# Patient Record
Sex: Female | Born: 1955
Health system: Southern US, Community
[De-identification: ages and names within clinical notes are randomized; demographics above are authoritative.]

## PROBLEM LIST (undated history)

## (undated) DIAGNOSIS — Z8739 Personal history of other diseases of the musculoskeletal system and connective tissue: Secondary | ICD-10-CM

## (undated) DIAGNOSIS — I110 Hypertensive heart disease with heart failure: Secondary | ICD-10-CM

## (undated) DIAGNOSIS — J45909 Unspecified asthma, uncomplicated: Secondary | ICD-10-CM

## (undated) DIAGNOSIS — I471 Supraventricular tachycardia, unspecified: Secondary | ICD-10-CM

## (undated) DIAGNOSIS — K648 Other hemorrhoids: Secondary | ICD-10-CM

## (undated) DIAGNOSIS — G4733 Obstructive sleep apnea (adult) (pediatric): Secondary | ICD-10-CM

## (undated) DIAGNOSIS — R0600 Dyspnea, unspecified: Secondary | ICD-10-CM

## (undated) DIAGNOSIS — E119 Type 2 diabetes mellitus without complications: Secondary | ICD-10-CM

## (undated) DIAGNOSIS — M1990 Unspecified osteoarthritis, unspecified site: Secondary | ICD-10-CM

## (undated) DIAGNOSIS — I517 Cardiomegaly: Secondary | ICD-10-CM

## (undated) DIAGNOSIS — I2510 Atherosclerotic heart disease of native coronary artery without angina pectoris: Secondary | ICD-10-CM

## (undated) DIAGNOSIS — N200 Calculus of kidney: Secondary | ICD-10-CM

## (undated) DIAGNOSIS — I272 Pulmonary hypertension, unspecified: Secondary | ICD-10-CM

## (undated) DIAGNOSIS — F419 Anxiety disorder, unspecified: Secondary | ICD-10-CM

## (undated) DIAGNOSIS — G9600 Cerebrospinal fluid leak, unspecified: Secondary | ICD-10-CM

## (undated) DIAGNOSIS — I5032 Chronic diastolic (congestive) heart failure: Secondary | ICD-10-CM

## (undated) DIAGNOSIS — J449 Chronic obstructive pulmonary disease, unspecified: Secondary | ICD-10-CM

## (undated) DIAGNOSIS — E785 Hyperlipidemia, unspecified: Secondary | ICD-10-CM

## (undated) DIAGNOSIS — F40240 Claustrophobia: Secondary | ICD-10-CM

## (undated) DIAGNOSIS — I779 Disorder of arteries and arterioles, unspecified: Secondary | ICD-10-CM

## (undated) DIAGNOSIS — I739 Peripheral vascular disease, unspecified: Secondary | ICD-10-CM

## (undated) DIAGNOSIS — K5790 Diverticulosis of intestine, part unspecified, without perforation or abscess without bleeding: Secondary | ICD-10-CM

## (undated) DIAGNOSIS — Z8673 Personal history of transient ischemic attack (TIA), and cerebral infarction without residual deficits: Secondary | ICD-10-CM

## (undated) HISTORY — DX: Peripheral vascular disease, unspecified: I73.9

## (undated) HISTORY — DX: Other hemorrhoids: K64.8

## (undated) HISTORY — DX: Supraventricular tachycardia: I47.1

## (undated) HISTORY — DX: Disorder of arteries and arterioles, unspecified: I77.9

## (undated) HISTORY — DX: Hypertensive heart disease with heart failure: I11.0

## (undated) HISTORY — DX: Chronic diastolic (congestive) heart failure: I50.32

## (undated) HISTORY — DX: Supraventricular tachycardia, unspecified: I47.10

## (undated) HISTORY — DX: Pulmonary hypertension, unspecified: I27.20

## (undated) HISTORY — DX: Diverticulosis of intestine, part unspecified, without perforation or abscess without bleeding: K57.90

## (undated) HISTORY — DX: Atherosclerotic heart disease of native coronary artery without angina pectoris: I25.10

## (undated) HISTORY — DX: Cardiomegaly: I51.7

## (undated) HISTORY — DX: Cerebrospinal fluid leak, unspecified: G96.00

## (undated) HISTORY — DX: Cerebrospinal fluid leak: G96.0

## (undated) HISTORY — DX: Personal history of transient ischemic attack (TIA), and cerebral infarction without residual deficits: Z86.73

## (undated) HISTORY — PX: REFRACTIVE SURGERY: SHX103

---

## 1992-12-08 HISTORY — PX: TOTAL ABDOMINAL HYSTERECTOMY: SHX209

## 2000-12-17 ENCOUNTER — Encounter: Payer: Self-pay | Admitting: Emergency Medicine

## 2000-12-17 ENCOUNTER — Emergency Department (HOSPITAL_COMMUNITY): Admission: EM | Admit: 2000-12-17 | Discharge: 2000-12-17 | Payer: Self-pay | Admitting: Emergency Medicine

## 2002-07-20 ENCOUNTER — Encounter: Payer: Self-pay | Admitting: Emergency Medicine

## 2002-07-20 ENCOUNTER — Emergency Department (HOSPITAL_COMMUNITY): Admission: EM | Admit: 2002-07-20 | Discharge: 2002-07-20 | Payer: Self-pay | Admitting: Emergency Medicine

## 2004-03-08 DIAGNOSIS — Z8673 Personal history of transient ischemic attack (TIA), and cerebral infarction without residual deficits: Secondary | ICD-10-CM

## 2004-03-08 HISTORY — DX: Personal history of transient ischemic attack (TIA), and cerebral infarction without residual deficits: Z86.73

## 2004-04-01 ENCOUNTER — Inpatient Hospital Stay (HOSPITAL_COMMUNITY): Admission: EM | Admit: 2004-04-01 | Discharge: 2004-04-03 | Payer: Self-pay | Admitting: Neurology

## 2004-04-02 ENCOUNTER — Encounter (INDEPENDENT_AMBULATORY_CARE_PROVIDER_SITE_OTHER): Payer: Self-pay | Admitting: Cardiology

## 2004-04-22 ENCOUNTER — Encounter: Admission: RE | Admit: 2004-04-22 | Discharge: 2004-07-21 | Payer: Self-pay | Admitting: Neurology

## 2004-04-23 ENCOUNTER — Emergency Department (HOSPITAL_COMMUNITY): Admission: EM | Admit: 2004-04-23 | Discharge: 2004-04-23 | Payer: Self-pay | Admitting: Family Medicine

## 2004-07-26 ENCOUNTER — Inpatient Hospital Stay (HOSPITAL_COMMUNITY): Admission: AD | Admit: 2004-07-26 | Discharge: 2004-07-29 | Payer: Self-pay | Admitting: Family Medicine

## 2004-12-11 ENCOUNTER — Ambulatory Visit: Payer: Self-pay | Admitting: Family Medicine

## 2005-01-07 ENCOUNTER — Ambulatory Visit: Payer: Self-pay | Admitting: Family Medicine

## 2006-08-05 ENCOUNTER — Ambulatory Visit: Payer: Self-pay | Admitting: Internal Medicine

## 2006-08-07 ENCOUNTER — Ambulatory Visit: Payer: Self-pay | Admitting: Hospitalist

## 2006-08-19 ENCOUNTER — Ambulatory Visit: Payer: Self-pay | Admitting: Internal Medicine

## 2006-09-10 ENCOUNTER — Ambulatory Visit: Payer: Self-pay | Admitting: Internal Medicine

## 2008-12-18 ENCOUNTER — Inpatient Hospital Stay (HOSPITAL_COMMUNITY): Admission: EM | Admit: 2008-12-18 | Discharge: 2008-12-23 | Payer: Self-pay | Admitting: Emergency Medicine

## 2010-03-05 ENCOUNTER — Emergency Department (HOSPITAL_COMMUNITY): Admission: EM | Admit: 2010-03-05 | Discharge: 2010-03-05 | Payer: Self-pay | Admitting: Emergency Medicine

## 2010-07-04 ENCOUNTER — Inpatient Hospital Stay (HOSPITAL_COMMUNITY): Admission: EM | Admit: 2010-07-04 | Discharge: 2010-07-10 | Payer: Self-pay | Admitting: Emergency Medicine

## 2010-07-04 ENCOUNTER — Ambulatory Visit: Payer: Self-pay | Admitting: Cardiovascular Disease

## 2010-07-05 ENCOUNTER — Encounter (INDEPENDENT_AMBULATORY_CARE_PROVIDER_SITE_OTHER): Payer: Self-pay | Admitting: Family Medicine

## 2011-02-21 LAB — BASIC METABOLIC PANEL
BUN: 9 mg/dL (ref 6–23)
CO2: 36 mEq/L — ABNORMAL HIGH (ref 19–32)
Calcium: 8.7 mg/dL (ref 8.4–10.5)
Chloride: 104 mEq/L (ref 96–112)
Creatinine, Ser: 1.19 mg/dL (ref 0.4–1.2)
GFR calc Af Amer: 57 mL/min — ABNORMAL LOW (ref >60)
GFR calc non Af Amer: 47 mL/min — ABNORMAL LOW (ref >60)
Glucose, Bld: 109 mg/dL — ABNORMAL HIGH (ref 70–99)
Potassium: 3.9 mEq/L (ref 3.5–5.1)
Sodium: 146 mEq/L — ABNORMAL HIGH (ref 135–145)

## 2011-02-21 LAB — CLOSTRIDIUM DIFFICILE EIA: C difficile Toxins A+B, EIA: NEGATIVE

## 2011-02-22 LAB — GIARDIA/CRYPTOSPORIDIUM SCREEN(EIA)
Cryptosporidium Screen (EIA): NEGATIVE
Giardia Screen - EIA: NEGATIVE

## 2011-02-22 LAB — COMPREHENSIVE METABOLIC PANEL
ALT: 12 U/L (ref 0–35)
ALT: 14 U/L (ref 0–35)
ALT: 21 U/L (ref 0–35)
AST: 22 U/L (ref 0–37)
AST: 22 U/L (ref 0–37)
AST: 31 U/L (ref 0–37)
Albumin: 3 g/dL — ABNORMAL LOW (ref 3.5–5.2)
Albumin: 3.3 g/dL — ABNORMAL LOW (ref 3.5–5.2)
Albumin: 3.8 g/dL (ref 3.5–5.2)
Alkaline Phosphatase: 63 U/L (ref 39–117)
Alkaline Phosphatase: 86 U/L (ref 39–117)
Alkaline Phosphatase: 88 U/L (ref 39–117)
BUN: 13 mg/dL (ref 6–23)
BUN: 14 mg/dL (ref 6–23)
BUN: 15 mg/dL (ref 6–23)
CO2: 26 mEq/L (ref 19–32)
CO2: 26 mEq/L (ref 19–32)
CO2: 28 mEq/L (ref 19–32)
Calcium: 7.6 mg/dL — ABNORMAL LOW (ref 8.4–10.5)
Calcium: 8.3 mg/dL — ABNORMAL LOW (ref 8.4–10.5)
Calcium: 8.7 mg/dL (ref 8.4–10.5)
Chloride: 104 mEq/L (ref 96–112)
Chloride: 104 mEq/L (ref 96–112)
Chloride: 99 mEq/L (ref 96–112)
Creatinine, Ser: 1.4 mg/dL — ABNORMAL HIGH (ref 0.4–1.2)
Creatinine, Ser: 1.5 mg/dL — ABNORMAL HIGH (ref 0.4–1.2)
Creatinine, Ser: 1.7 mg/dL — ABNORMAL HIGH (ref 0.4–1.2)
GFR calc Af Amer: 38 mL/min — ABNORMAL LOW (ref >60)
GFR calc Af Amer: 44 mL/min — ABNORMAL LOW (ref >60)
GFR calc Af Amer: 48 mL/min — ABNORMAL LOW (ref >60)
GFR calc non Af Amer: 31 mL/min — ABNORMAL LOW (ref >60)
GFR calc non Af Amer: 36 mL/min — ABNORMAL LOW (ref >60)
GFR calc non Af Amer: 39 mL/min — ABNORMAL LOW (ref >60)
Glucose, Bld: 108 mg/dL — ABNORMAL HIGH (ref 70–99)
Glucose, Bld: 124 mg/dL — ABNORMAL HIGH (ref 70–99)
Glucose, Bld: 153 mg/dL — ABNORMAL HIGH (ref 70–99)
Potassium: 3.5 mEq/L (ref 3.5–5.1)
Potassium: 4.6 mEq/L (ref 3.5–5.1)
Potassium: 4.7 mEq/L (ref 3.5–5.1)
Sodium: 135 mEq/L (ref 135–145)
Sodium: 137 mEq/L (ref 135–145)
Sodium: 137 mEq/L (ref 135–145)
Total Bilirubin: 1.2 mg/dL (ref 0.3–1.2)
Total Bilirubin: 1.2 mg/dL (ref 0.3–1.2)
Total Bilirubin: 1.7 mg/dL — ABNORMAL HIGH (ref 0.3–1.2)
Total Protein: 5.6 g/dL — ABNORMAL LOW (ref 6.0–8.3)
Total Protein: 6.4 g/dL (ref 6.0–8.3)
Total Protein: 7.1 g/dL (ref 6.0–8.3)

## 2011-02-22 LAB — CBC
HCT: 40.4 % (ref 36.0–46.0)
HCT: 43.2 % (ref 36.0–46.0)
HCT: 43.3 % (ref 36.0–46.0)
HCT: 48.1 % — ABNORMAL HIGH (ref 36.0–46.0)
Hemoglobin: 14 g/dL (ref 12.0–15.0)
Hemoglobin: 14.6 g/dL (ref 12.0–15.0)
Hemoglobin: 14.9 g/dL (ref 12.0–15.0)
Hemoglobin: 16.6 g/dL — ABNORMAL HIGH (ref 12.0–15.0)
MCH: 32.3 pg (ref 26.0–34.0)
MCH: 32.3 pg (ref 26.0–34.0)
MCH: 32.4 pg (ref 26.0–34.0)
MCH: 32.6 pg (ref 26.0–34.0)
MCHC: 33.9 g/dL (ref 30.0–36.0)
MCHC: 34.4 g/dL (ref 30.0–36.0)
MCHC: 34.5 g/dL (ref 30.0–36.0)
MCHC: 34.5 g/dL (ref 30.0–36.0)
MCV: 93.5 fL (ref 78.0–100.0)
MCV: 94.1 fL (ref 78.0–100.0)
MCV: 94.4 fL (ref 78.0–100.0)
MCV: 95.7 fL (ref 78.0–100.0)
Platelets: 198 10*3/uL (ref 150–400)
Platelets: 220 10*3/uL (ref 150–400)
Platelets: 229 10*3/uL (ref 150–400)
Platelets: 241 10*3/uL (ref 150–400)
RBC: 4.28 MIL/uL (ref 3.87–5.11)
RBC: 4.51 MIL/uL (ref 3.87–5.11)
RBC: 4.6 MIL/uL (ref 3.87–5.11)
RBC: 5.14 MIL/uL — ABNORMAL HIGH (ref 3.87–5.11)
RDW: 14.8 % (ref 11.5–15.5)
RDW: 14.9 % (ref 11.5–15.5)
RDW: 15.2 % (ref 11.5–15.5)
RDW: 15.5 % (ref 11.5–15.5)
WBC: 10.7 10*3/uL — ABNORMAL HIGH (ref 4.0–10.5)
WBC: 11 10*3/uL — ABNORMAL HIGH (ref 4.0–10.5)
WBC: 11.4 10*3/uL — ABNORMAL HIGH (ref 4.0–10.5)
WBC: 15.6 10*3/uL — ABNORMAL HIGH (ref 4.0–10.5)

## 2011-02-22 LAB — URINALYSIS, ROUTINE W REFLEX MICROSCOPIC
Glucose, UA: NEGATIVE mg/dL
Glucose, UA: NEGATIVE mg/dL
Hgb urine dipstick: NEGATIVE
Hgb urine dipstick: NEGATIVE
Ketones, ur: 15 mg/dL — AB
Ketones, ur: NEGATIVE mg/dL
Nitrite: NEGATIVE
Nitrite: NEGATIVE
Protein, ur: 100 mg/dL — AB
Protein, ur: 30 mg/dL — AB
Specific Gravity, Urine: 1.028 (ref 1.005–1.030)
Specific Gravity, Urine: 1.032 — ABNORMAL HIGH (ref 1.005–1.030)
Urobilinogen, UA: 1 mg/dL (ref 0.0–1.0)
Urobilinogen, UA: 2 mg/dL — ABNORMAL HIGH (ref 0.0–1.0)
pH: 5 (ref 5.0–8.0)
pH: 5 (ref 5.0–8.0)

## 2011-02-22 LAB — BRAIN NATRIURETIC PEPTIDE: Pro B Natriuretic peptide (BNP): 134 pg/mL — ABNORMAL HIGH (ref 0.0–100.0)

## 2011-02-22 LAB — CARDIAC PANEL(CRET KIN+CKTOT+MB+TROPI)
CK, MB: 3.3 ng/mL (ref 0.3–4.0)
CK, MB: 3.5 ng/mL (ref 0.3–4.0)
CK, MB: 3.8 ng/mL (ref 0.3–4.0)
Relative Index: 2.6 — ABNORMAL HIGH (ref 0.0–2.5)
Relative Index: 2.9 — ABNORMAL HIGH (ref 0.0–2.5)
Relative Index: 3 — ABNORMAL HIGH (ref 0.0–2.5)
Total CK: 121 U/L (ref 7–177)
Total CK: 125 U/L (ref 7–177)
Total CK: 126 U/L (ref 7–177)
Troponin I: 0.09 ng/mL — ABNORMAL HIGH (ref 0.00–0.06)
Troponin I: 0.1 ng/mL — ABNORMAL HIGH (ref 0.00–0.06)
Troponin I: 0.1 ng/mL — ABNORMAL HIGH (ref 0.00–0.06)

## 2011-02-22 LAB — RAPID URINE DRUG SCREEN, HOSP PERFORMED
Amphetamines: NOT DETECTED
Barbiturates: NOT DETECTED
Benzodiazepines: NOT DETECTED
Cocaine: POSITIVE — AB
Opiates: POSITIVE — AB

## 2011-02-22 LAB — CLOSTRIDIUM DIFFICILE EIA
C difficile Toxins A+B, EIA: NEGATIVE
C difficile Toxins A+B, EIA: NEGATIVE

## 2011-02-22 LAB — LIPID PANEL
Cholesterol: 192 mg/dL (ref 0–200)
HDL: 44 mg/dL (ref >39)
LDL Cholesterol: 127 mg/dL — ABNORMAL HIGH (ref 0–99)
Total CHOL/HDL Ratio: 4.4 RATIO
Triglycerides: 104 mg/dL (ref <150)
VLDL: 21 mg/dL (ref 0–40)

## 2011-02-22 LAB — DIFFERENTIAL
Basophils Absolute: 0 10*3/uL (ref 0.0–0.1)
Basophils Relative: 0 % (ref 0–1)
Eosinophils Absolute: 0.1 10*3/uL (ref 0.0–0.7)
Eosinophils Relative: 1 % (ref 0–5)
Lymphocytes Relative: 9 % — ABNORMAL LOW (ref 12–46)
Lymphs Abs: 1.4 10*3/uL (ref 0.7–4.0)
Monocytes Absolute: 0.5 10*3/uL (ref 0.1–1.0)
Monocytes Relative: 3 % (ref 3–12)
Neutro Abs: 13.6 10*3/uL — ABNORMAL HIGH (ref 1.7–7.7)
Neutrophils Relative %: 87 % — ABNORMAL HIGH (ref 43–77)

## 2011-02-22 LAB — URINE CULTURE: Colony Count: 50000

## 2011-02-22 LAB — HEMOGLOBIN A1C
Hgb A1c MFr Bld: 6.1 % — ABNORMAL HIGH (ref <5.7)
Mean Plasma Glucose: 128 mg/dL — ABNORMAL HIGH (ref <117)

## 2011-02-22 LAB — STOOL CULTURE

## 2011-02-22 LAB — BASIC METABOLIC PANEL
BUN: 7 mg/dL (ref 6–23)
CO2: 27 mEq/L (ref 19–32)
Calcium: 8.2 mg/dL — ABNORMAL LOW (ref 8.4–10.5)
Chloride: 102 mEq/L (ref 96–112)
Creatinine, Ser: 1.19 mg/dL (ref 0.4–1.2)
GFR calc Af Amer: 57 mL/min — ABNORMAL LOW (ref >60)
GFR calc non Af Amer: 47 mL/min — ABNORMAL LOW (ref >60)
Glucose, Bld: 122 mg/dL — ABNORMAL HIGH (ref 70–99)
Potassium: 3.5 mEq/L (ref 3.5–5.1)
Sodium: 140 mEq/L (ref 135–145)

## 2011-02-22 LAB — URINE MICROSCOPIC-ADD ON

## 2011-02-22 LAB — LIPASE, BLOOD: Lipase: 30 U/L (ref 11–59)

## 2011-02-22 LAB — TSH: TSH: 7.749 u[IU]/mL — ABNORMAL HIGH (ref 0.350–4.500)

## 2011-03-24 LAB — GLUCOSE, CAPILLARY
Glucose-Capillary: 102 mg/dL — ABNORMAL HIGH (ref 70–99)
Glucose-Capillary: 104 mg/dL — ABNORMAL HIGH (ref 70–99)
Glucose-Capillary: 110 mg/dL — ABNORMAL HIGH (ref 70–99)
Glucose-Capillary: 111 mg/dL — ABNORMAL HIGH (ref 70–99)
Glucose-Capillary: 113 mg/dL — ABNORMAL HIGH (ref 70–99)
Glucose-Capillary: 117 mg/dL — ABNORMAL HIGH (ref 70–99)
Glucose-Capillary: 123 mg/dL — ABNORMAL HIGH (ref 70–99)
Glucose-Capillary: 125 mg/dL — ABNORMAL HIGH (ref 70–99)
Glucose-Capillary: 131 mg/dL — ABNORMAL HIGH (ref 70–99)
Glucose-Capillary: 133 mg/dL — ABNORMAL HIGH (ref 70–99)
Glucose-Capillary: 134 mg/dL — ABNORMAL HIGH (ref 70–99)
Glucose-Capillary: 140 mg/dL — ABNORMAL HIGH (ref 70–99)
Glucose-Capillary: 144 mg/dL — ABNORMAL HIGH (ref 70–99)
Glucose-Capillary: 153 mg/dL — ABNORMAL HIGH (ref 70–99)
Glucose-Capillary: 153 mg/dL — ABNORMAL HIGH (ref 70–99)
Glucose-Capillary: 158 mg/dL — ABNORMAL HIGH (ref 70–99)
Glucose-Capillary: 159 mg/dL — ABNORMAL HIGH (ref 70–99)
Glucose-Capillary: 159 mg/dL — ABNORMAL HIGH (ref 70–99)
Glucose-Capillary: 163 mg/dL — ABNORMAL HIGH (ref 70–99)
Glucose-Capillary: 168 mg/dL — ABNORMAL HIGH (ref 70–99)
Glucose-Capillary: 222 mg/dL — ABNORMAL HIGH (ref 70–99)
Glucose-Capillary: 80 mg/dL (ref 70–99)
Glucose-Capillary: 99 mg/dL (ref 70–99)
Glucose-Capillary: 103 mg/dL — ABNORMAL HIGH (ref 70–99)
Glucose-Capillary: 117 mg/dL — ABNORMAL HIGH (ref 70–99)
Glucose-Capillary: 146 mg/dL — ABNORMAL HIGH (ref 70–99)
Glucose-Capillary: 148 mg/dL — ABNORMAL HIGH (ref 70–99)

## 2011-03-24 LAB — URINALYSIS, ROUTINE W REFLEX MICROSCOPIC
Glucose, UA: NEGATIVE mg/dL
Hgb urine dipstick: NEGATIVE
Nitrite: NEGATIVE
Protein, ur: 30 mg/dL — AB
Specific Gravity, Urine: 1.019 (ref 1.005–1.030)
Urobilinogen, UA: 1 mg/dL (ref 0.0–1.0)
pH: 5.5 (ref 5.0–8.0)

## 2011-03-24 LAB — CBC
HCT: 42.6 % (ref 36.0–46.0)
HCT: 43.4 % (ref 36.0–46.0)
HCT: 48.3 % — ABNORMAL HIGH (ref 36.0–46.0)
HCT: 49.1 % — ABNORMAL HIGH (ref 36.0–46.0)
Hemoglobin: 14.3 g/dL (ref 12.0–15.0)
Hemoglobin: 14.5 g/dL (ref 12.0–15.0)
Hemoglobin: 16.5 g/dL — ABNORMAL HIGH (ref 12.0–15.0)
Hemoglobin: 16.8 g/dL — ABNORMAL HIGH (ref 12.0–15.0)
MCHC: 33.5 g/dL (ref 30.0–36.0)
MCHC: 33.6 g/dL (ref 30.0–36.0)
MCHC: 34.1 g/dL (ref 30.0–36.0)
MCHC: 34.1 g/dL (ref 30.0–36.0)
MCV: 94 fL (ref 78.0–100.0)
MCV: 94.3 fL (ref 78.0–100.0)
MCV: 95.3 fL (ref 78.0–100.0)
MCV: 95.5 fL (ref 78.0–100.0)
Platelets: 154 10*3/uL (ref 150–400)
Platelets: 184 10*3/uL (ref 150–400)
Platelets: 190 10*3/uL (ref 150–400)
Platelets: 212 10*3/uL (ref 150–400)
RBC: 4.51 MIL/uL (ref 3.87–5.11)
RBC: 4.55 MIL/uL (ref 3.87–5.11)
RBC: 5.14 MIL/uL — ABNORMAL HIGH (ref 3.87–5.11)
RBC: 5.14 MIL/uL — ABNORMAL HIGH (ref 3.87–5.11)
RDW: 14.6 % (ref 11.5–15.5)
RDW: 14.8 % (ref 11.5–15.5)
RDW: 14.8 % (ref 11.5–15.5)
RDW: 15.2 % (ref 11.5–15.5)
WBC: 10.2 10*3/uL (ref 4.0–10.5)
WBC: 13.1 10*3/uL — ABNORMAL HIGH (ref 4.0–10.5)
WBC: 13.7 10*3/uL — ABNORMAL HIGH (ref 4.0–10.5)
WBC: 17.8 10*3/uL — ABNORMAL HIGH (ref 4.0–10.5)

## 2011-03-24 LAB — DIFFERENTIAL
Basophils Absolute: 0 10*3/uL (ref 0.0–0.1)
Basophils Absolute: 0 10*3/uL (ref 0.0–0.1)
Basophils Relative: 0 % (ref 0–1)
Basophils Relative: 0 % (ref 0–1)
Eosinophils Absolute: 0 10*3/uL (ref 0.0–0.7)
Eosinophils Absolute: 0 10*3/uL (ref 0.0–0.7)
Eosinophils Relative: 0 % (ref 0–5)
Eosinophils Relative: 0 % (ref 0–5)
Lymphocytes Relative: 11 % — ABNORMAL LOW (ref 12–46)
Lymphocytes Relative: 7 % — ABNORMAL LOW (ref 12–46)
Lymphs Abs: 1.2 10*3/uL (ref 0.7–4.0)
Lymphs Abs: 1.5 10*3/uL (ref 0.7–4.0)
Monocytes Absolute: 0.5 10*3/uL (ref 0.1–1.0)
Monocytes Absolute: 0.7 10*3/uL (ref 0.1–1.0)
Monocytes Relative: 3 % (ref 3–12)
Monocytes Relative: 5 % (ref 3–12)
Neutro Abs: 11.4 10*3/uL — ABNORMAL HIGH (ref 1.7–7.7)
Neutro Abs: 16 10*3/uL — ABNORMAL HIGH (ref 1.7–7.7)
Neutrophils Relative %: 83 % — ABNORMAL HIGH (ref 43–77)
Neutrophils Relative %: 90 % — ABNORMAL HIGH (ref 43–77)

## 2011-03-24 LAB — COMPREHENSIVE METABOLIC PANEL
ALT: 16 U/L (ref 0–35)
ALT: 9 U/L (ref 0–35)
AST: 12 U/L (ref 0–37)
AST: 18 U/L (ref 0–37)
Albumin: 2.8 g/dL — ABNORMAL LOW (ref 3.5–5.2)
Albumin: 3.5 g/dL (ref 3.5–5.2)
Alkaline Phosphatase: 72 U/L (ref 39–117)
Alkaline Phosphatase: 82 U/L (ref 39–117)
BUN: 8 mg/dL (ref 6–23)
BUN: 9 mg/dL (ref 6–23)
CO2: 26 mEq/L (ref 19–32)
CO2: 26 mEq/L (ref 19–32)
Calcium: 7.9 mg/dL — ABNORMAL LOW (ref 8.4–10.5)
Calcium: 8.4 mg/dL (ref 8.4–10.5)
Chloride: 104 mEq/L (ref 96–112)
Chloride: 104 mEq/L (ref 96–112)
Creatinine, Ser: 1.2 mg/dL (ref 0.4–1.2)
Creatinine, Ser: 1.47 mg/dL — ABNORMAL HIGH (ref 0.4–1.2)
GFR calc Af Amer: 45 mL/min — ABNORMAL LOW (ref >60)
GFR calc Af Amer: 57 mL/min — ABNORMAL LOW (ref >60)
GFR calc non Af Amer: 37 mL/min — ABNORMAL LOW (ref >60)
GFR calc non Af Amer: 47 mL/min — ABNORMAL LOW (ref >60)
Glucose, Bld: 133 mg/dL — ABNORMAL HIGH (ref 70–99)
Glucose, Bld: 153 mg/dL — ABNORMAL HIGH (ref 70–99)
Potassium: 3.4 mEq/L — ABNORMAL LOW (ref 3.5–5.1)
Potassium: 3.9 mEq/L (ref 3.5–5.1)
Sodium: 137 mEq/L (ref 135–145)
Sodium: 138 mEq/L (ref 135–145)
Total Bilirubin: 1.3 mg/dL — ABNORMAL HIGH (ref 0.3–1.2)
Total Bilirubin: 1.5 mg/dL — ABNORMAL HIGH (ref 0.3–1.2)
Total Protein: 5.7 g/dL — ABNORMAL LOW (ref 6.0–8.3)
Total Protein: 6.4 g/dL (ref 6.0–8.3)

## 2011-03-24 LAB — BASIC METABOLIC PANEL
BUN: 12 mg/dL (ref 6–23)
BUN: 7 mg/dL (ref 6–23)
CO2: 25 mEq/L (ref 19–32)
CO2: 27 mEq/L (ref 19–32)
Calcium: 8.2 mg/dL — ABNORMAL LOW (ref 8.4–10.5)
Calcium: 8.3 mg/dL — ABNORMAL LOW (ref 8.4–10.5)
Chloride: 100 mEq/L (ref 96–112)
Chloride: 103 mEq/L (ref 96–112)
Creatinine, Ser: 1.14 mg/dL (ref 0.4–1.2)
Creatinine, Ser: 1.52 mg/dL — ABNORMAL HIGH (ref 0.4–1.2)
GFR calc Af Amer: 44 mL/min — ABNORMAL LOW (ref >60)
GFR calc Af Amer: >60 mL/min (ref >60)
GFR calc non Af Amer: 36 mL/min — ABNORMAL LOW (ref >60)
GFR calc non Af Amer: 50 mL/min — ABNORMAL LOW (ref >60)
Glucose, Bld: 106 mg/dL — ABNORMAL HIGH (ref 70–99)
Glucose, Bld: 106 mg/dL — ABNORMAL HIGH (ref 70–99)
Potassium: 3.3 mEq/L — ABNORMAL LOW (ref 3.5–5.1)
Potassium: 3.6 mEq/L (ref 3.5–5.1)
Sodium: 136 mEq/L (ref 135–145)
Sodium: 138 mEq/L (ref 135–145)

## 2011-03-24 LAB — CULTURE, BLOOD (ROUTINE X 2)
Culture: NO GROWTH
Culture: NO GROWTH

## 2011-03-24 LAB — URINE CULTURE: Colony Count: 80000

## 2011-03-24 LAB — URINE MICROSCOPIC-ADD ON

## 2011-03-24 LAB — BLOOD GAS, ARTERIAL
Acid-base deficit: 0.8 mmol/L (ref 0.0–2.0)
Bicarbonate: 24.9 mEq/L — ABNORMAL HIGH (ref 20.0–24.0)
Drawn by: 232811
O2 Content: 2 L/min
O2 Saturation: 94.9 %
Patient temperature: 98.6
TCO2: 22.2 mmol/L (ref 0–100)
pCO2 arterial: 47.6 mmHg — ABNORMAL HIGH (ref 35.0–45.0)
pH, Arterial: 7.339 — ABNORMAL LOW (ref 7.350–7.400)
pO2, Arterial: 76 mmHg — ABNORMAL LOW (ref 80.0–100.0)

## 2011-03-24 LAB — LIPASE, BLOOD: Lipase: 17 U/L (ref 11–59)

## 2011-03-24 LAB — TSH: TSH: 6.267 u[IU]/mL — ABNORMAL HIGH (ref 0.350–4.500)

## 2011-03-24 LAB — HEMOGLOBIN A1C
Hgb A1c MFr Bld: 5.8 % (ref 4.6–6.1)
Mean Plasma Glucose: 120 mg/dL

## 2011-03-24 LAB — T4, FREE: Free T4: 0.86 ng/dL — ABNORMAL LOW (ref 0.89–1.80)

## 2011-03-24 LAB — MAGNESIUM: Magnesium: 2.3 mg/dL (ref 1.5–2.5)

## 2011-04-22 NOTE — Discharge Summary (Signed)
NAME:  [name], [name]            ACCOUNT NO.:  [id number]   MEDICAL RECORD NO.:  [id number]          PATIENT TYPE:  INP   LOCATION:  1310                         FACILITY:  [facility]   PHYSICIAN:  Barbette Merino, M.D.      DATE OF BIRTH:  May 20, 1956   DATE OF ADMISSION:  12/18/2008  DATE OF DISCHARGE:  12/23/2008                               DISCHARGE SUMMARY   PRIMARY CARE PHYSICIAN:  She is undecided but she will follow up with me  in the office.   DISCHARGE DIAGNOSES:  1. Acute sigmoid diverticulitis.  2. Urinary tract infection.  3. Nephrolithiasis.  4. Transient hypertension.  5. Transient hypoxia.  6. Morbid obesity.  7. Acute renal failure secondary to dehydration.  8. Hypothyroidism.  9. Transient hypokalemia and dehydration.   DISCHARGE MEDICATIONS:  1. Synthroid 25 mcg daily.  2. Vicodin 5/500 one tablet q.6 h. p.r.n.  3. Flagyl 500 mg p.o. t.i.d. for a week.  4. Ciprofloxacin 500 mg p.o. b.i.d. for 1 week.   DISPOSITION:  The patient will follow up with me in the office.  She is  to complete 7 days of antibiotics at home.  She is also going to apply  some dietary changes including avoiding a lot of nuts and taking high  fiber diets.   PROCEDURES PERFORMED ON THIS ADMISSION:  CT abdomen and pelvis on  January 11 that showed right nephrolithiasis without hydronephrosis or  ureterectasis.  Also sigmoid diverticulitis without abscess.  The chest  x-ray on January 13 showed no acute process mainly cardiomegaly, no CHF.  Noticed minimal pleural fluid or minimal density in the right  cardiophrenic angle.   CONSULTATIONS:  None.   BRIEF HISTORY AND PHYSICAL:  Please refer to dictated history and  physical on admission by Dr. Jana Hakim.  In short, however, this  is a 55 year old morbidly obese female presented with severe lower  quadrant abdominal pain.  The patient apparently received gifts of  multiple packages of nuts, peanuts over the holidays and ate a lot  of  that.  She has been doing well prior to that event, started developing  the abdominal pain mainly in the left lower quadrant which was severe.  She came to the emergency room with some nausea and vomiting.  She has  also had some chills.  Her workup showed no fever but she did have some  mild abdominal tenderness and further evaluation showed she has also had  some elements of UTI and diverticulitis.  She was subsequently admitted  for further management.   HOSPITAL COURSE:  1. Acute sigmoid diverticulitis.  The patient was started on bowel      rest, IV Cipro and Flagyl.  She was also hydrated effectively.  She      seems to have responded to treatment over time and her diet has now      gradually advanced to regular diet.  She was currently stable and      ready for discharge.  2. UTI.  Again, her urine cultures grew E. coli about 80,000 colonies.      She has been  on ciprofloxacin which should take care of both and      this equalizes to Cipro anyway, so she will complete treatment for      her diverticulitis which will also treat the UTI.  3. Hypertension.  This patient has history of hypertension but has not      been on any medications.  Her blood pressures have remained      somewhere around 114 in the hospital.  We will follow up with her      as an outpatient and start her on antihypertensives as needed.  4. Acute renal failure.  Her BUN and creatinine were elevated with a      creatinine of 1.47 when she was admitted most likely secondary to      dehydration.  At the same time, her hemoglobin was 16.5 which has      also improved with hydration.  5. Hypothyroidism.  The patient's TSH was very much elevated on      admission at 6.26.  Further testing includes free T4 was mildly      low.  The patient was started on Synthroid on this admission and      have a repeat TSH check in about 4 to 6 weeks.  Otherwise, the      patient appears stable enough for discharge and will  proceed with      discharge accordingly.      Barbette Merino, M.D.  Electronically Signed     LG/MEDQ  D:  12/23/2008  T:  12/24/2008  Job:  [id number]

## 2011-04-22 NOTE — H&P (Signed)
NAME:  [name], [name]            ACCOUNT NO.:  [id number]   MEDICAL RECORD NO.:  [id number]          PATIENT TYPE:  INP   LOCATION:  [place]                         FACILITY:  [facility]   PHYSICIAN:  Jana Hakim, M.D. DATE OF BIRTH:  [birth year]   DATE OF ADMISSION:  12/18/2008  DATE OF DISCHARGE:                              HISTORY & PHYSICAL   PRIMARY CARE PHYSICIAN:  None.   CHIEF COMPLAINT:  Abdominal pain.   HISTORY OF PRESENT ILLNESS:  This is a 55 year old female who presents  to the Emergency Department with complaints of severe lower abdominal  pain for the past 4-5 days.  She reports having constant unremitting  pain, which has been crampy and sharp.  She rates the pain as being an  8/10 to 10/10 at the worst.  She reports having constipation for the  past few days, and reports also having fevers and chills.  She states  the pain is worse in the left lower quadrant area.  She denies having  any nausea or vomiting.   PAST MEDICAL HISTORY:  1. Hypertension.  2. The patient reports having a cerebrovascular accident 3-1/2 years      ago.   PAST SURGICAL HISTORY:  History of a partial hysterectomy.   MEDICATIONS:  At this time, none.   ALLERGIES:  No known drug allergies.   SOCIAL HISTORY:  The patient is a smoker, smokes 1/2-pack cigarettes  daily for 20+ years.  She is a nondrinker.   FAMILY HISTORY:  Noncontributory.   REVIEW OF SYSTEMS:  Pertinents are mentioned above.   PHYSICAL EXAMINATION FINDINGS:  GENERAL:  This is a 55 year old morbidly  obese female in discomfortbut no acute distress.  VITAL SIGNS:  Temperature 98.7, blood pressure 156/77, heart rate 92-  101, respirations 18, O2 saturations 93-100%.  HEENT: Normocephalic/atraumatic.  Pupils equally round, reactive to  light.  Extraocular movements are intact.  Funduscopic benign.  There is  no scleral icterus or injection.  Conjunctivae are without pallor.  Nares are patent bilaterally.  Oropharynx is  clear.  NECK:  Supple.  Full range of motion.  No thyromegaly, adenopathy or  jugular venous distention.  CARDIOVASCULAR:  Regular rate and rhythm.  No murmurs, gallops or rubs.  LUNGS: Clear to auscultation bilaterally.  ABDOMEN:  Positive bowel sounds, soft, mild diffuse tenderness.  No  hepatosplenomegaly.  No rebound or guarding.  EXTREMITIES:  Without cyanosis, clubbing, or edema.  NEUROLOGIC:  Nonfocal.   LABORATORY STUDIES:  White blood cell count 17.8, hemoglobin 16.5,  hematocrit 48.3, platelets 190, neutrophils 90%, lymphocytes 7%.  Sodium  138, potassium 3.4, chloride 104, bicarb 26, BUN 9, creatinine 1.47, and  glucose 153.  Albumin 3.5, AST 18, ALT 16, lipase 17.  Urinalysis with  trace leukocyte esterase, trace ketones.  Total protein 30.  A CT scan  of the abdomen and pelvis, the reading is still pending at this time.   ASSESSMENT:  A 55 year old female being admitted with:  1. Lower abdominal pain.  Possible diverticulitis.  2. Leukocytosis.  3. Hypertension.  4. Mild hypokalemia.   PLAN:  The patient will be admitted  and placed on IV antibiotic therapy  of metronidazole and Cipro.  Pain control therapy has been ordered.  The  patient will be placed on clear liquids and IV fluids.  Antiemetic  therapy has also been ordered if needed.  Further workup and  consultations will ensue pending results of the patient's CAT scan.  The  patient has been placed on DVT and GI prophylaxis.      Jana Hakim, M.D.  Electronically Signed     HJ/MEDQ  D:  12/19/2008  T:  12/19/2008  Job:  [id number]

## 2011-04-22 NOTE — H&P (Signed)
NAME:  [name], [name]            ACCOUNT NO.:  [id number]   MEDICAL RECORD NO.:  [id number]          PATIENT TYPE:  INP   LOCATION:  1310                         [facility]ACILITY:  [facility]   PHYSICIAN:  Jana Hakim, M.D. DATE OF BIRTH:  1956-03-27   DATE OF ADMISSION:  12/18/2008  DATE OF DISCHARGE:                              HISTORY & PHYSICAL   NO DICTATION      Jana Hakim, M.D.  Electronically Signed     HJ/MEDQ  D:  12/19/2008  T:  12/19/2008  Job:  [id number]

## 2011-04-25 NOTE — Discharge Summary (Signed)
NAME:  [name], [name]                      ACCOUNT NO.:  [id number]   MEDICAL RECORD NO.:  [id number]                   PATIENT TYPE:  INP   LOCATION:  3016                                 FACILITY:  [place]   PHYSICIAN:  Gwendolyn Grant, M.D. [facility]          DATE OF BIRTH:  1956-09-27   DATE OF ADMISSION:  07/26/2004  DATE OF DISCHARGE:  07/29/2004                                 DISCHARGE SUMMARY   DISCHARGE DIAGNOSES:  1. Uncontrolled hypertension, improved.  2. Hyperglycemia with normal A1c, positive family history of diabetes;     continue observation.  3. History of hyperlipidemia.  4. History of transient ischemic attack/cerebrovascular accident.  5. Tobacco abuse, encourage cessation.   DISCHARGE MEDICATIONS:  1. Tiazac 240 mg one p.o. daily.  2. Toprol-XL 50 mg p.o. daily.  3. Altace 10 mg p.o. b.i.d.  4. Baby aspirin 81 mg p.o. daily.  5. Lipitor 20 mg p.o. daily.  6. Lasix 20 mg p.o. daily.  7. K-Dur 20 mEq p.o. daily.   DISPOSITION:  The patient is discharged home with instructions to continue  medications as they have been titrated in the hospital, to watch her diet,  complying with a diabetic-type diet, and observation for development of true  type 2 diabetes.  She is also encouraged to discontinue tobacco abuse and  continue using nicotine patches as here in the hospital.   [place]:  With her primary care physician, Dr. Christie Nottingham, as  previously scheduled for Tuesday, July 30, 2004.   CONDITION ON DISCHARGE:  Medically stable.   HOSPITAL COURSE BY PROBLEM:  #1 - UNCONTROLLED HYPERTENSION.  The patient is  a 55 year old woman who has been noncompliant with her medications who  presented to her primary care physician's office for evaluation of her blood  pressure.  She had history of CVA in April 2005 but has been unable to take  her medications due to cost issues.  She now has insurance again and was  hospitalized for improved control as her  systolic pressure in the office was  [id number], diastolic [id number].  Her medications were titrated during this  hospitalization and at discharge are as reflected above.  Blood pressure at  the time of discharge is 143/80, pulse is 66.  Continue medications and  outpatient follow-up.   #2 - HYPERGLYCEMIA.  The patient was noted to have a glucose of 150 at the  time of admission.  A1c was checked and found to be normal at 5.6.  Positive  family history of diabetes with both sisters and her parents.  Recommended  no concentrated sweet diet as if she were diabetic but no medications or  insulin treatment necessary at this time.  Continue outpatient surveillance  for probable development of diabetes in the near future.   #3 - HISTORY OF HYPERLIPIDEMIA.  The patient was continued on her statin as  prior to admission.  Encourage compliance.  Fasting lipid profile as per  primary care physician.   #4 - TOBACCO ABUSE.  The patient was placed on nicotine patches during this  hospitalization and says she is committed to continuing the discontinuation  of this habit.                                                Gwendolyn Grant, M.D. [facility]    VL/MEDQ  D:  07/29/2004  T:  07/30/2004  Job:  [id number]
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
## 2011-04-25 NOTE — Consult Note (Signed)
[facility]  Patient:    [name], [name]                   MRN: [id number] Adm. Date:  [id number] Disc. Date: [id number] Attending:  Nat Christen CC:         Charisse Klinefelter, M.D.  Scharlene Gloss., M.D.  Nat Christen, M.D.   Consultation Report  REASON FOR CONSULTATION:  Chest discomfort.  SUBJECTIVE:  The patient is 74 and came to the emergency room this morning after she began having vertigo and nausea.  For the preceding three days, she had also been having a sensation of fullness in her chest.  This has been a constant discomfort, not very intense, but enough to let her know that it was present.  No significant shortness of breath or diaphoresis were associated with it.  She came to the emergency room after she began having vertigo because she was concerned that these symptoms could represent a possible heart attack.  In the emergency room, she was evaluated by Dr. Nat Christen, who became concerned about the appearance of her electrocardiogram and her symptoms and asked that I see her in consultation.  The patient has had no recent contact with her physician.  She has a history of hypertension but had stopped taking her medicines six months ago.  Her primary physician is Dr. Tempie Hoist.  Sublingual nitroglycerin was tried in the emergency room but has had no effect on the chest discomfort.  The chest discomfort continues at this time, despite clonidine that has also been used to help lower her blood pressure.  Dr. Lacinda Axon was concerned about her initial blood pressure, which was 210/115.  ALLERGIES:  None.  MEDICATIONS:  Was on Lotrel 5/20 mg but stopped taking this six months ago.  SIGNIFICANT MEDICAL PROBLEMS 1. Hysterectomy without oophorectomy. 2. History of hypertension x 2 years.  HABITS:  Smoke cigarettes, one to two packs per day.  Drinks alcohol socially.  FAMILY HISTORY:  Negative for coronary artery disease in her father,  mother and two older sisters.  SOCIAL HISTORY:  She is widowed; her husband died of a heart attack.  She is unemployed.  REVIEW OF SYSTEMS:  She has been having pain and swelling intermittently on the dorsum of her left foot.  The nausea and vertigo have been going on off and on for 24 to 36 hours.  She has had recent upper respiratory symptoms. She denies headache.  She denies back discomfort, stiff neck, motor or sensory symptoms.  She has not had nausea or vomiting.  She states that Tums have had a tendency to help the feeling of pressure that she has in her chest.  OBJECTIVE  VITAL SIGNS:  On exam, the initial blood pressure was 211/115; subsequent pressures have been documented to be in the 150-160/90 range.  I measured the blood pressure with a standard-size cuff and it was 180/90.  Heart rate 80.  SKIN:  The skin is clear.  No xanthelasma is noted.  HEENT:  Unremarkable.  NECK:  No JVD or carotid bruits are heard.  LUNGS:  Clear to auscultation and percussion.  CARDIAC:  Normal S1 and S2.  No clicks, rubs, murmurs, or gallops are heard.  ABDOMEN:  Soft.  The liver and spleen are not palpable.  The abdomen is obese.  EXTREMITIES:  No edema.  Pulses are 2+ and symmetric in the upper and lower extremities.  Pedal pulses are 2+ in the posterior tibials  and dorsalis pedis bilaterally.  There is no ankle edema.  NEUROLOGIC:  Unremarkable.  LABORATORY AND X-RAY FINDINGS:  The EKG reveals normal sinus rhythm with T wave changes in I, aVL, V5 and V6.  Q waves are noted in III.  Insignificant Q wave is noted in F.  Chest x-ray reveals normal-size heart, no evidence of CHF.  Potassium is 3.3.  The CPK-MB is 97/less than 0.3.  The troponin I is 0.03. The hemoglobin is 14.8.  Creatinine 1, BUN 9, sodium 140.  ASSESSMENT 1. Nausea and vomiting secondary to probable acute labyrinthitis. 2. Hypertension, poorly controlled due to medication noncompliance. 3. Chest discomfort of  uncertain etiology.  This is not clearly cardiac in    origin and sounds more dyspeptic, given her history of relief with ______    and the continuous nature of the discomfort; coronary disease, however,    should be considered and we will get the patient scheduled to have a stress    Cardiolite, once her blood pressure is under better control.  PLAN 1. She is discharged to follow up with Dr. Dellis Filbert Hooper/Dr. Joni Fears to    follow blood pressure. 2. They will also need to follow up on the patients vertigo, which is    probably due to labyrinthitis. 3. I prescribed Antivert for the labyrinthitis. 4. I restarted Lotrel 5/20 mg one per day for blood pressure. 5. We will call her tomorrow with arrangements to set up an exercise treadmill    test to rule out any evidence of myocardial ischemia, after blood pressure    is better controlled. DD:  12/17/00 TD:  12/18/00 Job: [id number]

## 2011-04-25 NOTE — H&P (Signed)
NAME:  [name], PANZARELLA                      ACCOUNT NO.:  [id number]   MEDICAL RECORD NO.:  [id number]                   PATIENT TYPE:  INP   LOCATION:  3016                                 FACILITY:  [place]   PHYSICIAN:  Dellis Filbert A. Sherren Mocha, M.D. [facility]           DATE OF BIRTH:  1956/11/08   DATE OF ADMISSION:  07/26/2004  DATE OF DISCHARGE:                                HISTORY & PHYSICAL   Ms. Sugarman is a 55 year old single female, gravida 0, para 0, who comes in  today as a new patient on the [employer] Savings initiate program for  evaluation.   PAST MEDICAL HISTORY:  She says she was hospitalized in April 2005 for CVA.  She still has some residual symptoms from that.  She says she has difficulty  writing with her right hand and, also, some difficulty with that right foot,  she says it still has a little foot drop.  Other than that, she feels well.  She is also status post hysterectomy in 1994, ovaries were left intact, no  cancer.  Outpatient surgeries none.  Illnesses none.   ALLERGIES:  None.   HABITS:  She smokes about a pack of cigarettes per day.  She only drinks an  occasional drink of alcohol.   CURRENT MEDICATIONS:  HCTZ 25 q.a.m., Lipitor 10 q.h.s., Lasix 10 daily, ASA  81 mg daily, and she is noncompliant with her medications.   REVIEW OF SYMPTOMS:  Negative except for above.   SOCIAL HISTORY:  She is single, lives here in [place], she is a Museum/gallery conservator, she has never been married, no kids.   FAMILY HISTORY:  See dictated note from April 2005.   PHYSICAL EXAMINATION:  VITAL SIGNS:  Weight 249 pounds, height 5 feet 5 inches, blood pressure  240/140.  She was given 0.1 Clonidine and 40 Lasix.  She was observed over a  two hour prior of time and blood pressure checked every 15 minutes.  Blood  pressure did not come down significantly, therefore, the patient was  admitted to the  hospital for further treatment.  Pulse 80 and regular.  Temperature 98.  GENERAL:   Well developed, well nourished white female in no acute distress.  HEENT:  Negative except for poor dentition.  NECK:  Supple, thyroid not enlarged, no carotid bruits.  CHEST:  Clear to auscultation.  CARDIAC:  Exam negative.  BREASTS:  Negative.  ABDOMEN:  Negative except that it is rather large.  EXTREMITIES:  2-3+ edema, normal pulses.   IMPRESSION:  Malignant hypertension.   PLAN:  1. Will admit, place at complete bedrest, start her back on her ACE     inhibitor and Lasix.  She will be monitored carefully.  IV Labetalol will     be started p.r.n.  If her blood pressure comes down, this probably will     not be necessary.  Once her blood pressure is stable, she will be  discharged.  I will need to see her the day after discharge in the     office.  We need to follow her very carefully as an outpatient to be sure     this does not happen again.  2. Status post cerebrovascular accident April 2005 from uncontrolled     hypertension.                                                Jeffrey A. Sherren Mocha, M.D. [facility]    JAT/MEDQ  D:  07/26/2004  T:  07/26/2004  Job:  [id number]

## 2011-04-25 NOTE — H&P (Signed)
NAME:  [name], [name]                      ACCOUNT NO.:  [id number]   MEDICAL RECORD NO.:  [id number]                   PATIENT TYPE:  EMS   LOCATION:  ED                                   FACILITY:  [facility]   PHYSICIAN:  Jill Alexanders, M.D.               DATE OF BIRTH:  [birth year]   DATE OF ADMISSION:  04/01/2004  DATE OF DISCHARGE:                                HISTORY & PHYSICAL   HISTORY OF PRESENT ILLNESS:  The patient is a 55 year old right-handed white  female born 1956-02-11, with a history of obesity, unrecognized  hypertension who comes to [facility] initially for  evaluation of right hemisensory deficit.  The patient began noting onset of  the deficit four days prior to this evaluation noting that numbness on the  right face.  This is associated with a headache and over the next 24 hours,  the patient noted generalization of the right-sided sensory changes to  include the right face, arm, and leg unassociated with visual field changed  or swallowing problems, but the patient did note some garbled speech.  The  patient was dropping things out of her right hand and had some nausea and  vomiting.  The patient reports daily episodes of chest pain dating back two  to three weeks.  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  The patient comes to  the emergency room for further evaluation.  The patient again denies any  double vision or loss of vision.  The patient denies any previous history of  similar events.  CT scan of the brain done through the emergency room shows  an old or subacute left thalamic lacunar infarct.  No other abnormalities  are seen.   PAST MEDICAL HISTORY:  1. History of new onset right hemisensory deficit consistent with     thalamocapsular infarct on the left.  2. Obesity.  3.  Hypertension that has been untreated.  4. Endometriosis.  5. Status post hysterectomy.   MEDICATIONS:  None.   ALLERGIES:  No known drug allergies.   SOCIAL HISTORY:  She smokes 3/4 pack of cigarettes a day. She drinks alcohol  on occasion.  Denies the use of illicit drugs.  This patient lives in the  [place] area, is unmarried, and has no children. 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   FAMILY HISTORY:  Mother is alive with a history of breast cancer.  Father  died with lung cancer.  The patient's two sisters, one with diabetes and one  with borderline diabetes.   REVIEW OF SYSTEMS:  Notable for no fevers or chills.  The patient denies  neck stiffness or neck pain. Does note some slight shortness of breath.  Does note chest  pain as above and has had some occasional nausea and  vomiting.  Does note some stress  incontinence of the bladder.  Denies  blackouts or seizures.  The patient denies any severe dizziness.   PHYSICAL EXAMINATION:  VITAL SIGNS:  Blood pressure 206/97, heart rate 75,  respiratory rate 20, temperature is afebrile.  GENERAL:  This patient is a markedly obese white female who is alert and  cooperative at the time of examination.  HEENT:  Atraumatic.  Eyes; pupils equal, round, and reactive to light.  Discs are flat bilaterally.  NECK:  Supple with no carotid bruits noted.  LUNGS:  Clear.  HEART:  Regular rate and rhythm with no obvious murmurs or rubs noted.  EXTREMITIES:  No significant edema, possibly trace at the ankles.  ABDOMEN:  Obese, positive bowel sounds with no organomegaly or tenderness  noted.  NEUROLOGY:  Cranial nerves as above. Facial symmetry is present. The patient  notes good symmetry to pinprick sensation bilaterally.  Has good strength of  facial muscles, muscle of head turning and shoulder shrug bilaterally.  The  patient is somewhat garbled dysarthric speech, no aphasia is seen.  Extraocular movements are full.  Visual fields  are full.  Motor testing  reveals good strength to direct testing in all four extremities.  The  patient, however, does have a right upper extremity drift.  The patient was  not ambulated.  The patient has fair finger-to-nose-to-finger and toe-to-  finger on the left side, has some ataxia with that on the right.  Deep  tendon reflexes were present, but symmetric.  Toes were neutral bilaterally.  Pinprick, soft touch, and vibratory sensation on the arms and legs were  normal.   LABORATORY DATA:  White count 9.2, hemoglobin 15.8, hematocrit 46.7, MCV  92.1, platelets 293, INR 0.8.  Sodium 137, potassium 3.7, chloride 103, CO2  27, glucose 103, BUN 8, creatinine 0.9, calcium 9.1, total protein 6.8,  albumin 3.9, AST of 18, ALT 15, alkaline phosphatase 76, total bilirubin  0.6, CK 129, MB fraction 4.4.  Troponin I of 0.03.  Urinalysis reveals  specific gravity of 1.022, pH 6.0, nitrite negative.  The patient has had an  EKG that shows normal sinus rhythm, ST and T was abnormalities, consider  lateral ischemia with prolonged QT interval, heart rate of 77.   Chest x-ray has been done with results pending.   CT of the head is as above.   IMPRESSION:  1. Right hemisensory deficit, likely thalamocapsular infarct.  2. Untreated hypertension.  3. Obesity.  4. Tobacco abuse.   This patient does not have a primary physician.  The patient comes in with  significant blood pressure problems and likely has small vessel ischemic  event.  CT scan of the head does show what appears to be a chronic or  possibly subacute left thalamic lacunar infarction.  The patient will be  brought in for further evaluation at this point.   PLAN:  1. Admission to [facility].  2. MRI of the brain.  3. MRI angiogram.  4. Two-dimensional echocardiogram.  5. Aspirin therapy.  6. Physical and occupational therapy. 7. The patient is quite claustrophobic and will give Ativan for the MRI     scan.   Follow  the patient's course while inhouse. The patient may need to be set up  with a primary care physician.  Will initiate hydrochlorothiazide, low dose  Altace for blood pressure now.  Jill Alexanders, M.D.    CKW/MEDQ  D:  04/01/2004  T:  04/01/2004  Job:  [id number]

## 2011-06-20 ENCOUNTER — Ambulatory Visit (INDEPENDENT_AMBULATORY_CARE_PROVIDER_SITE_OTHER): Payer: Self-pay | Admitting: Family Medicine

## 2011-06-20 ENCOUNTER — Encounter: Payer: Self-pay | Admitting: Family Medicine

## 2011-06-20 DIAGNOSIS — I10 Essential (primary) hypertension: Secondary | ICD-10-CM

## 2011-06-20 DIAGNOSIS — E669 Obesity, unspecified: Secondary | ICD-10-CM

## 2011-06-20 DIAGNOSIS — R0602 Shortness of breath: Secondary | ICD-10-CM | POA: Insufficient documentation

## 2011-06-20 LAB — COMPREHENSIVE METABOLIC PANEL
ALT: 16 U/L (ref 0–35)
AST: 22 U/L (ref 0–37)
Albumin: 4.2 g/dL (ref 3.5–5.2)
Alkaline Phosphatase: 83 U/L (ref 39–117)
BUN: 16 mg/dL (ref 6–23)
CO2: 27 mEq/L (ref 19–32)
Calcium: 9.6 mg/dL (ref 8.4–10.5)
Chloride: 100 mEq/L (ref 96–112)
Creat: 1.24 mg/dL — ABNORMAL HIGH (ref 0.50–1.10)
Glucose, Bld: 122 mg/dL — ABNORMAL HIGH (ref 70–99)
Potassium: 4.8 mEq/L (ref 3.5–5.3)
Sodium: 140 mEq/L (ref 135–145)
Total Bilirubin: 0.5 mg/dL (ref 0.3–1.2)
Total Protein: 7.2 g/dL (ref 6.0–8.3)

## 2011-06-20 LAB — CBC
HCT: 50.3 % — ABNORMAL HIGH (ref 36.0–46.0)
Hemoglobin: 16.2 g/dL — ABNORMAL HIGH (ref 12.0–15.0)
MCH: 30.9 pg (ref 26.0–34.0)
MCHC: 32.2 g/dL (ref 30.0–36.0)
MCV: 96 fL (ref 78.0–100.0)
Platelets: 249 10*3/uL (ref 150–400)
RBC: 5.24 MIL/uL — ABNORMAL HIGH (ref 3.87–5.11)
RDW: 15 % (ref 11.5–15.5)
WBC: 10.1 10*3/uL (ref 4.0–10.5)

## 2011-06-20 MED ORDER — HYDROCHLOROTHIAZIDE 25 MG PO TABS: 25.0000 mg | ORAL_TABLET | Freq: Every day | ORAL | Status: DC

## 2011-06-20 NOTE — Patient Instructions (Signed)
It was nice to meet you today.  I think it is really important that we get your blood pressure under better control.  I am going to send over a prescription for medication to help control your blood pressure called Hydrochlorothiazide.  It is a fluid pill to help with your blood pressure.  We may need to add a second medication depending on the response you have to the first one.  I would like to see you back in 1-2 weeks to see how you are doing with the new medication.  I would also like for you to take a baby aspirin 81mg  daily.  I will let you know the results of your labs when they return.  Remember if you develop chest pain or pressure, increased shortness of breath or weakness please go to the ED.

## 2011-06-21 LAB — BRAIN NATRIURETIC PEPTIDE: Brain Natriuretic Peptide: 147.6 pg/mL — ABNORMAL HIGH (ref 0.0–100.0)

## 2011-06-21 LAB — LDL CHOLESTEROL, DIRECT: Direct LDL: 203 mg/dL — ABNORMAL HIGH

## 2011-06-22 ENCOUNTER — Encounter: Payer: Self-pay | Admitting: Family Medicine

## 2011-06-22 NOTE — Assessment & Plan Note (Addendum)
BP elevated at todays visit, hx of uncontrolled HTN and CVA in the past.  Will get her back on medication to help lower her blood pressure.  Will start with HCTZ and consider addition of second agent if blood pressure remains uncontrolled.  Will get labs to look for signs of end organ damage.  Will have her return in 1-2 weeks to recheck blood pressure and adjust medications as needed.  Given red flags on when to go to ED.  Plans to meet with Clarice Pole to get enrolled with project access.

## 2011-06-22 NOTE — Assessment & Plan Note (Signed)
History of long uncontrolled HTN, shortness of breath at times and LE swelling concerning for possible CHF.  Will get BNP to see if she is in CHF or at least to establish a baseline for her.  If abnormal will consider ECHO to evaluate heart function.

## 2011-06-22 NOTE — Assessment & Plan Note (Signed)
Given history of CVA and elevated blood pressure.  Concern for other metabolic abnormalities related to obesity.  Will get D-LDL to look for elevated cholesterol.

## 2011-06-22 NOTE — Progress Notes (Signed)
  Subjective:    Patient ID: [name], female    DOB: 1956-08-26, 55 y.o.   MRN: [id number]  HPI Patient is here as a new patient to our practice.  Here to be followed for her HTN.  Has not been on blood pressure medicine x5 years 2/2 to being unable to afford medication.  She does have a history of CVA 2/2 to her uncontrolled HTN.  At time of her CVA she was told to take daily ASA but she has not been doing this.  Her last BP medicine she was on was Atenolol.  Finally decided to come in since she was starting to have headaches.   She denies any weakness but does get occasional tingling into her L hand.  She did have one episode of sharp chest pain that lasted a few minutes last week and she still feels sore in her ribs.  Pain was not associated with activity or made better with rest.  Pain did not radiate.  She has also had increased shortness of breath, she does have O2 at home, unclear why.  Last week she had it at 1.5 L/min because it was so hot and humid.  She has had some swelling in her legs as well but no increase recently.  She is a smoker but states she has cut back from 2 ppd to 4 cigs per day.    Review of Systems She does endorse urinary frequency She denies nausea, vomiting, weakness, dysuria, polydipsia, abdominal pain, fever/chills    Objective:   Physical Exam  Constitutional: She is oriented to person, place, and time.       Obese, NAD   HENT:  Head: Normocephalic and atraumatic.  Eyes: Pupils are equal, round, and reactive to light.       Unable to visualize fundi  Neck: Normal range of motion. Neck supple. No thyromegaly present.  Cardiovascular: Normal rate, regular rhythm and normal heart sounds.   Pulmonary/Chest: Effort normal and breath sounds normal. No respiratory distress.  Abdominal: Soft. Bowel sounds are normal.  Musculoskeletal: She exhibits edema.       1+   Neurological: She is alert and oriented to person, place, and time.  Skin: Skin is warm  and dry.          Assessment & Plan:

## 2011-06-25 ENCOUNTER — Telehealth: Payer: Self-pay | Admitting: Family Medicine

## 2011-06-26 ENCOUNTER — Telehealth: Payer: Self-pay | Admitting: Family Medicine

## 2011-06-26 MED ORDER — SIMVASTATIN 40 MG PO TABS: 40.0000 mg | ORAL_TABLET | Freq: Every evening | ORAL | Status: DC

## 2011-06-26 NOTE — Telephone Encounter (Signed)
Returning MD's call.

## 2011-06-26 NOTE — Telephone Encounter (Signed)
Called patient to let her know the results of her labs.  Explained to her that her cholesterol is quite high and that I would be starting her on a medication to help control this.  Will rx simvastatin for now, recheck lipids in 3 months.

## 2011-07-01 ENCOUNTER — Encounter: Payer: Self-pay | Admitting: Family Medicine

## 2011-07-01 ENCOUNTER — Ambulatory Visit (INDEPENDENT_AMBULATORY_CARE_PROVIDER_SITE_OTHER): Payer: Self-pay | Admitting: Family Medicine

## 2011-07-01 VITALS — BP 180/91 | HR 101 | Temp 98.1°F | Ht 63.5 in | Wt 269.6 lb

## 2011-07-01 DIAGNOSIS — I10 Essential (primary) hypertension: Secondary | ICD-10-CM

## 2011-07-01 DIAGNOSIS — E785 Hyperlipidemia, unspecified: Secondary | ICD-10-CM

## 2011-07-01 DIAGNOSIS — R200 Anesthesia of skin: Secondary | ICD-10-CM

## 2011-07-01 DIAGNOSIS — R209 Unspecified disturbances of skin sensation: Secondary | ICD-10-CM

## 2011-07-01 DIAGNOSIS — E669 Obesity, unspecified: Secondary | ICD-10-CM

## 2011-07-01 DIAGNOSIS — R0602 Shortness of breath: Secondary | ICD-10-CM

## 2011-07-01 LAB — TSH: TSH: 6.613 u[IU]/mL — ABNORMAL HIGH (ref 0.350–4.500)

## 2011-07-01 MED ORDER — LISINOPRIL 10 MG PO TABS: 10.0000 mg | ORAL_TABLET | Freq: Every day | ORAL | Status: DC

## 2011-07-01 MED ORDER — PRAVASTATIN SODIUM 40 MG PO TABS: 40.0000 mg | ORAL_TABLET | Freq: Every evening | ORAL | Status: DC

## 2011-07-01 NOTE — Patient Instructions (Signed)
I would like to see you back in two weeks to recheck your kidney function and to see how your blood pressure is doing.  I will call to let you know the results of your thyroid tests when they return.  If you have weakness on one side or symptoms increase or stay, it may be a good idea to go to the ED to get evaluated.   Please call with any questions.

## 2011-07-02 DIAGNOSIS — R202 Paresthesia of skin: Secondary | ICD-10-CM | POA: Insufficient documentation

## 2011-07-02 DIAGNOSIS — R200 Anesthesia of skin: Secondary | ICD-10-CM | POA: Insufficient documentation

## 2011-07-02 DIAGNOSIS — E782 Mixed hyperlipidemia: Secondary | ICD-10-CM | POA: Insufficient documentation

## 2011-07-02 DIAGNOSIS — E785 Hyperlipidemia, unspecified: Secondary | ICD-10-CM | POA: Insufficient documentation

## 2011-07-02 NOTE — Assessment & Plan Note (Addendum)
Numbness and tingling likely related to neuropathy or carpal tunnel.  Do not think this is a stroke given bilateral distribution and intermittent nature of symptoms.  Neuro exam completely normal today.  Given puffiness will get TSH to see if this may contributing to puffiness.

## 2011-07-02 NOTE — Assessment & Plan Note (Signed)
Likely with underlying sleep apnea.  Still smoking despite shortness of breath at times. Will try to get referral for sleep study once she has orange card.

## 2011-07-02 NOTE — Assessment & Plan Note (Signed)
BP still elevated, red flags reviewed.  Will add on lisinopril for additional control.  RTC in two weeks for recheck

## 2011-07-02 NOTE — Assessment & Plan Note (Signed)
WIll change Rx to pravastatin, recheck lipids in 3 mos

## 2011-07-02 NOTE — Progress Notes (Signed)
  Subjective:    Patient ID: [name], female    DOB: 1956/07/06, 55 y.o.   MRN: [id number]  HPI 1. Neuro:  Patient comes in today stating "I feel like I might be having another stroke"  Reports that she feels like she has had intermittent slurred speech and drooling over the past couple of weeks.  Also having numbness and weakness in hands and arms bilaterally.  Also feels unstable on her feet some days.  Feels like she has to squeeze her hands to get feeling back in them.  Patient states she currently has slurred speech and numbness/tingling in hands 2.HTN:  Has started on HCTZ, tolerating well. BP still quite elevated today. Understands will likely need additional medication.  Feels like she is holding onto fluid.  She is still smoking, understands she needs to quit, currently cut down to 4 cigs per day.   Does get SOB at times, has home O2 at home that she is supposed to use at night, but sometimes using during the day when it is hot.  3.HLD: Cholesterol elevated, simvastatin prescribed initially but unable to afford.     Review of Systems Does have epigastric pain, but denies chest pain or pressure, palpitations, headache, nausea, vomiting, diarrhea, constipation, blood or dark stools.    Objective:   Physical Exam  Constitutional: She is oriented to person, place, and time.       Morbidly obese, nad  Cardiovascular: Normal rate, regular rhythm and normal heart sounds.   Pulmonary/Chest: Effort normal and breath sounds normal.  Abdominal: Soft. Bowel sounds are normal. There is no tenderness.  Musculoskeletal:       No pitting edema but increased puffiness in upper extremities and face.  Neurological: She is alert and oriented to person, place, and time. She has normal strength. She is not disoriented. No cranial nerve deficit or sensory deficit. She displays a negative Romberg sign. Gait normal.       All neuro testing without focal deficits.  Cranial nerves tested and intact.   Sensory tested and intact.  Strength 5/5 throughout. No dysarthria or slurred speech, although patient reports she thinks her speech is slurred.          Assessment & Plan:

## 2011-07-18 ENCOUNTER — Ambulatory Visit: Payer: Self-pay | Admitting: Family Medicine

## 2011-08-07 ENCOUNTER — Other Ambulatory Visit: Payer: Self-pay

## 2011-08-07 ENCOUNTER — Ambulatory Visit (INDEPENDENT_AMBULATORY_CARE_PROVIDER_SITE_OTHER): Payer: Self-pay | Admitting: Family Medicine

## 2011-08-07 ENCOUNTER — Ambulatory Visit (HOSPITAL_COMMUNITY)
Admission: AD | Admit: 2011-08-07 | Discharge: 2011-08-07 | Disposition: A | Discharge status: Home / Self Care | Payer: Self-pay | Source: Ambulatory Visit | Attending: Family Medicine | Admitting: Family Medicine

## 2011-08-07 ENCOUNTER — Inpatient Hospital Stay (HOSPITAL_COMMUNITY): Payer: Self-pay

## 2011-08-07 ENCOUNTER — Inpatient Hospital Stay (HOSPITAL_COMMUNITY)
Admission: AD | Admit: 2011-08-07 | Discharge: 2011-08-10 | DRG: 313 | Disposition: A | Discharge status: Home / Self Care | Payer: Self-pay | Source: Ambulatory Visit | Attending: Family Medicine | Admitting: Family Medicine

## 2011-08-07 ENCOUNTER — Encounter: Payer: Self-pay | Admitting: Family Medicine

## 2011-08-07 DIAGNOSIS — E119 Type 2 diabetes mellitus without complications: Secondary | ICD-10-CM | POA: Diagnosis present

## 2011-08-07 DIAGNOSIS — K5730 Diverticulosis of large intestine without perforation or abscess without bleeding: Secondary | ICD-10-CM | POA: Diagnosis present

## 2011-08-07 DIAGNOSIS — R079 Chest pain, unspecified: Secondary | ICD-10-CM

## 2011-08-07 DIAGNOSIS — E785 Hyperlipidemia, unspecified: Secondary | ICD-10-CM

## 2011-08-07 DIAGNOSIS — Z8673 Personal history of transient ischemic attack (TIA), and cerebral infarction without residual deficits: Secondary | ICD-10-CM

## 2011-08-07 DIAGNOSIS — F411 Generalized anxiety disorder: Secondary | ICD-10-CM

## 2011-08-07 DIAGNOSIS — I10 Essential (primary) hypertension: Secondary | ICD-10-CM

## 2011-08-07 DIAGNOSIS — I5030 Unspecified diastolic (congestive) heart failure: Secondary | ICD-10-CM | POA: Diagnosis present

## 2011-08-07 DIAGNOSIS — Z7982 Long term (current) use of aspirin: Secondary | ICD-10-CM

## 2011-08-07 DIAGNOSIS — R0602 Shortness of breath: Secondary | ICD-10-CM | POA: Diagnosis present

## 2011-08-07 DIAGNOSIS — F17200 Nicotine dependence, unspecified, uncomplicated: Secondary | ICD-10-CM | POA: Diagnosis present

## 2011-08-07 LAB — COMPREHENSIVE METABOLIC PANEL
ALT: 28 U/L (ref 0–35)
AST: 32 U/L (ref 0–37)
Albumin: 4 g/dL (ref 3.5–5.2)
Alkaline Phosphatase: 90 U/L (ref 39–117)
BUN: 18 mg/dL (ref 6–23)
CO2: 31 mEq/L (ref 19–32)
Calcium: 9.4 mg/dL (ref 8.4–10.5)
Chloride: 100 mEq/L (ref 96–112)
Creatinine, Ser: 1.25 mg/dL — ABNORMAL HIGH (ref 0.50–1.10)
GFR calc Af Amer: 54 mL/min — ABNORMAL LOW (ref >60)
GFR calc non Af Amer: 45 mL/min — ABNORMAL LOW (ref >60)
Glucose, Bld: 133 mg/dL — ABNORMAL HIGH (ref 70–99)
Potassium: 4.3 mEq/L (ref 3.5–5.1)
Sodium: 140 mEq/L (ref 135–145)
Total Bilirubin: 0.4 mg/dL (ref 0.3–1.2)
Total Protein: 7.3 g/dL (ref 6.0–8.3)

## 2011-08-07 LAB — CARDIAC PANEL(CRET KIN+CKTOT+MB+TROPI)
CK, MB: 3.5 ng/mL (ref 0.3–4.0)
Relative Index: 2.5 (ref 0.0–2.5)
Total CK: 142 U/L (ref 7–177)
Troponin I: <0.3 ng/mL (ref <0.30)

## 2011-08-07 LAB — CBC
HCT: 45.9 % (ref 36.0–46.0)
Hemoglobin: 15.4 g/dL — ABNORMAL HIGH (ref 12.0–15.0)
MCH: 31.8 pg (ref 26.0–34.0)
MCHC: 33.6 g/dL (ref 30.0–36.0)
MCV: 94.6 fL (ref 78.0–100.0)
Platelets: 208 10*3/uL (ref 150–400)
RBC: 4.85 MIL/uL (ref 3.87–5.11)
RDW: 14.4 % (ref 11.5–15.5)
WBC: 11 10*3/uL — ABNORMAL HIGH (ref 4.0–10.5)

## 2011-08-07 LAB — PRO B NATRIURETIC PEPTIDE: Pro B Natriuretic peptide (BNP): 1028 pg/mL — ABNORMAL HIGH (ref 0–125)

## 2011-08-07 MED ORDER — NITROGLYCERIN 0.3 MG SL SUBL: 0.4000 mg | SUBLINGUAL_TABLET | Freq: Once | SUBLINGUAL | Status: DC

## 2011-08-07 MED ORDER — ASPIRIN 325 MG PO TABS: 325.0000 mg | ORAL_TABLET | Freq: Once | ORAL | Status: DC

## 2011-08-07 MED ORDER — NITROGLYCERIN 0.4 MG SL SUBL: 0.4000 mg | SUBLINGUAL_TABLET | Freq: Once | SUBLINGUAL | Status: DC

## 2011-08-07 NOTE — Progress Notes (Signed)
[facility] Admission History and Physical  Patient name: [name] Medical record number: [id number] Date of birth: [birth year] Age: 55 y.o. Gender: female  Primary Care Provider: Mabeline Varas, DO  Chief Complaint: Chest Pain History of Present Illness: [name] is a 55 y.o. year old female with history of CVA, HTN and hyperlipidemia who presented to clinic today for follow up of blood pressure.  Upon arrival to clinic patients blood pressure was found to be 200/100 and she was endorsing chest pain.  Patient states chest pain has been somewhat constant for the past week and points to an area under her left ribs and sternum as the source of her pain.  Chest pain has worsened today and she describes the pain as feeling "like someone is sitting on my chest."  She denies radiation of the pain but she has been feeling nauseous this morning.  She does have increased shortness of breath recently and has trouble doing activities she could normally do such as helping her mother mow the grass, because she gets chest pain and shortness of breath with this.  She has had increased swelling all over for the past few days,  more frequent headaches, and cough.  She denies abdominal pain, vomiting, diarrhea, fever, chills, dizziness, palpitation, weakness.   At clinic received full dose ASA and nitroglycerin.  EKG obtained which showed NSR with no ST-T wave changes.   Patient Active Problem List  Diagnoses  . Hypertension  . Obesity  . Shortness of breath  . HLD (hyperlipidemia)  . Numbness and tingling in hands   Past Medical History: Past Medical History  Diagnosis Date  . CVA (cerebral infarction) 03/29/2004  . HTN (hypertension)     Past Surgical History: Past Surgical History  Procedure Date  . Total abdominal hysterectomy 1994    Social History: History   Social History  . Marital Status: Single    Spouse Name: N/A    Number of Children:  N/A  . Years of Education: N/A   Social History Main Topics  . Smoking status: Current Everyday Smoker -- 0.3 packs/day for 30 years  . Smokeless tobacco: Not on file  . Alcohol Use: Yes     Occasional beer  . Drug Use: No  . Sexually Active: Not on file   Other Topics Concern  . Not on file   Social History Narrative   Currently unemployed.  Really wants to find a job, but has been difficult for her.  No children, not married    Family History: Family History  Problem Relation Age of Onset  . Colon cancer Sister 42  . Breast cancer Mother 52  . Diabetes Sister 55  . Diabetes Sister 5  . Lung cancer Father     Allergies: Allergies  Allergen Reactions  . Vicodin (Hydrocodone-Acetaminophen) Nausea And Vomiting    Current Outpatient Prescriptions  Medication Sig Dispense Refill  . aspirin (BAYER ASPIRIN) 325 MG tablet Take 1 tablet (325 mg total) by mouth once.  100 tablet  3  . hydrochlorothiazide 25 MG tablet Take 1 tablet (25 mg total) by mouth daily.  90 tablet  6  . lisinopril (PRINIVIL,ZESTRIL) 10 MG tablet Take 1 tablet (10 mg total) by mouth daily.  30 tablet  3  . pravastatin (PRAVACHOL) 40 MG tablet Take 1 tablet (40 mg total) by mouth every evening.  30 tablet  11   Current Facility-Administered Medications  Medication Dose Route Frequency Provider Last Rate  Last Dose  . nitroGLYCERIN (NITROSTAT) SL tablet 0.4 mg  0.4 mg Sublingual Once Luetta Nutting      . DISCONTD: nitroGLYCERIN (NITROSTAT) SL tablet 0.3 mg  0.3 mg Sublingual Once Marion: Per HPI  Physical Exam: BP 200/100  Pulse 78  Temp(Src) 98 F (36.7 C) (Oral)  Ht 5' 3.5" (1.613 m)  Wt 272 lb (123.378 kg)  BMI 47.43 kg/m2            General: alert, cooperative, no distress and morbidly obese HEENT: PERRLA, extra ocular movement intact, sclera clear, anicteric and neck supple with midline trachea Heart: S1, S2 normal, no murmur, rub or gallop, regular rate and  rhythm Lungs: clear to auscultation, no wheezes or rales and unlabored breathing Abdomen: abdomen is soft without significant tenderness, masses, organomegaly or guarding Extremities: extremities normal, atraumatic, no cyanosis or edema Skin:no rashes Neurology: normal without focal findings, mental status, speech normal, alert and oriented x3, sensation grossly normal and gait and station normal Musculoskeletal:  Tenderness over anterior left ribs to palpation, no click or crepitus.  Labs and Imaging: EKG:  NSR, no ST-T wave changes.   Assessment and Plan: [name] is a 55 y.o. year old female with uncontrolled HTN, HLD, and hx of CVA who presents with chest pain 1. Chest Pain:  Patient with symptoms that sound like angina given her increased dyspnea and chest pain with activity.  Patient is high risk given her history of smoking, uncontrolled hypertension and hyperlipidemia.  Will admit to telemetry floor for observation and cycle cardiac enzymes. Differential includes rib fracture, costochondritis, GERD and pneumonia.  Rib fracture unlikely given lack of trauma and pneumonia unlikely given no fever but will get chest X-ray for further evaluation.  Will risk stratify with complete fasting lipid panel and HgbA1C.  Had TSH recently that was mildly elevated at 6.613.  Recheck EKG in the morning.  Morphine and nitroglycerin for pain control.  Continue ASA.   2.   HTN:  BP very elevated today in office.  Supposed to be on HCTZ and Lisinopril, however never picked up lisinopril after last appointment.  Will place her on these with hydralazine PRN for systolic greater than 99991111. 3. HLD:  Currently on pravastatin.  Last D-LDL was 203.  Will get complete fasting lipid panel today.  Would likely benefit from a higher potency statin but has been unable to afford.  She has recently gotten [employer] and would likely be eligible for MAP program.  4. Tobacco abuse:  Will get smoking cessation consult  while hospitalized.  5. FEN/GI:  SLIV, Low sodium heart healthy 6. Prophylaxis: Heparin 5000 units SQ TID 7. Disposition: Pending further work up

## 2011-08-08 DIAGNOSIS — I517 Cardiomegaly: Secondary | ICD-10-CM

## 2011-08-08 DIAGNOSIS — R079 Chest pain, unspecified: Secondary | ICD-10-CM

## 2011-08-08 LAB — HEMOGLOBIN A1C
Hgb A1c MFr Bld: 7 % — ABNORMAL HIGH (ref <5.7)
Mean Plasma Glucose: 154 mg/dL — ABNORMAL HIGH (ref <117)

## 2011-08-08 LAB — CBC
HCT: 45.8 % (ref 36.0–46.0)
Hemoglobin: 15 g/dL (ref 12.0–15.0)
MCH: 31.1 pg (ref 26.0–34.0)
MCHC: 32.8 g/dL (ref 30.0–36.0)
MCV: 95 fL (ref 78.0–100.0)
Platelets: 201 10*3/uL (ref 150–400)
RBC: 4.82 MIL/uL (ref 3.87–5.11)
RDW: 14.6 % (ref 11.5–15.5)
WBC: 9.3 10*3/uL (ref 4.0–10.5)

## 2011-08-08 LAB — COMPREHENSIVE METABOLIC PANEL
ALT: 22 U/L (ref 0–35)
AST: 25 U/L (ref 0–37)
Albumin: 3.7 g/dL (ref 3.5–5.2)
Alkaline Phosphatase: 89 U/L (ref 39–117)
BUN: 20 mg/dL (ref 6–23)
CO2: 30 mEq/L (ref 19–32)
Calcium: 8.9 mg/dL (ref 8.4–10.5)
Chloride: 99 mEq/L (ref 96–112)
Creatinine, Ser: 1.27 mg/dL — ABNORMAL HIGH (ref 0.50–1.10)
GFR calc Af Amer: 53 mL/min — ABNORMAL LOW (ref >60)
GFR calc non Af Amer: 44 mL/min — ABNORMAL LOW (ref >60)
Glucose, Bld: 175 mg/dL — ABNORMAL HIGH (ref 70–99)
Potassium: 3.8 mEq/L (ref 3.5–5.1)
Sodium: 139 mEq/L (ref 135–145)
Total Bilirubin: 0.4 mg/dL (ref 0.3–1.2)
Total Protein: 6.9 g/dL (ref 6.0–8.3)

## 2011-08-08 LAB — LIPID PANEL
Cholesterol: 256 mg/dL — ABNORMAL HIGH (ref 0–200)
HDL: 34 mg/dL — ABNORMAL LOW (ref >39)
LDL Cholesterol: 152 mg/dL — ABNORMAL HIGH (ref 0–99)
Total CHOL/HDL Ratio: 7.5 RATIO
Triglycerides: 350 mg/dL — ABNORMAL HIGH (ref <150)
VLDL: 70 mg/dL — ABNORMAL HIGH (ref 0–40)

## 2011-08-08 LAB — CARDIAC PANEL(CRET KIN+CKTOT+MB+TROPI)
CK, MB: 3.4 ng/mL (ref 0.3–4.0)
CK, MB: 3.9 ng/mL (ref 0.3–4.0)
Relative Index: 2.4 (ref 0.0–2.5)
Relative Index: 3 — ABNORMAL HIGH (ref 0.0–2.5)
Total CK: 140 U/L (ref 7–177)
Total CK: 132 U/L (ref 7–177)
Troponin I: <0.3 ng/mL (ref <0.30)
Troponin I: <0.3 ng/mL (ref <0.30)

## 2011-08-08 NOTE — Progress Notes (Signed)
  Subjective:    Patient ID: [name], female    DOB: 08-18-1956, 55 y.o.   MRN: [id number]  HPI    Review of Systems     Objective:   Physical Exam  Chest: Patient very tender over left chest wall over distal anterior axillary line.      Assessment & Plan:   I saw patient with Dr Zigmund Daniel.  I agree with his findings and plans as documented in his note of admission. Elonzo Sopp D

## 2011-08-09 LAB — BASIC METABOLIC PANEL
BUN: 24 mg/dL — ABNORMAL HIGH (ref 6–23)
CO2: 33 mEq/L — ABNORMAL HIGH (ref 19–32)
Calcium: 9.1 mg/dL (ref 8.4–10.5)
Chloride: 98 mEq/L (ref 96–112)
Creatinine, Ser: 1.3 mg/dL — ABNORMAL HIGH (ref 0.50–1.10)
GFR calc Af Amer: 52 mL/min — ABNORMAL LOW (ref >60)
GFR calc non Af Amer: 43 mL/min — ABNORMAL LOW (ref >60)
Glucose, Bld: 162 mg/dL — ABNORMAL HIGH (ref 70–99)
Potassium: 4.2 mEq/L (ref 3.5–5.1)
Sodium: 140 mEq/L (ref 135–145)

## 2011-08-09 LAB — HEMOGLOBIN A1C
Hgb A1c MFr Bld: 6.9 % — ABNORMAL HIGH (ref <5.7)
Mean Plasma Glucose: 151 mg/dL — ABNORMAL HIGH (ref <117)

## 2011-08-09 LAB — GLUCOSE, CAPILLARY: Glucose-Capillary: 236 mg/dL — ABNORMAL HIGH (ref 70–99)

## 2011-08-09 LAB — PRO B NATRIURETIC PEPTIDE: Pro B Natriuretic peptide (BNP): 437.8 pg/mL — ABNORMAL HIGH (ref 0–125)

## 2011-08-09 LAB — D-DIMER, QUANTITATIVE (NOT AT ARMC): D-Dimer, Quant: 0.52 ug/mL-FEU — ABNORMAL HIGH (ref 0.00–0.48)

## 2011-08-10 ENCOUNTER — Inpatient Hospital Stay (HOSPITAL_COMMUNITY): Payer: Self-pay

## 2011-08-10 LAB — GLUCOSE, CAPILLARY
Glucose-Capillary: 196 mg/dL — ABNORMAL HIGH (ref 70–99)
Glucose-Capillary: 178 mg/dL — ABNORMAL HIGH (ref 70–99)

## 2011-08-10 MED ORDER — XENON XE 133 GAS
10.0000 | GAS_FOR_INHALATION | Freq: Once | RESPIRATORY_TRACT | Status: AC | PRN
  Administered 2011-08-10: 10 via RESPIRATORY_TRACT

## 2011-08-10 MED ORDER — TECHNETIUM TO 99M ALBUMIN AGGREGATED
6.0000 | Freq: Once | INTRAVENOUS | Status: AC | PRN
  Administered 2011-08-10: 6 via INTRAVENOUS

## 2011-08-18 NOTE — Consult Note (Signed)
NAME:  [name], [name]            ACCOUNT NO.:  [id number]  MEDICAL RECORD NO.:  [id number]  LOCATION:                                 FACILITY:  PHYSICIAN:  Fay Records, MD, [facility]     DATE OF BIRTH:  DATE OF CONSULTATION:  08/08/2011 DATE OF DISCHARGE:                                CONSULTATION   IDENTIFICATION:  The patient is a 55 year old who we were asked to see regarding chest pain.  HISTORY OF PRESENT ILLNESS:  The patient has no known history of coronary artery disease.  She is followed by Dr. Zigmund Daniel in clinic. She was seen yesterday for blood pressure evaluation. Blood pressure was noted to be elevated 200/100.  She also gave a history of chest pain.  On talking to the patient, her symptoms of heaviness began about a month ago, again a heavy pressure sensation.  It would come and go about 3-4 times per day, occurred with and without activity associated with shortness of breath, with activities of pressure would get worse.  Over the past week, her symptoms have been constant.  No relief, not pleuritic.  Now, she says the pressure is about a 5/10.  Notes some PND, increased edema for the past several days.  ALLERGIES:  VICODIN leading to nausea and vomiting.  MEDICATIONS AT HOME: 1. Hydrochlorothiazide 25 mg daily. 2. Aspirin 81 mg daily. 3. Lisinopril 10 daily. 4. Pravachol 40 daily.  Here in the hospital: 5. Heparin subcu 5000 units q. 8 hours. 6. Aspirin 325. 7. Zocor 20. 8. Lisinopril 20. 9. Hydrochlorothiazide 25. 10.Hydralazine p.r.n. IV.  PAST MEDICAL HISTORY: 1. Hypertension times years. 2. Hypothyroidism. 3. History of a CVA in 2005. 4. History of question positive troponin. 5. History of cocaine use remote. 6. History of renal failure. 7. History of diverticulitis. 8. History of noncompliance. 9. History of dyslipidemia.  LDL was noted to be 203 at one point. 10.The patient had been prescribed home O2.  No records for this.  PAST SURGICAL  HISTORY:  TAH.  SOCIAL HISTORY:  The patient is unemployed.  She is down to a third of pack per day of smoking from 2 packs per day.  Drinks rarely.  Remote cocaine 2007-2008.  FAMILY HISTORY:  Mother had breast cancer.  Father had lung cancer.  One sister with colon cancer, 2 sisters with diabetes.  REVIEW OF SYSTEMS:  No fevers, chills.  Positive increased headache, shortness of breath, dyspnea on exertion, PND and edema as noted.  GU: Negative.  GI:  Negative.  ENDOCRINE:  As noted above.  Otherwise all systems reviewed.  PHYSICAL EXAMINATION:  GENERAL:  The patient is an obese 55 year old in no acute distress, she has chest pressure of 5/10. VITAL SIGNS:  Blood pressure 159-217 over 80-110, pulse is 68 and regular, temperature is 97, respiratory rate is 20.  Telemetry sinus rhythm. HEENT:  Normocephalic, atraumatic.  EOMI.  PERRL. NECK:  JVP is normal.  No bruits.  No thyromegaly. LUNGS:  Clear to auscultation.  No rales or wheezes. CARDIAC:  Regular rate and rhythm.  S1-S2.  No S3, S4 or murmurs. ABDOMEN:  Distended, mild diffuse tenderness.  No hepatomegaly.  No palpable  masses. EXTREMITIES:  Trace edema bilaterally, 2+ pulses. MUSCULOSKELETAL:  Moving all extremities.  No deformity. NEURO:  Alert and oriented x3.  Cranial nerves II-XII grossly intact.  IMAGING:  Chest x-ray shows cardiomegaly with pulmonary venous hypertension, left rib films without acute fracture.  Twelve-lead EKG shows sinus rhythm 67 beats per minute, T-wave inversion V5, V6, I and L.  LABORATORY DATA:  Significant for hemoglobin of 15, WBC 9.3, platelets of 201,000, BUN and creatinine of 20 and 1.3, potassium of 3.8.  BNP of 1028.  CK-MB negative x3.  Troponin less than 0.3 x3.  Total cholesterol of 256, LDL of 152, HDL 34, triglycerides of 350.  IMPRESSION:  The patient is a 55 year old who was admitted with chest pain and hypertension.  Chest pain is really a pressure and constant for 2 weeks  began about a month ago.  Activity does make it worse plus associated with shortness of breath.  On exam, blood pressure is increased.  She is an obese woman.  EKG shows T-wave inversion laterally.  Labs significant for negative cardiac markers x3.  BNP is mildly increased.  Chest x-ray venous congestion.  Overall, I am not convinced that the chest pressure she is experiencing is due to ischemia from major blood vessel disease, question secondary to increased filling pressures.  RECOMMENDATIONS: 1. Recommend echo to evaluate LV systolic and diastolic function as     well as estimated PA pressures.  I would     diurese with Lasix x1.  Follow output. 2. Hypertension will need to be treated more aggressively. 3. Dyslipidemia.  Agree with statin.     Fay Records, MD, [facility]     PVR/MEDQ  D:  08/08/2011  T:  08/08/2011  Job:  [id number]  Electronically Signed by Dorris Carnes MD [facility] on 08/18/2011 06:09:43 AM

## 2011-08-21 ENCOUNTER — Telehealth: Payer: Self-pay | Admitting: *Deleted

## 2011-08-21 ENCOUNTER — Telehealth (HOSPITAL_COMMUNITY): Payer: Self-pay

## 2011-08-21 NOTE — Telephone Encounter (Signed)
Irven Baltimore, RN at [facility]. Re: pos scan for PE, it was performed on 08-09-11. Nurse reports, that she read in d/c summary, that Dr.Matthews would re-admit pt to hospital, if scan is positive. Paged Dr.Matthews to address. Will call nurse back. [name], [name]

## 2011-08-21 NOTE — Telephone Encounter (Signed)
V/Q scan with only intermediate probability.   At time of discharge she lacked symptoms of PE and given low specificity of V/Q scan,  I do not think she needs to be re-admitted and treated for PE at this time.  She was given red flags to look for at home that would suggest a PE.  She has follow up with me on 9/21.

## 2011-08-21 NOTE — Telephone Encounter (Signed)
Spoke with Mauricia Area at [facility] regarding a VQ scan that was performed  08/09/11 on [facility]. In the discharge summary the scan report wasn't completed at time of discharge with a comment that if the scan was positive the patient would be re-admitted by Dr. Luetta Nutting. Since, the patient lacked symptoms for PE and low specificity of VQ scan at discharge, Dr. Zigmund Daniel doesn't think the patient needs to be readmitted at this time and it is okay for the patient to have the [place] on 08/25/11 per Dr.Cody Matthews/ per [name]. Thang Flett,RN.

## 2011-08-21 NOTE — Telephone Encounter (Signed)
Per Dr.Matthews: pt can keep appt for nuclear stress test on Monday. Called Patsy and informed. [name], [name]

## 2011-08-21 NOTE — Telephone Encounter (Signed)
Called P[facility] with info.

## 2011-08-25 ENCOUNTER — Ambulatory Visit (HOSPITAL_COMMUNITY): Payer: Self-pay | Attending: Family Medicine | Admitting: Radiology

## 2011-08-25 DIAGNOSIS — R0602 Shortness of breath: Secondary | ICD-10-CM

## 2011-08-25 DIAGNOSIS — R0609 Other forms of dyspnea: Secondary | ICD-10-CM

## 2011-08-25 DIAGNOSIS — E119 Type 2 diabetes mellitus without complications: Secondary | ICD-10-CM

## 2011-08-25 DIAGNOSIS — R0989 Other specified symptoms and signs involving the circulatory and respiratory systems: Secondary | ICD-10-CM

## 2011-08-25 DIAGNOSIS — R079 Chest pain, unspecified: Secondary | ICD-10-CM | POA: Insufficient documentation

## 2011-08-25 HISTORY — PX: NM MYOVIEW LTD: HXRAD82

## 2011-08-25 MED ORDER — TECHNETIUM TC 99M TETROFOSMIN IV KIT
33.0000 | PACK | Freq: Once | INTRAVENOUS | Status: AC | PRN
  Administered 2011-08-25: 33 via INTRAVENOUS

## 2011-08-25 NOTE — Progress Notes (Signed)
[address] [place] [address] [phone]  Cardiology Nuclear Med Study  [name] is a 55 y.o. female [id number] 04/08/1956   Nuclear Med Background Indication for Stress Test:  Evaluation for Ischemia and 08/10/11 [facility] with CP and negative enzymes History:  No previous documented CAD and 08/08/11 Echo:EF=50-55%, moderate LVH, history of CHF Cardiac Risk Factors:'05 CVA, Hypertension, Lipids, New Dx. NIDDM and Smoker  Symptoms:  Chest Pain/Pressure with and without exercise  (last episode of chest discomfort is at present/"chronic CP"), Dizziness, DOE, Fatigue with Exertion, Light-Headedness, Nausea, Near Syncope, Rapid HR and Vomiting   Nuclear Pre-Procedure Caffeine/Decaff Intake:  None NPO After: 9:00pm   Lungs:  Clear IV 0.9% NS with Angio Cath:  22g  IV Site: R Wrist  IV Started by:  Irven Baltimore, RN  Chest Size (in):  44 Cup Size: B  Height: 5\' 3"  (1.6 m)  Weight:  273 lb (123.832 kg)  BMI:  Body mass index is 48.36 kg/(m^2). Tech Comments:IV 22G (R) hand by Hermine Messick, RN on 08/25/11.  We attempted Ms. Proffit's stress test on the treadmill today and she was unable to keep up, she got very sob, and could only make it 1:36. She had [employer] this am so she couldn't be switched to [employer]. She is coming in tomorrow on 08/26/11 for a Lexiscan stress myoview.    Nuclear Med Study 1 or 2 day study: 2 day  Stress Test Type:  Carlton Adam  Reading MD: Mertie Moores, MD  Order Authorizing Provider:  Dorris Carnes, MD  Resting Radionuclide: Technetium 8m Tetrofosmin  Resting Radionuclide Dose: 33 mCi   Stress Radionuclide:  Technetium 56m Tetrofosmin  Stress Radionuclide Dose: 33 mCi           Stress Protocol Rest HR: 75 Stress HR: 84  Rest BP: 155/83 Stress BP: 203/88  Exercise Time (min): n/a METS: n/a   Predicted Max HR: 166 bpm % Max HR: 50.6 bpm Rate Pressure Product: 17052   Dose of Adenosine  (mg):  n/a Dose of Lexiscan: 0.4 mg  Dose of Atropine (mg): n/a Dose of Dobutamine: n/a mcg/kg/min (at max HR)  Stress Test Technologist: Irven Baltimore, RN  Nuclear Technologist:  Charlton Amor, CNMT     Rest Procedure:  Myocardial perfusion imaging was performed at rest 45 minutes following the intravenous administration of Technetium 57m Tetrofosmin.  Rest ECG: Nonspecific T-wave changes.  Stress Procedure:  The patient received IV Lexiscan 0.4 mg over 15-seconds.  Technetium 42m Tetrofosmin injected at 30-seconds.  There were no significant changes with Lexiscan.  Quantitative spect images were obtained after a 45 minute delay.  Stress ECG: No significant change from baseline ECG  QPS Raw Data Images:  Normal; no motion artifact; normal heart/lung ratio. Stress Images:  Normal homogeneous uptake in all areas of the myocardium. Rest Images:  Normal homogeneous uptake in all areas of the myocardium. Subtraction (SDS):  Normal Transient Ischemic Dilatation (Normal <1.22):  1.06 Lung/Heart Ratio (Normal <0.45):  .30  Quantitative Gated Spect Images QGS EDV:  157 ml QGS ESV:  87 ml QGS cine images:  The LV function is mildly depressed QGS EF: 44%  Impression Exercise Capacity:  Lexiscan with no exercise. BP Response:  Hypertensive blood pressure response. Clinical Symptoms:  No chest pain. ECG Impression:  No significant ST segment change suggestive of ischemia. Comparison with Prior Nuclear Study: No previous nuclear study performed  Overall Impression:  Low risk  stress nuclear study.  There is no evidence of ischemia.  His LV function is mildly depressed.    Thayer Headings, Brooke Bonito., MD, [facility]

## 2011-08-26 ENCOUNTER — Other Ambulatory Visit (HOSPITAL_COMMUNITY): Payer: Self-pay | Admitting: Radiology

## 2011-08-26 MED ORDER — REGADENOSON 0.4 MG/5ML IV SOLN
0.4000 mg | Freq: Once | INTRAVENOUS | Status: AC
  Administered 2011-08-26: 0.4 mg via INTRAVENOUS

## 2011-08-26 MED ORDER — TECHNETIUM TC 99M TETROFOSMIN IV KIT
33.0000 | PACK | Freq: Once | INTRAVENOUS | Status: AC | PRN
  Administered 2011-08-26: 33 via INTRAVENOUS

## 2011-08-29 ENCOUNTER — Encounter: Payer: Self-pay | Admitting: Family Medicine

## 2011-08-29 ENCOUNTER — Ambulatory Visit (INDEPENDENT_AMBULATORY_CARE_PROVIDER_SITE_OTHER): Payer: Self-pay | Admitting: Family Medicine

## 2011-08-29 DIAGNOSIS — E119 Type 2 diabetes mellitus without complications: Secondary | ICD-10-CM

## 2011-08-29 DIAGNOSIS — R079 Chest pain, unspecified: Secondary | ICD-10-CM

## 2011-08-29 DIAGNOSIS — I10 Essential (primary) hypertension: Secondary | ICD-10-CM

## 2011-08-29 MED ORDER — METFORMIN HCL 1000 MG PO TABS: 1000.0000 mg | ORAL_TABLET | Freq: Two times a day (BID) | ORAL | Status: DC

## 2011-08-29 MED ORDER — LISINOPRIL 20 MG PO TABS: 40.0000 mg | ORAL_TABLET | Freq: Every day | ORAL | Status: DC

## 2011-08-29 MED ORDER — ACCU-CHEK MULTICLIX LANCETS MISC: Status: DC

## 2011-08-29 NOTE — Patient Instructions (Signed)
It was good seeing you today.  I am going to increase your medication for your blood pressure (lisinopril).  I would also like for you to take the Hydrochlorothiazide everyday.  Good luck with the job search and I would like for you to come back in 1 week to recheck your blood pressure and recheck your kidney function with the increase in medication.

## 2011-08-31 ENCOUNTER — Encounter: Payer: Self-pay | Admitting: Family Medicine

## 2011-08-31 DIAGNOSIS — I2510 Atherosclerotic heart disease of native coronary artery without angina pectoris: Secondary | ICD-10-CM | POA: Insufficient documentation

## 2011-08-31 DIAGNOSIS — Z794 Long term (current) use of insulin: Secondary | ICD-10-CM | POA: Insufficient documentation

## 2011-08-31 DIAGNOSIS — E1142 Type 2 diabetes mellitus with diabetic polyneuropathy: Secondary | ICD-10-CM | POA: Insufficient documentation

## 2011-08-31 HISTORY — DX: Atherosclerotic heart disease of native coronary artery without angina pectoris: I25.10

## 2011-08-31 NOTE — Assessment & Plan Note (Addendum)
Tolerating metformin, will increase to 1000mg  BID.  Counseled on dietary and exercise changes.  Recheck A1C in three months.

## 2011-08-31 NOTE — Progress Notes (Signed)
  Subjective:    Patient ID: [name], female    DOB: 1956-08-07, 55 y.o.   MRN: [id number]  HPI Here for HFU 1.Chest pain:  CP still present, but improved.  Worse with coughing, tender if she pushes along ribs.  Denies shortness of breath, pressure on her chest as she had before.  Was supposed to have ETT but she was unable to reach her target heart rate 2/2 to fatigue.  She did have a Lexiscan on 9/17 which was "Low risk stress nuclear study. There is no evidence of ischemia. Her LV function is mildly depressed at 44%." Also had a V/Q scan while in the hospital that showed intermediate probability of PE.  2.HTN:  Blood pressure has improved from when she was initially seen and when she was in the hospital.  She is taking lisinopril 20mg  but she is only taking HCTZ as needed when she feels like she has a lot of fluid.  Denies headache, weakness. 3.Diabetes:  Taking Metformin, tolerating well.  Checking sugars on her sisters meter.  Getting numbers in the 100's.      Review of Systems     Objective:   Physical Exam  Constitutional: She is oriented to person, place, and time.       Morbidly obese, NAD   HENT:  Head: Normocephalic and atraumatic.  Cardiovascular: Normal rate, regular rhythm and normal heart sounds.   Pulmonary/Chest: Effort normal and breath sounds normal. No respiratory distress. She has no wheezes. She has no rales. She exhibits tenderness.       Tenderness along lower ribs on the left side  Musculoskeletal: She exhibits edema.       1+  Neurological: She is alert and oriented to person, place, and time.          Assessment & Plan:

## 2011-08-31 NOTE — Assessment & Plan Note (Signed)
BP elevated but not at goal.  I will increase her Lisinopril to 40mg .  Also instructed to take her HCTZ daily.  She will f/u in one week to recheck her BP and check her creatinine with increase in her  lisinopril

## 2011-08-31 NOTE — Assessment & Plan Note (Signed)
Still present but improving.  I suspect this is likely musculoskeletal in nature.  She did have a VQ scan with intermediate probability of PE, however I do not think her symptoms fit the picture of a PE.  Her myoview showed low risk of ischemia.  Given red flags on when to return or go to ED

## 2011-09-02 NOTE — Discharge Summary (Signed)
[name], [name]            ACCOUNT NO.:  [id number]  MEDICAL RECORD NO.:  [id number]  LOCATION:                                 FACILITY:  PHYSICIAN:  Talbert Cage, M.D.    DATE OF BIRTH:  DATE OF ADMISSION:  08/07/2011 DATE OF DISCHARGE:  08/10/2011                              DISCHARGE SUMMARY   PRIMARY CARE PROVIDER:  Dr. Luetta Nutting, [place] .  DISCHARGE DIAGNOSES: 1. Atypical chest pain, consistent with costochondritis plus or minus     anxiety.  Acute coronary syndrome ruled out. 2. Newly diagnosed type 2 diabetes.  DISCHARGE MEDICATIONS:  New medications: 1. Ibuprofen 800 mg p.o. t.i.d. p.r.n. chest pain. 2. Lisinopril 20 mg p.o. daily. 3. Metformin 500 mg p.o. b.i.d. with meals. 4. Percocet 5/325 p.o. q.4 h. p.r.n. chest pain. 5. Simvastatin 20 mg p.o. daily. 6. Spironolactone 12.5 mg p.o. daily.  Home medications: 1. Aspirin 81. 2. Hydrochlorothiazide 25 mg p.o. q.a.m.  CONSULTS:  Cardiology, Dr. Glori Bickers.  PROCEDURES: 1. A 2-D echo showed an EF of 50-55% with grade I diastolic     dysfunction. 2. V/Q scan pending. 3. Chest x-ray showed cardiomegaly with pulmonary venous hypertension.  BRIEF HOSPITAL COURSE:  This is a 55 year old female with a history of type 2 diabetes, hyperlipidemia, history of CVA in 2005, hypertension, and tobacco abuse, who presented with chest pain and admitted for ACS rule out. 1. Atypical chest pain.  ACS was ruled out with unchanged serial EKGs     x2 and negative serial cardiac enzymes.  The patient was given     nitroglycerin for the chest pain.  The chest pain seemed to be     consistent with costochondritis with a possible anxiety component.     The chest pain was left-sided, nonexertional and reproducible.     Cardiology was consulted early during the patient's admission and     they were concerned about a possible pulmonary embolus.  D-dimer     was slightly elevated at  0.52 and a V/Q scan was ordered and     performed.  Since the patient's chest pain had improved     significantly and the patient was not having any respiratory     symptoms, the pretest probability for pulmonary embolus was low and     since the V/Q scan would not be read for the next couple of days     due to the spring holiday weekend, the patient was sent home with a     V/Q scan readings still pending.  If the V/Q scan did show high     probability of a pulmonary embolus, the primary team discussed with     the patient that we would contact her by phone and bring her into     the hospital. 2. Newly diagnosed type 2 diabetes.  The patient's hemoglobin A1c was     7.0 on admission.  The patient was started on metformin. 3. Grade 1 diastolic heart failure and hypertension.  The patient was     started on lisinopril, HCTZ, and spironolactone during this     hospitalization.  The patient's blood pressures  were in the     140s/60s at the time of discharge.  DISCHARGE INSTRUCTIONS:  The patient was advised to increase activity slowly and to implement a low-sodium heart-healthy diet.  The patient was asked to call her PCP or return to the ED if she experienced persistent chest pain with shortness of breath, dizziness, sweating, or excessive weakness.  FOLLOWUP APPOINTMENTS:  The patient was asked to make a follow-up appointment with her PCP, Dr. Luetta Nutting, in 1-2 weeks and with [facility] in 1-2 weeks.  [facility] will call the patient and schedule an outpatient follow-up and stress test.  DISCHARGE CONDITION:  The patient was discharged home in stable medical condition.  ADDENDUM:  V/Q scan showed a perfusion defect involving the mid left lung without corresponding ventilation defect.  I suspect technique related/artifactual.  Intermediate probability for pulmonary embolism. Due to the low specificity of V/Q scans, with only an intermediate probability for  pulmonary embolism, and the absence of clinical signs and symptoms of a pulmonary embolus, we will discharge the patient home and give her red flags for signs of DVT and PE.    ______________________________ Karen Kays, MD   ______________________________ Talbert Cage, M.D.    AO/MEDQ  D:  08/10/2011  T:  08/11/2011  Job:  [id number]  cc:   Luetta Nutting, MD [facility]  Electronically Signed by Karen Kays MD on 08/21/2011 11:09:36 AM Electronically Signed by Talbert Cage M.D. on 09/02/2011 11:09:24 AM

## 2011-09-09 ENCOUNTER — Encounter: Payer: Self-pay | Admitting: Family Medicine

## 2011-09-09 ENCOUNTER — Ambulatory Visit (INDEPENDENT_AMBULATORY_CARE_PROVIDER_SITE_OTHER): Payer: Self-pay | Admitting: Family Medicine

## 2011-09-09 VITALS — BP 150/90 | HR 85 | Temp 98.2°F | Wt 273.0 lb

## 2011-09-09 DIAGNOSIS — I10 Essential (primary) hypertension: Secondary | ICD-10-CM

## 2011-09-09 DIAGNOSIS — E119 Type 2 diabetes mellitus without complications: Secondary | ICD-10-CM

## 2011-09-09 MED ORDER — AMLODIPINE BESYLATE 5 MG PO TABS: 5.0000 mg | ORAL_TABLET | Freq: Every day | ORAL | Status: DC

## 2011-09-09 NOTE — Assessment & Plan Note (Signed)
Initial assessment with automatic blood pressure cuff showed poor control of her blood pressure however recheck manually showed better control. She is still not optimally controlled given her other comorbidities. Her goal is 130/80 or less. I will add amlodipine and have her return in 2 weeks for recheck of blood pressure. I will recheck a basic metabolic panel today given her increase in lisinopril recently.

## 2011-09-09 NOTE — Progress Notes (Signed)
  Subjective:    Patient ID: [name], female    DOB: 08/18/1956, 55 y.o.   MRN: [id number]  HPI 1. Hypertension: Patient is here to followup on her hypertension. Recently increased her lisinopril from 20 mg to 40 mg. She has been checking blood pressure at the drug store and getting numbers in the [id number] systolic. She still has some mild chest pain that is similar to the chest pain she has had over the past month but this is improving. She denies headache, nausea, weakness, vomiting, palpitations, chest pressure.   2.  type 2 diabetes mellitus: Recently diagnosed with type 2 diabetes mellitus the hospitalization. At time of hospitalization she was started on metformin. Increase to 1000 mg twice a day at last appointment. She has been unable to tolerate this as it is causing nausea. She did decrease this back to 500 mg twice a day. She denies diarrhea with this. Review of Systems     Objective:   Physical Exam  generally: Morbidly obese but in no acute distress Cardiovascular: RRR, no murmur, no rub, no gallop. She still exhibits tenderness along the lower ribs on the left side. I rechecked the blood pressure and it was 150/90 manually. Pulmonary: Clear to auscultation bilaterally no wheezes or increased work of breathing. Extremities: 1+ edema bilaterally no calf tenderness.       Assessment & Plan:

## 2011-09-09 NOTE — Assessment & Plan Note (Signed)
Unable to tolerate increasing metformin to 1000 mg. I will have her continue the 500 mg by mouth twice a day for now. We'll check an A1c in 2 months to see how well controlled she is. May need to add a sulfonylurea at that time if she is not well controlled.

## 2011-09-09 NOTE — Patient Instructions (Signed)
It was good seeing you today.  Your blood pressure continues to look better, but we aren't quite there yet.  I have sent over a prescription for a new medication called amlodipine to your pharmacy.  Please take this once per day.  I would like to see you back in two weeks to recheck your blood pressure.

## 2011-09-10 LAB — BASIC METABOLIC PANEL
BUN: 22 mg/dL (ref 6–23)
CO2: 28 mEq/L (ref 19–32)
Calcium: 9.4 mg/dL (ref 8.4–10.5)
Chloride: 101 mEq/L (ref 96–112)
Creat: 1.33 mg/dL — ABNORMAL HIGH (ref 0.50–1.10)
Glucose, Bld: 153 mg/dL — ABNORMAL HIGH (ref 70–99)
Potassium: 4.7 mEq/L (ref 3.5–5.3)
Sodium: 141 mEq/L (ref 135–145)

## 2011-09-23 ENCOUNTER — Encounter: Payer: Self-pay | Admitting: Family Medicine

## 2011-09-23 ENCOUNTER — Ambulatory Visit (INDEPENDENT_AMBULATORY_CARE_PROVIDER_SITE_OTHER): Payer: Self-pay | Admitting: Family Medicine

## 2011-09-23 DIAGNOSIS — R51 Headache: Secondary | ICD-10-CM

## 2011-09-23 DIAGNOSIS — R519 Headache, unspecified: Secondary | ICD-10-CM | POA: Insufficient documentation

## 2011-09-23 DIAGNOSIS — I10 Essential (primary) hypertension: Secondary | ICD-10-CM

## 2011-09-23 MED ORDER — AMLODIPINE BESYLATE 10 MG PO TABS: 10.0000 mg | ORAL_TABLET | Freq: Every day | ORAL | Status: DC

## 2011-09-23 NOTE — Assessment & Plan Note (Signed)
Blood pressure continues to remain elevated despite 3 medications. Will increase her amlodipine to 10 mg. Blood pressure rechecked with larger thigh cuff to to body habitus and was 140/88, however  I am not sure that this is entirely accurate. I will see her back in 2 weeks to recheck her blood pressure with the increase in amlodipine.

## 2011-09-23 NOTE — Assessment & Plan Note (Signed)
Seems to be tension related or possibly due to her uncontrolled hypertension. No neurological deficits or red flags at this time. Does not seem migrainous in nature. She may continue Tylenol for the pain. Reviewed red flags to look for home for when to call us back or go to the emergency room. Advised she avoid ibuprofen given that she is on ACE inhibitor and already has mild renal insufficiency.

## 2011-09-23 NOTE — Progress Notes (Signed)
  Subjective:    Patient ID: [name], female    DOB: [birth year], 55 y.o.   MRN: [id number]  HPI 1. Hypertension: Patient is here for followup of hypertension. Current therapy includes lisinopril 40 mg, hydrochlorothiazide 25 mg, and amlodipine 5 mg. She says she has been compliant with all medications. Hasn't been checking blood pressure at home recently. She has been under increased stress due to her sister being sick recently. She does report a headache today. She denies chest pain, shortness of breath, palpitations, nausea, weakness. 2. Headache: Reports having headache for the past 3 days. Headache feels like it is all over her and feels like a helmet. Denies nausea or sensitivity to light with this. As above has been under increased amount of stress. She has been sleeping okay. She is currently taking Tylenol for her headache which seemed to help some. She denies any areas of we'll weakness, numbness, blurred vision.   Review of Systems     Objective:   Physical Exam Generally: Morbidly obese, no acute distress  HEENT: PERRL, EOMI. Oropharynx is clear and moist. CV: Heart is regular rate and rhythm, no murmurs, no rubs, no gallops. Pulmonary: Lungs are clear to auscultation bilaterally Neuro: Cranial nerves II through XII intact. Strength 5/5, sensation intact throughout.       Assessment & Plan:

## 2011-09-23 NOTE — Patient Instructions (Signed)
It was good seeing you today.  Please increase your amlodipine to 10mg  per day.  You can take two of the 5mg  every day until you run out but I will go ahead and send over a prescription for the new 10mg  tablets to your pharmacy.  I would like to see you back in two weeks to recheck your blood pressure.  For your headache you can take tylenol.  I would use caution with ibuprofen being on lisinopril to not damage your kidneys.

## 2011-09-25 ENCOUNTER — Telehealth: Payer: Self-pay | Admitting: Family Medicine

## 2011-09-25 NOTE — Telephone Encounter (Signed)
Patient went to pharmacy with rx given and was told it was 60.00.  Cannot afford this.  Need provider to call her back to let her know if there would be something else to give her.  Patient saw an episode on Dr. Irena Cords discussing something that she likened to what she's been experiencing with her chest.  She looked it up and had 4 of the symptoms. Feels concerned about it.

## 2011-09-25 NOTE — Telephone Encounter (Signed)
Pt states the amlodipine is too expensive. Cannot afford. Is there something cheaper? O/W she said she will continue the HCTZ and lisinopril until she sees you at the end of the month.

## 2011-09-28 NOTE — Telephone Encounter (Signed)
I would really like for her to be on this medication to get her BP under better control.  It looks like [facility] has amlodipine for $9.99 for 30 day supply, $15.99 for 90 day supply.  I can send in to [facility] if she would like for me to to do that.   Unsure what she is referring to that she saw on Dr. Irena Cords.  She can make appt. To discuss more details if she is concerned

## 2011-09-29 ENCOUNTER — Telehealth: Payer: Self-pay | Admitting: *Deleted

## 2011-09-29 DIAGNOSIS — I10 Essential (primary) hypertension: Secondary | ICD-10-CM

## 2011-09-29 MED ORDER — AMLODIPINE BESYLATE 10 MG PO TABS
10.0000 mg | ORAL_TABLET | Freq: Every day | ORAL | Status: DC
Start: 2011-09-29 — End: 2012-02-18

## 2011-09-29 NOTE — Telephone Encounter (Signed)
Called pt. Pt wants amlodipine send to [facility] [address]. Done. [name], [name]

## 2011-10-08 ENCOUNTER — Encounter: Payer: Self-pay | Admitting: Family Medicine

## 2011-10-08 ENCOUNTER — Ambulatory Visit (INDEPENDENT_AMBULATORY_CARE_PROVIDER_SITE_OTHER): Payer: Self-pay | Admitting: Family Medicine

## 2011-10-08 DIAGNOSIS — J3489 Other specified disorders of nose and nasal sinuses: Secondary | ICD-10-CM

## 2011-10-08 DIAGNOSIS — M79604 Pain in right leg: Secondary | ICD-10-CM

## 2011-10-08 DIAGNOSIS — M545 Low back pain, unspecified: Secondary | ICD-10-CM

## 2011-10-08 DIAGNOSIS — M79605 Pain in left leg: Secondary | ICD-10-CM

## 2011-10-08 DIAGNOSIS — I10 Essential (primary) hypertension: Secondary | ICD-10-CM

## 2011-10-08 MED ORDER — GABAPENTIN 300 MG PO CAPS: 300.0000 mg | ORAL_CAPSULE | Freq: Every day | ORAL | Status: DC

## 2011-10-08 NOTE — Patient Instructions (Signed)
It was good seeing you again.  I would like to see you back in one month, once you talk with debra hill and get enrolled in the medication assistance program.  Try using afrin for your nose, I would not use more than 2-3 x per week.  If this does not improve we can investigate further. Have a great day and call with any questions.

## 2011-10-10 ENCOUNTER — Other Ambulatory Visit: Payer: Self-pay | Admitting: Family Medicine

## 2011-10-10 DIAGNOSIS — Z1231 Encounter for screening mammogram for malignant neoplasm of breast: Secondary | ICD-10-CM

## 2011-10-19 DIAGNOSIS — M545 Low back pain: Secondary | ICD-10-CM | POA: Insufficient documentation

## 2011-10-19 DIAGNOSIS — M79605 Pain in left leg: Secondary | ICD-10-CM | POA: Insufficient documentation

## 2011-10-19 DIAGNOSIS — G9601 Cranial cerebrospinal fluid leak, spontaneous: Secondary | ICD-10-CM | POA: Insufficient documentation

## 2011-10-19 NOTE — Assessment & Plan Note (Addendum)
Likely vasomotor rhinitis, no other signs of allergies or infectious cause. It is possible that this could be at CSF leak, although I feel this is less likely as symptoms wax and wane.  If symptoms do not improve or become more persistent, will consider sending discharge off to be examined to see if this is CSF.  Will have her try afrin PRN for now, warned of afrin overuse.

## 2011-10-19 NOTE — Progress Notes (Signed)
  Subjective:    Patient ID: [name], female    DOB: [birth year], 55 y.o.   MRN: [id number]  HPI 1. HTN:  BP still elevated, states she gets better numbers at home.  She has been having some increased lower back pain recently and thinks her pain is contributing to her blood pressure being high.  She has gotten the norvasc filled and continues to take her lisinopril and hctz.  She does get frequent headaches but denies chest pain, palpitations, nausea, weakness.  2. Low back pain:  Has had increased LBP over the past couple of weeks.  Pain comes and goes and is worse with walking or standing still for long periods of time. It is relieved by sitting or lying down.  Sometimes pain does radiated down the back of the leg and feels like "electric shock".  She does not have any weakness or numbness in the legs and no change in bowel or bladder.   3. Nasal drainage:  Has had clear nasal drainage off and on for the past few weeks.  Copious at times.  Saw on Dr. Irena Cords that this could be a sign of spinal fluid leaking, since she also is having headaches.  Nasal drainage and headaches typically do not usually occur at the same time.  Has not noticed any exacerbating factors that increase nasal drainage.  Denies any itchy eyes or scratchy throat.  No history of allergies. States fluid tastes salty.    Review of Systems     Objective:   Physical Exam  Constitutional:       Obese, NAD  HENT:  Head: Normocephalic and atraumatic.  Nose: Mucosal edema present. No rhinorrhea. Right sinus exhibits no maxillary sinus tenderness and no frontal sinus tenderness. Left sinus exhibits no maxillary sinus tenderness and no frontal sinus tenderness.  Mouth/Throat: Oropharynx is clear and moist.  Cardiovascular: Normal rate, regular rhythm and normal heart sounds.  Exam reveals no gallop and no friction rub.   No murmur heard. Pulmonary/Chest: Effort normal and breath sounds normal.  Musculoskeletal:       Trace  edema SLR limited by mild pain and decreased flexibility, no radicular signs.   Unable to tolerate FABER 2/2 to body habitus DTR 2+ bilaterally Sensation intact Mild spasm on R around L4  Skin: Skin is warm and dry.          Assessment & Plan:

## 2011-10-19 NOTE — Assessment & Plan Note (Signed)
BP still not well controlled, although pain may be making worse.  I will try to get her pain under better control and see her back in 2-4 weeks to see if this has improved.  If not will consider increasing ACE-I or addition of a fourth agent.

## 2011-10-23 ENCOUNTER — Ambulatory Visit: Payer: Self-pay | Admitting: Family Medicine

## 2011-10-24 ENCOUNTER — Ambulatory Visit: Payer: Self-pay | Admitting: Family Medicine

## 2011-11-06 ENCOUNTER — Ambulatory Visit (HOSPITAL_COMMUNITY)
Admission: RE | Admit: 2011-11-06 | Discharge: 2011-11-06 | Disposition: A | Discharge status: Home / Self Care | Payer: Self-pay | Source: Ambulatory Visit | Attending: Family Medicine | Admitting: Family Medicine

## 2011-11-06 DIAGNOSIS — Z1231 Encounter for screening mammogram for malignant neoplasm of breast: Secondary | ICD-10-CM | POA: Insufficient documentation

## 2011-11-07 ENCOUNTER — Ambulatory Visit (HOSPITAL_COMMUNITY)
Admission: RE | Admit: 2011-11-07 | Discharge: 2011-11-07 | Disposition: A | Discharge status: Home / Self Care | Payer: Self-pay | Source: Ambulatory Visit | Attending: Family Medicine | Admitting: Family Medicine

## 2011-11-07 ENCOUNTER — Ambulatory Visit (INDEPENDENT_AMBULATORY_CARE_PROVIDER_SITE_OTHER): Payer: Self-pay | Admitting: Family Medicine

## 2011-11-07 ENCOUNTER — Encounter: Payer: Self-pay | Admitting: Family Medicine

## 2011-11-07 VITALS — BP 164/81 | HR 94 | Ht 63.0 in | Wt 266.7 lb

## 2011-11-07 DIAGNOSIS — J3489 Other specified disorders of nose and nasal sinuses: Secondary | ICD-10-CM

## 2011-11-07 DIAGNOSIS — R059 Cough, unspecified: Secondary | ICD-10-CM

## 2011-11-07 DIAGNOSIS — E785 Hyperlipidemia, unspecified: Secondary | ICD-10-CM

## 2011-11-07 DIAGNOSIS — I10 Essential (primary) hypertension: Secondary | ICD-10-CM

## 2011-11-07 DIAGNOSIS — F17200 Nicotine dependence, unspecified, uncomplicated: Secondary | ICD-10-CM | POA: Insufficient documentation

## 2011-11-07 DIAGNOSIS — E119 Type 2 diabetes mellitus without complications: Secondary | ICD-10-CM

## 2011-11-07 DIAGNOSIS — R05 Cough: Secondary | ICD-10-CM | POA: Insufficient documentation

## 2011-11-07 DIAGNOSIS — R0602 Shortness of breath: Secondary | ICD-10-CM | POA: Insufficient documentation

## 2011-11-07 LAB — POCT GLYCOSYLATED HEMOGLOBIN (HGB A1C): Hemoglobin A1C: 8.4

## 2011-11-07 MED ORDER — METFORMIN HCL 1000 MG PO TABS: 1000.0000 mg | ORAL_TABLET | Freq: Two times a day (BID) | ORAL | Status: DC

## 2011-11-07 MED ORDER — PRAVASTATIN SODIUM 40 MG PO TABS: 40.0000 mg | ORAL_TABLET | Freq: Every evening | ORAL | Status: DC

## 2011-11-07 MED ORDER — LOSARTAN POTASSIUM 25 MG PO TABS: 25.0000 mg | ORAL_TABLET | Freq: Every day | ORAL | Status: DC

## 2011-11-07 MED ORDER — HYDROCHLOROTHIAZIDE 25 MG PO TABS: 25.0000 mg | ORAL_TABLET | Freq: Every day | ORAL | Status: DC

## 2011-11-07 MED ORDER — GABAPENTIN 300 MG PO CAPS: 300.0000 mg | ORAL_CAPSULE | Freq: Every day | ORAL | Status: DC

## 2011-11-07 MED ORDER — GLIPIZIDE 5 MG PO TABS: 5.0000 mg | ORAL_TABLET | Freq: Two times a day (BID) | ORAL | Status: DC

## 2011-11-07 NOTE — Patient Instructions (Signed)
It was good seeing you today. I would like to switch one of your lisinopril to losartan because lisinopril may be contributing to your cough, please see me in two weeks to see how your blood pressure is doing on the new medication I will let you know the results of your labs when i get them.  Please start taking the glipizide for your diabetes

## 2011-11-08 LAB — BRAIN NATRIURETIC PEPTIDE: Brain Natriuretic Peptide: 43.7 pg/mL (ref 0.0–100.0)

## 2011-11-09 NOTE — Assessment & Plan Note (Signed)
BP elevated but better than previous, also off lisinopril currently.  Will add back on ARB since having cough, possibly from ACE-I.  Return in two weeks to see if improving.

## 2011-11-09 NOTE — Assessment & Plan Note (Addendum)
Control is worse than previous, will add glipizide.  She will try to get strips and check more often.

## 2011-11-09 NOTE — Progress Notes (Signed)
  Subjective:    Patient ID: [name], female    DOB: [birth year], 55 y.o.   MRN: [id number]  HPI 1. HTN:  BP remains elevated.  Checking at drug store occasionally, numbers vary.  She has recently discontinued her lisinopril because she had been feeling dizzy and thinks that this has been contributing to this.  Denies chest pain,  2. Diabetes:  Still taking metformin.  Has not been checking sugars lately because she had ran out of strips.  Prior to running out of strips she was reports getting numbers in the 160-180 range fasting.  She is tolerating metformin well.   3. Rhinorrhea:  States she continues to have rhinorrhea on a daily basis.  Reports fluid is copious amounts of clear fluid.  Fluid "pours" out of her nose when she bends over.  She has also had headaches associated with this and tingling in her fingertips.  The fluid is has a salty taste and leaves a metallic taste in her mouth when it starts coming from her nose.  She denies and fever, chills, weakness, neck stiffness.   4. Cough:  Has had cough over the past couple of months.  Cough is dry, hacky cough that is worse at night.   Feels like cough is worse when she lays flat .  Denies shortness of breath, wheezing.  Has gotten some better since discontinuation of lisinopril   Review of Systems     Objective:   Physical Exam  Constitutional:       Obese female, nad  HENT:  Head: Normocephalic and atraumatic.  Mouth/Throat: Oropharynx is clear and moist.       Mucosal edema normal.  Upon leaning over she has large amount of clear, thin fluid that drains from the L nostril.  No blood.  Neck: Full passive range of motion without pain.  Cardiovascular: Normal rate, regular rhythm and S1 normal.   Pulmonary/Chest: Effort normal and breath sounds normal.  Musculoskeletal:       1+ edema in LE          Assessment & Plan:

## 2011-11-09 NOTE — Assessment & Plan Note (Addendum)
I think cough is likely related to ACE-I, however she does have cardiomegaly on previous CXR and hx of elevated BNP.  Will recheck BNP and cxr today to be sure no CHF component to cough.  Will change lisinopril to losartan, start at 25mg  and titrate up

## 2011-11-09 NOTE — Assessment & Plan Note (Signed)
Symptoms persistent, I am becoming more concerned that this may be a spontaneous CSF leak, given other symptoms that accompany including headaches, tingling and metallic taste in mouth.  Fluid collected in sterile container to be sent for Beta-2 Transferrin assay to determine if this is CSF.  Will likely need CT head if positive.

## 2011-11-10 ENCOUNTER — Telehealth: Payer: Self-pay | Admitting: Family Medicine

## 2011-11-10 ENCOUNTER — Other Ambulatory Visit: Payer: Self-pay | Admitting: Family Medicine

## 2011-11-10 DIAGNOSIS — J3489 Other specified disorders of nose and nasal sinuses: Secondary | ICD-10-CM

## 2011-11-10 LAB — BETA 2 TRANSFERRIN: Beta-2 Transferrin, Fluid: DETECTED — AB

## 2011-11-10 NOTE — Telephone Encounter (Signed)
Called to give patient results of recent testing.  Left message with sister to have her call me back.  Sister states she is in [place] for the next two days.  No cell phone to reach her at.  Told her to have her call asap once she gets back.

## 2011-11-10 NOTE — Telephone Encounter (Signed)
Pt returned call and would like for Dr Zigmund Daniel to call her at [phone]

## 2011-11-10 NOTE — Telephone Encounter (Signed)
Called [name] at number given and gave her results of recent tests.  1.For cough, BNP and CXR were normal.  Likely postnasal drip causing this, possibly from CSF drainage.  Will send in rx for tesalon perles to see if this helps.  2.  Mammo results show possible R mass.  No previous results to compare to.  [facility]ast center will contact her, will likely need [place].    3.  Rhinorrhea:  Beta-2 transferrin positive for CSF.  Will send for CT.  Patient will not be back from [facility] until Sunday.  Explained to her that she is at higher risk for infection from this and given red flags for her and family members to watch out for.  Explained that urgent that we get CT done and get this repaired.

## 2011-11-10 NOTE — Progress Notes (Signed)
Received results of Beta-2 Transferrin which was positive. Spontaneous CSF leak likely.    Will order CT to evaluate for area of CSF leak.

## 2011-11-12 ENCOUNTER — Other Ambulatory Visit: Payer: Self-pay | Admitting: Family Medicine

## 2011-11-12 DIAGNOSIS — R928 Other abnormal and inconclusive findings on diagnostic imaging of breast: Secondary | ICD-10-CM

## 2011-11-12 NOTE — Telephone Encounter (Signed)
Please have her hold metformin 24 hours before procedure and 48 hours after procedure.

## 2011-11-12 NOTE — Telephone Encounter (Signed)
Called patient and read her message from Dr Zigmund Daniel.[name], [name]

## 2011-11-12 NOTE — Progress Notes (Signed)
Addended by: Riley Churches E on: 11/12/2011 12:28 PM   Modules accepted: Orders

## 2011-11-12 NOTE — Telephone Encounter (Signed)
Patient came back to town early because of what Dr Zigmund Daniel had told her.  Wants to know when she is to have CT done for the spinal fluid that had been leaking from her nose. [facility] Pls advise

## 2011-11-12 NOTE — Telephone Encounter (Signed)
Advised pt CT sched for 12/6 at 11:00 at [facility]. Pt to arrive at 10:00 for BMET as pt is diabetic. Pt advised she needs to be NPO after midnight.

## 2011-11-13 ENCOUNTER — Ambulatory Visit (HOSPITAL_COMMUNITY)
Admission: RE | Admit: 2011-11-13 | Discharge: 2011-11-13 | Disposition: A | Discharge status: Home / Self Care | Payer: No Typology Code available for payment source | Source: Ambulatory Visit | Attending: Family Medicine | Admitting: Family Medicine

## 2011-11-13 DIAGNOSIS — J3489 Other specified disorders of nose and nasal sinuses: Secondary | ICD-10-CM

## 2011-11-14 ENCOUNTER — Other Ambulatory Visit: Payer: Self-pay | Admitting: Family Medicine

## 2011-11-14 ENCOUNTER — Telehealth: Payer: Self-pay | Admitting: *Deleted

## 2011-11-14 DIAGNOSIS — J3489 Other specified disorders of nose and nasal sinuses: Secondary | ICD-10-CM

## 2011-11-14 DIAGNOSIS — R51 Headache: Secondary | ICD-10-CM

## 2011-11-14 NOTE — Telephone Encounter (Signed)
Left message with mom for patient to return call today before 5 pm. She has appointment for CT at [facility] Monday morning at 7:45 am. She also has an appointment with [facility] Tuesday at [place], [name]

## 2011-11-14 NOTE — Progress Notes (Signed)
Spoke with Dr. Erik Obey at [facility].  Recommended getting CT of head and sinuses to evaluate for bony defect and area of cranial leak.  Will also need to get neuro involved as one of the most common causes is pseudotumor.  Will send referral for urgent ENT and Neuro referral.

## 2011-11-14 NOTE — Telephone Encounter (Signed)
Patient returned call and was given message.[place], [name]

## 2011-11-17 ENCOUNTER — Ambulatory Visit (HOSPITAL_COMMUNITY)
Admission: RE | Admit: 2011-11-17 | Discharge: 2011-11-17 | Disposition: A | Discharge status: Home / Self Care | Payer: Self-pay | Source: Ambulatory Visit | Attending: Family Medicine | Admitting: Family Medicine

## 2011-11-17 DIAGNOSIS — J322 Chronic ethmoidal sinusitis: Secondary | ICD-10-CM | POA: Insufficient documentation

## 2011-11-17 DIAGNOSIS — R51 Headache: Secondary | ICD-10-CM | POA: Insufficient documentation

## 2011-11-17 DIAGNOSIS — J3489 Other specified disorders of nose and nasal sinuses: Secondary | ICD-10-CM | POA: Insufficient documentation

## 2011-11-17 DIAGNOSIS — J323 Chronic sphenoidal sinusitis: Secondary | ICD-10-CM | POA: Insufficient documentation

## 2011-11-18 ENCOUNTER — Other Ambulatory Visit: Payer: Self-pay | Admitting: Family Medicine

## 2011-11-18 ENCOUNTER — Telehealth: Payer: Self-pay | Admitting: *Deleted

## 2011-11-18 DIAGNOSIS — R51 Headache: Secondary | ICD-10-CM

## 2011-11-18 NOTE — Telephone Encounter (Signed)
Left message with mom for patient to return call.[name], [name]

## 2011-11-18 NOTE — Telephone Encounter (Signed)
Received call from [employer], [place].  Received call from Herbie Baltimore, [place] last week requesting referral for CSF leaking from nose.  No appt available until December 10, 2011.  Spoke with Herbie Baltimore & patient will be seeing ENT today and is being referred to [facility] to be evaluated.  Will not need referral to [facility] at this time.  Nolene Ebbs, RN

## 2011-11-18 NOTE — Telephone Encounter (Signed)
Received call from [facility], Tonja was there for her appointment and was notified over the phone about her appointment for opthalmology tomorrow. She was told the visit will cost $250. She does not have to pay the whole amount up front but will have to pay something in order to be seen.[place], [name]

## 2011-11-20 ENCOUNTER — Ambulatory Visit (INDEPENDENT_AMBULATORY_CARE_PROVIDER_SITE_OTHER): Payer: Self-pay | Admitting: Family Medicine

## 2011-11-20 ENCOUNTER — Encounter: Payer: Self-pay | Admitting: Family Medicine

## 2011-11-20 VITALS — BP 170/89 | HR 101 | Temp 98.0°F | Ht 63.0 in | Wt 266.0 lb

## 2011-11-20 DIAGNOSIS — R51 Headache: Secondary | ICD-10-CM

## 2011-11-20 DIAGNOSIS — G9601 Cranial cerebrospinal fluid leak, spontaneous: Secondary | ICD-10-CM

## 2011-11-20 MED ORDER — TRAMADOL HCL 50 MG PO TABS: 50.0000 mg | ORAL_TABLET | Freq: Four times a day (QID) | ORAL | Status: DC | PRN

## 2011-11-25 ENCOUNTER — Ambulatory Visit
Admission: RE | Admit: 2011-11-25 | Discharge: 2011-11-25 | Disposition: A | Discharge status: Home / Self Care | Payer: Self-pay | Source: Ambulatory Visit | Attending: Family Medicine | Admitting: Family Medicine

## 2011-11-25 ENCOUNTER — Telehealth: Payer: Self-pay | Admitting: *Deleted

## 2011-11-25 ENCOUNTER — Other Ambulatory Visit (HOSPITAL_COMMUNITY): Payer: Self-pay | Admitting: Otolaryngology

## 2011-11-25 DIAGNOSIS — R928 Other abnormal and inconclusive findings on diagnostic imaging of breast: Secondary | ICD-10-CM

## 2011-11-25 DIAGNOSIS — G9601 Cranial cerebrospinal fluid leak, spontaneous: Secondary | ICD-10-CM

## 2011-11-25 NOTE — Telephone Encounter (Signed)
Left message with mom about [name]'s appointment with the neurosurgeon.Busick, [name]

## 2011-11-26 ENCOUNTER — Telehealth: Payer: Self-pay | Admitting: Family Medicine

## 2011-11-26 NOTE — Telephone Encounter (Signed)
Called and informed patient of appointment with [facility] 11/27/11 at 11:30.[place], [name]

## 2011-11-26 NOTE — Telephone Encounter (Signed)
Her mother didn't write down when appt for neuro is and she is asking for Herbie Baltimore to call her back

## 2011-11-27 ENCOUNTER — Telehealth: Payer: Self-pay | Admitting: Family Medicine

## 2011-11-27 NOTE — Telephone Encounter (Signed)
Pt has MRI tomorrow and she is claustophobic - she wants something to put her to sleep or have an open MRI

## 2011-11-27 NOTE — Telephone Encounter (Signed)
Called in 1 mg Ativan to [facility]. Informed patient of Rx sent and to take 30 minutes prior to MRI.Candy Leverett, [name]

## 2011-11-28 ENCOUNTER — Ambulatory Visit (HOSPITAL_COMMUNITY)
Admission: RE | Admit: 2011-11-28 | Discharge: 2011-11-28 | Disposition: A | Discharge status: Home / Self Care | Payer: Self-pay | Source: Ambulatory Visit | Attending: Otolaryngology | Admitting: Otolaryngology

## 2011-11-28 ENCOUNTER — Other Ambulatory Visit (HOSPITAL_COMMUNITY): Payer: Self-pay | Admitting: Otolaryngology

## 2011-11-28 DIAGNOSIS — F40298 Other specified phobia: Secondary | ICD-10-CM | POA: Insufficient documentation

## 2011-11-28 DIAGNOSIS — G9601 Cranial cerebrospinal fluid leak, spontaneous: Secondary | ICD-10-CM | POA: Insufficient documentation

## 2011-11-28 DIAGNOSIS — Z538 Procedure and treatment not carried out for other reasons: Secondary | ICD-10-CM | POA: Insufficient documentation

## 2011-11-28 LAB — CREATININE, SERUM
Creatinine, Ser: 1.33 mg/dL — ABNORMAL HIGH (ref 0.50–1.10)
GFR calc Af Amer: 51 mL/min — ABNORMAL LOW (ref >90)
GFR calc non Af Amer: 44 mL/min — ABNORMAL LOW (ref >90)

## 2011-12-02 NOTE — Assessment & Plan Note (Signed)
Followed by ENT who recommends neurosurgery be consulted as they typically work together on these cases.  Will place referral to try to find neurosurgeson.  No red flags today, given red flags to look for at home.  WIll f/u once seen by neurosurgery and ENT again.

## 2011-12-02 NOTE — Progress Notes (Signed)
  Subjective:    Patient ID: [name], female    DOB: Oct 13, 1956, 55 y.o.   MRN: [id number]  HPI 1. F/u CSF leak:  Here to follow up on CSF leak since visit with ENT.  Told by ENT that it appears she has a small bony deformity where CSF is coming through.   Recommended MRI for further evaluation. Still with headaches and persistent rhinorrhea. Also seen by ophthamology for dilated eye exam to eval to papilledema as pseudotumor is major cause of spontaneous csf leak.  No papilledema seen but she states there are some blood vessels damaged and some blood "behind the eye".  She is supposed to followed up for this in 6 months.      Review of Systems     Objective:   Physical Exam  Constitutional: She is oriented to person, place, and time.       Obese, nad   HENT:  Head: Normocephalic and atraumatic.  Mouth/Throat: Oropharynx is clear and moist.  Eyes: Pupils are equal, round, and reactive to light.  Cardiovascular: Normal rate, regular rhythm and normal heart sounds.   Musculoskeletal:       1+ LE edema   Neurological: She is alert and oriented to person, place, and time.          Assessment & Plan:

## 2011-12-22 ENCOUNTER — Other Ambulatory Visit: Payer: Self-pay | Admitting: Otolaryngology

## 2011-12-22 DIAGNOSIS — G9601 Cranial cerebrospinal fluid leak, spontaneous: Secondary | ICD-10-CM

## 2011-12-29 ENCOUNTER — Ambulatory Visit
Admission: RE | Admit: 2011-12-29 | Discharge: 2011-12-29 | Disposition: A | Discharge status: Home / Self Care | Payer: PRIVATE HEALTH INSURANCE | Source: Ambulatory Visit | Attending: Otolaryngology | Admitting: Otolaryngology

## 2011-12-29 DIAGNOSIS — G9601 Cranial cerebrospinal fluid leak, spontaneous: Secondary | ICD-10-CM

## 2011-12-29 MED ORDER — GADOBENATE DIMEGLUMINE 529 MG/ML IV SOLN
10.0000 mL | Freq: Once | INTRAVENOUS | Status: AC | PRN
  Administered 2011-12-29: 10 mL via INTRAVENOUS

## 2012-01-01 ENCOUNTER — Telehealth: Payer: Self-pay | Admitting: Family Medicine

## 2012-01-01 NOTE — Telephone Encounter (Signed)
Called pt. Advised that Dr.Matthews will look over the report and then call her. Pt agreed.  Fwd. To Dr.Matthews .[place]

## 2012-01-01 NOTE — Telephone Encounter (Signed)
Patient is calling for the results of her MRI

## 2012-01-02 NOTE — Telephone Encounter (Signed)
Discussed results of MRI with patient based on MRI read.  Will need to f/u with ENT to discuss how this will best be treated.   Overall doing well.

## 2012-01-21 ENCOUNTER — Encounter (HOSPITAL_COMMUNITY): Payer: Self-pay | Admitting: Pharmacy Technician

## 2012-01-26 ENCOUNTER — Other Ambulatory Visit (HOSPITAL_COMMUNITY): Payer: Self-pay | Admitting: Otolaryngology

## 2012-01-27 ENCOUNTER — Encounter (HOSPITAL_COMMUNITY)
Admission: RE | Admit: 2012-01-27 | Discharge: 2012-01-27 | Disposition: A | Discharge status: Home / Self Care | Payer: Self-pay | Source: Ambulatory Visit | Attending: Otolaryngology | Admitting: Otolaryngology

## 2012-01-27 ENCOUNTER — Inpatient Hospital Stay (HOSPITAL_COMMUNITY)
Admission: EM | Admit: 2012-01-27 | Discharge: 2012-02-06 | DRG: 981 | Disposition: A | Discharge status: Home / Self Care | Payer: Self-pay | Source: Ambulatory Visit | Attending: Family Medicine | Admitting: Family Medicine

## 2012-01-27 ENCOUNTER — Telehealth: Payer: Self-pay | Admitting: Family Medicine

## 2012-01-27 ENCOUNTER — Encounter (HOSPITAL_COMMUNITY): Payer: Self-pay

## 2012-01-27 DIAGNOSIS — Z9119 Patient's noncompliance with other medical treatment and regimen: Secondary | ICD-10-CM

## 2012-01-27 DIAGNOSIS — Z794 Long term (current) use of insulin: Secondary | ICD-10-CM | POA: Diagnosis present

## 2012-01-27 DIAGNOSIS — E785 Hyperlipidemia, unspecified: Secondary | ICD-10-CM

## 2012-01-27 DIAGNOSIS — E1142 Type 2 diabetes mellitus with diabetic polyneuropathy: Secondary | ICD-10-CM | POA: Diagnosis present

## 2012-01-27 DIAGNOSIS — N179 Acute kidney failure, unspecified: Secondary | ICD-10-CM | POA: Diagnosis present

## 2012-01-27 DIAGNOSIS — Z8673 Personal history of transient ischemic attack (TIA), and cerebral infarction without residual deficits: Secondary | ICD-10-CM

## 2012-01-27 DIAGNOSIS — G9601 Cranial cerebrospinal fluid leak, spontaneous: Secondary | ICD-10-CM

## 2012-01-27 DIAGNOSIS — R739 Hyperglycemia, unspecified: Secondary | ICD-10-CM

## 2012-01-27 DIAGNOSIS — N183 Chronic kidney disease, stage 3 unspecified: Secondary | ICD-10-CM | POA: Diagnosis present

## 2012-01-27 DIAGNOSIS — I10 Essential (primary) hypertension: Secondary | ICD-10-CM

## 2012-01-27 DIAGNOSIS — Q019 Encephalocele, unspecified: Secondary | ICD-10-CM

## 2012-01-27 DIAGNOSIS — E118 Type 2 diabetes mellitus with unspecified complications: Secondary | ICD-10-CM

## 2012-01-27 DIAGNOSIS — E871 Hypo-osmolality and hyponatremia: Secondary | ICD-10-CM | POA: Diagnosis present

## 2012-01-27 DIAGNOSIS — M109 Gout, unspecified: Secondary | ICD-10-CM | POA: Diagnosis not present

## 2012-01-27 DIAGNOSIS — J9819 Other pulmonary collapse: Secondary | ICD-10-CM | POA: Diagnosis not present

## 2012-01-27 DIAGNOSIS — J342 Deviated nasal septum: Secondary | ICD-10-CM | POA: Diagnosis present

## 2012-01-27 DIAGNOSIS — J3489 Other specified disorders of nose and nasal sinuses: Secondary | ICD-10-CM

## 2012-01-27 DIAGNOSIS — R197 Diarrhea, unspecified: Secondary | ICD-10-CM | POA: Diagnosis not present

## 2012-01-27 DIAGNOSIS — Z7982 Long term (current) use of aspirin: Secondary | ICD-10-CM

## 2012-01-27 DIAGNOSIS — J9620 Acute and chronic respiratory failure, unspecified whether with hypoxia or hypercapnia: Secondary | ICD-10-CM | POA: Diagnosis not present

## 2012-01-27 DIAGNOSIS — E1100 Type 2 diabetes mellitus with hyperosmolarity without nonketotic hyperglycemic-hyperosmolar coma (NKHHC): Principal | ICD-10-CM | POA: Diagnosis present

## 2012-01-27 DIAGNOSIS — M19049 Primary osteoarthritis, unspecified hand: Secondary | ICD-10-CM | POA: Diagnosis present

## 2012-01-27 DIAGNOSIS — E860 Dehydration: Secondary | ICD-10-CM

## 2012-01-27 DIAGNOSIS — E662 Morbid (severe) obesity with alveolar hypoventilation: Secondary | ICD-10-CM | POA: Diagnosis present

## 2012-01-27 DIAGNOSIS — G4733 Obstructive sleep apnea (adult) (pediatric): Secondary | ICD-10-CM | POA: Diagnosis present

## 2012-01-27 DIAGNOSIS — N289 Disorder of kidney and ureter, unspecified: Secondary | ICD-10-CM

## 2012-01-27 DIAGNOSIS — I129 Hypertensive chronic kidney disease with stage 1 through stage 4 chronic kidney disease, or unspecified chronic kidney disease: Secondary | ICD-10-CM | POA: Diagnosis present

## 2012-01-27 DIAGNOSIS — Z79899 Other long term (current) drug therapy: Secondary | ICD-10-CM

## 2012-01-27 DIAGNOSIS — G960 Cerebrospinal fluid leak: Secondary | ICD-10-CM

## 2012-01-27 DIAGNOSIS — Z91199 Patient's noncompliance with other medical treatment and regimen due to unspecified reason: Secondary | ICD-10-CM

## 2012-01-27 DIAGNOSIS — F17200 Nicotine dependence, unspecified, uncomplicated: Secondary | ICD-10-CM | POA: Diagnosis present

## 2012-01-27 HISTORY — DX: Personal history of other diseases of the musculoskeletal system and connective tissue: Z87.39

## 2012-01-27 HISTORY — DX: Hyperlipidemia, unspecified: E78.5

## 2012-01-27 HISTORY — DX: Anxiety disorder, unspecified: F41.9

## 2012-01-27 HISTORY — DX: Unspecified osteoarthritis, unspecified site: M19.90

## 2012-01-27 LAB — URINALYSIS, ROUTINE W REFLEX MICROSCOPIC
Bilirubin Urine: NEGATIVE
Glucose, UA: >1000 mg/dL — AB
Ketones, ur: NEGATIVE mg/dL
Leukocytes, UA: NEGATIVE
Nitrite: NEGATIVE
Protein, ur: NEGATIVE mg/dL
Specific Gravity, Urine: 1.025 (ref 1.005–1.030)
Urobilinogen, UA: 0.2 mg/dL (ref 0.0–1.0)
pH: 5.5 (ref 5.0–8.0)

## 2012-01-27 LAB — DIFFERENTIAL
Basophils Absolute: 0 10*3/uL (ref 0.0–0.1)
Basophils Relative: 0 % (ref 0–1)
Eosinophils Absolute: 0.3 10*3/uL (ref 0.0–0.7)
Eosinophils Relative: 3 % (ref 0–5)
Lymphocytes Relative: 18 % (ref 12–46)
Lymphs Abs: 1.9 10*3/uL (ref 0.7–4.0)
Monocytes Absolute: 0.5 10*3/uL (ref 0.1–1.0)
Monocytes Relative: 5 % (ref 3–12)
Neutro Abs: 7.8 10*3/uL — ABNORMAL HIGH (ref 1.7–7.7)
Neutrophils Relative %: 75 % (ref 43–77)

## 2012-01-27 LAB — BASIC METABOLIC PANEL
BUN: 28 mg/dL — ABNORMAL HIGH (ref 6–23)
BUN: 31 mg/dL — ABNORMAL HIGH (ref 6–23)
CO2: 28 mEq/L (ref 19–32)
CO2: 28 mEq/L (ref 19–32)
Calcium: 9.6 mg/dL (ref 8.4–10.5)
Calcium: 9.4 mg/dL (ref 8.4–10.5)
Chloride: 87 mEq/L — ABNORMAL LOW (ref 96–112)
Chloride: 85 mEq/L — ABNORMAL LOW (ref 96–112)
Creatinine, Ser: 2.07 mg/dL — ABNORMAL HIGH (ref 0.50–1.10)
Creatinine, Ser: 2.22 mg/dL — ABNORMAL HIGH (ref 0.50–1.10)
GFR calc Af Amer: 30 mL/min — ABNORMAL LOW (ref >90)
GFR calc Af Amer: 28 mL/min — ABNORMAL LOW (ref >90)
GFR calc non Af Amer: 26 mL/min — ABNORMAL LOW (ref >90)
GFR calc non Af Amer: 24 mL/min — ABNORMAL LOW (ref >90)
Glucose, Bld: 384 mg/dL — ABNORMAL HIGH (ref 70–99)
Glucose, Bld: 495 mg/dL — ABNORMAL HIGH (ref 70–99)
Potassium: 3.8 mEq/L (ref 3.5–5.1)
Potassium: 4.2 mEq/L (ref 3.5–5.1)
Sodium: 130 mEq/L — ABNORMAL LOW (ref 135–145)
Sodium: 128 mEq/L — ABNORMAL LOW (ref 135–145)

## 2012-01-27 LAB — CBC
HCT: 47.6 % — ABNORMAL HIGH (ref 36.0–46.0)
HCT: 44.2 % (ref 36.0–46.0)
Hemoglobin: 15.4 g/dL — ABNORMAL HIGH (ref 12.0–15.0)
Hemoglobin: 15 g/dL (ref 12.0–15.0)
MCH: 29.3 pg (ref 26.0–34.0)
MCH: 31.2 pg (ref 26.0–34.0)
MCHC: 32.4 g/dL (ref 30.0–36.0)
MCHC: 33.9 g/dL (ref 30.0–36.0)
MCV: 90.7 fL (ref 78.0–100.0)
MCV: 91.9 fL (ref 78.0–100.0)
Platelets: 226 10*3/uL (ref 150–400)
Platelets: 186 10*3/uL (ref 150–400)
RBC: 5.25 MIL/uL — ABNORMAL HIGH (ref 3.87–5.11)
RBC: 4.81 MIL/uL (ref 3.87–5.11)
RDW: 14.1 % (ref 11.5–15.5)
RDW: 14.3 % (ref 11.5–15.5)
WBC: 12.4 10*3/uL — ABNORMAL HIGH (ref 4.0–10.5)
WBC: 10.5 10*3/uL (ref 4.0–10.5)

## 2012-01-27 LAB — URINE MICROSCOPIC-ADD ON

## 2012-01-27 LAB — GLUCOSE, CAPILLARY
Glucose-Capillary: 569 mg/dL (ref 70–99)
Glucose-Capillary: 456 mg/dL — ABNORMAL HIGH (ref 70–99)
Glucose-Capillary: 462 mg/dL — ABNORMAL HIGH (ref 70–99)
Glucose-Capillary: 318 mg/dL — ABNORMAL HIGH (ref 70–99)

## 2012-01-27 MED ORDER — ACETAMINOPHEN 325 MG PO TABS
650.0000 mg | ORAL_TABLET | Freq: Four times a day (QID) | ORAL | Status: DC | PRN
  Administered 2012-02-05: 650 mg via ORAL
  Filled 2012-01-27: qty 2

## 2012-01-27 MED ORDER — ACETAMINOPHEN 650 MG RE SUPP: 650.0000 mg | Freq: Four times a day (QID) | RECTAL | Status: DC | PRN

## 2012-01-27 MED ORDER — METFORMIN HCL 500 MG PO TABS: 1000.0000 mg | ORAL_TABLET | Freq: Once | ORAL | Status: DC

## 2012-01-27 MED ORDER — MORPHINE SULFATE 4 MG/ML IJ SOLN
4.0000 mg | Freq: Once | INTRAMUSCULAR | Status: AC
  Administered 2012-01-27: 4 mg via INTRAVENOUS
  Filled 2012-01-27: qty 1

## 2012-01-27 MED ORDER — HEPARIN SODIUM (PORCINE) 5000 UNIT/ML IJ SOLN
5000.0000 [IU] | Freq: Three times a day (TID) | INTRAMUSCULAR | Status: DC
  Administered 2012-01-28: 5000 [IU] via SUBCUTANEOUS
  Filled 2012-01-27 (×5): qty 1

## 2012-01-27 MED ORDER — DEXTROSE-NACL 5-0.45 % IV SOLN
INTRAVENOUS | Status: DC
  Administered 2012-01-28: 02:00:00 via INTRAVENOUS

## 2012-01-27 MED ORDER — SODIUM CHLORIDE 0.9 % IV BOLUS (SEPSIS)
1000.0000 mL | Freq: Once | INTRAVENOUS | Status: AC
  Administered 2012-01-27: 1000 mL via INTRAVENOUS

## 2012-01-27 MED ORDER — DEXTROSE-NACL 5-0.45 % IV SOLN: INTRAVENOUS | Status: DC

## 2012-01-27 MED ORDER — LOSARTAN POTASSIUM 25 MG PO TABS: 25.0000 mg | ORAL_TABLET | Freq: Every day | ORAL | Status: DC

## 2012-01-27 MED ORDER — SIMVASTATIN 20 MG PO TABS
20.0000 mg | ORAL_TABLET | Freq: Every day | ORAL | Status: DC
  Administered 2012-01-28: 20 mg via ORAL
  Filled 2012-01-27 (×2): qty 1

## 2012-01-27 MED ORDER — SODIUM CHLORIDE 0.9 % IV SOLN
INTRAVENOUS | Status: DC
  Administered 2012-01-27: 21:00:00 via INTRAVENOUS

## 2012-01-27 MED ORDER — TRAMADOL HCL 50 MG PO TABS
50.0000 mg | ORAL_TABLET | Freq: Four times a day (QID) | ORAL | Status: DC | PRN
  Filled 2012-01-27: qty 2

## 2012-01-27 MED ORDER — INSULIN REGULAR BOLUS VIA INFUSION
0.0000 [IU] | Freq: Three times a day (TID) | INTRAVENOUS | Status: DC
  Administered 2012-01-28: 3.9 [IU] via INTRAVENOUS
  Filled 2012-01-27: qty 10

## 2012-01-27 MED ORDER — SODIUM CHLORIDE 0.9 % IV SOLN
INTRAVENOUS | Status: DC
  Administered 2012-01-28 – 2012-02-04 (×9): via INTRAVENOUS

## 2012-01-27 MED ORDER — SODIUM CHLORIDE 0.9 % IV SOLN
INTRAVENOUS | Status: DC
  Administered 2012-01-27: 4 [IU]/h via INTRAVENOUS
  Filled 2012-01-27: qty 1

## 2012-01-27 MED ORDER — HYDROCHLOROTHIAZIDE 25 MG PO TABS
25.0000 mg | ORAL_TABLET | Freq: Every day | ORAL | Status: DC
  Administered 2012-01-28: 25 mg via ORAL
  Filled 2012-01-27 (×2): qty 1

## 2012-01-27 MED ORDER — AMLODIPINE BESYLATE 10 MG PO TABS
10.0000 mg | ORAL_TABLET | Freq: Every day | ORAL | Status: DC
  Administered 2012-01-28 – 2012-02-06 (×9): 10 mg via ORAL
  Filled 2012-01-27 (×11): qty 1

## 2012-01-27 MED ORDER — DEXTROSE 50 % IV SOLN: 25.0000 mL | INTRAVENOUS | Status: DC | PRN

## 2012-01-27 MED ORDER — SODIUM CHLORIDE 0.9 % IV SOLN: INTRAVENOUS | Status: DC

## 2012-01-27 MED ORDER — ONDANSETRON HCL 4 MG PO TABS
4.0000 mg | ORAL_TABLET | Freq: Four times a day (QID) | ORAL | Status: DC | PRN
  Administered 2012-02-02 – 2012-02-05 (×3): 4 mg via ORAL
  Filled 2012-01-27 (×3): qty 1

## 2012-01-27 MED ORDER — ONDANSETRON HCL 4 MG/2ML IJ SOLN
4.0000 mg | Freq: Once | INTRAMUSCULAR | Status: DC
  Filled 2012-01-27: qty 2

## 2012-01-27 MED ORDER — SODIUM CHLORIDE 0.9 % IV SOLN
Freq: Once | INTRAVENOUS | Status: AC
  Administered 2012-01-27: 19:00:00 via INTRAVENOUS

## 2012-01-27 MED ORDER — ONDANSETRON HCL 4 MG/2ML IJ SOLN
4.0000 mg | Freq: Once | INTRAMUSCULAR | Status: AC
  Administered 2012-01-27: 4 mg via INTRAVENOUS

## 2012-01-27 MED ORDER — MORPHINE SULFATE 4 MG/ML IJ SOLN: 4.0000 mg | Freq: Once | INTRAMUSCULAR | Status: DC

## 2012-01-27 MED ORDER — ONDANSETRON HCL 4 MG/2ML IJ SOLN
4.0000 mg | Freq: Four times a day (QID) | INTRAMUSCULAR | Status: DC | PRN
  Administered 2012-01-28 – 2012-02-05 (×13): 4 mg via INTRAVENOUS
  Filled 2012-01-27 (×13): qty 2

## 2012-01-27 MED ORDER — SODIUM CHLORIDE 0.9 % IV SOLN
INTRAVENOUS | Status: DC
  Administered 2012-01-28: 6.6 [IU]/h via INTRAVENOUS
  Filled 2012-01-27: qty 1

## 2012-01-27 NOTE — Consult Note (Addendum)
Anesthesia:  Patient is a 56 year old female posted for repair of CSF leak/sphenoidectomy on 01/29/12.  Her diagnosis is listed as chronic sphenoid sinusitis. Patient reports that she is also suppose to have a lumbar drain placed by Dr. Consuello Masse , but I do not see any orders from his office yet.  (Her PAT RN was asked to follow-up with this.)  I do not currently have any of her ENT Dr. Theressa Millard notes.  Her history includes DM2 currently non-compliant and uncontrolled, obesity, CVA, HLD, gout, HTN, SOB, arthritis, anxiety, and smoking.  She goes to the [facility] and last saw Dr. Luetta Nutting.    Today she admitted to the PAT nurse that she stopped her DM meds (Glucotrol and Metformin).  Yesterday she felt terrible and checked her glucose and it was over 600.  Her sister treated her with her insulin and by 0100 her CBG was 178.  She did not eat anything today for fear of increasing her glucose further.  Labs today showed a fasting glucose of 384 and an elevated Cr of 2.07 (up from 1.33 in December).  EKG from 08/07/11 shows NSR.  She was evaluated by Dr. Harrington Challenger in August of 2012 for chest pain.  ACS was ruled out.  CXR on 11/07/11 showed stable CM, no active disease.  Stress test from 08/27/11 showed: Low risk stress nuclear study. There is no evidence of ischemia. His LV function is mildly depressed. EF 44%.  Echo from 08/08/11 (under Notes tab) showed:  Left ventricle: The cavity size was normal. Wall thickness was increased in a pattern of moderate LVH. Systolic function was normal. The estimated ejection fraction was in the range of 50% to 55%. Findings c/w grade 1 diastolic dysfunction, LA mildly dilated.  I spoke with Anesthesiologist Dr. Al Corpus, Dr. Simeon Craft, and Dr. Luetta Nutting today.  Dr. Simeon Craft feels the procedure should be done sooner than later; however, Anesthesia feels the patient's DM should be better controlled.  If procedure needs to be done, then Anesthesia is recommending admission  pre-operatively for uncontrolled DM and further evaluation of her newly elevated Cr.  Dr. Zigmund Daniel recommended sending [name] to the ED for evaluation.  If admission is desired then ED to contact the [place].  If not admitted, she is to follow-up with PCP tomorrow.   I'll follow-up tomorrow.      Addendum: 01/28/12 1100  [name] was admitted to [address] yesterday for significant hyperglycemia and acute on chronic kidney failure.  I reviewed this morning's labs.  Her glucose has been between 135-187 and her Cr is now 1.8.  [name] at Dr. Theressa Millard office and [name] at Dr. Mora Appl office were updated. I was told that Dr. Consuello Masse is wanting an Anesthesiologist to place the lumbar drain preoperatively tomorrow, but that he would otherwise assist with management.  He is going to speak with Dr. Simeon Craft about this later today.  I notified several of our Anesthesiologists, and one will be evaluating her later today.

## 2012-01-27 NOTE — Progress Notes (Signed)
Pt doesn't have a cardiologist;maintained by medical MD dr.matthews for htn and hyperlipidemia  Stress test in epic from 08/27/11 Echo in epic from 07/05/10 Never had a heart cath

## 2012-01-27 NOTE — Progress Notes (Signed)
Chart sent to [facility] for review d/t issue with noncompliance with diabetes meds and what sugar was last night;Per Dr.Crews orders labs stat,have pt stay here until results come back

## 2012-01-27 NOTE — Progress Notes (Addendum)
Pt states that she hasn't been taking her diabetes meds in the past couple of months,states that she has been drinking/eating regular foods,regular juice and soft drinks.Pt states she stays thirsty all the time;Checked sugar last night and it was over 600 and sister gave her her insulin Humulin N 50units and 50units of the Humalog and rechecked sugar 60mins later it was still over 600,about an hour later sugar was 469.States after the insulin whole body was cramping;states she thought she was ok not taking meds but now realizes its best to take meds

## 2012-01-27 NOTE — Progress Notes (Signed)
Called and left a message with Janett Billow at [place] office that we needed orders for his part in placing the drain;this was verified with Dr.Gore's nurse that he would be helping with this part of the surgery

## 2012-01-27 NOTE — Telephone Encounter (Signed)
Received call from [facility] in anesthesia.  States that [name] came in for pre-op anesthesia evaluation and told them that she had stopped taking her diabetes medications ~1 month ago because she felt like she did not need them anymore.  Began feeling bad a couple of days ago and checked glucose and it was >600.  She then began taking her sisters insulin(not prescribed insulin herself) and sugars came down 400's.  At pre-op eval sugars were in the mid 300's and cr up to 2.0 from baseline of 1.3.  Advised to send to ER for evaluation to see if they can get glucose down.  Talked to Dr. Francesco Sor with FM inpatient team to give her a heads up incase they decide she needs admission

## 2012-01-27 NOTE — ED Notes (Signed)
Pt. Was upstairs to do her pre-op and was sent to [place] due to her CBG over 500 , pt. Has a hx of diabetes.   Pt. Is having surgery Thursday for spinal fluid draining from her nares

## 2012-01-27 NOTE — ED Notes (Addendum)
Received from waiting room.pt here for elevated bs,over 600 yesterday and elevated today. Pt states frontal headache.pt states she is having surg on Thursday for a spinal fluid leak. Pt rates pain 8/10

## 2012-01-27 NOTE — ED Notes (Signed)
Patient placed on supplemental oxygen.  Saturations fell below 90.

## 2012-01-27 NOTE — ED Notes (Signed)
[id number] Ready

## 2012-01-27 NOTE — ED Notes (Signed)
Patient unable to void at this time.  States she will in thirty minutes.Marland Kitchen

## 2012-01-27 NOTE — Pre-Procedure Instructions (Signed)
[place]  01/27/2012   Your procedure is scheduled on:  Thurs, Feb 21 @ 1215 PM  Report to [place] at 1015 AM.  Call this number if you have problems the morning of surgery: [phone]   Remember:   Do not eat food:After Midnight.  May have clear liquids: up to 4 Hours before arrival.(until 6:15 am)  Clear liquids include soda, tea, black coffee, apple or grape juice, broth.  Take these medicines the morning of surgery with A SIP OF WATER: Amlodipine and Tramadol   Do not wear jewelry, make-up or nail polish.  Do not wear lotions, powders, or perfumes. You may wear deodorant.  Do not shave 48 hours prior to surgery.  Do not bring valuables to the hospital.  Contacts, dentures or bridgework may not be worn into surgery.  Leave suitcase in the car. After surgery it may be brought to your room.  For patients admitted to the hospital, checkout time is 11:00 AM the day of discharge.   Patients discharged the day of surgery will not be allowed to drive home.  Name and phone number of your driver:   Special Instructions: CHG Shower Use Special Wash: 1/2 bottle night before surgery and 1/2 bottle morning of surgery.   Please read over the following fact sheets that you were given: Pain Booklet, Coughing and Deep Breathing, MRSA Information and Surgical Site Infection Prevention

## 2012-01-27 NOTE — ED Notes (Signed)
CBG IS 569 mg/dl

## 2012-01-27 NOTE — H&P (Signed)
Chief Complaint: Hyperglycemia PCP: Zigmund Daniel  HPI: [name] is an 56 y.o. Female with history of Type 2 DM, HTN, HLD and CSF leakage who presents from anesthesia pre-operative evaluation with hyperglycemia. Patient stopped taking meds metformin and amaryl two months ago. Also her glucometer messed up and stopped checking CBGs. Did not tell her MD that she stopped meds but did cite some mild GI discomfort with metformin. Also has been drinking lots of "fruity drinks" and sodas lately due to excess thirst and polyuria. Her sister advised patient she may have hyperglycemia and gave her 50 units of Humalin N and 50 u of humalog yesterday. She also restarted her amaryl and metformin yesterday. In ED was dosed with 2 liters NS and CBGs remained elevated between 350-455. Asked to admit for tighter glucose control pre-operatively.   Notably, pt has a chronic HA and recent evaluation showed CSF leakage, planned for surgical correction on Thursday 2/21.   ROS:  No fevers, chills, SOB, rash, dysuria, edema, emesis, diarrhea, pain. Endorses polyuria, polydipsia, nausea, abdominal pain, some blurry vision. HA relieved by morphine.   Past Medical History  Diagnosis Date  . CVA (cerebral infarction) 03/29/2004  . HTN (hypertension)     takes Amlodipine daily  . Hyperlipidemia     takes Pravastatin daily  . Shortness of breath     with exertion  . Headache     related to CSF leakage  . Dizziness     related to CSF leakage  . Stroke 03/2004  . Arthritis     knuckles  . Hx of gout   . Diarrhea   . Urinary frequency   . Urinary urgency   . Diabetes mellitus   . Anxiety     doesn't take any meds for this    Past Surgical History  Procedure Date  . Nm myoview ltd 08/25/2011    Low risk study, no evidence of ischemia, EF 44%  . Total abdominal hysterectomy 1994    Family History  Problem Relation Age of Onset  . Colon cancer Sister 6  . Breast cancer Mother 66  . Diabetes Sister  31  . Diabetes Sister 85  . Lung cancer Father   . Anesthesia problems Neg Hx   . Hypotension Neg Hx   . Malignant hyperthermia Neg Hx   . Pseudochol deficiency Neg Hx    Social History:  reports that she has been smoking.  She does not have any smokeless tobacco history on file. She reports that she drinks alcohol. She reports that she does not use illicit drugs. Smokes 5 ciggarettes daily.   Allergies:  Allergies  Allergen Reactions  . Vicodin (Hydrocodone-Acetaminophen) Nausea And Vomiting    Medication Dose Route Frequency  . 0.9 %  sodium chloride infusion   Intravenous Continuous  . 0.9 %  sodium chloride infusion   Intravenous Once  . 0.9 %  sodium chloride infusion   Intravenous Continuous  . dextrose 5 %-0.45 % sodium chloride infusion   Intravenous Continuous  . dextrose 50 % solution 25 mL  25 mL Intravenous PRN  . insulin regular (NOVOLIN R,HUMULIN R) 1 Units/mL in sodium chloride 0.9 % 100 mL infusion   Intravenous Continuous  . morphine 4 MG/ML injection 4 mg  4 mg Intravenous Once  . nitroGLYCERIN (NITROSTAT) SL tablet 0.4 mg  0.4 mg Sublingual Once  . ondansetron (ZOFRAN) injection 4 mg  4 mg Intravenous Once  . sodium chloride 0.9 % bolus 1,000 mL  1,000 mL Intravenous Once  . DISCONTD: metFORMIN (GLUCOPHAGE) tablet 1,000 mg  1,000 mg Oral Once  . DISCONTD: morphine 4 MG/ML injection 4 mg  4 mg Intravenous Once  . DISCONTD: ondansetron (ZOFRAN) injection 4 mg  4 mg Intravenous Once   Medications Prior to Admission  Medication Sig Dispense Refill  . amLODipine (NORVASC) 10 MG tablet Take 1 tablet (10 mg total) by mouth daily.  90 tablet  0  . aspirin 325 MG tablet Take 325 mg by mouth daily.      Marland Kitchen glipiZIDE (GLUCOTROL) 5 MG tablet Take 1 tablet (5 mg total) by mouth 2 (two) times daily before a meal.  60 tablet  3  . hydrochlorothiazide (HYDRODIURIL) 25 MG tablet Take 1 tablet (25 mg total) by mouth daily.  90 tablet  6  . losartan (COZAAR) 25 MG tablet Take 1  tablet (25 mg total) by mouth daily.  30 tablet  3  . metFORMIN (GLUCOPHAGE) 1000 MG tablet Take 1 tablet (1,000 mg total) by mouth 2 (two) times daily with a meal.  30 tablet  3  . pravastatin (PRAVACHOL) 40 MG tablet Take 1 tablet (40 mg total) by mouth every evening.  30 tablet  6  . traMADol (ULTRAM) 50 MG tablet Take 50-100 mg by mouth every 6 (six) hours as needed. For pain.   Maximum dose= 8 tablets per day      . Lancets (ACCU-CHEK MULTICLIX) lancets Use to check blood sugar every morning  100 each  3    Results for orders placed during the hospital encounter of 01/27/12 (from the past 48 hour(s))  GLUCOSE, CAPILLARY     Status: Abnormal   Collection Time   01/27/12  4:33 PM      Component Value Range Comment   Glucose-Capillary 569 (*) 70 - 99 (mg/dL)    Comment 1 Notify RN     BASIC METABOLIC PANEL     Status: Abnormal   Collection Time   01/27/12  4:58 PM      Component Value Range Comment   Sodium 128 (*) 135 - 145 (mEq/L)    Potassium 4.2  3.5 - 5.1 (mEq/L)    Chloride 85 (*) 96 - 112 (mEq/L)    CO2 28  19 - 32 (mEq/L)    Glucose, Bld 495 (*) 70 - 99 (mg/dL)    BUN 31 (*) 6 - 23 (mg/dL)    Creatinine, Ser 2.22 (*) 0.50 - 1.10 (mg/dL)    Calcium 9.4  8.4 - 10.5 (mg/dL)    GFR calc non Af Amer 24 (*) >90 (mL/min)    GFR calc Af Amer 28 (*) >90 (mL/min)   CBC     Status: Normal   Collection Time   01/27/12  6:01 PM      Component Value Range Comment   WBC 10.5  4.0 - 10.5 (K/uL)    RBC 4.81  3.87 - 5.11 (MIL/uL)    Hemoglobin 15.0  12.0 - 15.0 (g/dL)    HCT 44.2  36.0 - 46.0 (%)    MCV 91.9  78.0 - 100.0 (fL)    MCH 31.2  26.0 - 34.0 (pg)    MCHC 33.9  30.0 - 36.0 (g/dL)    RDW 14.3  11.5 - 15.5 (%)    Platelets 186  150 - 400 (K/uL)   DIFFERENTIAL     Status: Abnormal   Collection Time   01/27/12  6:01 PM  Component Value Range Comment   Neutrophils Relative 75  43 - 77 (%)    Neutro Abs 7.8 (*) 1.7 - 7.7 (K/uL)    Lymphocytes Relative 18  12 - 46 (%)     Lymphs Abs 1.9  0.7 - 4.0 (K/uL)    Monocytes Relative 5  3 - 12 (%)    Monocytes Absolute 0.5  0.1 - 1.0 (K/uL)    Eosinophils Relative 3  0 - 5 (%)    Eosinophils Absolute 0.3  0.0 - 0.7 (K/uL)    Basophils Relative 0  0 - 1 (%)    Basophils Absolute 0.0  0.0 - 0.1 (K/uL)   URINALYSIS, ROUTINE W REFLEX MICROSCOPIC     Status: Abnormal   Collection Time   01/27/12  6:26 PM      Component Value Range Comment   Color, Urine YELLOW  YELLOW     APPearance CLEAR  CLEAR     Specific Gravity, Urine 1.025  1.005 - 1.030     pH 5.5  5.0 - 8.0     Glucose, UA >1000 (*) NEGATIVE (mg/dL)    Hgb urine dipstick TRACE (*) NEGATIVE     Bilirubin Urine NEGATIVE  NEGATIVE     Ketones, ur NEGATIVE  NEGATIVE (mg/dL)    Protein, ur NEGATIVE  NEGATIVE (mg/dL)    Urobilinogen, UA 0.2  0.0 - 1.0 (mg/dL)    Nitrite NEGATIVE  NEGATIVE     Leukocytes, UA NEGATIVE  NEGATIVE    URINE MICROSCOPIC-ADD ON     Status: Abnormal   Collection Time   01/27/12  6:26 PM      Component Value Range Comment   Squamous Epithelial / LPF MANY (*) RARE     WBC, UA 0-2  <3 (WBC/hpf)    Bacteria, UA MANY (*) RARE     Urine-Other RARE YEAST     GLUCOSE, CAPILLARY     Status: Abnormal   Collection Time   01/27/12  6:35 PM      Component Value Range Comment   Glucose-Capillary 456 (*) 70 - 99 (mg/dL)      ROS  Blood pressure 153/81, pulse 81, temperature 98.1 F (36.7 C), temperature source Oral, resp. rate 22, height 5\' 3"  (1.6 m), weight 249 lb (112.946 kg), SpO2 96.00%. Physical Exam  Constitutional: She is oriented to person, place, and time. No distress.  HENT:  Mouth/Throat: Oropharynx is clear and moist.  Eyes: Conjunctivae and EOM are normal. Pupils are equal, round, and reactive to light.  Neck: Neck supple. No JVD present.  Cardiovascular: Normal rate, regular rhythm, normal heart sounds and intact distal pulses.  Exam reveals no gallop and no friction rub.   No murmur heard. Respiratory: Effort normal and  breath sounds normal. No respiratory distress. She has no wheezes. She has no rales. She exhibits no tenderness.  GI: Soft. Bowel sounds are normal. There is no tenderness. There is no rebound and no guarding.  Musculoskeletal: Normal range of motion. She exhibits no edema.       Normal pedal pulses.  Lymphadenopathy:    She has no cervical adenopathy.  Neurological: She is alert and oriented to person, place, and time. She has normal reflexes.       No neurological focalization. No active (CHF) rhinorrhea.   Skin: Skin is warm and dry. No rash noted. No erythema.     Assessment/Plan  Ms. Coote is a 56 y/o F with hyperglycemia and acute renal failure  admitted for control prior surgery. (Neuro -ENT for CSF leakage through the nose)  1: DM T2: Previously borderline controlled with A1c 8.4 on Metformin and Amaryl but stopped treatment 2 months ago. Blood Glucose elevated to 600. Likely with some hyperosmotic state but no acidosis or ketones currently. Will admit to regular floor on glucomander and f/u for baseline needs to hopefully transition to bolus insulin in long term. Stop metformin with elevated cr. F/u BMET to assess K this pm. Will continue IV hydration.  2. Acute on chronic kidney failure:  Creatinine baseline on 1.33. Today 2.22, likely due to osmotic diuresis and dehydration. Will f/u cr in am. Hold metformin and ARB.  3. Hyponatremia. Mild when corrected for hyperglycemia. Likely secondary to dehydration. Continue NS IV hydration and f/u BMET this pm.   4. Hypertension: BP elevated mildly.  -home HCTZ and norvasc. Hold ARB for now.   5. Hyperlipidemia - on Pravastatin. Will continue.   6. CSF leakage through nose: Hopefully can proceed with surgical correction pending glucose control. Will hold ASA as instructed for pre-operative preparation.  7. Dispo. Full code. Heparin SQ.   [place] PGY-2 01/27/2012, 8:22 PM

## 2012-01-27 NOTE — ED Provider Notes (Addendum)
History     CSN: [id number]  Arrival date & time 01/27/12  1539   First MD Initiated Contact with Patient 01/27/12 1640      Chief Complaint  Patient presents with  . Hyperglycemia    (Consider location/radiation/quality/duration/timing/severity/associated sxs/prior treatment) HPI [name] is a 56 y.o. female presents with c/o hyperglycemia leading to desire to be assessed in the ED. The sx(s) have been present for unknown period of time. Additional concerns are polyuria. Causative factors are stopping metformin. Palliative factors are nothing has been tried. The distress associated is mild. The disorder has been present for unknown time.      Past Medical History  Diagnosis Date  . CVA (cerebral infarction) 03/29/2004  . HTN (hypertension)     takes Amlodipine daily  . Hyperlipidemia     takes Pravastatin daily  . Shortness of breath     with exertion  . Headache     related to CSF leakage  . Dizziness     related to CSF leakage  . Stroke 03/2004  . Arthritis     knuckles  . Hx of gout   . Diarrhea   . Urinary frequency   . Urinary urgency   . Diabetes mellitus   . Anxiety     doesn't take any meds for this    Past Surgical History  Procedure Date  . Nm myoview ltd 08/25/2011    Low risk study, no evidence of ischemia, EF 44%  . Total abdominal hysterectomy 1994    Family History  Problem Relation Age of Onset  . Colon cancer Sister 70  . Breast cancer Mother 43  . Diabetes Sister 22  . Diabetes Sister 49  . Lung cancer Father   . Anesthesia problems Neg Hx   . Hypotension Neg Hx   . Malignant hyperthermia Neg Hx   . Pseudochol deficiency Neg Hx     History  Substance Use Topics  . Smoking status: Current Everyday Smoker -- 0.5 packs/day for 35 years  . Smokeless tobacco: Not on file  . Alcohol Use: Yes     Occasional beer    OB History    Grav Para Term Preterm Abortions TAB SAB Ect Mult Living                  Review of  Systems  All other systems reviewed and are negative.    Allergies  Vicodin  Home Medications   No current outpatient prescriptions on file.  BP 132/64  Pulse 75  Temp(Src) 98 F (36.7 C) (Oral)  Resp 20  Ht 5\' 3"  (1.6 m)  Wt 249 lb (112.946 kg)  BMI 44.11 kg/m2  SpO2 98%  Physical Exam  Nursing note and vitals reviewed. Constitutional: She is oriented to person, place, and time. She appears well-developed and well-nourished.  HENT:  Head: Normocephalic and atraumatic.       No apparent nasal d/c.  Eyes: Conjunctivae and EOM are normal. Pupils are equal, round, and reactive to light.  Neck: Normal range of motion and phonation normal. Neck supple.  Cardiovascular: Normal rate, regular rhythm and intact distal pulses.   Pulmonary/Chest: Effort normal and breath sounds normal. She exhibits no tenderness.  Abdominal: Soft. She exhibits no distension. There is no tenderness. There is no guarding.  Musculoskeletal: Normal range of motion.  Neurological: She is alert and oriented to person, place, and time. She has normal strength. She exhibits normal muscle tone.  Skin: Skin is  warm and dry.  Psychiatric: She has a normal mood and affect. Her behavior is normal. Judgment and thought content normal.    ED Course  Procedures (including critical care time) As the current treatment, IV, saline 2 L bolus with maintenance drip. Following. IV insulin per glucose stabilizer. Is ordered and given. Her CBGs improved. Patient remained nontoxic.    Labs Reviewed  GLUCOSE, CAPILLARY - Abnormal; Notable for the following:    Glucose-Capillary 569 (*)    All other components within normal limits  BASIC METABOLIC PANEL - Abnormal; Notable for the following:    Sodium 128 (*)    Chloride 85 (*)    Glucose, Bld 495 (*)    BUN 31 (*)    Creatinine, Ser 2.22 (*)    GFR calc non Af Amer 24 (*)    GFR calc Af Amer 28 (*)    All other components within normal limits  URINALYSIS, ROUTINE  W REFLEX MICROSCOPIC - Abnormal; Notable for the following:    Glucose, UA >1000 (*)    Hgb urine dipstick TRACE (*)    All other components within normal limits  DIFFERENTIAL - Abnormal; Notable for the following:    Neutro Abs 7.8 (*)    All other components within normal limits  GLUCOSE, CAPILLARY - Abnormal; Notable for the following:    Glucose-Capillary 456 (*)    All other components within normal limits  URINE MICROSCOPIC-ADD ON - Abnormal; Notable for the following:    Squamous Epithelial / LPF MANY (*)    Bacteria, UA MANY (*)    All other components within normal limits  GLUCOSE, CAPILLARY - Abnormal; Notable for the following:    Glucose-Capillary 462 (*)    All other components within normal limits  BASIC METABOLIC PANEL - Abnormal; Notable for the following:    Sodium 133 (*)    Chloride 92 (*)    Glucose, Bld 320 (*)    BUN 28 (*)    Creatinine, Ser 2.04 (*)    GFR calc non Af Amer 26 (*)    GFR calc Af Amer 31 (*)    All other components within normal limits  GLUCOSE, CAPILLARY - Abnormal; Notable for the following:    Glucose-Capillary 318 (*)    All other components within normal limits  BASIC METABOLIC PANEL - Abnormal; Notable for the following:    Glucose, Bld 157 (*)    BUN 24 (*)    Creatinine, Ser 1.80 (*)    GFR calc non Af Amer 31 (*)    GFR calc Af Amer 36 (*)    All other components within normal limits  GLUCOSE, CAPILLARY - Abnormal; Notable for the following:    Glucose-Capillary 281 (*)    All other components within normal limits  GLUCOSE, CAPILLARY - Abnormal; Notable for the following:    Glucose-Capillary 250 (*)    All other components within normal limits  GLUCOSE, CAPILLARY - Abnormal; Notable for the following:    Glucose-Capillary 193 (*)    All other components within normal limits  GLUCOSE, CAPILLARY - Abnormal; Notable for the following:    Glucose-Capillary 168 (*)    All other components within normal limits  GLUCOSE,  CAPILLARY - Abnormal; Notable for the following:    Glucose-Capillary 177 (*)    All other components within normal limits  GLUCOSE, CAPILLARY - Abnormal; Notable for the following:    Glucose-Capillary 135 (*)    All other components within normal limits  GLUCOSE, CAPILLARY - Abnormal; Notable for the following:    Glucose-Capillary 154 (*)    All other components within normal limits  GLUCOSE, CAPILLARY - Abnormal; Notable for the following:    Glucose-Capillary 187 (*)    All other components within normal limits  GLUCOSE, CAPILLARY - Abnormal; Notable for the following:    Glucose-Capillary 343 (*)    All other components within normal limits  GLUCOSE, CAPILLARY - Abnormal; Notable for the following:    Glucose-Capillary 226 (*)    All other components within normal limits  GLUCOSE, CAPILLARY - Abnormal; Notable for the following:    Glucose-Capillary 243 (*)    All other components within normal limits  CBC  MAGNESIUM  PHOSPHORUS  CBC  CBC  DIFFERENTIAL  URINE CULTURE  SURGICAL PCR SCREEN   No results found.   1. Hyperglycemia   2. Renal insufficiency   3. Dehydration   4. Hypertension   5. Nasal discharge   6. Hyperlipidemia    CRITICAL CARE Performed by: Richarda Blade   Total critical care time: 35  Critical care time was exclusive of separately billable procedures and treating other patients.  Critical care was necessary to treat or prevent imminent or life-threatening deterioration.  Critical care was time spent personally by me on the following activities: development of treatment plan with patient and/or surrogate as well as nursing, discussions with consultants, evaluation of patient's response to treatment, examination of patient, obtaining history from patient or surrogate, ordering and performing treatments and interventions, ordering and review of laboratory studies, ordering and review of radiographic studies, pulse oximetry and re-evaluation of  patient's condition.  MDM  Hyperglycemia due to medication noncompliance. Patient is not ketotic. She's been treated with IV fluids and IV insulin emergency department. She will need to be admitted for monitoring and being restarted on her oral hypoglycemic agents. The patient has upcoming cranial surgery to fix a spinal fluid leak. There is no sign of infection today. Possible UTI. Culture has been ordered.        Richarda Blade, MD 01/28/12 [place], MD 02/12/12 [phone]

## 2012-01-28 ENCOUNTER — Encounter (HOSPITAL_COMMUNITY): Payer: Self-pay | Admitting: *Deleted

## 2012-01-28 LAB — GLUCOSE, CAPILLARY
Glucose-Capillary: 250 mg/dL — ABNORMAL HIGH (ref 70–99)
Glucose-Capillary: 281 mg/dL — ABNORMAL HIGH (ref 70–99)
Glucose-Capillary: 193 mg/dL — ABNORMAL HIGH (ref 70–99)
Glucose-Capillary: 168 mg/dL — ABNORMAL HIGH (ref 70–99)
Glucose-Capillary: 135 mg/dL — ABNORMAL HIGH (ref 70–99)
Glucose-Capillary: 177 mg/dL — ABNORMAL HIGH (ref 70–99)
Glucose-Capillary: 154 mg/dL — ABNORMAL HIGH (ref 70–99)
Glucose-Capillary: 187 mg/dL — ABNORMAL HIGH (ref 70–99)
Glucose-Capillary: 343 mg/dL — ABNORMAL HIGH (ref 70–99)
Glucose-Capillary: 226 mg/dL — ABNORMAL HIGH (ref 70–99)
Glucose-Capillary: 243 mg/dL — ABNORMAL HIGH (ref 70–99)
Glucose-Capillary: 284 mg/dL — ABNORMAL HIGH (ref 70–99)
Glucose-Capillary: 316 mg/dL — ABNORMAL HIGH (ref 70–99)

## 2012-01-28 LAB — BASIC METABOLIC PANEL
BUN: 28 mg/dL — ABNORMAL HIGH (ref 6–23)
BUN: 24 mg/dL — ABNORMAL HIGH (ref 6–23)
CO2: 31 mEq/L (ref 19–32)
CO2: 29 mEq/L (ref 19–32)
Calcium: 8.5 mg/dL (ref 8.4–10.5)
Calcium: 8.6 mg/dL (ref 8.4–10.5)
Chloride: 92 mEq/L — ABNORMAL LOW (ref 96–112)
Chloride: 98 mEq/L (ref 96–112)
Creatinine, Ser: 2.04 mg/dL — ABNORMAL HIGH (ref 0.50–1.10)
Creatinine, Ser: 1.8 mg/dL — ABNORMAL HIGH (ref 0.50–1.10)
GFR calc Af Amer: 31 mL/min — ABNORMAL LOW (ref >90)
GFR calc Af Amer: 36 mL/min — ABNORMAL LOW (ref >90)
GFR calc non Af Amer: 26 mL/min — ABNORMAL LOW (ref >90)
GFR calc non Af Amer: 31 mL/min — ABNORMAL LOW (ref >90)
Glucose, Bld: 320 mg/dL — ABNORMAL HIGH (ref 70–99)
Glucose, Bld: 157 mg/dL — ABNORMAL HIGH (ref 70–99)
Potassium: 3.8 mEq/L (ref 3.5–5.1)
Potassium: 3.6 mEq/L (ref 3.5–5.1)
Sodium: 133 mEq/L — ABNORMAL LOW (ref 135–145)
Sodium: 139 mEq/L (ref 135–145)

## 2012-01-28 LAB — MAGNESIUM: Magnesium: 2 mg/dL (ref 1.5–2.5)

## 2012-01-28 LAB — CBC
HCT: 44.3 % (ref 36.0–46.0)
Hemoglobin: 14.7 g/dL (ref 12.0–15.0)
MCH: 30.7 pg (ref 26.0–34.0)
MCHC: 33.2 g/dL (ref 30.0–36.0)
MCV: 92.5 fL (ref 78.0–100.0)
Platelets: 179 10*3/uL (ref 150–400)
RBC: 4.79 MIL/uL (ref 3.87–5.11)
RDW: 14.2 % (ref 11.5–15.5)
WBC: 8.3 10*3/uL (ref 4.0–10.5)

## 2012-01-28 LAB — PHOSPHORUS: Phosphorus: 3.8 mg/dL (ref 2.3–4.6)

## 2012-01-28 LAB — SURGICAL PCR SCREEN
MRSA, PCR: NEGATIVE
Staphylococcus aureus: NEGATIVE

## 2012-01-28 MED ORDER — INSULIN PEN STARTER KIT
1.0000 | Freq: Once | Status: AC
  Administered 2012-01-28: 1
  Filled 2012-01-28: qty 1

## 2012-01-28 MED ORDER — INSULIN ASPART 100 UNIT/ML ~~LOC~~ SOLN
0.0000 [IU] | Freq: Three times a day (TID) | SUBCUTANEOUS | Status: DC
  Administered 2012-01-28: 11 [IU] via SUBCUTANEOUS
  Administered 2012-01-28 – 2012-01-29 (×2): 7 [IU] via SUBCUTANEOUS
  Administered 2012-01-29: 15 [IU] via SUBCUTANEOUS
  Administered 2012-01-30 (×3): 7 [IU] via SUBCUTANEOUS
  Administered 2012-01-31 (×2): 4 [IU] via SUBCUTANEOUS
  Administered 2012-01-31: 7 [IU] via SUBCUTANEOUS
  Administered 2012-02-01: 3 [IU] via SUBCUTANEOUS
  Administered 2012-02-01 (×2): 4 [IU] via SUBCUTANEOUS
  Administered 2012-02-02: 3 [IU] via SUBCUTANEOUS
  Administered 2012-02-02 (×2): 4 [IU] via SUBCUTANEOUS
  Administered 2012-02-03: 3 [IU] via SUBCUTANEOUS
  Administered 2012-02-03: 11 [IU] via SUBCUTANEOUS
  Administered 2012-02-03: 6 [IU] via SUBCUTANEOUS
  Administered 2012-02-04: 4 [IU] via SUBCUTANEOUS
  Administered 2012-02-04: 3 [IU] via SUBCUTANEOUS
  Administered 2012-02-05 (×2): 4 [IU] via SUBCUTANEOUS
  Administered 2012-02-05: 3 [IU] via SUBCUTANEOUS
  Administered 2012-02-06: 4 [IU] via SUBCUTANEOUS
  Administered 2012-02-06: 3 [IU] via SUBCUTANEOUS
  Filled 2012-01-28 (×2): qty 3

## 2012-01-28 MED ORDER — INSULIN GLARGINE 100 UNIT/ML ~~LOC~~ SOLN
30.0000 [IU] | Freq: Every day | SUBCUTANEOUS | Status: DC
  Administered 2012-01-28 – 2012-01-29 (×2): 30 [IU] via SUBCUTANEOUS
  Filled 2012-01-28 (×2): qty 3

## 2012-01-28 MED ORDER — INSULIN ASPART 100 UNIT/ML ~~LOC~~ SOLN
0.0000 [IU] | Freq: Every day | SUBCUTANEOUS | Status: DC
  Administered 2012-01-28: 4 [IU] via SUBCUTANEOUS
  Administered 2012-01-29 – 2012-01-30 (×2): 2 [IU] via SUBCUTANEOUS

## 2012-01-28 MED ORDER — CEFAZOLIN SODIUM 1-5 GM-% IV SOLN
1.0000 g | Freq: Once | INTRAVENOUS | Status: AC
  Administered 2012-01-29 (×2): 1 g via INTRAVENOUS
  Filled 2012-01-28 (×2): qty 50

## 2012-01-28 MED ORDER — INSULIN ASPART 100 UNIT/ML ~~LOC~~ SOLN
6.0000 [IU] | Freq: Three times a day (TID) | SUBCUTANEOUS | Status: DC
  Administered 2012-01-28 – 2012-02-04 (×7): 6 [IU] via SUBCUTANEOUS

## 2012-01-28 NOTE — Progress Notes (Signed)
Subjective: Doing well and only complains of some mild nausea, but no vomiting. Says her headache is resolved. Also felt that she had a fast heart rate or palpitations on admission and is now normal. Aware of plan for surgery for her CSF leak tomorrow.  Objective: Vital signs in last 24 hours: Temp:  [97.6 F (36.4 C)-98.2 F (36.8 C)] 98 F (36.7 C) (02/20 1000) Pulse Rate:  [67-96] 75  (02/20 1000) Resp:  [16-24] 20  (02/20 1000) BP: (124-163)/(61-93) 132/64 mmHg (02/20 1000) SpO2:  [90 %-98 %] 98 % (02/20 1000) Weight:  [112.946 kg (249 lb)] 112.946 kg (249 lb) (02/19 1638) Weight change:  Last BM Date: 01/26/12  Intake/Output from previous day:   Intake/Output this shift: Total I/O In: 1131.2 [P.O.:480; I.V.:651.2] Out: -   Physical Exam: General: Seated comfortably in bed. No distress. Obese. HEENT: Atraumatic. Normocephalic. EOMI. No rhinorrhea.  CV: RRR. No murmurs, rubs or gallops.  Respiratory: Normal WOB. CTAB.  GI: Soft. NT. ND. No masses. No rebound or guarding. BS + Extremities: No cyanosis, clubbing or edema.  Skin: Warm. No rash. Neuro: AOx3. CNII-XII grossly intact.  Lab Results:  Basename 01/28/12 0620 01/27/12 1801  WBC 8.3 10.5  HGB 14.7 15.0  HCT 44.3 44.2  PLT 179 186   BMET  Basename 01/28/12 0620 01/27/12 2304  NA 139 133*  K 3.6 3.8  CL 98 92*  CO2 29 31  GLUCOSE 157* 320*  BUN 24* 28*  CREATININE 1.80* 2.04*  CALCIUM 8.6 8.5    Studies/Results: No results found.  Medications: I have reviewed the patient's current medications.  Assessment/Plan: 56 yo F with hyperosmolar hyperglycemic state and acute renal failure admitted for control prior to planned surgery on 2/21 by ENT for CSF rhinorrhea.   1: DM T2: Previously borderline controlled with A1c 8.4 on Metformin and Amaryl but stopped treatment 2 months ago. - CBG 600 on admission. Placed on glucomander and hydrated. Glucose now normalized.  - Stopped metformin with elevated  Cr. - Placed on Lantus 30 daily and SSI.  2. Acute on chronic kidney failure: Creatinine baseline of 1.33. 2.22 on admission, likely due to osmotic diuresis and dehydration. - Hydrated and Cr now 1.80. - Will recheck BMP tomorrow and continue to hydrate and encourage PO fluids  3. Hyponatremia. Mild when corrected for hyperglycemia. Likely secondary to dehydration. - Normalized after hydration  4. Hypertension: BP elevated mildly.  - Home HCTZ and norvasc. Hold ARB for now.   5. Hyperlipidemia  - On Pravastatin. Will continue.   6. CSF Rhinorrhea - Plan for surgery tomorrow at noon - Will hold ASA as instructed for pre-operative preparation.   6. FEN/GI: Diabetic Diet. NPO after midnight for surgery. 7. PPx: Heparin 8. Dispo. Full code. Heparin SQ.      LOS: 1 day   Jonetta Osgood, [facility] Teaching Service 01/28/2012, 1:49 PM  Lyndee Hensen 2:32 PM 01/28/2012  Patient seen and examined independently. Above note reviewed and edited as needed. Below is my independent PE, please refer to the edited A/P above.  PE: Filed Vitals:   01/27/12 2045 01/27/12 2100 01/28/12 0420 01/28/12 1000  BP:  157/68 139/61 132/64  Pulse: 71 67 70 75  Temp:   97.6 F (36.4 C) 98 F (36.7 C)  TempSrc:   Oral Oral  Resp:   18 20  Height:      Weight:      SpO2: 94% 95% 91% 98%  General: Seated comfortably in  bed. No distress. Obese. HEENT: Atraumatic. Normocephalic. EOMI. No rhinorrhea.  CV: RRR. No murmurs, rubs or gallops.  Respiratory: Normal WOB. CTAB.  GI: Soft. NT. ND. No masses. No rebound or guarding. BS + Extremities: No cyanosis, clubbing or edema.  Skin: Warm. No rash. Neuro: AOx3. CNII-XII grossly intact.  Lyndee Hensen MD

## 2012-01-28 NOTE — H&P (Signed)
01/29/2012  [name]  PREOPERATIVE HISTORY AND PHYSICAL  CHIEF COMPLAINT: left Sphenoid Sternberg's Canal Encephalocele with CSF leak  HISTORY: This is a 56 year old who presents with  left Sphenoid Sternberg's Canal Encephalocele with CSF leak.  She was admitted after her preoperative visit Tuesday when it was noted that after stopping her oral hypoglycemic medications for her diabetes that her blood sugars and creatinine were significantly elevated. The family medicine team have treated her an significantly improved her blood sugars and creatinine. She is scheduled for  for endoscopic endonasal transsphenoid transpterygoid repair of the left Sphenoid/Sternberg's canal encephalocele with post-op lumbar drainage..  Dr. Simeon Craft, Alroy Dust has discussed the risks, benefits, and alternatives of this procedure, particularly recurrence of the CSF leak, bleeding, infection, meningitis, orbital or skull base injury, pneumocephalus. The patient understands the risks and would like to proceed with the procedure. The chances of success of the procedure are 50-75% and the patient understands this. I personally performed an examination of the patient within 24 hours of the procedure.  PAST MEDICAL HISTORY: Past Medical History  Diagnosis Date  . CVA (cerebral infarction) 03/29/2004  . HTN (hypertension)     takes Amlodipine daily  . Hyperlipidemia     takes Pravastatin daily  . Shortness of breath     with exertion  . Headache     related to CSF leakage  . Dizziness     related to CSF leakage  . Stroke 03/2004  . Arthritis     knuckles  . Hx of gout   . Diarrhea   . Urinary frequency   . Urinary urgency   . Diabetes mellitus   . Anxiety     doesn't take any meds for this    PAST SURGICAL HISTORY: Past Surgical History  Procedure Date  . Nm myoview ltd 08/25/2011    Low risk study, no evidence of ischemia, EF 44%  . Total abdominal hysterectomy 1994    MEDICATIONS:  Scheduled Meds:    . sodium chloride   Intravenous Once  . amLODipine  10 mg Oral Daily  . Flexpen Starter Kit  1 kit Other Once  . hydrochlorothiazide  25 mg Oral Daily  . insulin aspart  0-20 Units Subcutaneous TID WC  . insulin aspart  0-5 Units Subcutaneous QHS  . insulin aspart  6 Units Subcutaneous TID WC  . insulin glargine  30 Units Subcutaneous QHS  .  morphine injection  4 mg Intravenous Once  . ondansetron (ZOFRAN) IV  4 mg Intravenous Once  . simvastatin  20 mg Oral q1800  . sodium chloride  1,000 mL Intravenous Once  . DISCONTD: heparin  5,000 Units Subcutaneous Q8H  . DISCONTD: insulin regular  0-10 Units Intravenous TID WC  . DISCONTD: losartan  25 mg Oral Daily  . DISCONTD: metFORMIN  1,000 mg Oral Once  . DISCONTD:  morphine injection  4 mg Intravenous Once  . DISCONTD: ondansetron (ZOFRAN) IV  4 mg Intravenous Once   Continuous Infusions:   . sodium chloride Stopped (01/28/12 0209)  . DISCONTD: sodium chloride    . DISCONTD: sodium chloride 150 mL/hr at 01/27/12 2047  . DISCONTD: dextrose 5 % and 0.45% NaCl    . DISCONTD: dextrose 5 % and 0.45% NaCl 75 mL/hr at 01/28/12 0209  . DISCONTD: insulin (NOVOLIN-R) infusion 4 Units/hr (01/27/12 2046)  . DISCONTD: insulin (NOVOLIN-R) infusion 3.8 Units/hr (01/28/12 [id number])   PRN Meds:.acetaminophen, acetaminophen, dextrose, ondansetron (ZOFRAN) IV, ondansetron, traMADol, DISCONTD: dextrose  ALLERGIES: Allergies  Allergen Reactions  . Vicodin (Hydrocodone-Acetaminophen) Nausea And Vomiting    SOCIAL HISTORY: History   Social History  . Marital Status: Single    Spouse Name: N/A    Number of Children: N/A  . Years of Education: N/A   Occupational History  . Not on file.   Social History Main Topics  . Smoking status: Current Everyday Smoker -- 0.5 packs/day for 35 years  . Smokeless tobacco: Not on file  . Alcohol Use: Yes     Occasional beer  . Drug Use: No  . Sexually Active: No   Other Topics Concern  . Not on file    Social History Narrative   Currently unemployed.  Really wants to find a job, but has been difficult for her.  No children, not married    FAMILY HISTORY: Family History  Problem Relation Age of Onset  . Colon cancer Sister 66  . Breast cancer Mother 4  . Diabetes Sister 24  . Diabetes Sister 15  . Lung cancer Father   . Anesthesia problems Neg Hx   . Hypotension Neg Hx   . Malignant hyperthermia Neg Hx   . Pseudochol deficiency Neg Hx     REVIEW OF SYSTEMS: left CSF rhinorrhea, otherwise negative x 12 systems except per HPI.   PHYSICAL EXAM:  GENERAL:  Awake, alert VITAL SIGNS:   Filed Vitals:   01/28/12 1000  BP: 132/64  Pulse: 75  Temp: 98 F (36.7 C)  Resp: 20   SKIN:  Warm, dry HEENT:  Intermittent left CSF rhinorrhea, otherwise atraumatic, normocephalic NECK:  supple LYMPH:  No LAD LUNGS:  clear CARDIOVASCULAR:  RRR ABDOMEN:  soft MUSCULOSKELETAL: normal strength PSYCH:  Awake, alert NEUROLOGIC:  CN 2-12 intact and symmetric  DIAGNOSTIC STUDIES:CT and MRI reviewed, this shows a left Sternberg's canal/lateral sphenoid encephalocele with CSF density on T2 inferior to the bony dehiscence lateral to V2 or the CT scan.  ASSESSMENT AND PLAN: Plan to proceed with endoscopic repair left lateral sphenoid/Sternberg's canal CSF leak/encephalocele. Patient understands the risks, benefits, and alternatives. NPO after midnight for OR tomorrow. 01/28/12 3:08 PM [name]

## 2012-01-28 NOTE — Progress Notes (Signed)
Pt given Diabetic starter kit and given several diabetes hand outs and educated on diabetes and insulin management. Pt expressed understanding and said that she would read the information.

## 2012-01-28 NOTE — Progress Notes (Signed)
Pt transferred from the ED around 2200 on 01/27/12, admitted to [id number]. Pt comes from home with family. She is alert and oriented. Ambulatory with ad lib in room, steady gait. No skin breakdown noted. Pt currently on Glucose Stabilizer, educated on how the program works and monitoring input. Oriented to room, instructed to call for assistance, call bell in reach. Resting comfortably at this time, will continue to monitor.  [name]

## 2012-01-28 NOTE — H&P (Signed)
Seen and examined.  Chart reviewed.  Discussed with Dr. Verlee Rossetti.  Agree with her documentation and management.  Briefly, 56 yo female admitted with significant hyperglycemia.  Known Type 2 DM and admits to non compliance.  Also complicating the issue is that she is scheduled for repair of CSF leak tomorrow.  Issues: 1. BS coming under control nicely.  Switch to lantus and SSI to cover throughout perioperative period.  I would not restart orals. 2.  I do not see a BMP this morning.  Had some acute renal failure on top of CKD stage 2 or 3.  Also, need to follow K+ closely in that she has several risk factors for hypokalemia. 3.  With her nausea, I would start empiric PPI and continue through perioperative period.   4. Notify surgeon and anesthesia of her admission to coordinate.  She should be fine for her surgery tomorrow. 5.  We can advance her diet but will need to be made NPO tonight.

## 2012-01-28 NOTE — Progress Notes (Addendum)
Patient admitted with hyperglycemia.  Scheduled for surgery tomorrow.  Transitioned off IV insulin drip this morning.  To start Lantus 30 units QHS tonight.  Also currently on Novolog Resistant SSI and 6 units meal coverage.  Per H&P, pt has not been taking her home oral diabetes medications for the past 2 months.  Pt stated she thought she didn't need any at this point.  Last A1C was 8.4% (11/07/11).  Discussed patient's A1C results from November with her. Reminded pt that her goal A1C (per ADA standards) is 7% or less.  Patient told me the MD said she may go home on insulin.  Explained how long-acting insulin works and also explained how pt's current home oral diabetes medications work.  Pt told me she would be ok taking insulin at home and would prefer an insulin pen.  Have ordered a Flexpen starter kit.  RNs to instruct pt on insulin administration.  Pt told me she does not have a CBG meter.  Advised pt to check to see if she can get a CBG meter [facility].  If not, advised pt to purchase a CBG meter OTC at [facility].  Meter is $16 and 50 count strips is $9.  Encouraged pt to check her CBGs at least bid at home and to record.  Reminded pt that her primary MD needs CBG data to make decisions about her DM care.  Pt stated she agreed.  Will follow and assist as needed. Wyn Quaker RN, MSN, CDE Diabetes Coordinator [facility]patient Diabetes [facility] [phone]

## 2012-01-29 ENCOUNTER — Inpatient Hospital Stay (HOSPITAL_COMMUNITY): Payer: Self-pay

## 2012-01-29 ENCOUNTER — Encounter (HOSPITAL_COMMUNITY): Payer: Self-pay | Admitting: Anesthesiology

## 2012-01-29 ENCOUNTER — Inpatient Hospital Stay (HOSPITAL_COMMUNITY): Payer: Self-pay | Admitting: Vascular Surgery

## 2012-01-29 ENCOUNTER — Encounter (HOSPITAL_COMMUNITY)
Admission: EM | Disposition: A | Discharge status: Home / Self Care | Payer: Self-pay | Source: Ambulatory Visit | Attending: Family Medicine

## 2012-01-29 ENCOUNTER — Encounter (HOSPITAL_COMMUNITY): Payer: Self-pay | Admitting: Vascular Surgery

## 2012-01-29 ENCOUNTER — Ambulatory Visit (HOSPITAL_COMMUNITY): Admission: RE | Admit: 2012-01-29 | Payer: Self-pay | Source: Ambulatory Visit | Admitting: Otolaryngology

## 2012-01-29 DIAGNOSIS — G4730 Sleep apnea, unspecified: Secondary | ICD-10-CM

## 2012-01-29 DIAGNOSIS — G4710 Hypersomnia, unspecified: Secondary | ICD-10-CM

## 2012-01-29 DIAGNOSIS — J9600 Acute respiratory failure, unspecified whether with hypoxia or hypercapnia: Secondary | ICD-10-CM

## 2012-01-29 HISTORY — PX: SPHENOIDECTOMY: SHX2421

## 2012-01-29 HISTORY — PX: NASAL SINUS SURGERY: SHX719

## 2012-01-29 LAB — URINE CULTURE
Colony Count: 80000
Culture  Setup Time: 201302191936

## 2012-01-29 LAB — GLUCOSE, CAPILLARY
Glucose-Capillary: 332 mg/dL — ABNORMAL HIGH (ref 70–99)
Glucose-Capillary: 243 mg/dL — ABNORMAL HIGH (ref 70–99)
Glucose-Capillary: 242 mg/dL — ABNORMAL HIGH (ref 70–99)

## 2012-01-29 LAB — POCT I-STAT GLUCOSE
Glucose, Bld: 212 mg/dL — ABNORMAL HIGH (ref 70–99)
Operator id: 177011

## 2012-01-29 SURGERY — SPHENOIDECTOMY
Anesthesia: General | Site: Nose | Wound class: Clean Contaminated

## 2012-01-29 MED ORDER — HEPARIN SODIUM (PORCINE) 5000 UNIT/ML IJ SOLN
5000.0000 [IU] | Freq: Three times a day (TID) | INTRAMUSCULAR | Status: DC
  Administered 2012-01-29 – 2012-02-06 (×24): 5000 [IU] via SUBCUTANEOUS
  Filled 2012-01-29 (×26): qty 1

## 2012-01-29 MED ORDER — OXYMETAZOLINE HCL 0.05 % NA SOLN
NASAL | Status: DC | PRN
  Administered 2012-01-29: 2 via NASAL

## 2012-01-29 MED ORDER — ONDANSETRON HCL 4 MG/2ML IJ SOLN
INTRAMUSCULAR | Status: DC | PRN
  Administered 2012-01-29: 4 mg via INTRAVENOUS

## 2012-01-29 MED ORDER — CEFAZOLIN SODIUM 1-5 GM-% IV SOLN
INTRAVENOUS | Status: AC
  Filled 2012-01-29: qty 50

## 2012-01-29 MED ORDER — HYDRALAZINE HCL 20 MG/ML IJ SOLN
10.0000 mg | INTRAMUSCULAR | Status: DC | PRN
  Administered 2012-01-29: 10 mg via INTRAVENOUS
  Filled 2012-01-29: qty 0.5

## 2012-01-29 MED ORDER — FENTANYL CITRATE 0.05 MG/ML IJ SOLN
25.0000 ug | INTRAMUSCULAR | Status: DC | PRN
  Administered 2012-01-29 – 2012-01-30 (×2): 25 ug via INTRAVENOUS
  Administered 2012-01-31 (×2): 50 ug via INTRAVENOUS
  Administered 2012-01-31: 25 ug via INTRAVENOUS
  Administered 2012-01-31: 50 ug via INTRAVENOUS
  Filled 2012-01-29 (×6): qty 2

## 2012-01-29 MED ORDER — VECURONIUM BROMIDE 10 MG IV SOLR
INTRAVENOUS | Status: DC | PRN
  Administered 2012-01-29: 4 mg via INTRAVENOUS
  Administered 2012-01-29: 3 mg via INTRAVENOUS

## 2012-01-29 MED ORDER — LACTATED RINGERS IV SOLN
INTRAVENOUS | Status: DC | PRN
  Administered 2012-01-29: 12:00:00 via INTRAVENOUS

## 2012-01-29 MED ORDER — GLYCOPYRROLATE 0.2 MG/ML IJ SOLN
INTRAMUSCULAR | Status: DC | PRN
  Administered 2012-01-29: .6 mg via INTRAVENOUS

## 2012-01-29 MED ORDER — SODIUM CHLORIDE 0.9 % IR SOLN
Status: DC | PRN
  Administered 2012-01-29: 2000 mL

## 2012-01-29 MED ORDER — PHENYLEPHRINE HCL 10 MG/ML IJ SOLN
10.0000 mg | INTRAMUSCULAR | Status: DC | PRN
  Administered 2012-01-29: 15 ug/min via INTRAVENOUS

## 2012-01-29 MED ORDER — PROPOFOL 10 MG/ML IV EMUL
INTRAVENOUS | Status: DC | PRN
  Administered 2012-01-29: 10 mg via INTRAVENOUS
  Administered 2012-01-29: 200 mg via INTRAVENOUS
  Administered 2012-01-29: 20 mg via INTRAVENOUS

## 2012-01-29 MED ORDER — MIDAZOLAM HCL 5 MG/5ML IJ SOLN
INTRAMUSCULAR | Status: DC | PRN
  Administered 2012-01-29 (×2): 2 mg via INTRAVENOUS

## 2012-01-29 MED ORDER — NEOSTIGMINE METHYLSULFATE 1 MG/ML IJ SOLN
INTRAMUSCULAR | Status: DC | PRN
  Administered 2012-01-29: 4 mg via INTRAVENOUS

## 2012-01-29 MED ORDER — SODIUM CHLORIDE 0.9 % IR SOLN
Status: DC | PRN
  Administered 2012-01-29: 1000 mL

## 2012-01-29 MED ORDER — DEXTROSE 5 % IV SOLN
2.0000 g | INTRAVENOUS | Status: DC
  Administered 2012-01-29 – 2012-02-05 (×8): 2 g via INTRAVENOUS
  Filled 2012-01-29 (×9): qty 2

## 2012-01-29 MED ORDER — MUPIROCIN 2 % EX OINT
TOPICAL_OINTMENT | CUTANEOUS | Status: AC
  Filled 2012-01-29: qty 22

## 2012-01-29 MED ORDER — SUFENTANIL CITRATE 50 MCG/ML IV SOLN
INTRAVENOUS | Status: DC | PRN
  Administered 2012-01-29 (×2): 10 ug via INTRAVENOUS
  Administered 2012-01-29 (×2): 40 ug via INTRAVENOUS

## 2012-01-29 MED ORDER — FLUORESCEIN-BENOXINATE 0.25-0.4 % OP SOLN
OPHTHALMIC | Status: DC | PRN
  Administered 2012-01-29: 1 [drp]

## 2012-01-29 MED ORDER — MUPIROCIN CALCIUM 2 % NA OINT
TOPICAL_OINTMENT | NASAL | Status: DC | PRN
  Administered 2012-01-29: 1 via NASAL

## 2012-01-29 MED ORDER — MORPHINE SULFATE 2 MG/ML IJ SOLN
1.0000 mg | INTRAMUSCULAR | Status: DC | PRN
  Administered 2012-01-29 – 2012-02-03 (×8): 1 mg via INTRAVENOUS
  Filled 2012-01-29 (×8): qty 1

## 2012-01-29 MED ORDER — OXYCODONE-ACETAMINOPHEN 5-325 MG PO TABS
1.0000 | ORAL_TABLET | ORAL | Status: DC | PRN
  Administered 2012-01-29 – 2012-01-31 (×9): 2 via ORAL
  Administered 2012-02-03: 1 via ORAL
  Administered 2012-02-04 – 2012-02-05 (×3): 2 via ORAL
  Filled 2012-01-29 (×11): qty 2
  Filled 2012-01-29: qty 1

## 2012-01-29 MED ORDER — ROCURONIUM BROMIDE 100 MG/10ML IV SOLN
INTRAVENOUS | Status: DC | PRN
  Administered 2012-01-29: 50 mg via INTRAVENOUS

## 2012-01-29 MED ORDER — HYDRALAZINE HCL 20 MG/ML IJ SOLN
INTRAMUSCULAR | Status: AC
  Administered 2012-01-29: 10 mg via INTRAVENOUS
  Filled 2012-01-29: qty 1

## 2012-01-29 MED ORDER — FLUORESCEIN-BENOXINATE 0.25-0.4 % OP SOLN
1.0000 [drp] | OPHTHALMIC | Status: AC
  Filled 2012-01-29: qty 5

## 2012-01-29 MED ORDER — FENTANYL CITRATE 0.05 MG/ML IJ SOLN
INTRAMUSCULAR | Status: DC | PRN
  Administered 2012-01-29: 50 ug via INTRAVENOUS

## 2012-01-29 SURGICAL SUPPLY — 72 items
ALLODERM TISSUE 4X16CM THICK (Tissue) IMPLANT
ATTRACTOMAT 16X20 MAGNETIC DRP (DRAPES) ×5 IMPLANT
BLADE RAD40 ROTATE 4M 4 5PK (BLADE) IMPLANT
BLADE RAD60 ROTATE M4 4 5PK (BLADE) IMPLANT
BLADE TRICUT ROTATE M4 4 5PK (BLADE) ×5 IMPLANT
BUR DIAMOND CURV 15X5 15D (BURR) ×5 IMPLANT
CANISTER SUCTION 2500CC (MISCELLANEOUS) ×10 IMPLANT
CLOTH BEACON ORANGE TIMEOUT ST (SAFETY) ×5 IMPLANT
COAGULATOR SUCT SWTCH 10FR 6 (ELECTROSURGICAL) ×5 IMPLANT
CONT SPEC 4OZ CLIKSEAL STRL BL (MISCELLANEOUS) IMPLANT
CORDS BIPOLAR (ELECTRODE) ×5 IMPLANT
CRADLE DONUT ADULT HEAD (MISCELLANEOUS) IMPLANT
DECANTER SPIKE VIAL GLASS SM (MISCELLANEOUS) ×5 IMPLANT
DRAIN BAG CSF ACCUDRAIN (MISCELLANEOUS) ×5 IMPLANT
DRAPE PROXIMA HALF (DRAPES) ×5 IMPLANT
DRESSING NASAL POPE 10X1.5X2.5 (GAUZE/BANDAGES/DRESSINGS) ×4 IMPLANT
DRESSING TELFA 8X3 (GAUZE/BANDAGES/DRESSINGS) ×5 IMPLANT
DRSG NASAL POPE 10X1.5X2.5 (GAUZE/BANDAGES/DRESSINGS) ×5
DRSG NASOPORE 8CM (GAUZE/BANDAGES/DRESSINGS) ×5 IMPLANT
DRSG OPSITE 6X11 MED (GAUZE/BANDAGES/DRESSINGS) ×10 IMPLANT
DURASEAL SPINE SEALANT 3ML (MISCELLANEOUS) ×5 IMPLANT
ELECT COATED BLADE 2.86 ST (ELECTRODE) ×5 IMPLANT
ELECT REM PT RETURN 9FT ADLT (ELECTROSURGICAL) ×5
ELECTRODE REM PT RTRN 9FT ADLT (ELECTROSURGICAL) ×4 IMPLANT
EXTERNAL CSF DRAINAGE KIT ×5 IMPLANT
FILTER ARTHROSCOPY CONVERTOR (FILTER) IMPLANT
FLOSEAL 10ML (HEMOSTASIS) IMPLANT
GAUZE SPONGE 4X4 16PLY XRAY LF (GAUZE/BANDAGES/DRESSINGS) ×5 IMPLANT
GLOVE ECLIPSE 6.5 STRL STRAW (GLOVE) ×15 IMPLANT
GLOVE ECLIPSE 7.5 STRL STRAW (GLOVE) ×15 IMPLANT
GLOVE SURG SS PI 7.5 STRL IVOR (GLOVE) ×5 IMPLANT
GOWN STRL NON-REIN LRG LVL3 (GOWN DISPOSABLE) ×20 IMPLANT
HEMOSTAT SURGICEL .5X2 ABSORB (HEMOSTASIS) ×5 IMPLANT
HEMOSTAT SURGICEL 2X14 (HEMOSTASIS) ×5 IMPLANT
KIT BASIN OR (CUSTOM PROCEDURE TRAY) ×5 IMPLANT
KIT ROOM TURNOVER OR (KITS) ×5 IMPLANT
MARKER SPHERE PSV REFLC NDI (MISCELLANEOUS) ×20 IMPLANT
NEEDLE 27GAX1X1/2 (NEEDLE) IMPLANT
NEEDLE SPNL 25GX3.5 QUINCKE BL (NEEDLE) IMPLANT
NS IRRIG 1000ML POUR BTL (IV SOLUTION) ×5 IMPLANT
PAD ARMBOARD 7.5X6 YLW CONV (MISCELLANEOUS) ×10 IMPLANT
PATTIES SURGICAL .5 X3 (DISPOSABLE) ×5 IMPLANT
PENCIL BUTTON HOLSTER BLD 10FT (ELECTRODE) ×5 IMPLANT
PENCIL FOOT CONTROL (ELECTRODE) IMPLANT
SHEATH ENDOSCRUB 0 DEG (SHEATH) ×5 IMPLANT
SHEATH ENDOSCRUB 30 DEG (SHEATH) IMPLANT
SHEATH ENDOSCRUB 45 DEG (SHEATH) ×5 IMPLANT
SOLUTION ANTI FOG 6CC (MISCELLANEOUS) ×5 IMPLANT
SPECIMEN JAR SMALL (MISCELLANEOUS) IMPLANT
STAPLER VISISTAT 35W (STAPLE) ×5 IMPLANT
STRIP CLOSURE SKIN 1/2X4 (GAUZE/BANDAGES/DRESSINGS) IMPLANT
SUT CHROMIC 4 0 G 3 (SUTURE) IMPLANT
SUT ETHILON 3 0 PS 1 (SUTURE) ×5 IMPLANT
SUT ETHILON 5 0 P 3 18 (SUTURE)
SUT NYLON ETHILON 5-0 P-3 1X18 (SUTURE) IMPLANT
SUT PLAIN 4 0 ~~LOC~~ 1 (SUTURE) IMPLANT
SUT SILK 2 0 REEL (SUTURE) IMPLANT
SUT SILK 2 0 SH (SUTURE) ×5 IMPLANT
SUT SILK 3 0 REEL (SUTURE) IMPLANT
SUT SILK 4 0 REEL (SUTURE) IMPLANT
SUT VIC AB 3-0 SH 27 (SUTURE)
SUT VIC AB 3-0 SH 27X BRD (SUTURE) IMPLANT
SWAB COLLECTION DEVICE MRSA (MISCELLANEOUS) IMPLANT
SYR 50ML SLIP (SYRINGE) IMPLANT
SYRINGE 10CC LL (SYRINGE) IMPLANT
TOWEL OR 17X24 6PK STRL BLUE (TOWEL DISPOSABLE) ×15 IMPLANT
TOWEL OR 17X26 10 PK STRL BLUE (TOWEL DISPOSABLE) ×5 IMPLANT
TRAY ENT MC OR (CUSTOM PROCEDURE TRAY) ×5 IMPLANT
TRAY FOLEY CATH 14FR (SET/KITS/TRAYS/PACK) ×5 IMPLANT
TUBE CONNECTING 12X1/4 (SUCTIONS) ×5 IMPLANT
WATER STERILE IRR 1000ML POUR (IV SOLUTION) ×5 IMPLANT
WIPE INSTRUMENT VISIWIPE 73X73 (MISCELLANEOUS) ×10 IMPLANT

## 2012-01-29 NOTE — Anesthesia Preprocedure Evaluation (Addendum)
Anesthesia Evaluation  Patient identified by MRN, date of birth, ID band Patient awake    Reviewed: Allergy & Precautions, H&P , NPO status , Patient's Chart, lab work & pertinent test results  Airway Mallampati: III  Neck ROM: Full  Mouth opening: Limited Mouth Opening  Dental  (+) Teeth Intact and Dental Advisory Given   Pulmonary  clear to auscultation        Cardiovascular hypertension, Regular Normal    Neuro/Psych    GI/Hepatic   Endo/Other  Diabetes mellitus-, Poorly Controlled, Type 2, Oral Hypoglycemic AgentsMorbid obesity  Renal/GU      Musculoskeletal   Abdominal   Peds  Hematology   Anesthesia Other Findings   Reproductive/Obstetrics                          Anesthesia Physical Anesthesia Plan  ASA: III  Anesthesia Plan: General   Post-op Pain Management:    Induction: Intravenous  Airway Management Planned: Oral ETT, Video Laryngoscope Planned and Awake Intubation Planned  Additional Equipment: Arterial line  Intra-op Plan:   Post-operative Plan: Extubation in OR  Informed Consent:   Dental advisory given  Plan Discussed with: CRNA, Anesthesiologist and Surgeon  Anesthesia Plan Comments:        Anesthesia Quick Evaluation

## 2012-01-29 NOTE — Preoperative (Signed)
Beta Blockers   Reason not to administer Beta Blockers:Not Applicable 

## 2012-01-29 NOTE — Consult Note (Signed)
Name: [name] MRN: [id number] DOB: [birth year]    LOS: 2  PCCM ADMISSION NOTE  History of Present Illness: 56 yo female with hx HTN, CVA, uncontrolled DM and chronic headache found to have L sphenoid Sternberg's canal encephalocele with CSF leak.  Admitted 2/19 prior to elective endoscopic endonasal transsphenoid transpterygoid repair of the left Sphenoid/Sternberg's canal encephalocele with post-op lumbar drainage.  Post operatively pt remained lethargic and plans were to leave pt intubated and PCCM consulted for vent management.  However on transfer to ICU she awoke and was extubated.  PCCM will follow in ICU.    Lines / Drains: Lumbar drain 2/21>>> R IJ CVL 2/21>>>  Cultures: none  Antibiotics: Ancef 2/21 x 1 (periop)  Tests / Events: 2/21>>> OR for repair CSF leak, sphenoidectomy  Past Medical History  Diagnosis Date  . CVA (cerebral infarction) 03/29/2004  . HTN (hypertension)     takes Amlodipine daily  . Hyperlipidemia     takes Pravastatin daily  . Shortness of breath     with exertion  . Headache     related to CSF leakage  . Dizziness     related to CSF leakage  . Stroke 03/2004  . Arthritis     knuckles  . Hx of gout   . Diarrhea   . Urinary frequency   . Urinary urgency   . Diabetes mellitus   . Anxiety     doesn't take any meds for this   Past Surgical History  Procedure Date  . Nm myoview ltd 08/25/2011    Low risk study, no evidence of ischemia, EF 44%  . Total abdominal hysterectomy 1994   Prior to Admission medications   Medication Sig Start Date End Date Taking? Authorizing Provider  amLODipine (NORVASC) 10 MG tablet Take 1 tablet (10 mg total) by mouth daily. 09/29/11 09/28/12 Yes Luetta Nutting, DO  aspirin 325 MG tablet Take 325 mg by mouth daily. 08/07/11 08/06/12 Yes Luetta Nutting, DO  glipiZIDE (GLUCOTROL) 5 MG tablet Take 1 tablet (5 mg total) by mouth 2 (two) times daily before a meal. 11/07/11 11/06/12 Yes Luetta Nutting, DO    hydrochlorothiazide (HYDRODIURIL) 25 MG tablet Take 1 tablet (25 mg total) by mouth daily. 11/07/11 11/06/12 Yes Luetta Nutting, DO  losartan (COZAAR) 25 MG tablet Take 1 tablet (25 mg total) by mouth daily. 11/07/11 11/06/12 Yes Luetta Nutting, DO  metFORMIN (GLUCOPHAGE) 1000 MG tablet Take 1 tablet (1,000 mg total) by mouth 2 (two) times daily with a meal. 11/07/11 11/06/12 Yes Luetta Nutting, DO  pravastatin (PRAVACHOL) 40 MG tablet Take 1 tablet (40 mg total) by mouth every evening. 11/07/11 11/06/12 Yes Luetta Nutting, DO  traMADol (ULTRAM) 50 MG tablet Take 50-100 mg by mouth every 6 (six) hours as needed. For pain.   Maximum dose= 8 tablets per day 11/20/11 11/19/12 Yes Luetta Nutting, DO  Lancets (ACCU-CHEK MULTICLIX) lancets Use to check blood sugar every morning 08/29/11   Luetta Nutting, DO   Allergies Allergies  Allergen Reactions  . Vicodin (Hydrocodone-Acetaminophen) Nausea And Vomiting    Family History Family History  Problem Relation Age of Onset  . Colon cancer Sister 14  . Breast cancer Mother 26  . Diabetes Sister 12  . Diabetes Sister 42  . Lung cancer Father   . Anesthesia problems Neg Hx   . Hypotension Neg Hx   . Malignant hyperthermia Neg Hx   . Pseudochol deficiency Neg Hx     Social History  reports  that she has been smoking.  She does not have any smokeless tobacco history on file. She reports that she drinks alcohol. She reports that she does not use illicit drugs.  Review Of Systems  - unable, pt groggy, confused post anesthesia  Vital Signs: Temp:  [97.6 F (36.4 C)-98.6 F (37 C)] 97.6 F (36.4 C) (02/21 1000) Pulse Rate:  [74-90] 90  (02/21 1000) Resp:  [18-20] 20  (02/21 1000) BP: (132-159)/(63-73) 138/73 mmHg (02/21 1000) SpO2:  [95 %-100 %] 96 % (02/21 1000) I/O last 3 completed shifts: In: 1611.2 [P.O.:960; I.V.:651.2] Out: -   Physical Examination:  General: obese female, NAD Neuro: groggy, confused at times, follows commands  HEENT:  mm dry, L nare bloody with packing CV: s1s2 rrr, distant PULM: resps even non labored on simple mask, diminished bases, few scattered ronchi GI: obese, soft, +bs Extremities:  Warm and dry, scant BLE edema     Labs and Imaging:   CBC    Component Value Date/Time   WBC 8.3 01/28/2012 0620   RBC 4.79 01/28/2012 0620   HGB 14.7 01/28/2012 0620   HCT 44.3 01/28/2012 0620   PLT 179 01/28/2012 0620   MCV 92.5 01/28/2012 0620   MCH 30.7 01/28/2012 0620   MCHC 33.2 01/28/2012 0620   RDW 14.2 01/28/2012 0620   LYMPHSABS 1.9 01/27/2012 1801   MONOABS 0.5 01/27/2012 1801   EOSABS 0.3 01/27/2012 1801   BASOSABS 0.0 01/27/2012 1801    BMET    Component Value Date/Time   NA 139 01/28/2012 0620   K 3.6 01/28/2012 0620   CL 98 01/28/2012 0620   CO2 29 01/28/2012 0620   GLUCOSE 157* 01/28/2012 0620   BUN 24* 01/28/2012 0620   CREATININE 1.80* 01/28/2012 0620   CREATININE 1.33* 09/09/2011 1507   CALCIUM 8.6 01/28/2012 0620   GFRNONAA 31* 01/28/2012 0620   GFRAA 36* 01/28/2012 0620    No results found for this basename: INR in the last 168 hours  ABG    Component Value Date/Time   PHART 7.339* 12/20/2008 0130   PCO2ART 47.6* 12/20/2008 0130   PO2ART 76.0* 12/20/2008 0130   HCO3 24.9* 12/20/2008 0130   TCO2 22.2 12/20/2008 0130   ACIDBASEDEF 0.8 12/20/2008 0130   O2SAT 94.9 12/20/2008 0130    No results found.    Assessment and Plan:  1. Acute resp failure - post op sphenoidectomy, repair CSF leak.  Pt initially was to remain intubated post op, but is now extubated.  Unclear whether she self extubated or if this was intentional. She transferred to 3100 without being recovered in PACU and without transfer orders. The reason for this is also unclear. We are consulted regarding post-op resp failure. She is obese and is at risk for OSA/OHS, hopefully not to be exacerbated by her surgical procedure and nasal obstruction. Now in ICU on simple mask, resp status appears stable.  PLAN -  Closely monitor resp  status given extensive surgery, obesity O2 CXR now and in am  Pulmonary hygiene  2. HTN -  cont previous anti-htn Add PRN hydralazine Decrease Maint IV fluids  3. S/p L sphenoidectomy PLAN -  Pain control  Per ENT NPO x meds for now until ok with ENT  4. DM - uncontrolled at baseline. Stopped taking her DM meds several months prior to admit.  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  PLAN -  Cont lantus, SSI   5. Acute on chronic renal failure - likely r/t dehydration pta.  PLAN -  Gentle fluids F/u chem Hold hctz  WHITEHEART,KATHRYN, NP 01/29/2012  4:54 PM Pager: [phone]  *Care during the described time interval was provided by me and/or other providers on the critical care team. I have reviewed this patient's available data, including medical history, events of note, physical examination and test results as part of my evaluation. Baltazar Apo, MD, PhD 01/29/2012, 5:43 PM [facility] [phone] or if no answer [phone]

## 2012-01-29 NOTE — Anesthesia Postprocedure Evaluation (Signed)
  Anesthesia Post-op Note  Patient: [name]  Procedure(s) Performed: Procedure(s) (LRB): SPHENOIDECTOMY (Left) ENDOSCOPIC SINUS SURGERY WITH STEALTH (N/A) PLACEMENT OF LUMBAR DRAIN (N/A)  Patient Location: Neuro ICU Anesthesia Type: General  Level of Consciousness: awake, alert  and oriented  Airway and Oxygen Therapy: Patient Spontanous Breathing and Patient connected to nasal cannula oxygen  Post-op Pain: mild  Post-op Assessment: Post-op Vital signs reviewed, Patient's Cardiovascular Status Stable, Respiratory Function Stable, Patent Airway, No signs of Nausea or vomiting and Pain level controlled  Post-op Vital Signs: Reviewed and stable  Complications: No apparent anesthesia complications

## 2012-01-29 NOTE — Progress Notes (Signed)
Family Medicine Teaching Service Attending Note  I interviewed and examined patient [name] and reviewed their tests and x-rays.  I discussed with Dr. Francesco Sor and reviewed their note for today.  I agree with their assessment and plan.     Additionally  Much improved blood sugar and creatinine  Stable for surgery

## 2012-01-29 NOTE — Op Note (Signed)
01/27/2012 - 01/29/2012  3:24 PM  PATIENT:  [name]  56 y.o. female  PRE-OPERATIVE DIAGNOSIS:  left sphenoid Sternberg's canal encephalocele with cebrospinal spinal fluid leak  POST-OPERATIVE DIAGNOSIS:  left sphenoid Sternberg's canal encephalocele with cebrospinal spinal fluid leak  PROCEDURE:  Procedure(s): SPHENOIDECTOMY NASAL RECONSTRUCTION WITH SEPTAL REPAIR ENDOSCOPIC SINUS SURGERY WITH STEALTH,  PLACEMENT OF LUMBAR DRAIN  SURGEON:  Surgeon(s): Ruby Cola, MD Otilio Connors, MD Otilio Connors, MD    ANESTHESIA:   general  EBL:  Total I/O In: 2500 [I.V.:2500] Out: 225 [Urine:75; Blood:150]  BLOOD ADMINISTERED:none  DRAINS: lumbar drain   SPECIMEN:  No Specimen  DICTATION: Patient with spontaneous CSF fistula found to have a defect in the medial temporal bone in the foramen ovale with a small encephalocele and appears CSF leaking into the sphenoid sinus from that and with Dr. Simeon Craft endoscopic  repair of this and insertion lumbar drain.  Patient on the operative general anesthesia induced patient was then placed in the lateral position right side been up and patient prepped and draped in a sterile fashion. A lumbar drain was then placed. Spondylitic placed in the spine with the thecal sac with good CSF flow in the lumbar drain tubing was then placed to through the needle needle was then removed with good flow from the catheter the catheter within the fracture with staples and OpSite and connected to a drainage bag and still had good drainage from the system.  Patient prepped draped sterile fashion and Dr. Simeon Craft will dictated under separate dictation the approach into the skin at sinus to the defect. When serendipitously so the encephalocele and we bipolared the encephalocele is slowing up at [facility] and did hemostasis at. The floor seemed November and only a small amount of CSF coming up from the area looked pretty good. Skull defect was very small and we did not  feel as possible to place a piece of artificial dural graft inside of the skull is a the area was then packed mucosal graft which will be dictated by Dr. Simeon Craft separate dictation.    Marland Kitchen    PLAN OF CARE: Admit to inpatient   PATIENT DISPOSITION:  PACU - hemodynamically stable.

## 2012-01-29 NOTE — Op Note (Signed)
4:39 PM 01/29/12 Surgeon(s): Thomes Lolling MD PhD and Otilio Connors MD  Procedures Performed: [phone] image guidance 31255-LT left total ethmoidectomy [id number] left maxillary antrostomy with tissue removal 31288-LT left sphenoidotomy with tissue removal 20926-tissue graft, other: left middle turbinate mucosal graft to left lateral sphenoid defect 31291-62-sinus endoscopy, surgical with endoscopic transsphenoid repair of left lateral sphenoid/  Operative Findings: a left lateral sphenoid/Sternberg's canal encephalocele resected/bipolared and repaired with a left middle turbinate mucosal graft, bilateral posterior septal branches of the sphenopalatine artery preserved (bilateral pedicles for nasoseptal flaps preserved), no skull base/vascular/orbital injury, severe right septal deviation/septal spur.  Specimens: left sinus contents not sent  PREOPERATIVE DIAGNOSIS:left lateral sphenoid/Sternberg's canal encephalocele with CSF leak  POSTOPERATIVE DIAGNOSIS:left lateral sphenoid/Sternberg's canal encephalocele with CSF leak   ANESTHESIA: General endotracheal.  ESTIMATED BLOOD LOSS: Approximately 100 mL.  HISTORY OF PRESENT ILLNESS: The patient is a  56yo caucasian female with a history of left CSF rhinorrhea. She was referred to Dr. Erik Obey who ordered a CT maxillofacial that demonstrated a left lateral opacified sphenoid with associated bony skull base defect. Image guidance protocol CT and MRI confirmed this. She was referred to Dr. Luiz Ochoa and Dr. Simeon Craft for endoscopic endonasal repair.  PROCEDURE: The patient was brought to the operating room and placed in the supine position. After adequate endotracheal anesthesia was obtained, the skin was draped in sterile fashion. Dr. Luiz Ochoa placed a lumbar drain which is dictated in his operative note.  Brief nasal endoscopy on the right demonstrated a severe right nasal septal deviation with septal spur causing severe nasal obstruction.    Attention then was directed toward the left sinuses. First the left middle turbinate was resected using the straight thru-cut forceps. The left middle turbinate mucosa was removed with the Cottle elevator and Adson, the mucosal side was marked with a sterile marker and the middle turbinate mucosal graft was set aside in sterile saline to be used later. The left uncinate process was identified using the 0 degree endoscope and the left uncinate process was  removed systematically superiorly to inferiorly with back-biting forceps. Next, the left maxillary sinus was identified and the medial wall of the left maxillary sinus was widely opened using the microdebrider.The natural ostium was widely opened using the backbiter.  The anterior and posterior ethmoid air cells were entered after identifying the left ethmoid bulla using the image guidance suction and dissected up to the skull base and out to the lamina papyrecea using the 55 degree curette and the 47mm Kerrison punch. All of the ethmoid cells were meticulously dissected out using the Kerrison and debrider. The skull base and lamina papyracea were preserved throughout.   The left sphenoid natural ostium was then identified using the image guidance suction and the left sphenoid was widely opened using the 98mm Kerrison all the way to the skull base and lamina papyracea. The inferior portion of the superior turbinate was carefully removed to improve exposure. The bipolar was used to carefully cauterize the middle turbinate and lateral nasal branches of the left sphenopalatine artery, but the Cottle elevator was carefully used to reflect the main SPA and the posterior septal branches of the SPA inferiorly in a Rescue Flap fashion to preserve the left nasoseptal flap pedicle. The anterior sphenoid floor was resected using the down Kerrison and straight thru-cut and the lateral sphenoid mucosa and bone anterior to the left lateral sphenoid recess were carefully  resected using the Kerrison. The pterygopalatine fossa contents were preserved.  The left lateral  sphenoid encephalocele was now visible in 360 degrees. The mucosa was carefully stripped from the left sphenoid using the straight Blakesley. The opticocarotid recess, carotid artery, optic nerve and chiasm, and sella were all left undisturbed but the mucosa covering them was removed to leave bare bone. At this point using a 2-surgeon/4-handed endoscopic technique Dr. Luiz Ochoa and Dr. Simeon Craft resected the left sphenoid encephalocele by meticulously bipolaring the encephalocele at its base until it was cauterized flush with the left lateral sphenoid wall. Application of dilute Fluorescein demonstrated sessile CSF at the encephalocele site on the dura but no active extravasation of CSF. At this point the left middle turbinate mucosal graft was then carefully placed over the skull base defect with >100% defect coverage, with the mucosal side facing out and the periosteal side facing the dura. The repair was then bolstered with surgicel, Duraseal, Nasopore, and then a 4cm left fingercot that was secured to the patient's left cheek.   The patient's stomach and nasopharynx were suctioned out using a flexible OGtube. The patient was awakened from anesthesia and extubated without difficulty. The patient tolerated the procedure well and was taken to the neurosurgical ICU in stable condition.  Dr. Ruby Cola was present for the entire procedure, Dr. Hazle Coca was present for the encephalocele resection/repair and lumbar drain placement.  01/29/12 4:39 PM Ruby Cola

## 2012-01-29 NOTE — Progress Notes (Signed)
Subjective: Says she has had a little CSF rhinorrhea this morning. Little anxious about surgery. Otherwise no complaints. No pain, nausea or vomiting.   Objective: Vital signs in last 24 hours: Temp:  [98 F (36.7 C)-98.6 F (37 C)] 98 F (36.7 C) (02/21 [id number]) Pulse Rate:  [74-78] 78  (02/21 0613) Resp:  [18-20] 18  (02/21 0613) BP: (132-159)/(63-72) 136/63 mmHg (02/21 0613) SpO2:  [95 %-100 %] 95 % (02/21 [id number]) Weight change:  Last BM Date: 01/26/12  Intake/Output from previous day: 02/20 0701 - 02/21 0700 In: 1611.2 [P.O.:960; I.V.:651.2] Out: -  Intake/Output this shift:   Physical Exam Gen: Seated comfortably. No apparent distress. Pleasant. Obese HEENT: Atraumatic. Normocephalic. EOMI. No rhinorrhea.  Pulm: Normal WOB. CTAB. CV: RRR. No murmurs, rubs or gallops. Abd: Obese. Soft. NT. ND. No masses. BS+.  Ext: Warm, well perfused. No rashes. Neuro: AOx3. No focal deficits.  Lab Results:  Basename 01/28/12 0620 01/27/12 1801  WBC 8.3 10.5  HGB 14.7 15.0  HCT 44.3 44.2  PLT 179 186   BMET  Basename 01/28/12 0620 01/27/12 2304  NA 139 133*  K 3.6 3.8  CL 98 92*  CO2 29 31  GLUCOSE 157* 320*  BUN 24* 28*  CREATININE 1.80* 2.04*  CALCIUM 8.6 8.5    Studies/Results: No results found.  Medications: I have reviewed the patient's current medications.  Assessment/Plan: 56 yo F with hyperosmolar hyperglycemic state and acute renal failure admitted for control prior to planned surgery on 2/21 by ENT for CSF rhinorrhea.   1: DM T2: Previously borderline controlled with A1c 8.4 on Metformin and Amaryl but stopped treatment 2 months ago. - CBG 600 on admission. Placed on glucomander and hydrated. Glucose normalized. - Stopped metformin with elevated Cr.  - Placed on Lantus 30 daily and SSI. Glucose trending up over last 24 hours. Will titrate up insulin regimen post surgery as needed. Will also plan on patient going home with an insulin regimen.  2. Acute on  chronic kidney failure: Creatinine baseline of 1.33. 2.22 on admission, likely due to osmotic diuresis and dehydration.  - Hydrated and Cr decreased to 1.80 within 24 hours. - Will recheck BMP and continue to hydrate and encourage PO fluids   3. Hyponatremia. Mild when corrected for hyperglycemia. Likely secondary to dehydration.  - Normalized after hydration   4. Hypertension: BP elevated mildly on admission. Now normotensive. - Home HCTZ and norvasc. Hold ARB for now.   5. Hyperlipidemia  - On Pravastatin. Will continue.   6. CSF Rhinorrhea  - Patient examined by Dr. Simeon Craft. Will have surgery today at noon. Will follow surgery recommendations. - Will hold ASA as instructed for pre-operative preparation.  - Will give Ancef prior to surgery  7. FEN/GI: NPO for surgery.  8. PPx: Heparin post surgery 9. Dispo. Full code.    LOS: 2 days   Jonetta Osgood, [facility] Teaching Service 01/29/2012, 10:01 AM   PGY-2 ADDENDUM:  I have seen and examined patient and discussed case with Zachery Dakins.  Patient has no complaints today, she wants to eat but understands that she cannot eat prior to surgery.  No acute events overnight.  Physical Exam: Gen: in no distress, sitting in bed, surrounded by family HEENT: Atraumatic. Normocephalic.  No CSF rhinorrhea.  Pulm: Normal WOB. CTAB. CV: RRR. No murmurs, rubs or gallops. Abd: Obese. Soft. NT. ND. No masses. BS+.  MSK: no C/C/E  Assessment and Plan as above. - ENT surgery for CSF  leak scheduled for today.  NPO since MN. - Follow up after surgery and recommendations for starting diet - Will need better control of sugars and insulin regimen  - Disposition: pending post-op clinical improvement and hyperglycemic control  DE LA CRUZ,Aletha Allebach

## 2012-01-29 NOTE — Progress Notes (Signed)
Family Medicine Teaching Service Attending Note  I discussed patient Schepers  with Dr. Vallarie Mare and reviewed their note for today.  I agree with their assessment and plan.

## 2012-01-29 NOTE — Transfer of Care (Signed)
Immediate Anesthesia Transfer of Care Note  Patient: [name]  Procedure(s) Performed: Procedure(s) (LRB): SPHENOIDECTOMY (Left) ENDOSCOPIC SINUS SURGERY WITH STEALTH (N/A) PLACEMENT OF LUMBAR DRAIN (N/A)  Patient Location: PACU and ICU  Anesthesia Type: General  Level of Consciousness: awake, alert  and oriented  Airway & Oxygen Therapy: Patient Spontanous Breathing and Patient connected to face mask oxygen  Post-op Assessment: Report given to PACU RN  Post vital signs: Reviewed and stable  Complications: No apparent anesthesia complications

## 2012-01-30 ENCOUNTER — Inpatient Hospital Stay (HOSPITAL_COMMUNITY): Payer: Self-pay

## 2012-01-30 LAB — BASIC METABOLIC PANEL
BUN: 13 mg/dL (ref 6–23)
CO2: 31 mEq/L (ref 19–32)
Calcium: 8 mg/dL — ABNORMAL LOW (ref 8.4–10.5)
Chloride: 97 mEq/L (ref 96–112)
Creatinine, Ser: 1.39 mg/dL — ABNORMAL HIGH (ref 0.50–1.10)
GFR calc Af Amer: 49 mL/min — ABNORMAL LOW (ref >90)
GFR calc non Af Amer: 42 mL/min — ABNORMAL LOW (ref >90)
Glucose, Bld: 245 mg/dL — ABNORMAL HIGH (ref 70–99)
Potassium: 3.5 mEq/L (ref 3.5–5.1)
Sodium: 136 mEq/L (ref 135–145)

## 2012-01-30 LAB — PRO B NATRIURETIC PEPTIDE: Pro B Natriuretic peptide (BNP): 775.8 pg/mL — ABNORMAL HIGH (ref 0–125)

## 2012-01-30 LAB — CBC
HCT: 40.1 % (ref 36.0–46.0)
Hemoglobin: 12.8 g/dL (ref 12.0–15.0)
MCH: 30.4 pg (ref 26.0–34.0)
MCHC: 31.9 g/dL (ref 30.0–36.0)
MCV: 95.2 fL (ref 78.0–100.0)
Platelets: 163 10*3/uL (ref 150–400)
RBC: 4.21 MIL/uL (ref 3.87–5.11)
RDW: 14.8 % (ref 11.5–15.5)
WBC: 11.7 10*3/uL — ABNORMAL HIGH (ref 4.0–10.5)

## 2012-01-30 LAB — GLUCOSE, CAPILLARY
Glucose-Capillary: 242 mg/dL — ABNORMAL HIGH (ref 70–99)
Glucose-Capillary: 248 mg/dL — ABNORMAL HIGH (ref 70–99)
Glucose-Capillary: 244 mg/dL — ABNORMAL HIGH (ref 70–99)

## 2012-01-30 MED ORDER — INSULIN GLARGINE 100 UNIT/ML ~~LOC~~ SOLN
35.0000 [IU] | Freq: Every day | SUBCUTANEOUS | Status: DC
  Administered 2012-01-30: 35 [IU] via SUBCUTANEOUS

## 2012-01-30 MED ORDER — POTASSIUM CHLORIDE 20 MEQ PO PACK: 40.0000 meq | PACK | Freq: Once | ORAL | Status: DC

## 2012-01-30 MED ORDER — MUPIROCIN 2 % EX OINT
1.0000 "application " | TOPICAL_OINTMENT | Freq: Three times a day (TID) | CUTANEOUS | Status: DC
  Administered 2012-01-30 – 2012-02-06 (×22): 1 via NASAL
  Filled 2012-01-30 (×2): qty 22

## 2012-01-30 MED ORDER — POTASSIUM CHLORIDE CRYS ER 20 MEQ PO TBCR
40.0000 meq | EXTENDED_RELEASE_TABLET | Freq: Once | ORAL | Status: AC
  Administered 2012-01-30: 40 meq via ORAL
  Filled 2012-01-30: qty 2

## 2012-01-30 NOTE — Plan of Care (Signed)
Problem: Consults Goal: Diagnosis-Diabetes Mellitus Outcome: Completed/Met Date Met:  01/30/12 Hyperglycemia

## 2012-01-30 NOTE — Progress Notes (Signed)
Subjective: Patient reports no c/o , no nasal drainage  Objective: Vital signs in last 24 hours: Temp:  [97.5 F (36.4 C)-98.2 F (36.8 C)] 97.5 F (36.4 C) (02/22 0700) Pulse Rate:  [66-90] 70  (02/22 0800) Resp:  [11-20] 15  (02/22 0800) BP: (105-159)/(35-105) 125/53 mmHg (02/22 0800) SpO2:  [91 %-98 %] 92 % (02/22 0800) Arterial Line BP: (91-188)/(50-81) 91/76 mmHg (02/22 0700)  Intake/Output from previous day: 02/21 0701 - 02/22 0700 In: 4070 [P.O.:420; I.V.:3650] Out: 1461 [Urine:975; Drains:336; Blood:150] Intake/Output this shift: Total I/O In: 110 [P.O.:60; I.V.:50] Out: 9 [Drains:9]  Lumbar drain working neuro - A/A/Ox3 , EOMI, moves all well,   Lab Results:  Basename 01/30/12 0530 01/28/12 0620  WBC 11.7* 8.3  HGB 12.8 14.7  HCT 40.1 44.3  PLT 163 179   BMET  Basename 01/30/12 0530 01/29/12 1350 01/28/12 0620  NA 136 -- 139  K 3.5 -- 3.6  CL 97 -- 98  CO2 31 -- 29  GLUCOSE 245* 212* --  BUN 13 -- 24*  CREATININE 1.39* -- 1.80*  CALCIUM 8.0* -- 8.6    Studies/Results: Dg Chest Port 1 View  01/30/2012  *RADIOLOGY REPORT*  Clinical Data: Respiratory failure  PORTABLE CHEST - 1 VIEW  Comparison: 01/29/2012  Findings: Right internal jugular central line is unchanged with its tip at the SVC/RA junction.  Venous hypertension and mild interstitial edema persist.  I think there is more volume loss in both lower lobes compared to yesterday.  No discernible effusion.  IMPRESSION: Worsening volume loss in the lower lobes.  Continued suspicion of fluid overload/mild edema  Original Report Authenticated By: Jules Schick, M.D.   Dg Chest Port 1 View  01/29/2012  *RADIOLOGY REPORT*  Clinical Data: Respiratory failure.  PORTABLE CHEST - 1 VIEW  Comparison: 11/07/2011  Findings: There is cardiomegaly.  Vascular congestion and mild interstitial prominence.  Question early interstitial edema.  No effusions or acute bony abnormality.  Right central line tip is at the  cavoatrial junction.  No pneumothorax.  IMPRESSION: Cardiomegaly, vascular congestion.  Question mild interstitial edema.  Original Report Authenticated By: Raelyn Number, M.D.    Assessment/Plan: PT doing well - cont. Lumbar drain 5-7 days -   10-15 cc/hour  LOS: 3 days     Kit Brubacher R, MD 01/30/2012, 8:48 AM

## 2012-01-30 NOTE — Progress Notes (Signed)
Name: LEEOLA BURACKER MRN: [id number] DOB: Oct 24, 1956    LOS: 3  PCCM PROGRESS NOTE  History of Present Illness:  56 yo female with hx HTN, CVA, uncontrolled DM and chronic headache found to have L sphenoid Sternberg's canal encephalocele with CSF leak. Admitted 2/19 prior to elective endoscopic endonasal transsphenoid transpterygoid repair of the left Sphenoid/Sternberg's canal encephalocele with post-op lumbar drainage. Post operatively pt remained lethargic and plans were to leave pt intubated and PCCM consulted for vent management. However on transfer to ICU she awoke and was extubated. PCCM will follow in ICU  Lines / Drains: Lumbar drain 2/21>>>  R IJ CVL 2/21>>>  Cultures: None  Antibiotics: 2/21 Ancef>>>2/21 2/21 Ceftriaxone>>>  Tests / Events: 2/21>>> OR for repair CSF leak, sphenoidectomy 2/22- no resp distress   Vital Signs: Temp:  [97.5 F (36.4 C)-98.2 F (36.8 C)] 97.5 F (36.4 C) (02/22 0700) Pulse Rate:  [66-89] 78  (02/22 1000) Resp:  [11-18] 16  (02/22 1000) BP: (105-159)/(35-123) 156/123 mmHg (02/22 1000) SpO2:  [91 %-98 %] 94 % (02/22 1000) Arterial Line BP: (91-188)/(50-81) 91/76 mmHg (02/22 0700) I/O last 3 completed shifts: In: 4070 [P.O.:420; I.V.:3650] Out: 1461 [Urine:975; Drains:336; Blood:150]  Physical Examination: General:  No distress Neuro:  Awake, alert, nonfocal HEENT: obese Neck:  obese Cardiovascular: s1 s2 RRR distant   Lungs:  CTA, reduced Abdomen: soft, BS wnl no r, obese  Ventilator settings:  None  Labs and Imaging:  Basic Metabolic Panel:  Basename 01/30/12 0530 01/29/12 1350 01/28/12 0620 01/27/12 2304  NA 136 -- 139 --  K 3.5 -- 3.6 --  CL 97 -- 98 --  CO2 31 -- 29 --  GLUCOSE 245* 212* -- --  BUN 13 -- 24* --  CREATININE 1.39* -- 1.80* --  CALCIUM 8.0* -- 8.6 --  MG -- -- -- 2.0  PHOS -- -- -- 3.8   CBC:  Basename 01/30/12 0530 01/28/12 0620 01/27/12 1801  WBC 11.7* 8.3 --  NEUTROABS -- -- 7.8*  HGB  12.8 14.7 --  HCT 40.1 44.3 --  MCV 95.2 92.5 --  PLT 163 179 --  CBG:  Basename 01/30/12 0754 01/29/12 2202 01/29/12 1737 01/29/12 0751 01/28/12 2115 01/28/12 1636  GLUCAP 242* 242* 243* 332* 316* 284*   Urine Drug Screen: Drugs of Abuse     Component Value Date/Time   LABOPIA POSITIVE* 07/05/2010 0214   COCAINSCRNUR POSITIVE* 07/05/2010 0214   LABBENZ NONE DETECTED 07/05/2010 0214   AMPHETMU NONE DETECTED 07/05/2010 0214   THCU RESULTS UNAVAILABLE DUE TO INTERFERING SUBSTANCE RECOMMEND RECOLLECT* 07/05/2010 0214   LABBARB  Value: NONE DETECTED        DRUG SCREEN FOR MEDICAL PURPOSES ONLY.  IF CONFIRMATION IS NEEDED FOR ANY PURPOSE, NOTIFY LAB WITHIN 5 DAYS.        LOWEST DETECTABLE LIMITS FOR URINE DRUG SCREEN Drug Class       Cutoff (ng/mL) Amphetamine      1000 Barbiturate      200 Benzodiazepine   [id number] Tricyclics       [id number] Opiates          300 Cocaine          300 THC              50 07/05/2010 0214    Urinalysis:  Basename 01/27/12 1826  COLORURINE YELLOW  LABSPEC 1.025  PHURINE 5.5  GLUCOSEU >1000*  HGBUR TRACE*  [place] NEGATIVE  UROBILINOGEN  0.2  NITRITE NEGATIVE  LEUKOCYTESUR NEGATIVE   Assessment and Plan:  1. Acute resp failure - post op sphenoidectomy, repair CSF leak. Pt initially was to remain intubated post op, but is now extubated. Unclear whether she self extubated or if this was intentional. She transferred to 3100 without being recovered in PACU and without transfer orders. The reason for this is also unclear. We are consulted regarding post-op resp failure. She is obese and is at risk for OSA/OHS, hopefully not to be exacerbated by her surgical procedure and nasal obstruction.   PLAN -  Remains on [place] O2 With nasal packing even, no desaturations No role CPAP nocturnal at this stage pcxr reviewed, improved int changes, edema? Obesity affect  2. HTN -  cont previous anti-htn  PRN hydralazine , not needed kvo  consider , but output reported low just now, assess flush, may need UA, urine na althuogh crt improving At risk DI, SIADH  3. S/p L sphenoidectomy  PLAN -  Pain control  Per ENT  Diet started  4. DM - uncontrolled at baseline. Stopped taking her DM meds several months prior to admit. 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  PLAN -  Cont lantus, SSI  May need lantus increase  5. Acute on chronic renal failure, low output? PLAN -  Chem in am  Consider flush foley May need to re establish volume status  6. Substances abuse   Best practices / Disposition: -->ICU status under PCCM -->full code -->Heparin for DVT Px -->diet: carb modified -->family updated at bedside  Will sign off, call if needed in future NO CPAP or nocturnal support needed at this stage [place] O2   Lavon Paganini. Titus Mould, MD, [place] Pgr: [facility]

## 2012-01-30 NOTE — Progress Notes (Signed)
Subjective: Says surgery went well yesterday and doing good today. Complains of slight shortness of breath. No headache or pain otherwise and comfortable with plan for drain, nasal packing and antibiotics.   Objective: Vital signs in last 24 hours: Temp:  [97.4 F (36.3 C)-98.2 F (36.8 C)] 97.4 F (36.3 C) (02/22 1200) Pulse Rate:  [66-89] 67  (02/22 1200) Resp:  [11-18] 13  (02/22 1200) BP: (105-159)/(35-123) 118/52 mmHg (02/22 1200) SpO2:  [91 %-98 %] 94 % (02/22 1200) Arterial Line BP: (91-188)/(50-81) 91/76 mmHg (02/22 0700) Weight change:  Last BM Date: 01/26/12  Intake/Output from previous day: 02/21 0701 - 02/22 0700 In: 4070 [P.O.:420; I.V.:3650] Out: 1461 [Urine:975; Drains:336; Blood:150] Intake/Output this shift: Total I/O In: 582 [P.O.:300; I.V.:250; Other:30; IV Piggyback:2] Out: 207 [Urine:150; Drains:57]  Physical Exam  Gen: Laying comfortably propped up on pillows. No apparent distress. Pleasant. Obese  HEENT: Atraumatic. Normocephalic. EOMI. No rhinorrhea.  Pulm: Normal WOB. CTAB. Slightly decreased breath sound bases. No crackles CV: RRR. No murmurs, rubs or gallops.  Abd: Obese. Soft. NT. ND. No masses. BS+.  Ext: Warm, well perfused. No rashes. No edema. Neuro: AOx3. No focal deficits.  Lab Results:  Basename 01/30/12 0530 01/28/12 0620  WBC 11.7* 8.3  HGB 12.8 14.7  HCT 40.1 44.3  PLT 163 179   BMET  Basename 01/30/12 0530 01/29/12 1350 01/28/12 0620  NA 136 -- 139  K 3.5 -- 3.6  CL 97 -- 98  CO2 31 -- 29  GLUCOSE 245* 212* --  BUN 13 -- 24*  CREATININE 1.39* -- 1.80*  CALCIUM 8.0* -- 8.6    Studies/Results: Dg Chest Port 1 View  01/30/2012  *RADIOLOGY REPORT*  Clinical Data: Respiratory failure  PORTABLE CHEST - 1 VIEW  Comparison: 01/29/2012  Findings: Right internal jugular central line is unchanged with its tip at the SVC/RA junction.  Venous hypertension and mild interstitial edema persist.  I think there is more volume loss in both  lower lobes compared to yesterday.  No discernible effusion.  IMPRESSION: Worsening volume loss in the lower lobes.  Continued suspicion of fluid overload/mild edema  Original Report Authenticated By: Jules Schick, M.D.   Dg Chest Port 1 View  01/29/2012  *RADIOLOGY REPORT*  Clinical Data: Respiratory failure.  PORTABLE CHEST - 1 VIEW  Comparison: 11/07/2011  Findings: There is cardiomegaly.  Vascular congestion and mild interstitial prominence.  Question early interstitial edema.  No effusions or acute bony abnormality.  Right central line tip is at the cavoatrial junction.  No pneumothorax.  IMPRESSION: Cardiomegaly, vascular congestion.  Question mild interstitial edema.  Original Report Authenticated By: Raelyn Number, M.D.    Medications: I have reviewed the patient's current medications.  Assessment/Plan: 56 yo F with hyperosmolar hyperglycemic state and acute renal failure admitted for control prior to planned surgery on 2/21 by ENT for CSF rhinorrhea. Now POD #1 s/p surgery .  1: DM T2: Previously borderline controlled with A1c 8.4 on Metformin and Amaryl but stopped treatment 2 months ago. - CBG 600 on admission. Placed on glucomander and hydrated. Glucose normalized.  - Stopped metformin with elevated Cr.  - Placed on Lantus 30 daily and SSI. Glucoses over 200. Will titrate up insulin regimen for tighter control. Will also plan on patient going home with an insulin regimen.   2. Acute on chronic kidney failure: Creatinine baseline of 1.33. 2.22 on admission, likely due to osmotic diuresis and dehydration.  - Hydrated and Cr decreased to 1.80 within  24 hours. Now 1.39 almost baseline. Will stop IV fluids.  3. Pulmonary Edema: - Now with some pulmonary edema on CXR. No crackles or LE edema. Stop IV fluids and check CXR tomorrow. - Obese, with reported history of shortness of breadth, sleeps with 2 pillows, wakes up gasping for air. Will check BNP.  4. Hyponatremia. On admission now  resolved. - Normalized after hydration   5. Hypertension: BP elevated mildly on admission. Now normotensive.  - Home HCTZ and norvasc. Hold ARB for now.   6. Hyperlipidemia  - On Pravastatin. Will continue.   7. CSF Rhinorrhea  - Had surgery yesterday. Will follow surgery recommendations. Plan for lumbar drain at 10 ml/hr for 5 days and nasal packing for 2 weeks. On Rocephin while inpatient than oral antibiotics until nasal packing removed. - No rhinorrhea, headache or deficits post surgery. - Pain control  8. FEN/GI: Advance diet as tolerated. Carb modified. 9. PPx: Heparin. 10. Dispo. Full code.   LOS: 3 days   Jonetta Osgood, [facility] Teaching Service 01/30/2012, 1:21 PM  2:06 PM  01/30/2012  Lyndee Hensen MD  Patient seen and examined at the same time as [place]. I have reviewed and edited the above note as needed.  I agree with the above findings, refer to the above note for A/P.  My independent PE is below: PE:  Filed Vitals:   01/30/12 1100 01/30/12 1200 01/30/12 1300 01/30/12 1400  BP: 133/60 118/52 113/55 137/54  Pulse: 69 67 74 74  Temp:  97.4 F (36.3 C)    TempSrc:  Oral    Resp: 15 13 16 13   Height:      Weight:      SpO2: 91% 94% 90% 93%    Gen: Laying comfortably propped up on pillows. No apparent distress. Pleasant. Obese  HEENT: Atraumatic. Normocephalic. EOMI. No rhinorrhea.  Pulm: Normal WOB. CTAB. Slightly decreased breath sound bases. No crackles CV: RRR. No murmurs, rubs or gallops.  Abd: Obese. Soft. NT. ND. No masses. BS+.  Ext: Warm, well perfused. No rashes. No edema. Neuro: AOx3. No focal deficits.  Lyndee Hensen MD

## 2012-01-30 NOTE — Progress Notes (Signed)
01-30-12 UR completed.  Jezebelle Ledwell RN BSN 

## 2012-01-30 NOTE — Progress Notes (Signed)
Subjective: Did well overnight, POD#1 from  Endoscopic transsphenoid repair of left lateral Sternberg's canal encephalocele. No clear rhinorrhea, resting comfortably. Lumbar drain at 40mL/hr. Awake, alert communicative.  Objective: Vital signs in last 24 hours: Temp:  [97.6 F (36.4 C)-98.2 F (36.8 C)] 98.1 F (36.7 C) (02/22 0400) Pulse Rate:  [66-90] 71  (02/22 0600) Resp:  [11-20] 13  (02/22 0600) BP: (105-159)/(35-105) 115/53 mmHg (02/22 0600) SpO2:  [91 %-98 %] 96 % (02/22 0600) Arterial Line BP: (113-188)/(50-81) 117/70 mmHg (02/22 0600)  Awake, alert, voice somewhat raspy but strong with no stridor or stertor. Left Merocele nasal pack in place, no CSF rhinorrhea. No epistaxis. Moves all four extremities, EOMI, PERRLA, vision intact OU.  @LABLAST2 (wbc:2,hgb:2,hct:2,plt:2)  Basename 01/30/12 0530 01/29/12 1350 01/28/12 0620  NA 136 -- 139  K 3.5 -- 3.6  CL 97 -- 98  CO2 31 -- 29  GLUCOSE 245* 212* --  BUN 13 -- 24*  CREATININE 1.39* -- 1.80*  CALCIUM 8.0* -- 8.6    Medications:  Scheduled Meds:   . amLODipine  10 mg Oral Daily  .  ceFAZolin (ANCEF) IV  1 g Intravenous Once  . cefTRIAXone (ROCEPHIN)  IV  2 g Intravenous Q24H  . fluorescein-benoxinate  1 drop Both Eyes To OR  . heparin subcutaneous  5,000 Units Subcutaneous Q8H  . insulin aspart  0-20 Units Subcutaneous TID WC  . insulin aspart  0-5 Units Subcutaneous QHS  . insulin aspart  6 Units Subcutaneous TID WC  . insulin glargine  30 Units Subcutaneous QHS  . mupirocin ointment  1 application Nasal TID  . mupirocin ointment   Topical To OR  . potassium chloride  40 mEq Oral Once  . DISCONTD: hydrochlorothiazide  25 mg Oral Daily  . DISCONTD: potassium chloride  40 mEq Oral Once  . DISCONTD: simvastatin  20 mg Oral q1800   Continuous Infusions:   . sodium chloride 50 mL/hr at 01/30/12 0600   PRN Meds:.acetaminophen, acetaminophen, dextrose, fentaNYL, hydrALAZINE, morphine injection, ondansetron (ZOFRAN)  IV, ondansetron, oxyCODONE-acetaminophen, traMADol, DISCONTD: fluorescein-benoxinate, DISCONTD: mupirocin nasal ointment, DISCONTD: oxymetazoline, DISCONTD: sodium chloride irrigation, DISCONTD: sodium chloride irrigation  Assessment/Plan: POD#1 for transsphenoid, transnasal endoscopic repair of left Sternberg's canal encephalocele. Doing well, advance diet as tolerated. Will replace potassium. Lumbar drain at 32mL/hr for 5 days. Nasal packing in place for 2 weeks. Rocephin while inpatient, abx while nasal packing in place.   LOS: 3 days   Ruby Cola 01/30/2012, 6:49 AM

## 2012-01-30 NOTE — Progress Notes (Signed)
Family Medicine Teaching Service Attending Note  I interviewed and examined patient [name] and reviewed their tests and x-rays.  I discussed with Dr. Vallarie Mare and reviewed their note for today.  I agree with their assessment and plan.     Additionally  Follow fluid status may need diuretics Optimize blood sugar coverage to hopefully lower risk of infections

## 2012-01-30 NOTE — Progress Notes (Signed)
[place] Progress Note Patient Name: [name] DOB: 1956-01-17 MRN: [id number]  Date of Service  01/30/2012   HPI/Events of Note   Pt with desat to 70's.  Appeared to have OSA.  Woke up by nurse, sats improved.  [place] O2 at 4L  eICU Interventions  ENT to dictate when it is safe from post-op standpoint to start empiric CPAP.   Consider calling PCCM for recommendations on CPAP while hospitalized.    Intervention Category Minor Interventions: Other:  [name] 01/30/2012, 11:38 PM

## 2012-01-31 LAB — GLUCOSE, CAPILLARY
Glucose-Capillary: 249 mg/dL — ABNORMAL HIGH (ref 70–99)
Glucose-Capillary: 222 mg/dL — ABNORMAL HIGH (ref 70–99)
Glucose-Capillary: 177 mg/dL — ABNORMAL HIGH (ref 70–99)
Glucose-Capillary: 157 mg/dL — ABNORMAL HIGH (ref 70–99)
Glucose-Capillary: 173 mg/dL — ABNORMAL HIGH (ref 70–99)

## 2012-01-31 MED ORDER — INSULIN GLARGINE 100 UNIT/ML ~~LOC~~ SOLN
45.0000 [IU] | Freq: Every day | SUBCUTANEOUS | Status: DC
  Administered 2012-01-31 – 2012-02-02 (×3): 45 [IU] via SUBCUTANEOUS

## 2012-01-31 MED ORDER — HYDROCHLOROTHIAZIDE 25 MG PO TABS
25.0000 mg | ORAL_TABLET | Freq: Every day | ORAL | Status: DC
  Administered 2012-01-31 – 2012-02-04 (×5): 25 mg via ORAL
  Filled 2012-01-31 (×5): qty 1

## 2012-01-31 MED ORDER — LOSARTAN POTASSIUM 25 MG PO TABS
25.0000 mg | ORAL_TABLET | Freq: Every day | ORAL | Status: DC
  Administered 2012-01-31 – 2012-02-01 (×2): 25 mg via ORAL
  Filled 2012-01-31 (×3): qty 1

## 2012-01-31 NOTE — Progress Notes (Signed)
   ENT Progress Note: POD #2 s/p Procedure(s): SPHENOIDECTOMY ENDOSCOPIC SINUS SURGERY WITH STEALTH PLACEMENT OF LUMBAR DRAIN   Subjective: C/O headache and fatigue  Objective: Vital signs in last 24 hours: Temp:  [97.3 F (36.3 C)-98.9 F (37.2 C)] 98.9 F (37.2 C) (02/23 0400) Pulse Rate:  [67-101] 85  (02/23 1000) Resp:  [12-22] 15  (02/23 1000) BP: (99-163)/(50-85) 133/61 mmHg (02/23 1000) SpO2:  [81 %-94 %] 92 % (02/23 1000) FiO2 (%):  [40 %-50 %] 50 % (02/23 1000) Weight change:  Last BM Date: 01/26/12  Intake/Output from previous day: 02/22 0701 - 02/23 0700 In: [place] [P.O.:540; I.V.:1150; IV Piggyback:54] Out: [id number] [Urine:1320; Drains:420] Intake/Output this shift: Total I/O In: 100 [I.V.:100] Out: 192 [Urine:150; Drains:42]  Labs:  Basename 01/30/12 0530  WBC 11.7*  HGB 12.8  HCT 40.1  PLT 163    Basename 01/30/12 0530 01/29/12 1350  NA 136 --  K 3.5 --  CL 97 --  CO2 31 --  GLUCOSE 245* 212*  BUN 13 --  CALCIUM 8.0* --    Studies/Results: Dg Chest Port 1 View  01/30/2012  *RADIOLOGY REPORT*  Clinical Data: Respiratory failure  PORTABLE CHEST - 1 VIEW  Comparison: 01/29/2012  Findings: Right internal jugular central line is unchanged with its tip at the SVC/RA junction.  Venous hypertension and mild interstitial edema persist.  I think there is more volume loss in both lower lobes compared to yesterday.  No discernible effusion.  IMPRESSION: Worsening volume loss in the lower lobes.  Continued suspicion of fluid overload/mild edema  Original Report Authenticated By: Jules Schick, M.D.   Dg Chest Port 1 View  01/29/2012  *RADIOLOGY REPORT*  Clinical Data: Respiratory failure.  PORTABLE CHEST - 1 VIEW  Comparison: 11/07/2011  Findings: There is cardiomegaly.  Vascular congestion and mild interstitial prominence.  Question early interstitial edema.  No effusions or acute bony abnormality.  Right central line tip is at the cavoatrial junction.  No  pneumothorax.  IMPRESSION: Cardiomegaly, vascular congestion.  Question mild interstitial edema.  Original Report Authenticated By: Raelyn Number, M.D.     PHYSICAL EXAM: Packing inplace, no bleeding or leakage   Assessment/Plan: Pt stable LP functioning -  Drain per NS Cont current care    [name] 01/31/2012, 10:18 AM

## 2012-01-31 NOTE — Progress Notes (Signed)
Family Medicine Teaching Service Attending Note  I discussed patient [name]  with Dr. Vallarie Mare and reviewed their note for today.  I agree with their assessment and plan.     If her respiratory status (pulse ox) does not improve off IVF then would check chest xray and give diuretics if shows vascular congestion.  Would recommend Incentive Spirometry if ok with ENT

## 2012-01-31 NOTE — Progress Notes (Signed)
Patient ID: [name], female   DOB: Jan 23, 1956, 56 y.o.   MRN: [id number] Subjective: Patient reports nausea and mild headache, but feels headache may be better.  Objective: Vital signs in last 24 hours: Temp:  [97.3 F (36.3 C)-98.9 F (37.2 C)] 98.9 F (37.2 C) (02/23 0400) Pulse Rate:  [67-101] 90  (02/23 0700) Resp:  [12-22] 17  (02/23 0600) BP: (99-163)/(51-123) 150/71 mmHg (02/23 0700) SpO2:  [81 %-94 %] 93 % (02/23 0700)  Intake/Output from previous day: 02/22 0701 - 02/23 0700 In: [place] [P.O.:540; I.V.:1150; IV Piggyback:54] Out: [id number] [Urine:1320; Drains:420] Intake/Output this shift: Total I/O In: 50 [I.V.:50] Out: 13 [Drains:13]  Neurologic: Grossly normal  Lab Results: Lab Results  Component Value Date   WBC 11.7* 01/30/2012   HGB 12.8 01/30/2012   HCT 40.1 01/30/2012   MCV 95.2 01/30/2012   PLT 163 01/30/2012   No results found for this basename: INR, PROTIME   BMET Lab Results  Component Value Date   NA 136 01/30/2012   K 3.5 01/30/2012   CL 97 01/30/2012   CO2 31 01/30/2012   GLUCOSE 245* 01/30/2012   BUN 13 01/30/2012   CREATININE 1.39* 01/30/2012   CALCIUM 8.0* 01/30/2012    Studies/Results: Dg Chest Port 1 View  01/30/2012  *RADIOLOGY REPORT*  Clinical Data: Respiratory failure  PORTABLE CHEST - 1 VIEW  Comparison: 01/29/2012  Findings: Right internal jugular central line is unchanged with its tip at the SVC/RA junction.  Venous hypertension and mild interstitial edema persist.  I think there is more volume loss in both lower lobes compared to yesterday.  No discernible effusion.  IMPRESSION: Worsening volume loss in the lower lobes.  Continued suspicion of fluid overload/mild edema  Original Report Authenticated By: Jules Schick, M.D.   Dg Chest Port 1 View  01/29/2012  *RADIOLOGY REPORT*  Clinical Data: Respiratory failure.  PORTABLE CHEST - 1 VIEW  Comparison: 11/07/2011  Findings: There is cardiomegaly.  Vascular congestion and mild interstitial  prominence.  Question early interstitial edema.  No effusions or acute bony abnormality.  Right central line tip is at the cavoatrial junction.  No pneumothorax.  IMPRESSION: Cardiomegaly, vascular congestion.  Question mild interstitial edema.  Original Report Authenticated By: Raelyn Number, M.D.    Assessment/Plan: Continue lumbar drain to drain 10-15 cc an hour per Dr. Renold Genta plan.   LOS: 4 days    Jolonda Gomm S 01/31/2012, 8:59 AM

## 2012-01-31 NOTE — Progress Notes (Signed)
Subjective: O/N had desat to 70% (likely sleep apnea from obesity) CCM managed o/n. No other issues. Patient has no complaints this morning.   Objective: Vital signs in last 24 hours: Temp:  [97.3 F (36.3 C)-98.9 F (37.2 C)] 98.9 F (37.2 C) (02/23 0400) Pulse Rate:  [67-101] 90  (02/23 0700) Resp:  [12-22] 17  (02/23 0600) BP: (99-163)/(51-123) 150/71 mmHg (02/23 0700) SpO2:  [81 %-94 %] 93 % (02/23 0700) Weight change:  Last BM Date: 01/26/12  Intake/Output from previous day: 02/22 0701 - 02/23 0700 In: 1774 [P.O.:540; I.V.:1150; IV Piggyback:54] Out: 1728 [Urine:1320; Drains:408] Intake/Output this shift:    Physical Exam  Gen: Laying comfortably propped up on pillows. No apparent distress. Pleasant. Obese  HEENT: Atraumatic. Normocephalic. EOMI. No rhinorrhea.  Pulm: Normal WOB. CTAB. Slightly decreased breath sound bases. No crackles CV: RRR. No murmurs, rubs or gallops.  Abd: Obese. Soft. NT. ND. No masses. BS+.  Ext: Warm, well perfused. No rashes. No edema. Neuro: AOx3. No focal deficits. Back: lumbar drain in place. Lab Results:  Basename 01/30/12 0530  WBC 11.7*  HGB 12.8  HCT 40.1  PLT 163   BMET  Basename 01/30/12 0530 01/29/12 1350  NA 136 --  K 3.5 --  CL 97 --  CO2 31 --  GLUCOSE 245* 212*  BUN 13 --  CREATININE 1.39* --  CALCIUM 8.0* --    Studies/Results: Dg Chest Port 1 View  01/30/2012  *RADIOLOGY REPORT*  Clinical Data: Respiratory failure  PORTABLE CHEST - 1 VIEW  Comparison: 01/29/2012  Findings: Right internal jugular central line is unchanged with its tip at the SVC/RA junction.  Venous hypertension and mild interstitial edema persist.  I think there is more volume loss in both lower lobes compared to yesterday.  No discernible effusion.  IMPRESSION: Worsening volume loss in the lower lobes.  Continued suspicion of fluid overload/mild edema  Original Report Authenticated By: Jules Schick, M.D.   Dg Chest Port 1 View  01/29/2012   *RADIOLOGY REPORT*  Clinical Data: Respiratory failure.  PORTABLE CHEST - 1 VIEW  Comparison: 11/07/2011  Findings: There is cardiomegaly.  Vascular congestion and mild interstitial prominence.  Question early interstitial edema.  No effusions or acute bony abnormality.  Right central line tip is at the cavoatrial junction.  No pneumothorax.  IMPRESSION: Cardiomegaly, vascular congestion.  Question mild interstitial edema.  Original Report Authenticated By: Raelyn Number, M.D.    Medications: I have reviewed the patient's current medications.  Assessment/Plan: 56 yo F with hyperosmolar hyperglycemic state and acute renal failure admitted for control prior to planned surgery on 2/21 by ENT for CSF rhinorrhea. Now POD #2 s/p surgery .  1: DM T2: Previously borderline controlled with A1c 8.4 on Metformin and Amaryl but stopped treatment 2 months ago. - CBG 600 on admission. Placed on glucomander and hydrated. Glucose normalized.  - Stopped metformin with elevated Cr.  - Placed on Lantus 30 daily and SSI. Glucoses over 200. Will titrate up insulin regimen for tighter control. Will also plan on patient going home with an insulin regimen.  - will increase Lantus to 45 today from 30 CBG (last 3)   Basename 01/30/12 2150 01/30/12 1727 01/30/12 0754  GLUCAP 244* 248* 242*    2. Acute on chronic kidney failure: Creatinine baseline of 1.33. 2.22 on admission, likely due to osmotic diuresis and dehydration.  - Hydrated and Cr decreased to 1.80 within 24 hours. Yesterday 1.39 almost baseline. Will stop IV fluids.  3. Pulmonary Edema: - Now with some pulmonary edema on CXR. No crackles or LE edema. Stopped IV fluids. - Obese, with reported history of shortness of breadth, sleeps with 2 pillows, wakes up gasping for air.  BNP    Component Value Date/Time   PROBNP 775.8* 01/30/2012 1230   4. Hyponatremia. On admission now resolved. - Normalized after hydration   5. Hypertension: BP elevated mildly on  admission.  - Home HCTZ and norvasc. Will restart ARB   6. Hyperlipidemia  - On Pravastatin. Will continue.   7. CSF Rhinorrhea  - Will follow surgery recommendations. Plan for lumbar drain at 10 ml/hr for 5 days and nasal packing for 2 weeks. On Rocephin while inpatient than oral antibiotics until nasal packing removed. - No rhinorrhea, headache or deficits post surgery. - Pain control  8. FEN/GI: Advance diet as tolerated. Carb modified. 9. PPx: Heparin. 10. Dispo.  Per surgery Full code.   LOS: 4 days   Lashanda Storlie MD 7:32 AM 55/23/2013

## 2012-02-01 ENCOUNTER — Inpatient Hospital Stay (HOSPITAL_COMMUNITY): Payer: Self-pay

## 2012-02-01 LAB — BASIC METABOLIC PANEL
BUN: 8 mg/dL (ref 6–23)
CO2: 34 mEq/L — ABNORMAL HIGH (ref 19–32)
Calcium: 8.5 mg/dL (ref 8.4–10.5)
Chloride: 100 mEq/L (ref 96–112)
Creatinine, Ser: 1.01 mg/dL (ref 0.50–1.10)
GFR calc Af Amer: 72 mL/min — ABNORMAL LOW (ref >90)
GFR calc non Af Amer: 62 mL/min — ABNORMAL LOW (ref >90)
Glucose, Bld: 173 mg/dL — ABNORMAL HIGH (ref 70–99)
Potassium: 3.8 mEq/L (ref 3.5–5.1)
Sodium: 141 mEq/L (ref 135–145)

## 2012-02-01 LAB — GLUCOSE, CAPILLARY
Glucose-Capillary: 161 mg/dL — ABNORMAL HIGH (ref 70–99)
Glucose-Capillary: 137 mg/dL — ABNORMAL HIGH (ref 70–99)
Glucose-Capillary: 166 mg/dL — ABNORMAL HIGH (ref 70–99)
Glucose-Capillary: 160 mg/dL — ABNORMAL HIGH (ref 70–99)

## 2012-02-01 MED ORDER — FUROSEMIDE 10 MG/ML IJ SOLN
40.0000 mg | Freq: Every day | INTRAMUSCULAR | Status: DC
  Administered 2012-02-01 – 2012-02-02 (×2): 40 mg via INTRAVENOUS
  Filled 2012-02-01 (×3): qty 4

## 2012-02-01 NOTE — Progress Notes (Signed)
Family Medicine Teaching Service Attending Note  I discussed patient [place]  with Dr. Mingo Amber and reviewed their note for today.  I agree with their assessment and plan.     Would start on diuretics and follow urine output, creatitnine and chest xray

## 2012-02-01 NOTE — Progress Notes (Signed)
Subjective: O/N had desats to 88% this AM.  Sleeping when I saw her.  Venti mask on, but not correct positioning.  Complains of continued nausea.    Objective: Vital signs in last 24 hours: Temp:  [97.5 F (36.4 C)-98.7 F (37.1 C)] 97.8 F (36.6 C) (02/24 0800) Pulse Rate:  [67-84] 80  (02/24 1000) Resp:  [13-19] 17  (02/24 1000) BP: (111-164)/(50-84) 158/84 mmHg (02/24 1000) SpO2:  [87 %-96 %] 92 % (02/24 1000) FiO2 (%):  [35 %-50 %] 35 % (02/24 1000) Weight change:  Last BM Date: 01/26/12  Intake/Output from previous day: 02/23 0701 - 02/24 0700 In: 1259 [P.O.:30; I.V.:1175; IV Piggyback:54] Out: 2039 [Urine:1725; Emesis/NG output:10; Drains:304] Intake/Output this shift: Total I/O In: 150 [I.V.:150] Out: 143 [Urine:90; Drains:53]  Physical Exam  Gen: Sleepy but arousable.   HEENT: Atraumatic. Normocephalic. EOMI. No rhinorrhea.  Pulm: Normal WOB. CTAB. Slightly decreased breath sound bases.  Minimal crackles CV: RRR. No murmurs, rubs or gallops.  Abd: Obese. Soft. NT. ND. No masses. BS+.  Ext: Warm, well perfused. No rashes. No edema. Neuro: AOx3. No focal deficits. Back: lumbar drain in place.  Lab Results:  Basename 01/30/12 0530  WBC 11.7*  HGB 12.8  HCT 40.1  PLT 163   BMET  Basename 02/01/12 0600 01/30/12 0530  NA 141 136  K 3.8 3.5  CL 100 97  CO2 34* 31  GLUCOSE 173* 245*  BUN 8 13  CREATININE 1.01 1.39*  CALCIUM 8.5 8.0*    Studies/Results: No results found.  Medications: I have reviewed the patient's current medications.  Assessment/Plan: 56 yo F with hyperosmolar hyperglycemic state and acute renal failure admitted for control prior to planned surgery on 2/21 by ENT for CSF rhinorrhea. Now POD #2 s/p surgery .  1: DM T2: Previously borderline controlled with A1c 8.4 on Metformin and Amaryl but stopped treatment 2 months ago. - CBG 600 on admission. Placed on glucomander and hydrated. Glucose normalized.  - Stopped metformin with  elevated Cr.  - Placed on Lantus 45 daily and SSI. Glucoses over 200. Will titrate up insulin regimen for tighter control. Will also plan on patient going home with an insulin regimen.  CBGs somewhat better on increased insulin dose.   CBG (last 3)   Basename 02/01/12 0742 01/31/12 2220 01/31/12 1731  GLUCAP 161* 173* 157*    2. Acute on chronic kidney failure: Creatinine baseline of 1.33. 2.22 on admission, likely due to osmotic diuresis and dehydration.  - Hydrated and Cr decreased to 1.80 within 24 hours. Yesterday 1.39 almost baseline. Will stop IV fluids. - At 1.01 today.  Lasix for edema, see below.   3. Pulmonary Edema: - Now with some pulmonary edema on CXR. Still receiving 50 cc IV daily - Obese, with reported history of shortness of breadth, sleeps with 2 pillows, wakes up gasping for air.  BNP    Component Value Date/Time   PROBNP 775.8* 01/30/2012 1230  40 mg IV lasix daily.  Repeat portable CXR in AM tomorrow.  May need to uptitrate Lasix, favor decreasing IV fluids if we can control nausea and she can restart eating.  5.  Nausea:  Persists.  Zofran scheduled Q6.  May improve also with decreased pain medication.  Continue to encourage PO intake.    5. Hyponatremia. On admission now resolved. - Normalized after hydration   6. Hypertension: BP elevated mildly on admission.  - Home HCTZ and norvasc. Will restart ARB   7. Hyperlipidemia  -  On Pravastatin. Will continue.   8. CSF Rhinorrhea  - Will follow surgery recommendations. Plan for lumbar drain at 10 ml/hr for 5 days and nasal packing for 2 weeks. On Rocephin while inpatient than oral antibiotics until nasal packing removed. - No rhinorrhea, headache or deficits post surgery. - Pain control  8. FEN/GI: Advance diet as tolerated. Carb modified. 9. PPx: Heparin. 10. Dispo.  Per surgery Full code.   LOS: 5 days   Kiyan Burmester,JEFF MD 11:00 AM 02/01/2012

## 2012-02-01 NOTE — Progress Notes (Signed)
   ENT Progress Note: POD #3 s/p Procedure(s): SPHENOIDECTOMY ENDOSCOPIC SINUS SURGERY WITH STEALTH PLACEMENT OF LUMBAR DRAIN   Subjective: C/O nausea, tol limited po  Objective: Vital signs in last 24 hours: Temp:  [97.5 F (36.4 C)-98.7 F (37.1 C)] 98.7 F (37.1 C) (02/24 0400) Pulse Rate:  [67-89] 78  (02/24 0800) Resp:  [13-19] 15  (02/24 0800) BP: (111-164)/(50-80) 156/78 mmHg (02/24 0800) SpO2:  [87 %-96 %] 90 % (02/24 0800) FiO2 (%):  [35 %-50 %] 35 % (02/24 0800) Weight change:  Last BM Date: 01/26/12  Intake/Output from previous day: 02/23 0701 - 02/24 0700 In: 1259 [P.O.:30; I.V.:1175; IV Piggyback:54] Out: 2039 [Urine:1725; Emesis/NG output:10; Drains:304] Intake/Output this shift: Total I/O In: 50 [I.V.:50] Out: 20 [Drains:20]  Labs:  [facility] 01/30/12 0530  WBC 11.7*  HGB 12.8  HCT 40.1  PLT 163    Basename 02/01/12 0600 01/30/12 0530  NA 141 136  K 3.8 3.5  CL 100 97  CO2 34* 31  GLUCOSE 173* 245*  BUN 8 13  CALCIUM 8.5 8.0*    Studies/Results: No results found.   PHYSICAL EXAM: A & O  Packing inplace, no sig d/c   Assessment/Plan: Cont. current care Decrease pain meds as tol, cont zofran   [name] 02/01/2012, 8:42 AM

## 2012-02-01 NOTE — Progress Notes (Signed)
Patient ID: LESLY BIRES, female   DOB: 1956/07/04, 56 y.o.   MRN: [id number] Subjective: Patient reports she is doing better. Less headache, no nausea.  Objective: Vital signs in last 24 hours: Temp:  [97.5 F (36.4 C)-98.7 F (37.1 C)] 98.7 F (37.1 C) (02/24 0400) Pulse Rate:  [67-90] 77  (02/24 0500) Resp:  [13-19] 13  (02/24 0500) BP: (111-154)/(50-80) 154/80 mmHg (02/24 0500) SpO2:  [87 %-96 %] 95 % (02/24 0500) FiO2 (%):  [40 %-50 %] 50 % (02/23 1000)  Intake/Output from previous day: 02/23 0701 - 02/24 0700 In: 646.5 [P.O.:30; I.V.:562.5; IV Piggyback:54] Out: 1421 [Urine:1150; Emesis/NG output:10; Drains:261] Intake/Output this shift: Total I/O In: -  Out: 344 [Urine:250; Drains:94]  Neurologic: Grossly normal  Lab Results: Lab Results  Component Value Date   WBC 11.7* 01/30/2012   HGB 12.8 01/30/2012   HCT 40.1 01/30/2012   MCV 95.2 01/30/2012   PLT 163 01/30/2012   No results found for this basename: INR, PROTIME   BMET Lab Results  Component Value Date   NA 136 01/30/2012   K 3.5 01/30/2012   CL 97 01/30/2012   CO2 31 01/30/2012   GLUCOSE 245* 01/30/2012   BUN 13 01/30/2012   CREATININE 1.39* 01/30/2012   CALCIUM 8.0* 01/30/2012    Studies/Results: Dg Chest Port 1 View  01/30/2012  *RADIOLOGY REPORT*  Clinical Data: Respiratory failure  PORTABLE CHEST - 1 VIEW  Comparison: 01/29/2012  Findings: Right internal jugular central line is unchanged with its tip at the SVC/RA junction.  Venous hypertension and mild interstitial edema persist.  I think there is more volume loss in both lower lobes compared to yesterday.  No discernible effusion.  IMPRESSION: Worsening volume loss in the lower lobes.  Continued suspicion of fluid overload/mild edema  Original Report Authenticated By: Jules Schick, M.D.    Assessment/Plan: doing better today. Lumbar drain draining about 11 cc per hour on average.   LOS: 5 days    Therese Rocco S 02/01/2012, 6:24 AM

## 2012-02-02 LAB — GLUCOSE, CAPILLARY
Glucose-Capillary: 121 mg/dL — ABNORMAL HIGH (ref 70–99)
Glucose-Capillary: 162 mg/dL — ABNORMAL HIGH (ref 70–99)
Glucose-Capillary: 187 mg/dL — ABNORMAL HIGH (ref 70–99)
Glucose-Capillary: 188 mg/dL — ABNORMAL HIGH (ref 70–99)

## 2012-02-02 MED ORDER — DOCUSATE SODIUM 100 MG PO CAPS
100.0000 mg | ORAL_CAPSULE | Freq: Two times a day (BID) | ORAL | Status: DC | PRN
  Filled 2012-02-02: qty 1

## 2012-02-02 NOTE — Progress Notes (Signed)
PGY-1 Daily Progress Note Family Medicine Teaching Service D. Piloto Philippa Sicks, MD Service Pager: [phone]  Patient name: [name]  Medical record [id number] Date of birth:14-Dec-1955 Age: 56 y.o. Gender: female  LOS: 6 days   Subjective: Feeling better still nauseated. Headache controlled. Last BM last Wednesday.  Objective:  Vitals: Temp:  98.6 F  (02/25 0700) Pulse Rate: 64  (02/25 0800) Resp:  14  (02/25 0800) BP: (107-158)/(44-84) 121/56 mmHg (02/25 0800) SpO2: 93 % (02/25 0800) FiO2 (%): 35 % (02/24 1000) Weight:  257 lb 4.4 oz (02/25 0400)   Intake/Output Summary (Last 24 hours) at 02/02/12 0848 Last data filed at 02/02/12 0800  Gross per 24 hour  Intake   1326 ml  Output   1675 ml  Net   -349 ml   Physical Exam: Gen:  NAD HEENT: Moist mucous membranes CV: Regular rate and rhythm, no murmurs rubs or gallops. PULM: Clear to auscultation bilaterally. Presence of rales bibasal. ABD: Soft, non tender, normal bowel sounds EXT: No edema Neuro: Alert and oriented x3. No focalization.  Spinal Drainage clear.  Labs and imaging:  CBC  Lab 01/30/12 0530 01/28/12 0620  WBC 11.7* 8.3  HGB 12.8 14.7  HCT 40.1 44.3  PLT 163 179   BMET  Lab 02/01/12 0600 01/30/12 0530 01/28/12 0620  NA 141 136 139  K 3.8 3.5 3.6  CL 100 97 98  CO2 34* 31 29  BUN 8 13 24*  CREATININE 1.01 1.39* 1.80*  LABGLOM -- -- --  GLUCOSE 173* -- --  CALCIUM 8.5 8.0* 8.6    Dg Chest Port 1 View  02/01/2012   IMPRESSION: Stable cardiomegaly without pulmonary edema.  Improved aeration in the lung bases and in the left upper lobe, with mild atelectasis persisting.  No new abnormalities.  Original Report Authenticated By: Deniece Portela, M.D.   Medications: Medication Dose Route Frequency  . 0.9 %  sodium chloride infusion   Intravenous Continuous  . acetaminophen (TYLENOL) tablet 650 mg  650 mg Oral Q6H PRN    . acetaminophen (TYLENOL) suppository 650 mg  650 mg Rectal Q6H  PRN  . amLODipine (NORVASC) tablet 10 mg  10 mg Oral Daily  . cefTRIAXone (ROCEPHIN) 2 g in dextrose 5 % 50 mL IVPB  2 g Intravenous Q24H  . dextrose 50 % solution 25 mL  25 mL Intravenous PRN  . fentaNYL (SUBLIMAZE) injection 25-50 mcg  25-50 mcg Intravenous Q2H PRN  . furosemide (LASIX) injection 40 mg  40 mg Intravenous Daily  . heparin injection 5,000 Units  5,000 Units Subcutaneous Q8H  . hydrALAZINE (APRESOLINE) injection 10 mg  10 mg Intravenous Q4H PRN  . hydrochlorothiazide (HYDRODIURIL) tablet 25 mg  25 mg Oral Daily  . insulin aspart (novoLOG) injection 0-20 Units  0-20 Units Subcutaneous TID WC  . insulin aspart (novoLOG) injection 0-5 Units  0-5 Units Subcutaneous QHS  . insulin aspart (novoLOG) injection 6 Units  6 Units Subcutaneous TID WC  . insulin glargine (LANTUS) injection 45 Units  45 Units Subcutaneous QHS  . losartan (COZAAR) tablet 25 mg  25 mg Oral Daily  . morphine 2 MG/ML injection 1 mg  1 mg Intravenous Q4H PRN  . mupirocin ointment (BACTROBAN) 2 % 1 application  1 application Nasal TID  . ondansetron (ZOFRAN) tablet 4 mg  4 mg Oral Q6H PRN    . ondansetron (ZOFRAN) injection 4 mg  4 mg Intravenous Q6H PRN  . oxyCODONE-acetaminophen (PERCOCET)  5-325 MG per tablet 1-2 tablet  1-2 tablet Oral Q4H PRN  . traMADol (ULTRAM) tablet 50-100 mg  50-100 mg Oral Q6H PRN   Assessment and Plan: 56 yo F with hyperosmolar hyperglycemic state and acute renal failure admitted for control prior to planned surgery on 2/21 by ENT for CSF rhinorrhea. Now POD #3 s/p surgery .   1: DM T2: Previously borderline controlled with A1c 8.4 on Metformin and Amaryl but stopped treatment 2 months ago. - CBG 600 on admission. Placed on glucomander and hydrated. Glucose normalized.  - Placed on Lantus 45 daily and SSI 11 units in last 24 h. Glucoses mid to low 100's . Continue same regimen. Pt will probably go home on insuline.  CBG (last 3)   Basename 02/02/12 0746 02/01/12 2214 02/01/12  1646  GLUCAP 121* 160* 166*   2. Acute on chronic kidney failure: Creatinine baseline of 1.33. 2.22 on admission, likely due to osmotic diuresis and dehydration. Hydrated and Cr decreased to 1.80 within 24 hours. Yesterday 1.01.  -Continue to monitor.   3. Pulmonary Edema: BNP 775.8 . Last ECHO EF 55-60% in July/2011. CXR yesterday with cardiomegaly but no Pulmonary Edema, with mild atelectasias. Normal WOB, some rales present we think die to atelectasias. - stop furosemide. - incentive spirometry  - will consider ECHO.  5. Nausea: Persists. Zofran scheduled Q6.   6. Hypertension: BP restarted yesterday home losartan. BP low, will discontinue losartan. - Continue on HCTZ and amlodipine. Will monitor closely.  7. Hyperlipidemia  - On Pravastatin. Will continue.   8. CSF Rhinorrhea  POD#4 from left endoscopic repair of sphenoid encephalocele/CSF leak. If doing well can likely clamp then D/C lumbar drain POD#5. Will leave nasal packing in place x 2 weeks post-op to allow graft to heal. Rocephin IV while inpatient.  8. FEN/GI: Advance diet as tolerated. Carb modified.  9. PPx: Heparin.  10. Dispo. Per surgery  D. Piloto Philippa Sicks, MD PGY1, [facility] Medicine Teaching Service Pager [phone] 02/02/2012

## 2012-02-02 NOTE — Progress Notes (Addendum)
Subjective: POD#4 from endoscopic repair of left sphenoid/Sterberg's canal encephalocele. Creatinine and blood glucose levels improving. Still desat's periodically when sleeping into the 80's. Resting comfortably this am.  Objective: Vital signs in last 24 hours: Temp:  [97.4 F (36.3 C)-98.6 F (37 C)] 97.4 F (36.3 C) (02/25 0400) Pulse Rate:  [63-80] 63  (02/25 0600) Resp:  [14-19] 14  (02/25 0600) BP: (107-164)/(44-84) 113/51 mmHg (02/25 0600) SpO2:  [90 %-97 %] 94 % (02/25 0600) FiO2 (%):  [35 %] 35 % (02/24 1000) Weight:  [116.7 kg (257 lb 4.4 oz)] 116.7 kg (257 lb 4.4 oz) (02/25 0400)  PE: sleeping, desat's briefly into 80's on nasal cannula while snoring. Left nasal packing in place, no CSF rhinorrhea, no epistaxis.  @LABLAST2 (wbc:2,hgb:2,hct:2,plt:2)  Basename 02/01/12 0600  NA 141  K 3.8  CL 100  CO2 34*  GLUCOSE 173*  BUN 8  CREATININE 1.01  CALCIUM 8.5    Medications:  Scheduled Meds:   . amLODipine  10 mg Oral Daily  . cefTRIAXone (ROCEPHIN)  IV  2 g Intravenous Q24H  . furosemide  40 mg Intravenous Daily  . heparin subcutaneous  5,000 Units Subcutaneous Q8H  . hydrochlorothiazide  25 mg Oral Daily  . insulin aspart  0-20 Units Subcutaneous TID WC  . insulin aspart  0-5 Units Subcutaneous QHS  . insulin aspart  6 Units Subcutaneous TID WC  . insulin glargine  45 Units Subcutaneous QHS  . losartan  25 mg Oral Daily  . mupirocin ointment  1 application Nasal TID   Continuous Infusions:   . sodium chloride 50 mL/hr at 02/02/12 0600   PRN Meds:.acetaminophen, acetaminophen, dextrose, fentaNYL, hydrALAZINE, morphine injection, ondansetron (ZOFRAN) IV, ondansetron, oxyCODONE-acetaminophen, traMADol  Assessment/Plan: POD#4 from left endoscopic repair of sphenoid encephalocele/CSF leak. Stable, creatinine and blood glucose significantly improved, appreciate Family Medicine help with comorbidities. If doing well can likely clamp then D/C lumbar drain POD#5.  Will leave nasal packing in place x 2 weeks post-op to allow graft to heal. Continue Heparin [place] for DVT prophylaxis. Rocephin IV while inpatient.   LOS: 6 days   Ruby Cola 02/02/2012, 6:29 AM  Addendum: given desats into 70's-80's and inability to use CPAP until nasal packing removed could consider monitoring as inpatient until nasal packing removed POD#14 and able to restart CPAP at home. Would also allow tighter glucose control while sphenoid graft is healing.  Spoke with patient, she was not using CPAP for her OSA pre-op.

## 2012-02-02 NOTE — Progress Notes (Signed)
I have seen and examined this patient. I have discussed with Dr Thomes Dinning.  I agree with their findings and plans as documented in their progress note for today.  Acute Issues  1. POD#3 left ethmoidectomy, maxillary antrostomy, sphenoidectomy, tissue (mucosa) graft to left lateral sphenoid defect.-- Stable per surgery 2. Pulmonary Edema.  Preserved Ejection Fraction 07/11 Echocardiogram-- Improved. 3. Obstructive Sleep Apnea- Patient briefly desat to high 88% (< 10 sec) on [place] oxygen with snoring and gasp in sleep per my bedside observation.  Patient wears CPAP at home. Pt unable to wear CPAP currently secondary to nasal packing.    Will see if titration of supplemental oxygen via [place] can mitigate transient obstructive apnea events with sleep. Currently at 5 L/min.

## 2012-02-02 NOTE — Progress Notes (Signed)
Doing well. No nasal drainage, min HA  No Nausea /vomiting   Temp:  [97.4 F (36.3 C)-98.6 F (37 C)] 97.7 F (36.5 C) (02/25 0700) Pulse Rate:  [63-79] 69  (02/25 1000) Resp:  [13-19] 13  (02/25 1000) BP: (107-148)/(44-79) 133/58 mmHg (02/25 1000) SpO2:  [93 %-97 %] 97 % (02/25 1000) Weight:  [116.7 kg (257 lb 4.4 oz)] 116.7 kg (257 lb 4.4 oz) (02/25 0400) Lumbar drain 313 cc last 24   Neuro - intact  Plan: CPM

## 2012-02-03 LAB — GLUCOSE, CAPILLARY
Glucose-Capillary: 150 mg/dL — ABNORMAL HIGH (ref 70–99)
Glucose-Capillary: 261 mg/dL — ABNORMAL HIGH (ref 70–99)
Glucose-Capillary: 166 mg/dL — ABNORMAL HIGH (ref 70–99)
Glucose-Capillary: 153 mg/dL — ABNORMAL HIGH (ref 70–99)

## 2012-02-03 MED ORDER — INSULIN GLARGINE 100 UNIT/ML ~~LOC~~ SOLN
35.0000 [IU] | Freq: Every day | SUBCUTANEOUS | Status: DC
  Administered 2012-02-03 – 2012-02-05 (×3): 35 [IU] via SUBCUTANEOUS
  Filled 2012-02-03: qty 3

## 2012-02-03 MED ORDER — COLCHICINE 0.6 MG PO TABS
0.6000 mg | ORAL_TABLET | Freq: Once | ORAL | Status: AC
  Administered 2012-02-03: 0.6 mg via ORAL
  Filled 2012-02-03 (×2): qty 1

## 2012-02-03 MED ORDER — COLCHICINE 0.6 MG PO TABS
1.2000 mg | ORAL_TABLET | Freq: Once | ORAL | Status: AC
  Administered 2012-02-03: 1.2 mg via ORAL
  Filled 2012-02-03 (×2): qty 2

## 2012-02-03 MED ORDER — METFORMIN HCL 500 MG PO TABS
500.0000 mg | ORAL_TABLET | Freq: Two times a day (BID) | ORAL | Status: DC
  Administered 2012-02-03 – 2012-02-04 (×3): 500 mg via ORAL
  Filled 2012-02-03 (×6): qty 1

## 2012-02-03 NOTE — Progress Notes (Signed)
Doing well.  No nasal drainage  Temp:  [97.4 F (36.3 C)-98.8 F (37.1 C)] 97.5 F (36.4 C) (02/26 0800) Pulse Rate:  [61-78] 69  (02/26 1000) Resp:  [13-18] 16  (02/26 1000) BP: (99-158)/(43-85) 123/71 mmHg (02/26 1000) SpO2:  [92 %-96 %] 94 % (02/26 1000) Weight:  [116.4 kg (256 lb 9.9 oz)] 116.4 kg (256 lb 9.9 oz) (02/26 0400) Neuro stable  Lumbar drain removed Plan: Ok to transfer to floor - keep HOB 20-30 degrees for 8 hours - then up ad lib

## 2012-02-03 NOTE — Progress Notes (Signed)
PGY-1 Daily Progress Note Family Medicine Teaching Service D. Piloto Philippa Sicks, MD Service Pager: [phone]  Patient name: [name]  Medical record [id number] Date of birth:Dec 05, 1956 Age: 56 y.o. Gender: female  LOS: 7 days   Subjective:  Feeling better. No difficulty breathing. Objective:  Vitals: Temp:  [97.4 F (36.3 C)-98.8 F (37.1 C)] 97.5 F (36.4 C) (02/26 0800) Pulse Rate:  [61-78] 69  (02/26 1000) Resp:  [13-18] 16  (02/26 1000) BP: (99-158)/(43-85) 123/71 mmHg (02/26 1000) SpO2:  [92 %-96 %] 94 % (02/26 1000) Weight:  [256 lb 9.9 oz (116.4 kg)] 256 lb 9.9 oz (116.4 kg) (02/26 0400)  Intake/Output Summary (Last 24 hours) at 02/03/12 1043 Last data filed at 02/03/12 1000  Gross per 24 hour  Intake   1250 ml  Output   1968 ml  Net   -718 ml   Physical Exam: Gen: NAD  HEENT: Moist mucous membranes  CV: Regular rate and rhythm, no murmurs rubs or gallops.  PULM: Clear to auscultation bilaterally. Presence of minimal rales bibasal. Improved from yesterday.  ABD: Soft, non tender, normal bowel sounds  EXT: No edema  Neuro: Alert and oriented x3. No focalization. Spinal Drainage removed.  Labs and imaging:   CBG (last 3)   Basename 02/03/12 0747 02/02/12 2157 02/02/12 1650  GLUCAP 150* 188* 187*   Medications: Medication Dose Route Frequency  . 0.9 %  sodium chloride infusion   Intravenous Continuous  . acetaminophen (TYLENOL) tablet 650 mg  650 mg Oral Q6H PRN    . acetaminophen (TYLENOL) suppository 650 mg  650 mg Rectal Q6H PRN  . amLODipine (NORVASC) tablet 10 mg  10 mg Oral Daily  . cefTRIAXone (ROCEPHIN) 2 g in dextrose 5 % 50 mL IVPB  2 g Intravenous Q24H  . dextrose 50 % solution 25 mL  25 mL Intravenous PRN  . docusate sodium (COLACE) capsule 100 mg  100 mg Oral BID PRN  . fentaNYL (SUBLIMAZE) injection 25-50 mcg  25-50 mcg Intravenous Q2H PRN  . heparin injection 5,000 Units  5,000 Units Subcutaneous Q8H  . hydrALAZINE (APRESOLINE)  injection 10 mg  10 mg Intravenous Q4H PRN  . hydrochlorothiazide (HYDRODIURIL) tablet 25 mg  25 mg Oral Daily  . insulin aspart (novoLOG) injection 0-20 Units  0-20 Units Subcutaneous TID WC  . insulin aspart (novoLOG) injection 0-5 Units  0-5 Units Subcutaneous QHS  . insulin aspart (novoLOG) injection 6 Units  6 Units Subcutaneous TID WC  . insulin glargine (LANTUS) injection 45 Units  45 Units Subcutaneous QHS  . morphine 2 MG/ML injection 1 mg  1 mg Intravenous Q4H PRN  . mupirocin ointment (BACTROBAN) 2 % 1 application  1 application Nasal TID  . ondansetron (ZOFRAN) tablet 4 mg  4 mg Oral Q6H PRN    . ondansetron (ZOFRAN) injection 4 mg  4 mg Intravenous Q6H PRN  . oxyCODONE-acetaminophen (PERCOCET) 5-325 MG per tablet 1-2 tablet  1-2 tablet Oral Q4H PRN  . traMADol (ULTRAM) tablet 50-100 mg  50-100 mg Oral Q6H PRN   Assessment and Plan: 56 yo F with hyperosmolar hyperglycemic state and acute renal failure admitted for control prior to planned surgery on 2/21 by ENT for CSF rhinorrhea.  POD #4 s/p surgery .  -Pt had Spinal Lumbar Drainage had been removed and is ready to be transfer to regular floor. Will keep bed at 20-30 degree angle for 8 h and then up ad lib. At that point she will benefit  of PT/OT consult. -Pt does not have record of Pneumovax in our office. As recommended for CSF leak she should get  Pneumococcal vaccine  PCV 13 before discharge follow up by PSV-23 8 weeks later. -Will continue nasal packing for 2 weeks.   DM T2: Previously borderline controlled with A1c 8.4 on Metformin and Amaryl but stopped treatment 2 months ago. - CBG 600 on admission. Placed on glucomander and hydrated. Glucoses mid to high 100's . - Today will restart metformin and  Lantus was decreased to 35 daily. Continue SSI - Pt will probably go home on insuline. And we will have diabetes education nurse consulted.   Acute on chronic kidney failure: Creatinine baseline of 1.33. 2.22 on admission,  likely due to osmotic diuresis and dehydration. Creatinine wnl -Continue to monitor.   Pulmonary Edema (?): BNP 775.8 . Last ECHO EF 55-60% in July/2011. CXR yesterday with cardiomegaly but no Pulmonary Edema, with mild atelectasias. Normal WOB, some rales present we think due to atelectasias.  Stoped furosemide yesterday and pt with no worsening symptoms. Physical exam improved from yesterday  - Continue  incentive spirometry   Nausea: Had improved still on . Zofran scheduled Q6.   Hypertension: .  - Continue on HCTZ and amlodipine. Will monitor closely in BP continue to increase above 140/90 will consider adding home losartan.  Hyperlipidemia  - Continue on Pravastatin.   Sleep apnea: Can't use cpap due to surgery. May benefit of continue O2 (6 L actual requirement ) - Posible discharge home on O2 therapy.  FEN/GI: Advance diet as tolerated. Carb modified.  PPx: Heparin.  Dispo: pending PT/OT recommendations   D. Pil[place] Philippa Sicks, MD PGY1, [facility] Medicine Teaching Service Pager [phone] 02/03/2012

## 2012-02-03 NOTE — Progress Notes (Signed)
I have seen and examined this patient. I have discussed with Dr Thomes Dinning.  I agree with their findings and plans as documented in their progress note for today.  Acute Issues  1. Acute left forefoot pain - Onset yesterday, painful to light touch. - Hx of "gout" in foot after eating shrimp - PE: increased warmth dorsum left foot. Tender to palpation of medial forefoot, Very tender to palpation of 1st MTP. No edema. No erythema. - Working diagnosis: Acute Gouty arthritis - Plan: Colchicine 1.2 mg by mouth then 0.6 mg one hour later (ordered).  Assess for significant improvement in pain and tenderness tomorrow to this diagnostic therapeutic maneuver.  Check uric acid level (ordered).

## 2012-02-03 NOTE — Progress Notes (Signed)
Subjective: POD#5 from left endoscopic repair of sphenoid CSF leak/encephalocele. Doing well. Per Rn report complained of some gouty foot pain, otherwise doing well.  Objective: Vital signs in last 24 hours: Temp:  [97.4 F (36.3 C)-98.8 F (37.1 C)] 97.4 F (36.3 C) (02/26 0400) Pulse Rate:  [61-78] 68  (02/26 0600) Resp:  [13-18] 16  (02/26 0600) BP: (99-158)/(43-84) 121/58 mmHg (02/26 0600) SpO2:  [92 %-97 %] 92 % (02/26 0600) Weight:  [116.4 kg (256 lb 9.9 oz)] 116.4 kg (256 lb 9.9 oz) (02/26 0400)  Sleeping, left nasal packing in place, no epistaxis  @LABLAST2 (wbc:2,hgb:2,hct:2,plt:2)  Basename 02/01/12 0600  NA 141  K 3.8  CL 100  CO2 34*  GLUCOSE 173*  BUN 8  CREATININE 1.01  CALCIUM 8.5    Medications:  Scheduled Meds:   . amLODipine  10 mg Oral Daily  . cefTRIAXone (ROCEPHIN)  IV  2 g Intravenous Q24H  . heparin subcutaneous  5,000 Units Subcutaneous Q8H  . hydrochlorothiazide  25 mg Oral Daily  . insulin aspart  0-20 Units Subcutaneous TID WC  . insulin aspart  0-5 Units Subcutaneous QHS  . insulin aspart  6 Units Subcutaneous TID WC  . insulin glargine  45 Units Subcutaneous QHS  . mupirocin ointment  1 application Nasal TID  . DISCONTD: furosemide  40 mg Intravenous Daily  . DISCONTD: losartan  25 mg Oral Daily   Continuous Infusions:   . sodium chloride 50 mL/hr at 02/03/12 0600   PRN Meds:.acetaminophen, acetaminophen, dextrose, docusate sodium, fentaNYL, hydrALAZINE, morphine injection, ondansetron (ZOFRAN) IV, ondansetron, oxyCODONE-acetaminophen, traMADol  Assessment/Plan: POD#5 from endoscopic repair of left sphenoid CSF leak/encephalocele. Could clamp lumbar drain and D/C from ENT perspective. Patient is not on home CPAP so if stable from diabetes standpoint okay to D/C home after lumbar drain D/C'd and once has plan for home management of diabetes. Will remove nasal packing 2 weeks post-op.   LOS: 7 days   Ruby Cola 02/03/2012, 6:25  AM

## 2012-02-04 LAB — GLUCOSE, CAPILLARY
Glucose-Capillary: 140 mg/dL — ABNORMAL HIGH (ref 70–99)
Glucose-Capillary: 165 mg/dL — ABNORMAL HIGH (ref 70–99)
Glucose-Capillary: 103 mg/dL — ABNORMAL HIGH (ref 70–99)
Glucose-Capillary: 192 mg/dL — ABNORMAL HIGH (ref 70–99)

## 2012-02-04 LAB — URIC ACID: Uric Acid, Serum: 9.1 mg/dL — ABNORMAL HIGH (ref 2.4–7.0)

## 2012-02-04 MED ORDER — PNEUMOCOCCAL 13-VAL CONJ VACC IM SUSP
0.5000 mL | INTRAMUSCULAR | Status: AC
  Filled 2012-02-04 (×2): qty 0.5

## 2012-02-04 MED ORDER — COLCHICINE 0.6 MG PO TABS
0.6000 mg | ORAL_TABLET | Freq: Two times a day (BID) | ORAL | Status: DC
  Administered 2012-02-04 – 2012-02-05 (×4): 0.6 mg via ORAL
  Filled 2012-02-04 (×5): qty 1

## 2012-02-04 MED ORDER — MENINGOCOCCAL A C Y&W-135 CONJ IM INJ
0.5000 mL | INJECTION | Freq: Once | INTRAMUSCULAR | Status: AC
  Administered 2012-02-04: 0.5 mL via INTRAMUSCULAR
  Filled 2012-02-04 (×2): qty 0.5

## 2012-02-04 MED ORDER — LOSARTAN POTASSIUM 25 MG PO TABS
25.0000 mg | ORAL_TABLET | Freq: Every day | ORAL | Status: DC
  Administered 2012-02-04 – 2012-02-06 (×3): 25 mg via ORAL
  Filled 2012-02-04 (×4): qty 1

## 2012-02-04 NOTE — Progress Notes (Signed)
Subjective: Patient reports no problems, not oob much yet  Objective: Vital signs in last 24 hours: Temp:  [97.1 F (36.2 C)-98.2 F (36.8 C)] 97.8 F (36.6 C) (02/27 0800) Pulse Rate:  [64-77] 64  (02/27 0800) Resp:  [13-20] 14  (02/27 0800) BP: (99-148)/(44-71) 130/68 mmHg (02/27 0800) SpO2:  [91 %-98 %] 92 % (02/27 0800)  Intake/Output from previous day: 02/26 0701 - 02/27 0700 In: 400 [I.V.:400] Out: 6 [Urine:675; Drains:35; Stool:2] Intake/Output this shift:    no nasal drainage, no drainage from lumbar drain site  Lab Results: No results found for this basename: WBC:2,HGB:2,HCT:2,PLT:2 in the last 72 hours BMET No results found for this basename: NA:2,K:2,CL:2,CO2:2,GLUCOSE:2,BUN:2,CREATININE:2,CALCIUM:2 in the last 72 hours  Studies/Results: No results found.  Assessment/Plan: Increase activity - d/c staples in back in ? couple  days (?before d/c)  LOS: 8 days     Ilay Capshaw R, MD 02/04/2012, 9:04 AM

## 2012-02-04 NOTE — Evaluation (Signed)
Occupational Therapy Evaluation Patient Details Name: [name] MRN: [id number] DOB: Dec 03, 1956 Today's Date: 02/04/2012  Problem List:  Patient Active Problem List  Diagnoses  . Hypertension  . Obesity  . HLD (hyperlipidemia)  . T2DM (type 2 diabetes mellitus)  . Headache  . CSF leak from nose  . Low back pain radiating to both legs  . Cough    Past Medical History:  Past Medical History  Diagnosis Date  . CVA (cerebral infarction) 03/29/2004  . HTN (hypertension)     takes Amlodipine daily  . Hyperlipidemia     takes Pravastatin daily  . Shortness of breath     with exertion  . Headache     related to CSF leakage  . Dizziness     related to CSF leakage  . Stroke 03/2004  . Arthritis     knuckles  . Hx of gout   . Diarrhea   . Urinary frequency   . Urinary urgency   . Diabetes mellitus   . Anxiety     doesn't take any meds for this   Past Surgical History:  Past Surgical History  Procedure Date  . Nm myoview ltd 08/25/2011    Low risk study, no evidence of ischemia, EF 44%  . Total abdominal hysterectomy 1994    OT Assessment/Plan/Recommendation OT Assessment Clinical Impression Statement: Pt s/p ENT/Neurosurgery surgery to sinus areas for CSF leak who has deficits in areas of pain in feet (questionable gout), decreased activity tolerance, and pain affecting I with all adls and mobility at this time. Pt would benefit from cont OT to increase I with basic adls so put can return home with her sister and mother. OT Recommendation/Assessment: Patient will need skilled OT in the acute care venue OT Problem List: Decreased strength;Decreased activity tolerance;Pain Problem List Comments: Most of pain is in feet from questionable gout. Barriers to Discharge: Decreased caregiver support Barriers to Discharge Comments: Mother and sister are at home but cannot physically assist pt after d/c OT Therapy Diagnosis : Generalized weakness;Acute pain OT Plan OT  Frequency: Min 2X/week OT Treatment/Interventions: Self-care/ADL training;DME and/or AE instruction;Therapeutic activities OT Recommendation Follow Up Recommendations: Home health OT;Skilled nursing facility;Other (comment);Supervision/Assistance - 24 hour (If d/c in next 24 hours, will need SNF.  Otherwise HH.) Equipment Recommended: None recommended by OT Individuals Consulted Consulted and Agree with Results and Recommendations: Patient OT Goals Acute Rehab OT Goals OT Goal Formulation: With patient Time For Goal Achievement: 2 weeks ADL Goals Pt Will Perform Grooming: with modified independence;Standing at sink ADL Goal: Grooming - Progress: Goal set today Pt Will Perform Lower Body Bathing: with set-up;Sit to stand from chair;Standing at sink;Sitting at sink ADL Goal: Lower Body Bathing - Progress: Goal set today Pt Will Perform Lower Body Dressing: with modified independence;Sit to stand from bed;Other (comment) (gathering own clothes.) ADL Goal: Lower Body Dressing - Progress: Goal set today Pt Will Perform Tub/Shower Transfer: Tub transfer;with supervision;Shower seat without back;Grab bars ADL Goal: Clinical cytogeneticist - Progress: Goal set today Additional ADL Goal #1: Pt will complete all aspects of toileting on comfort height commode with mod I. ADL Goal: Additional Goal #1 - Progress: Goal set today  OT Evaluation Precautions/Restrictions  Precautions Required Braces or Orthoses: No Restrictions Weight Bearing Restrictions: No Prior Functioning Home Living Lives With: Family Receives Help From: Family Type of Home: House Home Layout: One level Home Access: Stairs to enter Entrance Stairs-Rails: Left Entrance Stairs-Number of Steps: 2 Bathroom Shower/Tub: Tub/shower unit;Curtain Bathroom  Toilet: Handicapped height Bathroom Accessibility: Yes How Accessible: Accessible via walker Home Adaptive Equipment: Bedside commode/3-in-1;Grab bars in shower;Shower chair with  back;Walker - rolling Additional Comments: Pt sister has had knee replacement, therefore has lots of equipment. Prior Function Level of Independence: Independent with basic ADLs;Independent with homemaking with ambulation;Independent with gait;Independent with transfers Able to Take Stairs?: Yes Driving: Yes ADL ADL Eating/Feeding: Simulated;Set up Where Assessed - Eating/Feeding: Edge of bed Grooming: Simulated;Set up Where Assessed - Grooming: Standing at sink Upper Body Bathing: Simulated;Set up Where Assessed - Upper Body Bathing: Sitting, chair Lower Body Bathing: Simulated;Minimal assistance Lower Body Bathing Details (indicate cue type and reason): min assist reaching feet  Where Assessed - Lower Body Bathing: Sit to stand from chair Upper Body Dressing: Simulated;Set up Where Assessed - Upper Body Dressing: Sitting, chair Lower Body Dressing: Simulated;Minimal assistance Lower Body Dressing Details (indicate cue type and reason): assist socks and shoes. Where Assessed - Lower Body Dressing: Sit to stand from chair Toilet Transfer: Performed;Minimal assistance Toilet Transfer Details (indicate cue type and reason): min assist to initially stand.  On second and third trials she needed no physical assist. Toilet Transfer Method: Stand pivot Toilet Transfer Equipment: Bedside commode Toileting - Clothing Manipulation: Simulated;Minimal assistance Toileting - Clothing Manipulation Details (indicate cue type and reason): min assist to let go of walker to pull pants up. Where Assessed - Toileting Clothing Manipulation: Standing Toileting - Hygiene: Simulated;Minimal assistance Where Assessed - Toileting Hygiene: Sit on 3-in-1 or toilet Tub/Shower Transfer: Not assessed Tub/Shower Transfer Method: Not assessed Equipment Used: Rolling walker Ambulation Related to ADLs: Pt took a few steps to and from chair.   ADL Comments: Pt overall doing well with adls.  Most limited by pain in  feet. Vision/Perception  Vision - History Baseline Vision: No visual deficits Patient Visual Report: No change from baseline Vision - Assessment Eye Alignment: Within Functional Limits Vision Assessment: Vision not tested Cognition Cognition Arousal/Alertness: Awake/alert Overall Cognitive Status: Appears within functional limits for tasks assessed Orientation Level: Oriented X4 Sensation/Coordination Sensation Light Touch: Appears Intact Extremity Assessment RUE Assessment RUE Assessment: Within Functional Limits LUE Assessment LUE Assessment: Within Functional Limits Mobility  Bed Mobility Bed Mobility: Yes Rolling Left: 5: Supervision;With rail Supine to Sit: 4: Min assist;With rails;HOB elevated (Comment degrees) (HOb at 30 degrees) Supine to Sit Details (indicate cue type and reason): min guard just to get started. Sitting - Scoot to Edge of Bed: 5: Supervision Transfers Transfers: Yes Sit to Stand: 5: Supervision;From bed (first attempt was CG assist, then S for remainder.) Stand to Sit: 5: Supervision;To chair/3-in-1;With armrests;Without upper extremity assist Stand to Sit Details: Pt cannot tolerate standing long secondary to pain in feet. Exercises   End of Session OT - End of Session Equipment Utilized During Treatment: Gait belt Activity Tolerance: Patient limited by pain Patient left: in bed;with call bell in reach;with family/visitor present (nurse in room.) Nurse Communication: Mobility status for transfers General Behavior During Session: [facility] for tasks performed Cognition: [facility] for tasks performed   Glenford Peers [id number] 02/04/2012, 10:11 AM

## 2012-02-04 NOTE — Evaluation (Signed)
Physical Therapy Evaluation Patient Details Name: [name] MRN: [id number] DOB: 14-May-1956 Today's Date: 02/04/2012  Problem List:  Patient Active Problem List  Diagnoses  . Hypertension  . Obesity  . HLD (hyperlipidemia)  . T2DM (type 2 diabetes mellitus)  . Headache  . CSF leak from nose  . Low back pain radiating to both legs  . Cough    Past Medical History:  Past Medical History  Diagnosis Date  . CVA (cerebral infarction) 03/29/2004  . HTN (hypertension)     takes Amlodipine daily  . Hyperlipidemia     takes Pravastatin daily  . Shortness of breath     with exertion  . Headache     related to CSF leakage  . Dizziness     related to CSF leakage  . Stroke 03/2004  . Arthritis     knuckles  . Hx of gout   . Diarrhea   . Urinary frequency   . Urinary urgency   . Diabetes mellitus   . Anxiety     doesn't take any meds for this   Past Surgical History:  Past Surgical History  Procedure Date  . Nm myoview ltd 08/25/2011    Low risk study, no evidence of ischemia, EF 44%  . Total abdominal hysterectomy 1994    PT Assessment/Plan/Recommendation PT Assessment Clinical Impression Statement: Pt is 56 yo female s/p nasal surgery for CSF leak who now presents with gouty arthtitis in bilateral feet which is limiting her mobility more than the surgery.  If her foot pain becomes controlled, she should be able to d/c home with no f/u PT needs.  However, if she is discharged from hospital and her gout is still at its current level, she will need ST-SNF as she will not be able to get around her home and care for herself independently.  Though she lives with her mother and sister, she was helping them out, they will not be able to physically assist her.  PT will plan to follow acutely. PT Recommendation/Assessment: Patient will need skilled PT in the acute care venue PT Problem List: Decreased activity tolerance;Decreased mobility;Pain Barriers to Discharge: Decreased  caregiver support PT Therapy Diagnosis : Difficulty walking;Acute pain PT Plan PT Frequency: Min 3X/week PT Treatment/Interventions: DME instruction;Gait training;Stair training;Functional mobility training;Therapeutic activities;Therapeutic exercise;Patient/family education PT Recommendation Follow Up Recommendations: No PT follow up;Skilled nursing facility;Other (comment) (depending on progression/ mgmt of gout) Equipment Recommended: None recommended by PT PT Goals  Acute Rehab PT Goals PT Goal Formulation: With patient Time For Goal Achievement: 2 weeks Pt will go Supine/Side to Sit: with modified independence PT Goal: Supine/Side to Sit - Progress: Goal set today Pt will go Sit to Supine/Side: with modified independence PT Goal: Sit to Supine/Side - Progress: Goal set today Pt will go Sit to Stand: with modified independence PT Goal: Sit to Stand - Progress: Goal set today Pt will go Stand to Sit: with modified independence PT Goal: Stand to Sit - Progress: Goal set today Pt will Ambulate: with modified independence;with least restrictive assistive device;16 - 50 feet PT Goal: Ambulate - Progress: Goal set today Pt will Go Up / Down Stairs: 1-2 stairs;with rail(s);with supervision PT Goal: Up/Down Stairs - Progress: Goal set today  PT Evaluation Precautions/Restrictions  Precautions Required Braces or Orthoses: No Restrictions Weight Bearing Restrictions: No Prior Functioning  Home Living Lives With: Family (mother and sister) [place] Help From: Family Type of Home: House Home Layout: One level Home Access: Stairs to  enter Entrance Stairs-Rails: Left Entrance Stairs-Number of Steps: 2 Bathroom Shower/Tub: Tub/shower unit;Curtain Bathroom Toilet: Handicapped height Bathroom Accessibility: Yes How Accessible: Accessible via walker Home Adaptive Equipment: Bedside commode/3-in-1;Grab bars in shower;Shower chair with back;Walker - rolling Additional Comments: Pt's  sister has had knee replacement and is unable to assist her.  Mother also has health issues.  Pt had been independent up to this point and had been helping them out. Prior Function Level of Independence: Independent with basic ADLs;Independent with homemaking with ambulation;Independent with gait;Independent with transfers Able to Take Stairs?: Yes Driving: Yes Vocation: Unemployed Cognition Cognition Arousal/Alertness: Awake/alert Overall Cognitive Status: Appears within functional limits for tasks assessed Orientation Level: Oriented X4 Cognition - Other Comments: pt tearful when talking about physical status at this point and going home Sensation/Coordination Sensation Light Touch: Appears Intact Stereognosis: Not tested Hot/Cold: Not tested Proprioception: Appears Intact Coordination Gross Motor Movements are Fluid and Coordinated: Yes Fine Motor Movements are Fluid and Coordinated: Yes Extremity Assessment RUE Assessment RUE Assessment: Within Functional Limits LUE Assessment LUE Assessment: Within Functional Limits RLE Assessment RLE Assessment: Within Functional Limits (except ankle NT due to pain) LLE Assessment LLE Assessment: Within Functional Limits (except ankle NT due to pain) Mobility (including Balance) Bed Mobility Bed Mobility: Yes Rolling Left: 5: Supervision;With rail Supine to Sit: With rails;5: Supervision Supine to Sit Details (indicate cue type and reason): it took pt 2 tries to get weight shifted fully to left hip to maintain sitting Sitting - Scoot to Edge of Bed: 5: Supervision Transfers Transfers: Yes Sit to Stand: 5: Supervision;From bed Sit to Stand Details (indicate cue type and reason): vc's for hand placement, pt apprehensive due to foot pain Stand to Sit: 5: Supervision;To chair/3-in-1;With armrests;Without upper extremity assist Stand to Sit Details: controlled sit Stand Pivot Transfers: 4: Min assist Stand Pivot Transfer Details (indicate  cue type and reason): from bed to chair and chair to bed with RW and min A only for safety/ steadiness Ambulation/Gait Ambulation/Gait: No (due to foot pain) Stairs: No Wheelchair Mobility Wheelchair Mobility: No  Posture/Postural Control Posture/Postural Control: No significant limitations Balance Balance Assessed: Yes Dynamic Standing Balance Dynamic Standing - Balance Support: Bilateral upper extremity supported Dynamic Standing - Level of Assistance: 4: Min assist   End of Session PT - End of Session Equipment Utilized During Treatment: Gait belt Activity Tolerance: Patient limited by pain Patient left: in bed;with call bell in reach (pt could not tolerate staying up in chair) Nurse Communication: Mobility status for transfers General Behavior During Session: [facility] for tasks performed Cognition: [facility] for tasks performed  [place], [place] 02/04/2012, 12:33 PM

## 2012-02-04 NOTE — Progress Notes (Signed)
Subjective: POD#6 from endoscopic repair of left sphenoid encephalocele. Doing well. Lumbar drain removed yesterday, transferred to floor. Stable sat's on humidified nasal cannula.  Objective: Vital signs in last 24 hours: Temp:  [97.1 F (36.2 C)-98.2 F (36.8 C)] 97.1 F (36.2 C) (02/27 0525) Pulse Rate:  [65-77] 65  (02/27 0525) Resp:  [13-20] 20  (02/27 0525) BP: (99-148)/(44-71) 128/70 mmHg (02/27 0525) SpO2:  [91 %-98 %] 98 % (02/27 0525)  Oral cavity clear, right septal deviation, left nasal packing in place with steristrip, CN 2-12 intact and symmetric, normal voice  @LABLAST2 (wbc:2,hgb:2,hct:2,plt:2) No results found for this basename: NA:2,K:2,CL:2,CO2:2,GLUCOSE:2,BUN:2,CREATININE:2,CALCIUM:2 in the last 72 hours  Medications: Current facility-administered medications:0.9 %  sodium chloride infusion, , Intravenous, Continuous, Darlina Sicilian, NP, Last Rate: 50 mL/hr at 02/03/12 1637;  acetaminophen (TYLENOL) suppository 650 mg, 650 mg, Rectal, Q6H PRN, Luis Abed, MD;  acetaminophen (TYLENOL) tablet 650 mg, 650 mg, Oral, Q6H PRN, Luis Abed, MD;  amLODipine (NORVASC) tablet 10 mg, 10 mg, Oral, Daily, Luis Abed, MD, 10 mg at 02/03/12 [id number] cefTRIAXone (ROCEPHIN) 2 g in dextrose 5 % 50 mL IVPB, 2 g, Intravenous, Q24H, Ruby Cola, MD, 2 g at 02/03/12 1918;  colchicine tablet 0.6 mg, 0.6 mg, Oral, Once, Blane Ohara McDiarmid, MD, 0.6 mg at 02/03/12 1833;  colchicine tablet 1.2 mg, 1.2 mg, Oral, Once, Todd D McDiarmid, MD, 1.2 mg at 02/03/12 1723;  dextrose 50 % solution 25 mL, 25 mL, Intravenous, PRN, Luis Abed, MD docusate sodium (COLACE) capsule 100 mg, 100 mg, Oral, BID PRN, Ruby Cola, MD;  fentaNYL (SUBLIMAZE) injection 25-50 mcg, 25-50 mcg, Intravenous, Q2H PRN, Darlina Sicilian, NP, 50 mcg at 01/31/12 2035;  heparin injection 5,000 Units, 5,000 Units, Subcutaneous, Q8H, Ruby Cola, MD, 5,000 Units at 02/04/12 [phone];  hydrALAZINE (APRESOLINE) injection 10 mg, 10 mg,  Intravenous, Q4H PRN, Darlina Sicilian, NP, 10 mg at 01/29/12 1729 hydrochlorothiazide (HYDRODIURIL) tablet 25 mg, 25 mg, Oral, Daily, Lyndee Hensen, MD, 25 mg at 02/03/12 0929;  insulin aspart (novoLOG) injection 0-20 Units, 0-20 Units, Subcutaneous, TID WC, Lyndee Hensen, MD, 6 Units at 02/03/12 1722;  insulin aspart (novoLOG) injection 0-5 Units, 0-5 Units, Subcutaneous, QHS, Lyndee Hensen, MD, 2 Units at 01/30/12 2217 insulin aspart (novoLOG) injection 6 Units, 6 Units, Subcutaneous, TID WC, Lyndee Hensen, MD, 6 Units at 02/03/12 1722;  insulin glargine (LANTUS) injection 35 Units, 35 Units, Subcutaneous, QHS, Linna Darner, MD, 35 Units at 02/03/12 2209;  metFORMIN (GLUCOPHAGE) tablet 500 mg, 500 mg, Oral, BID WC, Linna Darner, MD, 500 mg at 02/03/12 1723;  morphine 2 MG/ML injection 1 mg, 1 mg, Intravenous, Q4H PRN, Ruby Cola, MD, 1 mg at 02/03/12 1736 mupirocin ointment (BACTROBAN) 2 % 1 application, 1 application, Nasal, TID, Ruby Cola, MD, 1 application at [phone] [id number];  ondansetron (ZOFRAN) injection 4 mg, 4 mg, Intravenous, Q6H PRN, Luis Abed, MD, 4 mg at 02/03/12 1317;  ondansetron (ZOFRAN) tablet 4 mg, 4 mg, Oral, Q6H PRN, Luis Abed, MD, 4 mg at 02/02/12 1732 oxyCODONE-acetaminophen (PERCOCET) 5-325 MG per tablet 1-2 tablet, 1-2 tablet, Oral, Q4H PRN, Ruby Cola, MD, 1 tablet at 02/03/12 0946;  traMADol (ULTRAM) tablet 50-100 mg, 50-100 mg, Oral, Q6H PRN, Luis Abed, MD;  DISCONTD: insulin glargine (LANTUS) injection 45 Units, 45 Units, Subcutaneous, QHS, Lyndee Hensen, MD, 45 Units at 02/02/12 2214  Assessment/Plan: POD#6 from endoscopic repair of left sphenoid encephalocele. Doing well. Will have PT/OT consult, continue stool softeners, will remove nasal packing POD#14, continue Rocephin IV while inpatient.  LOS: 8 days   Ruby Cola 02/04/2012, 7:21 AM

## 2012-02-04 NOTE — Progress Notes (Signed)
Continuity Note: [name] today, states surgery went well.  Agree with excellent care the FMTS team is providing.  Very much appreciate help from ENT and Neurosurgery with [name] throughout this entire course.  Had ? Gout flare yesterday, colchicine started, since improved.  Talked with her about stopping her diabetes medications, she states "I know that was a mistake and won't do that again".  Agree that insulin regimen would likely be more beneficial in controlling her blood glucose outpatient.  Still has foley in, will discuss with inpatient team about removing, she is able to ambulate to bedside toilet. Still with O2 requirement, plan to go home with O2.  Angel Nutting, DO

## 2012-02-04 NOTE — Progress Notes (Signed)
Patient ID: ZSOFIA TKACHENKO, female   DOB: 21-Jul-1956, 56 y.o.   MRN: [id number] PGY-1 Daily Progress Note Family Medicine Teaching Service D. [place] Philippa Sicks, MD Service Pager: [phone]  Patient name: [name]  Medical record [id number] Date of birth:02/16/1956 Age: 56 y.o. Gender: female  LOS: 8 days   Subjective:  Feeling better. Pain in her foot mildly improvement. She was able to walk with assistance. Was been assisted with comodin for BM. Objective:  Vitals: Temp:  [97.1 F (36.2 C)-98.2 F (36.8 C)] 97.8 F (36.6 C) (02/27 0800) Pulse Rate:  [64-77] 74  (02/27 0951) Resp:  [13-20] 14  (02/27 0800) BP: (99-148)/(44-70) 130/68 mmHg (02/27 0944) SpO2:  [89 %-98 %] 89 % (02/27 0951)  Intake/Output Summary (Last 24 hours) at 02/04/12 1026 Last data filed at 02/04/12 0900  Gross per 24 hour  Intake    490 ml  Output    477 ml  Net     13 ml   Physical Exam: Gen: NAD  HEENT: Moist mucous membranes  CV: Regular rate and rhythm, no murmurs rubs or gallops.  PULM: Clear to auscultation bilaterally. Presence of minimal rales bibasal. Improved from yesterday.  ABD: Soft, non tender, normal bowel sounds  EXT: No edema , erythema but pain to palpation on left metatarsal joints Neuro: Alert and oriented x3. No focalization. Spinal Drainage removed.  Labs and imaging:   CBG (last 3)   Basename 02/04/12 0630 02/03/12 2150 02/03/12 1658  GLUCAP 140* 153* 166*   Medications: Scheduled Meds:   . amLODipine  10 mg Oral Daily  . cefTRIAXone (ROCEPHIN)  IV  2 g Intravenous Q24H  . colchicine  0.6 mg Oral Once  . colchicine  0.6 mg Oral BID  . colchicine  1.2 mg Oral Once  . heparin subcutaneous  5,000 Units Subcutaneous Q8H  . insulin aspart  0-20 Units Subcutaneous TID WC  . insulin aspart  0-5 Units Subcutaneous QHS  . insulin aspart  6 Units Subcutaneous TID WC  . insulin glargine  35 Units Subcutaneous QHS  . losartan  25 mg Oral Daily  . metFORMIN  500  mg Oral BID WC  . mupirocin ointment  1 application Nasal TID  . DISCONTD: hydrochlorothiazide  25 mg Oral Daily   Continuous Infusions:   . sodium chloride 50 mL/hr at 02/04/12 1309   PRN Meds:.acetaminophen, acetaminophen, dextrose, docusate sodium, fentaNYL, hydrALAZINE, morphine injection, ondansetron (ZOFRAN) IV, ondansetron, oxyCODONE-acetaminophen, traMADol   Assessment and Plan: 56 yo F with hyperosmolar hyperglycemic state and acute renal failure admitted for control prior to planned surgery on 2/21 by ENT for CSF rhinorrhea.  POD #5 s/p surgery .  -Pt on regular flor  - PT/OT recommended Home health if pt is not discharge in the next 24h which is not happening due to poor ambulation s/p gout flare. - Pt does not have record of Pneumovax in our office. As recommended for CSF leak she should get  Pneumococcal vaccine  PCV 13 before discharge follow up by PSV-23 8 weeks later. -Will continue nasal packing for 2 weeks. -continue rocephin while impatient per ENT recommendation.   DM T2: Previously borderline controlled with A1c 8.4 on Metformin and Amaryl but stopped treatment 2 months ago. - CBG 600 on admission. Placed on glucomander and hydrated. Glucoses mid to high 100's . - Today will restart metformin and  Lantus increased today  to 45 units daily  due to CBG's and needed to cover  her with 20 units of Novolog yesterday.. Continue SSI - Pt will probably go home on insuline. And we will have diabetes education nurse consulted.   Acute on chronic kidney failure: Creatinine baseline of 1.33. 2.22 on admission, likely due to osmotic diuresis and dehydration. Creatinine wnl -Continue to monitor.   Pulmonary Edema (?): BNP 775.8 . Last ECHO EF 55-60% in July/2011. CXR yesterday with cardiomegaly but no Pulmonary Edema, with mild atelectasias. Normal WOB, some rales present we think due to atelectasias.  Stoped furosemide yesterday and pt with no worsening symptoms. Physical exam  improved from yesterday  - Continue  incentive spirometry   Nausea: Had improved still on . Zofran PRN  Hypertension: .  - Change from HCTZ to Losartan  and amlodipine. Discontinue the HCTZ due to gout flare.  Hyperlipidemia  - Continue on Pravastatin.   Sleep apnea: Can't use cpap due to surgery. May benefit of continue O2 (6 L actual requirement ) - Posible discharge home on O2 therapy.  Gout flare: Hx of gout in the past. Now pain on left metatarsal joint area. No edema, erythema but very painful to palpation.  Yesterday colchicine 1 time dose improved some of her symptoms. -continue Colchicine now 0.6mg  BID  FEN/GI: Advance diet as tolerated. Carb modified.  PPx: Heparin.  Dispo: pending    D. [place] Philippa Sicks, MD PGY1, Family Medicine Teaching Service Pager [phone] 02/04/2012

## 2012-02-05 ENCOUNTER — Encounter (HOSPITAL_COMMUNITY): Payer: Self-pay | Admitting: Otolaryngology

## 2012-02-05 LAB — GLUCOSE, CAPILLARY
Glucose-Capillary: 163 mg/dL — ABNORMAL HIGH (ref 70–99)
Glucose-Capillary: 131 mg/dL — ABNORMAL HIGH (ref 70–99)
Glucose-Capillary: 170 mg/dL — ABNORMAL HIGH (ref 70–99)

## 2012-02-05 LAB — CLOSTRIDIUM DIFFICILE BY PCR: Toxigenic C. Difficile by PCR: NEGATIVE

## 2012-02-05 MED ORDER — METOCLOPRAMIDE HCL 5 MG PO TABS
5.0000 mg | ORAL_TABLET | Freq: Three times a day (TID) | ORAL | Status: DC
  Administered 2012-02-05 – 2012-02-06 (×3): 5 mg via ORAL
  Filled 2012-02-05 (×8): qty 1

## 2012-02-05 NOTE — Progress Notes (Signed)
I discussed with Dr Thomes Dinning.  I agree with their plans documented in their  Progress Note for today.

## 2012-02-05 NOTE — Progress Notes (Signed)
Subjective: POD#7 from endoscopic repair of left sphenoid encephalocele/CSF leak. Doing well, foot pain is improved but still having some issues with mobility. No CSF rhinorrhea  Objective: Vital signs in last 24 hours: Temp:  [97.4 F (36.3 C)-97.7 F (36.5 C)] 97.4 F (36.3 C) (02/28 0508) Pulse Rate:  [64-74] 69  (02/28 0508) Resp:  [18-21] 18  (02/28 0508) BP: (119-152)/(50-72) 152/72 mmHg (02/28 0508) SpO2:  [89 %-97 %] 95 % (02/28 0508)  PE: awake, alert, strong voice, EOMI, PERRLA, visual fields intact and symmetric OU, left nasal packing in place, reinforced with tape/steristrips, no clear rhinorrhea or epistaxis.  @LABLAST2 (wbc:2,hgb:2,hct:2,plt:2) No results found for this basename: NA:2,K:2,CL:2,CO2:2,GLUCOSE:2,BUN:2,CREATININE:2,CALCIUM:2 in the last 72 hours  Medications: Current facility-administered medications:acetaminophen (TYLENOL) suppository 650 mg, 650 mg, Rectal, Q6H PRN, Luis Abed, MD;  acetaminophen (TYLENOL) tablet 650 mg, 650 mg, Oral, Q6H PRN, Luis Abed, MD;  amLODipine (NORVASC) tablet 10 mg, 10 mg, Oral, Daily, Luis Abed, MD, 10 mg at 02/04/12 0944;  cefTRIAXone (ROCEPHIN) 2 g in dextrose 5 % 50 mL IVPB, 2 g, Intravenous, Q24H, Ruby Cola, MD, 2 g at 02/04/12 1846 colchicine tablet 0.6 mg, 0.6 mg, Oral, BID, Linna Darner, MD, 0.6 mg at 02/04/12 2357;  docusate sodium (COLACE) capsule 100 mg, 100 mg, Oral, BID PRN, Ruby Cola, MD;  heparin injection 5,000 Units, 5,000 Units, Subcutaneous, Q8H, Ruby Cola, MD, 5,000 Units at 02/05/12 [id number];  hydrALAZINE (APRESOLINE) injection 10 mg, 10 mg, Intravenous, Q4H PRN, Darlina Sicilian, NP, 10 mg at 01/29/12 1729 insulin aspart (novoLOG) injection 0-20 Units, 0-20 Units, Subcutaneous, TID WC, Lyndee Hensen, MD, 4 Units at 02/05/12 0800;  insulin aspart (novoLOG) injection 0-5 Units, 0-5 Units, Subcutaneous, QHS, Lyndee Hensen, MD, 2 Units at 01/30/12 2217;  insulin aspart (novoLOG) injection 6 Units, 6  Units, Subcutaneous, TID WC, Lyndee Hensen, MD, 6 Units at 02/04/12 1249 insulin glargine (LANTUS) injection 35 Units, 35 Units, Subcutaneous, QHS, Linna Darner, MD, 35 Units at 02/04/12 2357;  losartan (COZAAR) tablet 25 mg, 25 mg, Oral, Daily, Linna Darner, MD, 25 mg at 02/04/12 1843;  meningococcal polysaccharide (MENACTRA) injection 0.5 mL, 0.5 mL, Intramuscular, Once, Ruby Cola, MD, 0.5 mL at 02/04/12 1840;  metFORMIN (GLUCOPHAGE) tablet 500 mg, 500 mg, Oral, BID WC, Linna Darner, MD, 500 mg at 02/04/12 1840 mupirocin ointment (BACTROBAN) 2 % 1 application, 1 application, Nasal, TID, Ruby Cola, MD, 1 application at [id number];  ondansetron (ZOFRAN) injection 4 mg, 4 mg, Intravenous, Q6H PRN, Luis Abed, MD, 4 mg at 02/03/12 1317;  ondansetron (ZOFRAN) tablet 4 mg, 4 mg, Oral, Q6H PRN, Luis Abed, MD, 4 mg at 02/05/12 0425 pneumococcal 13-valent conjugate vaccine (PREVNAR 13) injection 0.5 mL, 0.5 mL, Intramuscular, Tomorrow-1000, Ruby Cola, MD;  traMADol Veatrice Bourbon) tablet 50-100 mg, 50-100 mg, Oral, Q6H PRN, Luis Abed, MD;  DISCONTD: 0.9 %  sodium chloride infusion, , Intravenous, Continuous, Darlina Sicilian, NP, Last Rate: 50 mL/hr at 02/04/12 1309;  DISCONTD: dextrose 50 % solution 25 mL, 25 mL, Intravenous, PRN, Luis Abed, MD DISCONTD: fentaNYL (SUBLIMAZE) injection 25-50 mcg, 25-50 mcg, Intravenous, Q2H PRN, Darlina Sicilian, NP, 50 mcg at 01/31/12 2035;  DISCONTD: hydrochlorothiazide (HYDRODIURIL) tablet 25 mg, 25 mg, Oral, Daily, Lyndee Hensen, MD, 25 mg at 02/04/12 0946;  DISCONTD: morphine 2 MG/ML injection 1 mg, 1 mg, Intravenous, Q4H PRN, Ruby Cola, MD, 1 mg at 02/03/12 1736 DISCONTD: oxyCODONE-acetaminophen (PERCOCET) 5-325 MG per tablet 1-2 tablet, 1-2 tablet, Oral, Q4H PRN, Ruby Cola, MD, 2 tablet at 02/05/12 0425  Assessment/Plan: Doing  well POD#7 from endoscopic repair of left sphenoid encephalocele/CSF leak. dispo per OT/PT/family medicine team  depending on foot pain/mobility issues and home diabetes and oxygen needs. Appreciate Family Medicine help with her co morbidities. Will remove nasal packing POD#14, has Rx in chart for Doxycycline for strep/staph prophylaxis while packing in place and pain and antinausea medications.   LOS: 9 days   Ruby Cola 02/05/2012, 8:53 AM

## 2012-02-05 NOTE — Progress Notes (Signed)
I discussed with Dr Marily Memos.  I agree with their plans documented in their  Note for today.

## 2012-02-05 NOTE — Progress Notes (Signed)
[facility] Progress Note  Patient name: [name] Medical record number: [id number] Date of birth: March 06, 1956 Age: 56 y.o. Gender: female    LOS: 9 days   Primary Care Provider: MATTHEWS,CODY, DO, DO  Overnight Events:  NAEO. Continues to have nausea and emesis w/ meals despite Zofran. Continues to be in pain though diminished from the day prior. Now complaining of non-bloody diarrhea.     Objective: Vital signs in last 24 hours: Temp:  [97.4 F (36.3 C)-97.7 F (36.5 C)] 97.4 F (36.3 C) (02/28 0508) Pulse Rate:  [64-74] 69  (02/28 0508) Resp:  [18-21] 18  (02/28 0508) BP: (119-152)/(50-72) 152/72 mmHg (02/28 0508) SpO2:  [89 %-97 %] 95 % (02/28 0508)  Wt Readings from Last 3 Encounters:  02/03/12 256 lb 9.9 oz (116.4 kg)  02/03/12 256 lb 9.9 oz (116.4 kg)  01/27/12 249 lb 14.4 oz (113.354 kg)     Current Facility-Administered Medications  Medication Dose Route Frequency Provider Last Rate Last Dose  . acetaminophen (TYLENOL) tablet 650 mg  650 mg Oral Q6H PRN Luis Abed, MD       Or  . acetaminophen (TYLENOL) suppository 650 mg  650 mg Rectal Q6H PRN Luis Abed, MD      . amLODipine (NORVASC) tablet 10 mg  10 mg Oral Daily Luis Abed, MD   10 mg at 02/04/12 0944  . cefTRIAXone (ROCEPHIN) 2 g in dextrose 5 % 50 mL IVPB  2 g Intravenous Q24H Ruby Cola, MD   2 g at 02/04/12 1846  . colchicine tablet 0.6 mg  0.6 mg Oral BID Linna Darner, MD   0.6 mg at 02/04/12 2357  . docusate sodium (COLACE) capsule 100 mg  100 mg Oral BID PRN Ruby Cola, MD      . heparin injection 5,000 Units  5,000 Units Subcutaneous Q8H Ruby Cola, MD   5,000 Units at 02/05/12 [phone]  . hydrALAZINE (APRESOLINE) injection 10 mg  10 mg Intravenous Q4H PRN Darlina Sicilian, NP   10 mg at 01/29/12 1729  . insulin aspart (novoLOG) injection 0-20 Units  0-20 Units Subcutaneous TID WC Lyndee Hensen, MD   4 Units at 02/05/12 0800  . insulin aspart (novoLOG)  injection 0-5 Units  0-5 Units Subcutaneous QHS Lyndee Hensen, MD   2 Units at 01/30/12 2217  . insulin aspart (novoLOG) injection 6 Units  6 Units Subcutaneous TID WC Lyndee Hensen, MD   6 Units at 02/04/12 1249  . insulin glargine (LANTUS) injection 35 Units  35 Units Subcutaneous QHS Linna Darner, MD   35 Units at 02/04/12 2357  . losartan (COZAAR) tablet 25 mg  25 mg Oral Daily Linna Darner, MD   25 mg at 02/04/12 1843  . meningococcal polysaccharide (MENACTRA) injection 0.5 mL  0.5 mL Intramuscular Once Ruby Cola, MD   0.5 mL at 02/04/12 1840  . metFORMIN (GLUCOPHAGE) tablet 500 mg  500 mg Oral BID WC Linna Darner, MD   500 mg at 02/04/12 1840  . mupirocin ointment (BACTROBAN) 2 % 1 application  1 application Nasal TID Ruby Cola, MD   1 application at [id number]  . ondansetron (ZOFRAN) tablet 4 mg  4 mg Oral Q6H PRN Luis Abed, MD   4 mg at 02/05/12 0425   Or  . ondansetron [facility]) injection 4 mg  4 mg Intravenous Q6H PRN Luis Abed, MD   4 mg at 02/03/12 1317  . pneumococcal 13-valent conjugate vaccine (PREVNAR 13) injection 0.5  mL  0.5 mL Intramuscular Tomorrow-1000 Ruby Cola, MD      . traMADol Veatrice Bourbon) tablet 50-100 mg  50-100 mg Oral Q6H PRN Luis Abed, MD      . DISCONTD: 0.9 %  sodium chloride infusion   Intravenous Continuous Darlina Sicilian, NP 50 mL/hr at 02/04/12 1309    . DISCONTD: dextrose 50 % solution 25 mL  25 mL Intravenous PRN Luis Abed, MD      . DISCONTD: fentaNYL (SUBLIMAZE) injection 25-50 mcg  25-50 mcg Intravenous Q2H PRN Darlina Sicilian, NP   50 mcg at 01/31/12 2035  . DISCONTD: hydrochlorothiazide (HYDRODIURIL) tablet 25 mg  25 mg Oral Daily Lyndee Hensen, MD   25 mg at 02/04/12 0946  . DISCONTD: morphine 2 MG/ML injection 1 mg  1 mg Intravenous Q4H PRN Ruby Cola, MD   1 mg at 02/03/12 1736  . DISCONTD: oxyCODONE-acetaminophen (PERCOCET) 5-325 MG per tablet 1-2 tablet  1-2 tablet Oral Q4H PRN Ruby Cola, MD   2 tablet at 02/05/12  0425     PE: Gen: NAD, Obese  HEENT: Surgical packing string coming present CV:RRR [id number]  Labs/Studies:  CBG (last 3)   Basename 02/05/12 1105 02/05/12 0638 02/04/12 2120  GLUCAP 131* 163* 192*       Assessment/Plan:  56 yo F with resolved hyperosmolar hyperglycemic state and acute renal failure admitted for control prior to planned surgery on 2/21 by ENT for CSF rhinorrhea.   POD #7 s/p surgery .  ENT following. PT/OT following.  - Pt does not have record of Pneumovax in our office. As recommended for CSF leak she should get Pneumococcal vaccine PCV 13 before discharge follow up by PSV-23 8 weeks later.  -Will continue nasal packing for until 14 days postop -continue rocephin while impatient per ENT recommendation.   DM T2: Previously borderline controlled with A1c 8.4 on Metformin and Amaryl but stopped treatment 2 months ago. - CBG 600 on admission. Placed on glucomander and hydrated. BG well controlled since admission - Continue SSI  - Hold Metformin due to GI upset - Pt will probably go home on insulin.  - Continue DM education   Acute on chronic kidney failure: Creatinine baseline of 1.33. 2.22 on admission, likely due to osmotic diuresis and dehydration. Creatinine wnl  -Continue to monitor.   Pulmonary Edema (?): BNP 775.8 . Last ECHO EF 55-60% in July/2011. CXR on 2/26 with cardiomegaly but no Pulmonary Edema, with mild atelectasias. Normal WOB, some rales present we think due to atelectasias.  Stoped furosemide 2/26 and pt with no worsening symptoms. Res. Exam unremarkable as above - Continue incentive spirometry   Nausea: Had worsening over past 24hrs. N/V w/ every meal. Zofran not helping. Diabetic gastroporesis vs metformin intolerance, vs colchicine induced.  - Reglan TID  Hypertension: Fairly well controlled. HCTZ discontinued due to gout.  - continue Losartan and amlodipine  Hyperlipidemia  - Continue on Pravastatin.   Sleep apnea: Can't use cpap  due to surgery. May benefit of continue O2 (6 L actual requirement )  - Posible discharge home on O2 therapy.   Gout flare: Hx of gout in the past. Pain much improved in left metatarsal joint area. On colchicine  -continue Colchicine now 0.6mg  BID   FEN/GI: Nausea and emesis w/ meals. On zofran, Diarrhea since starting ABX, C.Diff vs ABX induced.  - nausea as above - Imodium, probiotics if C.Diff PCR negative - Continue Carb modified.   PPx: Heparin.   Dispo: pending  Signed: Linna Darner, MD Family Medicine Resident PGY-1 [phone] 02/05/2012 9:24 AM

## 2012-02-06 LAB — GLUCOSE, CAPILLARY
Glucose-Capillary: 162 mg/dL — ABNORMAL HIGH (ref 70–99)
Glucose-Capillary: 136 mg/dL — ABNORMAL HIGH (ref 70–99)
Glucose-Capillary: 190 mg/dL — ABNORMAL HIGH (ref 70–99)
Glucose-Capillary: 160 mg/dL — ABNORMAL HIGH (ref 70–99)

## 2012-02-06 MED ORDER — BACID PO TABS
2.0000 | ORAL_TABLET | Freq: Three times a day (TID) | ORAL | Status: DC
  Filled 2012-02-06 (×4): qty 2

## 2012-02-06 MED ORDER — LOPERAMIDE HCL 2 MG PO CAPS: 2.0000 mg | ORAL_CAPSULE | Freq: Three times a day (TID) | ORAL | Status: AC | PRN

## 2012-02-06 MED ORDER — DOXYCYCLINE HYCLATE 100 MG PO TABS: 100.0000 mg | ORAL_TABLET | Freq: Two times a day (BID) | ORAL | Status: DC

## 2012-02-06 MED ORDER — LOPERAMIDE HCL 2 MG PO CAPS
2.0000 mg | ORAL_CAPSULE | Freq: Three times a day (TID) | ORAL | Status: DC | PRN
  Administered 2012-02-06: 2 mg via ORAL
  Filled 2012-02-06: qty 1

## 2012-02-06 MED ORDER — AMLODIPINE BESYLATE 10 MG PO TABS: 10.0000 mg | ORAL_TABLET | Freq: Every day | ORAL | Status: DC

## 2012-02-06 MED ORDER — INSULIN GLARGINE 100 UNIT/ML ~~LOC~~ SOLN: 45.0000 [IU] | Freq: Every day | SUBCUTANEOUS | Status: DC

## 2012-02-06 MED ORDER — TRAMADOL HCL 50 MG PO TABS: 50.0000 mg | ORAL_TABLET | Freq: Four times a day (QID) | ORAL | Status: DC | PRN

## 2012-02-06 MED ORDER — LOPERAMIDE HCL 2 MG PO CAPS: 4.0000 mg | ORAL_CAPSULE | Freq: Once | ORAL | Status: DC

## 2012-02-06 MED ORDER — METOCLOPRAMIDE HCL 5 MG PO TABS: 5.0000 mg | ORAL_TABLET | Freq: Three times a day (TID) | ORAL | Status: DC

## 2012-02-06 MED ORDER — INSULIN PEN STARTER KIT
1.0000 | Freq: Once | Status: DC
  Filled 2012-02-06: qty 1

## 2012-02-06 MED ORDER — BACID PO TABS: 2.0000 | ORAL_TABLET | Freq: Three times a day (TID) | ORAL | Status: DC

## 2012-02-06 MED ORDER — ONDANSETRON HCL 4 MG PO TABS: 4.0000 mg | ORAL_TABLET | Freq: Three times a day (TID) | ORAL | Status: AC | PRN

## 2012-02-06 NOTE — Progress Notes (Signed)
Physical Therapy Treatment Patient Details Name: [name] MRN: [id number] DOB: 05-15-1956 Today's Date: 02/06/2012  PT Assessment/Plan  PT - Assessment/Plan Comments on Treatment Session: Pt agreeable to ambulation but requested not to go long due to having taking medicine for her bowels. Pt ambulated well and should be safe to DC home with supercvision PT Frequency: Min 3X/week Follow Up Recommendations: No PT follow up Equipment Recommended: None recommended by PT PT Goals  Acute Rehab PT Goals PT Goal: Supine/Side to Sit - Progress: Met PT Goal: Sit to Stand - Progress: Met PT Goal: Stand to Sit - Progress: Met PT Goal: Ambulate - Progress: Progressing toward goal  PT Treatment Precautions/Restrictions  Precautions Required Braces or Orthoses: No Restrictions Weight Bearing Restrictions: No Mobility (including Balance) Bed Mobility Rolling Left: 6: Modified independent (Device/Increase time) Sitting - Scoot to Edge of Bed: 6: Modified independent (Device/Increase time) Transfers Sit to Stand: 6: Modified independent (Device/Increase time) Stand to Sit: 6: Modified independent (Device/Increase time) Ambulation/Gait Ambulation/Gait: Yes Ambulation/Gait Assistance: 5: Supervision Ambulation/Gait Assistance Details (indicate cue type and reason): Cues for safety with RW Ambulation Distance (Feet): 55 Feet Assistive device: Rolling walker Gait Pattern: Trunk flexed;Step-through pattern;Decreased stride length    Exercise    End of Session PT - End of Session Equipment Utilized During Treatment: Gait belt Activity Tolerance: Patient tolerated treatment well Patient left: in chair;with call bell in reach (pt on toilet per request to sit for a while) General Behavior During Session: [facility] for tasks performed Cognition: [facility] for tasks performed  Artice Holohan, Tonia Brooms 02/06/2012, 11:30 AM 02/06/2012 Jacqualyn Posey PTA [phone] pager [phone]  office

## 2012-02-06 NOTE — Progress Notes (Signed)
CARE MANAGEMENT NOTE 02/06/2012    Action/Plan:   Discharge planning    DC Planning Services  CM consult      PAC Choice  DURABLE MEDICAL EQUIPMENT   Choice offered to / List presented to:     DME arranged  OXYGEN        HH arranged  NA      [place] agency  NA   Status of service:  In process, will continue to follow Comments:  02/06/12 1200 Angel Miller, RN BSN Case Manager Spoke with patient regarding ability to purchase medications. States she will be getting her orange card in the mail soon. Will be able to get them at the health dept. on Monday. Oxygen ordered for home- 4Liters via [place] continuous per MD. Will follow.

## 2012-02-06 NOTE — Discharge Summary (Signed)
Physician Discharge Summary  Patient ID: [name] [id number] 11-Jan-1956 56 y.o.  Admit date: 01/27/2012 Discharge date: 02/09/2012  PCP: MATTHEWS,CODY, DO, DO   Discharge Diagnosis: 1. CSF Leak s/p Sphenoidectomy endoscopic sinus surgery with stealth placement of lumbar drain.  2. DMT2 3. HTN 4. Sleep Apnea 5. Obesity 6. Acute on chronic  renal failure 7. Hyperlipidemia 8. Gout flare   Discharge Medications  [name], [name]  Home Medication Instructions [id number]   Printed on:02/09/12 0158  Medication Information                    Lancets (ACCU-CHEK MULTICLIX) lancets Use to check blood sugar every morning           amLODipine (NORVASC) 10 MG tablet Take 1 tablet (10 mg total) by mouth daily.           losartan (COZAAR) 25 MG tablet Take 1 tablet (25 mg total) by mouth daily.           pravastatin (PRAVACHOL) 40 MG tablet Take 1 tablet (40 mg total) by mouth every evening.           aspirin 325 MG tablet Take 325 mg by mouth daily.           traMADol (ULTRAM) 50 MG tablet Take 50-100 mg by mouth every 6 (six) hours as needed. For pain.   Maximum dose= 8 tablets per day           amLODipine (NORVASC) 10 MG tablet Take 1 tablet (10 mg total) by mouth daily.           insulin glargine (LANTUS) 100 UNIT/ML injection Inject 45 Units into the skin at bedtime.           lactobacillus acidophilus (BACID) TABS Take 2 tablets by mouth 3 (three) times daily.           loperamide (IMODIUM) 2 MG capsule Take 1 capsule (2 mg total) by mouth 3 (three) times daily as needed for diarrhea or loose stools.           metoCLOPramide (REGLAN) 5 MG tablet Take 1 tablet (5 mg total) by mouth 3 (three) times daily before meals.           ondansetron (ZOFRAN) 4 MG tablet Take 1 tablet (4 mg total) by mouth every 8 (eight) hours as needed for nausea.              Consults: Neurosurgery, ENT   Labs: CBC No results found for this basename:  WBC:3,HGB:3,HCT:3,PLT:3 in the last 168 hours BMET No results found for this basename: NA:3,K:3,CL:3,CO2:3,BUN:3,CREATININE:3,CALCIUM:3,LABALBU:3,PROT:3,BILITOT:3,ALKPHOS:3,ALT:3,AST:3,GLUCOSE:3 in the last 168 hours Results for orders placed during the hospital encounter of 01/27/12 (from the past 72 hour(s))  GLUCOSE, CAPILLARY     Status: Abnormal   Collection Time   02/06/12  6:05 AM      Component Value Range Comment   Glucose-Capillary 136 (*) 70 - 99 (mg/dL)    Comment 1 Documented in Chart      Comment 2 Notify RN     GLUCOSE, CAPILLARY     Status: Abnormal   Collection Time   02/06/12 11:38 AM      Component Value Range Comment   Glucose-Capillary 190 (*) 70 - 99 (mg/dL)   GLUCOSE, CAPILLARY     Status: Abnormal   Collection Time   02/06/12  5:04 PM      Component Value Range Comment   Glucose-Capillary 160 (*) 70 -  99 (mg/dL)    Comment 1 Documented in Chart      Comment 2 Notify RN       Procedures/Imaging:  Dg Chest Port 1 View  02/01/2012  *RADIOLOGY REPORT*  Clinical Data: Crackles within the lungs on physical examination.  PORTABLE CHEST - 1 VIEW 02/01/2012 1145 hours:  Comparison: Portable chest x-ray 01/30/2012, 01/29/2012, and two- view chest x-ray 11/07/2011.  Findings: Cardiac silhouette enlarged but stable.  Mild pulmonary venous hypertension without overt edema.  Improved aeration in the lung bases since the examination 2 days ago, with mild bibasilar atelectasis persisting.  Improved aeration in the left upper lobe, with mild atelectasis persisting.  No new pulmonary parenchymal abnormalities.  Right jugular central venous catheter tip remains in the SVC.  IMPRESSION: Stable cardiomegaly without pulmonary edema.  Improved aeration in the lung bases and in the left upper lobe, with mild atelectasis persisting.  No new abnormalities.  Original Report Authenticated By: Deniece Portela, M.D.   Dg Chest Port 1 View  01/30/2012  *RADIOLOGY REPORT*  Clinical Data:  Respiratory failure  PORTABLE CHEST - 1 VIEW  Comparison: 01/29/2012  Findings: Right internal jugular central line is unchanged with its tip at the SVC/RA junction.  Venous hypertension and mild interstitial edema persist.  I think there is more volume loss in both lower lobes compared to yesterday.  No discernible effusion.  IMPRESSION: Worsening volume loss in the lower lobes.  Continued suspicion of fluid overload/mild edema  Original Report Authenticated By: Jules Schick, M.D.   Dg Chest Port 1 View  01/29/2012  *RADIOLOGY REPORT*  Clinical Data: Respiratory failure.  PORTABLE CHEST - 1 VIEW  Comparison: 11/07/2011  Findings: There is cardiomegaly.  Vascular congestion and mild interstitial prominence.  Question early interstitial edema.  No effusions or acute bony abnormality.  Right central line tip is at the cavoatrial junction.  No pneumothorax.  IMPRESSION: Cardiomegaly, vascular congestion.  Question mild interstitial edema.  Original Report Authenticated By: Raelyn Number, M.D.     [place] Course: 56 yo F with resolved hyperosmolar hyperglycemic state and acute renal failure admitted for control prior to planned surgery on 2/21 by ENT for CSF rhinorrhea.  1. POD: Pt had surgery with no complications and was discharged at 7 day POD  -Will continue nasal packing for until 14 days postop.  - on Doxycycline at discharge per ENT recommendations.  2. DM T2: Previously borderline controlled with A1c 8.4 on Metformin and Amaryl but stopped treatment 2 months ago. - CBG 600 on admission. Placed on glucomander and hydrated. BG well controlled since admission. Was reinitiated on metformin and pt developed diarrhea and Gi discomfort we don't think Metformin as culprit but could add to her Gi symptoms. Pt sent home on Insuline Lantus 45 units daily and f/u as outpatient. She received DM education while in the hospital. 3. Acute on chronic kidney failure:  Creatinine baseline of 1.33. 2.22 on  admission, likely due to osmotic diuresis and dehydration. Creatinine at discharge was wnl  4. Pulmonary Edema (?): BNP 775.8 . Last ECHO EF 55-60% in July/2011. CXR on 2/26 with cardiomegaly but no Pulmonary Edema, with mild atelectasias. Normal WOB, some rales present we think due to atelectasias.  Stoped furosemide 2/26 and pt with no worsening symptoms. Res. Exam unremarkable. If necessary please consider ECHO. 5. Nausea and diarrhea Had worsening over past 24hrs prior to discharge  Zofran was not helping. Diabetic gastroporesis vs metformin intolerance, vs colchicine induced. C  diff negative. Aggravating agents were stopped and pt started on Reglan TID.  6. Hypertension: Fairly well controlled. HCTZ discontinued due to gout.  Continued on  Losartan and Amlodipine  7. Hyperlipidemia: on Pravastatin.  8. Sleep apnea: Can't use cpap due to surgery. May benefit of continue O2 (6 L actual requirement ) discharged home on Home oxygen. 9. Gout flare: Hx of gout in the past. Pain much improved in left metatarsal joint area. Colchicine was discontinued . On tramadol PRN   Patient condition at time of discharge/disposition:  Patient is discharge home on stable medical condition.   Follow up issues 1.  Pneumococcal vaccine PCV 13 follow up by PSV-23 8 weeks later.   Discharge follow up:  Discharge Orders    Future Appointments: Provider: Department: Dept Phone: Center:   02/18/2012 3:15 PM Luetta Nutting, DO [facility] Resident [phone] [facility]    ENT: Dr Simeon Craft in seven days Neurosurgery: Dr Luiz Ochoa  D. [place] Philippa Sicks, MD  [facility] Pontes [facility] 02/09/2012

## 2012-02-06 NOTE — Progress Notes (Signed)
PGY-1 Daily Progress Note Family Medicine Teaching Service D. [place] Philippa Sicks, MD Service Pager: [phone]  Patient name: [name]  Medical record [id number] Date of birth:03-30-1956 Age: 56 y.o. Gender: female  LOS: 10 days   Subjective: Still with diarrhea an mild nausea. Abdominal pain relieved. Foot pain improved.  Objective: Vitals: Temp:  [97.8 F (36.6 C)-98 F (36.7 C)] 97.8 F (36.6 C) (03/01 0522) Pulse Rate:  [64-71] 71  (03/01 0522) Resp:  [18-20] 18  (03/01 0522) BP: (136-165)/(59-89) 143/89 mmHg (03/01 0522) SpO2:  [86 %-96 %] 96 % (03/01 0522)  Intake/Output Summary (Last 24 hours) at 02/06/12 0848 Last data filed at 02/06/12 0846  Gross per 24 hour  Intake   1072 ml  Output    400 ml  Net    672 ml   Physical Exam: Gen: NAD  HEENT: Moist mucous membranes. String from surgical packing present. CV: Regular rate and rhythm, no murmurs rubs or gallops.  PULM: Clear to auscultation bilaterally. Presence of minimal rales bibasal. Improved from yesterday.  ABD: Soft, non tender, normal bowel sounds  BACK: staples removed. EXT: No edema , erythema but pain to palpation on left metatarsal joints  Neuro: Alert and oriented x3. No focalization. Spinal Drainage removed.  Labs and imaging:   CBG (last 3)   Basename 02/06/12 0605 02/05/12 2052 02/05/12 1613  GLUCAP 136* 162* 170*   Results for orders placed during the hospital encounter of 01/27/12 (from the past 24 hour(s))  CLOSTRIDIUM DIFFICILE BY PCR     Status: Normal   Collection Time   02/05/12  1:22 PM      Component Value Range   C difficile by pcr NEGATIVE  NEGATIVE    No results found. Medications: Scheduled Meds:   . amLODipine  10 mg Oral Daily  . cefTRIAXone (ROCEPHIN)  IV  2 g Intravenous Q24H  . heparin subcutaneous  5,000 Units Subcutaneous Q8H  . insulin aspart  0-20 Units Subcutaneous TID WC  . insulin aspart  0-5 Units Subcutaneous QHS  . insulin aspart  6 Units  Subcutaneous TID WC  . insulin glargine  35 Units Subcutaneous QHS  . losartan  25 mg Oral Daily  . metoCLOPramide  5 mg Oral TID AC  . mupirocin ointment  1 application Nasal TID  . pneumococcal 13-valent conjugate vaccine  0.5 mL Intramuscular Tomorrow-1000   Continuous Infusions:  PRN Meds:.acetaminophen, acetaminophen, docusate sodium, hydrALAZINE, traMADol, DISCONTD: ondansetron (ZOFRAN) IV, DISCONTD: ondansetron  Assessment and Plan: 56 yo F with resolved hyperosmolar hyperglycemic state and acute renal failure admitted for control prior to planned surgery on 2/21 by ENT for CSF rhinorrhea.   1. POD #7 s/p surgery . ENT following. PT/OT following.  - Pt does not have record of Pneumovax in our office. As recommended for CSF leak she should get Pneumococcal vaccine PCV 13 before discharge follow up by PSV-23 8 weeks later.  -Will continue nasal packing for until 14 days postop.  -Continue rocephin while impatient change to doxycycline at discharge per ENT recommendations.   2. DM T2: Previously borderline controlled with A1c 8.4 on Metformin and Amaryl but stopped treatment 2 months ago. - CBG 600 on admission. Placed on glucomander and hydrated. BG well controlled since admission  - Continue Insulin and SSI  - Hold Metformin due to GI upset  - Pt will probably go home on insulin.  - Continue DM education   3. Acute on chronic kidney failure: Creatinine baseline  of 1.33. 2.22 on admission, likely due to osmotic diuresis and dehydration. Creatinine wnl  -Continue to monitor.   4. Pulmonary Edema (?): BNP 775.8 . Last ECHO EF 55-60% in July/2011. CXR on 2/26 with cardiomegaly but no Pulmonary Edema, with mild atelectasias. Normal WOB, some rales present we think due to atelectasias.  Stoped furosemide 2/26 and pt with no worsening symptoms. Res. Exam unremarkable as above  - Continue incentive spirometry   5. Nausea and diarrhea Had worsening over past 24hrs. N/V w/ every meal.  Zofran not helping. Diabetic gastroporesis vs metformin intolerance, vs colchicine induced. C diff negative - Discontinued possible aggravating agents (metformin and colchicine) - Reglan TID  - Probiotics and imodium.  6. Hypertension: Fairly well controlled. HCTZ discontinued due to gout.  - Continue Losartan and Amlodipine   7. Hyperlipidemia  - Continue on Pravastatin.   8. Sleep apnea: Can't use cpap due to surgery. May benefit of continue O2 (6 L actual requirement ) Case already dicussed with case management. Will f/u. - Posible discharge home on O2 therapy.   9. Gout flare: Hx of gout in the past. Pain much improved in left metatarsal joint area.  - Colchicine discontinued . On tramadol PRN  FEN/GI: CHO modified diet PPx: Heparin.  Dispo: Possible going home today with HH. Pending also Diabetes education and O2 for home arranged.  D. [place] Philippa Sicks, MD PGY1, [facility] Medicine Teaching Service Pager [phone] 02/06/2012   \

## 2012-02-06 NOTE — Progress Notes (Signed)
Doing well.  No HA, No nasal drainage  Temp:  [97.8 F (36.6 C)-98 F (36.7 C)] 97.8 F (36.6 C) (03/01 0522) Pulse Rate:  [64-71] 71  (03/01 0522) Resp:  [18-20] 18  (03/01 0522) BP: (136-165)/(59-89) 143/89 mmHg (03/01 0522) SpO2:  [86 %-96 %] 96 % (03/01 0522) Neuro intact, no nasal drainage  Plan: Neurosurgically pt doing well - f/u 3-4 week with me - call if we can be of further assistance

## 2012-02-06 NOTE — Progress Notes (Signed)
I discussed with Dr Thomes Dinning.  I agree with their plans documented in their note for today.  Patient already has documented SaO2 desaturation less than 88% at rest that improves to greater than 90% with oxygen supplementation. She has Chronic Respiratory Failure secondary to her Obesity Hypoventilation Syndrome and Obstructive Sleep Apnea.  She will need home Oxygen supplementation at 2 left/min via [place] for > 16 hours a day for next 1-2 months.

## 2012-02-06 NOTE — Progress Notes (Signed)
Inpatient Diabetes Program Recommendations  AACE/ADA: New Consensus Statement on Inpatient Glycemic Control (2009)  Target Ranges:  Prepandial:   less than 140 mg/dL      Peak postprandial:   less than 180 mg/dL (1-2 hours)      Critically ill patients:  140 - 180 mg/dL    Received consult for "discharge home on insulin(first time user)".  Diabetes Coordinator ordered Insulin Starter-kit 01-28-12 and asked RN's to begin insulin administration education at that time.  Another kit has been ordered today bc the patient's family apparently took the other one home. Diabetes Coordinator spoke with Matt Holmes, and she will make sure patient is instructed on insulin pen administration today.  RN was instructed to let the patient use the needles in the starter-kit since they are not safety needles and it is what the patient will use at home.  RN also instructed NOT to touch the non-safety needles but to have the patient apply, remove and discard the needle.    Thank you  Raoul Pitch [facility] Inpatient Diabetes Coordinator [phone]

## 2012-02-06 NOTE — Progress Notes (Signed)
Subjective: POD#8 from endoscopic repair of left sphenoid CSF leak/encephalocele. Doing well. Is still having some foot pain and loose stools (had 4 loose bowel movements yesterday). Her C. Diff PCR was negative.  Objective: Vital signs in last 24 hours: Temp:  [97.8 F (36.6 C)-98 F (36.7 C)] 97.8 F (36.6 C) (03/01 0522) Pulse Rate:  [64-71] 71  (03/01 0522) Resp:  [18-20] 18  (03/01 0522) BP: (136-165)/(59-89) 143/89 mmHg (03/01 0522) SpO2:  [86 %-96 %] 96 % (03/01 0522)  Left nasal packing in place, no clear rhinorrhea or epistaxis, strong voice  @LABLAST2 (wbc:2,hgb:2,hct:2,plt:2) No results found for this basename: NA:2,K:2,CL:2,CO2:2,GLUCOSE:2,BUN:2,CREATININE:2,CALCIUM:2 in the last 72 hours  Medications: Current facility-administered medications:acetaminophen (TYLENOL) suppository 650 mg, 650 mg, Rectal, Q6H PRN, Luis Abed, MD;  acetaminophen (TYLENOL) tablet 650 mg, 650 mg, Oral, Q6H PRN, Luis Abed, MD, 650 mg at 02/05/12 2054;  amLODipine (NORVASC) tablet 10 mg, 10 mg, Oral, Daily, Luis Abed, MD, 10 mg at 02/05/12 1125;  cefTRIAXone (ROCEPHIN) 2 g in dextrose 5 % 50 mL IVPB, 2 g, Intravenous, Q24H, Ruby Cola, MD, 2 g at 02/05/12 1823 docusate sodium (COLACE) capsule 100 mg, 100 mg, Oral, BID PRN, Ruby Cola, MD;  heparin injection 5,000 Units, 5,000 Units, Subcutaneous, Q8H, Ruby Cola, MD, 5,000 Units at 02/06/12 0522;  hydrALAZINE (APRESOLINE) injection 10 mg, 10 mg, Intravenous, Q4H PRN, Darlina Sicilian, NP, 10 mg at 01/29/12 1729;  insulin aspart (novoLOG) injection 0-20 Units, 0-20 Units, Subcutaneous, TID WC, Lyndee Hensen, MD, 3 Units at 02/06/12 0819 insulin aspart (novoLOG) injection 0-5 Units, 0-5 Units, Subcutaneous, QHS, Lyndee Hensen, MD, 2 Units at 01/30/12 2217;  insulin aspart (novoLOG) injection 6 Units, 6 Units, Subcutaneous, TID WC, Lyndee Hensen, MD, 6 Units at 02/04/12 1249;  insulin glargine (LANTUS) injection 35 Units, 35 Units,  Subcutaneous, QHS, Linna Darner, MD, 35 Units at 02/05/12 2110 lactobacillus acidophilus (BACID) tablet 2 tablet, 2 tablet, Oral, TID, Ruby Cola, MD;  loperamide (IMODIUM) capsule 2 mg, 2 mg, Oral, TID PRN, Ruby Cola, MD;  losartan (COZAAR) tablet 25 mg, 25 mg, Oral, Daily, Linna Darner, MD, 25 mg at 02/05/12 1125;  metoCLOPramide (REGLAN) tablet 5 mg, 5 mg, Oral, TID AC, Linna Darner, MD, 5 mg at 02/06/12 0818 mupirocin ointment (BACTROBAN) 2 % 1 application, 1 application, Nasal, TID, Ruby Cola, MD, 1 application at [id number];  pneumococcal 13-valent conjugate vaccine (PREVNAR 13) injection 0.5 mL, 0.5 mL, Intramuscular, Tomorrow-1000, Ruby Cola, MD;  traMADol Veatrice Bourbon) tablet 50-100 mg, 50-100 mg, Oral, Q6H PRN, Luis Abed, MD;  DISCONTD: colchicine tablet 0.6 mg, 0.6 mg, Oral, BID, Linna Darner, MD, 0.6 mg at 02/05/12 1125 DISCONTD: metFORMIN (GLUCOPHAGE) tablet 500 mg, 500 mg, Oral, BID WC, Linna Darner, MD, 500 mg at 02/04/12 1840;  DISCONTD: ondansetron [facility]) injection 4 mg, 4 mg, Intravenous, Q6H PRN, Luis Abed, MD, 4 mg at 02/05/12 1129;  DISCONTD: ondansetron (ZOFRAN) tablet 4 mg, 4 mg, Oral, Q6H PRN, Luis Abed, MD, 4 mg at 02/05/12 0425  Assessment/Plan: POD#8 from left endoscopic repair of sphenoid CSF leak/encephalocele. Ordered loperamide PRN and lactobacillus for diarrhea since C. Diff negative. Continue rocephin while packing in place. Will remove nasal packing POD#14. Encourage ambulation.   LOS: 10 days   Ruby Cola 02/06/2012, 8:51 AM

## 2012-02-06 NOTE — Progress Notes (Signed)
Progress note--FMTS Patient documented to have an O2 saturation of 87% 02/28 on room air. Her saturation went to 94% on nasal cannula. We would recommend that the patient be on 2L [place] at home, especially due to her history of obstructive sleep apnea/obesity hypoventilation with inability to tolerate BiPAP.    Samuel Germany. Verdie Drown, MD PGY-2 [phone]

## 2012-02-06 NOTE — Discharge Instructions (Signed)
-  Please take medications as prescribed. -Doxycycline is a new antibiotic you should take until you see Dr Simeon Craft (ENT) in a week. Please make sure you schedule an appointment with him since he has to remove the nasal packing on that visit. -Your next appointment with Dr. Rodena Piety is on March 13 at 3:00 pm. If you have problems with your blood sugars or other concerns call the [facility].

## 2012-02-09 ENCOUNTER — Telehealth: Payer: Self-pay | Admitting: Family Medicine

## 2012-02-09 NOTE — Discharge Summary (Signed)
I discussed with Dr Thomes Dinning.  I agree with their plans documented in their  Discharge Note.

## 2012-02-09 NOTE — Telephone Encounter (Signed)
Spoke with patient and advised we can give her 2 samples of the Lantus. [employer] lot # [id number] exp date 05/2014. She plans to go to health dept tomorrow to try and sign up with them so she can get insulin from them.

## 2012-02-09 NOTE — Telephone Encounter (Signed)
Pt is out of her insulin and cannot afford anything at this time.  She has called health dept and they do not have any samples.  She is asking if we have some. Her last dose was last night.

## 2012-02-18 ENCOUNTER — Ambulatory Visit: Payer: Self-pay

## 2012-02-18 ENCOUNTER — Ambulatory Visit (INDEPENDENT_AMBULATORY_CARE_PROVIDER_SITE_OTHER): Payer: Self-pay | Admitting: Family Medicine

## 2012-02-18 DIAGNOSIS — H538 Other visual disturbances: Secondary | ICD-10-CM

## 2012-02-18 DIAGNOSIS — R51 Headache: Secondary | ICD-10-CM

## 2012-02-18 DIAGNOSIS — R319 Hematuria, unspecified: Secondary | ICD-10-CM

## 2012-02-18 DIAGNOSIS — N201 Calculus of ureter: Secondary | ICD-10-CM | POA: Insufficient documentation

## 2012-02-18 DIAGNOSIS — I10 Essential (primary) hypertension: Secondary | ICD-10-CM

## 2012-02-18 DIAGNOSIS — E119 Type 2 diabetes mellitus without complications: Secondary | ICD-10-CM

## 2012-02-18 DIAGNOSIS — G9601 Cranial cerebrospinal fluid leak, spontaneous: Secondary | ICD-10-CM

## 2012-02-18 LAB — POCT URINALYSIS DIPSTICK
Bilirubin, UA: NEGATIVE
Glucose, UA: NEGATIVE
Ketones, UA: NEGATIVE
Leukocytes, UA: NEGATIVE
Nitrite, UA: NEGATIVE
Protein, UA: 30
Spec Grav, UA: >=1.03
Urobilinogen, UA: 0.2
pH, UA: 5.5

## 2012-02-18 LAB — BASIC METABOLIC PANEL
BUN: 18 mg/dL (ref 6–23)
CO2: 25 mEq/L (ref 19–32)
Calcium: 8.5 mg/dL (ref 8.4–10.5)
Chloride: 102 mEq/L (ref 96–112)
Creat: 1.31 mg/dL — ABNORMAL HIGH (ref 0.50–1.10)
Glucose, Bld: 224 mg/dL — ABNORMAL HIGH (ref 70–99)
Potassium: 3.9 mEq/L (ref 3.5–5.3)
Sodium: 141 mEq/L (ref 135–145)

## 2012-02-18 LAB — POCT UA - MICROSCOPIC ONLY: RBC, urine, microscopic: >20

## 2012-02-18 LAB — POCT GLYCOSYLATED HEMOGLOBIN (HGB A1C): Hemoglobin A1C: 10.3

## 2012-02-18 LAB — GLUCOSE, CAPILLARY: Glucose-Capillary: 230 mg/dL — ABNORMAL HIGH (ref 70–99)

## 2012-02-18 LAB — GLUCOSE, POCT (MANUAL RESULT ENTRY): POC Glucose: 230

## 2012-02-18 MED ORDER — AMLODIPINE BESYLATE 10 MG PO TABS: 10.0000 mg | ORAL_TABLET | Freq: Every day | ORAL | Status: DC

## 2012-02-18 MED ORDER — INSULIN GLARGINE 100 UNIT/ML ~~LOC~~ SOLN: 45.0000 [IU] | Freq: Every day | SUBCUTANEOUS | Status: DC

## 2012-02-18 MED ORDER — ATORVASTATIN CALCIUM 10 MG PO TABS: 10.0000 mg | ORAL_TABLET | Freq: Every day | ORAL | Status: DC

## 2012-02-18 NOTE — Patient Instructions (Signed)
Thank you for coming in today, it was good to see you  I will let you know whether it is safe for you to restart your losartan again once I get the results back from your labs.   We will try to get you an appointment with Dr. Baird Cancer sooner

## 2012-02-19 ENCOUNTER — Telehealth: Payer: Self-pay | Admitting: *Deleted

## 2012-02-19 NOTE — Telephone Encounter (Signed)
Called patient's home number and spoke with patient's mom. I was given a cell phone number to reach her at which does not have voice mail. If she returns call she has an appointment for CT at [facility] on Monday 02/23/12 at 8:45. She will need to pick up the contrast over the weekend from 1st floor radiology or arrive 2 hours early on Monday.[name], [name]

## 2012-02-19 NOTE — Telephone Encounter (Signed)
Message given to patient.

## 2012-02-20 LAB — URINE CULTURE: Colony Count: 40000

## 2012-02-23 ENCOUNTER — Ambulatory Visit (HOSPITAL_COMMUNITY)
Admission: RE | Admit: 2012-02-23 | Discharge: 2012-02-23 | Disposition: A | Discharge status: Home / Self Care | Payer: Self-pay | Source: Ambulatory Visit | Attending: Family Medicine | Admitting: Family Medicine

## 2012-02-23 DIAGNOSIS — R109 Unspecified abdominal pain: Secondary | ICD-10-CM | POA: Insufficient documentation

## 2012-02-23 DIAGNOSIS — R319 Hematuria, unspecified: Secondary | ICD-10-CM | POA: Insufficient documentation

## 2012-02-23 DIAGNOSIS — N134 Hydroureter: Secondary | ICD-10-CM | POA: Insufficient documentation

## 2012-02-23 DIAGNOSIS — R160 Hepatomegaly, not elsewhere classified: Secondary | ICD-10-CM | POA: Insufficient documentation

## 2012-02-23 DIAGNOSIS — N201 Calculus of ureter: Secondary | ICD-10-CM | POA: Insufficient documentation

## 2012-02-23 DIAGNOSIS — K7689 Other specified diseases of liver: Secondary | ICD-10-CM | POA: Insufficient documentation

## 2012-02-23 DIAGNOSIS — K5730 Diverticulosis of large intestine without perforation or abscess without bleeding: Secondary | ICD-10-CM | POA: Insufficient documentation

## 2012-02-24 ENCOUNTER — Other Ambulatory Visit (HOSPITAL_COMMUNITY): Payer: Self-pay | Admitting: Otolaryngology

## 2012-02-25 ENCOUNTER — Encounter (HOSPITAL_COMMUNITY): Payer: Self-pay | Admitting: Pharmacy Technician

## 2012-02-25 ENCOUNTER — Telehealth: Payer: Self-pay | Admitting: Family Medicine

## 2012-02-25 NOTE — Telephone Encounter (Signed)
Message copied by Luetta Nutting on Wed Feb 25, 2012 11:12 AM ------      Message from: Dorcas Mcmurray L      Created: Mon Feb 23, 2012 12:26 PM                   ----- Message -----         From: Rad Results In Interface         Sent: 02/23/2012  10:18 AM           To: Dickie La, MD

## 2012-02-25 NOTE — Telephone Encounter (Signed)
Called patient to give her results of CT.  Multiple stones noted on CT.  ?oxalate vs uric acid stones.  States pain is better.  Told her to pick up urine strainer from our office and to bring in any stones that she may collect so that we can send to pathology.  If Uric acid stones may need to be on chronic medication to control uric acid.

## 2012-02-27 ENCOUNTER — Telehealth: Payer: Self-pay | Admitting: *Deleted

## 2012-02-27 NOTE — Telephone Encounter (Signed)
Called pt. She has a strainer from her aunt and has been using it. Advised to bring in stone, in case she passes one. Pt verbalized understanding. [name], [name]

## 2012-03-01 ENCOUNTER — Encounter (HOSPITAL_COMMUNITY): Payer: Self-pay

## 2012-03-01 ENCOUNTER — Encounter (HOSPITAL_COMMUNITY)
Admission: RE | Admit: 2012-03-01 | Discharge: 2012-03-01 | Disposition: A | Discharge status: Home / Self Care | Payer: Self-pay | Source: Ambulatory Visit | Attending: Otolaryngology | Admitting: Otolaryngology

## 2012-03-01 HISTORY — DX: Calculus of kidney: N20.0

## 2012-03-01 LAB — BASIC METABOLIC PANEL
BUN: 14 mg/dL (ref 6–23)
CO2: 25 mEq/L (ref 19–32)
Calcium: 8.7 mg/dL (ref 8.4–10.5)
Chloride: 98 mEq/L (ref 96–112)
Creatinine, Ser: 1.02 mg/dL (ref 0.50–1.10)
GFR calc Af Amer: 71 mL/min — ABNORMAL LOW (ref >90)
GFR calc non Af Amer: 61 mL/min — ABNORMAL LOW (ref >90)
Glucose, Bld: 256 mg/dL — ABNORMAL HIGH (ref 70–99)
Potassium: 4 mEq/L (ref 3.5–5.1)
Sodium: 137 mEq/L (ref 135–145)

## 2012-03-01 LAB — CBC
HCT: 43.3 % (ref 36.0–46.0)
Hemoglobin: 14.5 g/dL (ref 12.0–15.0)
MCH: 30.6 pg (ref 26.0–34.0)
MCHC: 33.5 g/dL (ref 30.0–36.0)
MCV: 91.4 fL (ref 78.0–100.0)
Platelets: 208 10*3/uL (ref 150–400)
RBC: 4.74 MIL/uL (ref 3.87–5.11)
RDW: 14.1 % (ref 11.5–15.5)
WBC: 10.5 10*3/uL (ref 4.0–10.5)

## 2012-03-01 LAB — SURGICAL PCR SCREEN
MRSA, PCR: NEGATIVE
Staphylococcus aureus: NEGATIVE

## 2012-03-01 NOTE — Pre-Procedure Instructions (Signed)
[place]  03/01/2012   Your procedure is scheduled on:  Wednesday, March 27  Report to [place] at 11:30 AM.  Call this number if you have problems the morning of surgery: [phone]   Remember:   Do not eat food:After Midnight.  May have clear liquids: up to 4 Hours before arrival.  Clear liquids include soda, tea, black coffee, apple or grape juice, broth.  Take these medicines the morning of surgery with A SIP OF WATER: Amlodipine, Tramadol if needed   Do not wear jewelry, make-up or nail polish.  Do not wear lotions, powders, or perfumes. You may wear deodorant.  Do not shave 48 hours prior to surgery.  Do not bring valuables to the hospital.  Contacts, dentures or bridgework may not be worn into surgery.  Leave suitcase in the car. After surgery it may be brought to your room.  For patients admitted to the hospital, checkout time is 11:00 AM the day of discharge.   Patients discharged the day of surgery will not be allowed to drive home.  Name and phone number of your driver:   Special Instructions: CHG Shower Use Special Wash: 1/2 bottle night before surgery and 1/2 bottle morning of surgery.   Please read over the following fact sheets that you were given: Pain Booklet, Coughing and Deep Breathing and Surgical Site Infection Prevention

## 2012-03-02 DIAGNOSIS — H538 Other visual disturbances: Secondary | ICD-10-CM | POA: Insufficient documentation

## 2012-03-02 NOTE — Assessment & Plan Note (Addendum)
?   Localized swelling From recent surgery. No signs of infection or neurological deficit.  Can try acetaminophen for pain control, if worsening advvised to return or go to ED.

## 2012-03-02 NOTE — Progress Notes (Signed)
  Subjective:    Patient ID: [name], female    DOB: 10/08/1956, 56 y.o.   MRN: [id number]  HPI Here for hospital f/u since repair of CSF leak,  Since discharge has had  1. Intermittent headache and blurred vision:  Has been experiencing intermittent headaches and blurred vision since discharge from hospital.  Vision seem cloudy at times, no total loss of vision.  Has f/u with Dr. Simeon Craft (ENT) and told everything looks good so far.  Denies any nausea, weakness, or dizziness with this.  Vision better with wearing her glasses, but she states she did not need these that often prior to surgery.  Has upcoming appointment with Dr. Baird Cancer in opthalmology.    2. Hematuria:  Has noticed blood in her urine for the past few days.  Denies dysuria but she has been having some flank and lower abdominal pain.  Diagnosed with possible gout flare in hospital.  No fevers, chills.    3. DM:  Was on oral hypoglycemics prior to hospitalization however she was not taking these.  Admitted with hyperglycemia due to not taking medications.  Changed to lantus in hospital with better control of blood glucose.  She is using her insulin but she is not checking her blood glucose because she says she can not afford strips.  As above is having blurred vision, denies increased thirst or urinary frequency.  Is also planning to go to diabetes education classes.  4. ARF:  Had initial ARF when first admitted to hospital.  Thought to be 2/2 to dehydration.  Ace-I and metformin held in hospital and improved with fluids.  Making good urine, but with blood now.     Review of Systems     Objective:   Physical Exam  Constitutional: She is oriented to person, place, and time.       Obese female, nad  HENT:  Mouth/Throat: Oropharynx is clear and moist.  Neck: Normal range of motion. Neck supple.  Cardiovascular: Normal rate, regular rhythm and normal heart sounds.   Pulmonary/Chest: Effort normal and breath sounds normal.    Abdominal: Bowel sounds are normal. There is no tenderness. There is no rebound and no CVA tenderness.  Neurological: She is alert and oriented to person, place, and time. No cranial nerve deficit. Coordination normal.          Assessment & Plan:

## 2012-03-02 NOTE — Assessment & Plan Note (Signed)
Surgical repair on 2/19.  Appears to successful so far.

## 2012-03-02 NOTE — Assessment & Plan Note (Addendum)
Not at goal.  Using Lantus but not checing CBG, I am hesistant to change current dosage given that she is not checking sugars.  Has qualified for MAP program, she says she will be able to afford strips now.  She is also planning on going to DM education classes.

## 2012-03-02 NOTE — Assessment & Plan Note (Signed)
Concerning for stone given flank pain.  UA with blood and uric acid crystals.  Had questionable gout flare in hospital, now concern for uric acid stone.  Will sent for CT abd/pelvis for further evaluation.

## 2012-03-02 NOTE — Assessment & Plan Note (Signed)
Neuro exam normal.  CBG elevated but I don't think it is elevated enough to be causing these  Unsure of etiology but I do not believe this is anything emergent, as her vision improves with corrective lenses and she has no neurological deficits.  Could be retinopathy of diabetes.  To see Dr. Baird Cancer in ophthamology soon.

## 2012-03-03 ENCOUNTER — Encounter (HOSPITAL_COMMUNITY): Payer: Self-pay | Admitting: Anesthesiology

## 2012-03-03 ENCOUNTER — Encounter (HOSPITAL_COMMUNITY): Payer: Self-pay | Admitting: *Deleted

## 2012-03-03 ENCOUNTER — Ambulatory Visit (HOSPITAL_COMMUNITY)
Admission: RE | Admit: 2012-03-03 | Discharge: 2012-03-03 | Disposition: A | Discharge status: Home / Self Care | Payer: Self-pay | Source: Ambulatory Visit | Attending: Otolaryngology | Admitting: Otolaryngology

## 2012-03-03 ENCOUNTER — Encounter (HOSPITAL_COMMUNITY)
Admission: RE | Disposition: A | Discharge status: Home / Self Care | Payer: Self-pay | Source: Ambulatory Visit | Attending: Otolaryngology

## 2012-03-03 ENCOUNTER — Ambulatory Visit (HOSPITAL_COMMUNITY): Payer: Self-pay | Admitting: Anesthesiology

## 2012-03-03 DIAGNOSIS — E119 Type 2 diabetes mellitus without complications: Secondary | ICD-10-CM | POA: Insufficient documentation

## 2012-03-03 DIAGNOSIS — J3489 Other specified disorders of nose and nasal sinuses: Secondary | ICD-10-CM | POA: Insufficient documentation

## 2012-03-03 DIAGNOSIS — I10 Essential (primary) hypertension: Secondary | ICD-10-CM | POA: Insufficient documentation

## 2012-03-03 DIAGNOSIS — F17200 Nicotine dependence, unspecified, uncomplicated: Secondary | ICD-10-CM | POA: Insufficient documentation

## 2012-03-03 DIAGNOSIS — Z01812 Encounter for preprocedural laboratory examination: Secondary | ICD-10-CM | POA: Insufficient documentation

## 2012-03-03 DIAGNOSIS — Z8673 Personal history of transient ischemic attack (TIA), and cerebral infarction without residual deficits: Secondary | ICD-10-CM | POA: Insufficient documentation

## 2012-03-03 DIAGNOSIS — G9601 Cranial cerebrospinal fluid leak, spontaneous: Secondary | ICD-10-CM | POA: Insufficient documentation

## 2012-03-03 DIAGNOSIS — J342 Deviated nasal septum: Secondary | ICD-10-CM | POA: Insufficient documentation

## 2012-03-03 DIAGNOSIS — E785 Hyperlipidemia, unspecified: Secondary | ICD-10-CM | POA: Insufficient documentation

## 2012-03-03 LAB — GLUCOSE, CAPILLARY
Glucose-Capillary: 189 mg/dL — ABNORMAL HIGH (ref 70–99)
Glucose-Capillary: 150 mg/dL — ABNORMAL HIGH (ref 70–99)

## 2012-03-03 SURGERY — CONTROL OF EPISTAXIS, ENDOSCOPIC
Anesthesia: General | Site: Nose | Laterality: Left | Wound class: Clean Contaminated

## 2012-03-03 MED ORDER — MIDAZOLAM HCL 5 MG/5ML IJ SOLN
INTRAMUSCULAR | Status: DC | PRN
  Administered 2012-03-03: 2 mg via INTRAVENOUS

## 2012-03-03 MED ORDER — FENTANYL CITRATE 0.05 MG/ML IJ SOLN
50.0000 ug | INTRAMUSCULAR | Status: DC | PRN
  Administered 2012-03-03 (×2): 50 ug via INTRAVENOUS

## 2012-03-03 MED ORDER — FLUORESCEIN SODIUM 1 MG OP STRP
1.0000 | ORAL_STRIP | Freq: Once | OPHTHALMIC | Status: DC
  Filled 2012-03-03: qty 1

## 2012-03-03 MED ORDER — FENTANYL CITRATE 0.05 MG/ML IJ SOLN
INTRAMUSCULAR | Status: AC
  Filled 2012-03-03: qty 2

## 2012-03-03 MED ORDER — SUCCINYLCHOLINE CHLORIDE 20 MG/ML IJ SOLN
INTRAMUSCULAR | Status: DC | PRN
  Administered 2012-03-03: 120 mg via INTRAVENOUS

## 2012-03-03 MED ORDER — ONDANSETRON HCL 4 MG/2ML IJ SOLN
INTRAMUSCULAR | Status: DC | PRN
  Administered 2012-03-03: 4 mg via INTRAVENOUS

## 2012-03-03 MED ORDER — HYDROMORPHONE HCL PF 1 MG/ML IJ SOLN
INTRAMUSCULAR | Status: AC
Start: 2012-03-03 — End: 2012-03-03
  Administered 2012-03-03: 0.25 mg via INTRAVENOUS
  Filled 2012-03-03: qty 1

## 2012-03-03 MED ORDER — OXYMETAZOLINE HCL 0.05 % NA SOLN
NASAL | Status: DC | PRN
  Administered 2012-03-03: 1 via NASAL

## 2012-03-03 MED ORDER — MIDAZOLAM HCL 2 MG/2ML IJ SOLN
1.0000 mg | INTRAMUSCULAR | Status: DC | PRN
  Administered 2012-03-03 (×2): 1 mg via INTRAVENOUS

## 2012-03-03 MED ORDER — LIDOCAINE HCL (CARDIAC) 20 MG/ML IV SOLN
INTRAVENOUS | Status: DC | PRN
  Administered 2012-03-03: 80 mg via INTRAVENOUS

## 2012-03-03 MED ORDER — FLUORESCEIN-BENOXINATE 0.25-0.4 % OP SOLN
1.0000 [drp] | Freq: Once | OPHTHALMIC | Status: DC
  Filled 2012-03-03: qty 5

## 2012-03-03 MED ORDER — ONDANSETRON HCL 4 MG/2ML IJ SOLN: 4.0000 mg | Freq: Four times a day (QID) | INTRAMUSCULAR | Status: DC | PRN

## 2012-03-03 MED ORDER — LACTATED RINGERS IV SOLN
INTRAVENOUS | Status: DC
  Administered 2012-03-03: 13:00:00 via INTRAVENOUS

## 2012-03-03 MED ORDER — FLUORESCEIN SODIUM 1 MG OP STRP
ORAL_STRIP | OPHTHALMIC | Status: DC | PRN
  Administered 2012-03-03 (×2): 1

## 2012-03-03 MED ORDER — CEFAZOLIN SODIUM-DEXTROSE 2-3 GM-% IV SOLR
INTRAVENOUS | Status: AC
  Administered 2012-03-03: 2 g via INTRAVENOUS
  Filled 2012-03-03: qty 50

## 2012-03-03 MED ORDER — FENTANYL CITRATE 0.05 MG/ML IJ SOLN
INTRAMUSCULAR | Status: DC | PRN
  Administered 2012-03-03: 150 ug via INTRAVENOUS

## 2012-03-03 MED ORDER — HYDROMORPHONE HCL PF 1 MG/ML IJ SOLN
0.2500 mg | INTRAMUSCULAR | Status: DC | PRN
  Administered 2012-03-03: 0.25 mg via INTRAVENOUS
  Administered 2012-03-03 (×2): 0.5 mg via INTRAVENOUS

## 2012-03-03 MED ORDER — PROPOFOL 10 MG/ML IV BOLUS
INTRAVENOUS | Status: DC | PRN
  Administered 2012-03-03: 200 mg via INTRAVENOUS

## 2012-03-03 MED ORDER — ALBUTEROL SULFATE HFA 108 (90 BASE) MCG/ACT IN AERS
INHALATION_SPRAY | RESPIRATORY_TRACT | Status: DC | PRN
  Administered 2012-03-03 (×2): 2 via RESPIRATORY_TRACT

## 2012-03-03 MED ORDER — CEFAZOLIN SODIUM-DEXTROSE 2-3 GM-% IV SOLR
2.0000 g | Freq: Once | INTRAVENOUS | Status: DC
  Filled 2012-03-03: qty 50

## 2012-03-03 MED ORDER — MIDAZOLAM HCL 2 MG/2ML IJ SOLN
INTRAMUSCULAR | Status: AC
  Filled 2012-03-03: qty 2

## 2012-03-03 MED ORDER — 0.9 % SODIUM CHLORIDE (POUR BTL) OPTIME
TOPICAL | Status: DC | PRN
  Administered 2012-03-03: 1000 mL

## 2012-03-03 MED ORDER — DEXAMETHASONE SODIUM PHOSPHATE 4 MG/ML IJ SOLN
INTRAMUSCULAR | Status: DC | PRN
  Administered 2012-03-03: 4 mg via INTRAVENOUS

## 2012-03-03 SURGICAL SUPPLY — 44 items
ATTRACTOMAT 16X20 MAGNETIC DRP (DRAPES) IMPLANT
BLADE RAD40 ROTATE 4M 4 5PK (BLADE) IMPLANT
BLADE RAD60 ROTATE M4 4 5PK (BLADE) IMPLANT
BLADE TRICUT ROTATE M4 4 5PK (BLADE) IMPLANT
CANISTER SUCTION 2500CC (MISCELLANEOUS) ×2 IMPLANT
CLOTH BEACON ORANGE TIMEOUT ST (SAFETY) ×2 IMPLANT
COAGULATOR SUCT SWTCH 10FR 6 (ELECTROSURGICAL) IMPLANT
CORDS BIPOLAR (ELECTRODE) IMPLANT
CRADLE DONUT ADULT HEAD (MISCELLANEOUS) IMPLANT
DECANTER SPIKE VIAL GLASS SM (MISCELLANEOUS) IMPLANT
DRESSING NASAL POPE 10X1.5X2.5 (GAUZE/BANDAGES/DRESSINGS) IMPLANT
DRESSING TELFA 8X3 (GAUZE/BANDAGES/DRESSINGS) IMPLANT
DRSG NASAL POPE 10X1.5X2.5 (GAUZE/BANDAGES/DRESSINGS)
DURASEAL SPINE SEALANT 3ML (MISCELLANEOUS) ×2 IMPLANT
ELECT REM PT RETURN 9FT ADLT (ELECTROSURGICAL)
ELECTRODE REM PT RTRN 9FT ADLT (ELECTROSURGICAL) IMPLANT
FILTER ARTHROSCOPY CONVERTOR (FILTER) IMPLANT
FLOSEAL 10ML (HEMOSTASIS) IMPLANT
GLOVE SURG SS PI 7.5 STRL IVOR (GLOVE) ×2 IMPLANT
GOWN STRL NON-REIN LRG LVL3 (GOWN DISPOSABLE) ×4 IMPLANT
KIT BASIN OR (CUSTOM PROCEDURE TRAY) ×2 IMPLANT
KIT ROOM TURNOVER OR (KITS) ×2 IMPLANT
NEEDLE SPNL 25GX3.5 QUINCKE BL (NEEDLE) IMPLANT
NS IRRIG 1000ML POUR BTL (IV SOLUTION) ×2 IMPLANT
PAD ARMBOARD 7.5X6 YLW CONV (MISCELLANEOUS) ×4 IMPLANT
PATTIES SURGICAL .5 X3 (DISPOSABLE) ×2 IMPLANT
PENCIL FOOT CONTROL (ELECTRODE) ×4 IMPLANT
SHEATH ENDOSCRUB 0 DEG (SHEATH) IMPLANT
SHEATH ENDOSCRUB 30 DEG (SHEATH) IMPLANT
SHEATH ENDOSCRUB 45 DEG (SHEATH) IMPLANT
SOLUTION ANTI FOG 6CC (MISCELLANEOUS) ×2 IMPLANT
SPECIMEN JAR SMALL (MISCELLANEOUS) ×2 IMPLANT
STRIP CLOSURE SKIN 1/2X4 (GAUZE/BANDAGES/DRESSINGS) IMPLANT
SUCTION FRAZIER TIP 10 FR DISP (SUCTIONS) ×2 IMPLANT
SUT ETHILON 3 0 PS 1 (SUTURE) IMPLANT
SWAB COLLECTION DEVICE MRSA (MISCELLANEOUS) IMPLANT
SYR 50ML SLIP (SYRINGE) IMPLANT
SYRINGE 10CC LL (SYRINGE) ×2 IMPLANT
TOWEL OR 17X24 6PK STRL BLUE (TOWEL DISPOSABLE) ×2 IMPLANT
TOWEL OR 17X26 10 PK STRL BLUE (TOWEL DISPOSABLE) ×2 IMPLANT
TRAY ENT MC OR (CUSTOM PROCEDURE TRAY) ×2 IMPLANT
TUBE CONNECTING 12X1/4 (SUCTIONS) IMPLANT
WATER STERILE IRR 1000ML POUR (IV SOLUTION) IMPLANT
WIPE INSTRUMENT VISIWIPE 73X73 (MISCELLANEOUS) ×2 IMPLANT

## 2012-03-03 NOTE — Anesthesia Postprocedure Evaluation (Signed)
Anesthesia Post Note  Patient: [name]  Procedure(s) Performed: Procedure(s) (LRB): NASAL ENDOSCOPY WITH EPISTAXIS CONTROL (Left)  Anesthesia type: General  Patient location: PACU  Post pain: Pain level controlled and Adequate analgesia  Post assessment: Post-op Vital signs reviewed, Patient's Cardiovascular Status Stable, Respiratory Function Stable, Patent Airway and Pain level controlled  Last Vitals:  Filed Vitals:   03/03/12 1240  BP:   Pulse: 72  Temp:   Resp:     Post vital signs: Reviewed and stable  Level of consciousness: awake, alert  and oriented  Complications: No apparent anesthesia complications

## 2012-03-03 NOTE — H&P (Signed)
03/03/12  [name]  PREOPERATIVE HISTORY AND PHYSICAL  CHIEF COMPLAINT: left CSF leak  HISTORY: This is a 56 year old who presents with left nasal drainage s/p left endoscopic encephalocele repair.  SHe now presents for left nasal endoscopy with debridement and topical Fluorescein to evaluate for a recurrent CSF leak.  Dr. Simeon Craft, Alroy Dust has discussed the risks, benefits, and alternatives of this procedure. The patient understands the risks and would like to proceed with the procedure. The chances of success of the procedure are >50% and the patient understands this. I personally performed an examination of the patient within 24 hours of the procedure.  PAST MEDICAL HISTORY: Past Medical History  Diagnosis Date  . CVA (cerebral infarction) 03/29/2004  . HTN (hypertension)     takes Amlodipine daily  . Hyperlipidemia     takes Pravastatin daily  . Shortness of breath     with exertion  . Headache     related to CSF leakage  . Dizziness     related to CSF leakage  . Stroke 03/2004  . Arthritis     knuckles  . Hx of gout   . Diarrhea   . Urinary frequency   . Urinary urgency   . Diabetes mellitus   . Anxiety     doesn't take any meds for this  . Kidney stones     PAST SURGICAL HISTORY: Past Surgical History  Procedure Date  . Nm myoview ltd 08/25/2011    Low risk study, no evidence of ischemia, EF 44%  . Total abdominal hysterectomy 1994  . Sphenoidectomy 01/29/2012    Procedure: SPHENOIDECTOMY;  Surgeon: Angel Cola, MD;  Location: [place];  Service: ENT;  Laterality: Left;  REPAIR OF LEFT SPHENOID    . Nasal sinus surgery 01/29/2012    Procedure: ENDOSCOPIC SINUS SURGERY WITH STEALTH;  Surgeon: Angel Cola, MD;  Location: [facility] OR;  Service: ENT;  Laterality: N/A;    MEDICATIONS: Current facility-administered medications:nitroGLYCERIN (NITROSTAT) SL tablet 0.4 mg, 0.4 mg, Sublingual, Once, Luetta Nutting, DO Current outpatient prescriptions:amLODipine (NORVASC) 10 MG  tablet, Take 10 mg by mouth daily., Disp: , Rfl: ;  aspirin 325 MG tablet, Take 325 mg by mouth daily., Disp: , Rfl: ;  atorvastatin (LIPITOR) 10 MG tablet, Take 10 mg by mouth daily., Disp: , Rfl: ;  insulin glargine (LANTUS) 100 UNIT/ML injection, Inject 45 Units into the skin daily., Disp: , Rfl:  lactobacillus acidophilus (BACID) TABS, Take 2 tablets by mouth 3 (three) times daily., Disp: , Rfl: ;  losartan (COZAAR) 25 MG tablet, Take 25 mg by mouth daily., Disp: , Rfl: ;  traMADol (ULTRAM) 50 MG tablet, Take 50 mg by mouth every 6 (six) hours as needed. For pain., Disp: , Rfl:   ALLERGIES: Allergies  Allergen Reactions  . Vicodin (Hydrocodone-Acetaminophen) Nausea And Vomiting    SOCIAL HISTORY: History   Social History  . Marital Status: Single    Spouse Name: N/A    Number of Children: N/A  . Years of Education: N/A   Occupational History  . Not on file.   Social History Main Topics  . Smoking status: Current Everyday Smoker -- 0.2 packs/day for 35 years  . Smokeless tobacco: Not on file  . Alcohol Use: No  . Drug Use: No  . Sexually Active: No   Other Topics Concern  . Not on file   Social History Narrative   Currently unemployed.  Really wants to find a job, but has been difficult for her.  No children, not married    FAMILY HISTORY: Family History  Problem Relation Age of Onset  . Colon cancer Sister 70  . Breast cancer Mother 63  . Diabetes Sister 32  . Diabetes Sister 97  . Lung cancer Father   . Anesthesia problems Neg Hx   . Hypotension Neg Hx   . Malignant hyperthermia Neg Hx   . Pseudochol deficiency Neg Hx     REVIEW OF SYSTEMS:  Left nasal drainage, otherwise negative x 10 systems   PHYSICAL EXAM:  GENERAL:  NAD VITAL SIGNS:  There were no vitals filed for this visit.  SKIN:  Warm, dry HEENT: Mallampati class III, right septal deviation NECK:  supple LYMPH:  No LAD LUNGS:  clear CARDIOVASCULAR:  RRR ABDOMEN:  soft MUSCULOSKELETAL:  normal strength PSYCH:  appropriate NEUROLOGIC:  Cn 2-12 intact  DIAGNOSTIC STUDIES: none  ASSESSMENT AND PLAN: Plan to proceed with left nasal endoscopy with debridement and topical Flourescein. Patient understands the risks, benefits, and alternatives.  03/03/12 6:25 AM [name]

## 2012-03-03 NOTE — Anesthesia Preprocedure Evaluation (Signed)
Anesthesia Evaluation  Patient identified by MRN, date of birth, ID band Patient awake    Reviewed: Allergy & Precautions, H&P , NPO status , Patient's Chart, lab work & pertinent test results  Airway Mallampati: II  Neck ROM: full    Dental   Pulmonary Current Smoker,          Cardiovascular hypertension,     Neuro/Psych  Headaches, PSYCHIATRIC DISORDERS Anxiety CVA    GI/Hepatic   Endo/Other  Diabetes mellitus-Morbid obesity  Renal/GU      Musculoskeletal  (+) Arthritis -,   Abdominal   Peds  Hematology   Anesthesia Other Findings   Reproductive/Obstetrics                           Anesthesia Physical Anesthesia Plan  ASA: III  Anesthesia Plan: General   Post-op Pain Management:    Induction: Intravenous  Airway Management Planned: Oral ETT  Additional Equipment:   Intra-op Plan:   Post-operative Plan: Extubation in OR  Informed Consent: I have reviewed the patients History and Physical, chart, labs and discussed the procedure including the risks, benefits and alternatives for the proposed anesthesia with the patient or authorized representative who has indicated his/her understanding and acceptance.     Plan Discussed with: CRNA and Surgeon  Anesthesia Plan Comments:         Anesthesia Quick Evaluation

## 2012-03-03 NOTE — Transfer of Care (Signed)
Immediate Anesthesia Transfer of Care Note  Patient: [name]  Procedure(s) Performed: Procedure(s) (LRB): NASAL ENDOSCOPY WITH EPISTAXIS CONTROL (Left)  Patient Location: PACU  Anesthesia Type: General  Level of Consciousness: awake and patient cooperative  Airway & Oxygen Therapy: Patient Spontanous Breathing and Patient connected to face mask oxygen  Post-op Assessment: Report given to PACU RN and Post -op Vital signs reviewed and stable  Post vital signs: Reviewed and stable  Complications: no apparent anesthesia complications

## 2012-03-03 NOTE — Op Note (Signed)
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  SPECIMENS: left sinus contents FINDINGS: left maxillary sinus mucous retention cyst marsupialized and removed. Left middle turbinate mucosal graft in place in left lateral sphenoid with Fluorescein positive CSF extravasation from around the periphery of the graft at the previous repair site INDICATIONS: The patient is a 56yo with a history of a left lateral sphenoid/Sternberg's canal encephalocele s/p resection and repair with a left middle turbinate mucosal graft. She is several weeks post-op but has noted some clear rhinorrhea from the bilateral nasal cavities and is taken back to the OR today for endoscopy, debridement of her absorbable packing from her previous surgery, and topical Fluorescein to evaluate for a recurrent leak. DESCRIPTION OF OPERATION: The patient was brought to the operating room and was placed in the supine position and intubated using the glide scope and placed under general anesthesia by anesthesiology. The nasal cavity was decongested with topical Afrin pledgets which were then removed. The right nasal cavity was noted to have a severe, obstructive septal deviation as noted previously. On the left the maxillary sinus was noted to have a large mucous retention cyst that was decompressed and marsupialized using the straight and 45 degree Blakesley forceps. The mucosa of the left maxillary and left ethmoid cavities had some polypoid edema but the cavities were patent including the natural ostium of the left maxillary sinus. I used the suction and straight  Blakesley forceps to remove and debride the old nasopore and surgicel packing material from her previous surgery. The left lateral sphenoid showed a healed left middle turbinate mucosal graft from the surgically excised middle turbinate from her previous procedure, but application of topical Fluorescein showed immediate extravasation of green CSF from several areas at the inferior, medial, and superior aspects of the mucosal graft. I irrigated the left sinus cavities and suctioned them thoroughly, and then placed topical Duraseal glue in the left sphenoid over the mucosal graft and recurrent CSF leak. The patient was then turned back to anesthesia, awakened from anesthesia, and extubated without difficulty. The patient tolerated the procedure well with no immediate complications and was taken to the postoperative recovery area in good condition.  Dr. Ruby Cola was present and performed the entire procedure. 03/03/12 2:32 PM Ruby Cola

## 2012-03-03 NOTE — Preoperative (Signed)
Beta Blockers   Reason not to administer Beta Blockers:Not Applicable 

## 2012-03-03 NOTE — Discharge Instructions (Signed)
OTC tylenol or Ibuprofen PRN pain. Dr. Simeon Craft will set up outpatient referral to [facility] for recurrent CSF leak.

## 2012-03-03 NOTE — Anesthesia Procedure Notes (Signed)
Procedure Name: Intubation Date/Time: 03/03/2012 1:27 PM Performed by: Mariea Clonts Pre-anesthesia Checklist: Patient identified, Timeout performed, Emergency Drugs available, Suction available and Patient being monitored Patient Re-evaluated:Patient Re-evaluated prior to inductionOxygen Delivery Method: Circle system utilized Preoxygenation: Pre-oxygenation with 100% oxygen Intubation Type: IV induction Ventilation: Mask ventilation without difficulty and Oral airway inserted - appropriate to patient size Tube type: Oral Tube size: 7.5 mm Number of attempts: 1 Airway Equipment and Method: Video-laryngoscopy Placement Confirmation: positive ETCO2 and breath sounds checked- equal and bilateral Secured at: 21 cm Tube secured with: Tape Dental Injury: Teeth and Oropharynx as per pre-operative assessment  Difficulty Due To: Difficulty was anticipated Future Recommendations: Recommend- induction with short-acting agent, and alternative techniques readily available

## 2012-03-04 ENCOUNTER — Ambulatory Visit (INDEPENDENT_AMBULATORY_CARE_PROVIDER_SITE_OTHER): Payer: Self-pay | Admitting: Family Medicine

## 2012-03-04 VITALS — BP 163/77 | HR 83 | Temp 98.2°F | Ht 63.0 in | Wt 256.8 lb

## 2012-03-04 DIAGNOSIS — G9601 Cranial cerebrospinal fluid leak, spontaneous: Secondary | ICD-10-CM

## 2012-03-04 DIAGNOSIS — Z23 Encounter for immunization: Secondary | ICD-10-CM

## 2012-03-04 DIAGNOSIS — N201 Calculus of ureter: Secondary | ICD-10-CM

## 2012-03-04 DIAGNOSIS — E119 Type 2 diabetes mellitus without complications: Secondary | ICD-10-CM

## 2012-03-04 DIAGNOSIS — H538 Other visual disturbances: Secondary | ICD-10-CM

## 2012-03-04 DIAGNOSIS — I10 Essential (primary) hypertension: Secondary | ICD-10-CM

## 2012-03-04 NOTE — Patient Instructions (Signed)
Thank you for coming in today, it was good to see you Your kidney function looks better, I would like for you to restart your losartan, I will [facility] this [facility] to the [facility] I want you to stop in next week to have lab work done Keep a log of your sugars for the next couple of weeks, I will see you back in 2-3 weeks to review your blood pressure and diabetes control For your stones be sure to drink plenty of water, this will help keep you hydrated and help with stone prevention.   Keep me updated about your surgery if you have it before I see you again.  Call with questions, have a great day.

## 2012-03-08 ENCOUNTER — Other Ambulatory Visit: Payer: Self-pay

## 2012-03-08 DIAGNOSIS — I10 Essential (primary) hypertension: Secondary | ICD-10-CM

## 2012-03-08 LAB — BASIC METABOLIC PANEL
BUN: 20 mg/dL (ref 6–23)
CO2: 30 mEq/L (ref 19–32)
Calcium: 9.1 mg/dL (ref 8.4–10.5)
Chloride: 99 mEq/L (ref 96–112)
Creat: 1.11 mg/dL — ABNORMAL HIGH (ref 0.50–1.10)
Glucose, Bld: 206 mg/dL — ABNORMAL HIGH (ref 70–99)
Potassium: 3.7 mEq/L (ref 3.5–5.3)
Sodium: 140 mEq/L (ref 135–145)

## 2012-03-08 NOTE — Progress Notes (Signed)
Bmp done today Willett Lefeber 

## 2012-03-12 ENCOUNTER — Encounter: Payer: Self-pay | Admitting: Family Medicine

## 2012-03-16 MED ORDER — LOSARTAN POTASSIUM 25 MG PO TABS: 25.0000 mg | ORAL_TABLET | Freq: Every day | ORAL | Status: DC

## 2012-03-16 NOTE — Assessment & Plan Note (Signed)
Improved but still bothers her at times.  Will call Dr. Baird Cancer office to see if appt can be moved up earlier.

## 2012-03-16 NOTE — Assessment & Plan Note (Signed)
Multiple small stones on CT.  Should be able to pass or may have already passed (sediment in toilet), suspect uric acid stones given recent gout and uric acid crystals on UA.  Restarting losartan may help with this some, advised continued hydration.

## 2012-03-16 NOTE — Assessment & Plan Note (Signed)
Becoming more compliant, reported sugars improved from previous.  Advised to record glucos

## 2012-03-16 NOTE — Progress Notes (Signed)
  Subjective:    Patient ID: [name], female    DOB: 18-Nov-1956, 56 y.o.   MRN: [id number]  HPI  1. HTN:  Taking norvasc only.  Pressures not well controlled. HCTZ dc'ed due to gout in hospital and losartan held due to ARF in hospital.  Not checking bp at home.  Avoiding extra salt.  Still having some some episodes of blurred vision, although this is improved significantly.  She denies chest pain, shortness of breath, headache, palpitations.  2.  Ureteral stones:  Having back and flank pain as well as hematuria at last visit.  CT with multiple small ureteral stones.  Pain improved now, no further hematuria.  Has been straining urine.  Has not seen large stones but has seen a lot of sediment in bottom of toilet.  Denies dysuria, fever, chills.     3.  DM:  Checking sugars sporadically.  189 when she checked yesterday.  Has not checked today.  No problems with lantus.  No symptoms of hypoglycemia.    4.  CSF rhinorrhea: Had recent endoscopy by Dr. Simeon Craft again that confirms re leakage of CSF.  Plan to send to [facility] for further treatment at this point.  As above still with blurred vision, is supposed to f/u with Dr. Baird Cancer around April 25 she says.   Review of Systems     Objective:   Physical Exam  Constitutional: She is oriented to person, place, and time. No distress.       Obese  HENT:  Head: Normocephalic and atraumatic.  Eyes: EOM are normal. Pupils are equal, round, and reactive to light.       Unable to visualize fundus.  Neck: Neck supple. No thyromegaly present.  Cardiovascular: Regular rhythm, normal heart sounds and normal pulses.   Pulmonary/Chest: Effort normal and breath sounds normal.  Abdominal: She exhibits no distension. There is no tenderness.       No CVA tenderness.  Musculoskeletal:       Trace edema  Neurological: She is alert and oriented to person, place, and time.  Psychiatric: She has a normal mood and affect. Her behavior is normal.            Assessment & Plan:

## 2012-03-16 NOTE — Assessment & Plan Note (Signed)
Re-Leak per Dr. Theressa Millard notes. Plan to send to [facility] for further tx.

## 2012-03-16 NOTE — Assessment & Plan Note (Signed)
BP still elevated.  Last Cr improved will start her back on her losartan.  I will have her return in one week to recheck her Cr on her losartan and f/u in 2 weeks to recheck her BP

## 2012-03-17 ENCOUNTER — Telehealth: Payer: Self-pay | Admitting: Family Medicine

## 2012-03-17 ENCOUNTER — Ambulatory Visit (INDEPENDENT_AMBULATORY_CARE_PROVIDER_SITE_OTHER): Payer: Self-pay | Admitting: Family Medicine

## 2012-03-17 ENCOUNTER — Encounter: Payer: Self-pay | Admitting: Family Medicine

## 2012-03-17 VITALS — BP 138/84 | HR 79 | Ht 62.0 in | Wt 248.0 lb

## 2012-03-17 DIAGNOSIS — N949 Unspecified condition associated with female genital organs and menstrual cycle: Secondary | ICD-10-CM

## 2012-03-17 DIAGNOSIS — R102 Pelvic and perineal pain: Secondary | ICD-10-CM

## 2012-03-17 MED ORDER — TRAMADOL HCL 50 MG PO TABS: 50.0000 mg | ORAL_TABLET | Freq: Three times a day (TID) | ORAL | Status: DC | PRN

## 2012-03-17 NOTE — Progress Notes (Signed)
  Subjective:    Patient ID: [name], female    DOB: 04-Sep-1956, 56 y.o.   MRN: [id number]  HPI  Patient presents for same day appointment: low back and pelvic pain.  She was told by PCP that she has kidney stones and actually has an appointment with PCP tomorrow.  Patient complains of pain that has not been controlled by Tylenol OTC.  Pain is located at low back and pelvis.  Denies any flank pain, dysuria, fever, chills, NS, nausea or vomiting, hematuria.  Patient will bring stone/specimen to PCP tomorrow.     Review of Systems  PER HPI    Objective:   Physical Exam  Constitutional: No distress.  Cardiovascular: Normal rate.   Pulmonary/Chest: Effort normal.  Abdominal: Soft. Bowel sounds are normal. She exhibits no distension. There is tenderness. There is no rebound and no guarding.       Mild TTP lower pelvic area.  No TTP flanks bilaterally.  Skin: Skin is warm and dry.          Assessment & Plan:

## 2012-03-17 NOTE — Telephone Encounter (Signed)
Per Dr Zigmund Daniel pt appt w/opthal moved up to 4/15 at 9:20AM. Pt notified.

## 2012-03-17 NOTE — Patient Instructions (Signed)
It was nice to see you again. Please go to [facility] pharmacy and pick up Tramadol. Take one tablet every 8 hours as needed for pain. It may cause drowsiness so do not drive or operate heavy machinery. If pain does not improve, please call MD or discuss with PCP tomorrow. If symptoms worsen, please call MD or go to Emergency Room.

## 2012-03-17 NOTE — Telephone Encounter (Signed)
Advised patient to come today for 1:45 appointment.  Appointment tomorrow cancelled.

## 2012-03-17 NOTE — Assessment & Plan Note (Signed)
Secondary to passing kidney stones. Start Tramadol 50 mg one tab every 8 hr as needed.  May cause drowsiness. Advised patient to take Tramadol and call before 5 PM if it does not work. Okay to take Tylenol in addition to Tramadol for breakthrough pain. Follow up with PCP tomorrow morning.

## 2012-03-17 NOTE — Telephone Encounter (Signed)
Has kidney stones and is in a lot of pain- has an appt tomorrow AM but is a lot of pain today and wants to know what she can do.

## 2012-03-18 ENCOUNTER — Ambulatory Visit: Payer: Self-pay | Admitting: Family Medicine

## 2012-03-18 ENCOUNTER — Encounter: Payer: Self-pay | Admitting: Family Medicine

## 2012-03-18 ENCOUNTER — Ambulatory Visit (INDEPENDENT_AMBULATORY_CARE_PROVIDER_SITE_OTHER): Payer: Self-pay | Admitting: Family Medicine

## 2012-03-18 VITALS — BP 153/84 | HR 80 | Ht 62.0 in | Wt 250.0 lb

## 2012-03-18 DIAGNOSIS — I10 Essential (primary) hypertension: Secondary | ICD-10-CM

## 2012-03-18 DIAGNOSIS — N201 Calculus of ureter: Secondary | ICD-10-CM

## 2012-03-18 DIAGNOSIS — G9601 Cranial cerebrospinal fluid leak, spontaneous: Secondary | ICD-10-CM

## 2012-03-18 DIAGNOSIS — E119 Type 2 diabetes mellitus without complications: Secondary | ICD-10-CM

## 2012-03-18 MED ORDER — AMLODIPINE BESYLATE 10 MG PO TABS: 10.0000 mg | ORAL_TABLET | Freq: Every day | ORAL | Status: DC

## 2012-03-18 NOTE — Assessment & Plan Note (Signed)
Control sub-optimal.  Will have her increase lantus by 1 unit each day that glucose is >150.  Record sugars and return in 3-4 weeks.

## 2012-03-18 NOTE — Patient Instructions (Signed)
Thank you for coming in today, it was good to see you We will send your stones off for analysis, I will let you know when I get the results of that Be sure to pick up your blood pressure [facility] at the [facility] I will see you back in 3-4 (Try to come in the week before your surgery) weeks to see how your blood pressure is doing on this. For your insuling I want you to increase your lantus by 1 unit each day that your blood sugar is more than 150.

## 2012-03-18 NOTE — Assessment & Plan Note (Signed)
Bp elevated, did not pick up losartan previously.  Will have her restart this and return 3-4 weeks to recheck pressure.

## 2012-03-18 NOTE — Assessment & Plan Note (Signed)
Seems to be passing multiple stones.  Will send sample she brings in for stone analysis.  Continue tramadol for pain control.  On CCB, may help in passing.  Losartan may help some if this is uric acid.

## 2012-03-18 NOTE — Progress Notes (Signed)
  Subjective:    Patient ID: [name], female    DOB: Oct 23, 1956, 56 y.o.   MRN: [id number]  HPI  1. DM:  Has been recording sugars at home, but did not bring with her today.  From recall the highest she had in the past two weeks was 248, after eating a donut.  Lowest 168, she averages around 200 fasting.  Compliant with lantus.  Still finding it difficult to make dietary changes and exercise.  2. HTN:  BP remains elevated.  Never picked up losartan.  Has wrist cuff that she checks BP at home sometimes, typically around 140/80-90 range at home.  Denies chest pain, palpitations, headache, shortness of breath.   3.  Pelvic pain:  Was seen yesterday by Dr. Francesco Sor for pelvic pain, thought to be continued pain from kidney stones.  Brings sample of sediment from strainer in today.  Had some hematuria this morning.  Tramadol rx for pain yesterday, helping some.  Denies fever/chills.      Review of Systems     Objective:   Physical Exam  Constitutional:       Obese, nad   Cardiovascular: Normal rate, regular rhythm and normal heart sounds.   Pulmonary/Chest: Effort normal and breath sounds normal.  Abdominal: Soft. There is no tenderness.       No CVA or flank tenderness.   Musculoskeletal: She exhibits no edema.          Assessment & Plan:

## 2012-03-23 LAB — STONE ANALYSIS: Stone Weight KSTONE: 0.022 g

## 2012-03-24 ENCOUNTER — Telehealth: Payer: Self-pay | Admitting: Family Medicine

## 2012-03-24 NOTE — Telephone Encounter (Signed)
Pt had bad reaction to Lipitor.  Was severely dizzy to the point where she almost passed out.  Stopped taking med Monday night and felt better.  Checked her bp while she was taking and it was 200/88.  When she checked today it was 148/75.  Need you to call her to discuss this and change to a different med.

## 2012-03-26 NOTE — Telephone Encounter (Signed)
Patient is calling back wanting to know what Dr. Zigmund Daniel wants her to do about her dizzy spells and her BP.

## 2012-03-26 NOTE — Telephone Encounter (Signed)
Called patient and told her to continue to hold lipitor until i see her on 4/29.  No further sx since discontinuation.

## 2012-04-05 ENCOUNTER — Ambulatory Visit (INDEPENDENT_AMBULATORY_CARE_PROVIDER_SITE_OTHER): Payer: Self-pay | Admitting: Family Medicine

## 2012-04-05 ENCOUNTER — Encounter: Payer: Self-pay | Admitting: Family Medicine

## 2012-04-05 VITALS — BP 158/95 | HR 88 | Ht 62.0 in | Wt 250.4 lb

## 2012-04-05 DIAGNOSIS — I10 Essential (primary) hypertension: Secondary | ICD-10-CM

## 2012-04-05 DIAGNOSIS — M545 Low back pain, unspecified: Secondary | ICD-10-CM

## 2012-04-05 DIAGNOSIS — M79605 Pain in left leg: Secondary | ICD-10-CM

## 2012-04-05 DIAGNOSIS — E119 Type 2 diabetes mellitus without complications: Secondary | ICD-10-CM

## 2012-04-05 DIAGNOSIS — N201 Calculus of ureter: Secondary | ICD-10-CM

## 2012-04-05 NOTE — Patient Instructions (Signed)
Thank you for coming in today, it was good to see you Please call the health department to see if they have losartan for you. I would like for you to increase your lantus to 50 units daily. I will plan to see you back a few weeks after your surgery. I hope your surgery goes well, keep me updated.  You can call our office or Email me at [email or web address]

## 2012-04-06 NOTE — Assessment & Plan Note (Signed)
BP still not controlled.  Told to pick up losartan at [facility], call and let me know if I need to send again.

## 2012-04-06 NOTE — Assessment & Plan Note (Signed)
Stone analysis with uric acid stones.  Symptoms improved. Can consider addition of allopurinol if symptoms return or persist.  Hopefully losartan will help some as well.

## 2012-04-06 NOTE — Assessment & Plan Note (Signed)
Still not optimally controlled.  Will have her increase lantus to 50units.

## 2012-04-06 NOTE — Progress Notes (Signed)
  Subjective:    Patient ID: [name], female    DOB: 1956-07-28, 56 y.o.   MRN: [id number]  HPI 1. HTN:  BP still elevated.  Checks once and a while at home with wrist cuff.  Typically in the 123456 systolic.  Still has not picked up losartan from [facility].    2. DM:  Has been checking sugars more regularly.  Averaging around 200.  Up to 46 units of lantus daily.  No lows.  Tolerating meds well.  Has cut back on sodas and juices, triyng to drink more water.    2. Renal stones:  Stone analysis came back with Uric acid stones.  Cramping symptoms improved but still with intermittent hematuria.    Review of Systems     Objective:   Physical Exam  Constitutional: She is oriented to person, place, and time.       Obese, NAD  Neck: Neck supple.  Cardiovascular: Normal rate, regular rhythm and normal heart sounds.   Pulmonary/Chest: Effort normal and breath sounds normal.  Abdominal:       No CVA tenderness   Musculoskeletal: She exhibits no edema.  Neurological: She is alert and oriented to person, place, and time.          Assessment & Plan:

## 2012-04-12 ENCOUNTER — Other Ambulatory Visit: Payer: Self-pay

## 2012-04-12 ENCOUNTER — Other Ambulatory Visit: Payer: Self-pay | Admitting: *Deleted

## 2012-04-12 DIAGNOSIS — G9601 Cranial cerebrospinal fluid leak, spontaneous: Secondary | ICD-10-CM

## 2012-04-14 ENCOUNTER — Ambulatory Visit
Admission: RE | Admit: 2012-04-14 | Discharge: 2012-04-14 | Disposition: A | Discharge status: Home / Self Care | Payer: No Typology Code available for payment source | Source: Ambulatory Visit | Attending: *Deleted | Admitting: *Deleted

## 2012-04-14 DIAGNOSIS — G9601 Cranial cerebrospinal fluid leak, spontaneous: Secondary | ICD-10-CM

## 2012-04-14 MED ORDER — GADOBENATE DIMEGLUMINE 529 MG/ML IV SOLN
20.0000 mL | Freq: Once | INTRAVENOUS | Status: AC | PRN
  Administered 2012-04-14: 20 mL via INTRAVENOUS

## 2012-04-27 ENCOUNTER — Telehealth: Payer: Self-pay | Admitting: *Deleted

## 2012-04-27 ENCOUNTER — Other Ambulatory Visit: Payer: Self-pay | Admitting: Family Medicine

## 2012-04-27 MED ORDER — INSULIN GLARGINE 100 UNIT/ML ~~LOC~~ SOLN: 50.0000 [IU] | Freq: Every day | SUBCUTANEOUS | Status: DC

## 2012-04-27 NOTE — Telephone Encounter (Signed)
Would prefer for her have losartan given history of gout. Losartan shown to help with gout where other ARB's no help or worsen. No current gout flare, but does have uric acid stones and would like to avoid gout flare.  Does not need colchicine at this time.

## 2012-04-27 NOTE — Telephone Encounter (Signed)
[facility] pharmacy calling because Rx was faxed to them for Cozaar 25mg .  They sent fax to our office that they are able to get other med for cheaper price (Diovan, Benicar, or Micardis).  They received fax on 04/19/12 that "patient has gout and hope Losartan will help."  Ok to switch to another or med or only have patient receive Cozaar.  [facility] is also able to get Colcrys for gout.  Will route note to Dr. Zigmund Daniel and call pharmacy back.  Nolene Ebbs, RN

## 2012-04-27 NOTE — Telephone Encounter (Addendum)
Returned call to [facility] and spoke with Helene Kelp.  Informed of note from Dr. Zigmund Daniel.  [facility] pharmacy does not have Losartan.  Patient will need to get Rx filled at another pharmacy and they will inform patient.  Contacted patient for pharmacy info to refill med to.  Patient states she does not have any income at this time and will not be able to afford any meds from another pharmacy.  Will inform Dr. Zigmund Daniel and call patient back.  Nolene Ebbs, RN

## 2012-04-28 ENCOUNTER — Telehealth: Payer: Self-pay | Admitting: Family Medicine

## 2012-04-28 MED ORDER — VALSARTAN 40 MG PO TABS: 40.0000 mg | ORAL_TABLET | Freq: Every day | ORAL | Status: DC

## 2012-04-28 NOTE — Telephone Encounter (Signed)
Starting her on an ARB other than losartan would be an ok option, I was just trying to see if losartan was available as this would be preferred with her gout.   Given her diabetes and HTN, I think she would benefit from ARB and the benefits for her diabetes and HTN outweigh the risks of another gout flare in my opinion.  Will send over rx for diovan to [facility].

## 2012-04-28 NOTE — Telephone Encounter (Signed)
Addended by: Luetta Nutting on: 04/28/2012 10:23 PM   Modules accepted: Orders

## 2012-04-28 NOTE — Telephone Encounter (Signed)
Patient is calling back to determine what was decided from her conversation with Tomasa Hosteller on 5/21.

## 2012-04-29 NOTE — Telephone Encounter (Signed)
Patient informed to check with [facility] for Diovan Rx.  Nolene Ebbs, RN

## 2012-05-05 ENCOUNTER — Ambulatory Visit: Payer: Self-pay | Admitting: Family Medicine

## 2012-05-06 ENCOUNTER — Emergency Department (HOSPITAL_COMMUNITY)
Admission: EM | Admit: 2012-05-06 | Discharge: 2012-05-06 | Disposition: A | Discharge status: Home / Self Care | Payer: Self-pay | Attending: Emergency Medicine | Admitting: Emergency Medicine

## 2012-05-06 ENCOUNTER — Encounter (HOSPITAL_COMMUNITY): Payer: Self-pay | Admitting: Emergency Medicine

## 2012-05-06 ENCOUNTER — Emergency Department (HOSPITAL_COMMUNITY): Payer: Self-pay

## 2012-05-06 DIAGNOSIS — N1330 Unspecified hydronephrosis: Secondary | ICD-10-CM | POA: Insufficient documentation

## 2012-05-06 DIAGNOSIS — E119 Type 2 diabetes mellitus without complications: Secondary | ICD-10-CM | POA: Insufficient documentation

## 2012-05-06 DIAGNOSIS — I10 Essential (primary) hypertension: Secondary | ICD-10-CM | POA: Insufficient documentation

## 2012-05-06 DIAGNOSIS — R109 Unspecified abdominal pain: Secondary | ICD-10-CM | POA: Insufficient documentation

## 2012-05-06 DIAGNOSIS — N201 Calculus of ureter: Secondary | ICD-10-CM | POA: Insufficient documentation

## 2012-05-06 DIAGNOSIS — E785 Hyperlipidemia, unspecified: Secondary | ICD-10-CM | POA: Insufficient documentation

## 2012-05-06 DIAGNOSIS — R110 Nausea: Secondary | ICD-10-CM | POA: Insufficient documentation

## 2012-05-06 DIAGNOSIS — Z8639 Personal history of other endocrine, nutritional and metabolic disease: Secondary | ICD-10-CM | POA: Insufficient documentation

## 2012-05-06 DIAGNOSIS — R61 Generalized hyperhidrosis: Secondary | ICD-10-CM | POA: Insufficient documentation

## 2012-05-06 DIAGNOSIS — Z79899 Other long term (current) drug therapy: Secondary | ICD-10-CM | POA: Insufficient documentation

## 2012-05-06 DIAGNOSIS — Z7982 Long term (current) use of aspirin: Secondary | ICD-10-CM | POA: Insufficient documentation

## 2012-05-06 DIAGNOSIS — Z8673 Personal history of transient ischemic attack (TIA), and cerebral infarction without residual deficits: Secondary | ICD-10-CM | POA: Insufficient documentation

## 2012-05-06 DIAGNOSIS — N200 Calculus of kidney: Secondary | ICD-10-CM

## 2012-05-06 DIAGNOSIS — Z862 Personal history of diseases of the blood and blood-forming organs and certain disorders involving the immune mechanism: Secondary | ICD-10-CM | POA: Insufficient documentation

## 2012-05-06 DIAGNOSIS — Z794 Long term (current) use of insulin: Secondary | ICD-10-CM | POA: Insufficient documentation

## 2012-05-06 LAB — CBC
HCT: 44.4 % (ref 36.0–46.0)
Hemoglobin: 14.5 g/dL (ref 12.0–15.0)
MCH: 30 pg (ref 26.0–34.0)
MCHC: 32.7 g/dL (ref 30.0–36.0)
MCV: 91.7 fL (ref 78.0–100.0)
Platelets: 286 10*3/uL (ref 150–400)
RBC: 4.84 MIL/uL (ref 3.87–5.11)
RDW: 14.5 % (ref 11.5–15.5)
WBC: 12.1 10*3/uL — ABNORMAL HIGH (ref 4.0–10.5)

## 2012-05-06 LAB — URINALYSIS, ROUTINE W REFLEX MICROSCOPIC
Glucose, UA: NEGATIVE mg/dL
Ketones, ur: NEGATIVE mg/dL
Leukocytes, UA: NEGATIVE
Nitrite: NEGATIVE
Protein, ur: 30 mg/dL — AB
Specific Gravity, Urine: 1.024 (ref 1.005–1.030)
Urobilinogen, UA: 0.2 mg/dL (ref 0.0–1.0)
pH: 5.5 (ref 5.0–8.0)

## 2012-05-06 LAB — DIFFERENTIAL
Basophils Absolute: 0 10*3/uL (ref 0.0–0.1)
Basophils Relative: 0 % (ref 0–1)
Eosinophils Absolute: 0.4 10*3/uL (ref 0.0–0.7)
Eosinophils Relative: 3 % (ref 0–5)
Lymphocytes Relative: 14 % (ref 12–46)
Lymphs Abs: 1.7 10*3/uL (ref 0.7–4.0)
Monocytes Absolute: 0.6 10*3/uL (ref 0.1–1.0)
Monocytes Relative: 5 % (ref 3–12)
Neutro Abs: 9.4 10*3/uL — ABNORMAL HIGH (ref 1.7–7.7)
Neutrophils Relative %: 78 % — ABNORMAL HIGH (ref 43–77)

## 2012-05-06 LAB — POCT I-STAT, CHEM 8
BUN: 23 mg/dL (ref 6–23)
Calcium, Ion: 1.1 mmol/L — ABNORMAL LOW (ref 1.12–1.32)
Chloride: 101 mEq/L (ref 96–112)
Creatinine, Ser: 1.4 mg/dL — ABNORMAL HIGH (ref 0.50–1.10)
Glucose, Bld: 263 mg/dL — ABNORMAL HIGH (ref 70–99)
HCT: 46 % (ref 36.0–46.0)
Hemoglobin: 15.6 g/dL — ABNORMAL HIGH (ref 12.0–15.0)
Potassium: 4.6 mEq/L (ref 3.5–5.1)
Sodium: 140 mEq/L (ref 135–145)
TCO2: 30 mmol/L (ref 0–100)

## 2012-05-06 LAB — URINE MICROSCOPIC-ADD ON

## 2012-05-06 MED ORDER — OXYCODONE-ACETAMINOPHEN 5-325 MG PO TABS: 1.0000 | ORAL_TABLET | ORAL | Status: AC | PRN

## 2012-05-06 MED ORDER — TAMSULOSIN HCL 0.4 MG PO CAPS: 0.4000 mg | ORAL_CAPSULE | Freq: Every day | ORAL | Status: DC

## 2012-05-06 NOTE — ED Provider Notes (Signed)
History     CSN: [id number]  Arrival date & time 05/06/12  1949   First MD Initiated Contact with Patient 05/06/12 2042      Chief Complaint  Patient presents with  . Back Pain    (Consider location/radiation/quality/duration/timing/severity/associated sxs/prior treatment) HPI Comments: Patient states, that she has acute onset of stabbing excruciating left flank pain, radiating to her suprapubic area.  That has now resolved.  The pain was so bad.  It made her diaphoretic and slightly, nauseated without vomiting.  She states 3-4 weeks, ago.  She was diagnosed with a kidney scan, by Dr. Zigmund Daniel after a urine sample was obtained, which showed hematuria.  She was told she had  multiple kidney stones with only the results of the urine sample.  She had no further imaging  Patient is a 56 y.o. female presenting with back pain. The history is provided by the patient.  Back Pain  This is a new problem. The current episode started less than 1 hour ago. The problem occurs rarely. The problem has been resolved. The pain is associated with no known injury. The quality of the pain is described as stabbing. Associated symptoms include abdominal pain. Pertinent negatives include no fever, no dysuria, no leg pain and no weakness. She has tried nothing for the symptoms. Risk factors include obesity.    Past Medical History  Diagnosis Date  . CVA (cerebral infarction) 03/29/2004  . HTN (hypertension)     takes Amlodipine daily  . Hyperlipidemia     takes Pravastatin daily  . Shortness of breath     with exertion  . Headache     related to CSF leakage  . Dizziness     related to CSF leakage  . Stroke 03/2004  . Arthritis     knuckles  . Hx of gout   . Diarrhea   . Urinary frequency   . Urinary urgency   . Diabetes mellitus   . Anxiety     doesn't take any meds for this  . Kidney stones     Past Surgical History  Procedure Date  . Nm myoview ltd 08/25/2011    Low risk study, no evidence  of ischemia, EF 44%  . Total abdominal hysterectomy 1994  . Sphenoidectomy 01/29/2012    Procedure: SPHENOIDECTOMY;  Surgeon: Ruby Cola, MD;  Location: [place];  Service: ENT;  Laterality: Left;  REPAIR OF LEFT SPHENOID    . Nasal sinus surgery 01/29/2012    Procedure: ENDOSCOPIC SINUS SURGERY WITH STEALTH;  Surgeon: Ruby Cola, MD;  Location: [facility] OR;  Service: ENT;  Laterality: N/A;    Family History  Problem Relation Age of Onset  . Colon cancer Sister 71  . Breast cancer Mother 71  . Diabetes Sister 43  . Diabetes Sister 48  . Lung cancer Father   . Anesthesia problems Neg Hx   . Hypotension Neg Hx   . Malignant hyperthermia Neg Hx   . Pseudochol deficiency Neg Hx     History  Substance Use Topics  . Smoking status: Current Everyday Smoker -- 0.2 packs/day for 35 years  . Smokeless tobacco: Not on file   Comment: has decreased smoking from 2ppd  . Alcohol Use: No    OB History    Grav Para Term Preterm Abortions TAB SAB Ect Mult Living                  Review of Systems  Constitutional: Negative for fever.  Gastrointestinal:  Positive for nausea and abdominal pain. Negative for vomiting.  Genitourinary: Positive for flank pain. Negative for dysuria.  Musculoskeletal: Positive for back pain.  Neurological: Negative for dizziness and weakness.    Allergies  Ace inhibitors and Vicodin  Home Medications   Current Outpatient Rx  Name Route Sig Dispense Refill  . ASPIRIN 325 MG PO TABS Oral Take 325 mg by mouth daily.    . ATORVASTATIN CALCIUM 10 MG PO TABS Oral Take 10 mg by mouth daily.    . INSULIN GLARGINE 100 UNIT/ML [place] SOLN Subcutaneous Inject 50 Units into the skin daily. 5 pen 6  . BACID PO TABS Oral Take 2 tablets by mouth 3 (three) times daily.    . OXYCODONE-ACETAMINOPHEN 5-325 MG PO TABS Oral Take 1 tablet by mouth every 4 (four) hours as needed. For pain    . TRAMADOL HCL 50 MG PO TABS Oral Take 1 tablet (50 mg total) by mouth every 8 (eight) hours  as needed. For pain. 30 tablet 0  . VALSARTAN 40 MG PO TABS Oral Take 1 tablet (40 mg total) by mouth daily. 30 tablet 4    BP 141/71  Pulse 90  Temp(Src) 97.8 F (36.6 C) (Oral)  Resp 20  SpO2 92%  Physical Exam  Constitutional: She is oriented to person, place, and time. She appears well-developed and well-nourished.  HENT:  Head: Normocephalic.  Eyes: Pupils are equal, round, and reactive to light.  Neck: Normal range of motion.  Cardiovascular: Normal rate.   Pulmonary/Chest: Effort normal.  Abdominal: Soft. Bowel sounds are normal. She exhibits no distension. There is no tenderness.  Musculoskeletal: Normal range of motion.  Neurological: She is alert and oriented to person, place, and time.  Skin: Skin is warm. No rash noted.    ED Course  Procedures (including critical care time)  Labs Reviewed - No data to display No results found.   No diagnosis found.    MDM   Mst likely a kidney stone due to symptoms of waxing and waning symptoms will obtain UA renal function as well as CT scan for complete evalaution         Garald Balding, NP 05/07/12 [phone]

## 2012-05-06 NOTE — Discharge Instructions (Signed)
Kidney Stones Kidney stones (ureteral lithiasis) are deposits that form inside your kidneys. The intense pain is caused by the stone moving through the urinary tract. When the stone moves, the ureter goes into spasm around the stone. The stone is usually passed in the urine.  CAUSES   A disorder that makes certain neck glands produce too much parathyroid hormone (primary hyperparathyroidism).   A buildup of uric acid crystals.   Narrowing (stricture) of the ureter.   A kidney obstruction present at birth (congenital obstruction).   Previous surgery on the kidney or ureters.   Numerous kidney infections.  SYMPTOMS   Feeling sick to your stomach (nauseous).   Throwing up (vomiting).   Blood in the urine (hematuria).   Pain that usually spreads (radiates) to the groin.   Frequency or urgency of urination.  DIAGNOSIS   Taking a history and physical exam.   Blood or urine tests.   Computerized X-ray scan (CT scan).   Occasionally, an examination of the inside of the urinary bladder (cystoscopy) is performed.  TREATMENT   Observation.   Increasing your fluid intake.   Surgery may be needed if you have severe pain or persistent obstruction.  The size, location, and chemical composition are all important variables that will determine the proper choice of action for you. Talk to your caregiver to better understand your situation so that you will minimize the risk of injury to yourself and your kidney.  HOME CARE INSTRUCTIONS   Drink enough water and fluids to keep your urine clear or pale yellow.   Strain all urine through the provided strainer. Keep all particulate matter and stones for your caregiver to see. The stone causing the pain may be as small as a grain of salt. It is very important to use the strainer each and every time you pass your urine. The collection of your stone will allow your caregiver to analyze it and verify that a stone has actually passed.   Only take  over-the-counter or prescription medicines for pain, discomfort, or fever as directed by your caregiver.   Make a follow-up appointment with your caregiver as directed.   Get follow-up X-rays if required. The absence of pain does not always mean that the stone has passed. It may have only stopped moving. If the urine remains completely obstructed, it can cause loss of kidney function or even complete destruction of the kidney. It is your responsibility to make sure X-rays and follow-ups are completed. Ultrasounds of the kidney can show blockages and the status of the kidney. Ultrasounds are not associated with any radiation and can be performed easily in a matter of minutes.  SEEK IMMEDIATE MEDICAL CARE IF:   Pain cannot be controlled with the prescribed medicine.   You have a fever.   The severity or intensity of pain increases over 18 hours and is not relieved by pain medicine.   You develop a new onset of abdominal pain.   You feel faint or pass out.  MAKE SURE YOU:   Understand these instructions.   Will watch your condition.   Will get help right away if you are not doing well or get worse.  Document Released: 11/24/2005 Document Revised: 11/13/2011 Document Reviewed: 03/22/2010 [facility] Patient Information 2012 [place]. You have a number of small, kidney stones on the left, please make appointment with Dr. Janice Norrie  for further evaluation and treatment

## 2012-05-06 NOTE — ED Notes (Signed)
PA Schulz at bedside. 

## 2012-05-06 NOTE — ED Notes (Signed)
Pt presented to the ER via EMS, pt c/o back pain that started about 30 min ago, pt also reports having kidney stones 3 weeks ago, however, this pain, do not resemble those sx. Initially pain level rates as 10/10, at this time 9/10, lower back, left side.

## 2012-05-08 NOTE — ED Provider Notes (Signed)
History/physical exam/procedure(s) were performed by non-physician practitioner and as supervising physician I was immediately available for consultation/collaboration. I have reviewed all notes and am in agreement with care and plan.   Shaune Pollack, MD 05/08/12 1248

## 2012-05-19 NOTE — Telephone Encounter (Signed)
Has this been addressed, if so, please close the encounter.

## 2012-05-26 ENCOUNTER — Ambulatory Visit (INDEPENDENT_AMBULATORY_CARE_PROVIDER_SITE_OTHER): Payer: Self-pay | Admitting: Family Medicine

## 2012-05-26 ENCOUNTER — Encounter: Payer: Self-pay | Admitting: Family Medicine

## 2012-05-26 VITALS — BP 110/76 | HR 92 | Ht 62.0 in | Wt 246.0 lb

## 2012-05-26 DIAGNOSIS — N201 Calculus of ureter: Secondary | ICD-10-CM

## 2012-05-26 DIAGNOSIS — E119 Type 2 diabetes mellitus without complications: Secondary | ICD-10-CM

## 2012-05-26 DIAGNOSIS — G9601 Cranial cerebrospinal fluid leak, spontaneous: Secondary | ICD-10-CM

## 2012-05-26 LAB — POCT GLYCOSYLATED HEMOGLOBIN (HGB A1C): Hemoglobin A1C: 8.3

## 2012-05-26 MED ORDER — PRAVASTATIN SODIUM 20 MG PO TABS
20.0000 mg | ORAL_TABLET | Freq: Every day | ORAL | Status: DC
Start: 2012-05-26 — End: 2012-05-31

## 2012-05-26 MED ORDER — INSULIN GLARGINE 100 UNIT/ML ~~LOC~~ SOLN: 60.0000 [IU] | Freq: Every day | SUBCUTANEOUS | Status: DC

## 2012-05-26 NOTE — Patient Instructions (Signed)
Thank you for coming in today, it was good to see you I am increasing your lantus to 60 units daily If your kidney function looks ok we can start you on a medication to help with your stones. I will see you back in one month

## 2012-05-27 LAB — BASIC METABOLIC PANEL
BUN: 18 mg/dL (ref 6–23)
CO2: 31 mEq/L (ref 19–32)
Calcium: 10.3 mg/dL (ref 8.4–10.5)
Chloride: 96 mEq/L (ref 96–112)
Creat: 1.55 mg/dL — ABNORMAL HIGH (ref 0.50–1.10)
Glucose, Bld: 211 mg/dL — ABNORMAL HIGH (ref 70–99)
Potassium: 4.3 mEq/L (ref 3.5–5.3)
Sodium: 140 mEq/L (ref 135–145)

## 2012-05-31 ENCOUNTER — Telehealth: Payer: Self-pay | Admitting: *Deleted

## 2012-05-31 ENCOUNTER — Other Ambulatory Visit: Payer: Self-pay | Admitting: Family Medicine

## 2012-05-31 DIAGNOSIS — E119 Type 2 diabetes mellitus without complications: Secondary | ICD-10-CM

## 2012-05-31 MED ORDER — PRAVASTATIN SODIUM 20 MG PO TABS: 20.0000 mg | ORAL_TABLET | Freq: Every day | ORAL | Status: DC

## 2012-05-31 MED ORDER — INSULIN GLARGINE 100 UNIT/ML ~~LOC~~ SOLN: 60.0000 [IU] | Freq: Every day | SUBCUTANEOUS | Status: DC

## 2012-05-31 NOTE — Telephone Encounter (Signed)
Could not tolerate lipitor, changed to pravastatin.

## 2012-05-31 NOTE — Telephone Encounter (Signed)
[name] from the MAP program called and would like to get clarification of pt's statins: Pt received Lipitor 10 mg # 67 in April and now has an Rx for Pravachol. Was it changed by the PCP? Please call [name] to clarify .[name] I told [name], that I see Rx for Pravachol in chart.

## 2012-06-01 NOTE — Telephone Encounter (Signed)
Called [place] and informed. [name], [name]

## 2012-06-05 NOTE — Assessment & Plan Note (Signed)
Uric acid stones on analysis, if Cr looks on bmet will plan to start on allopurinol.

## 2012-06-05 NOTE — Progress Notes (Signed)
  Subjective:    Patient ID: [name], female    DOB: 1956/04/10, 56 y.o.   MRN: [id number]  HPI  1. DM:  Poorly controlled in the past.  Has been using insulin as directed, 50 units daily. Glucose averaging around 200 with a high of 230 and a  Low of 175.  States that she did finally start on valsartan.  She has still had episodes of blurred vision that come and go. Saw Dr. Baird Cancer recently but not since blurred vision started.   No increased thirst or urination.    2. Gout:  No recent gout attacks but still having stones.  Previous stone analysis with uric acid stones.  Currently not on any medication to control uric acid.  She denies any joint pain, swelling.    3. CSF Leak:  Was supposed to have 2nd repair of CSF leak at [facility] but had to reschedule due to scheduling conflicts.. Over the past two weeks she denies any rhinorrhea as she was having before.     Review of Systems Per HPI    Objective:   Physical Exam  Constitutional:       Morbidly obese, nad  HENT:  Head: Normocephalic and atraumatic.       No rhinorrhea observerd  Cardiovascular: Normal rate and regular rhythm.   Musculoskeletal:       No joint tenderness or swelling.           Assessment & Plan:

## 2012-06-05 NOTE — Assessment & Plan Note (Signed)
Stable, is supposed to have a repeat repair at [facility]

## 2012-06-05 NOTE — Assessment & Plan Note (Signed)
Better control from previous, but I still think she would benefit from increased insulin.  Will have her up lantus to 60 units daily.  Is taking ARB now.  Check BMET.

## 2012-06-14 ENCOUNTER — Other Ambulatory Visit: Payer: Self-pay | Admitting: Family Medicine

## 2012-06-14 MED ORDER — INSULIN DETEMIR 100 UNIT/ML ~~LOC~~ SOLN: 60.0000 [IU] | SUBCUTANEOUS | Status: DC

## 2012-06-24 ENCOUNTER — Encounter: Payer: Self-pay | Admitting: Family Medicine

## 2012-06-24 ENCOUNTER — Ambulatory Visit (INDEPENDENT_AMBULATORY_CARE_PROVIDER_SITE_OTHER): Payer: Self-pay | Admitting: Family Medicine

## 2012-06-24 VITALS — BP 168/89 | HR 87 | Temp 97.3°F | Ht 62.0 in | Wt 251.0 lb

## 2012-06-24 DIAGNOSIS — I10 Essential (primary) hypertension: Secondary | ICD-10-CM

## 2012-06-24 DIAGNOSIS — E119 Type 2 diabetes mellitus without complications: Secondary | ICD-10-CM

## 2012-06-24 DIAGNOSIS — N200 Calculus of kidney: Secondary | ICD-10-CM

## 2012-06-24 DIAGNOSIS — M62838 Other muscle spasm: Secondary | ICD-10-CM

## 2012-06-24 MED ORDER — INSULIN DETEMIR 100 UNIT/ML ~~LOC~~ SOLN: 60.0000 [IU] | SUBCUTANEOUS | Status: DC

## 2012-06-24 MED ORDER — PRAVASTATIN SODIUM 20 MG PO TABS: 20.0000 mg | ORAL_TABLET | Freq: Every day | ORAL | Status: DC

## 2012-06-24 MED ORDER — GABAPENTIN 300 MG PO CAPS: 300.0000 mg | ORAL_CAPSULE | Freq: Three times a day (TID) | ORAL | Status: DC

## 2012-06-24 MED ORDER — CYCLOBENZAPRINE HCL 10 MG PO TABS: 10.0000 mg | ORAL_TABLET | Freq: Three times a day (TID) | ORAL | Status: AC | PRN

## 2012-06-24 MED ORDER — ALLOPURINOL 100 MG PO TABS: 200.0000 mg | ORAL_TABLET | Freq: Every day | ORAL | Status: DC

## 2012-06-24 MED ORDER — VALSARTAN 80 MG PO TABS: 80.0000 mg | ORAL_TABLET | Freq: Every day | ORAL | Status: DC

## 2012-06-24 NOTE — Patient Instructions (Addendum)
Thank you for coming in today, it was good to see you I am adding a medication to help with the burning in your feet called gabapentin I am going to have you increase your diovan to 80mg  I am adding a medication for gout as well to help prevent your stones. I will see you back in one month to see how you are doing.

## 2012-06-27 ENCOUNTER — Emergency Department (HOSPITAL_COMMUNITY)
Admission: EM | Admit: 2012-06-27 | Discharge: 2012-06-28 | Disposition: A | Discharge status: Home / Self Care | Payer: Medicaid Other | Attending: Emergency Medicine | Admitting: Emergency Medicine

## 2012-06-27 DIAGNOSIS — R079 Chest pain, unspecified: Secondary | ICD-10-CM | POA: Insufficient documentation

## 2012-06-27 DIAGNOSIS — I10 Essential (primary) hypertension: Secondary | ICD-10-CM | POA: Insufficient documentation

## 2012-06-27 DIAGNOSIS — Z8673 Personal history of transient ischemic attack (TIA), and cerebral infarction without residual deficits: Secondary | ICD-10-CM | POA: Insufficient documentation

## 2012-06-27 DIAGNOSIS — Z794 Long term (current) use of insulin: Secondary | ICD-10-CM | POA: Insufficient documentation

## 2012-06-27 DIAGNOSIS — R209 Unspecified disturbances of skin sensation: Secondary | ICD-10-CM | POA: Insufficient documentation

## 2012-06-27 DIAGNOSIS — R5381 Other malaise: Secondary | ICD-10-CM | POA: Insufficient documentation

## 2012-06-27 DIAGNOSIS — N289 Disorder of kidney and ureter, unspecified: Secondary | ICD-10-CM | POA: Insufficient documentation

## 2012-06-27 DIAGNOSIS — E785 Hyperlipidemia, unspecified: Secondary | ICD-10-CM | POA: Insufficient documentation

## 2012-06-28 ENCOUNTER — Other Ambulatory Visit (HOSPITAL_COMMUNITY): Payer: Self-pay | Admitting: Emergency Medicine

## 2012-06-28 ENCOUNTER — Other Ambulatory Visit: Payer: Self-pay | Admitting: Emergency Medicine

## 2012-06-28 ENCOUNTER — Ambulatory Visit (HOSPITAL_COMMUNITY)
Admission: RE | Admit: 2012-06-28 | Discharge: 2012-06-28 | Disposition: A | Discharge status: Home / Self Care | Payer: Self-pay | Source: Ambulatory Visit | Attending: Emergency Medicine | Admitting: Emergency Medicine

## 2012-06-28 DIAGNOSIS — R079 Chest pain, unspecified: Secondary | ICD-10-CM

## 2012-06-28 DIAGNOSIS — R200 Anesthesia of skin: Secondary | ICD-10-CM

## 2012-06-28 DIAGNOSIS — R29898 Other symptoms and signs involving the musculoskeletal system: Secondary | ICD-10-CM | POA: Insufficient documentation

## 2012-06-28 DIAGNOSIS — J3489 Other specified disorders of nose and nasal sinuses: Secondary | ICD-10-CM | POA: Insufficient documentation

## 2012-06-28 LAB — URINE MICROSCOPIC-ADD ON

## 2012-06-28 LAB — CBC WITH DIFFERENTIAL/PLATELET
Basophils Absolute: 0 10*3/uL (ref 0.0–0.1)
Basophils Relative: 0 % (ref 0–1)
Eosinophils Absolute: 0.4 10*3/uL (ref 0.0–0.7)
Eosinophils Relative: 4 % (ref 0–5)
HCT: 43.2 % (ref 36.0–46.0)
Hemoglobin: 14 g/dL (ref 12.0–15.0)
Lymphocytes Relative: 25 % (ref 12–46)
Lymphs Abs: 2.6 10*3/uL (ref 0.7–4.0)
MCH: 29.8 pg (ref 26.0–34.0)
MCHC: 32.4 g/dL (ref 30.0–36.0)
MCV: 91.9 fL (ref 78.0–100.0)
Monocytes Absolute: 0.5 10*3/uL (ref 0.1–1.0)
Monocytes Relative: 5 % (ref 3–12)
Neutro Abs: 7 10*3/uL (ref 1.7–7.7)
Neutrophils Relative %: 67 % (ref 43–77)
Platelets: 232 10*3/uL (ref 150–400)
RBC: 4.7 MIL/uL (ref 3.87–5.11)
RDW: 14.6 % (ref 11.5–15.5)
WBC: 10.5 10*3/uL (ref 4.0–10.5)

## 2012-06-28 LAB — COMPREHENSIVE METABOLIC PANEL
ALT: 25 U/L (ref 0–35)
AST: 54 U/L — ABNORMAL HIGH (ref 0–37)
Albumin: 3.6 g/dL (ref 3.5–5.2)
Alkaline Phosphatase: 95 U/L (ref 39–117)
BUN: 20 mg/dL (ref 6–23)
CO2: 24 mEq/L (ref 19–32)
Calcium: 9.2 mg/dL (ref 8.4–10.5)
Chloride: 96 mEq/L (ref 96–112)
Creatinine, Ser: 1.28 mg/dL — ABNORMAL HIGH (ref 0.50–1.10)
GFR calc Af Amer: 54 mL/min — ABNORMAL LOW (ref >90)
GFR calc non Af Amer: 46 mL/min — ABNORMAL LOW (ref >90)
Glucose, Bld: 262 mg/dL — ABNORMAL HIGH (ref 70–99)
Potassium: 5.2 mEq/L — ABNORMAL HIGH (ref 3.5–5.1)
Sodium: 135 mEq/L (ref 135–145)
Total Bilirubin: 0.3 mg/dL (ref 0.3–1.2)
Total Protein: 7.6 g/dL (ref 6.0–8.3)

## 2012-06-28 LAB — CK: Total CK: 92 U/L (ref 7–177)

## 2012-06-28 LAB — URINALYSIS, ROUTINE W REFLEX MICROSCOPIC
Bilirubin Urine: NEGATIVE
Glucose, UA: 250 mg/dL — AB
Hgb urine dipstick: NEGATIVE
Ketones, ur: NEGATIVE mg/dL
Leukocytes, UA: NEGATIVE
Nitrite: NEGATIVE
Protein, ur: 100 mg/dL — AB
Specific Gravity, Urine: 1.024 (ref 1.005–1.030)
Urobilinogen, UA: 0.2 mg/dL (ref 0.0–1.0)
pH: 5.5 (ref 5.0–8.0)

## 2012-06-28 LAB — TROPONIN I: Troponin I: <0.3 ng/mL (ref <0.30)

## 2012-07-04 DIAGNOSIS — M62838 Other muscle spasm: Secondary | ICD-10-CM | POA: Insufficient documentation

## 2012-07-04 DIAGNOSIS — N200 Calculus of kidney: Secondary | ICD-10-CM | POA: Insufficient documentation

## 2012-07-04 NOTE — Assessment & Plan Note (Signed)
Spasm of upper trapezius.  No signs of radiculopathy at this time.  Will give trial of flexeril to see if this helps improve pain.

## 2012-07-04 NOTE — Progress Notes (Signed)
  Subjective:    Patient ID: [name], female    DOB: 11-Jan-1956, 56 y.o.   MRN: [id number]  HPI  1. DM:  Control not optimal in the past. She states she has been compliant with her medication, however getting her medications through the MAP program has been difficult.  States that since she has not filed taxes for a couple of years that they can not provide her lantus anymore because they need a letter from the IRS.  She did not bring a log of her glucose today.  States typical fasting glucose is below 200.  She is also complaining of increased numbness and burning in her feet.    2. Renal stones:  Previous stone analysis with uric acid stones.  Still with occasional blood in urine, back pain and passage of stones.  HCTZ stopped previously due to uric acid stones.  Tried to get her on losartan, however MAP program could not provide. She currently denies pain, dysuria, urinary frequency.   3. HTN:  Finally able to start diovan.  Does not monitor blood pressure at home.  Denies chest pain, palpitations, dizziness, shortness of breath, vision changes.   4. Back pain:  Pain and tightness in upper back between shoulder blades.  Described as muscle "cramping" in upper back.  No radiation of pain.   Denies numbness/weakness into arms. NO change in sensation  Review of Systems Per HPI    Objective:   Physical Exam  Constitutional:       Morbidly obese female, nad.   Cardiovascular: Normal rate and regular rhythm.   Pulmonary/Chest: Effort normal and breath sounds normal.  Abdominal:       Soft, no CVA tenderness.   Musculoskeletal: She exhibits edema (trace).       Foot exam:  Pulses 2+ bilaterally Normal to inspection Decreased sensation bilateral  R>L foot with monofilament testing of forefoot.            Assessment & Plan:

## 2012-07-04 NOTE — Assessment & Plan Note (Signed)
Still with small recurrent stones.  Start allopurinol

## 2012-07-04 NOTE — Assessment & Plan Note (Signed)
Switched from lantus to levemir due to MAP being unable to provide lantus due to tax filing status of patient.  No change in insulin regimen currently.  Start gabapentin to see if this helps with possible neuropathy.

## 2012-07-04 NOTE — Assessment & Plan Note (Addendum)
BP still elevated, increase diovan to 80 mg.  F/u with me in 8month.

## 2012-07-12 ENCOUNTER — Telehealth: Payer: Self-pay | Admitting: *Deleted

## 2012-07-12 ENCOUNTER — Other Ambulatory Visit: Payer: Self-pay | Admitting: Family Medicine

## 2012-07-12 ENCOUNTER — Telehealth: Payer: Self-pay | Admitting: Family Medicine

## 2012-07-12 MED ORDER — INSULIN DETEMIR 100 UNIT/ML ~~LOC~~ SOLN: 35.0000 [IU] | Freq: Two times a day (BID) | SUBCUTANEOUS | Status: DC

## 2012-07-12 NOTE — Telephone Encounter (Signed)
Called patient she says her blood sugar has been averaging in the 300's wants to know if she should increase her insulin, forward to PCP for advice.Busick, [name]

## 2012-07-12 NOTE — Telephone Encounter (Signed)
Called and informed patient of new dose of insulin and asked her to check her blood sugar and call us with the results on Wednesday. Patient voices understanding.Mackinsey Pelland, [name]

## 2012-07-12 NOTE — Telephone Encounter (Signed)
Patient is calling because she doesn't feel the new dose for the new insulin is enough and she wants to increase the dose and if that is ok, she will need [facility].

## 2012-07-12 NOTE — Progress Notes (Signed)
Please let patient know that insulin changed to BID dosing.  35 units BID is the new dose. Can start doing now with current insulin.  New rx will be faxed to [facility].  Call back  with glucose in two days.

## 2012-07-26 ENCOUNTER — Encounter (HOSPITAL_COMMUNITY): Payer: Self-pay | Admitting: General Practice

## 2012-07-26 ENCOUNTER — Inpatient Hospital Stay (HOSPITAL_COMMUNITY)
Admission: AD | Admit: 2012-07-26 | Discharge: 2012-07-27 | DRG: 065 | Disposition: A | Discharge status: Home / Self Care | Payer: MEDICAID | Source: Ambulatory Visit | Attending: Family Medicine | Admitting: Family Medicine

## 2012-07-26 ENCOUNTER — Other Ambulatory Visit: Payer: Self-pay

## 2012-07-26 ENCOUNTER — Ambulatory Visit (INDEPENDENT_AMBULATORY_CARE_PROVIDER_SITE_OTHER): Payer: Self-pay | Admitting: Family Medicine

## 2012-07-26 ENCOUNTER — Encounter: Payer: Self-pay | Admitting: Family Medicine

## 2012-07-26 VITALS — BP 182/95 | HR 75 | Ht 62.0 in | Wt 252.0 lb

## 2012-07-26 DIAGNOSIS — E119 Type 2 diabetes mellitus without complications: Secondary | ICD-10-CM

## 2012-07-26 DIAGNOSIS — H538 Other visual disturbances: Secondary | ICD-10-CM

## 2012-07-26 DIAGNOSIS — E785 Hyperlipidemia, unspecified: Secondary | ICD-10-CM

## 2012-07-26 DIAGNOSIS — R29898 Other symptoms and signs involving the musculoskeletal system: Secondary | ICD-10-CM | POA: Diagnosis present

## 2012-07-26 DIAGNOSIS — I10 Essential (primary) hypertension: Secondary | ICD-10-CM | POA: Diagnosis present

## 2012-07-26 DIAGNOSIS — R531 Weakness: Secondary | ICD-10-CM

## 2012-07-26 DIAGNOSIS — F17200 Nicotine dependence, unspecified, uncomplicated: Secondary | ICD-10-CM | POA: Diagnosis present

## 2012-07-26 DIAGNOSIS — G4733 Obstructive sleep apnea (adult) (pediatric): Secondary | ICD-10-CM | POA: Diagnosis present

## 2012-07-26 DIAGNOSIS — R4789 Other speech disturbances: Secondary | ICD-10-CM | POA: Diagnosis present

## 2012-07-26 DIAGNOSIS — I639 Cerebral infarction, unspecified: Secondary | ICD-10-CM | POA: Diagnosis present

## 2012-07-26 DIAGNOSIS — R51 Headache: Secondary | ICD-10-CM

## 2012-07-26 DIAGNOSIS — Z79899 Other long term (current) drug therapy: Secondary | ICD-10-CM

## 2012-07-26 DIAGNOSIS — F40240 Claustrophobia: Secondary | ICD-10-CM

## 2012-07-26 DIAGNOSIS — Z794 Long term (current) use of insulin: Secondary | ICD-10-CM | POA: Diagnosis present

## 2012-07-26 DIAGNOSIS — E1142 Type 2 diabetes mellitus with diabetic polyneuropathy: Secondary | ICD-10-CM | POA: Diagnosis present

## 2012-07-26 DIAGNOSIS — E782 Mixed hyperlipidemia: Secondary | ICD-10-CM | POA: Diagnosis present

## 2012-07-26 DIAGNOSIS — Z8673 Personal history of transient ischemic attack (TIA), and cerebral infarction without residual deficits: Secondary | ICD-10-CM

## 2012-07-26 DIAGNOSIS — M6281 Muscle weakness (generalized): Secondary | ICD-10-CM

## 2012-07-26 DIAGNOSIS — Z6841 Body Mass Index (BMI) 40.0 and over, adult: Secondary | ICD-10-CM

## 2012-07-26 DIAGNOSIS — R5381 Other malaise: Secondary | ICD-10-CM

## 2012-07-26 DIAGNOSIS — M109 Gout, unspecified: Secondary | ICD-10-CM | POA: Diagnosis present

## 2012-07-26 DIAGNOSIS — M19049 Primary osteoarthritis, unspecified hand: Secondary | ICD-10-CM | POA: Diagnosis present

## 2012-07-26 DIAGNOSIS — I6350 Cerebral infarction due to unspecified occlusion or stenosis of unspecified cerebral artery: Principal | ICD-10-CM | POA: Diagnosis present

## 2012-07-26 DIAGNOSIS — F411 Generalized anxiety disorder: Secondary | ICD-10-CM | POA: Diagnosis present

## 2012-07-26 HISTORY — DX: Type 2 diabetes mellitus without complications: E11.9

## 2012-07-26 HISTORY — DX: Chronic obstructive pulmonary disease, unspecified: J44.9

## 2012-07-26 HISTORY — DX: Claustrophobia: F40.240

## 2012-07-26 HISTORY — DX: Unspecified asthma, uncomplicated: J45.909

## 2012-07-26 HISTORY — DX: Obstructive sleep apnea (adult) (pediatric): G47.33

## 2012-07-26 LAB — COMPREHENSIVE METABOLIC PANEL
ALT: 16 U/L (ref 0–35)
AST: 24 U/L (ref 0–37)
Albumin: 3.7 g/dL (ref 3.5–5.2)
Alkaline Phosphatase: 110 U/L (ref 39–117)
BUN: 19 mg/dL (ref 6–23)
CO2: 29 mEq/L (ref 19–32)
Calcium: 9.3 mg/dL (ref 8.4–10.5)
Chloride: 97 mEq/L (ref 96–112)
Creatinine, Ser: 1.21 mg/dL — ABNORMAL HIGH (ref 0.50–1.10)
GFR calc Af Amer: 57 mL/min — ABNORMAL LOW (ref >90)
GFR calc non Af Amer: 49 mL/min — ABNORMAL LOW (ref >90)
Glucose, Bld: 256 mg/dL — ABNORMAL HIGH (ref 70–99)
Potassium: 3.9 mEq/L (ref 3.5–5.1)
Sodium: 138 mEq/L (ref 135–145)
Total Bilirubin: 0.4 mg/dL (ref 0.3–1.2)
Total Protein: 7.5 g/dL (ref 6.0–8.3)

## 2012-07-26 LAB — CBC
HCT: 44.5 % (ref 36.0–46.0)
Hemoglobin: 14.8 g/dL (ref 12.0–15.0)
MCH: 30 pg (ref 26.0–34.0)
MCHC: 33.3 g/dL (ref 30.0–36.0)
MCV: 90.3 fL (ref 78.0–100.0)
Platelets: 217 10*3/uL (ref 150–400)
RBC: 4.93 MIL/uL (ref 3.87–5.11)
RDW: 14.5 % (ref 11.5–15.5)
WBC: 10.6 10*3/uL — ABNORMAL HIGH (ref 4.0–10.5)

## 2012-07-26 LAB — GLUCOSE, CAPILLARY
Glucose-Capillary: 198 mg/dL — ABNORMAL HIGH (ref 70–99)
Glucose-Capillary: 264 mg/dL — ABNORMAL HIGH (ref 70–99)
Glucose-Capillary: 235 mg/dL — ABNORMAL HIGH (ref 70–99)

## 2012-07-26 MED ORDER — AMLODIPINE BESYLATE 10 MG PO TABS
10.0000 mg | ORAL_TABLET | Freq: Every day | ORAL | Status: DC
  Administered 2012-07-27: 10 mg via ORAL
  Filled 2012-07-26: qty 1

## 2012-07-26 MED ORDER — OXYCODONE-ACETAMINOPHEN 5-325 MG PO TABS
1.0000 | ORAL_TABLET | Freq: Four times a day (QID) | ORAL | Status: DC | PRN
  Administered 2012-07-26: 2 via ORAL
  Filled 2012-07-26: qty 2

## 2012-07-26 MED ORDER — SODIUM CHLORIDE 0.9 % IV SOLN: 250.0000 mL | INTRAVENOUS | Status: DC | PRN

## 2012-07-26 MED ORDER — INSULIN GLARGINE 100 UNIT/ML ~~LOC~~ SOLN
35.0000 [IU] | Freq: Two times a day (BID) | SUBCUTANEOUS | Status: DC
  Administered 2012-07-26 – 2012-07-27 (×2): 35 [IU] via SUBCUTANEOUS

## 2012-07-26 MED ORDER — HEPARIN SODIUM (PORCINE) 5000 UNIT/ML IJ SOLN
5000.0000 [IU] | Freq: Three times a day (TID) | INTRAMUSCULAR | Status: DC
  Administered 2012-07-26 – 2012-07-27 (×4): 5000 [IU] via SUBCUTANEOUS
  Filled 2012-07-26 (×7): qty 1

## 2012-07-26 MED ORDER — SENNOSIDES-DOCUSATE SODIUM 8.6-50 MG PO TABS
1.0000 | ORAL_TABLET | Freq: Every evening | ORAL | Status: DC | PRN
Start: 2012-07-26 — End: 2012-07-27

## 2012-07-26 MED ORDER — LORAZEPAM 1 MG PO TABS
1.0000 mg | ORAL_TABLET | Freq: Once | ORAL | Status: AC
  Administered 2012-07-27: 1 mg via ORAL
  Filled 2012-07-26: qty 1

## 2012-07-26 MED ORDER — SODIUM CHLORIDE 0.9 % IJ SOLN: 3.0000 mL | Freq: Two times a day (BID) | INTRAMUSCULAR | Status: DC

## 2012-07-26 MED ORDER — INSULIN ASPART 100 UNIT/ML ~~LOC~~ SOLN
0.0000 [IU] | Freq: Three times a day (TID) | SUBCUTANEOUS | Status: DC
  Administered 2012-07-26 – 2012-07-27 (×3): 5 [IU] via SUBCUTANEOUS
  Administered 2012-07-27: 8 [IU] via SUBCUTANEOUS

## 2012-07-26 MED ORDER — SODIUM CHLORIDE 0.9 % IJ SOLN: 3.0000 mL | INTRAMUSCULAR | Status: DC | PRN

## 2012-07-26 MED ORDER — ASPIRIN 325 MG PO TABS
325.0000 mg | ORAL_TABLET | Freq: Every day | ORAL | Status: DC
  Administered 2012-07-26 – 2012-07-27 (×2): 325 mg via ORAL
  Filled 2012-07-26 (×2): qty 1

## 2012-07-26 MED ORDER — IRBESARTAN 75 MG PO TABS
75.0000 mg | ORAL_TABLET | Freq: Every day | ORAL | Status: DC
  Administered 2012-07-27: 75 mg via ORAL
  Filled 2012-07-26: qty 1

## 2012-07-26 NOTE — Assessment & Plan Note (Signed)
Symptoms concerning for possible CVA especially given history and comorbidities. Will plan to admit to telemetry floor for further work-up of possible stroke. WIll obtain MRI/MRA or brain, carotid dopplers and 2-d echo. Currently on ASA therapy, will await results of MRI and consult neuro as well given this may be her 2nd stroke. If she has had a 2nd stroke, she would likely need anti platelet therapy with plavix. She has had previous CSF leak s/p repair but this does not appear to be meningitis as she has had no fever and no meningeal signs.

## 2012-07-26 NOTE — Evaluation (Signed)
Speech Language Pathology Evaluation Patient Details Name: [name] MRN: [id number] DOB: 09-06-1956 Today's Date: 07/26/2012 Time: [id number] SLP Time Calculation (min): 11 min  Problem List:  Patient Active Problem List  Diagnosis  . Hypertension  . Obesity  . HLD (hyperlipidemia)  . T2DM (type 2 diabetes mellitus)  . Headache  . CSF leak from nose  . Low back pain radiating to both legs  . Blurred vision  . Uric acid kidney stone  . Muscle spasm   Past Medical History:  Past Medical History  Diagnosis Date  . CVA (cerebral infarction) 03/29/2004  . HTN (hypertension)     takes Amlodipine daily  . Hyperlipidemia     takes Pravastatin daily  . Shortness of breath     with exertion  . Headache     related to CSF leakage  . Dizziness     related to CSF leakage  . Stroke 03/2004  . Arthritis     knuckles  . Hx of gout   . Diarrhea   . Urinary frequency   . Urinary urgency   . Diabetes mellitus   . Anxiety     doesn't take any meds for this  . Kidney stones    Past Surgical History:  Past Surgical History  Procedure Date  . Nm myoview ltd 08/25/2011    Low risk study, no evidence of ischemia, EF 44%  . Total abdominal hysterectomy 1994  . Sphenoidectomy 01/29/2012    Procedure: SPHENOIDECTOMY;  Surgeon: Ruby Cola, MD;  Location: [place];  Service: ENT;  Laterality: Left;  REPAIR OF LEFT SPHENOID    . Nasal sinus surgery 01/29/2012    Procedure: ENDOSCOPIC SINUS SURGERY WITH STEALTH;  Surgeon: Ruby Cola, MD;  Location[facility];  Service: ENT;  Laterality: N/A;   HPI:  56 yr old admitted with 2 weeks of headaches, drooling, weakness.  MRI not yet completed.  PMH:  CVA 2005   Assessment / Plan / Recommendation Clinical Impression  Pt. exhibited min-mild dysarthria at conversational level characterized by distortions primarily with fricatives phonemes.  Pt.'s language and cognition are WFL's.  Recommend speech therapy for minimum session x 1 for  compensatory strategies for speech intelligibility for more challenging settings.      SLP Assessment  Patient needs continued Speech Lanaguage Pathology Services    Follow Up Recommendations  None    Frequency and Duration min 1 x/week  1 week       SLP Goals  SLP Goals Potential to Achieve Goals: Good SLP Goal #1: Pt. will demonstrate speech strategies in conversation to improve intelligibility with mod independence.  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, applying for disability)   Cognition  Overall Cognitive Status: Appears within functional limits for tasks assessed Orientation Level: Oriented X4    Comprehension  Auditory Comprehension Overall Auditory Comprehension: Appears within functional limits for tasks assessed Visual Recognition/Discrimination Discrimination: Not tested    Expression Expression Primary Mode of Expression: Verbal Verbal Expression Overall Verbal Expression: Appears within functional limits for tasks assessed Written Expression Dominant Hand: Right Written Expression: Within Functional Limits   Oral / Motor Oral Motor/Sensory Function Overall Oral Motor/Sensory Function: Impaired Labial ROM: Reduced left Labial Symmetry: Within Functional Limits Labial Strength: Within Functional Limits Labial Sensation: Reduced (on left) Lingual ROM: Within Functional Limits Lingual Symmetry: Within Functional Limits Lingual Strength: Reduced Lingual Sensation: Reduced Facial ROM: Reduced left Facial  Symmetry: Within Functional Limits Facial Strength: Reduced Mandible: Within Functional Limits Motor Speech Overall Motor Speech: Impaired Respiration: Within functional limits Phonation: Normal Resonance: Within functional limits Articulation: Within functional limitis Intelligibility: Intelligibility reduced Word: 75-100%  accurate Phrase: 75-100% accurate Sentence: 75-100% accurate Conversation: 75-100% accurate Motor Planning: Witnin functional limits   [employer] M.Ed [employer] [phone]  07/26/2012

## 2012-07-26 NOTE — Consult Note (Signed)
Referring Physician: Hensel    Chief Complaint: Left sided weakness, drooling, poor balance, headache  HPI: [name] is an 56 y.o. female who reports that she has had a constant headache for about 3 weeks.  The headache is holocranial but worse frontally.  It is throbbing but not associated with any nausea, vomiting or photophobia.  It was persistent prior to coming into the hospital but reports that she has no headache at this time.  Patient was concerned about this because when she had her stroke in the past it was preceded by a headache that lasted 2 weeks.  She has a residual left sided numbness and weakness from this stroke but feels that this has worsened over the past 3 weeks since she has had this headache.  It should be noted though that she just feels weak all over.  She also feels that her balance is worse.  She reports her words have been slurred and she has been drooling from the left side of her mouth.    LSN: 3 weeks ago tPA Given: No: Outside time window  Past Medical History  Diagnosis Date  . HTN (hypertension)     takes Amlodipine daily  . Hyperlipidemia     takes Pravastatin daily  . Dizziness     related to CSF leakage  . Hx of gout   . Diarrhea   . Urinary frequency   . Urinary urgency   . Anxiety     doesn't take any meds for this  . Kidney stones   . Complication of anesthesia 01/2012    reports she extubated herself  . Asthma   . COPD (chronic obstructive pulmonary disease)   . History of chronic bronchitis 07/26/2012    "just about q winter"  . Shortness of breath     "lying down; w/exertion; during heat"  . OSA (obstructive sleep apnea) 07/26/2012    denies CPAP  . Type II diabetes mellitus   . Headache     related to CSF leakage  . Stroke 03/2004    denies residual 07/26/2012  . Arthritis     knuckles  . Claustrophobia 07/26/2012    Past Surgical History  Procedure Date  . Nm myoview ltd 08/25/2011    Low risk study, no evidence of  ischemia, EF 44%  . Sphenoidectomy 01/29/2012    Procedure: SPHENOIDECTOMY;  Surgeon: Ruby Cola, MD;  Location: [facility];  Service: ENT;  Laterality: Left;  REPAIR OF LEFT SPHENOID    . Nasal sinus surgery 01/29/2012    Procedure: ENDOSCOPIC SINUS SURGERY WITH STEALTH;  Surgeon: Ruby Cola, MD;  Location: [place];  Service: ENT;  Laterality: N/A;  . Total abdominal hysterectomy 1994  . Refractive surgery ~ 2010    left    Family History  Problem Relation Age of Onset  . Colon cancer Sister 36  . Breast cancer Mother 39  . Diabetes Sister 34  . Diabetes Sister 62  . Lung cancer Father   . Anesthesia problems Neg Hx   . Hypotension Neg Hx   . Malignant hyperthermia Neg Hx   . Pseudochol deficiency Neg Hx    Social History:  reports that she has been smoking Cigarettes.  She has a 8.75 pack-year smoking history. She does not have any smokeless tobacco history on file. She reports that she does not drink alcohol or use illicit drugs.  Allergies:  Allergies  Allergen Reactions  . Atorvastatin Other (See Comments)    "  legs cramped; moderate to almost severe"  . Metformin And Related Nausea And Vomiting  . Vicodin (Hydrocodone-Acetaminophen) Nausea And Vomiting  . Ace Inhibitors Cough    Medications:  I have reviewed the patient's current medications. Prior to Admission:  Prescriptions prior to admission  Medication Sig Dispense Refill  . amLODipine (NORVASC) 10 MG tablet Take 10 mg by mouth daily.      Marland Kitchen aspirin 325 MG tablet Take 325 mg by mouth daily.      . insulin detemir (LEVEMIR) 100 UNIT/ML injection Inject 35 Units into the skin 2 (two) times daily.      . valsartan (DIOVAN) 80 MG tablet Take 40 mg by mouth daily.       Scheduled:   . amLODipine  10 mg Oral Daily  . aspirin  325 mg Oral Daily  . heparin  5,000 Units Subcutaneous Q8H  . insulin aspart  0-15 Units Subcutaneous TID WC  . insulin glargine  35 Units Subcutaneous BID  . irbesartan  75 mg Oral Daily  .  LORazepam  1 mg Oral Once  . sodium chloride  3 mL Intravenous Q12H    ROS: History obtained from the patient  General ROS: 60 pound weight gain over the past year Psychological ROS: negative for - behavioral disorder, hallucinations, memory difficulties, mood swings or suicidal ideation Ophthalmic ROS: poor vision from the left eye ENT ROS: negative for - epistaxis, nasal discharge, oral lesions, sore throat, tinnitus or vertigo Allergy and Immunology ROS: negative for - hives or itchy/watery eyes Hematological and Lymphatic ROS: negative for - bleeding problems, bruising or swollen lymph nodes Endocrine ROS: negative for - galactorrhea, hair pattern changes, polydipsia/polyuria or temperature intolerance Respiratory ROS: negative for - cough, hemoptysis, shortness of breath or wheezing Cardiovascular ROS: negative for - chest pain, dyspnea on exertion, edema or irregular heartbeat Gastrointestinal ROS: negative for - abdominal pain, diarrhea, hematemesis, nausea/vomiting or stool incontinence Genito-Urinary ROS: negative for - dysuria, hematuria, incontinence or urinary frequency/urgency Musculoskeletal ROS: negative for - joint swelling or muscular weakness Neurological ROS: as noted in HPI Dermatological ROS: negative for rash and skin lesion changes  Physical Examination: Blood pressure 164/82, pulse 69, temperature 97.7 F (36.5 C), temperature source Axillary, resp. rate 18, SpO2 93.00%.  Neurologic Examination: Mental Status: Alert, oriented, thought content appropriate.  Speech fluent without evidence of aphasia.  Able to follow 3 step commands without difficulty. Cranial Nerves: II: Discs flat bilaterally; Vvisual fields grossly normal, pupils equal, round, reactive to light and accommodation III,IV, VI: ptosis not present, extra-ocular motions intact bilaterally V,VII: smile symmetric, facial light touch sensation decreased on the left side of the face VIII: hearing normal  bilaterally IX,X: gag reflex present XI: bilateral shoulder shrug XII: tongue strength normal  Motor: Right : Upper extremity   5/5    Left:     Upper extremity   5/5, no drift, 4+/5 hand grip  Lower extremity   5/5     Lower extremity   5/5 Tone and bulk:normal tone throughout; no atrophy noted Sensory: Pinprick and light touch decreased in the left upper and lower extremity Deep Tendon Reflexes: 1+ and symmetric throughout Plantars: Right: equivocal   Left: equivocal Cerebellar: normal finger-to-nose, normal rapid alternating movements and normal heel-to-shin test.  Patient able to rise from the bed without assistance and ambulate to the bathroom unassisted and without any loss of balance.     Laboratory Studies:  Basic Metabolic Panel:  Lab [id number]  NA 138  K 3.9  CL 97  CO2 29  GLUCOSE 256*  BUN 19  CREATININE 1.21*  CALCIUM 9.3  MG --  PHOS --    Liver Function Tests:  Lab 07/26/12 1512  AST 24  ALT 16  ALKPHOS 110  BILITOT 0.4  PROT 7.5  ALBUMIN 3.7   No results found for this basename: LIPASE:5,AMYLASE:5 in the last 168 hours No results found for this basename: AMMONIA:3 in the last 168 hours  CBC:  Lab 07/26/12 1512  WBC 10.6*  NEUTROABS --  HGB 14.8  HCT 44.5  MCV 90.3  PLT 217    Cardiac Enzymes: No results found for this basename: CKTOTAL:5,CKMB:5,CKMBINDEX:5,TROPONINI:5 in the last 168 hours  BNP: No components found with this basename: POCBNP:5  CBG:  Lab 07/26/12 1743 07/26/12 1628 07/26/12 1242  GLUCAP 235* 264* 198*    Microbiology: Results for orders placed in visit on 03/18/12  STONE ANALYSIS     Status: Normal   Collection Time   03/18/12  8:56 AM      Component Value Range Status Comment   Component 1 KSTONE See Below   Final    Stone Weight KSTONE 0.0220   Final [id number] = "Not observed"    Coagulation Studies: No results found for this basename: LABPROT:5,INR:5 in the last 72 hours  Urinalysis: No results  found for this basename: COLORURINE:2,APPERANCEUR:2,LABSPEC:2,PHURINE:2,GLUCOSEU:2,HGBUR:2,BILIRUBINUR:2,KETONESUR:2,PROTEINUR:2,UROBILINOGEN:2,NITRITE:2,LEUKOCYTESUR:2 in the last 168 hours  Lipid Panel:    Component Value Date/Time   CHOL 256* 08/08/2011 0510   TRIG 350* 08/08/2011 0510   HDL 34* 08/08/2011 0510   CHOLHDL 7.5 08/08/2011 0510   VLDL 70* 08/08/2011 0510   LDLCALC 152* 08/08/2011 0510    HgbA1C:  Lab Results  Component Value Date   HGBA1C 8.3 05/26/2012    Urine Drug Screen:     Component Value Date/Time   LABOPIA POSITIVE* 07/05/2010 0214   COCAINSCRNUR POSITIVE* 07/05/2010 0214   LABBENZ NONE DETECTED 07/05/2010 0214   AMPHETMU NONE DETECTED 07/05/2010 0214   THCU RESULTS UNAVAILABLE DUE TO INTERFERING SUBSTANCE RECOMMEND RECOLLECT* 07/05/2010 0214   LABBARB  Value: NONE DETECTED        DRUG SCREEN FOR MEDICAL PURPOSES ONLY.  IF CONFIRMATION IS NEEDED FOR ANY PURPOSE, NOTIFY LAB WITHIN 5 DAYS.        LOWEST DETECTABLE LIMITS FOR URINE DRUG SCREEN Drug Class       Cutoff (ng/mL) Amphetamine      1000 Barbiturate      200 Benzodiazepine   [id number] Tricyclics       [id number] Opiates          300 Cocaine          300 THC              50 07/05/2010 0214    Alcohol Level: No results found for this basename: ETH:2 in the last 168 hours  Imaging: No results found.  Assessment: 56 y.o. female who reports multiple complaints that are not particularly localizing, especially when combined with her neurological examination which is not particularly remarkable.  Patient admits to just feeling poorly all over and has been having symptoms for at least 3-4 weeks.  Due to her history and multiple risk factors will rule out stroke.  Patient has had some difficulty getting her medications as an outpatient and can not rule out that she has just been feeling poorly secondary to the difficulty in managing her multiple medical problems.    Stroke Risk Factors - diabetes mellitus,  hyperlipidemia, hypertension  and stroke in the past  Plan: 1. HgbA1c, fasting lipid panel 2. MRI, MRA  of the brain without contrast 3. PT consult, OT consult, Speech consult 4. Echocardiogram results pending 5. Carotid doppler preliminary results show a 60-79% bilateral ICA stenosis.  Will await final and MRA results.  Will also need results of MRI to determine if this stenosis is symptomatic or asymptomatic.   6. Prophylactic therapy-continue ASA.  Will determine need for a change in prophylactic therapy based on MRI results.   7. Risk factor modification 8. Telemetry monitoring 9. Frequent neuro checks   Alexis Goodell, MD [facility] [phone] 07/26/2012, 10:35 PM

## 2012-07-26 NOTE — Progress Notes (Signed)
FPTS ACCEPT NOTE  [name] appears well since getting to the floor.  Does have some mild headache, which she would like some pain medication for.  Also is concerned about her claustrophobia and is wondering if she can have something for anxiety prior to getting her MRI done.  Feels like her difficulty with speaking is resolving as is some of her left sided weakness.  Still with some numbness in her left hand, which is different than at her baseline.  -Morphine PRN headache/pain -Ativan prior to MRI-- will give po since no IV has been successful yet.  [name] 07/26/2012, 3:48 PM

## 2012-07-26 NOTE — Progress Notes (Signed)
Patient ID: [name], female   DOB: 1956-07-03, 56 y.o.   MRN: [id number]  S: 56 yo female with history of DM, HTN, HLD and prior stroke who presents to clinic with complaint of headache of 2 weeks duration and recent increase in L sided weakness and difficulty with balance. She still has mild residual weakness from previous stroke, however she feels like the weakness has increased from her baseline and her L arm is numb feeling. When walking she states that she feels like she is leaning to the left side. Also feels like she is drooling and has slurred speech. She does endorse vision problems, especially with the L eye but this is not new she says. Headache described as "all over" and throbbing. She has been compliant with her antihypertensive medication, however she has not been able to pick up her pravastatin. Her diabetes has been poorly controlled. She denies chest pain, shortness of breath, difficulty with grip strength. She did take her 81mg  asa this morning.  ROS: Constitutional: Negative for fever and chills.  Eyes: Positive for blurred vision. Negative for double vision and photophobia.  Respiratory: Negative for cough.  Cardiovascular: Negative for chest pain, palpitations and leg swelling.  Gastrointestinal: Negative for nausea and vomiting.  Genitourinary:  Feels like she has had decreased urine output-this got better when she drank a glass of sweet tea  Musculoskeletal: Negative for myalgias.  Neurological: Positive for tingling, speech change, focal weakness and headaches.     O: BP 182/95  Pulse 75  Ht 5\' 2"  (1.575 m)  Wt 252 lb (114.306 kg)  BMI 46.09 kg/m2 Constitutional:  Morbidly obese female, nad HENT:  Head: Normocephalic and atraumatic.  Eyes: Conjunctivae and EOM are normal. Pupils are equal, round, and reactive to light.  Neck: Neck supple. No thyromegaly present.  Cardiovascular: Normal rate and regular rhythm.  Respiratory: Effort normal and breath  sounds normal. No respiratory distress.  GI:  Obese, non-tender  Musculoskeletal: She exhibits no edema.  Neurological: She is alert.  Strength in UE 5/5 bilaterally. Strength in RLE 5/5, LLE 4/5. Cranial nerves intact. Speech slightly slurred. She walks with a normal gait but has to hold on to the wall. Finger to nose, heel to shin and RAM negative. Rhomberg +, with leaning to the left

## 2012-07-26 NOTE — H&P (Signed)
[name] is an 56 y.o. female.   Chief Complaint: Weakness/Imbalance/Headache  HPI: 56 yo female with history of DM, HTN, HLD and prior stroke who presents to clinic with complaint of headache of 2 weeks duration and recent increase in L sided weakness and difficulty with balance.  She still has mild residual weakness from previous stroke, however she feels like the weakness has increased from her baseline and her L arm is numb feeling.  When walking she states that she feels like she is leaning to the left side.  Also feels like she is drooling and has slurred speech.  She does endorse vision problems, especially with the L eye but this is not new she says.  Headache described as "all over" and throbbing.    She has been compliant with her antihypertensive medication, however she has not been able to pick up her pravastatin.  Her diabetes has been poorly controlled.  She denies chest pain, shortness of breath, difficulty with grip strength.  She did take her 81mg  asa this morning.   Past Medical History  Diagnosis Date  . CVA (cerebral infarction) 03/29/2004  . HTN (hypertension)     takes Amlodipine daily  . Hyperlipidemia     takes Pravastatin daily  . Shortness of breath     with exertion  . Headache     related to CSF leakage  . Dizziness     related to CSF leakage  . Stroke 03/2004  . Arthritis     knuckles  . Hx of gout   . Diarrhea   . Urinary frequency   . Urinary urgency   . Diabetes mellitus   . Anxiety     doesn't take any meds for this  . Kidney stones     Past Surgical History  Procedure Date  . Nm myoview ltd 08/25/2011    Low risk study, no evidence of ischemia, EF 44%  . Total abdominal hysterectomy 1994  . Sphenoidectomy 01/29/2012    Procedure: SPHENOIDECTOMY;  Surgeon: Ruby Cola, MD;  Location: [place];  Service: ENT;  Laterality: Left;  REPAIR OF LEFT SPHENOID    . Nasal sinus surgery 01/29/2012    Procedure: ENDOSCOPIC SINUS SURGERY WITH STEALTH;   Surgeon: Ruby Cola, MD;  Location: [facility];  Service: ENT;  Laterality: N/A;    Family History  Problem Relation Age of Onset  . Colon cancer Sister 4  . Breast cancer Mother 76  . Diabetes Sister 36  . Diabetes Sister 1  . Lung cancer Father   . Anesthesia problems Neg Hx   . Hypotension Neg Hx   . Malignant hyperthermia Neg Hx   . Pseudochol deficiency Neg Hx    Social History:  reports that she has been smoking.  She does not have any smokeless tobacco history on file. She reports that she does not drink alcohol or use illicit drugs.  Allergies:  Allergies  Allergen Reactions  . Ace Inhibitors Cough  . Atorvastatin     Did not feel good taking this medication  . Metformin And Related Nausea And Vomiting  . Vicodin (Hydrocodone-Acetaminophen) Nausea And Vomiting    Medications Prior to Admission  Medication Sig Dispense Refill  . amLODipine (NORVASC) 10 MG tablet Take 10 mg by mouth daily.      Marland Kitchen aspirin 325 MG tablet Take 325 mg by mouth daily.      . insulin detemir (LEVEMIR) 100 UNIT/ML injection Inject 35 Units into the skin 2 (  two) times daily.      . valsartan (DIOVAN) 80 MG tablet Take 40 mg by mouth daily.        Results for orders placed during the hospital encounter of 07/26/12 (from the past 48 hour(s))  GLUCOSE, CAPILLARY     Status: Abnormal   Collection Time   07/26/12 12:42 PM      Component Value Range Comment   Glucose-Capillary 198 (*) 70 - 99 mg/dL    No results found.  Review of Systems  Constitutional: Negative for fever and chills.  Eyes: Positive for blurred vision. Negative for double vision and photophobia.  Respiratory: Negative for cough.   Cardiovascular: Negative for chest pain, palpitations and leg swelling.  Gastrointestinal: Negative for nausea and vomiting.  Genitourinary:       Feels like she has had decreased urine output-this got better when she drank a glass of sweet tea   Musculoskeletal: Negative for myalgias.    Neurological: Positive for tingling, speech change, focal weakness and headaches.    Blood pressure 153/78, pulse 68, temperature 97.8 F (36.6 C), temperature source Oral, resp. rate 16, SpO2 98.00%. Physical Exam  Constitutional:       Morbidly obese female, nad   HENT:  Head: Normocephalic and atraumatic.  Eyes: Conjunctivae and EOM are normal. Pupils are equal, round, and reactive to light.  Neck: Neck supple. No thyromegaly present.  Cardiovascular: Normal rate and regular rhythm.   Respiratory: Effort normal and breath sounds normal. No respiratory distress.  GI:       Obese, non-tender   Musculoskeletal: She exhibits no edema.  Neurological: She is alert.       Strength in UE 5/5 bilaterally.  Strength in RLE 5/5, LLE 4/5.  Cranial nerves intact.  Speech slightly slurred.  She walks with a normal gait but has to hold on to the wall.  Finger to nose, heel to shin and RAM negative.  Rhomberg +, with leaning to the left     Assessment/Plan 56 year old female with history of HTN, DM, HLD, and previous stroke here with symptoms of possible CVA vs. TIA  1. Headache/Weakness:  Symptoms concerning for possible CVA especially given history and comorbidities.  Will plan to admit to telemetry floor for further work-up of possible stroke.  WIll obtain MRI/MRA or brain, carotid dopplers and 2-d echo.  Currently on ASA therapy, will await results of MRI and consult neuro as well given this may be her 2nd stroke.   If she has had a 2nd stroke, she would likely need anti platelet therapy with plavix.  She has had previous CSF leak s/p repair but this does not appear to be meningitis as she has had no fever and no meningeal signs.    2. DM:  Pretty poorly controlled so far, especially with having to switch insulin therapies.  She is currently using 35 units of levemir bid.  Will substitue lantus for levemir in hospital but keep at 35 units for now and add moderate SSI.  Check A1C for risk  stratification  3.  HTN:  Will allow for permissive hypertension at this time given exam findings.  Keep systolic BP [id number]. Will slowly add back on antihypertensives in 24-48 hours.  May need more aggressive antihypertensive therapy outpatient.  4.  HLD:  Prescribed pravastatin, however has had difficulty obtaining this through the [facility] program so is currently not taking this.  Could not tolerate lipitor in the past.  Unable to afford simvastatin.  Will check lipid panel for risk stratification, restart statin in hospital  5.  Decreased urination:  Subjective feeling of decreased urination.   Does have mild degree of renal insufficiency and history of UA stones. Will check renal function in hospital and monitor UOP.  6.  Tobacco abuse:  Will order smoking cessation counseling.   7.  FEN/GI:  SLIV, CHO mod medium diet once passed swallow eval  8.  PPX: Heparin  9. Dispo:  Pending further workup  MATTHEWS,CODY 07/26/2012, 2:04 PM   Seen and examined.  Chart reviewed.  Discussed with Dr Zigmund Daniel.  Agree with his management and documentation.  Briefly, 56 yo female with HTN, DM, HLD and previous CVA presents with symptoms consistent with TIA or new stroke.  She has been without health insurance and unable to afford medications resulting in a deterioration of control of her chronic medical problems.  She also still smokes, but has decreased from 2ppd to <1/2 ppd.   Agree with admit for expedited TIA work up.

## 2012-07-26 NOTE — Progress Notes (Signed)
  Echocardiogram 2D Echocardiogram has been performed.  Diamond Nickel 07/26/2012, 5:16 PM

## 2012-07-26 NOTE — Progress Notes (Signed)
*  PRELIMINARY RESULTS* Vascular Ultrasound Carotid Duplex (Doppler) has been completed.  Preliminary findings: Bilaterally 60-79% ICA stenosis with antegrade vertebral flow.  Landry Mellow, RDMS, RVT  07/26/2012, 5:25 PM

## 2012-07-27 ENCOUNTER — Inpatient Hospital Stay (HOSPITAL_COMMUNITY): Payer: MEDICAID

## 2012-07-27 DIAGNOSIS — I6350 Cerebral infarction due to unspecified occlusion or stenosis of unspecified cerebral artery: Principal | ICD-10-CM

## 2012-07-27 DIAGNOSIS — E119 Type 2 diabetes mellitus without complications: Secondary | ICD-10-CM

## 2012-07-27 LAB — LIPID PANEL
Cholesterol: 234 mg/dL — ABNORMAL HIGH (ref 0–200)
HDL: 33 mg/dL — ABNORMAL LOW
LDL Cholesterol: 138 mg/dL — ABNORMAL HIGH (ref 0–99)
Total CHOL/HDL Ratio: 7.1 ratio
Triglycerides: 315 mg/dL — ABNORMAL HIGH
VLDL: 63 mg/dL — ABNORMAL HIGH (ref 0–40)

## 2012-07-27 LAB — GLUCOSE, CAPILLARY
Glucose-Capillary: 216 mg/dL — ABNORMAL HIGH (ref 70–99)
Glucose-Capillary: 245 mg/dL — ABNORMAL HIGH (ref 70–99)
Glucose-Capillary: 256 mg/dL — ABNORMAL HIGH (ref 70–99)
Glucose-Capillary: 292 mg/dL — ABNORMAL HIGH (ref 70–99)

## 2012-07-27 LAB — HEMOGLOBIN A1C
Hgb A1c MFr Bld: 9.7 % — ABNORMAL HIGH
Mean Plasma Glucose: 232 mg/dL — ABNORMAL HIGH

## 2012-07-27 MED ORDER — LORAZEPAM 2 MG/ML IJ SOLN: 1.0000 mg | Freq: Once | INTRAMUSCULAR | Status: AC

## 2012-07-27 MED ORDER — LORAZEPAM 1 MG PO TABS
1.0000 mg | ORAL_TABLET | Freq: Once | ORAL | Status: AC
  Administered 2012-07-27: 1 mg via ORAL
  Filled 2012-07-27: qty 1

## 2012-07-27 MED ORDER — PRAVASTATIN SODIUM 80 MG PO TABS: 80.0000 mg | ORAL_TABLET | Freq: Every day | ORAL | Status: DC

## 2012-07-27 MED ORDER — SIMVASTATIN 20 MG PO TABS
20.0000 mg | ORAL_TABLET | Freq: Every day | ORAL | Status: DC
  Administered 2012-07-27: 20 mg via ORAL
  Filled 2012-07-27: qty 1

## 2012-07-27 NOTE — Progress Notes (Signed)
Inpatient Diabetes Program Recommendations  AACE/ADA: New Consensus Statement on Inpatient Glycemic Control (2013)  Target Ranges:  Prepandial:   less than 140 mg/dL      Peak postprandial:   less than 180 mg/dL (1-2 hours)      Critically ill patients:  140 - 180 mg/dL   Reason for Visit: Results for [name], [name] (MRN [id number]) as of 07/27/2012 14:27  Ref. Range 07/26/2012 16:28 07/26/2012 17:43 07/27/2012 00:40 07/27/2012 06:39 07/27/2012 11:27  Glucose-Capillary Latest Range: 70-99 mg/dL 264 (H) 235 (H) 292 (H) 245 (H) 256 (H)    CBG's greater than goal.  Note A1C=9.7%.  Patient likely need prandial/meal coverage insulin added.  Consider adding Novolog 6 units tid with meals to cover carbohydrate intake.  May consider continuing Prandial insulin once patient is discharged home also to help improve A1C.  Note patient is followed by [facility].   Note: Will follow.

## 2012-07-27 NOTE — Evaluation (Signed)
Physical Therapy Evaluation Patient Details Name: [name] MRN: [id number] DOB: January 21, 1956 Today's Date: 07/27/2012 Time: [id number] PT Time Calculation (min): 28 min  PT Assessment / Plan / Recommendation Clinical Impression  Pt admitted with HA, difficulty with speech and increased LUE numbness awaiting MRI. Pt very pleasant and demonstrates balance deficits that will benefit from further therapy to address. Pt educated for CVA risk factors as well as signs/symptoms. Pt encouraged to continue mobility with nursing due to fall risk and recommend single point cane which pt states she can purchase on her own. Will follow acutely to maximize balance, independence and safety prior to return home.     PT Assessment  Patient needs continued PT services    Follow Up Recommendations  Outpatient PT    Barriers to Discharge None      Equipment Recommendations  Cane    Recommendations for Other Services     Frequency Min 3X/week    Precautions / Restrictions Precautions Precautions: Fall   Pertinent Vitals/Pain 5/10 HA      Mobility  Bed Mobility Bed Mobility: Supine to Sit Supine to Sit: 6: Modified independent (Device/Increase time);HOB elevated (HOB 30degrees) Transfers Transfers: Sit to Stand;Stand to Sit;Stand Pivot Transfers Sit to Stand: 6: Modified independent (Device/Increase time);From bed;From chair/3-in-1 Stand to Sit: 6: Modified independent (Device/Increase time);To chair/3-in-1;To bed Stand Pivot Transfers: 6: Modified independent (Device/Increase time) Ambulation/Gait Ambulation/Gait Assistance: 6: Modified independent (Device/Increase time) Ambulation Distance (Feet): 600 Feet Assistive device: None Ambulation/Gait Assistance Details: Pt with decreased stride and increased sway with LOB requiring pt stop ambulating with looking up, alteration of gait with horizontal head turns Gait Pattern: Step-through pattern;Decreased stride length Stairs: No      Exercises     PT Diagnosis: Abnormality of gait  PT Problem List: Decreased activity tolerance;Decreased balance PT Treatment Interventions: Gait training;Stair training;Functional mobility training;Balance training;Therapeutic activities;Patient/family education;Neuromuscular re-education;DME instruction   PT Goals Acute Rehab PT Goals PT Goal Formulation: With patient Time For Goal Achievement: 08/03/12 Potential to Achieve Goals: Good Pt will Ambulate: >150 feet;Independently PT Goal: Ambulate - Progress: Goal set today Pt will Go Up / Down Stairs: 1-2 stairs;with rail(s);with modified independence PT Goal: Up/Down Stairs - Progress: Goal set today Additional Goals Additional Goal #1: Pt will score >46 on Berg to decrease fall risk PT Goal: Additional Goal #1 - Progress: Goal set today  Visit Information  Last PT Received On: 07/27/12 Assistance Needed: +1    Subjective Data  Subjective: I just feel like I've been going downhill since my brain sx in Feb Patient Stated Goal: be able to walk better   Prior Functioning  Home Living Lives With: Family (mom and sister) Available Help at Discharge: Family;Available PRN/intermittently Type of Home: House Home Access: Stairs to enter [employer] of Steps: 2 Entrance Stairs-Rails: Left Home Layout: One level Bathroom Shower/Tub: Chiropodist: Handicapped height Home Adaptive Equipment: None Prior Function Level of Independence: Independent Able to Take Stairs?: Yes Driving: Yes Vocation: Unemployed Comments: pt laid off from job of 11yrs a year ago Communication Communication: No difficulties Dominant Hand: Right    Cognition  Overall Cognitive Status: Appears within functional limits for tasks assessed/performed Arousal/Alertness: Awake/alert Orientation Level: Appears intact for tasks assessed Behavior During Session: [facility] for tasks performed    Extremity/Trunk Assessment Right Lower  Extremity Assessment RLE ROM/Strength/Tone: Within functional levels (5/5 hip and knee myotomes) RLE Sensation: WFL - Light Touch RLE Coordination: WFL - gross/fine motor Left Lower Extremity Assessment LLE  ROM/Strength/Tone: [facility] for tasks assessed;Deficits LLE ROM/Strength/Tone Deficits: 4/5: hip flexion, hip ABdct/Add, 4+/5 knee extension LLE Sensation: WFL - Light Touch LLE Coordination: WFL - gross/fine motor   Balance Standardized Balance Assessment Standardized Balance Assessment: Berg Balance Test Berg Balance Test Sit to Stand: Able to stand  independently using hands Standing Unsupported: Able to stand safely 2 minutes Sitting with Back Unsupported but Feet Supported on Floor or Stool: Able to sit safely and securely 2 minutes Stand to Sit: Sits safely with minimal use of hands Transfers: Able to transfer safely, minor use of hands Standing Unsupported with Eyes Closed: Able to stand 10 seconds with supervision Standing Ubsupported with Feet Together: Able to place feet together independently and stand for 1 minute with supervision From Standing, Reach Forward with Outstretched Arm: Can reach forward >12 cm safely (5") From Standing Position, Pick up Object from Floor: Able to pick up shoe safely and easily From Standing Position, Turn to Look Behind Over each Shoulder: Looks behind one side only/other side shows less weight shift Turn 360 Degrees: Able to turn 360 degrees safely but slowly Standing Unsupported, Alternately Place Feet on Step/Stool: Able to complete >2 steps/needs minimal assist Standing Unsupported, One Foot in Front: Able to take small step independently and hold 30 seconds Standing on One Leg: Unable to try or needs assist to prevent fall Total Score: 40  High Level Balance High Level Balance Activites: Head turns High Level Balance Comments: gait deviation with head turns  End of Session PT - End of Session Equipment Utilized During Treatment: Gait  belt Activity Tolerance: Patient tolerated treatment well Patient left: in chair;with call bell/phone within reach  GP     Melford Aase 07/27/2012, 11:28 AM  Elwyn Reach, [place]

## 2012-07-27 NOTE — Progress Notes (Signed)
[facility] PROGRESS NOTE    SUBJECTIVE   No complaints. States her HA has improved with narcotics and her symptoms have not worsened--infact, speech may be improving.   OBJECTIVE   Vital signs in last 24 hours: Temp:  [97.5 F (36.4 C)-98.1 F (36.7 C)] 97.9 F (36.6 C) (08/20 0600) Pulse Rate:  [68-78] 72  (08/20 0600) Resp:  [16-18] 18  (08/20 0600) BP: (138-182)/(73-95) 161/77 mmHg (08/20 0600) SpO2:  [91 %-98 %] 93 % (08/20 0600) Weight:  [114.306 kg (252 lb)-115.5 kg (254 lb 10.1 oz)] 115.5 kg (254 lb 10.1 oz) (08/20 0040)  Intake/Output from previous day:   Intake/Output this shift:   Nutritional status: Carb Control  Past Medical History  Diagnosis Date  . HTN (hypertension)     takes Amlodipine daily  . Hyperlipidemia     takes Pravastatin daily  . Dizziness     related to CSF leakage  . Hx of gout   . Diarrhea   . Urinary frequency   . Urinary urgency   . Anxiety     doesn't take any meds for this  . Kidney stones   . Complication of anesthesia 01/2012    reports she extubated herself  . Asthma   . COPD (chronic obstructive pulmonary disease)   . History of chronic bronchitis 07/26/2012    "just about q winter"  . Shortness of breath     "lying down; w/exertion; during heat"  . OSA (obstructive sleep apnea) 07/26/2012    denies CPAP  . Type II diabetes mellitus   . Headache     related to CSF leakage  . Stroke 03/2004    denies residual 07/26/2012  . Arthritis     knuckles  . Claustrophobia 07/26/2012    Neurologic ROS negative with exception of above   Neurologic Exam:   Mental Status: Alert, oriented X 3.  Speech fluent without evidence of aphasia. No dysarthria noted. Patient was on phone when I entered room and speaking very clearly.  When she stopped her conversation and was asked how her speech was, she stumbled over a word but otherwise was fine.  Able to follow 3 step commands without  difficulty. Mood: happy Memory:intact Thought content appropriate Cranial Nerves: II-Visual fields grossly intact. III/IV/VI-Extraocular movements intact.  Pupils reactive bilaterally. Ptosis not present. V/VII-Smile symmetric VIII-grossly intact IX/X-normal gag XI-bilateral shoulder shrug XII-midline tongue extension Motor: 5/5 bilaterally with normal tone and bulk--with left grip patient shoed inconsistent and give way strength.  When attention diverter, she was full 5/5 with grip and clawing of fingers.  Sensory: Pinprick and light touch intact throughout, bilaterally Deep Tendon Reflexes:  Right: Upper Extremity   Left: Upper extremity   biceps (C-5 to C-6) 1/4   biceps (C-5 to C-6) 1/4 tricep (C7) 1/4    triceps (C7) 1/4 Brachioradialis (C6) 1/4  Brachioradialis (C6) 1/4  Lower Extremity Lower Extremity  quadriceps (L-2 to L-4) 1/4   quadriceps (L-2 to L-4) 1/4 Achilles (S1) 0/4   Achilles (S1) 0/4      Plantars:      Right:  mute  Left:  mute Cerebellar: Normal finger-to-nose, normal rapid alternating movements and normal heel-to-shin test.    Lab Results: Lab Results  Component Value Date/Time  CHOL 234* 07/27/2012  5:00 AM   Lipid Panel  Basename 07/27/12 0500  CHOL 234*  TRIG 315*  HDL 33*  CHOLHDL 7.1  VLDL 63*  LDLCALC 138*    Studies/Results: No results found.  Medications:     Scheduled:   . amLODipine  10 mg Oral Daily  . aspirin  325 mg Oral Daily  . heparin  5,000 Units Subcutaneous Q8H  . insulin aspart  0-15 Units Subcutaneous TID WC  . insulin glargine  35 Units Subcutaneous BID  . irbesartan  75 mg Oral Daily  . LORazepam  1 mg Oral Once  . simvastatin  20 mg Oral q1800  . sodium chloride  3 mL Intravenous Q12H    Assessment/Plan:  56 y.o. female with multiple complaints that are not particularly localizing, especially when combined with her neurological examination which is unremarkable and inconsistant. Due to her history and  multiple risk factors will rule out stroke with MRI.   Patient has had some difficulty getting her medications as an outpatient and can not rule out that she has just been feeling poorly secondary to the difficulty in managing her multiple medical problems.   Stroke Risk Factors - diabetes mellitus, hyperlipidemia, hypertension and stroke in the past LDL-138, Glucose 256, ECHO normal, A1c pending, MRI/MRA head pending,   Plan:  1) continue ASA and recommend increase statin as LDL is 138 2) MRI/MRA head 3) Carotid doppler preliminary results show a 60-79% bilateral ICA stenosis. Will await final and MRA results. Will also need results of MRI to determine if this stenosis is symptomatic or asymptomatic especially given exam inconsistency 4) Risk factor modification  5) Telemetry monitoring  6) Frequent neuro checks 7) PT/OT    Etta Quill PA-C [facility] [phone]  07/27/2012, 10:05 AM

## 2012-07-27 NOTE — Progress Notes (Signed)
Attempted to page both Neuro PA as well as Neuro MD on call multiple times (4 pages starting at 4:10pm) without any call back.  When final page was returned, MD stated he did not have time for non-emergencies and did not have time to advise on dispo planning of the patient.   Was attempting to ask Neurologists if they would like for anything else to be done in hospital for this pt.  MRA showed no significant stenosis, so likely do not need to consider carotid endarterectomy.  Patient would like to be d/c'ed home and is medically stable.  Will d/c pt home with follow up with [facility].  If needed, PCP could arrange an outpatient follow up with Neurology, but based on MRI, this was NOT a new stroke.   [name] 07/27/2012, 6:36 PM

## 2012-07-27 NOTE — Progress Notes (Signed)
Seen and examined.  Discussed with Dr. Caryl Bis and [place] team.  Agree with management.  Briefly, patient feeling better.  WU reinforces the need for optimal med management.  Will need statin, better DM control, antiplatelet therapy and lifestyle modifications.  Patient states she is motivated to make changes.  I am comfortable with DC today.

## 2012-07-27 NOTE — Discharge Summary (Signed)
Physician Discharge Summary  Patient ID: [name] MRN: [id number] DOB: 07-29-1956 Age: 56 y.o.  Admit date: 07/26/2012 Discharge date: 07/27/2012 Admitting Physician: Zigmund Gottron, MD  PCP: Luetta Nutting, DO  Consultants:  Alexis Goodell, MD  [facility]  Discharge Diagnosis: TIA Principal Problem:  *CVA (cerebral infarction) Active Problems:  Hypertension  Obesity  HLD (hyperlipidemia)  T2DM (type 2 diabetes mellitus)    Hospital Course Patient admitted 8/19 to telemetry from clinic following several days of slurred speech and left sided weakness.  Neurology consulted and gave recommendations for HgbA1c, fasting lipid panel, MRI, MRA of the brain without contrast, PT consult, OT consult, Speech consult, Echocardiogram, Carotid doppler.  Patient started on ASA therapy. Started on simvastatin in hospital as not previously on statin therapy and LDL of 138. PT, OT, and speech consulted and recommendations appreciated.  Outpatient PT set up through case management.  Allowed patient permissive HTN in setting of possible CVA.  By 8/20 patient symptoms improved with particular subjective improvement in speech and left sided weakness.  MRI and MRA negative with regards to stroke or other cause of patient symptoms Patients DM was managed with lantus and SSI.  Patient also complained of decreased urination and BMP was checked with patient's Cr at baseline.  At time of discharge patient was stable and asking to go home.  Problem List 1. TIA 2. DM 3. HTN 4. HLD 5. Decreased urination 6. Tobacco abuse   Discharge PE General: NAD, resting comfortably in bed  Respiratory: Normal work of breathing  Extremities: no swelling  Neuro: 5/5 strength in lower extremities, 5/5 strength in upper extremities with exception of left hand grasp 4+/5. Patellar reflex 1+, cranial nerves intact. Gait slow, but requires no support. Normal finger to nose, heel to shin and  RAM.    Filed Vitals:   07/27/12 1800  BP: 138/78  Pulse: 75  Temp: 98 F (36.7 C)  Resp: 18   Procedures/Imaging:  Carotid dopplers: Bilateral 60-79% internal carotid artery stenosis, low to midrange of scale. Moderate right external carotid artery Stenosis. Bilateral antegrade vertebral flow.  ECHO: Left ventricle: The cavity size was normal. Wall thickness was increased in a pattern of moderate LVH. Systolic function was normal. The estimated ejection fraction was in the range of 60% to 65%. Wall motion was normal; there were no regional wall motion abnormalities. There was an increased relative contribution of atrial contraction to ventricular filling.   MRI:  1. No acute intracranial abnormality or significant interval  change.  2. Stable left middle cranial fossa encephalocele.  3. Stable atrophy and white matter disease matter disease.  4. Improved aeration of the sinuses bilaterally with stable  postsurgical changes.  MRA: 1. Mild small vessel disease.  2. Signal loss in the vertebral arteries bilaterally is likely  artifactual without a significant focal stenosis.  3. 2 mm left posterior communicating artery aneurysm.  Labs  CBC  Lab 07/26/12 1512  WBC 10.6*  HGB 14.8  HCT 44.5  PLT 217   BMET  Lab 07/26/12 1512  NA 138  K 3.9  CL 97  CO2 29  BUN 19  CREATININE 1.21*  CALCIUM 9.3  PROT 7.5  BILITOT 0.4  ALKPHOS 110  ALT 16  AST 24  GLUCOSE 256*   Results for orders placed during the hospital encounter of 07/26/12 (from the past 72 hour(s))  GLUCOSE, CAPILLARY     Status: Abnormal   Collection Time   07/26/12 12:42 PM  Component Value Range Comment   Glucose-Capillary 198 (*) 70 - 99 mg/dL   COMPREHENSIVE METABOLIC PANEL     Status: Abnormal   Collection Time   07/26/12  3:12 PM      Component Value Range Comment   Sodium 138  135 - 145 mEq/L    Potassium 3.9  3.5 - 5.1 mEq/L    Chloride 97  96 - 112 mEq/L    CO2 29  19 - 32 mEq/L     Glucose, Bld 256 (*) 70 - 99 mg/dL    BUN 19  6 - 23 mg/dL    Creatinine, Ser 1.21 (*) 0.50 - 1.10 mg/dL    Calcium 9.3  8.4 - 10.5 mg/dL    Total Protein 7.5  6.0 - 8.3 g/dL    Albumin 3.7  3.5 - 5.2 g/dL    AST 24  0 - 37 U/L    ALT 16  0 - 35 U/L    Alkaline Phosphatase 110  39 - 117 U/L    Total Bilirubin 0.4  0.3 - 1.2 mg/dL    GFR calc non Af Amer 49 (*) >90 mL/min    GFR calc Af Amer 57 (*) >90 mL/min   CBC     Status: Abnormal   Collection Time   07/26/12  3:12 PM      Component Value Range Comment   WBC 10.6 (*) 4.0 - 10.5 K/uL    RBC 4.93  3.87 - 5.11 MIL/uL    Hemoglobin 14.8  12.0 - 15.0 g/dL    HCT 44.5  36.0 - 46.0 %    MCV 90.3  78.0 - 100.0 fL    MCH 30.0  26.0 - 34.0 pg    MCHC 33.3  30.0 - 36.0 g/dL    RDW 14.5  11.5 - 15.5 %    Platelets 217  150 - 400 K/uL   GLUCOSE, CAPILLARY     Status: Abnormal   Collection Time   07/26/12  4:28 PM      Component Value Range Comment   Glucose-Capillary 264 (*) 70 - 99 mg/dL   GLUCOSE, CAPILLARY     Status: Abnormal   Collection Time   07/26/12  5:43 PM      Component Value Range Comment   Glucose-Capillary 235 (*) 70 - 99 mg/dL   GLUCOSE, CAPILLARY     Status: Abnormal   Collection Time   07/27/12 12:40 AM      Component Value Range Comment   Glucose-Capillary 292 (*) 70 - 99 mg/dL    Comment 1 Notify RN     HEMOGLOBIN A1C     Status: Abnormal   Collection Time   07/27/12  5:00 AM      Component Value Range Comment   Hemoglobin A1C 9.7 (*) <5.7 %    Mean Plasma Glucose 232 (*) <117 mg/dL   LIPID PANEL     Status: Abnormal   Collection Time   07/27/12  5:00 AM      Component Value Range Comment   Cholesterol 234 (*) 0 - 200 mg/dL    Triglycerides 315 (*) <150 mg/dL    HDL 33 (*) >39 mg/dL    Total CHOL/HDL Ratio 7.1      VLDL 63 (*) 0 - 40 mg/dL    LDL Cholesterol 138 (*) 0 - 99 mg/dL   GLUCOSE, CAPILLARY     Status: Abnormal   Collection Time   07/27/12  6:39  AM      Component Value Range Comment    Glucose-Capillary 245 (*) 70 - 99 mg/dL    Comment 1 Notify RN     GLUCOSE, CAPILLARY     Status: Abnormal   Collection Time   07/27/12 11:27 AM      Component Value Range Comment   Glucose-Capillary 256 (*) 70 - 99 mg/dL   GLUCOSE, CAPILLARY     Status: Abnormal   Collection Time   07/27/12  4:31 PM      Component Value Range Comment   Glucose-Capillary 216 (*) 70 - 99 mg/dL    Patient condition at time of discharge/disposition: stable  Disposition-home   Follow up issues: Continued management of vascular risk factors  Discharge follow up:  Consider follow-up with neurology given they gave no input on dispo at time of discharge LDL 138 continue pravastatin  Discharge Instructions: Please refer to Patient Instructions section of EMR for full details.  Patient was counseled important signs and symptoms that should prompt return to medical care, changes in medications, dietary instructions, activity restrictions, and follow up appointments.  Significant instructions noted below:   Discharge Medications Medication List  As of 07/28/2012 11:24 AM   START taking these medications         pravastatin 80 MG tablet   Commonly known as: PRAVACHOL   Take 1 tablet (80 mg total) by mouth daily.         CONTINUE taking these medications         amLODipine 10 MG tablet   Commonly known as: NORVASC      aspirin 325 MG tablet      insulin detemir 100 UNIT/ML injection   Commonly known as: LEVEMIR      valsartan 80 MG tablet   Commonly known as: DIOVAN          Where to get your medications    These are the prescriptions that you need to pick up.   You may get these medications from any pharmacy.         pravastatin 80 MG tablet           Tommi Rumps, MD of [facility] 07/27/2012 1:12 PM

## 2012-07-27 NOTE — Progress Notes (Signed)
Patient ID: CHLOYE SPICUZZA, female   DOB: 12-03-1956, 56 y.o.   MRN: [id number] Family Medicine Teaching Service Daily Progress Note Service Page: [id number]  Patient Assessment: Patient admitted from clinic for work-up of possible CVA with increased left sided weakness and slurring of speech.  Subjective: Patient states doing somewhat better today.  Her headache is improved and is 5/10.  States she still notices some left sided weakness, but this is improved as well.  Her speech is still mildly slurred upon initial conversation, but becomes more slurred as conversation progresses.  States is to go for MRI without contrast this morning.  Objective: Temp:  [97.5 F (36.4 C)-98.1 F (36.7 C)] 97.9 F (36.6 C) (08/20 0600) Pulse Rate:  [68-78] 72  (08/20 0600) Resp:  [16-18] 18  (08/20 0600) BP: (138-182)/(73-95) 161/77 mmHg (08/20 0600) SpO2:  [91 %-98 %] 93 % (08/20 0600) Weight:  [252 lb (114.306 kg)-254 lb 10.1 oz (115.5 kg)] 254 lb 10.1 oz (115.5 kg) (08/20 0040) Exam: General: NAD, resting comfortably in bed Respiratory: Normal work of breathing Extremities: no swelling Neuro: 5/5 strength in lower extremities, 5/5 strength in upper extremities with exception of left hand grasp 4+/5.  Patellar reflex 1+, cranial nerves intact. Gait slow, but requires no support. Normal finger to nose, heel to shin and RAM.  I have reviewed the patient's medications, labs, imaging, and diagnostic testing.  Notable results are summarized below.  CBC BMET   Lab 07/26/12 1512  WBC 10.6*  HGB 14.8  HCT 44.5  PLT 217    Lab 07/26/12 1512  NA 138  K 3.9  CL 97  CO2 29  BUN 19  CREATININE 1.21*  GLUCOSE 256*  CALCIUM 9.3     Lipid Panel     Component Value Date/Time   CHOL 234* 07/27/2012 0500   TRIG 315* 07/27/2012 0500   HDL 33* 07/27/2012 0500   CHOLHDL 7.1 07/27/2012 0500   VLDL 63* 07/27/2012 0500   LDLCALC 138* 07/27/2012 0500    Imaging/Diagnostic Tests: Carotid dopplers:  bilaterally 60-79% ICA stenosis, anterograde flow  Echo: EF 60-65%, moderate LVH, increased relative contribution of atrial contraction to ventricular filling  MRI/MRA: ordered and scheduled for today  Plan: 56 year old female with history of HTN, DM, HLD, and previous stroke here with symptoms of possible CVA vs. TIA   1. Headache/Weakness: Symptoms concerning for possible CVA especially given history and comorbidities. Will plan to admit to telemetry floor for further work-up of possible stroke. WIll obtain MRI/MRA or brain, carotid dopplers and 2-d echo. Currently on ASA therapy, will await results of MRI and consult neuro as well given this may be her 2nd stroke. If she has had a 2nd stroke, she would likely need anti platelet therapy with plavix. She has had previous CSF leak s/p repair but this does not appear to be meningitis as she has had no fever and no meningeal signs.  - MRI/MRA today - neuro recs: HgbA1c, fasting lipid panel, MRI, MRA of the brain without contrast, PT consult, OT consult, Speech consult, Echocardiogram, Carotid doppler preliminary results show a 60-79% bilateral ICA stenosis. Will await final and MRA results. Will also need results of MRI to determine if this stenosis is symptomatic or asymptomatic. Prophylactic therapy-continue ASA. Will determine need for a change in prophylactic therapy based on MRI results. Risk factor modification. Telemetry monitoring. Frequent neuro checks - PT, OT, ST ordered - started simvastatin 20 mg daily - continue ASA  2. DM:  Pretty poorly controlled so far, especially with having to switch insulin therapies. She is currently using 35 units of levemir bid. Will substitue lantus for levemir in hospital but keep at 35 units for now and add moderate SSI. Check A1C for risk stratification  - continue lantus and SSI - A1c pending  3. HTN: Will allow for permissive hypertension at this time given exam findings. Keep systolic BP [id number]. Will  slowly add back on antihypertensives in 24-48 hours. May need more aggressive antihypertensive therapy outpatient.  - BP stable, will continue to allow permissive HTN  4. HLD: Prescribed pravastatin, however has had difficulty obtaining this through the [facility] program so is currently not taking this. Could not tolerate lipitor in the past. Unable to afford simvastatin. Will check lipid panel for risk stratification, restart statin in hospital  - started on simvastatin - LDL not at goal, should be on statin as outpatient  5. Decreased urination: Subjective feeling of decreased urination. Does have mild degree of renal insufficiency and history of UA stones. Will check renal function in hospital and monitor UOP.  - Cr 1.21 appears to be at base line  6. Tobacco abuse: Will order smoking cessation counseling.   7. FEN/GI: SLIV, CHO mod medium diet once passed swallow eval   8. PPX: Heparin   9. Dispo: Pending further workup   Tommi Rumps, MD 07/27/2012, 9:06 AM

## 2012-07-27 NOTE — Progress Notes (Signed)
Stroke education and discharge instructions provided to patient. All questions answered to patient's satisfaction. Pt. D/C home with no signs of acute distress.

## 2012-07-27 NOTE — Care Management Note (Unsigned)
    Page 1 of 1   07/27/2012     1:59:46 PM   CARE MANAGEMENT NOTE 07/27/2012  Patient:  [name], [name]   Account Number:  [id number]  Date Initiated:  07/27/2012  Documentation initiated by:  [facility]  Subjective/Objective Assessment:   Admitted with weakness, TIA workup.     Action/Plan:   PT recommending outpatient PT   Anticipated DC Date:  07/28/2012   Anticipated DC Plan:  [facility]  CM consult      Choice offered to / List presented to:             Status of service:  In process, will continue to follow Medicare Important Message given?   (If response is "NO", the following Medicare IM given date fields will be blank) Date Medicare IM given:   Date Additional Medicare IM given:    Discharge Disposition:    Per UR Regulation:  Reviewed for med. necessity/level of care/duration of stay  If discussed at [place] of Stay Meetings, dates discussed:    Comments:  07/27/12 Spoke with patient about outpatient PT. Patient is agreeable to going to [facility] for outpatient PT. Faxed referral PT eval, order and Hand P to [phone] and confirmed receipt.They will contact patient and make appt. Patient plans to purchase a cane after discharge. Gave patient discount pharmacy card. Patient stated that [place] has an [place] the [place].She goes to [facility] for PCP. Will continue to follow for d/c needs. Fuller Plan RN, BSN, CCM

## 2012-07-28 NOTE — Discharge Summary (Signed)
Seen and examined on the day of DC.  Agree with Dr. Ellen Henri documentation and management.

## 2012-07-29 ENCOUNTER — Ambulatory Visit: Payer: Self-pay | Attending: Family Medicine | Admitting: Physical Therapy

## 2012-07-29 DIAGNOSIS — R5381 Other malaise: Secondary | ICD-10-CM | POA: Insufficient documentation

## 2012-07-29 DIAGNOSIS — M6281 Muscle weakness (generalized): Secondary | ICD-10-CM | POA: Insufficient documentation

## 2012-07-29 DIAGNOSIS — IMO0001 Reserved for inherently not codable concepts without codable children: Secondary | ICD-10-CM | POA: Insufficient documentation

## 2012-07-29 DIAGNOSIS — R269 Unspecified abnormalities of gait and mobility: Secondary | ICD-10-CM | POA: Insufficient documentation

## 2012-07-30 ENCOUNTER — Telehealth: Payer: Self-pay | Admitting: *Deleted

## 2012-07-30 NOTE — Telephone Encounter (Signed)
Stanton Kidney, case Freight forwarder at [facility], calling about Ms. [name].  States she was discharged from hospital was and sent for Outpatient physical therapy.   Physical Therapist called her today after their session and is concerned patient may have normal pressure hydrocephalus and wanted to touch base with Dr. Zigmund Daniel before her hospital follow up appointment with him on Monday 08/02/2012 @ 10:15.  Will forward to Dr. Zigmund Daniel for review. Stanton Kidney can be reached at [phone].  Lauralyn Primes

## 2012-08-02 ENCOUNTER — Ambulatory Visit (INDEPENDENT_AMBULATORY_CARE_PROVIDER_SITE_OTHER): Payer: Self-pay | Admitting: Family Medicine

## 2012-08-02 VITALS — BP 135/84 | HR 71 | Ht 62.5 in | Wt 250.0 lb

## 2012-08-02 DIAGNOSIS — M6281 Muscle weakness (generalized): Secondary | ICD-10-CM

## 2012-08-02 DIAGNOSIS — E119 Type 2 diabetes mellitus without complications: Secondary | ICD-10-CM

## 2012-08-02 DIAGNOSIS — R531 Weakness: Secondary | ICD-10-CM

## 2012-08-02 MED ORDER — VALSARTAN 80 MG PO TABS: 40.0000 mg | ORAL_TABLET | Freq: Every day | ORAL | Status: DC

## 2012-08-02 MED ORDER — GABAPENTIN 300 MG PO CAPS: 300.0000 mg | ORAL_CAPSULE | Freq: Three times a day (TID) | ORAL | Status: DC

## 2012-08-02 MED ORDER — INSULIN DETEMIR 100 UNIT/ML ~~LOC~~ SOLN: 45.0000 [IU] | Freq: Every day | SUBCUTANEOUS | Status: DC

## 2012-08-02 MED ORDER — INSULIN DETEMIR 100 UNIT/ML ~~LOC~~ SOLN: 55.0000 [IU] | Freq: Every day | SUBCUTANEOUS | Status: DC

## 2012-08-02 MED ORDER — ALLOPURINOL 100 MG PO TABS: 100.0000 mg | ORAL_TABLET | Freq: Two times a day (BID) | ORAL | Status: DC

## 2012-08-02 NOTE — Patient Instructions (Addendum)
Thank you for coming in today, it was good to see you I would like for you to increase your levemir to 55 units twice per day.  Be mindful of what you are eating and what may make your blood sugar go up (sweets, pastas, bread, some fruits) I am going to send in a referral to neurology for you to follow up with them.   I will see you back in 2-3 weeks.

## 2012-08-02 NOTE — Progress Notes (Signed)
Subjective:     Patient ID: [name], female   DOB: 1955/12/16, 56 y.o.   MRN: [id number]  HPI 56 yo patient with history of DM type II, CSF leak, and stroke presents to clinic for follow up after being discharged from the hospital on 07/27/2012.  She was admitted for headache of 2 weeks duration with  increased in left-sided weakness and difficulty with balance.  Since being discharged, pt states her headaches continue to occur all the time and rates them 6-7/10 (5/10 on discharge).  She has had 1 surgery to repair the CSF leak and apparently needs another.  The headaches are felt all over and currently they are worse in the back of her head.  She not tried anything to alleviate the HA, but states that the percocets she took in the hospital took the HA away completely.  She has not tried tylenol for fear of hurting her liver.  Lying flat does not help the HA.  Patient denies changes in her speech, hearing, vision, smell, balance, or strength since being discharged.  She reports minor drooling and lip numbness which is normal for her. She has been to physical therapy once since hospital discharge and plans to go 2x/wk.  She is still waiting to get her pravastatin from the [facility] program.   Diabetes: Sugars have been running 200's-300s.  Currently at 45mg  detemir twice daily.  Taking valsartan, pravastatin, and baby aspirin.    Review of Systems Denies fainting, bowel or bladder incontinence, paralysis/new weakness.    Objective:   Physical Exam General: well appearing, obese, in no acute distress Head: [place]/at Face: able to wrinkle forehead, puff out cheeks, smile, resist opening of eyes.  All facial expressions symmetric Eyes: EOM intact, pupils equal and reactive, no icterus or conjunctivitis, no nystagmus Nose: no nasal drainage Neuro: 5+ strength on R side. 4+ strength of L arm abduction, flexion, extension. 4+ strength of L leg flexion, extension, plantar flexion, & dorsiflexion. Reflexes  brisk.  Decrease sensation to microfilament test on L plantar surface.  Normal R foot sensation to microfilament test.  No dysmetria or tremor.  No dysarthria. Nml gait.     Assessment:     56 yo patient with hx of diabetes, CSF leak, and stroke recently discharged from hospital (07/27/2012) with continual headaches but no other evidence of worsening signs/symptoms.     Plan:

## 2012-08-02 NOTE — Assessment & Plan Note (Signed)
No new stroke based on MRI in hospital.  Moderate ICA stenosis on [place]. Still with persistent headache.  No meningeal signs.  PT concerned about possible normal pressure hydrocephalus, however no incontinence, confusion/delirium so it really does not fit that picture.  I think she needs to be followed by neuro given recurrent headache and previous history of stroke.  -treat HA with tylenol.  Do not exceed 3g/d -referral to neurology for further management and evaluation -f/u in 4 weeks for repeat neuro examination -return to clinic if difficulty with speech, worsening weakness, difficulty with balance, bowel or bladder incontinence

## 2012-08-02 NOTE — Assessment & Plan Note (Signed)
Diabetes: -increase detemir to 55 mg twice daily -continue to manage diet and avoid sugary foods -f/u in 4 weeks  -resend/refill medications

## 2012-08-03 ENCOUNTER — Telehealth: Payer: Self-pay | Admitting: Family Medicine

## 2012-08-03 NOTE — Telephone Encounter (Signed)
Called and left appt information with pt's Niece Katie:  Pt has an appt on August 30. 2013 @ 1230 with [facility]. She will need to pay $20 at the time of her visit and she has to call their Financial Counseling Dept @ [phone] to set up payment arrangements. If she cannot keep this appt she will need to call their office @ [phone], 24 hours in advance to cancel/reschedule this appt. Their office will send out the new patient information along with a map that has the directions to get to their facility. Information read back and understood by Joellen Jersey.Audelia Hives [place]  Pt's information faxed to [phone].[facility]elia H[facility] [place]

## 2012-08-05 ENCOUNTER — Telehealth: Payer: Self-pay | Admitting: *Deleted

## 2012-08-05 NOTE — Telephone Encounter (Signed)
Patient was seen by Dr. Zigmund Daniel on 07/31/2012.  He does not feel patient has normal pressure hydrocephalus.  Patient has had a recent CT of her head.  Lauralyn Primes

## 2012-08-05 NOTE — Telephone Encounter (Signed)
Call received from [place] at [facility] about [place] . On 08/21 they have an RX for valsartan 80 mg daily . On 08/26 Rx for 1/2 tab daily. (40 mg ). Will send message to Dr. Zigmund Daniel to please advise.

## 2012-08-06 ENCOUNTER — Ambulatory Visit: Payer: Self-pay | Admitting: Physical Therapy

## 2012-08-06 NOTE — Telephone Encounter (Signed)
Contacted Dr. Zigmund Daniel yesterday and he advised patient needs Diovan  80 mg 1/2 tab daily.  [facility] pharmacy notified.

## 2012-08-11 ENCOUNTER — Ambulatory Visit: Payer: Self-pay | Attending: Family Medicine | Admitting: Physical Therapy

## 2012-08-11 DIAGNOSIS — R5381 Other malaise: Secondary | ICD-10-CM | POA: Insufficient documentation

## 2012-08-11 DIAGNOSIS — M6281 Muscle weakness (generalized): Secondary | ICD-10-CM | POA: Insufficient documentation

## 2012-08-11 DIAGNOSIS — IMO0001 Reserved for inherently not codable concepts without codable children: Secondary | ICD-10-CM | POA: Insufficient documentation

## 2012-08-11 DIAGNOSIS — R269 Unspecified abnormalities of gait and mobility: Secondary | ICD-10-CM | POA: Insufficient documentation

## 2012-08-12 ENCOUNTER — Ambulatory Visit: Payer: Self-pay | Admitting: Physical Therapy

## 2012-08-16 ENCOUNTER — Ambulatory Visit: Payer: Self-pay | Admitting: Physical Therapy

## 2012-08-17 ENCOUNTER — Ambulatory Visit: Payer: Self-pay | Admitting: Physical Therapy

## 2012-08-18 ENCOUNTER — Ambulatory Visit: Payer: Self-pay | Admitting: Family Medicine

## 2012-08-19 ENCOUNTER — Ambulatory Visit: Payer: Self-pay | Admitting: Physical Therapy

## 2012-08-23 ENCOUNTER — Ambulatory Visit: Payer: Self-pay | Admitting: Physical Therapy

## 2012-08-24 ENCOUNTER — Ambulatory Visit: Payer: Self-pay | Admitting: Physical Therapy

## 2012-08-25 ENCOUNTER — Ambulatory Visit: Payer: Self-pay | Admitting: Rehabilitative and Restorative Service Providers"

## 2012-08-25 ENCOUNTER — Ambulatory Visit: Payer: Self-pay | Admitting: *Deleted

## 2012-08-30 ENCOUNTER — Ambulatory Visit: Payer: Self-pay | Admitting: Physical Therapy

## 2012-09-01 ENCOUNTER — Ambulatory Visit: Payer: Self-pay | Admitting: Physical Therapy

## 2012-09-03 ENCOUNTER — Encounter: Payer: Self-pay | Admitting: Family Medicine

## 2012-09-03 ENCOUNTER — Ambulatory Visit (INDEPENDENT_AMBULATORY_CARE_PROVIDER_SITE_OTHER): Payer: Self-pay | Admitting: Family Medicine

## 2012-09-03 ENCOUNTER — Telehealth: Payer: Self-pay | Admitting: *Deleted

## 2012-09-03 VITALS — BP 149/79 | HR 80 | Ht 62.5 in | Wt 248.0 lb

## 2012-09-03 DIAGNOSIS — R531 Weakness: Secondary | ICD-10-CM

## 2012-09-03 DIAGNOSIS — E119 Type 2 diabetes mellitus without complications: Secondary | ICD-10-CM

## 2012-09-03 DIAGNOSIS — N200 Calculus of kidney: Secondary | ICD-10-CM

## 2012-09-03 DIAGNOSIS — M6281 Muscle weakness (generalized): Secondary | ICD-10-CM

## 2012-09-03 DIAGNOSIS — I10 Essential (primary) hypertension: Secondary | ICD-10-CM

## 2012-09-03 LAB — COMPREHENSIVE METABOLIC PANEL
ALT: 20 U/L (ref 0–35)
AST: 22 U/L (ref 0–37)
Albumin: 4.4 g/dL (ref 3.5–5.2)
Alkaline Phosphatase: 118 U/L — ABNORMAL HIGH (ref 39–117)
BUN: 20 mg/dL (ref 6–23)
CO2: 25 mEq/L (ref 19–32)
Calcium: 9.7 mg/dL (ref 8.4–10.5)
Chloride: 98 mEq/L (ref 96–112)
Creat: 1.57 mg/dL — ABNORMAL HIGH (ref 0.50–1.10)
Glucose, Bld: 397 mg/dL — ABNORMAL HIGH (ref 70–99)
Potassium: 4.6 mEq/L (ref 3.5–5.3)
Sodium: 137 mEq/L (ref 135–145)
Total Bilirubin: 0.5 mg/dL (ref 0.3–1.2)
Total Protein: 7.3 g/dL (ref 6.0–8.3)

## 2012-09-03 LAB — POCT GLYCOSYLATED HEMOGLOBIN (HGB A1C): Hemoglobin A1C: 10.6

## 2012-09-03 MED ORDER — ALLOPURINOL 100 MG PO TABS: 100.0000 mg | ORAL_TABLET | Freq: Two times a day (BID) | ORAL | Status: DC

## 2012-09-03 NOTE — Telephone Encounter (Signed)
Returned call to [place].  Informed that patient should be on 29-month supply and can change to #60 tabs per Dr. Zigmund Daniel.  Nolene Ebbs, RN

## 2012-09-03 NOTE — Telephone Encounter (Signed)
Pharmacy received Rx for Allopurinol 100mg  #30---take 1 tab BID x 6 refills.  Pharmacy calling to verify if patient is supposed to receive a 2-week supply or if Dr. Zigmund Daniel wants patient to have 30-month supply.  Will send message to Dr. Zigmund Daniel and call pharmacy back.  Nolene Ebbs, RN

## 2012-09-05 MED ORDER — LANCETS MISC: Status: DC

## 2012-09-05 NOTE — Assessment & Plan Note (Signed)
BP stable, continue current medications.  Check CMET today.

## 2012-09-05 NOTE — Assessment & Plan Note (Signed)
Poorly controlled, she is compliant with medication but very noncompliant with diet.  I have discussed this with her on several occasions.  I will refer to diabetes education to continue to enforce this to her.  Will send in new rx for lancets.

## 2012-09-05 NOTE — Progress Notes (Signed)
  Subjective:    Patient ID: [name], female    DOB: [birth year], 56 y.o.   MRN: [id number]  HPI  1.  L Sided weakness:  Admitted to hospital last month with L sided weakness and concern for another stroke.  MRI showed no new stroke but has continued to have L sided weakness. Currently going to PT and this has improved significantly.  She does require any assistive devices for ambulation.  She is currently applying for disability.   2. HTN:  States that she is taking amlodipine and valsartan.  She does not check her blood pressure at home.  She is not currently doing anything for exercise or weight loss.  She denies chest pain, headache, nausea or vomiting, palpitations.  3.  DM:  She has lost her lancet device so has not been checking her glucose for the past two weeks.  She does endorse increased thirst and urination. She has been using levemir as prescribed.  Denies any symptoms of hypoglycemia.  She is not compliant with diet and frequently drinks sweet tea and soda.    4.  Uric acid stones:  Has not picked up allopurinol yet, states that she has not heard anything from health dept regarding this medication.  She still gets occasional pain and sediment in her urine.    Review of Systems Per HPI    Objective:   Physical Exam  Constitutional:       Obese female, nad   HENT:  Head: Normocephalic and atraumatic.  Eyes: No scleral icterus.  Neck: Neck supple.  Cardiovascular: Normal rate and regular rhythm.   Pulmonary/Chest: Effort normal and breath sounds normal.  Abdominal: Soft.  Musculoskeletal: She exhibits edema (trace).  Neurological: She is alert. No cranial nerve deficit. She displays a negative Romberg sign. Coordination and gait normal.       Mild decreased strength on L side with testing.           Assessment & Plan:

## 2012-09-05 NOTE — Assessment & Plan Note (Signed)
Improving, unsure of cause.  No new stroke of MRI.  Doing well with PT, to continue.

## 2012-09-05 NOTE — Assessment & Plan Note (Signed)
Faxed in allopurinol rx to [facility] again.

## 2012-09-07 ENCOUNTER — Ambulatory Visit: Payer: Self-pay | Attending: Family Medicine | Admitting: Physical Therapy

## 2012-09-07 DIAGNOSIS — IMO0001 Reserved for inherently not codable concepts without codable children: Secondary | ICD-10-CM | POA: Insufficient documentation

## 2012-09-07 DIAGNOSIS — M6281 Muscle weakness (generalized): Secondary | ICD-10-CM | POA: Insufficient documentation

## 2012-09-07 DIAGNOSIS — R269 Unspecified abnormalities of gait and mobility: Secondary | ICD-10-CM | POA: Insufficient documentation

## 2012-09-07 DIAGNOSIS — R5381 Other malaise: Secondary | ICD-10-CM | POA: Insufficient documentation

## 2012-09-09 ENCOUNTER — Ambulatory Visit: Payer: Self-pay | Admitting: *Deleted

## 2012-09-13 ENCOUNTER — Ambulatory Visit: Payer: Self-pay | Admitting: *Deleted

## 2012-09-15 ENCOUNTER — Ambulatory Visit: Payer: Self-pay | Admitting: *Deleted

## 2012-09-21 ENCOUNTER — Ambulatory Visit: Payer: Self-pay | Admitting: *Deleted

## 2012-09-23 ENCOUNTER — Ambulatory Visit: Payer: Self-pay | Admitting: *Deleted

## 2012-09-27 ENCOUNTER — Ambulatory Visit: Payer: Self-pay | Admitting: *Deleted

## 2012-09-29 ENCOUNTER — Ambulatory Visit: Payer: Self-pay | Admitting: Physical Therapy

## 2012-10-01 ENCOUNTER — Ambulatory Visit: Payer: Self-pay | Admitting: *Deleted

## 2012-10-05 ENCOUNTER — Encounter: Payer: Self-pay | Admitting: Family Medicine

## 2012-10-05 ENCOUNTER — Ambulatory Visit: Payer: Self-pay | Admitting: Physical Therapy

## 2012-10-05 ENCOUNTER — Ambulatory Visit (INDEPENDENT_AMBULATORY_CARE_PROVIDER_SITE_OTHER): Payer: Self-pay | Admitting: Family Medicine

## 2012-10-05 VITALS — BP 146/64 | HR 72 | Temp 97.9°F | Ht 62.5 in | Wt 246.2 lb

## 2012-10-05 DIAGNOSIS — N390 Urinary tract infection, site not specified: Secondary | ICD-10-CM

## 2012-10-05 DIAGNOSIS — E119 Type 2 diabetes mellitus without complications: Secondary | ICD-10-CM

## 2012-10-05 DIAGNOSIS — R109 Unspecified abdominal pain: Secondary | ICD-10-CM

## 2012-10-05 DIAGNOSIS — R300 Dysuria: Secondary | ICD-10-CM

## 2012-10-05 LAB — POCT URINALYSIS DIPSTICK
Bilirubin, UA: NEGATIVE
Blood, UA: NEGATIVE
Glucose, UA: 500
Ketones, UA: NEGATIVE
Nitrite, UA: POSITIVE
Protein, UA: 30
Spec Grav, UA: 1.025
Urobilinogen, UA: 0.2
pH, UA: 5.5

## 2012-10-05 LAB — COMPREHENSIVE METABOLIC PANEL
ALT: 15 U/L (ref 0–35)
AST: 21 U/L (ref 0–37)
Albumin: 3.7 g/dL (ref 3.5–5.2)
Alkaline Phosphatase: 111 U/L (ref 39–117)
BUN: 17 mg/dL (ref 6–23)
CO2: 29 mEq/L (ref 19–32)
Calcium: 8.7 mg/dL (ref 8.4–10.5)
Chloride: 97 mEq/L (ref 96–112)
Creat: 1.21 mg/dL — ABNORMAL HIGH (ref 0.50–1.10)
Glucose, Bld: 285 mg/dL — ABNORMAL HIGH (ref 70–99)
Potassium: 3.8 mEq/L (ref 3.5–5.3)
Sodium: 136 mEq/L (ref 135–145)
Total Bilirubin: 0.5 mg/dL (ref 0.3–1.2)
Total Protein: 6.6 g/dL (ref 6.0–8.3)

## 2012-10-05 LAB — POCT UA - MICROSCOPIC ONLY
Epithelial cells, urine per micros: >20
WBC, Ur, HPF, POC: >20

## 2012-10-05 LAB — CBC WITH DIFFERENTIAL/PLATELET
Basophils Absolute: 0 10*3/uL (ref 0.0–0.1)
Basophils Relative: 0 % (ref 0–1)
Eosinophils Absolute: 0.4 10*3/uL (ref 0.0–0.7)
Eosinophils Relative: 4 % (ref 0–5)
HCT: 41.2 % (ref 36.0–46.0)
Hemoglobin: 13.7 g/dL (ref 12.0–15.0)
Lymphocytes Relative: 25 % (ref 12–46)
Lymphs Abs: 2.4 10*3/uL (ref 0.7–4.0)
MCH: 29.3 pg (ref 26.0–34.0)
MCHC: 33.3 g/dL (ref 30.0–36.0)
MCV: 88 fL (ref 78.0–100.0)
Monocytes Absolute: 0.5 10*3/uL (ref 0.1–1.0)
Monocytes Relative: 5 % (ref 3–12)
Neutro Abs: 6.1 10*3/uL (ref 1.7–7.7)
Neutrophils Relative %: 66 % (ref 43–77)
Platelets: 257 10*3/uL (ref 150–400)
RBC: 4.68 MIL/uL (ref 3.87–5.11)
RDW: 14.5 % (ref 11.5–15.5)
WBC: 9.4 10*3/uL (ref 4.0–10.5)

## 2012-10-05 LAB — GLUCOSE, CAPILLARY: Glucose-Capillary: 309 mg/dL — ABNORMAL HIGH (ref 70–99)

## 2012-10-05 LAB — LIPASE: Lipase: 68 U/L (ref 0–75)

## 2012-10-05 MED ORDER — CEFTRIAXONE SODIUM 500 MG IJ SOLR
500.0000 mg | Freq: Once | INTRAMUSCULAR | Status: AC
  Administered 2012-10-05: 500 mg via INTRAMUSCULAR

## 2012-10-05 MED ORDER — CEFTRIAXONE SODIUM 250 MG IJ SOLR: 250.0000 mg | Freq: Once | INTRAMUSCULAR | Status: DC

## 2012-10-05 MED ORDER — LANCETS MISC: Status: DC

## 2012-10-05 MED ORDER — CEPHALEXIN 500 MG PO CAPS: 500.0000 mg | ORAL_CAPSULE | Freq: Three times a day (TID) | ORAL | Status: DC

## 2012-10-05 NOTE — Patient Instructions (Signed)
It was good to see you today! I am giving you a prescription for some more lancets. Please plan on coming back to see [place] tomorrow so we can make sure you aren't getting worse. You do have a urinary tract infection which we are treating by a shot today and some antibiotics to begin tomorrow.

## 2012-10-05 NOTE — Progress Notes (Signed)
Patient ID: [name], female   DOB: [birth year], 56 y.o.   MRN: [id number] Subjective: The patient is a 56 y.o. year old female who presents today for dysuria.  Week of burning with urination.  No change in urine smell, urine somewhat darker than normal.  Has had some back and pelvic discomfort that she characterizes as cramping.  Was sick over weekend with nausea/vomiting and fevers.  No increased frequency.  Still not checking blood sugars as she has not gotten new lancets  Patient's past medical, social, and family history were reviewed and updated as appropriate. History  Substance Use Topics  . Smoking status: Current Every Day Smoker -- 0.2 packs/day for 35 years    Types: Cigarettes  . Smokeless tobacco: Not on file   Comment: 07/26/2012 has decreased smoking from 2ppd; offered cessation materials; pt declines  . Alcohol Use: No   Objective:  Filed Vitals:   10/05/12 1351  BP: 146/64  Pulse: 72  Temp: 97.9 F (36.6 C)   Gen: NAD morbidly obese Abd: Soft, mild to moderate tenderness throughout.  No guarding or rebound.  No redness. Back: Mild muscle pain without CVA tenderness  Assessment/Plan: UTI, concern for abd process although the liklihood of this is low.  Will treat UTI and obtain basic labs to confirm that she does not have either high white count or evidence for pancreatitis. I will have the patient followup in one day to make certain that her abdominal exam is not worsening.  Please also see individual problems in problem list for problem-specific plans.

## 2012-10-06 ENCOUNTER — Ambulatory Visit (INDEPENDENT_AMBULATORY_CARE_PROVIDER_SITE_OTHER): Payer: Self-pay | Admitting: Family Medicine

## 2012-10-06 VITALS — BP 126/78 | HR 74 | Temp 98.2°F | Ht 62.5 in | Wt 245.0 lb

## 2012-10-06 DIAGNOSIS — E119 Type 2 diabetes mellitus without complications: Secondary | ICD-10-CM

## 2012-10-06 DIAGNOSIS — I10 Essential (primary) hypertension: Secondary | ICD-10-CM

## 2012-10-06 DIAGNOSIS — G9601 Cranial cerebrospinal fluid leak, spontaneous: Secondary | ICD-10-CM

## 2012-10-06 DIAGNOSIS — R109 Unspecified abdominal pain: Secondary | ICD-10-CM

## 2012-10-06 LAB — GLUCOSE, CAPILLARY: Glucose-Capillary: 319 mg/dL — ABNORMAL HIGH (ref 70–99)

## 2012-10-06 MED ORDER — INSULIN DETEMIR 100 UNIT/ML ~~LOC~~ SOLN: 65.0000 [IU] | Freq: Two times a day (BID) | SUBCUTANEOUS | Status: DC

## 2012-10-06 MED ORDER — GABAPENTIN 300 MG PO CAPS: 300.0000 mg | ORAL_CAPSULE | Freq: Three times a day (TID) | ORAL | Status: DC

## 2012-10-06 NOTE — Patient Instructions (Addendum)
Thank you for coming in today, it was good to see you Pick up your antibiotic, take it until it is all gone Increase your levemir to 65 units two times per day Check your sugars each morning fasting as well as after each meal.  Write the numbers you are getting down and bring them with you when you return in two weeks.  Your blood pressure looks good, continue your current medication I would like to see you in my nutrition clinic tomorrow, make an appointment to come in between 11-12 tomorrow morning.

## 2012-10-07 ENCOUNTER — Ambulatory Visit: Payer: Self-pay | Admitting: Physical Therapy

## 2012-10-07 ENCOUNTER — Ambulatory Visit (INDEPENDENT_AMBULATORY_CARE_PROVIDER_SITE_OTHER): Payer: Self-pay | Admitting: Family Medicine

## 2012-10-07 VITALS — Ht 62.5 in | Wt 245.0 lb

## 2012-10-07 DIAGNOSIS — I10 Essential (primary) hypertension: Secondary | ICD-10-CM

## 2012-10-07 DIAGNOSIS — E669 Obesity, unspecified: Secondary | ICD-10-CM

## 2012-10-07 DIAGNOSIS — E119 Type 2 diabetes mellitus without complications: Secondary | ICD-10-CM

## 2012-10-07 NOTE — Patient Instructions (Addendum)
Diet Recommendations for Diabetes   Starchy (carb) foods include: Bread, rice, pasta, potatoes, corn, crackers, bagels, muffins, all baked goods.   Protein foods include: Meat, fish, poultry, eggs, dairy foods, and beans such as pinto and kidney beans (beans also provide carbohydrate).   1. Eat at least 3 meals and 1-2 snacks per day. Never go more than 4-5 hours while awake without eating.  2. Limit starchy foods to TWO per meal and ONE per snack. ONE portion of a starchy         food is equal to the following:   - ONE slice of bread (or its equivalent, such as half of a hamburger bun).   - 1/2 cup of a "scoopable" starchy food such as potatoes or rice.   - 1 OUNCE of starchy snack foods such as crackers or pretzels (look on label).   - 15 grams of carbohydrate as shown on food label.  3. Both lunch and dinner should include a protein food, a carb food, and vegetables.   - Obtain twice as many veg's as protein or carbohydrate foods for both lunch and     dinner.   - Try to keep frozen veg's on hand for a quick vegetable serving.     - Fresh or frozen veg's are best.   For better blood pressure control limit your sodium intake to 1500mg  of sodium per day.  Increase your walking each day, try to increase a couple minutes each day with a goal to 30 minutes per day.

## 2012-10-07 NOTE — Progress Notes (Signed)
Patient ID: [name], female   DOB: 03-12-1956, 56 y.o.   MRN: [id number] Medical History:  [name] has had difficulty with diabetes control and hypertension over the past year.  She is here for nutrition therapy and to discuss her current eating patterns.   Eating Pattern: Breakfast once per week, Lunch around 1pm, Dinner 6-7pm, sometimes snack before bed.   24-hr  B ( AM)-  No breakfast Snk ( AM)- None L (12 PM)- 2 cups of green beans, 2 cups of rice   Snk ( PM)- none  D ( PM)- [employer] McRib sandwich  Snk ( PM)- None   Trying to cut out sodas-1 week since last soda. Does have a lot of juices Water intake: 4 16oz cups per day  Exercise: PT currently.  Typically walks 5 minutes each day   Typical day for her.  Recommendations Discussed dietary changes including limiting carbohydrates at meals and eating more regular meals. She is doing better in not drinking her carbohydates, but still has a lot of juices  Discussed being more consistent and not going >5 hours without a meal.   We also touched on limiting sodium and avoidance of preserved or canned vegetables. She has set a goal to increase her activity to walking 30 minutes per day Encouraged to keep appointment with diabetes management.

## 2012-10-08 ENCOUNTER — Telehealth: Payer: Self-pay | Admitting: Family Medicine

## 2012-10-08 LAB — URINE CULTURE: Colony Count: >=100000

## 2012-10-08 NOTE — Telephone Encounter (Signed)
error 

## 2012-10-11 ENCOUNTER — Ambulatory Visit: Payer: Self-pay | Admitting: *Deleted

## 2012-10-11 DIAGNOSIS — R109 Unspecified abdominal pain: Secondary | ICD-10-CM | POA: Insufficient documentation

## 2012-10-11 NOTE — Assessment & Plan Note (Signed)
?   If related to UTI, improved today.  Instructed to complete antibiotic.  If worsening of not resolving to return.  Given red flags, she expresses understanding.

## 2012-10-11 NOTE — Progress Notes (Signed)
  Subjective:    Patient ID: [name], female    DOB: [birth year], 56 y.o.   MRN: [id number]  HPI  1. F/u abd pain: Dx with UTI yesterday and started on keflex.  Abdominal pain improved today.  Pain in lower abdomen.  Denies fever, chills, blood in stool, changes in bowels.   2. DM:  Poorly controlled in the past. She is currently using levemir insulin.  She has not been checking her glucose at home because she has been out of lancets.  Given rx at appointment yesterday, plans to pick up today.  Diet is generally poor, but she has appt. With diabetes mgmt in the upcoming weeks.  Does not know very well which foods contains carbs vs. Protein.  Denies symptoms of excess thirst or increased urination.   3. HTN:  Has finally started on all medications prescribed for her hypertension.  No side effects from medications, tolerating well.  She does not practice self monitoring of her blood pressure at home.  She denies chest pain, vision changes, palpitations, headache, nausea or vomiting.  Review of Systems     Objective:   Physical Exam  Constitutional: She appears well-nourished. No distress.  Cardiovascular: Normal rate, regular rhythm and normal heart sounds.   Abdominal: Soft. Bowel sounds are normal. She exhibits no distension. There is tenderness (mild suprapubic tenderness). There is no rebound and no guarding.  Musculoskeletal: She exhibits no edema.  Neurological: She is alert.   BP 126/78  Pulse 74  Temp 98.2 F (36.8 C) (Oral)  Ht 5' 2.5" (1.588 m)  Wt 245 lb (111.131 kg)  BMI 44.10 kg/m2        Assessment & Plan:

## 2012-10-11 NOTE — Assessment & Plan Note (Signed)
Poorly controlled.  CBG 319 today, states she has not had anything to eat since last pm.  Will increase levemir to 65  Units.  Instructed to check post prandial glucose and record to bring with her at follow up.   Diet generally poor, will have her meet with me in nutrition clinic tomorrow as well as keep appt. With diabetes management.

## 2012-10-11 NOTE — Assessment & Plan Note (Signed)
Much better control with use of all medications prescribed.  Currently at goal, continue current medications.

## 2012-10-18 ENCOUNTER — Encounter: Payer: Self-pay | Admitting: Home Health Services

## 2012-10-19 ENCOUNTER — Encounter: Payer: Self-pay | Admitting: Home Health Services

## 2012-10-21 ENCOUNTER — Ambulatory Visit (INDEPENDENT_AMBULATORY_CARE_PROVIDER_SITE_OTHER): Payer: Self-pay | Admitting: Family Medicine

## 2012-10-21 ENCOUNTER — Encounter: Payer: Self-pay | Admitting: Family Medicine

## 2012-10-21 VITALS — BP 125/77 | HR 79 | Temp 98.2°F | Ht 62.5 in | Wt 245.0 lb

## 2012-10-21 DIAGNOSIS — E119 Type 2 diabetes mellitus without complications: Secondary | ICD-10-CM

## 2012-10-21 DIAGNOSIS — E1165 Type 2 diabetes mellitus with hyperglycemia: Secondary | ICD-10-CM

## 2012-10-21 DIAGNOSIS — IMO0001 Reserved for inherently not codable concepts without codable children: Secondary | ICD-10-CM

## 2012-10-21 DIAGNOSIS — Z23 Encounter for immunization: Secondary | ICD-10-CM

## 2012-10-21 MED ORDER — PRAVASTATIN SODIUM 80 MG PO TABS: 80.0000 mg | ORAL_TABLET | Freq: Every day | ORAL | Status: DC

## 2012-10-21 MED ORDER — INSULIN ASPART 100 UNIT/ML ~~LOC~~ SOLN: 5.0000 [IU] | Freq: Three times a day (TID) | SUBCUTANEOUS | Status: DC

## 2012-10-21 MED ORDER — INSULIN DETEMIR 100 UNIT/ML ~~LOC~~ SOLN: 70.0000 [IU] | Freq: Two times a day (BID) | SUBCUTANEOUS | Status: DC

## 2012-10-21 NOTE — Patient Instructions (Addendum)
Thank you for coming in today, it was good to see you Pick up novolog and start, 5 units before each meal Increase levemir to 70 units two times per day Record your sugars and return in two weeks.

## 2012-10-24 NOTE — Progress Notes (Signed)
  Subjective:    Patient ID: [name], female    DOB: 1956/07/06, 56 y.o.   MRN: [id number]  HPI  1. DM:  Here for f/u on diabetes.  Since last visit glucose has still been quite high.  Ranging from 246-400 fasting.  She is checking some after meals and typically around 250-300.  She is compliant with current insulin dosage.  She is scheduled to meet with diabetes education on 11/21.    She is taking daily ASA and on ARB.   Review of Systems Per HPI    Objective:   Physical Exam  Constitutional:       OBese female, nad   Cardiovascular: Normal rate, regular rhythm and normal heart sounds.   Musculoskeletal: She exhibits no edema.          Assessment & Plan:

## 2012-10-24 NOTE — Assessment & Plan Note (Signed)
Poorly controlled.  Following up with diabetes education.  Trying to incorporate some of the things discussed in nutrition clinic.  Will add mealtime insulin starting at 5 units with each meal and increase levemir to 70 units bid. F/u in 2 weeks.

## 2012-10-26 ENCOUNTER — Telehealth: Payer: Self-pay | Admitting: *Deleted

## 2012-10-26 NOTE — Telephone Encounter (Signed)
Received call from [facility] regarding RX for [place].  MD  Gave 10 ml and 15 refills. Comes in boxes of 15 ml which is 1500 units. Patient will need 3 boxes for a month's supply.  Gave verbal OK for this and refill X 11 for one year' s refills.  Dr. Rodman Key notified.

## 2012-10-26 NOTE — Telephone Encounter (Signed)
The  previous note was on wrong patient . Entered in error.

## 2012-10-26 NOTE — Telephone Encounter (Deleted)
Psychiatrist  from  [facility] called and left message on voicemail for physicians/ pharmacy line that patient's BP today was 183/97.    Spoke with patient and she denies any symptoms other than feeling sleepy. Suggested she come in for BP recheck today and can schedule later  appointment with PCP if needed. She cannot come this afternoon. Will come to office now.

## 2012-11-02 ENCOUNTER — Ambulatory Visit: Payer: Self-pay | Admitting: Family Medicine

## 2012-11-08 ENCOUNTER — Encounter: Payer: Self-pay | Admitting: Home Health Services

## 2012-11-08 DIAGNOSIS — Z716 Tobacco abuse counseling: Secondary | ICD-10-CM | POA: Insufficient documentation

## 2012-11-11 ENCOUNTER — Other Ambulatory Visit: Payer: Self-pay | Admitting: Family Medicine

## 2012-11-11 DIAGNOSIS — Z1231 Encounter for screening mammogram for malignant neoplasm of breast: Secondary | ICD-10-CM

## 2012-11-15 ENCOUNTER — Encounter: Payer: Self-pay | Admitting: Family Medicine

## 2012-11-15 ENCOUNTER — Ambulatory Visit (INDEPENDENT_AMBULATORY_CARE_PROVIDER_SITE_OTHER): Payer: No Typology Code available for payment source | Admitting: Family Medicine

## 2012-11-15 VITALS — BP 143/85 | HR 73 | Wt 245.9 lb

## 2012-11-15 DIAGNOSIS — IMO0001 Reserved for inherently not codable concepts without codable children: Secondary | ICD-10-CM

## 2012-11-15 DIAGNOSIS — E1165 Type 2 diabetes mellitus with hyperglycemia: Secondary | ICD-10-CM

## 2012-11-15 MED ORDER — INSULIN ASPART 100 UNIT/ML ~~LOC~~ SOLN: 5.0000 [IU] | Freq: Three times a day (TID) | SUBCUTANEOUS | Status: DC

## 2012-11-15 NOTE — Patient Instructions (Addendum)
Thank you for coming in today, it was good to see you Start using the novolog before meals, check your sugar 1-2 hours after eating Be sure to get regular meals, no longer than 5 hours between meals Follow up with diabetes education See me again in 3 weeks or sooner as needed.

## 2012-11-22 NOTE — Progress Notes (Signed)
  Subjective:    Patient ID: [name], female    DOB: 1956-09-25, 56 y.o.   MRN: [id number]  HPI  1.  DM:  Here for interval follow up of diabetes.  She states that she still has not started taking novolog yet because they did not have this for her at [facility].  She is using Levemir 70 units BID.  Her fasting glucose has been around 250 typically. She was scheduled to go to diabetes care management recently however did not go due to her sister being in the hospital.  She does plan to go next month.  She has not changed her diet other than cutting out sweetened beverages.  24 hour food recall reveals that she only ate one meal yesterday consisting of a chicken pie.    Review of Systems Per HPI    Objective:   Physical Exam  Constitutional:       Morbidly obese, nad   HENT:  Head: Normocephalic and atraumatic.  Cardiovascular: Normal rate and regular rhythm.   Pulmonary/Chest: Effort normal and breath sounds normal.  Musculoskeletal: She exhibits no edema.          Assessment & Plan:

## 2012-11-22 NOTE — Assessment & Plan Note (Signed)
Poorly controlled, have been titrating up levemir.  Novolog sent back in, instructed to pick up.  Discussed getting regular meals especially when starting novolog.

## 2012-12-09 ENCOUNTER — Encounter: Payer: Self-pay | Admitting: Family Medicine

## 2012-12-09 ENCOUNTER — Ambulatory Visit (INDEPENDENT_AMBULATORY_CARE_PROVIDER_SITE_OTHER): Payer: No Typology Code available for payment source | Admitting: Family Medicine

## 2012-12-09 ENCOUNTER — Inpatient Hospital Stay (HOSPITAL_COMMUNITY)
Admission: AD | Admit: 2012-12-09 | Discharge: 2012-12-09 | Disposition: A | Discharge status: Home / Self Care | Payer: No Typology Code available for payment source | Source: Ambulatory Visit | Attending: Obstetrics & Gynecology | Admitting: Obstetrics & Gynecology

## 2012-12-09 ENCOUNTER — Encounter (HOSPITAL_COMMUNITY): Payer: Self-pay | Admitting: *Deleted

## 2012-12-09 VITALS — BP 140/80 | HR 87 | Ht 62.5 in | Wt 240.0 lb

## 2012-12-09 DIAGNOSIS — E1165 Type 2 diabetes mellitus with hyperglycemia: Secondary | ICD-10-CM

## 2012-12-09 DIAGNOSIS — R32 Unspecified urinary incontinence: Secondary | ICD-10-CM | POA: Insufficient documentation

## 2012-12-09 DIAGNOSIS — E119 Type 2 diabetes mellitus without complications: Secondary | ICD-10-CM | POA: Insufficient documentation

## 2012-12-09 DIAGNOSIS — IMO0001 Reserved for inherently not codable concepts without codable children: Secondary | ICD-10-CM

## 2012-12-09 DIAGNOSIS — F329 Major depressive disorder, single episode, unspecified: Secondary | ICD-10-CM

## 2012-12-09 DIAGNOSIS — N949 Unspecified condition associated with female genital organs and menstrual cycle: Secondary | ICD-10-CM | POA: Insufficient documentation

## 2012-12-09 DIAGNOSIS — J449 Chronic obstructive pulmonary disease, unspecified: Secondary | ICD-10-CM | POA: Insufficient documentation

## 2012-12-09 DIAGNOSIS — B373 Candidiasis of vulva and vagina: Secondary | ICD-10-CM

## 2012-12-09 DIAGNOSIS — R109 Unspecified abdominal pain: Secondary | ICD-10-CM | POA: Insufficient documentation

## 2012-12-09 DIAGNOSIS — R531 Weakness: Secondary | ICD-10-CM

## 2012-12-09 DIAGNOSIS — J4489 Other specified chronic obstructive pulmonary disease: Secondary | ICD-10-CM | POA: Insufficient documentation

## 2012-12-09 DIAGNOSIS — F32A Depression, unspecified: Secondary | ICD-10-CM

## 2012-12-09 DIAGNOSIS — B3731 Acute candidiasis of vulva and vagina: Secondary | ICD-10-CM | POA: Insufficient documentation

## 2012-12-09 DIAGNOSIS — M6281 Muscle weakness (generalized): Secondary | ICD-10-CM

## 2012-12-09 LAB — WET PREP, GENITAL
Trich, Wet Prep: NONE SEEN
Yeast Wet Prep HPF POC: NONE SEEN

## 2012-12-09 LAB — URINE MICROSCOPIC-ADD ON

## 2012-12-09 LAB — URINALYSIS, ROUTINE W REFLEX MICROSCOPIC
Glucose, UA: 500 mg/dL — AB
Hgb urine dipstick: NEGATIVE
Ketones, ur: 15 mg/dL — AB
Nitrite: NEGATIVE
Protein, ur: 30 mg/dL — AB
Specific Gravity, Urine: >1.03 — ABNORMAL HIGH (ref 1.005–1.030)
Urobilinogen, UA: 0.2 mg/dL (ref 0.0–1.0)
pH: 5 (ref 5.0–8.0)

## 2012-12-09 LAB — POCT GLYCOSYLATED HEMOGLOBIN (HGB A1C): Hemoglobin A1C: 12.5

## 2012-12-09 MED ORDER — FLUCONAZOLE 150 MG PO TABS: 150.0000 mg | ORAL_TABLET | Freq: Once | ORAL | Status: DC

## 2012-12-09 MED ORDER — NYSTATIN-TRIAMCINOLONE 100000-0.1 UNIT/GM-% EX OINT: TOPICAL_OINTMENT | Freq: Two times a day (BID) | CUTANEOUS | Status: DC

## 2012-12-09 MED ORDER — INSULIN ASPART 100 UNIT/ML ~~LOC~~ SOLN: 8.0000 [IU] | Freq: Three times a day (TID) | SUBCUTANEOUS | Status: DC

## 2012-12-09 MED ORDER — TERCONAZOLE 0.4 % VA CREA: 1.0000 | TOPICAL_CREAM | Freq: Every day | VAGINAL | Status: DC

## 2012-12-09 MED ORDER — INSULIN DETEMIR 100 UNIT/ML ~~LOC~~ SOLN: 75.0000 [IU] | Freq: Two times a day (BID) | SUBCUTANEOUS | Status: DC

## 2012-12-09 NOTE — MAU Note (Signed)
Patient states she has had a bad cough and has been wearing a pad for urine leaking. Has had vaginal itching with white discharge. States she is feeling lower abdominal pressure. While itching she felt something at the vaginal opening and is afraid that her bladder had dropped.

## 2012-12-09 NOTE — Patient Instructions (Addendum)
Thank you for coming in today, it was good to see you Have the doctor at [facility] send over their notes Increase your novolog to 8 units with each meal Increase levemir to 75 units two times per day Make appointment with diabetes education   REMINDER Diet Recommendations for Diabetes   Starchy (carb) foods include: Bread, rice, pasta, potatoes, corn, crackers, bagels, muffins, all baked goods.   Protein foods include: Meat, fish, poultry, eggs, dairy foods, and beans such as pinto and kidney beans (beans also provide carbohydrate).   1. Eat at least 3 meals and 1-2 snacks per day. Never go more than 4-5 hours while awake without eating.  2. Limit starchy foods to TWO per meal and ONE per snack. ONE portion of a starchy  food is equal to the following:   - ONE slice of bread (or its equivalent, such as half of a hamburger bun).   - 1/2 cup of a "scoopable" starchy food such as potatoes or rice.   - 1 OUNCE (28 grams) of starchy snack foods such as crackers or pretzels (look on label).   - 15 grams of carbohydrate as shown on food label.  3. Both lunch and dinner should include a protein food, a carb food, and vegetables.   - Obtain twice as many veg's as protein or carbohydrate foods for both lunch and dinner.   - Try to keep frozen veg's on hand for a quick vegetable serving.     - Fresh or frozen veg's are best.  4. Breakfast should always include protein.

## 2012-12-09 NOTE — MAU Provider Note (Signed)
History     CSN: [id number]  Arrival date and time: 12/09/12 1219   First Provider Initiated Contact with Patient 12/09/12 1322      Chief Complaint  Patient presents with  . Abdominal Pain  . Vaginal Discharge   HPI [name] is 57 y.o. G0P0 presents with concern for bladder dropping.  Has COPD and has been coughing a lot and is leaking urine.  Now has vaginal irritation from being wet, now itching and burning.  "rash" down onto the thigh. Hx hysterectomy, denies vaginal bleeding.   Saw Dr. Zigmund Daniel at [facility] today but was not comfortable discussing this with him.  Has multiple medical conditions that he is following.      Past Medical History  Diagnosis Date  . HTN (hypertension)     takes Amlodipine daily  . Hyperlipidemia     takes Pravastatin daily  . Dizziness     related to CSF leakage  . Hx of gout   . Diarrhea   . Urinary frequency   . Urinary urgency   . Anxiety     doesn't take any meds for this  . Kidney stones   . Complication of anesthesia 01/2012    reports she extubated herself  . Asthma   . COPD (chronic obstructive pulmonary disease)   . History of chronic bronchitis 07/26/2012    "just about q winter"  . Shortness of breath     "lying down; w/exertion; during heat"  . OSA (obstructive sleep apnea) 07/26/2012    denies CPAP  . Type II diabetes mellitus   . Headache     related to CSF leakage  . Stroke 03/2004    denies residual 07/26/2012  . Arthritis     knuckles  . Claustrophobia 07/26/2012    Past Surgical History  Procedure Date  . Nm myoview ltd 08/25/2011    Low risk study, no evidence of ischemia, EF 44%  . Sphenoidectomy 01/29/2012    Procedure: SPHENOIDECTOMY;  Surgeon: Ruby Cola, MD;  Location: [facility];  Service: ENT;  Laterality: Left;  REPAIR OF LEFT SPHENOID    . Nasal sinus surgery 01/29/2012    Procedure: ENDOSCOPIC SINUS SURGERY WITH STEALTH;  Surgeon: Ruby Cola, MD;  Location: [facility];  Service: ENT;  Laterality: N/A;    . Total abdominal hysterectomy 1994  . Refractive surgery ~ 2010    left    Family History  Problem Relation Age of Onset  . Colon cancer Sister 66  . Breast cancer Mother 71  . Diabetes Sister 53  . Diabetes Sister 61  . Lung cancer Father   . Anesthesia problems Neg Hx   . Hypotension Neg Hx   . Malignant hyperthermia Neg Hx   . Pseudochol deficiency Neg Hx     History  Substance Use Topics  . Smoking status: Current Every Day Smoker -- 0.2 packs/day for 35 years    Types: Cigarettes  . Smokeless tobacco: Not on file     Comment: 07/26/2012 has decreased smoking from 2ppd; offered cessation materials; pt declines  . Alcohol Use: No    Allergies:  Allergies  Allergen Reactions  . Atorvastatin Other (See Comments)    "legs cramped; moderate to almost severe"  . Metformin And Related Nausea And Vomiting  . Vicodin (Hydrocodone-Acetaminophen) Nausea And Vomiting  . Ace Inhibitors Cough    Prescriptions prior to admission  Medication Sig Dispense Refill  . amLODipine (NORVASC) 10 MG tablet Take 10 mg by  mouth daily.      . cephALEXin (KEFLEX) 500 MG capsule Take 1 capsule (500 mg total) by mouth 3 (three) times daily.  15 capsule  0  . gabapentin (NEURONTIN) 300 MG capsule Take 1 capsule (300 mg total) by mouth 3 (three) times daily.  90 capsule  3  . insulin aspart (NOVOLOG PENFILL) 100 UNIT/ML injection Inject 8 Units into the skin 3 (three) times daily before meals. Please dispense pens  15 mL  12  . insulin detemir (LEVEMIR FLEXPEN) 100 UNIT/ML injection Inject 75 Units into the skin 2 (two) times daily.  10 mL  15  . Lancets MISC Uses lancets to check fasting blood glucose daily  100 each  3  . pravastatin (PRAVACHOL) 80 MG tablet Take 1 tablet (80 mg total) by mouth daily.  30 tablet  2  . valsartan (DIOVAN) 80 MG tablet Take 80 mg by mouth daily.        Review of Systems  Gastrointestinal: Negative for abdominal pain.  Genitourinary: Hematuria: vaginal and  external irritation.       External and vaginal irritation   Physical Exam   Blood pressure 137/70, pulse 85, temperature 97.9 F (36.6 C), temperature source Oral, resp. rate 20, height 5' 3.5" (1.613 m), weight 241 lb 9.6 oz (109.589 kg), SpO2 100.00%.  Physical Exam  Constitutional: She is oriented to person, place, and time. She appears well-developed and well-nourished. No distress.  HENT:  Head: Normocephalic.  Neck: Normal range of motion.  Cardiovascular: Normal rate.   Respiratory: Effort normal.  GI: Soft. There is no tenderness.  Genitourinary: There is rash and tenderness on the right labia. There is rash and tenderness on the left labia. Right adnexum displays no mass, no tenderness and no fullness. Left adnexum displays no mass, no tenderness and no fullness. There is erythema and tenderness around the vagina. No bleeding around the vagina. No foreign body around the vagina. Vaginal discharge found.       Slight descent of bladder with gentle bearing down.  Bladder does not come to the introitus  Neurological: She is alert and oriented to person, place, and time.  Skin: Skin is warm and dry.  Psychiatric: She has a normal mood and affect. Her behavior is normal.   Results for orders placed during the hospital encounter of 12/09/12 (from the past 24 hour(s))  URINALYSIS, ROUTINE W REFLEX MICROSCOPIC     Status: Abnormal   Collection Time   12/09/12 12:50 PM      Component Value Range   Color, Urine YELLOW  YELLOW   APPearance CLEAR  CLEAR   Specific Gravity, Urine >1.030 (*) 1.005 - 1.030   pH 5.0  5.0 - 8.0   Glucose, UA 500 (*) NEGATIVE mg/dL   Hgb urine dipstick NEGATIVE  NEGATIVE   Bilirubin Urine SMALL (*) NEGATIVE   Ketones, ur 15 (*) NEGATIVE mg/dL   Protein, ur 30 (*) NEGATIVE mg/dL   Urobilinogen, UA 0.2  0.0 - 1.0 mg/dL   Nitrite NEGATIVE  NEGATIVE   Leukocytes, UA TRACE (*) NEGATIVE  URINE MICROSCOPIC-ADD ON     Status: Abnormal   Collection Time    12/09/12 12:50 PM      Component Value Range   Squamous Epithelial / LPF MANY (*) RARE   WBC, UA 11-20  <3 WBC/hpf   RBC / HPF 3-6  <3 RBC/hpf   Bacteria, UA MANY (*) RARE   Urine-Other FEW YEAST    WET  PREP, GENITAL     Status: Abnormal   Collection Time   12/09/12  1:31 PM      Component Value Range   Yeast Wet Prep HPF POC NONE SEEN  NONE SEEN   Trich, Wet Prep NONE SEEN  NONE SEEN   Clue Cells Wet Prep HPF POC FEW (*) NONE SEEN   WBC, Wet Prep HPF POC FEW (*) NONE SEEN   MAU Course  Procedures  MDM   Assessment and Plan  A:  Labial yeast      Yeast vaginitis     Diabetes     Urinary incontinence       P:  Mycolog Cream to external area of irritation bid prn      Diflucan 150mg  po now and repeat in 3 days      Terazol 7 vaginal cream 1 applicator at hs X 1 week     Refer GYN CLINIC for follow up and evaluation of cystocele  KEY,EVE M 12/09/2012, 1:24 PM

## 2012-12-09 NOTE — MAU Provider Note (Signed)
Attestation of Attending Supervision of Advanced Practitioner (CNM/NP): Evaluation and management procedures were performed by the Advanced Practitioner under my supervision and collaboration.  I have reviewed the Advanced Practitioner's note and chart, and I agree with the management and plan.  HARRAWAY-SMITH, Somara Frymire 5:47 PM

## 2012-12-10 LAB — URINE CULTURE: Colony Count: 35000

## 2012-12-12 DIAGNOSIS — F32A Depression, unspecified: Secondary | ICD-10-CM | POA: Insufficient documentation

## 2012-12-12 DIAGNOSIS — F329 Major depressive disorder, single episode, unspecified: Secondary | ICD-10-CM | POA: Insufficient documentation

## 2012-12-12 NOTE — Assessment & Plan Note (Signed)
Diabetes remains poorly controlled.  Will increase levemir to 75 units BID and novolog to 8 units with each meal.  Encouraged to eat regular meals and avoid fruit juices.  Recently started on topamax by neuro for headaches, this has curbed her appetite some.

## 2012-12-12 NOTE — Assessment & Plan Note (Signed)
Being followed by psych now, started on prozac and trazodone.  Tolerating well.

## 2012-12-12 NOTE — Assessment & Plan Note (Signed)
Being followed by neurology at [facility] now, sounds like concern for possible elevated CSF pressure given that they are planning on doing LP in the near future.  She does not endorse confusion/memory loss or incontinence making normal pressure hydrocephalus less likely.

## 2012-12-12 NOTE — Progress Notes (Signed)
  Subjective:    Patient ID: [name], female    DOB: 1956-05-24, 57 y.o.   MRN: [id number]  HPI 1. DM:  Following up on diabetes.  Glucose still remains high despite adding novolog.  She is averaging 200-230 fasting with post prandial in the mid 300's.  She is very non-compliant with her diet and drinks a lot of fruit juice.  She still has not been to diabetes education class. Does endorse increased thirst.  2. Weakness:  Finally was able to see neuro at [facility].  They are planning on doing an LP to evaluated CSF pressure as this may be contributing to her symptoms of unsteady gait and headaches.  Their thinking is previous surgery may have be causing elevated CSF pressure. She denies further rhinorrhea at this time, urinary incontinence or memory loss.   3.  Psych:  Applied for disability and was sent to psychiatrist for evaluation.  Dx with MDD and sleep disorder.  Was started on prozac and trazodone.     Review of Systems Per HPI    Objective:   Physical Exam  Constitutional:       Morbidly obese female, nad   HENT:  Head: Normocephalic and atraumatic.  Nose: Nose normal.  Cardiovascular: Normal rate and regular rhythm.   Musculoskeletal: She exhibits no edema.  Psychiatric: She has a normal mood and affect. Her behavior is normal.          Assessment & Plan:

## 2012-12-17 ENCOUNTER — Ambulatory Visit: Payer: Self-pay

## 2013-01-17 ENCOUNTER — Ambulatory Visit (INDEPENDENT_AMBULATORY_CARE_PROVIDER_SITE_OTHER): Payer: Self-pay | Admitting: Family Medicine

## 2013-01-17 ENCOUNTER — Encounter: Payer: Self-pay | Admitting: Family Medicine

## 2013-01-17 VITALS — BP 148/80 | HR 80 | Temp 99.0°F | Ht 62.5 in | Wt 239.5 lb

## 2013-01-17 DIAGNOSIS — R51 Headache: Secondary | ICD-10-CM | POA: Insufficient documentation

## 2013-01-17 DIAGNOSIS — J069 Acute upper respiratory infection, unspecified: Secondary | ICD-10-CM | POA: Insufficient documentation

## 2013-01-17 DIAGNOSIS — F329 Major depressive disorder, single episode, unspecified: Secondary | ICD-10-CM

## 2013-01-17 DIAGNOSIS — R519 Headache, unspecified: Secondary | ICD-10-CM | POA: Insufficient documentation

## 2013-01-17 DIAGNOSIS — M25519 Pain in unspecified shoulder: Secondary | ICD-10-CM | POA: Insufficient documentation

## 2013-01-17 DIAGNOSIS — E1165 Type 2 diabetes mellitus with hyperglycemia: Secondary | ICD-10-CM

## 2013-01-17 DIAGNOSIS — F32A Depression, unspecified: Secondary | ICD-10-CM

## 2013-01-17 DIAGNOSIS — E119 Type 2 diabetes mellitus without complications: Secondary | ICD-10-CM

## 2013-01-17 DIAGNOSIS — IMO0001 Reserved for inherently not codable concepts without codable children: Secondary | ICD-10-CM

## 2013-01-17 MED ORDER — LURASIDONE HCL 20 MG PO TABS: 20.0000 mg | ORAL_TABLET | Freq: Every day | ORAL | Status: DC

## 2013-01-17 MED ORDER — INSULIN DETEMIR 100 UNIT/ML ~~LOC~~ SOLN: 90.0000 [IU] | Freq: Two times a day (BID) | SUBCUTANEOUS | Status: DC

## 2013-01-17 NOTE — Assessment & Plan Note (Signed)
Being worked up by neurology.  Advised her to contact them for next steps since lp was unsuccessful. No signs of neurological deficits.

## 2013-01-17 NOTE — Patient Instructions (Addendum)
Thank you for coming in today, it was good to see you Increase your lantus to 90 units twice per day, I will send in a new prescription for this Take diovan 80mg  daily Once you have the orange card I will send in a referral for opthalmology. I think your congestion is being caused by a virus.  Be sure to rest and drink plenty of non-sweetened beverages.   I will place a referral to physical therapy for your shoulder Follow up in one month

## 2013-01-17 NOTE — Assessment & Plan Note (Signed)
Symptoms consistent with viral uri.  Advised symptomatic and supportive therapy.  Return if fails to improve or worsening symptoms.

## 2013-01-17 NOTE — Addendum Note (Signed)
Addended by: Luetta Nutting on: 01/17/2013 10:44 PM   Modules accepted: Orders, Medications

## 2013-01-17 NOTE — Progress Notes (Signed)
  Subjective:    Patient ID: [name], female    DOB: Oct 09, 1956, 57 y.o.   MRN: [id number]  Cough   1. Chest cold: Cough and congestion x5 days.  Denies fever, chills, nausea or vomiting, sputum production.  Has not tried anything for treatment of her symptoms.   2. Arm pain:  L arm pain coming from neck and radiating down arm.  Pain comes and goes, typically once per month.  She denies weakness, numbness or tingling into arm.    3. Anxiety/depression:  Being followed by [facility].  Currently on prozac, thinks that this was increased to 30mg .  Also just given rx for Latuda but has not gotten filled yet.   Feels like her symptoms are a little better controlled.   4. DM:  CHRONIC DIABETES  Disease Monitoring  Blood Sugar Ranges: 215-235, nothing lower than 200.  Polyuria: no   Visual problems: yes   Medication Compliance: yes  Medication Side Effects  Hypoglycemia: no   Preventitive Health Care  Eye Exam: Still with continued vision problems.  Has outstanding bill with Dr. Katy Fitch that she can not afford.   Diet pattern: Has made some improvements to diet.  Has cut out sweetened beverages.  She has also cut back on pastas and other carbohydrates.  She is trying to eat three meals per day including breakfast even if it is just an egg and a piece of toast.   Exercise: She continues to do minimal walking.  Has not rescheduled with diabetes education.     5. Headaches:  Still with persistent headache.  Being followed by neurology at [facility], who still thinks this may be related to increased CSF.  Went for LP a couple of weeks ago but unable to tolerate.  They are trying a transabdominal approach coming up but have not scheduled her to have this done.    History reviewed and updated as needed.  Smoking history reviewed, continues to smoke but trying to cut back.   Review of Systems  Respiratory: Positive for cough.    Per HPI    Objective:   Physical Exam  Constitutional: She  appears well-nourished. No distress.  Morbidly obese female   HENT:  Head: Normocephalic and atraumatic.  Mouth/Throat: Oropharynx is clear and moist.  No nasal discharge seen.   Neck: Neck supple.  Cardiovascular: Normal rate and regular rhythm.   Pulmonary/Chest: Effort normal and breath sounds normal. No respiratory distress. She has no wheezes.  Musculoskeletal:  Neck with decreased rom.  Shoulder with limited ROM in all places but normal strength.  Negative spurlings.  Mildly positive impingement sign.  No drop arm.   Lymphadenopathy:    She has no cervical adenopathy.  Psychiatric: She has a normal mood and affect. Her behavior is normal.          Assessment & Plan:

## 2013-01-17 NOTE — Assessment & Plan Note (Signed)
Possibly due to some impingement vs cervical radiculopathy.  Difficult to get a good exam due to habitus.  Will refer to PT to see if this is helpful for her. Can consider injection if not helpful.

## 2013-01-17 NOTE — Assessment & Plan Note (Addendum)
Followed by [facility] [employer].  Will add latuda to med list.  Instructed to monitor glucose closely when starting this medication.

## 2013-01-17 NOTE — Assessment & Plan Note (Signed)
Continue titrating levemir up to 90 units bid.  Follow up in one month.

## 2013-02-16 ENCOUNTER — Encounter: Payer: Self-pay | Admitting: Family Medicine

## 2013-02-16 ENCOUNTER — Ambulatory Visit (INDEPENDENT_AMBULATORY_CARE_PROVIDER_SITE_OTHER): Payer: Medicaid Other | Admitting: Family Medicine

## 2013-02-16 DIAGNOSIS — R0683 Snoring: Secondary | ICD-10-CM

## 2013-02-16 DIAGNOSIS — R0609 Other forms of dyspnea: Secondary | ICD-10-CM

## 2013-02-16 DIAGNOSIS — Z7189 Other specified counseling: Secondary | ICD-10-CM

## 2013-02-16 DIAGNOSIS — F17200 Nicotine dependence, unspecified, uncomplicated: Secondary | ICD-10-CM

## 2013-02-16 DIAGNOSIS — I10 Essential (primary) hypertension: Secondary | ICD-10-CM

## 2013-02-16 DIAGNOSIS — IMO0001 Reserved for inherently not codable concepts without codable children: Secondary | ICD-10-CM

## 2013-02-16 DIAGNOSIS — R0989 Other specified symptoms and signs involving the circulatory and respiratory systems: Secondary | ICD-10-CM

## 2013-02-16 DIAGNOSIS — Z716 Tobacco abuse counseling: Secondary | ICD-10-CM

## 2013-02-16 DIAGNOSIS — E1165 Type 2 diabetes mellitus with hyperglycemia: Secondary | ICD-10-CM

## 2013-02-16 DIAGNOSIS — R42 Dizziness and giddiness: Secondary | ICD-10-CM | POA: Insufficient documentation

## 2013-02-16 DIAGNOSIS — E119 Type 2 diabetes mellitus without complications: Secondary | ICD-10-CM

## 2013-02-16 DIAGNOSIS — R51 Headache: Secondary | ICD-10-CM

## 2013-02-16 MED ORDER — INSULIN GLARGINE 100 UNIT/ML ~~LOC~~ SOLN: 90.0000 [IU] | Freq: Two times a day (BID) | SUBCUTANEOUS | Status: DC

## 2013-02-16 NOTE — Assessment & Plan Note (Signed)
Neurology following at [facility], however unable to get LP.  Instructed to call back to see what they want her to do next.  If unable to get in touch with them told her to call me back and we will try to help get this set up.

## 2013-02-16 NOTE — Assessment & Plan Note (Signed)
BP well controlled, continue current medications

## 2013-02-16 NOTE — Assessment & Plan Note (Addendum)
Recently approved for disability and now has medicaid, feels like she had better control with lantus.  Will change levemir back to lantus at same dosage. She still has not been to diabetes education.   Discussed having her follow up again at nutrition clinic which she is amenable to.  Follow up with me in 4 weeks.

## 2013-02-16 NOTE — Assessment & Plan Note (Signed)
Has desire to quit, will refer to pharmacy clinic.

## 2013-02-16 NOTE — Patient Instructions (Signed)
General Instructions: Thank you for coming in today, it was good to see you Schedule an appointment with nutrition clinic tomorrow for 12pm Schedule and appointment with our pharmacy clinic to discuss smoking cessation Make your eye exam appointment.  I will place an order for a sleep study Follow up with your neurologist at [facility] taking lantus instead of levemir at the same dose.  Follow up with me in 4 weeks.    Treatment Goals:  Goals (1 Years of Data) as of 02/16/13         As of Today 01/17/13 12/09/12 12/09/12 12/09/12     Blood Pressure    . Blood Pressure < 130/80  139/81 148/80 135/78 137/70 140/80     Lifestyle    . Quit smoking / using tobacco            Progress Toward Treatment Goals:  Treatment Goal 02/16/2013  Hemoglobin A1C improved  Blood pressure improved  Stop smoking smoking less    Self Care Goals & Plans:  Self Care Goal 02/16/2013  Manage my medications take my medicines as prescribed; bring my medications to every visit  Monitor my health keep track of my blood glucose; bring my glucose meter and log to each visit; keep track of my weight; check my feet daily  Eat healthy foods drink diet soda or water instead of juice or soda; eat more vegetables; eat foods that are low in salt; eat baked foods instead of fried foods; eat fruit for snacks and desserts  Be physically active find an activity I enjoy; take a walk every day  Stop smoking go to the [employer] (https://scott-booker.info/); call [facility] (1-800-QUIT-NOW); set a quit date and stop smoking    Home Blood Glucose Monitoring 02/16/2013  Check my blood sugar 5 times a day  When to check my blood sugar before breakfast; after breakfast; after lunch; after dinner; at bedtime     Care Management & Community Referrals:  Referral 02/16/2013  Referrals made for care management support nutritionist; health educator; pharmacy clinic  Referrals made to community resources smoking cessation;  nutrition

## 2013-02-16 NOTE — Progress Notes (Signed)
  Subjective:    Patient ID: [name], female    DOB: February 04, 1956, 57 y.o.   MRN: [id number]  HPI  1. DM:  CHRONIC DIABETES  Disease Monitoring  Blood Sugar Ranges: 270's post prandial, 250's fasting.  As high as 557 after eating cake the  other night  Polyuria: no   Visual problems: yes   Medication Compliance: yes  Medication Side Effects  Hypoglycemia: no   Preventitive Health Care  Eye Exam: Plans to make appt  Diet pattern:Poor  Exercise: Minimal    2. Dizziness:  Reports continued dizziness especially with ambulating.  She is being worked up by neurology at [facility], with concern about developing pseudotumor,  but was unable to tolerate a LP previously.  She has tried to call back to see what the next steps would be but has not heard anything back.    3. BP: CHRONIC HYPERTENSION  Disease Monitoring  Blood pressure range: No self monitoring  Chest pain: no   Dyspnea: no   Claudication: no   Medication compliance: yes  Medication Side Effects  Lightheadedness: no   Urinary frequency: yes   Edema: yes, occasionally    4. Tobacco abuse:  Down to 3-4 cigarettes per day.  Has desire to quit.    5. Obesity:  Weight starting to go back up. Has not really incorporated anything that was discussed at previous nutrition session.  She does report frequent snoring and worse headache in the morning.     History   Social History  . Marital Status: Single    Spouse Name: N/A    Number of Children: N/A  . Years of Education: N/A   Occupational History  . Not on file.   Social History Main Topics  . Smoking status: Current Every Day Smoker -- 0.25 packs/day for 35 years    Types: Cigarettes  . Smokeless tobacco: Not on file     Comment: 07/26/2012 has decreased smoking from 2ppd; offered cessation materials; pt declines  . Alcohol Use: No  . Drug Use: No  . Sexually Active: No   Other Topics Concern  . Not on file   Social History Narrative   Currently unemployed.  Really wants to find a job, but has been difficult for her.  No children, not married     Review of Systems Per HPI    Objective:   Physical Exam  Constitutional: She appears well-nourished. No distress.  HENT:  Head: Normocephalic and atraumatic.  Neck: Neck supple.  Cardiovascular: Normal rate and regular rhythm.   Pulmonary/Chest: Breath sounds normal.  Musculoskeletal: She exhibits no edema.  Neurological: She is alert. Coordination (Negative rhomberg, normal gait) normal.          Assessment & Plan:

## 2013-02-17 ENCOUNTER — Ambulatory Visit: Payer: Medicaid Other | Admitting: Family Medicine

## 2013-02-17 ENCOUNTER — Telehealth: Payer: Self-pay | Admitting: Family Medicine

## 2013-02-17 NOTE — Telephone Encounter (Signed)
Patient would like to speak to [name] about the Lantus that was sent in for her.

## 2013-02-17 NOTE — Telephone Encounter (Signed)
Called pt. Number busy. Will try later. [name], [name]

## 2013-02-18 MED ORDER — INSULIN GLARGINE 100 UNIT/ML ~~LOC~~ SOLN: 90.0000 [IU] | Freq: Two times a day (BID) | SUBCUTANEOUS | Status: DC

## 2013-02-18 NOTE — Telephone Encounter (Signed)
Called pt. She reports, that the lantus vial was sent to the pharmacy, but no needles.  She request the lantus pen, because it is covered under medicaid. She takes 90 units bid. Has not been taking any yesterday.  Please send lantus pen to her pharmacy. Thanks .Mauricia Area

## 2013-02-18 NOTE — Telephone Encounter (Signed)
Solostar pens sent in.

## 2013-02-28 ENCOUNTER — Encounter: Payer: Self-pay | Admitting: *Deleted

## 2013-03-09 ENCOUNTER — Ambulatory Visit (HOSPITAL_BASED_OUTPATIENT_CLINIC_OR_DEPARTMENT_OTHER): Payer: Medicaid Other

## 2013-03-16 ENCOUNTER — Telehealth: Payer: Self-pay | Admitting: Family Medicine

## 2013-03-16 MED ORDER — GLUCOSE BLOOD VI STRP: ORAL_STRIP | Status: DC

## 2013-03-16 NOTE — Telephone Encounter (Signed)
Forward to PCP for refill.[name], [name]

## 2013-03-16 NOTE — Telephone Encounter (Signed)
Completed.

## 2013-03-16 NOTE — Telephone Encounter (Signed)
Patient needs test strips for Agamitrix machine sent to [facility] on [address].

## 2013-03-21 ENCOUNTER — Ambulatory Visit: Payer: Medicaid Other | Admitting: Family Medicine

## 2013-03-23 ENCOUNTER — Ambulatory Visit
Admission: RE | Admit: 2013-03-23 | Discharge: 2013-03-23 | Disposition: A | Discharge status: Home / Self Care | Payer: Medicaid Other | Source: Ambulatory Visit | Attending: Family Medicine | Admitting: Family Medicine

## 2013-03-23 DIAGNOSIS — Z1231 Encounter for screening mammogram for malignant neoplasm of breast: Secondary | ICD-10-CM

## 2013-03-28 ENCOUNTER — Ambulatory Visit: Payer: Medicaid Other | Admitting: Family Medicine

## 2013-04-05 ENCOUNTER — Ambulatory Visit: Payer: Medicaid Other | Admitting: Family Medicine

## 2013-05-04 ENCOUNTER — Ambulatory Visit (INDEPENDENT_AMBULATORY_CARE_PROVIDER_SITE_OTHER): Payer: Medicaid Other | Admitting: Family Medicine

## 2013-05-04 ENCOUNTER — Encounter: Payer: Self-pay | Admitting: Family Medicine

## 2013-05-04 VITALS — BP 121/83 | HR 90 | Ht 62.5 in | Wt 244.0 lb

## 2013-05-04 DIAGNOSIS — R51 Headache: Secondary | ICD-10-CM

## 2013-05-04 DIAGNOSIS — I10 Essential (primary) hypertension: Secondary | ICD-10-CM

## 2013-05-04 DIAGNOSIS — E1165 Type 2 diabetes mellitus with hyperglycemia: Secondary | ICD-10-CM

## 2013-05-04 DIAGNOSIS — E669 Obesity, unspecified: Secondary | ICD-10-CM

## 2013-05-04 DIAGNOSIS — Z716 Tobacco abuse counseling: Secondary | ICD-10-CM

## 2013-05-04 DIAGNOSIS — Z7189 Other specified counseling: Secondary | ICD-10-CM

## 2013-05-04 DIAGNOSIS — IMO0001 Reserved for inherently not codable concepts without codable children: Secondary | ICD-10-CM

## 2013-05-04 DIAGNOSIS — F17200 Nicotine dependence, unspecified, uncomplicated: Secondary | ICD-10-CM

## 2013-05-04 LAB — POCT GLYCOSYLATED HEMOGLOBIN (HGB A1C): Hemoglobin A1C: 7.4

## 2013-05-04 MED ORDER — INSULIN ASPART 100 UNIT/ML ~~LOC~~ SOLN: 10.0000 [IU] | Freq: Three times a day (TID) | SUBCUTANEOUS | Status: DC

## 2013-05-04 MED ORDER — PRAVASTATIN SODIUM 80 MG PO TABS: 80.0000 mg | ORAL_TABLET | Freq: Every day | ORAL | Status: DC

## 2013-05-04 MED ORDER — INSULIN GLARGINE 100 UNIT/ML ~~LOC~~ SOLN: 100.0000 [IU] | Freq: Two times a day (BID) | SUBCUTANEOUS | Status: DC

## 2013-05-04 NOTE — Assessment & Plan Note (Signed)
Well controlled continue current medications  

## 2013-05-04 NOTE — Assessment & Plan Note (Signed)
Instructed to contact [facility] neuro dept to schedule further follow up since they have initiated work up.

## 2013-05-04 NOTE — Assessment & Plan Note (Signed)
A1c indicates much better control than the numbers she is reporting.  WIll increase novolog slightly and increase lantus.  Follow up in 2-3 weeks to be sure sugars are still doing ok.

## 2013-05-04 NOTE — Assessment & Plan Note (Signed)
Weight up slightly, encouraged to continue dietary changes and increase activity.

## 2013-05-04 NOTE — Patient Instructions (Addendum)
  General Instructions: Thank you for coming in today, it was good to see you Increase your novolog to 10 units with each meal Increase lantus to 100 units twice per day Follow up with me in 2-3 weeks Call [facility] to follow up with Neurology.    Treatment Goals:  Goals (1 Years of Data) as of 05/04/13         As of Today 02/16/13 01/17/13 12/09/12 12/09/12     Blood Pressure    . Blood Pressure < 130/80  121/83 139/81 148/80 135/78 137/70     Lifestyle    . Quit smoking / using tobacco           Result Component    . HEMOGLOBIN A1C < 8.0  7.4   12.5       Progress Toward Treatment Goals:  Treatment Goal 05/04/2013  Hemoglobin A1C improved  Blood pressure -  Stop smoking smoking less    Self Care Goals & Plans:  Self Care Goal 05/04/2013  Manage my medications take my medicines as prescribed; refill my medications on time  Monitor my health keep track of my blood glucose  Eat healthy foods drink diet soda or water instead of juice or soda; eat baked foods instead of fried foods; eat fruit for snacks and desserts  Be physically active take a walk every day  Stop smoking go to the [place] website (https://scott-booker.info/)    Home Blood Glucose Monitoring 05/04/2013  Check my blood sugar 4 times a day  When to check my blood sugar -     Care Management & Community Referrals:  Referral 02/16/2013  Referrals made for care management support nutritionist; health educator; pharmacy clinic  Referrals made to community resources smoking cessation; nutrition

## 2013-05-04 NOTE — Assessment & Plan Note (Signed)
Encouraged to quit. 

## 2013-05-04 NOTE — Progress Notes (Signed)
  Subjective:    Patient ID: [name], female    DOB: 06-10-1956, 57 y.o.   MRN: [id number]  Diabetes    1. DM: CHRONIC DIABETES  Disease Monitoring  Blood Sugar Ranges: 200-230 fasting and after meals recently.   Polyuria: no   Visual problems: Yes  Medication Compliance: yes  Medication Side Effects  Hypoglycemia: no   Preventitive Health Care  Eye Exam: 03/04/13-Dr. Groat: No retinopathy  Foot Exam: TOday  Diet pattern: Improved significantly.  Has cut out all sweetened beverages.  Smaller portions now.  Exercise: Walking occasionally.      2. HTN:   BP has been well controlled.  She does not self monitor at home.  She reports compliance with medications.   3. Obesity:   As above has modified her diet but still not exercising very much.  4. Chronic headache:  Was being worked up at [facility] for possible normal pressure hydrocephalus.  Went for LP and they were unable to get, however has not heard back from them regarding next steps. headaches are still intermittent.    5. Tobacco abuse:  Continues to smoke.  Down to 4 cigarettes per day.  She is unsure if she will be able to completely quit.    Past Medical History  Diagnosis Date  . HTN (hypertension)     takes Amlodipine daily  . Hyperlipidemia     takes Pravastatin daily  . Dizziness     related to CSF leakage  . Hx of gout   . Diarrhea   . Urinary frequency   . Urinary urgency   . Anxiety     doesn't take any meds for this  . Kidney stones   . Complication of anesthesia 01/2012    reports she extubated herself  . Asthma   . COPD (chronic obstructive pulmonary disease)   . History of chronic bronchitis 07/26/2012    "just about q winter"  . Shortness of breath     "lying down; w/exertion; during heat"  . OSA (obstructive sleep apnea) 07/26/2012    denies CPAP  . Type II diabetes mellitus   . Headache(784.0)     related to CSF leakage  . Stroke 03/2004    denies residual 07/26/2012  . Arthritis      knuckles  . Claustrophobia 07/26/2012    Review of Systems Per HPI    Objective:   Physical Exam  Constitutional:  Obese female, nad   HENT:  Head: Normocephalic.  Cardiovascular: Normal rate and regular rhythm.   Pulmonary/Chest: Effort normal and breath sounds normal.  Musculoskeletal: She exhibits edema (trace).  Neurological: She is alert.          Assessment & Plan:

## 2013-05-18 ENCOUNTER — Ambulatory Visit: Payer: Medicaid Other | Admitting: Family Medicine

## 2013-05-20 ENCOUNTER — Encounter: Payer: Self-pay | Admitting: Family Medicine

## 2013-05-20 ENCOUNTER — Ambulatory Visit (INDEPENDENT_AMBULATORY_CARE_PROVIDER_SITE_OTHER): Payer: Medicaid Other | Admitting: Family Medicine

## 2013-05-20 VITALS — BP 149/82 | HR 89 | Temp 98.1°F | Ht 62.0 in | Wt 249.1 lb

## 2013-05-20 DIAGNOSIS — E119 Type 2 diabetes mellitus without complications: Secondary | ICD-10-CM

## 2013-05-20 DIAGNOSIS — E1165 Type 2 diabetes mellitus with hyperglycemia: Secondary | ICD-10-CM

## 2013-05-20 DIAGNOSIS — IMO0001 Reserved for inherently not codable concepts without codable children: Secondary | ICD-10-CM

## 2013-05-20 MED ORDER — GABAPENTIN 300 MG PO CAPS: ORAL_CAPSULE | ORAL | Status: DC

## 2013-05-20 MED ORDER — INSULIN ASPART 100 UNIT/ML ~~LOC~~ SOLN: 15.0000 [IU] | Freq: Three times a day (TID) | SUBCUTANEOUS | Status: DC

## 2013-05-20 NOTE — Patient Instructions (Addendum)
Thank you for coming in today, it was good to see you Thank you for the card it was very kind Increase your novolog to 15 units with each meal Use the flexeril to help with back spasm/pain, you can also use tylenol.  Try to remain active. Follow up with Neurology at [place] It has been a pleasure taking care of you over the past couple of years.

## 2013-05-22 NOTE — Progress Notes (Signed)
  Subjective:    Patient ID: [name], female    DOB: 05-07-1956, 57 y.o.   MRN: [id number]  HPI  1. CHRONIC DIABETES  Disease Monitoring  Blood Sugar Ranges: 170-250, typically >200 after meals  Polyuria: no   Visual problems: no   Medication Compliance: yes  Medication Side Effects  Hypoglycemia: no   Preventitive Health Care  Eye Exam: Up to date, see health maintenance  Foot Exam: Up to date, see health maintenanc  Diet pattern: Improving, has cut out fried foods and sweetened beverages  Exercise: Minimal, she continues to have low back pain.  She is not using anything for pain but has flexeril at home which has been helpful in the past.     Review of Systems Per HPI    Objective:   Physical Exam  Constitutional:  Obese, nad   Cardiovascular: Normal rate and regular rhythm.   Pulmonary/Chest: Effort normal and breath sounds normal.  Musculoskeletal: She exhibits no edema.  Neurological: She is alert.          Assessment & Plan:

## 2013-05-22 NOTE — Assessment & Plan Note (Signed)
Lab Results  Component Value Date   HGBA1C 7.4 05/04/2013    CBG's still high after meals, will increase novolog to 15 units with each meal.  Able to verbalize proper procedue for hypoglycemia.  Follow up in 2 weeks to review glucose.

## 2013-06-03 ENCOUNTER — Ambulatory Visit (INDEPENDENT_AMBULATORY_CARE_PROVIDER_SITE_OTHER): Payer: Medicaid Other | Admitting: Family Medicine

## 2013-06-03 ENCOUNTER — Encounter: Payer: Self-pay | Admitting: Family Medicine

## 2013-06-03 VITALS — BP 139/85 | HR 78 | Ht 62.0 in | Wt 249.0 lb

## 2013-06-03 DIAGNOSIS — R42 Dizziness and giddiness: Secondary | ICD-10-CM

## 2013-06-03 DIAGNOSIS — IMO0001 Reserved for inherently not codable concepts without codable children: Secondary | ICD-10-CM

## 2013-06-03 DIAGNOSIS — F329 Major depressive disorder, single episode, unspecified: Secondary | ICD-10-CM

## 2013-06-03 DIAGNOSIS — F32A Depression, unspecified: Secondary | ICD-10-CM

## 2013-06-03 DIAGNOSIS — I6529 Occlusion and stenosis of unspecified carotid artery: Secondary | ICD-10-CM

## 2013-06-03 DIAGNOSIS — I658 Occlusion and stenosis of other precerebral arteries: Secondary | ICD-10-CM

## 2013-06-03 DIAGNOSIS — I779 Disorder of arteries and arterioles, unspecified: Secondary | ICD-10-CM | POA: Insufficient documentation

## 2013-06-03 DIAGNOSIS — I6523 Occlusion and stenosis of bilateral carotid arteries: Secondary | ICD-10-CM

## 2013-06-03 DIAGNOSIS — E1165 Type 2 diabetes mellitus with hyperglycemia: Secondary | ICD-10-CM

## 2013-06-03 MED ORDER — PRAVASTATIN SODIUM 80 MG PO TABS: 80.0000 mg | ORAL_TABLET | Freq: Every day | ORAL | Status: DC

## 2013-06-03 NOTE — Patient Instructions (Addendum)
Thank you for coming in today, it was good to see you Continue to work on your dietary changes and quit smoking Follow up with Dr. Otis Dials in 4-6 weeks or sooner as needed

## 2013-06-05 NOTE — Assessment & Plan Note (Signed)
Followed by [employer]

## 2013-06-05 NOTE — Assessment & Plan Note (Addendum)
Last a1c shows much better control.  Will have her check post prandials with increase in novolog.  She has been fairly non-compliant with diet and exercise recently.

## 2013-06-05 NOTE — Assessment & Plan Note (Signed)
Concern for possible NPH, has upcoming f/u at Neuro at [facility]

## 2013-06-05 NOTE — Progress Notes (Signed)
  Subjective:    Patient ID: [name], female    DOB: [birth year], 57 y.o.   MRN: [id number]  HPI  Patient here for brief follow up regarding diabetes mellitus since recent increase in novolog.  She reports that her fasting glucose is around 170.  She has not been checking her post prandial glucose recently.  She does report that she is under a lot of stress currently due to her mother being diagnosed with lung cancer and her sister having to go the ER this morning.  She has reverted back to some of her poor eating habits for convenience and comfort.  She still remains non compliant with increasing activity.     Review of Systems Per HPI    Objective:   Physical Exam  Constitutional:  Morbidly obese, nad   HENT:  Mouth/Throat: Oropharynx is clear and moist.  Cardiovascular: Normal rate and regular rhythm.   Pulmonary/Chest: Effort normal and breath sounds normal. No respiratory distress.  Musculoskeletal: She exhibits no edema.  Neurological: She is alert.          Assessment & Plan:

## 2013-06-16 ENCOUNTER — Other Ambulatory Visit: Payer: Self-pay

## 2013-06-17 ENCOUNTER — Emergency Department (HOSPITAL_COMMUNITY): Payer: Medicare Other

## 2013-06-17 ENCOUNTER — Encounter (HOSPITAL_COMMUNITY): Payer: Self-pay | Admitting: Adult Health

## 2013-06-17 ENCOUNTER — Telehealth: Payer: Self-pay | Admitting: Family Medicine

## 2013-06-17 ENCOUNTER — Emergency Department (HOSPITAL_COMMUNITY)
Admission: EM | Admit: 2013-06-17 | Discharge: 2013-06-18 | Disposition: A | Discharge status: Home / Self Care | Payer: Medicare Other | Attending: Emergency Medicine | Admitting: Emergency Medicine

## 2013-06-17 DIAGNOSIS — R51 Headache: Secondary | ICD-10-CM | POA: Insufficient documentation

## 2013-06-17 DIAGNOSIS — L0201 Cutaneous abscess of face: Secondary | ICD-10-CM | POA: Insufficient documentation

## 2013-06-17 DIAGNOSIS — E1159 Type 2 diabetes mellitus with other circulatory complications: Secondary | ICD-10-CM | POA: Insufficient documentation

## 2013-06-17 DIAGNOSIS — Z8639 Personal history of other endocrine, nutritional and metabolic disease: Secondary | ICD-10-CM | POA: Insufficient documentation

## 2013-06-17 DIAGNOSIS — Z8673 Personal history of transient ischemic attack (TIA), and cerebral infarction without residual deficits: Secondary | ICD-10-CM | POA: Insufficient documentation

## 2013-06-17 DIAGNOSIS — F17200 Nicotine dependence, unspecified, uncomplicated: Secondary | ICD-10-CM | POA: Insufficient documentation

## 2013-06-17 DIAGNOSIS — Z79899 Other long term (current) drug therapy: Secondary | ICD-10-CM | POA: Insufficient documentation

## 2013-06-17 DIAGNOSIS — F411 Generalized anxiety disorder: Secondary | ICD-10-CM | POA: Insufficient documentation

## 2013-06-17 DIAGNOSIS — I10 Essential (primary) hypertension: Secondary | ICD-10-CM | POA: Insufficient documentation

## 2013-06-17 DIAGNOSIS — J45909 Unspecified asthma, uncomplicated: Secondary | ICD-10-CM | POA: Insufficient documentation

## 2013-06-17 DIAGNOSIS — Z794 Long term (current) use of insulin: Secondary | ICD-10-CM | POA: Insufficient documentation

## 2013-06-17 DIAGNOSIS — Z862 Personal history of diseases of the blood and blood-forming organs and certain disorders involving the immune mechanism: Secondary | ICD-10-CM | POA: Insufficient documentation

## 2013-06-17 DIAGNOSIS — F40298 Other specified phobia: Secondary | ICD-10-CM | POA: Insufficient documentation

## 2013-06-17 DIAGNOSIS — K044 Acute apical periodontitis of pulpal origin: Secondary | ICD-10-CM | POA: Insufficient documentation

## 2013-06-17 DIAGNOSIS — K029 Dental caries, unspecified: Secondary | ICD-10-CM | POA: Insufficient documentation

## 2013-06-17 DIAGNOSIS — M129 Arthropathy, unspecified: Secondary | ICD-10-CM | POA: Insufficient documentation

## 2013-06-17 DIAGNOSIS — J4489 Other specified chronic obstructive pulmonary disease: Secondary | ICD-10-CM | POA: Insufficient documentation

## 2013-06-17 DIAGNOSIS — Z8709 Personal history of other diseases of the respiratory system: Secondary | ICD-10-CM | POA: Insufficient documentation

## 2013-06-17 DIAGNOSIS — Z87442 Personal history of urinary calculi: Secondary | ICD-10-CM | POA: Insufficient documentation

## 2013-06-17 DIAGNOSIS — Z885 Allergy status to narcotic agent status: Secondary | ICD-10-CM | POA: Insufficient documentation

## 2013-06-17 DIAGNOSIS — M542 Cervicalgia: Secondary | ICD-10-CM | POA: Insufficient documentation

## 2013-06-17 DIAGNOSIS — J449 Chronic obstructive pulmonary disease, unspecified: Secondary | ICD-10-CM | POA: Insufficient documentation

## 2013-06-17 DIAGNOSIS — L03211 Cellulitis of face: Secondary | ICD-10-CM | POA: Insufficient documentation

## 2013-06-17 DIAGNOSIS — J320 Chronic maxillary sinusitis: Secondary | ICD-10-CM | POA: Insufficient documentation

## 2013-06-17 DIAGNOSIS — R519 Headache, unspecified: Secondary | ICD-10-CM

## 2013-06-17 DIAGNOSIS — G4733 Obstructive sleep apnea (adult) (pediatric): Secondary | ICD-10-CM | POA: Insufficient documentation

## 2013-06-17 DIAGNOSIS — E785 Hyperlipidemia, unspecified: Secondary | ICD-10-CM | POA: Insufficient documentation

## 2013-06-17 DIAGNOSIS — Z888 Allergy status to other drugs, medicaments and biological substances status: Secondary | ICD-10-CM | POA: Insufficient documentation

## 2013-06-17 DIAGNOSIS — R209 Unspecified disturbances of skin sensation: Secondary | ICD-10-CM | POA: Insufficient documentation

## 2013-06-17 LAB — CBC WITH DIFFERENTIAL/PLATELET
Basophils Absolute: 0 10*3/uL (ref 0.0–0.1)
Basophils Relative: 0 % (ref 0–1)
Eosinophils Absolute: 0.3 10*3/uL (ref 0.0–0.7)
Eosinophils Relative: 3 % (ref 0–5)
HCT: 46.7 % — ABNORMAL HIGH (ref 36.0–46.0)
Hemoglobin: 15.2 g/dL — ABNORMAL HIGH (ref 12.0–15.0)
Lymphocytes Relative: 20 % (ref 12–46)
Lymphs Abs: 2.1 10*3/uL (ref 0.7–4.0)
MCH: 30 pg (ref 26.0–34.0)
MCHC: 32.5 g/dL (ref 30.0–36.0)
MCV: 92.3 fL (ref 78.0–100.0)
Monocytes Absolute: 0.6 10*3/uL (ref 0.1–1.0)
Monocytes Relative: 6 % (ref 3–12)
Neutro Abs: 7.4 10*3/uL (ref 1.7–7.7)
Neutrophils Relative %: 71 % (ref 43–77)
Platelets: 214 10*3/uL (ref 150–400)
RBC: 5.06 MIL/uL (ref 3.87–5.11)
RDW: 16.2 % — ABNORMAL HIGH (ref 11.5–15.5)
WBC: 10.5 10*3/uL (ref 4.0–10.5)

## 2013-06-17 LAB — POCT I-STAT, CHEM 8
BUN: 22 mg/dL (ref 6–23)
Calcium, Ion: 1.04 mmol/L — ABNORMAL LOW (ref 1.12–1.23)
Chloride: 102 mEq/L (ref 96–112)
Creatinine, Ser: 1.4 mg/dL — ABNORMAL HIGH (ref 0.50–1.10)
Glucose, Bld: 147 mg/dL — ABNORMAL HIGH (ref 70–99)
HCT: 49 % — ABNORMAL HIGH (ref 36.0–46.0)
Hemoglobin: 16.7 g/dL — ABNORMAL HIGH (ref 12.0–15.0)
Potassium: 4.4 mEq/L (ref 3.5–5.1)
Sodium: 142 mEq/L (ref 135–145)
TCO2: 28 mmol/L (ref 0–100)

## 2013-06-17 LAB — BASIC METABOLIC PANEL
BUN: 18 mg/dL (ref 6–23)
CO2: 27 mEq/L (ref 19–32)
Calcium: 8.7 mg/dL (ref 8.4–10.5)
Chloride: 101 mEq/L (ref 96–112)
Creatinine, Ser: 1.27 mg/dL — ABNORMAL HIGH (ref 0.50–1.10)
GFR calc Af Amer: 54 mL/min — ABNORMAL LOW (ref >90)
GFR calc non Af Amer: 46 mL/min — ABNORMAL LOW (ref >90)
Glucose, Bld: 147 mg/dL — ABNORMAL HIGH (ref 70–99)
Potassium: 4.1 mEq/L (ref 3.5–5.1)
Sodium: 139 mEq/L (ref 135–145)

## 2013-06-17 MED ORDER — OXYCODONE-ACETAMINOPHEN 5-325 MG PO TABS: 2.0000 | ORAL_TABLET | ORAL | Status: DC | PRN

## 2013-06-17 MED ORDER — VANCOMYCIN HCL 10 G IV SOLR
2000.0000 mg | INTRAVENOUS | Status: AC
  Administered 2013-06-17: 2000 mg via INTRAVENOUS
  Filled 2013-06-17: qty 2000

## 2013-06-17 MED ORDER — IOHEXOL 300 MG/ML  SOLN
100.0000 mL | Freq: Once | INTRAMUSCULAR | Status: AC | PRN
  Administered 2013-06-17: 100 mL via INTRAVENOUS

## 2013-06-17 MED ORDER — HYDROMORPHONE HCL PF 1 MG/ML IJ SOLN
1.0000 mg | Freq: Once | INTRAMUSCULAR | Status: AC
  Administered 2013-06-17: 1 mg via INTRAVENOUS
  Filled 2013-06-17: qty 1

## 2013-06-17 MED ORDER — SODIUM CHLORIDE 0.9 % IV SOLN
3.0000 g | Freq: Once | INTRAVENOUS | Status: AC
  Administered 2013-06-17: 3 g via INTRAVENOUS
  Filled 2013-06-17: qty 3

## 2013-06-17 MED ORDER — CLINDAMYCIN HCL 300 MG PO CAPS: 300.0000 mg | ORAL_CAPSULE | Freq: Three times a day (TID) | ORAL | Status: DC

## 2013-06-17 MED ORDER — ONDANSETRON HCL 4 MG/2ML IJ SOLN
4.0000 mg | Freq: Once | INTRAMUSCULAR | Status: AC
  Administered 2013-06-17: 4 mg via INTRAVENOUS
  Filled 2013-06-17: qty 2

## 2013-06-17 NOTE — ED Notes (Signed)
Report given to Kim RN.

## 2013-06-17 NOTE — ED Notes (Signed)
Lab called to come collect blood culture.

## 2013-06-17 NOTE — ED Notes (Signed)
No answer

## 2013-06-17 NOTE — ED Notes (Signed)
Pt presents with left sided facial swelling and numbness that began today last night and progressed into today. Pt reports seeing a dentist today and given AMoxicillin and pain medication. She states, "I went home to lay down and called the oral surgeon then I laid down and woke up and was in sever pain on the left side of her head." She reports having multiple strokes and a spinal fluid leak through her nose in FEb of 2013. Pt has no neuro deficits at this time. Facial swelling noted to left side.

## 2013-06-17 NOTE — ED Notes (Signed)
Patient to CT at this time

## 2013-06-17 NOTE — ED Provider Notes (Signed)
History    CSN: [id number] Arrival date & time 06/17/13  1634  First MD Initiated Contact with Patient 06/17/13 1640     Chief Complaint  Patient presents with  . Facial Swelling   (Consider location/radiation/quality/duration/timing/severity/associated sxs/prior Treatment) HPI Comments: Pt w multiple co morbidities significant for remote CVA w/out residual deficit and poor dentition now w/ left sided mouth pain, facial swelling and HA. States several wks of tooth pain 11 and 12, seen by dentist yesterday - xray noted left maxillary sinusitis - given augmentin. Today noted worsening HA, facial swelling, erythema, and transient LUE paresthesia. Pt has hx of CSF leak through left sphenoid - s/p repair in 2013. Admits to intermittent CSF leak - last noted 2 months ago. Denies any current CSF leak. Denies fever, neck stiffness. Does admit to mild pain along left side of neck, entire left face and left side of head.   Patient is a 57 y.o. female presenting with general illness. The history is provided by the patient. No language interpreter was used.  Illness Location:  HEENT Quality:  Facial swelling, HA, dental carries  Severity:  Moderate Onset quality:  Gradual Timing:  Constant Progression:  Worsening Chronicity:  New  Past Medical History  Diagnosis Date  . HTN (hypertension)     takes Amlodipine daily  . Hyperlipidemia     takes Pravastatin daily  . Dizziness     related to CSF leakage  . Hx of gout   . Diarrhea   . Urinary frequency   . Urinary urgency   . Anxiety     doesn't take any meds for this  . Kidney stones   . Complication of anesthesia 01/2012    reports she extubated herself  . Asthma   . COPD (chronic obstructive pulmonary disease)   . History of chronic bronchitis 07/26/2012    "just about q winter"  . Shortness of breath     "lying down; w/exertion; during heat"  . OSA (obstructive sleep apnea) 07/26/2012    denies CPAP  . Type II diabetes mellitus     . Headache(784.0)     related to CSF leakage  . Stroke 03/2004    denies residual 07/26/2012  . Arthritis     knuckles  . Claustrophobia 07/26/2012   Past Surgical History  Procedure Laterality Date  . Nm myoview ltd  08/25/2011    Low risk study, no evidence of ischemia, EF 44%  . Sphenoidectomy  01/29/2012    Procedure: SPHENOIDECTOMY;  Surgeon: Ruby Cola, MD;  Location: [place];  Service: ENT;  Laterality: Left;  REPAIR OF LEFT SPHENOID    . Nasal sinus surgery  01/29/2012    Procedure: ENDOSCOPIC SINUS SURGERY WITH STEALTH;  Surgeon: Ruby Cola, MD;  Location: [place];  Service: ENT;  Laterality: N/A;  . Total abdominal hysterectomy  1994  . Refractive surgery  ~ 2010    left   Family History  Problem Relation Age of Onset  . Colon cancer Sister 110  . Breast cancer Mother 1  . Diabetes Sister 66  . Diabetes Sister 5  . Lung cancer Father   . Anesthesia problems Neg Hx   . Hypotension Neg Hx   . Malignant hyperthermia Neg Hx   . Pseudochol deficiency Neg Hx    History  Substance Use Topics  . Smoking status: Current Every Day Smoker -- 0.25 packs/day for 35 years    Types: Cigarettes  . Smokeless tobacco: Not on file  Comment: 07/26/2012 has decreased smoking from 2ppd; offered cessation materials; pt declines  . Alcohol Use: No   OB History   Grav Para Term Preterm Abortions TAB SAB Ect Mult Living   0              Review of Systems  Allergies  Atorvastatin; Metformin and related; Vicodin; and Ace inhibitors  Home Medications   Current Outpatient Rx  Name  Route  Sig  Dispense  Refill  . amLODipine (NORVASC) 10 MG tablet   Oral   Take 10 mg by mouth daily.         Marland Kitchen gabapentin (NEURONTIN) 300 MG capsule      300mg  in am and at dinner, 600mg  before bed.   120 capsule   3   . glucose blood (AGAMATRIX PRESTO TEST) test strip      Use as instructed   100 each   12   . insulin aspart (NOVOLOG) 100 UNIT/ML injection   Subcutaneous   Inject  15 Units into the skin 3 (three) times daily before meals. Please dispense pens   15 mL   12   . insulin glargine (LANTUS) 100 UNIT/ML injection   Subcutaneous   Inject 1 mL (100 Units total) into the skin 2 (two) times daily.   25 pen   12   . pravastatin (PRAVACHOL) 80 MG tablet   Oral   Take 1 tablet (80 mg total) by mouth daily.   90 tablet   1   . topiramate (TOPAMAX) 25 MG tablet   Oral   Take 25 mg by mouth 2 (two) times daily.         . traZODone (DESYREL) 100 MG tablet   Oral   Take 100 mg by mouth at bedtime.         . valsartan (DIOVAN) 80 MG tablet   Oral   Take 80 mg by mouth daily.          BP 154/69  Pulse 72  Temp(Src) 97.7 F (36.5 C) (Oral)  Resp 16  Ht 5\' 2"  (1.575 m)  Wt 239 lb (108.41 kg)  BMI 43.7 kg/m2  SpO2 93% Physical Exam  Vitals reviewed. Constitutional: She is oriented to person, place, and time. She appears well-developed and well-nourished.  Non-toxic appearance. She does not have a sickly appearance. She does not appear ill. No distress.  HENT:  Head: Normocephalic and atraumatic. No trismus in the jaw.    Nose: No mucosal edema or rhinorrhea. No epistaxis.  Mouth/Throat: Oropharynx is clear and moist and mucous membranes are normal. No oral lesions. Abnormal dentition. Dental caries present. No dental abscesses or edematous.    Eyes: EOM are normal. Pupils are equal, round, and reactive to light.  Neck: Normal range of motion. Neck supple. No rigidity. Normal range of motion present. No Brudzinski's sign noted.  Cardiovascular: Normal rate and regular rhythm.   Pulmonary/Chest: Effort normal. No respiratory distress.  Abdominal: Soft. She exhibits no distension.  Musculoskeletal: Normal range of motion. She exhibits no edema.  Neurological: She is alert and oriented to person, place, and time. She has normal strength. No cranial nerve deficit or sensory deficit. Coordination normal. GCS eye subscore is 4. GCS verbal  subscore is 5. GCS motor subscore is 6.  Skin: Skin is warm and dry.  Psychiatric: She has a normal mood and affect. Her behavior is normal.    ED Course  Procedures (including critical care time) Results for orders  placed during the hospital encounter of 06/17/13  CBC WITH DIFFERENTIAL      Result Value Range   WBC 10.5  4.0 - 10.5 K/uL   RBC 5.06  3.87 - 5.11 MIL/uL   Hemoglobin 15.2 (*) 12.0 - 15.0 g/dL   HCT 46.7 (*) 36.0 - 46.0 %   MCV 92.3  78.0 - 100.0 fL   MCH 30.0  26.0 - 34.0 pg   MCHC 32.5  30.0 - 36.0 g/dL   RDW 16.2 (*) 11.5 - 15.5 %   Platelets 214  150 - 400 K/uL   Neutrophils Relative % 71  43 - 77 %   Neutro Abs 7.4  1.7 - 7.7 K/uL   Lymphocytes Relative 20  12 - 46 %   Lymphs Abs 2.1  0.7 - 4.0 K/uL   Monocytes Relative 6  3 - 12 %   Monocytes Absolute 0.6  0.1 - 1.0 K/uL   Eosinophils Relative 3  0 - 5 %   Eosinophils Absolute 0.3  0.0 - 0.7 K/uL   Basophils Relative 0  0 - 1 %   Basophils Absolute 0.0  0.0 - 0.1 K/uL  BASIC METABOLIC PANEL      Result Value Range   Sodium 139  135 - 145 mEq/L   Potassium 4.1  3.5 - 5.1 mEq/L   Chloride 101  96 - 112 mEq/L   CO2 27  19 - 32 mEq/L   Glucose, Bld 147 (*) 70 - 99 mg/dL   BUN 18  6 - 23 mg/dL   Creatinine, Ser 1.27 (*) 0.50 - 1.10 mg/dL   Calcium 8.7  8.4 - 10.5 mg/dL   GFR calc non Af Amer 46 (*) >90 mL/min   GFR calc Af Amer 54 (*) >90 mL/min  POCT I-STAT, CHEM 8      Result Value Range   Sodium 142  135 - 145 mEq/L   Potassium 4.4  3.5 - 5.1 mEq/L   Chloride 102  96 - 112 mEq/L   BUN 22  6 - 23 mg/dL   Creatinine, Ser 1.40 (*) 0.50 - 1.10 mg/dL   Glucose, Bld 147 (*) 70 - 99 mg/dL   Calcium, Ion 1.04 (*) 1.12 - 1.23 mmol/L   TCO2 28  0 - 100 mmol/L   Hemoglobin 16.7 (*) 12.0 - 15.0 g/dL   HCT 49.0 (*) 36.0 - 46.0 %    CT Head Wo Contrast (Final result)  Result time: 06/17/13 20:59:01    Final result by Rad Results In Interface (06/17/13 20:59:01)    Narrative:   *RADIOLOGY  REPORT*  Clinical Data: Facial swelling.  CT HEAD WITHOUT CONTRAST  Technique: Contiguous axial images were obtained from the base of the skull through the vertex without intravenous contrast.  Comparison: 06/27/2012  Findings: Ventricle size is normal. Small chronic lacuna in the left thalamus. Mild chronic microvascular ischemic change in the white matter. No acute infarct. Negative for hemorrhage or mass lesion.  Opacification of the left maxillary sinus. Prior medial antrectomy and ethmoidectomy on the left. Air-fluid level left sphenoid sinus.  IMPRESSION: No acute intracranial abnormality.  Sinusitis on the left.  *RADIOLOGY REPORT*  Clinical Data: Left facial swelling. Rule out infection  CT MAXILLOFACIAL WITH CONTRAST  Technique: Multidetector CT imaging of the maxillofacial structure s was performed with intravenous contrast. Multiplanar CT image reconstructions were als o generated. A small metallic BB was placed on the right temple in order to reliably differentiate right from  left.  Contrast: 165mL OMNIPAQUE IOHEXOL 300 MG/ML SOLN  Comparison: CT face 11/17/2011  Findings: There is mild edema in the subcutaneous fat of the left maxilla. This may be due to cellulitis. No abscess is seen. The patient does have dental disease with multiple caries present. However, no periapical dental abscess is identified in the area of cellulitis.  There is chronic sinusitis involving the left maxillary and ethmoid sinuses. Prior medial antrectomy left maxillary sinus. Air-fluid level in the left sphenoid sinus.  The orbit is intact. Parotid and submandibular glands are intact. Shotty adenopathy in the neck. Thyroid is normal. Lung apices are clear.  IMPRESSION:  Cellulitis left maxilla and lateral. This could be from dental infection however no definite dental abscess is seen the area. There are multiple areas of dental infection and caries.  Chronic sinusitis  on the left. This is probably unrelated to the cellulitis as there is no bony defect connecting the sinus cavity with the subcutaneous tissues.   Original Report Authenticated By: Carl Best, M.D.             CT Soft Tissue Neck W Contrast (Final result)  Result time: 06/17/13 20:59:00    Final result by Rad Results In Interface (06/17/13 20:59:00)    Narrative:   *RADIOLOGY REPORT*  Clinical Data: Facial swelling.  CT HEAD WITHOUT CONTRAST  Technique: Contiguous axial images were obtained from the base of the skull through the vertex without intravenous contrast.  Comparison: 06/27/2012  Findings: Ventricle size is normal. Small chronic lacuna in the left thalamus. Mild chronic microvascular ischemic change in the white matter. No acute infarct. Negative for hemorrhage or mass lesion.  Opacification of the left maxillary sinus. Prior medial antrectomy and ethmoidectomy on the left. Air-fluid level left sphenoid sinus.  IMPRESSION: No acute intracranial abnormality.  Sinusitis on the left.  *RADIOLOGY REPORT*  Clinical Data: Left facial swelling. Rule out infection  CT MAXILLOFACIAL WITH CONTRAST  Technique: Multidetector CT imaging of the maxillofacial structure s was performed with intravenous contrast. Multiplanar CT image reconstructions were als o generated. A small metallic BB was placed on the right temple in order to reliably differentiate right from left.  Contrast: 171mL OMNIPAQUE IOHEXOL 300 MG/ML SOLN  Comparison: CT face 11/17/2011  Findings: There is mild edema in the subcutaneous fat of the left maxilla. This may be due to cellulitis. No abscess is seen. The patient does have dental disease with multiple caries present. However, no periapical dental abscess is identified in the area of cellulitis.  There is chronic sinusitis involving the left maxillary and ethmoid sinuses. Prior medial antrectomy left maxillary sinus. Air-fluid level  in the left sphenoid sinus.  The orbit is intact. Parotid and submandibular glands are intact. Shotty adenopathy in the neck. Thyroid is normal. Lung apices are clear.  IMPRESSION:  Cellulitis left maxilla and lateral. This could be from dental infection however no definite dental abscess is seen the area. There are multiple areas of dental infection and caries.  Chronic sinusitis on the left. This is probably unrelated to the cellulitis as there is no bony defect connecting the sinus cavity with the subcutaneous tissues.   Original Report Authenticated By: Carl Best, M.D.         No results found. No diagnosis found.  MDM  Exam as above - significant for mild left facial erythema and swelling, no pain w/ EOM - doubt orbital cellulitis. Dental carries w/out definitive abscess. ttp left maxillary sinus. No focal weakness in  extremities. CT head, neck and face obtained - significant for likely chronic left sinusitis, no dental abscess. Cellulitis noted overlying left maxilla. No identifiable tract from tooth to sinus or intracranial. Doubt CNS spread through previously repaired sinus fistula. No deep tissue infection in neck.  No CSF leak on exam. No meningismus. Doubt meningitis. Labs unremarkable. No leukocytosis. Pain controlled w/ dilaudid. Given vanc/unasyn. Doubt CVA/TIA based on hx and exam.   Course: reassessed - vitals stable, pain well controlled NAD. Deemed stable for d/c home. Pt has close follow up w/ pcp and dentist on Monday. Given strict return precautions for s/s of meningitis, orbital cellulitis and worsening cellulitis. D/c w/ clindamycin and percocet. D/c in good and stable condition.   1. Facial cellulitis   2. Infected dental carries   3. Facial pain   4. Headache    New Prescriptions   CLINDAMYCIN (CLEOCIN) 300 MG CAPSULE    Take 1 capsule (300 mg total) by mouth 3 (three) times daily. X 7 days   OXYCODONE-ACETAMINOPHEN (PERCOCET) 5-325 MG PER TABLET     Take 2 tablets by mouth every 4 (four) hours as needed for pain.   Kandis Nab, MD [address] [place] [address] [phone]  Schedule an appointment as soon as possible for a visit on 06/20/2013   [place] [id number] [place] [address] [phone]  return to ED at onset of high fever, neck stiffness, unilateral weakness, severe pain with eye movement     Ernst Spell, MD 06/18/13 0009

## 2013-06-17 NOTE — ED Notes (Signed)
Pt came to the ED with facial swelling. Currently has facial swelling. No respiratory distress. Pt getting Vancomycin IV.  She is alert and oriented. No cardiac distress. Will continue to monitor.

## 2013-06-17 NOTE — ED Notes (Signed)
Pt reports taken 800 mg of Ibprofen for pain at 1600.

## 2013-06-18 NOTE — ED Provider Notes (Signed)
Medical screening examination/treatment/procedure(s) were performed by non-physician practitioner and as supervising physician I was immediately available for consultation/collaboration.  Hoy Morn, MD 06/18/13 [phone]

## 2013-06-22 NOTE — Telephone Encounter (Signed)
error 

## 2013-06-24 LAB — CULTURE, BLOOD (ROUTINE X 2)
Culture: NO GROWTH
Culture: NO GROWTH

## 2013-07-01 ENCOUNTER — Encounter: Payer: Self-pay | Admitting: Family Medicine

## 2013-07-01 ENCOUNTER — Ambulatory Visit (INDEPENDENT_AMBULATORY_CARE_PROVIDER_SITE_OTHER): Payer: Medicare Other | Admitting: Family Medicine

## 2013-07-01 VITALS — BP 132/80 | HR 72 | Ht 62.0 in | Wt 251.0 lb

## 2013-07-01 DIAGNOSIS — E1165 Type 2 diabetes mellitus with hyperglycemia: Secondary | ICD-10-CM

## 2013-07-01 DIAGNOSIS — R51 Headache: Secondary | ICD-10-CM

## 2013-07-01 DIAGNOSIS — IMO0001 Reserved for inherently not codable concepts without codable children: Secondary | ICD-10-CM

## 2013-07-01 DIAGNOSIS — I10 Essential (primary) hypertension: Secondary | ICD-10-CM

## 2013-07-01 DIAGNOSIS — I6529 Occlusion and stenosis of unspecified carotid artery: Secondary | ICD-10-CM

## 2013-07-01 DIAGNOSIS — E119 Type 2 diabetes mellitus without complications: Secondary | ICD-10-CM

## 2013-07-01 MED ORDER — VALSARTAN 80 MG PO TABS: 80.0000 mg | ORAL_TABLET | Freq: Every day | ORAL | Status: DC

## 2013-07-01 MED ORDER — GLUCOSE BLOOD VI STRP: ORAL_STRIP | Status: DC

## 2013-07-01 MED ORDER — INSULIN GLARGINE 100 UNIT/ML ~~LOC~~ SOLN: 90.0000 [IU] | Freq: Every morning | SUBCUTANEOUS | Status: DC

## 2013-07-01 NOTE — Patient Instructions (Signed)
Take 10 units of novolog with meals. If your sugar drops lower than 100 before eating, reduce the dose to 8 units. Continue with lantus 100 units.

## 2013-07-02 NOTE — Progress Notes (Signed)
Patient ID: SHALIYAH JEANLOUIS    DOB: 01-Dec-1956, 57 y.o.   MRN: [id number] --- Subjective:  Anelie is a 57 y.o.female who presents to meet new doctor. Concerns include the following: - headache: she has a history of CSF leak from the nose but recently has not had much leaking. She complains of pressure like headache located along left temple, radiating to the neck. Has been present for a couple of months. Similar to pain she has had with CSF leak in the past. It's intermittent, throbbing, no change in vision, no associated nausea or vomiting. No photo or phono phobia. No fevers. No chills. She was in the ED on 06/17/13 and was diagnosed with sinusitis based on head CT and prescribed augmentin which she took. Pain has not improved. She denies any nasal congestion or rhinorrhea. She takes tylenol occasionally without significant help. She has appointment with [facility] on October 9th.   - follow up from ED for face cellulitis: was prescribed clindamycin which she finished. Rash cleared and resolved.  - diabetes:  Takes lantus 100units daily and novolog 8 units with meals. CBG's 230's before eating. No hypoglycemia. No new change in vision.   ROS: see HPI Past Medical History: reviewed and updated medications and allergies. Social History: Tobacco: 1/4 pack per day  Objective: Filed Vitals:   07/01/13 0902  BP: 132/80  Pulse: 72    Physical Examination:   General appearance - alert, well appearing, and in no distress Ears - bilateral TM's and external ear canals normal  Nose - normal and patent, no erythema, discharge or polyps Mouth - mucous membranes moist, pharynx normal without lesions Sinuses - tenderness along left maxillary and frontal sinuses.  Neuro - CN2-12 grossly intact, 5/5 grip strength bilaterally Chest - clear to auscultation, no wheezes, rales or rhonchi, symmetric air entry Heart - normal rate, regular rhythm, normal S1, S2, no murmurs, rubs, clicks or  gallops

## 2013-07-03 NOTE — Assessment & Plan Note (Signed)
Ordered carotid duplex since it has been one year since last evaluation.

## 2013-07-03 NOTE — Assessment & Plan Note (Signed)
Patient has follow up with [facility] in October. Head CT from 06/17/13 showed chronic left sinusitis for which she was prescribed antibiotics which did not seem to significantly help. Headache could be from sinusitis vs h/o CSF leak.  Pain management with tylenol (instructed patient that she could take up to 3gm per day). No NSAID given underlying renal insufficiency.  Consider nasal steroid for chronic sinusitis maintenance therapy.

## 2013-07-03 NOTE — Assessment & Plan Note (Addendum)
Not due for A1C yet. CBG's not controled. Increase novolog to 10units with meals from 8units. Follow up in 1 month.

## 2013-07-04 ENCOUNTER — Ambulatory Visit (HOSPITAL_COMMUNITY): Payer: Medicare Other

## 2013-07-06 ENCOUNTER — Other Ambulatory Visit (HOSPITAL_COMMUNITY): Payer: Self-pay | Admitting: Family Medicine

## 2013-07-06 ENCOUNTER — Ambulatory Visit (HOSPITAL_COMMUNITY)
Admission: RE | Admit: 2013-07-06 | Discharge: 2013-07-06 | Disposition: A | Discharge status: Home / Self Care | Payer: Medicare Other | Source: Ambulatory Visit | Attending: Family Medicine | Admitting: Family Medicine

## 2013-07-06 DIAGNOSIS — I6523 Occlusion and stenosis of bilateral carotid arteries: Secondary | ICD-10-CM

## 2013-07-06 DIAGNOSIS — R0989 Other specified symptoms and signs involving the circulatory and respiratory systems: Secondary | ICD-10-CM

## 2013-07-06 DIAGNOSIS — I6529 Occlusion and stenosis of unspecified carotid artery: Secondary | ICD-10-CM | POA: Insufficient documentation

## 2013-07-06 DIAGNOSIS — I658 Occlusion and stenosis of other precerebral arteries: Secondary | ICD-10-CM | POA: Insufficient documentation

## 2013-07-06 DIAGNOSIS — E119 Type 2 diabetes mellitus without complications: Secondary | ICD-10-CM | POA: Insufficient documentation

## 2013-07-06 DIAGNOSIS — I10 Essential (primary) hypertension: Secondary | ICD-10-CM | POA: Insufficient documentation

## 2013-07-06 NOTE — Progress Notes (Signed)
*  PRELIMINARY RESULTS* Vascular Ultrasound Carotid Duplex (Doppler) has been completed.  Preliminary findings: Bilateral 40-59% ICA stenosis, high end of scale. Velocities appear unchanged from previous study 07/16/12.  Landry Mellow, RDMS, RVT  07/06/2013, 11:41 AM

## 2013-07-07 ENCOUNTER — Inpatient Hospital Stay (HOSPITAL_COMMUNITY)
Admission: AD | Admit: 2013-07-07 | Discharge: 2013-07-07 | Disposition: A | Discharge status: Home / Self Care | Payer: Medicare Other | Source: Ambulatory Visit | Attending: Obstetrics & Gynecology | Admitting: Obstetrics & Gynecology

## 2013-07-07 ENCOUNTER — Other Ambulatory Visit: Payer: Self-pay | Admitting: Advanced Practice Midwife

## 2013-07-07 ENCOUNTER — Encounter (HOSPITAL_COMMUNITY): Payer: Self-pay | Admitting: *Deleted

## 2013-07-07 DIAGNOSIS — R109 Unspecified abdominal pain: Secondary | ICD-10-CM | POA: Insufficient documentation

## 2013-07-07 DIAGNOSIS — B3731 Acute candidiasis of vulva and vagina: Secondary | ICD-10-CM | POA: Insufficient documentation

## 2013-07-07 DIAGNOSIS — N949 Unspecified condition associated with female genital organs and menstrual cycle: Secondary | ICD-10-CM | POA: Insufficient documentation

## 2013-07-07 DIAGNOSIS — B373 Candidiasis of vulva and vagina: Secondary | ICD-10-CM

## 2013-07-07 DIAGNOSIS — N938 Other specified abnormal uterine and vaginal bleeding: Secondary | ICD-10-CM

## 2013-07-07 LAB — WET PREP, GENITAL
Clue Cells Wet Prep HPF POC: NONE SEEN
Trich, Wet Prep: NONE SEEN

## 2013-07-07 LAB — URINALYSIS, ROUTINE W REFLEX MICROSCOPIC
Bilirubin Urine: NEGATIVE
Glucose, UA: 250 mg/dL — AB
Ketones, ur: NEGATIVE mg/dL
Nitrite: NEGATIVE
Protein, ur: 30 mg/dL — AB
Specific Gravity, Urine: >1.03 — ABNORMAL HIGH (ref 1.005–1.030)
Urobilinogen, UA: 0.2 mg/dL (ref 0.0–1.0)
pH: 5.5 (ref 5.0–8.0)

## 2013-07-07 LAB — URINE MICROSCOPIC-ADD ON

## 2013-07-07 MED ORDER — FLUCONAZOLE 150 MG PO TABS
150.0000 mg | ORAL_TABLET | Freq: Once | ORAL | Status: AC
  Administered 2013-07-07: 150 mg via ORAL
  Filled 2013-07-07: qty 1

## 2013-07-07 MED ORDER — FLUCONAZOLE 150 MG PO TABS: 150.0000 mg | ORAL_TABLET | Freq: Once | ORAL | Status: DC

## 2013-07-07 MED ORDER — TRIAMCINOLONE ACETONIDE 0.025 % EX OINT: TOPICAL_OINTMENT | Freq: Two times a day (BID) | CUTANEOUS | Status: DC

## 2013-07-07 MED ORDER — NYSTATIN-TRIAMCINOLONE 100000-0.1 UNIT/GM-% EX OINT: TOPICAL_OINTMENT | Freq: Two times a day (BID) | CUTANEOUS | Status: DC

## 2013-07-07 NOTE — MAU Provider Note (Signed)
Attestation of Attending Supervision of Advanced Practitioner (PA/CNM/NP): Evaluation and management procedures were performed by the Advanced Practitioner under my supervision and collaboration.  I have reviewed the Advanced Practitioner's note and chart, and I agree with the management and plan.  Keylani Perlstein, MD, FACOG Attending Obstetrician & Gynecologist Faculty Practice, [facility]  

## 2013-07-07 NOTE — MAU Provider Note (Signed)
Chief Complaint: Abdominal Cramping   First Provider Initiated Contact with Patient 07/07/13 1512     SUBJECTIVE HPI: [name] is a 57 y.o. G0P0 who presents to maternity admissions reporting abdominal cramping and bright red vaginal bleeding x1 episode this morning.  She also reports vaginal itching/burning x2 days.  She recently completed a course of abx for a tooth infection. She denies urinary symptoms, h/a, dizziness, n/v, or fever/chills.     Past Medical History  Diagnosis Date  . HTN (hypertension)     takes Amlodipine daily  . Hyperlipidemia     takes Pravastatin daily  . Dizziness     related to CSF leakage  . Hx of gout   . Diarrhea   . Urinary frequency   . Urinary urgency   . Anxiety     doesn't take any meds for this  . Kidney stones   . Complication of anesthesia 01/2012    reports she extubated herself  . Asthma   . COPD (chronic obstructive pulmonary disease)   . History of chronic bronchitis 07/26/2012    "just about q winter"  . Shortness of breath     "lying down; w/exertion; during heat"  . OSA (obstructive sleep apnea) 07/26/2012    denies CPAP  . Type II diabetes mellitus   . Headache(784.0)     related to CSF leakage  . Stroke 03/2004    denies residual 07/26/2012  . Arthritis     knuckles  . Claustrophobia 07/26/2012   Past Surgical History  Procedure Laterality Date  . Nm myoview ltd  08/25/2011    Low risk study, no evidence of ischemia, EF 44%  . Sphenoidectomy  01/29/2012    Procedure: SPHENOIDECTOMY;  Surgeon: Ruby Cola, MD;  Location: [place];  Service: ENT;  Laterality: Left;  REPAIR OF LEFT SPHENOID    . Nasal sinus surgery  01/29/2012    Procedure: ENDOSCOPIC SINUS SURGERY WITH STEALTH;  Surgeon: Ruby Cola, MD;  Location: [place];  Service: ENT;  Laterality: N/A;  . Total abdominal hysterectomy  1994  . Refractive surgery  ~ 2010    left   History   Social History  . Marital Status: Single    Spouse Name: N/A    Number  of Children: N/A  . Years of Education: N/A   Occupational History  . Not on file.   Social History Main Topics  . Smoking status: Current Every Day Smoker -- 0.25 packs/day for 35 years    Types: Cigarettes  . Smokeless tobacco: Not on file     Comment: 07/26/2012 has decreased smoking from 2ppd; offered cessation materials; pt declines  . Alcohol Use: No  . Drug Use: No  . Sexually Active: No   Other Topics Concern  . Not on file   Social History Narrative   Currently unemployed.  Really wants to find a job, but has been difficult for her.  No children, not married   No current facility-administered medications on file prior to encounter.   Current Outpatient Prescriptions on File Prior to Encounter  Medication Sig Dispense Refill  . amLODipine (NORVASC) 10 MG tablet Take 10 mg by mouth daily.      Marland Kitchen aspirin 325 MG buffered tablet Take 325 mg by mouth daily.      Marland Kitchen FLUoxetine (PROZAC) 10 MG capsule Take 30 mg by mouth every other day.      . gabapentin (NEURONTIN) 300 MG capsule Take 300 mg by mouth  2 (two) times daily.       . insulin aspart (NOVOLOG) 100 UNIT/ML injection Inject 8-10 Units into the skin daily as needed. Please dispense pens      . insulin glargine (LANTUS) 100 UNIT/ML injection Inject 0.9-1 mLs (90-100 Units total) into the skin every morning.  10 mL  6  . lurasidone (LATUDA) 40 MG TABS Take 20 mg by mouth every 3 (three) days.      Marland Kitchen oxyCODONE-acetaminophen (PERCOCET) 5-325 MG per tablet Take 2 tablets by mouth every 4 (four) hours as needed for pain.  10 tablet  0  . pravastatin (PRAVACHOL) 80 MG tablet Take 1 tablet (80 mg total) by mouth daily.  90 tablet  1  . traZODone (DESYREL) 100 MG tablet Take 100 mg by mouth at bedtime.      . valsartan (DIOVAN) 80 MG tablet Take 1 tablet (80 mg total) by mouth daily.  30 tablet  3  . glucose blood (AGAMATRIX PRESTO TEST) test strip Use as instructed  100 each  12   Allergies  Allergen Reactions  . Atorvastatin  Other (See Comments)    "legs cramped; moderate to almost severe"  . Metformin And Related Nausea And Vomiting  . Vicodin (Hydrocodone-Acetaminophen) Nausea And Vomiting  . Ace Inhibitors Cough    ROS: Pertinent items in HPI  OBJECTIVE Blood pressure 132/61, pulse 79, temperature 98 F (36.7 C), temperature source Oral, resp. rate 18, height 5\' 2"  (1.575 m), weight 113.581 kg (250 lb 6.4 oz). GENERAL: Well-developed, well-nourished female in no acute distress.  HEENT: Normocephalic HEART: normal rate RESP: normal effort ABDOMEN: Soft, non-tender EXTREMITIES: Nontender, no edema NEURO: Alert and oriented Pelvic exam: Vaginal cuff well approximated, large amount thick white discharge internally and externally on labia, no blood noted, vaginal walls and external genitalia moderately erythemetous, friable   LAB RESULTS Results for orders placed during the hospital encounter of 07/07/13 (from the past 24 hour(s))  URINALYSIS, ROUTINE W REFLEX MICROSCOPIC     Status: Abnormal   Collection Time    07/07/13  1:53 PM      Result Value Range   Color, Urine YELLOW  YELLOW   APPearance CLOUDY (*) CLEAR   Specific Gravity, Urine >1.030 (*) 1.005 - 1.030   pH 5.5  5.0 - 8.0   Glucose, UA 250 (*) NEGATIVE mg/dL   Hgb urine dipstick SMALL (*) NEGATIVE   Bilirubin Urine NEGATIVE  NEGATIVE   Ketones, ur NEGATIVE  NEGATIVE mg/dL   Protein, ur 30 (*) NEGATIVE mg/dL   Urobilinogen, UA 0.2  0.0 - 1.0 mg/dL   Nitrite NEGATIVE  NEGATIVE   Leukocytes, UA SMALL (*) NEGATIVE  URINE MICROSCOPIC-ADD ON     Status: Abnormal   Collection Time    07/07/13  1:53 PM      Result Value Range   Squamous Epithelial / LPF MANY (*) RARE   WBC, UA 21-50  <3 WBC/hpf   RBC / HPF 0-2  <3 RBC/hpf   Bacteria, UA FEW (*) RARE  WET PREP, GENITAL     Status: Abnormal   Collection Time    07/07/13  3:15 PM      Result Value Range   Yeast Wet Prep HPF POC FEW (*) NONE SEEN   Trich, Wet Prep NONE SEEN  NONE SEEN    Clue Cells Wet Prep HPF POC NONE SEEN  NONE SEEN   WBC, Wet Prep HPF POC MANY (*) NONE SEEN   ASSESSMENT 1. Vaginal  candida     PLAN Diflucan 150 mg PO x1 dose Discharge home Diflucan 150 mg PO x1 additional dose prescribed Mycolog ointment topically PRN F/U in Gyn clinic for routine Gyn care--message sent Return to MAU as needed    Medication List    ASK your doctor about these medications       amLODipine 10 MG tablet  Commonly known as:  NORVASC  Take 10 mg by mouth daily.     aspirin 325 MG buffered tablet  Take 325 mg by mouth daily.     FLUoxetine 10 MG capsule  Commonly known as:  PROZAC  Take 30 mg by mouth every other day.     gabapentin 300 MG capsule  Commonly known as:  NEURONTIN  Take 300 mg by mouth 2 (two) times daily.     glucose blood test strip  Commonly known as:  AGAMATRIX PRESTO TEST  Use as instructed     ibuprofen 800 MG tablet  Commonly known as:  ADVIL,MOTRIN  Take 800 mg by mouth every 8 (eight) hours as needed for pain.     insulin aspart 100 UNIT/ML injection  Commonly known as:  novoLOG  Inject 8-10 Units into the skin daily as needed. Please dispense pens     insulin glargine 100 UNIT/ML injection  Commonly known as:  LANTUS  Inject 0.9-1 mLs (90-100 Units total) into the skin every morning.     lurasidone 40 MG Tabs  Commonly known as:  LATUDA  Take 20 mg by mouth every 3 (three) days.     oxyCODONE-acetaminophen 5-325 MG per tablet  Commonly known as:  PERCOCET  Take 2 tablets by mouth every 4 (four) hours as needed for pain.     pravastatin 80 MG tablet  Commonly known as:  PRAVACHOL  Take 1 tablet (80 mg total) by mouth daily.     traZODone 100 MG tablet  Commonly known as:  DESYREL  Take 100 mg by mouth at bedtime.     valsartan 80 MG tablet  Commonly known as:  DIOVAN  Take 1 tablet (80 mg total) by mouth daily.         Fatima Blank Certified Nurse-Midwife 07/07/2013  3:38 PM

## 2013-07-07 NOTE — MAU Note (Signed)
Pt reports she stared having cramping last night and having some vaginal bleeding. Pt had hysterectomy in 1994.

## 2013-07-08 LAB — URINE CULTURE: Colony Count: >=100000

## 2013-07-08 LAB — GC/CHLAMYDIA PROBE AMP
CT Probe RNA: NEGATIVE
GC Probe RNA: NEGATIVE

## 2013-07-16 ENCOUNTER — Encounter: Payer: Self-pay | Admitting: Family Medicine

## 2013-07-18 ENCOUNTER — Telehealth: Payer: Self-pay | Admitting: Family Medicine

## 2013-07-18 DIAGNOSIS — J329 Chronic sinusitis, unspecified: Secondary | ICD-10-CM

## 2013-07-18 MED ORDER — FLUTICASONE PROPIONATE 50 MCG/ACT NA SUSP: 2.0000 | Freq: Every day | NASAL | Status: DC

## 2013-07-18 NOTE — Telephone Encounter (Signed)
Called patient to discuss the results of the carotid dopplers. Results showed 40-59% stenosis in right and left internal carotid arteries compared to 60-79% last year. Told her that we will continue with the cholesterol medicine as well as recheck ultrasound next year.   Also, for her headache, it could be from a chronic sinusitis. Will send flonase to see if this helps.   She reports that she has been having worsening abdominal cramping. She denies any hematuria but has been having more trouble urinating. This has been chronic for 1 year but worst in the last 5 days. Recommended that if it continues to get worst, for her to be evaluated in clinic this week.   Patient expressed understanding and agreed with plan.   Liam Graham, PGY-3 Family Medicine Resident

## 2013-07-28 ENCOUNTER — Ambulatory Visit: Payer: Medicare Other | Admitting: Family Medicine

## 2013-08-19 ENCOUNTER — Ambulatory Visit: Payer: Medicare Other | Admitting: Family Medicine

## 2013-08-25 ENCOUNTER — Encounter: Payer: Medicare Other | Admitting: Family Medicine

## 2013-09-09 ENCOUNTER — Ambulatory Visit: Payer: Medicare Other | Admitting: Family Medicine

## 2013-09-19 ENCOUNTER — Ambulatory Visit (INDEPENDENT_AMBULATORY_CARE_PROVIDER_SITE_OTHER): Payer: Medicare Other | Admitting: Family Medicine

## 2013-09-19 ENCOUNTER — Encounter: Payer: Self-pay | Admitting: Family Medicine

## 2013-09-19 VITALS — BP 139/84 | HR 70 | Temp 97.5°F | Ht 62.0 in | Wt 251.0 lb

## 2013-09-19 DIAGNOSIS — Z23 Encounter for immunization: Secondary | ICD-10-CM

## 2013-09-19 DIAGNOSIS — R42 Dizziness and giddiness: Secondary | ICD-10-CM

## 2013-09-19 DIAGNOSIS — M2550 Pain in unspecified joint: Secondary | ICD-10-CM

## 2013-09-19 DIAGNOSIS — IMO0001 Reserved for inherently not codable concepts without codable children: Secondary | ICD-10-CM

## 2013-09-19 DIAGNOSIS — E1165 Type 2 diabetes mellitus with hyperglycemia: Secondary | ICD-10-CM

## 2013-09-19 LAB — POCT GLYCOSYLATED HEMOGLOBIN (HGB A1C): Hemoglobin A1C: 10.4

## 2013-09-19 MED ORDER — TRAMADOL HCL 50 MG PO TABS: 50.0000 mg | ORAL_TABLET | Freq: Two times a day (BID) | ORAL | Status: DC | PRN

## 2013-09-19 NOTE — Patient Instructions (Signed)
We will call you with the MRI appointment. It is very important for you to see the neurologist.  Follow up with me in 1 month.

## 2013-09-20 LAB — T3, FREE: T3, Free: 2.6 pg/mL (ref 2.3–4.2)

## 2013-09-20 LAB — CBC WITH DIFFERENTIAL/PLATELET
Basophils Absolute: 0 10*3/uL (ref 0.0–0.1)
Basophils Relative: 0 % (ref 0–1)
Eosinophils Absolute: 0.3 10*3/uL (ref 0.0–0.7)
Eosinophils Relative: 4 % (ref 0–5)
HCT: 46.6 % — ABNORMAL HIGH (ref 36.0–46.0)
Hemoglobin: 15.5 g/dL — ABNORMAL HIGH (ref 12.0–15.0)
Lymphocytes Relative: 25 % (ref 12–46)
Lymphs Abs: 2.3 10*3/uL (ref 0.7–4.0)
MCH: 29 pg (ref 26.0–34.0)
MCHC: 33.3 g/dL (ref 30.0–36.0)
MCV: 87.1 fL (ref 78.0–100.0)
Monocytes Absolute: 0.4 10*3/uL (ref 0.1–1.0)
Monocytes Relative: 5 % (ref 3–12)
Neutro Abs: 6 10*3/uL (ref 1.7–7.7)
Neutrophils Relative %: 66 % (ref 43–77)
Platelets: 234 10*3/uL (ref 150–400)
RBC: 5.35 MIL/uL — ABNORMAL HIGH (ref 3.87–5.11)
RDW: 15.3 % (ref 11.5–15.5)
WBC: 9.1 10*3/uL (ref 4.0–10.5)

## 2013-09-20 LAB — TSH: TSH: 2.51 u[IU]/mL (ref 0.350–4.500)

## 2013-09-20 LAB — RHEUMATOID FACTOR: Rhuematoid fact SerPl-aCnc: 113 IU/mL — ABNORMAL HIGH (ref <=14)

## 2013-09-20 LAB — T4, FREE: Free T4: 1.02 ng/dL (ref 0.80–1.80)

## 2013-09-20 LAB — CYCLIC CITRUL PEPTIDE ANTIBODY, IGG: Cyclic Citrullin Peptide Ab: <2 U/mL (ref 0.0–5.0)

## 2013-09-21 DIAGNOSIS — M2550 Pain in unspecified joint: Secondary | ICD-10-CM | POA: Insufficient documentation

## 2013-09-21 NOTE — Assessment & Plan Note (Signed)
Did not examine all joints to better characterize pain, due to limited time and more pressing issues at hand.  - trial of tramadol since she can't take NSAID's in setting of renal insufficiency - labwork to rule out rheumatoid component: RF, CCP. Also obtained CBC and TSH.

## 2013-09-21 NOTE — Assessment & Plan Note (Addendum)
She is a complex patient with h/o previous stroke, CSF leak and 60-70% stenosis of carotid arteries. Unclear what dizziness is caused by at this time, but in setting of complex patient, will obtain MRI to rule out cerebellar stroke. Normal pressure hydrocephalus is also part of the differential and MRI would be helpful in that respect as well.  Strongly suggested that she be seen by [facility] as soon as they have available appointment.

## 2013-09-21 NOTE — Progress Notes (Addendum)
Patient ID: [name]    DOB: 01/09/1956, 57 y.o.   MRN: [id number] --- Subjective:  [name] is a 57 y.o.female who presents with concern for loss of balance and dizziness.  - loss of balance: she has had problems with this in the past, but has had a recurrence more acutely in the last 2 weeks. She reports each day getting worst. Dizziness is present with walking and sitting. Her headache has been stable not worst. No change in vision or decreased visual fields compared to baseline.  She had an appointment with [facility] neurologist which she missed due to taking care of her sister who is in the hospital.  - She also reports feeling her whole body feeling weak with aches and pains in various joints.    ROS: see HPI Past Medical History: reviewed and updated medications and allergies. Social History: Tobacco: daily smoker 1/4 pack per day  Objective: Filed Vitals:   09/19/13 1401  BP: 139/84  Pulse: 70  Temp: 97.5 F (36.4 C)    Physical Examination:   General appearance - alert, well appearing, and in no distress Chest - clear to auscultation, no wheezes, rales or rhonchi, symmetric air entry Heart - normal rate, regular rhythm, normal S1, S2, 2/6 systolic flow murmur Neuro - CN2-12 grossly intact, decreased peripheral vision on left side compared tp right, but stable per patient.  Positive Romberg. Normal finger to nose. Normal rapid alternating movements.   Imaging:  MRI HEAD WITHOUT CONTRAST : 07/26/2012 MRA HEAD WITHOUT CONTRAST  Technique: Multiplanar, multiecho pulse sequences of the brain and  surrounding structures were obtained without intravenous contrast.  Angiographic images of the head were obtained using MRA technique  without contrast.  Comparison: CT head without contrast 06/27/2012. MRI brain  04/14/2012.  MRI HEAD  Findings: The diffusion weighted images demonstrate no evidence  for acute or subacute infarction. Mild periventricular and  scattered  subcortical T2 and FLAIR hyperintensity is similar to the  prior study. The previous left middle cranial fossa encephalocele  is stable. Remote lacunar infarcts of the basal ganglia are stable.  No acute infarct, hemorrhage, mass lesion is present. The  ventricles are normal size. Flow is present in the major  intracranial arteries. Postoperative changes of the sinuses are  stable. Mild mucosal thickening is present in the left maxillary  sinus. There is some fluid in the inferior left frontal sinus.  Aeration is significantly improved. The mastoid air cells are  clear.  IMPRESSION:  1. No acute intracranial abnormality or significant interval  change.  2. Stable left middle cranial fossa encephalocele.  3. Stable atrophy and white matter disease matter disease.  4. Improved aeration of the sinuses bilaterally with stable  postsurgical changes.  MRA HEAD  Findings: A 2 mm left posterior communicating artery aneurysm is  present. The internal carotid arteries are otherwise within normal  limits bilaterally. The A1 and M1 segments are normal. The MCA  bifurcations are within normal limits. There is some attenuation  of distal MCA branch vessels bilaterally. The anterior  communicating artery is patent.  There is some signal loss in the vertebral arteries bilaterally.  There is likely some degree of atherosclerotic disease. Some of  this is artifactual. No focal stenosis or aneurysm is present.  The right posterior communicating artery is of fetal type with a  small right P1 segment the left posterior cerebral artery  originates from basilar tip. The basilar artery demonstrates mild  irregularity proximally without significant  stenosis. There is  attenuation of distal PCA branch vessels.  IMPRESSION:  1. Mild small vessel disease.  2. Signal loss in the vertebral arteries bilaterally is likely  artifactual without a significant focal stenosis.  3. 2 mm left posterior communicating  artery aneurysm.

## 2013-09-26 ENCOUNTER — Telehealth: Payer: Self-pay | Admitting: Family Medicine

## 2013-09-26 NOTE — Telephone Encounter (Signed)
Forward to PCP for Rx.Busick, [name]

## 2013-09-26 NOTE — Telephone Encounter (Signed)
Pt called and would like 1 nerve pill called in to her pharmacy, because she is having a MRI tomorrow and is claustrophobic and needs to be calm for the MRI. JW

## 2013-09-27 ENCOUNTER — Ambulatory Visit (HOSPITAL_COMMUNITY)
Admission: RE | Admit: 2013-09-27 | Discharge: 2013-09-27 | Disposition: A | Discharge status: Home / Self Care | Payer: Medicare Other | Source: Ambulatory Visit | Attending: Family Medicine | Admitting: Family Medicine

## 2013-09-27 ENCOUNTER — Telehealth: Payer: Self-pay | Admitting: Family Medicine

## 2013-09-27 DIAGNOSIS — E119 Type 2 diabetes mellitus without complications: Secondary | ICD-10-CM | POA: Insufficient documentation

## 2013-09-27 DIAGNOSIS — R51 Headache: Secondary | ICD-10-CM | POA: Insufficient documentation

## 2013-09-27 DIAGNOSIS — I10 Essential (primary) hypertension: Secondary | ICD-10-CM | POA: Insufficient documentation

## 2013-09-27 DIAGNOSIS — R42 Dizziness and giddiness: Secondary | ICD-10-CM

## 2013-09-27 DIAGNOSIS — Q019 Encephalocele, unspecified: Secondary | ICD-10-CM | POA: Insufficient documentation

## 2013-09-27 MED ORDER — GLUCOSE BLOOD VI STRP: ORAL_STRIP | Status: DC

## 2013-09-27 MED ORDER — "INSULIN SYRINGE 29G X 1/2"" 1 ML MISC": 1.0000 | Freq: Three times a day (TID) | Status: DC

## 2013-09-27 NOTE — Telephone Encounter (Signed)
Called patient back regarding nerve pill for claustrophobia with MRI today. She states that she had taken xanax in the past with good result.  Will order xanax 1mg  1-2 tablets 54min prior to MRI, dispense 2 tablets.  Also, told patient about a1c of 10. Will increase meal coverage to 12 units from 8-10units. Recommended that she follow up with the pharmacy clinic. She is instructed to record her cbg's.  Patient expressed understanding and agreed with plan.   Liam Graham, PGY-3 Family Medicine Resident

## 2013-09-28 NOTE — Telephone Encounter (Signed)
Called patient to discuss MRI results but did not get any answer or any voice mail.  MRI showing artifact vs meningitis vs subarachnoid hemorrhage. Her clinical presentation did not suggest subarachnoid or meningitis, but would like to discuss with her red flags to be aware of, as well as let her know that she needs to follow up with me in 1 week. If she is not better or worst in terms of her dizziness, I will go ahead and arrange for an LP.   Liam Graham, PGY-3 Family Medicine Resident

## 2013-09-29 ENCOUNTER — Telehealth: Payer: Self-pay | Admitting: Family Medicine

## 2013-09-29 DIAGNOSIS — R51 Headache: Secondary | ICD-10-CM

## 2013-09-29 NOTE — Telephone Encounter (Signed)
Called patient to discuss MRI results with her as well as to see how she is doing. She states that her dizziness is getting worst and that she is not comfortable with how she is doing. She says that this is feeling similarly to when she had the CSF leak. I explained the differential possibilities regarding her MRI: artifact vs meningitis vs subarachnoid. I told her that way to figure that out would be LP. Since she is getting worst, I think it is reasonable to arrange for LP and to get her to see her ENT: Dr. Simeon Craft for further recommendations. I also advised her to go to the ED if she became any worst, had fevers, nuchal rigidity... Patient agreed with plan.   Liam Graham, PGY-3 Family Medicine Resident

## 2013-09-30 ENCOUNTER — Emergency Department (HOSPITAL_COMMUNITY): Payer: Medicare Other

## 2013-09-30 ENCOUNTER — Encounter (HOSPITAL_COMMUNITY): Payer: Self-pay | Admitting: Emergency Medicine

## 2013-09-30 ENCOUNTER — Telehealth: Payer: Self-pay | Admitting: Family Medicine

## 2013-09-30 ENCOUNTER — Emergency Department (HOSPITAL_COMMUNITY)
Admission: EM | Admit: 2013-09-30 | Discharge: 2013-09-30 | Disposition: A | Discharge status: Home / Self Care | Payer: Medicare Other | Attending: Emergency Medicine | Admitting: Emergency Medicine

## 2013-09-30 DIAGNOSIS — R519 Headache, unspecified: Secondary | ICD-10-CM

## 2013-09-30 DIAGNOSIS — Z7982 Long term (current) use of aspirin: Secondary | ICD-10-CM | POA: Insufficient documentation

## 2013-09-30 DIAGNOSIS — E785 Hyperlipidemia, unspecified: Secondary | ICD-10-CM | POA: Insufficient documentation

## 2013-09-30 DIAGNOSIS — R51 Headache: Secondary | ICD-10-CM | POA: Insufficient documentation

## 2013-09-30 DIAGNOSIS — Z8673 Personal history of transient ischemic attack (TIA), and cerebral infarction without residual deficits: Secondary | ICD-10-CM | POA: Insufficient documentation

## 2013-09-30 DIAGNOSIS — E119 Type 2 diabetes mellitus without complications: Secondary | ICD-10-CM | POA: Insufficient documentation

## 2013-09-30 DIAGNOSIS — F411 Generalized anxiety disorder: Secondary | ICD-10-CM | POA: Insufficient documentation

## 2013-09-30 DIAGNOSIS — I10 Essential (primary) hypertension: Secondary | ICD-10-CM | POA: Insufficient documentation

## 2013-09-30 DIAGNOSIS — R110 Nausea: Secondary | ICD-10-CM | POA: Insufficient documentation

## 2013-09-30 DIAGNOSIS — M542 Cervicalgia: Secondary | ICD-10-CM | POA: Insufficient documentation

## 2013-09-30 DIAGNOSIS — J449 Chronic obstructive pulmonary disease, unspecified: Secondary | ICD-10-CM | POA: Insufficient documentation

## 2013-09-30 DIAGNOSIS — Z9889 Other specified postprocedural states: Secondary | ICD-10-CM | POA: Insufficient documentation

## 2013-09-30 DIAGNOSIS — F17200 Nicotine dependence, unspecified, uncomplicated: Secondary | ICD-10-CM | POA: Insufficient documentation

## 2013-09-30 DIAGNOSIS — Z79899 Other long term (current) drug therapy: Secondary | ICD-10-CM | POA: Insufficient documentation

## 2013-09-30 DIAGNOSIS — Z794 Long term (current) use of insulin: Secondary | ICD-10-CM | POA: Insufficient documentation

## 2013-09-30 DIAGNOSIS — R270 Ataxia, unspecified: Secondary | ICD-10-CM

## 2013-09-30 DIAGNOSIS — Z87448 Personal history of other diseases of urinary system: Secondary | ICD-10-CM | POA: Insufficient documentation

## 2013-09-30 DIAGNOSIS — Z87442 Personal history of urinary calculi: Secondary | ICD-10-CM | POA: Insufficient documentation

## 2013-09-30 DIAGNOSIS — J4489 Other specified chronic obstructive pulmonary disease: Secondary | ICD-10-CM | POA: Insufficient documentation

## 2013-09-30 DIAGNOSIS — R279 Unspecified lack of coordination: Secondary | ICD-10-CM | POA: Insufficient documentation

## 2013-09-30 DIAGNOSIS — R42 Dizziness and giddiness: Secondary | ICD-10-CM | POA: Insufficient documentation

## 2013-09-30 DIAGNOSIS — M129 Arthropathy, unspecified: Secondary | ICD-10-CM | POA: Insufficient documentation

## 2013-09-30 LAB — CBC WITH DIFFERENTIAL/PLATELET
Basophils Absolute: 0 10*3/uL (ref 0.0–0.1)
Basophils Relative: 0 % (ref 0–1)
Eosinophils Absolute: 0.3 10*3/uL (ref 0.0–0.7)
Eosinophils Relative: 4 % (ref 0–5)
HCT: 46.4 % — ABNORMAL HIGH (ref 36.0–46.0)
Hemoglobin: 14.6 g/dL (ref 12.0–15.0)
Lymphocytes Relative: 25 % (ref 12–46)
Lymphs Abs: 2.2 10*3/uL (ref 0.7–4.0)
MCH: 28.8 pg (ref 26.0–34.0)
MCHC: 31.5 g/dL (ref 30.0–36.0)
MCV: 91.5 fL (ref 78.0–100.0)
Monocytes Absolute: 0.4 10*3/uL (ref 0.1–1.0)
Monocytes Relative: 5 % (ref 3–12)
Neutro Abs: 5.8 10*3/uL (ref 1.7–7.7)
Neutrophils Relative %: 66 % (ref 43–77)
Platelets: 185 10*3/uL (ref 150–400)
RBC: 5.07 MIL/uL (ref 3.87–5.11)
RDW: 14.4 % (ref 11.5–15.5)
WBC: 8.7 10*3/uL (ref 4.0–10.5)

## 2013-09-30 LAB — POCT I-STAT, CHEM 8
BUN: 14 mg/dL (ref 6–23)
Calcium, Ion: 1.18 mmol/L (ref 1.12–1.23)
Chloride: 97 mEq/L (ref 96–112)
Creatinine, Ser: 1.2 mg/dL — ABNORMAL HIGH (ref 0.50–1.10)
Glucose, Bld: 207 mg/dL — ABNORMAL HIGH (ref 70–99)
HCT: 49 % — ABNORMAL HIGH (ref 36.0–46.0)
Hemoglobin: 16.7 g/dL — ABNORMAL HIGH (ref 12.0–15.0)
Potassium: 3.8 mEq/L (ref 3.5–5.1)
Sodium: 142 mEq/L (ref 135–145)
TCO2: 32 mmol/L (ref 0–100)

## 2013-09-30 LAB — CSF CELL COUNT WITH DIFFERENTIAL
RBC Count, CSF: 1 /mm3 — ABNORMAL HIGH
RBC Count, CSF: 17 /mm3 — ABNORMAL HIGH
Tube #: 1
Tube #: 4
WBC, CSF: 3 /mm3 (ref 0–5)
WBC, CSF: 4 /mm3 (ref 0–5)

## 2013-09-30 LAB — GRAM STAIN

## 2013-09-30 LAB — PROTEIN AND GLUCOSE, CSF
Glucose, CSF: 121 mg/dL — ABNORMAL HIGH (ref 43–76)
Total  Protein, CSF: 39 mg/dL (ref 15–45)

## 2013-09-30 MED ORDER — HYDROCODONE-ACETAMINOPHEN 5-325 MG PO TABS: 2.0000 | ORAL_TABLET | ORAL | Status: DC | PRN

## 2013-09-30 NOTE — ED Notes (Addendum)
Pt reports having hx of spinal fluid leak and chronic HA. Pt was seen by PCP to r/o stroke on Monday and had MRI done. Pt was negative for stroke but PCP recommended she been seen in ER to r/o meningitis. Pt has been having dizziness, off balance for several months that has increased in the past two weeks.

## 2013-09-30 NOTE — Telephone Encounter (Signed)
Called patient to see how she was feeling this morning. She states she feels the same as yesterday. Since her symptoms have not resolved and are getting worst, I recommended that she go to the ED to be evaluated and LP to be done. With MRI findings concerning for subarachnoid hemorrhage vs meningitis vs artifact, she needs an LP sooner than later and I'm afraid that getting an outpatient LP may take too long.  Patient expressed understanding and agreed with plan.   Liam Graham, PGY-3 Family Medicine Resident

## 2013-09-30 NOTE — ED Provider Notes (Signed)
CSN: [id number]     Arrival date & time 09/30/13  1059 History   First MD Initiated Contact with Patient 09/30/13 1128     Chief Complaint  Patient presents with  . Headache  . Neck Pain   (Consider location/radiation/quality/duration/timing/severity/associated sxs/prior Treatment) HPI Comments: Patient presents with headache and neck pain. She has a history of an encephalocele that results in her spinal fluid leak through her nose 2 years ago. She had surgery through her nose by Dr. Simeon Craft.  She states she's had ongoing headaches since that surgery 2 years ago. She states her headaches are unchanged. She's had some ataxia for the last 2-3 months which she says has gotten worse in the last 2-3 weeks. She notes some pain along the right side of her neck this started about 2 or 3 days ago. She denies any photophobia. She denies any vomiting but she's had a little bit of nausea. She denies he fevers or chills. She was seen by her primary care physician at the family practice center who had ordered an MRI on October 21. I discussed this MRI with the radiologist, Dr. Jeannine Kitten who states that there is some increased signal around the encephalocele which appears to be relatively unchanged. However there was some increased signal on the FLAIR sequence in the right parietal area which could possibly indicate subarachnoid hemorrhage or meningitis. He states it also could be artifact but he cannot completely exclude subarachnoid hemorrhage or meningitis. Given this she was sent over here for lumbar puncture. She apparently has had a difficult time with lumbar punctures in the past and request a fluoroscopy guided LP.  Patient is a 57 y.o. female presenting with headaches and neck pain.  Headache Associated symptoms: dizziness and neck pain   Associated symptoms: no abdominal pain, no back pain, no congestion, no cough, no diarrhea, no fatigue, no fever, no nausea, no numbness and no vomiting   Neck Pain Associated  symptoms: headaches   Associated symptoms: no chest pain, no fever, no numbness and no weakness     Past Medical History  Diagnosis Date  . HTN (hypertension)     takes Amlodipine daily  . Hyperlipidemia     takes Pravastatin daily  . Dizziness     related to CSF leakage  . Hx of gout   . Diarrhea   . Urinary frequency   . Urinary urgency   . Anxiety     doesn't take any meds for this  . Kidney stones   . Complication of anesthesia 01/2012    reports she extubated herself  . Asthma   . COPD (chronic obstructive pulmonary disease)   . History of chronic bronchitis 07/26/2012    "just about q winter"  . Shortness of breath     "lying down; w/exertion; during heat"  . OSA (obstructive sleep apnea) 07/26/2012    denies CPAP  . Type II diabetes mellitus   . Headache(784.0)     related to CSF leakage  . Stroke 03/2004    denies residual 07/26/2012  . Arthritis     knuckles  . Claustrophobia 07/26/2012   Past Surgical History  Procedure Laterality Date  . Nm myoview ltd  08/25/2011    Low risk study, no evidence of ischemia, EF 44%  . Sphenoidectomy  01/29/2012    Procedure: SPHENOIDECTOMY;  Surgeon: Ruby Cola, MD;  Location: [facility];  Service: ENT;  Laterality: Left;  REPAIR OF LEFT SPHENOID    . Nasal sinus surgery  01/29/2012    Procedure: ENDOSCOPIC SINUS SURGERY WITH STEALTH;  Surgeon: Ruby Cola, MD;  Location: [facility];  Service: ENT;  Laterality: N/A;  . Total abdominal hysterectomy  1994  . Refractive surgery  ~ 2010    left   Family History  Problem Relation Age of Onset  . Colon cancer Sister 60  . Breast cancer Mother 21  . Diabetes Sister 24  . Diabetes Sister 49  . Lung cancer Father   . Anesthesia problems Neg Hx   . Hypotension Neg Hx   . Malignant hyperthermia Neg Hx   . Pseudochol deficiency Neg Hx    History  Substance Use Topics  . Smoking status: Current Every Day Smoker -- 0.25 packs/day for 35 years    Types: Cigarettes  . Smokeless tobacco:  Not on file     Comment: 07/26/2012 has decreased smoking from 2ppd; offered cessation materials; pt declines  . Alcohol Use: No   OB History   Grav Para Term Preterm Abortions TAB SAB Ect Mult Living   0              Review of Systems  Constitutional: Negative for fever, chills, diaphoresis and fatigue.  HENT: Negative for congestion, rhinorrhea and sneezing.   Eyes: Negative.   Respiratory: Negative for cough, chest tightness and shortness of breath.   Cardiovascular: Negative for chest pain and leg swelling.  Gastrointestinal: Negative for nausea, vomiting, abdominal pain, diarrhea and blood in stool.  Genitourinary: Negative for frequency, hematuria, flank pain and difficulty urinating.  Musculoskeletal: Positive for neck pain. Negative for arthralgias and back pain.  Skin: Negative for rash.  Neurological: Positive for dizziness and headaches. Negative for speech difficulty, weakness and numbness.    Allergies  Atorvastatin; Metformin and related; and Ace inhibitors  Home Medications   Current Outpatient Rx  Name  Route  Sig  Dispense  Refill  . aspirin 325 MG buffered tablet   Oral   Take 325 mg by mouth daily.         Marland Kitchen FLUoxetine (PROZAC) 10 MG capsule   Oral   Take 30 mg by mouth at bedtime as needed (for anxiety).          . gabapentin (NEURONTIN) 400 MG capsule   Oral   Take 400 mg by mouth 3 (three) times daily.         . insulin aspart (NOVOLOG) 100 UNIT/ML injection   Subcutaneous   Inject 10 Units into the skin 3 (three) times daily after meals. Please dispense pens         . insulin glargine (LANTUS) 100 UNIT/ML injection   Subcutaneous   Inject 100-110 Units into the skin 2 (two) times daily.         . pravastatin (PRAVACHOL) 80 MG tablet   Oral   Take 1 tablet (80 mg total) by mouth daily.   90 tablet   1   . traMADol (ULTRAM) 50 MG tablet   Oral   Take 1 tablet (50 mg total) by mouth 2 (two) times daily as needed for pain.   60  tablet   1   . traZODone (DESYREL) 100 MG tablet   Oral   Take 100 mg by mouth at bedtime as needed for sleep.          . valsartan (DIOVAN) 80 MG tablet   Oral   Take 1 tablet (80 mg total) by mouth daily.   30 tablet   3  BP 142/63  Pulse 77  Temp(Src) 98 F (36.7 C) (Oral)  Resp 22  Ht 5\' 2"  (1.575 m)  Wt 250 lb (113.399 kg)  BMI 45.71 kg/m2  SpO2 95% Physical Exam  Constitutional: She is oriented to person, place, and time. She appears well-developed and well-nourished.  HENT:  Head: Normocephalic and atraumatic.  Eyes: Pupils are equal, round, and reactive to light.  Neck: Normal range of motion. Neck supple.  Mild tenderness to the right paraspinal muscles. There's also tenderness with movement of the neck but knew meningeal signs  Cardiovascular: Normal rate, regular rhythm and normal heart sounds.   Pulmonary/Chest: Effort normal and breath sounds normal. No respiratory distress. She has no wheezes. She has no rales. She exhibits no tenderness.  Abdominal: Soft. Bowel sounds are normal. There is no tenderness. There is no rebound and no guarding.  Musculoskeletal: Normal range of motion. She exhibits no edema.  Lymphadenopathy:    She has no cervical adenopathy.  Neurological: She is alert and oriented to person, place, and time. She has normal strength. No cranial nerve deficit or sensory deficit. GCS eye subscore is 4. GCS verbal subscore is 5. GCS motor subscore is 6.  Finger to nose intact, no pronator drift  Skin: Skin is warm and dry. No rash noted.  Psychiatric: She has a normal mood and affect.    ED Course  Procedures (including critical care time) Labs Review Labs Reviewed  CBC WITH DIFFERENTIAL - Abnormal; Notable for the following:    HCT 46.4 (*)    All other components within normal limits  CSF CELL COUNT WITH DIFFERENTIAL - Abnormal; Notable for the following:    Appearance, CSF CLEAR (*)    RBC Count, CSF 17 (*)    All other components  within normal limits  PROTEIN AND GLUCOSE, CSF - Abnormal; Notable for the following:    Glucose, CSF 121 (*)    All other components within normal limits  CSF CELL COUNT WITH DIFFERENTIAL - Abnormal; Notable for the following:    Appearance, CSF CLEAR (*)    RBC Count, CSF 1 (*)    All other components within normal limits  POCT I-STAT, CHEM 8 - Abnormal; Notable for the following:    Creatinine, Ser 1.20 (*)    Glucose, Bld 207 (*)    Hemoglobin 16.7 (*)    HCT 49.0 (*)    All other components within normal limits  GRAM STAIN  CSF CULTURE   Imaging Review Dg Fluoro Guide Lumbar Puncture  09/30/2013   CLINICAL DATA:  Headache  EXAM: DIAGNOSTIC LUMBAR PUNCTURE UNDER FLUOROSCOPIC GUIDANCE  FLUOROSCOPY TIME:  1 minute and 26 seconds  PROCEDURE: Informed consent was obtained from the patient prior to the procedure, including potential complications of headache, allergy, and pain. Additional risks of not obtaining CSF and bleeding within the spinal canal requiring surgical evacuation were discussed. The With the patient prone, the lower back was prepped with Betadine. 1% Lidocaine was used for local anesthesia.  Using fluoroscopic guidance, an appropriate skin site over the lower back was identified. Lumbar puncture was performed at the L4-5 level using a 20 gauge needle with return of clear CSF with an opening pressure of 24 cm water. 41ml of CSF were obtained for laboratory studies. The patient tolerated the procedure well and there were no apparent complications.  IMPRESSION: Successful fluoro guided lumbar puncture at L4-5 without evidence for immediate complications.   Electronically Signed   By: Misty Stanley  M.D.   On: 09/30/2013 15:13    EKG Interpretation   None       MDM   1. Headache    Patient does not have any suggestion of subarachnoid hemorrhage or meningitis on LP. LP was done under fluoroscopy. I discussed the patient with Dr. Gracy Racer and the plan was for the patient  followup with family practice clinic if LP was unremarkable and she'll have further workup from there.    Malvin Johns, MD 09/30/13 [phone]

## 2013-09-30 NOTE — ED Notes (Signed)
Pt returned from IR.

## 2013-09-30 NOTE — ED Notes (Signed)
Pt here c/o HA and neck pain and some balance issues; pt with hx of sx x 2 years ago and sts HA x 2 years; pt sts sent here to r/o meningitis; pt denies fever or vomiting

## 2013-09-30 NOTE — Procedures (Signed)
Fluro-guided lumbar puncture performed at L4-5 level without complications.  6cc clear csf obtained.   Opening pressure 24 cm H2O.  See dictated radiology note for full details.

## 2013-09-30 NOTE — Telephone Encounter (Signed)
Entered referral with ENT, Dr. Simeon Craft for evaluation of new CSF leak in context of worsening ataxia.   Liam Graham, PGY-3 Family Medicine Resident

## 2013-10-03 LAB — CSF CULTURE

## 2013-10-03 LAB — CSF CULTURE W GRAM STAIN: Culture: NO GROWTH

## 2013-10-05 ENCOUNTER — Other Ambulatory Visit: Payer: Self-pay | Admitting: Family Medicine

## 2013-10-18 ENCOUNTER — Telehealth: Payer: Self-pay | Admitting: Family Medicine

## 2013-10-18 ENCOUNTER — Ambulatory Visit (INDEPENDENT_AMBULATORY_CARE_PROVIDER_SITE_OTHER): Payer: Medicare Other | Admitting: Family Medicine

## 2013-10-18 ENCOUNTER — Encounter: Payer: Self-pay | Admitting: Family Medicine

## 2013-10-18 VITALS — BP 162/95 | HR 89 | Temp 98.1°F | Ht 62.0 in | Wt 250.0 lb

## 2013-10-18 DIAGNOSIS — M2550 Pain in unspecified joint: Secondary | ICD-10-CM

## 2013-10-18 DIAGNOSIS — M79609 Pain in unspecified limb: Secondary | ICD-10-CM

## 2013-10-18 DIAGNOSIS — M25569 Pain in unspecified knee: Secondary | ICD-10-CM

## 2013-10-18 DIAGNOSIS — N6459 Other signs and symptoms in breast: Secondary | ICD-10-CM

## 2013-10-18 DIAGNOSIS — M79644 Pain in right finger(s): Secondary | ICD-10-CM

## 2013-10-18 DIAGNOSIS — M25561 Pain in right knee: Secondary | ICD-10-CM

## 2013-10-18 NOTE — Patient Instructions (Signed)
For the aches and pains, please start drinking up to1.5L a day of water and start exercising.  For the blood pressure, check your blood pressure at the pharmacy and bring me a log back. Follow up in 1 month.

## 2013-10-18 NOTE — Progress Notes (Signed)
Patient ID: INDEA HARBOR    DOB: 24-Jan-1956, 57 y.o.   MRN: [id number] --- Subjective:  Scarleth is a 57 y.o.female who presents with the following concerns:  - knot under left arm: noticed 2 knots under her arm 2-3 nights ago. Yesterday, she did not feel it anymore. No pain. She does get sore on her right breast at times.  Last mammogram: 03/23/13: scattered fibroglandular pattern. Repeat in 1 year.  Family History: mother: breast cancer, sister with breast cancer, and 2 aunts with breast cancer.   - pain and stiffness in joints: cramping in toes from right foot. Soreness of left index finger which wakes her up at night throbbing. Morning stiffness in am for 4-5 minutes, present in low back and neck.  She fell 1.65months ago on her right knee and hand. She fell directly o her knee cap. Doesn't remember twisting her knee. Since then, she has had pain in her right knee with walking. Her 5th finger also looks like it was broken and hurts when she tries to move it.   ROS: see HPI Past Medical History: reviewed and updated medications and allergies. Social History: Tobacco: 1/4 pack per day  Objective: Filed Vitals:   10/18/13 0905  BP: 162/95  Pulse: 89  Temp: 98.1 F (36.7 C)    Physical Examination:   General appearance - alert, well appearing, and in no distress Right hand: 5th finger not aligning with other fingers, can passively move it but has pain with active movement. Normal DIP strength and PIP strength.  Right knee - no joint effusion or soft tissue swelling noted, tenderness along patella and along medial and lateral aspects of joint. Negative anterior drawer, negative mcmurrys, normal varus and valgus stress testing.  Breast - no mass or axillary lymphadenopathy noted bilaterally, no nipple discharge, no discoloration.

## 2013-10-21 NOTE — Telephone Encounter (Signed)
Entered in error

## 2013-10-22 DIAGNOSIS — M79644 Pain in right finger(s): Secondary | ICD-10-CM | POA: Insufficient documentation

## 2013-10-22 DIAGNOSIS — N6459 Other signs and symptoms in breast: Secondary | ICD-10-CM | POA: Insufficient documentation

## 2013-10-22 DIAGNOSIS — M25561 Pain in right knee: Secondary | ICD-10-CM | POA: Insufficient documentation

## 2013-10-22 NOTE — Assessment & Plan Note (Signed)
No evidence of mass or lymphadenopathy on exam.  Patient does have a very strong family history of breast cancer however. Urged her to ask her mother's oncologist about BRCA 1-2 gene  In family members. I think this would be useful for patient as she already has 74 female family members with breast CA including mother and sister

## 2013-10-22 NOTE — Assessment & Plan Note (Signed)
Pain in 5th finger with some misalignment of finger.  Xray hand. If fracture, refer to hand surgery

## 2013-10-22 NOTE — Assessment & Plan Note (Addendum)
S/p fall 1.5 months ago.  - obtain xray to rule out any acute fracture - patient already on vicodin for low back pain started by previous doctor - if no acute findings, consider physical therapy or joint injection

## 2013-10-22 NOTE — Assessment & Plan Note (Addendum)
Joints affected appear to be limited to left hand and right foot. Given length of stiffness of 5 minutes unlikely to be RF. Discussed with patient positive RF and negative CCP. DIscussed non specificity of it. Agreed to continue monitoring for now. If continues to be an issue, would get CRP and sed rate.  For now, increase water intake and start walking routine. Patient agreed to this.

## 2013-10-31 ENCOUNTER — Emergency Department (HOSPITAL_COMMUNITY): Payer: Medicare Other

## 2013-10-31 ENCOUNTER — Encounter (HOSPITAL_COMMUNITY): Payer: Self-pay | Admitting: Emergency Medicine

## 2013-10-31 ENCOUNTER — Emergency Department (HOSPITAL_COMMUNITY)
Admission: EM | Admit: 2013-10-31 | Discharge: 2013-10-31 | Disposition: A | Discharge status: Home / Self Care | Payer: Medicare Other | Attending: Emergency Medicine | Admitting: Emergency Medicine

## 2013-10-31 DIAGNOSIS — E785 Hyperlipidemia, unspecified: Secondary | ICD-10-CM | POA: Insufficient documentation

## 2013-10-31 DIAGNOSIS — Z87442 Personal history of urinary calculi: Secondary | ICD-10-CM | POA: Insufficient documentation

## 2013-10-31 DIAGNOSIS — S8991XA Unspecified injury of right lower leg, initial encounter: Secondary | ICD-10-CM

## 2013-10-31 DIAGNOSIS — J4489 Other specified chronic obstructive pulmonary disease: Secondary | ICD-10-CM | POA: Insufficient documentation

## 2013-10-31 DIAGNOSIS — Z794 Long term (current) use of insulin: Secondary | ICD-10-CM | POA: Insufficient documentation

## 2013-10-31 DIAGNOSIS — E119 Type 2 diabetes mellitus without complications: Secondary | ICD-10-CM | POA: Insufficient documentation

## 2013-10-31 DIAGNOSIS — W010XXA Fall on same level from slipping, tripping and stumbling without subsequent striking against object, initial encounter: Secondary | ICD-10-CM | POA: Insufficient documentation

## 2013-10-31 DIAGNOSIS — M129 Arthropathy, unspecified: Secondary | ICD-10-CM | POA: Insufficient documentation

## 2013-10-31 DIAGNOSIS — Z8673 Personal history of transient ischemic attack (TIA), and cerebral infarction without residual deficits: Secondary | ICD-10-CM | POA: Insufficient documentation

## 2013-10-31 DIAGNOSIS — I10 Essential (primary) hypertension: Secondary | ICD-10-CM | POA: Insufficient documentation

## 2013-10-31 DIAGNOSIS — G4733 Obstructive sleep apnea (adult) (pediatric): Secondary | ICD-10-CM | POA: Insufficient documentation

## 2013-10-31 DIAGNOSIS — J449 Chronic obstructive pulmonary disease, unspecified: Secondary | ICD-10-CM | POA: Insufficient documentation

## 2013-10-31 DIAGNOSIS — S8990XA Unspecified injury of unspecified lower leg, initial encounter: Secondary | ICD-10-CM | POA: Insufficient documentation

## 2013-10-31 DIAGNOSIS — S62636A Displaced fracture of distal phalanx of right little finger, initial encounter for closed fracture: Secondary | ICD-10-CM

## 2013-10-31 DIAGNOSIS — F411 Generalized anxiety disorder: Secondary | ICD-10-CM | POA: Insufficient documentation

## 2013-10-31 DIAGNOSIS — Y939 Activity, unspecified: Secondary | ICD-10-CM | POA: Insufficient documentation

## 2013-10-31 DIAGNOSIS — Z79899 Other long term (current) drug therapy: Secondary | ICD-10-CM | POA: Insufficient documentation

## 2013-10-31 DIAGNOSIS — Z7982 Long term (current) use of aspirin: Secondary | ICD-10-CM | POA: Insufficient documentation

## 2013-10-31 DIAGNOSIS — Y929 Unspecified place or not applicable: Secondary | ICD-10-CM | POA: Insufficient documentation

## 2013-10-31 DIAGNOSIS — S62639A Displaced fracture of distal phalanx of unspecified finger, initial encounter for closed fracture: Secondary | ICD-10-CM | POA: Insufficient documentation

## 2013-10-31 DIAGNOSIS — F17200 Nicotine dependence, unspecified, uncomplicated: Secondary | ICD-10-CM | POA: Insufficient documentation

## 2013-10-31 DIAGNOSIS — W19XXXA Unspecified fall, initial encounter: Secondary | ICD-10-CM

## 2013-10-31 MED ORDER — HYDROCODONE-ACETAMINOPHEN 5-325 MG PO TABS: 2.0000 | ORAL_TABLET | ORAL | Status: DC | PRN

## 2013-10-31 NOTE — ED Notes (Signed)
Ortho called for finger splint.

## 2013-10-31 NOTE — ED Provider Notes (Signed)
CSN: [id number]     Arrival date & time 10/31/13  1453 History  This chart was scribed for Leota Jacobsen, MD by Roxan Diesel, ED scribe.  This patient was seen in room WTR5/WTR5 and the patient's care was started at 4:05 PM.   Chief Complaint  Patient presents with  . Knee Pain  . Hand Pain    The history is provided by the patient. No language interpreter was used.    HPI Comments: [name] is a 57 y.o. female who presents to the Emergency Department complaining of a right hand and right knee injury sustained in a fall on 09/09/13.  Pt states that she tripped and fell onto a sidewalk and landed on her right hand and both knees.  Since then she has had constant severe throbbing pain to her right pinky finger.  She also notes swelling to the finger and difficulty moving that finger.  She also complains of pain to both knees though it is more severe in the right knee.  She has attempted to treat pain with Tramadol, without relief.   Past Medical History  Diagnosis Date  . HTN (hypertension)     takes Amlodipine daily  . Hyperlipidemia     takes Pravastatin daily  . Dizziness     related to CSF leakage  . Hx of gout   . Diarrhea   . Urinary frequency   . Urinary urgency   . Anxiety     doesn't take any meds for this  . Kidney stones   . Complication of anesthesia 01/2012    reports she extubated herself  . Asthma   . COPD (chronic obstructive pulmonary disease)   . History of chronic bronchitis 07/26/2012    "just about q winter"  . Shortness of breath     "lying down; w/exertion; during heat"  . OSA (obstructive sleep apnea) 07/26/2012    denies CPAP  . Type II diabetes mellitus   . Headache(784.0)     related to CSF leakage  . Stroke 03/2004    denies residual 07/26/2012  . Arthritis     knuckles  . Claustrophobia 07/26/2012    Past Surgical History  Procedure Laterality Date  . Nm myoview ltd  08/25/2011    Low risk study, no evidence of ischemia, EF 44%   . Sphenoidectomy  01/29/2012    Procedure: SPHENOIDECTOMY;  Surgeon: Ruby Cola, MD;  Location: [facility];  Service: ENT;  Laterality: Left;  REPAIR OF LEFT SPHENOID    . Nasal sinus surgery  01/29/2012    Procedure: ENDOSCOPIC SINUS SURGERY WITH STEALTH;  Surgeon: Ruby Cola, MD;  Location: [facility];  Service: ENT;  Laterality: N/A;  . Total abdominal hysterectomy  1994  . Refractive surgery  ~ 2010    left    Family History  Problem Relation Age of Onset  . Colon cancer Sister 1  . Breast cancer Mother 33  . Diabetes Sister 31  . Diabetes Sister 89  . Lung cancer Father   . Anesthesia problems Neg Hx   . Hypotension Neg Hx   . Malignant hyperthermia Neg Hx   . Pseudochol deficiency Neg Hx     History  Substance Use Topics  . Smoking status: Current Every Day Smoker -- 0.50 packs/day for 35 years    Types: Cigarettes  . Smokeless tobacco: Not on file     Comment: 07/26/2012 has decreased smoking from 2ppd; offered cessation materials; pt declines  .  Alcohol Use: No    OB History   Grav Para Term Preterm Abortions TAB SAB Ect Mult Living   0               Review of Systems  Musculoskeletal: Positive for arthralgias and joint swelling.  All other systems reviewed and are negative.     Allergies  Atorvastatin; Metformin and related; and Ace inhibitors  Home Medications   Current Outpatient Rx  Name  Route  Sig  Dispense  Refill  . aspirin 325 MG buffered tablet   Oral   Take 325 mg by mouth daily.         Marland Kitchen FLUoxetine (PROZAC) 10 MG capsule   Oral   Take 30 mg by mouth at bedtime as needed (for anxiety).          . gabapentin (NEURONTIN) 400 MG capsule   Oral   Take 400 mg by mouth 3 (three) times daily.         . insulin aspart (NOVOLOG) 100 UNIT/ML injection   Subcutaneous   Inject 10 Units into the skin 3 (three) times daily after meals. Please dispense pens         . insulin glargine (LANTUS) 100 UNIT/ML injection   Subcutaneous   Inject  100-110 Units into the skin 2 (two) times daily.         . pravastatin (PRAVACHOL) 80 MG tablet   Oral   Take 1 tablet (80 mg total) by mouth daily.   90 tablet   1   . traMADol (ULTRAM) 50 MG tablet   Oral   Take 1 tablet (50 mg total) by mouth 2 (two) times daily as needed for pain.   60 tablet   1   . traZODone (DESYREL) 100 MG tablet   Oral   Take 100 mg by mouth at bedtime as needed for sleep.          . valsartan (DIOVAN) 80 MG tablet   Oral   Take 1 tablet (80 mg total) by mouth daily.   30 tablet   3    BP 140/74  Pulse 102  Temp(Src) 98.5 F (36.9 C) (Oral)  Resp 16  SpO2 96%  Physical Exam  Nursing note and vitals reviewed. Constitutional: She is oriented to person, place, and time. She appears well-developed and well-nourished. No distress.  HENT:  Head: Normocephalic and atraumatic.  Eyes: EOM are normal.  Neck: Neck supple. No tracheal deviation present.  Cardiovascular: Normal rate.   Pulmonary/Chest: Effort normal. No respiratory distress.  Musculoskeletal:  Right anterior knee tenderness to palpation over patellar tendon.  No obvious deformity or edema.  ROM slightly limited due to pain.   Right 5th finger generalized tenderness to palpation.  Edema noted.  ROM limited due to pain.  No obvious deformity.  Neurological: She is alert and oriented to person, place, and time.  Sensation intact of distal extremities.  Skin: Skin is warm and dry.  Psychiatric: She has a normal mood and affect. Her behavior is normal.    ED Course  Procedures (including critical care time)  DIAGNOSTIC STUDIES: Oxygen Saturation is 96% on room air, normal by my interpretation.    COORDINATION OF CARE: 4:08 PM-Discussed treatment plan which includes splint application, pain medication, and hand specialist f/u with pt at bedside and pt agreed to plan.    Labs Review Labs Reviewed - No data to display   Imaging Review Dg Knee Complete 4 Views  Right  10/31/2013   CLINICAL DATA:  Fall with right knee pain  EXAM: RIGHT KNEE - COMPLETE 4+ VIEW  COMPARISON:  None.  FINDINGS: No fracture. No subluxation or dislocation. There is no joint effusion. No worrisome lytic or sclerotic osseous abnormality.  IMPRESSION: Negative.   Electronically Signed   By: Misty Stanley M.D.   On: 10/31/2013 15:47   Dg Finger Little Right  10/31/2013   CLINICAL DATA:  Fall.  Pain.  EXAM: RIGHT LITTLE FINGER 2+V  COMPARISON:  None.  FINDINGS: A tiny fracture is noted along the ulnar aspect of the proximal epiphysis of the distal phalanx of the right 5th digit. This may extend into the joint space. This is minimally displaced. No other abnormality identified.  IMPRESSION: Tiny minimally displaced fracture of the base of the distal phalanx of the right 5th digit .   Electronically Signed   By: Marcello Moores  Register   On: 10/31/2013 15:43    EKG Interpretation   None       MDM   1. Fall, initial encounter   2. Right knee injury, initial encounter   3. Fracture of fifth finger, distal phalanx, right, closed, initial encounter     4:19 PM Xrays show right distal phalanx avulsion fracture. Patient will have vicodin for pain. Patient will have finger splint applied. Patient will follow up with Dr. Burney Gauze for further evaluation. No neurovascular compromise.     I personally performed the services described in this documentation, which was scribed in my presence. The recorded information has been reviewed and is accurate.    Alvina Chou, PA-C 10/31/13 1621

## 2013-10-31 NOTE — ED Notes (Signed)
Pt sts she fell about a month and a half ago and her R knee and R little finger have hurt ever since. Pt c/o  tingling in R arm. Denies Numbness. Pt. Ambulated to Triage 5. AND noted, A&Ox4

## 2013-11-02 NOTE — ED Provider Notes (Signed)
Medical screening examination/treatment/procedure(s) were performed by non-physician practitioner and as supervising physician I was immediately available for consultation/collaboration.  Leota Jacobsen, MD 11/02/13 [phone]

## 2013-11-24 ENCOUNTER — Ambulatory Visit (INDEPENDENT_AMBULATORY_CARE_PROVIDER_SITE_OTHER): Payer: Medicare Other | Admitting: Family Medicine

## 2013-11-24 ENCOUNTER — Telehealth: Payer: Self-pay | Admitting: Family Medicine

## 2013-11-24 ENCOUNTER — Encounter: Payer: Self-pay | Admitting: Family Medicine

## 2013-11-24 VITALS — BP 150/94 | HR 87 | Ht 62.0 in | Wt 248.0 lb

## 2013-11-24 DIAGNOSIS — E1165 Type 2 diabetes mellitus with hyperglycemia: Secondary | ICD-10-CM

## 2013-11-24 DIAGNOSIS — F329 Major depressive disorder, single episode, unspecified: Secondary | ICD-10-CM

## 2013-11-24 DIAGNOSIS — F32A Depression, unspecified: Secondary | ICD-10-CM

## 2013-11-24 DIAGNOSIS — IMO0001 Reserved for inherently not codable concepts without codable children: Secondary | ICD-10-CM

## 2013-11-24 DIAGNOSIS — I10 Essential (primary) hypertension: Secondary | ICD-10-CM

## 2013-11-24 MED ORDER — HYDROCHLOROTHIAZIDE 12.5 MG PO TABS: 12.5000 mg | ORAL_TABLET | Freq: Every day | ORAL | Status: DC

## 2013-11-24 MED ORDER — FLUOXETINE HCL 10 MG PO CAPS: 40.0000 mg | ORAL_CAPSULE | Freq: Every day | ORAL | Status: DC

## 2013-11-24 NOTE — Telephone Encounter (Signed)
Refill request for insulin needles and test strips.

## 2013-11-24 NOTE — Patient Instructions (Signed)
I recommend that you meet with our psychologist, Dr. Zella Ball, for help dealing with your anxiety.  You can schedule an appointment with her by calling her directly at [phone].  We are increasing the prozac to 40mg .  For the blood pressure, we are starting the medicine Hydrochlorothiazide. Follow up with me at the beginning of January.   Also, exercise for 30 minutes three times a week.

## 2013-11-25 NOTE — Telephone Encounter (Signed)
Needles for lantus pen Agamatrix is the machine she uses.  Needs strips and needles for that

## 2013-11-26 MED ORDER — INSULIN PEN NEEDLE 29G X 12.7MM MISC: 1.0000 "application " | Freq: Three times a day (TID) | Status: DC

## 2013-11-26 MED ORDER — GLUCOSE BLOOD VI STRP: ORAL_STRIP | Status: DC

## 2013-11-26 MED ORDER — AGAMATRIX ULTRA-THIN LANCETS MISC: 1.0000 "application " | Freq: Three times a day (TID) | Status: DC

## 2013-11-26 NOTE — Telephone Encounter (Signed)
Sent to pharmacy test strips, lancets and BD pen needles.   Liam Graham, PGY-3 Family Medicine Resident

## 2013-11-27 NOTE — Assessment & Plan Note (Signed)
Follow up in first half of January for repeat A1C and focused Diabetes visit

## 2013-11-27 NOTE — Assessment & Plan Note (Signed)
Will increase prozac to 40mg .  Patient interested in calling Dr. Gwenlyn Saran to see about counseling options.

## 2013-11-27 NOTE — Progress Notes (Signed)
Patient ID: [name]    DOB: 1956/11/10, 57 y.o.   MRN: [id number] --- Subjective:  [name] is a 57 y.o.female who presents for follow up on HTN and depression.  # Hypertension: Medications: valsartan 80mg  daily Number of doses missed in 1 week: 0 BP at home: doesn't check Exercise routine: doesn't feel like walking anymore Salt in diet: yes Chest pain: no Lower extremity swelling: not in lower extremities but in hands Shortness of breath: no Headache: no Change in vision: no Last BMP and lipid panel: 07/27/13: LDL: 138, HDL: 33, chol: 234  - depression: anxiety attacks with feeling that she is dying occurs once a week. She also has some anxiety when she around so many people. Has been ongoing for months. She used to go to [facility] which did not work for her. She tried group therapy which she did not like. She takes latuda and prozac which she doesn't think is helping much. She denies any SI/HI.  Phq9:20, somewhat difficult    ROS: see HPI Past Medical History: reviewed and updated medications and allergies. Social History: Tobacco: current smoker  Objective: Filed Vitals:   11/24/13 0911  BP: 150/94  Pulse: 87    Physical Examination:   General appearance - alert, well appearing, and in no distress Chest - clear to auscultation, no wheezes, rales or rhonchi, symmetric air entry Heart - normal rate, regular rhythm, normal S1, S2, no murmurs, rubs, clicks or gallopsly  Extremities - trace pitting edema

## 2013-11-27 NOTE — Assessment & Plan Note (Signed)
Not at goal for patient with diabetes.  Will add hydrochlorothiazide 12.5mg  to losartan 80mg .  Talked about starting back walking for both HTN and depression.  Low salt diet Follow up in 3 weeks for repeat BP and BMP.

## 2013-12-14 ENCOUNTER — Telehealth: Payer: Self-pay | Admitting: Family Medicine

## 2013-12-14 ENCOUNTER — Encounter: Payer: Self-pay | Admitting: Family Medicine

## 2013-12-14 ENCOUNTER — Ambulatory Visit (INDEPENDENT_AMBULATORY_CARE_PROVIDER_SITE_OTHER): Payer: Medicare Other | Admitting: Family Medicine

## 2013-12-14 VITALS — BP 166/94 | HR 80 | Temp 98.0°F | Ht 62.0 in | Wt 249.0 lb

## 2013-12-14 DIAGNOSIS — J111 Influenza due to unidentified influenza virus with other respiratory manifestations: Secondary | ICD-10-CM

## 2013-12-14 DIAGNOSIS — R69 Illness, unspecified: Secondary | ICD-10-CM

## 2013-12-14 DIAGNOSIS — I10 Essential (primary) hypertension: Secondary | ICD-10-CM

## 2013-12-14 MED ORDER — BENZONATATE 100 MG PO CAPS: 100.0000 mg | ORAL_CAPSULE | Freq: Three times a day (TID) | ORAL | Status: DC | PRN

## 2013-12-14 MED ORDER — OSELTAMIVIR PHOSPHATE 75 MG PO CAPS: 75.0000 mg | ORAL_CAPSULE | Freq: Two times a day (BID) | ORAL | Status: DC

## 2013-12-14 NOTE — Progress Notes (Signed)
Patient ID: [name]    DOB: [birth year], 58 y.o.   MRN: [id number] --- Subjective:  [name] is a 58 y.o.female who presents for acute visit with vomiting, cough and body aches.  Symptoms started Monday. She reports starting with a sore throat. She has had some nonproductive cough that has been keeping her up at night. She reports body aches and pains. She checked her temperature this morning and had a temperature 100.2. She took Tylenol this morning at 5 AM. She has been having headache associated with the cough. She has had 2 episodes of vomiting yesterday and continues to be nauseous. She also had a bout of diarrhea yesterday which has since resolved. She has been keeping fluids down which have included juice and water. She was in the emergency room accompanying her sister over the weekend and was around a lot of sick people.  - Additionally, she reports some new onset right-sided back and groin pain. This has started 2-3 weeks ago. Is intermittent and doesn't bother her much. It occurs occasionally and lasts a few minutes. She does have a history of uric acid kidney stones  ROS: see HPI Past Medical History: reviewed and updated medications and allergies. Social History: Tobacco: Current every day smoker. Half a pack a day.  Objective: Filed Vitals:   12/14/13 1127  BP: 166/94  Pulse: 80  Temp: 98 F (36.7 C)    Physical Examination:   General appearance - alert, tired appearing, and in no distress Nose - erythematous and congested nasal turbinates bilaterally Mouth - mucous membranes moist, erythematous oropharynx, no tonsillar exudate Neck - supple, tenderness along left side of neck, no obvious lymphadenopathy, moving neck without stiffness Chest - clear to auscultation, no wheezes, rales or rhonchi, symmetric air entry Heart - normal rate, regular rhythm, normal S1, S2, no murmurs Abdomen - soft, tender in right and left lower quadrants, no CVA tenderness

## 2013-12-14 NOTE — Assessment & Plan Note (Signed)
Patient was started on hydrochlorothiazide 12.5 mg in addition to her olmesartan 80 mg daily. Her blood pressure today is still elevated. I wonder if this has something to do with her acute illness. I advised her to return to clinic for check on her blood pressure and her diabetes in a week once she gets better. Upon review of her chart, I noticed that she has a history of uric acid kidney stones. I noticed this after starting her on hydrochlorothiazide. I wonder if some of the back and groin pain that she is having is from kidney stone flare up. Patient does like the hydrochlorothiazide as it has helped her with some swelling. She would like to continue it for now. We will reevaluate this at her next visit.

## 2013-12-14 NOTE — Telephone Encounter (Signed)
Pt called and said that she is to sick to come in today for your visit and would like Dr. Otis Dials to call her in some antibiotics and cough syrup for her flu. jw

## 2013-12-14 NOTE — Assessment & Plan Note (Addendum)
Symptoms very consistent with flu. She did receive the vaccine but this season's flu vaccine coverage has not been optimal and does not exclude her getting flu. Given her history of diabetes and the onset of symptoms in the last 48 hours, will start her on 10 flu. Creatinine clearance is greater than 60 and we'll therefore prescribe her the regular 75 milligrams twice a day for 5 days dosage. Normal chest exam inpatient not hypoxic or short of breath. We'll hold on chest x-ray for now. Reviewed red flags for return to care.

## 2013-12-14 NOTE — Telephone Encounter (Signed)
Spoke with patient.  At 5 am this morning, fever of 101.0.  Symptoms started yesterday morning of vomiting and diarrhea.  She has vomited twice and had 3-4 diarrhea stools along with a dry cough.  I will forward this message to MD and return patient call.  Kileen Lange, Loralyn [place], [place]

## 2013-12-14 NOTE — Patient Instructions (Signed)
Please follow up with me in 1 week for blood pressure and diabetes check.   Influenza, Adult Influenza ("the flu") is a viral infection of the respiratory tract. It occurs more often in winter months because people spend more time in close contact with one another. Influenza can make you feel very sick. Influenza easily spreads from person to person (contagious). CAUSES  Influenza is caused by a virus that infects the respiratory tract. You can catch the virus by breathing in droplets from an infected person's cough or sneeze. You can also catch the virus by touching something that was recently contaminated with the virus and then touching your mouth, nose, or eyes. SYMPTOMS  Symptoms typically last 4 to 10 days and may include:  Fever.  Chills.  Headache, body aches, and muscle aches.  Sore throat.  Chest discomfort and cough.  Poor appetite.  Weakness or feeling tired.  Dizziness.  Nausea or vomiting. DIAGNOSIS  Diagnosis of influenza is often made based on your history and a physical exam. A nose or throat swab test can be done to confirm the diagnosis. RISKS AND COMPLICATIONS You may be at risk for a more severe case of influenza if you smoke cigarettes, have diabetes, have chronic heart disease (such as heart failure) or lung disease (such as asthma), or if you have a weakened immune system. Elderly people and pregnant women are also at risk for more serious infections. The most common complication of influenza is a lung infection (pneumonia). Sometimes, this complication can require emergency medical care and may be life-threatening. PREVENTION  An annual influenza vaccination (flu shot) is the best way to avoid getting influenza. An annual flu shot is now routinely recommended for all adults in the [place] TREATMENT  In mild cases, influenza goes away on its own. Treatment is directed at relieving symptoms. For more severe cases, your caregiver may prescribe antiviral medicines  to shorten the sickness. Antibiotic medicines are not effective, because the infection is caused by a virus, not by bacteria. HOME CARE INSTRUCTIONS  Only take over-the-counter or prescription medicines for pain, discomfort, or fever as directed by your caregiver.  Use a cool mist humidifier to make breathing easier.  Get plenty of rest until your temperature returns to normal. This usually takes 3 to 4 days.  Drink enough fluids to keep your urine clear or pale yellow.  Cover your mouth and nose when coughing or sneezing, and wash your hands well to avoid spreading the virus.  Stay home from work or school until your fever has been gone for at least 1 full day. SEEK MEDICAL CARE IF:   You have chest pain or a deep cough that worsens or produces more mucus.  You have nausea, vomiting, or diarrhea. SEEK IMMEDIATE MEDICAL CARE IF:   You have difficulty breathing, shortness of breath, or your skin or nails turn bluish.  You have severe neck pain or stiffness.  You have a severe headache, facial pain, or earache.  You have a worsening or recurring fever.  You have nausea or vomiting that cannot be controlled. MAKE SURE YOU:  Understand these instructions.  Will watch your condition.  Will get help right away if you are not doing well or get worse. Document Released: 11/21/2000 Document Revised: 05/25/2012 Document Reviewed: 02/23/2012 [facility] Patient Information 2014 [place], [place].

## 2013-12-23 ENCOUNTER — Ambulatory Visit (INDEPENDENT_AMBULATORY_CARE_PROVIDER_SITE_OTHER): Payer: Medicare Other | Admitting: Family Medicine

## 2013-12-23 ENCOUNTER — Encounter: Payer: Self-pay | Admitting: Family Medicine

## 2013-12-23 VITALS — BP 164/87 | HR 80 | Ht 62.0 in | Wt 245.0 lb

## 2013-12-23 DIAGNOSIS — F329 Major depressive disorder, single episode, unspecified: Secondary | ICD-10-CM

## 2013-12-23 DIAGNOSIS — F3289 Other specified depressive episodes: Secondary | ICD-10-CM

## 2013-12-23 DIAGNOSIS — I10 Essential (primary) hypertension: Secondary | ICD-10-CM

## 2013-12-23 DIAGNOSIS — E119 Type 2 diabetes mellitus without complications: Secondary | ICD-10-CM

## 2013-12-23 DIAGNOSIS — IMO0002 Reserved for concepts with insufficient information to code with codable children: Secondary | ICD-10-CM

## 2013-12-23 DIAGNOSIS — F32A Depression, unspecified: Secondary | ICD-10-CM

## 2013-12-23 DIAGNOSIS — IMO0001 Reserved for inherently not codable concepts without codable children: Secondary | ICD-10-CM

## 2013-12-23 DIAGNOSIS — E1165 Type 2 diabetes mellitus with hyperglycemia: Secondary | ICD-10-CM

## 2013-12-23 LAB — POCT GLYCOSYLATED HEMOGLOBIN (HGB A1C): Hemoglobin A1C: 12

## 2013-12-23 MED ORDER — INSULIN ASPART 100 UNIT/ML ~~LOC~~ SOLN: 15.0000 [IU] | Freq: Three times a day (TID) | SUBCUTANEOUS | Status: DC

## 2013-12-23 NOTE — Patient Instructions (Signed)
Please make an appointment with Dr. Valentina Lucks at the pharmacy clinic in 2 weeks to go over your sugars and adjust your insulin.  Also, make an appointment with the pharmacy clinic for a 24hr blood pressure monitoring.   For now,  - increase novolog to 15 units with meals - check your sugars 4 times a day with meals and before bedtime - make appointment with eye doctor  I will call you if anything is to be adjusted with the blood work.   I recommend that you meet with our psychologist, Dr. Zella Ball, for help dealing with your anxiety.  You can schedule an appointment with her by calling her directly at [phone].

## 2013-12-24 LAB — COMPREHENSIVE METABOLIC PANEL
ALT: 17 U/L (ref 0–35)
AST: 21 U/L (ref 0–37)
Albumin: 4.1 g/dL (ref 3.5–5.2)
Alkaline Phosphatase: 98 U/L (ref 39–117)
BUN: 11 mg/dL (ref 6–23)
CO2: 28 mEq/L (ref 19–32)
Calcium: 8.8 mg/dL (ref 8.4–10.5)
Chloride: 99 mEq/L (ref 96–112)
Creat: 1.04 mg/dL (ref 0.50–1.10)
Glucose, Bld: 213 mg/dL — ABNORMAL HIGH (ref 70–99)
Potassium: 3.9 mEq/L (ref 3.5–5.3)
Sodium: 139 mEq/L (ref 135–145)
Total Bilirubin: 0.6 mg/dL (ref 0.3–1.2)
Total Protein: 6.8 g/dL (ref 6.0–8.3)

## 2013-12-24 LAB — LIPID PANEL
Cholesterol: 216 mg/dL — ABNORMAL HIGH (ref 0–200)
HDL: 41 mg/dL (ref >39)
LDL Cholesterol: 140 mg/dL — ABNORMAL HIGH (ref 0–99)
Total CHOL/HDL Ratio: 5.3 Ratio
Triglycerides: 177 mg/dL — ABNORMAL HIGH (ref <150)
VLDL: 35 mg/dL (ref 0–40)

## 2013-12-24 NOTE — Assessment & Plan Note (Signed)
Not at goal for diabetic patient. She reports lower BP's outside of office which makes me think that 24hr BP monitoring would be helpful.  - set up 24hr monitor - if high, could increase valsartan. Hesitate to increase HCTZ because of her history of uric acid kidney stones.

## 2013-12-24 NOTE — Progress Notes (Signed)
Patient ID: [name]    DOB: 1956-07-04, 58 y.o.   MRN: [id number] --- Subjective:  [name] is a 58 y.o.female with h/o DM2, HTN who presents for follow up on HTN and Diabetes.   # Diabetes: - medications: novolog 20 units at meal time, typically two meals per day and antus 100units in am and 100units at night.  - missed doses in 1 week: none, skips lantus dose when CBG's are less than 200 but this has not recently been the case - hypoglycemic events: none - CBG's at home: 300's - frequency of monitoring: twice a day. Ran out of strips, using her sister's - changes in vision? Some blurred vision despite glasses - numbness or tingling in lower extremities? Sharp shooting pain in feet - sores or ulcers? none - seen by podiatry? no - last eye check? Over a year ago - diet: high fat and sugar - exercise: none  # Hypertension: Medications: amlodipine, hctz 12.5, valsartan 80mg  daily Number of doses missed in 1 week: none BP at home: 124/88 when checked at drugstore Exercise routine: none Salt in diet: trying to watch salt Chest pain: none Lower extremity swelling: none Shortness of breath: none Headache: none Change in vision: blurred vision, but no double vision   Anxiety and depression: patient reporting feelings of anxiety attacks where all of a sudden she feels like her heart is beating fast and she feels very anxious. This happens at rest. Lasts from 10 to 30 minutes at a time. Resolves on its own. Has not yet called Dr. Gwenlyn Saran to set up appointment but would like to. Takes prozac 40mg  daily  ROS: see HPI Past Medical History: reviewed and updated medications and allergies. Social History: Tobacco: current smoker  Objective: Filed Vitals:   12/23/13 1101  BP: 164/87  Pulse: 80    Physical Examination:   General appearance - alert, well appearing, and in no distress Neck - supple, no significant adenopathy Chest - clear to auscultation, no wheezes, rales or  rhonchi, symmetric air entry Heart - normal rate, regular rhythm, normal S1, S2, no murmurs Extremities - no pitting edema, +2 DP pulses bilaterally, heel callus present bilaterally, no sores or ulcers, mild decrease in microfilament testing of great toe, otherwise normal.

## 2013-12-24 NOTE — Assessment & Plan Note (Signed)
Uncontrolled with A1C of 12.  Patient not on metformin because of GI upset from it. SHe states that she would be willing to try it again if need be.  - increase novolog to 15units at meals from 10 units.  - wrote for new meter and strips so that she can start checking 4 times daily. - make appointment with pharmacy clinic in 2 weeks with CBG readings to better adjust insulin.  - patient to make appointment at ophthalmology for diabetic retinopathy screening.

## 2013-12-24 NOTE — Assessment & Plan Note (Signed)
With component of anxiety.  - recommended that patient call Dr. Gwenlyn Saran for appointment

## 2013-12-29 ENCOUNTER — Other Ambulatory Visit: Payer: Self-pay | Admitting: Family Medicine

## 2013-12-29 DIAGNOSIS — E119 Type 2 diabetes mellitus without complications: Secondary | ICD-10-CM

## 2013-12-29 MED ORDER — INSULIN LISPRO 100 UNIT/ML ~~LOC~~ SOLN: 15.0000 [IU] | Freq: Three times a day (TID) | SUBCUTANEOUS | Status: DC

## 2014-01-05 ENCOUNTER — Ambulatory Visit: Payer: Medicare Other | Admitting: Pharmacist

## 2014-01-10 ENCOUNTER — Ambulatory Visit: Payer: Medicare Other | Admitting: Pharmacist

## 2014-01-27 ENCOUNTER — Ambulatory Visit: Payer: Medicare Other | Admitting: Pharmacist

## 2014-01-31 ENCOUNTER — Ambulatory Visit: Payer: Medicare Other | Admitting: Family Medicine

## 2014-03-15 ENCOUNTER — Ambulatory Visit
Admission: RE | Admit: 2014-03-15 | Discharge: 2014-03-15 | Disposition: A | Discharge status: Home / Self Care | Payer: Medicare Other | Source: Ambulatory Visit | Attending: Family Medicine | Admitting: Family Medicine

## 2014-03-15 ENCOUNTER — Encounter: Payer: Self-pay | Admitting: Family Medicine

## 2014-03-15 ENCOUNTER — Ambulatory Visit (INDEPENDENT_AMBULATORY_CARE_PROVIDER_SITE_OTHER): Payer: Medicare Other | Admitting: Family Medicine

## 2014-03-15 VITALS — BP 150/82 | HR 72 | Temp 98.4°F | Ht 62.0 in | Wt 248.0 lb

## 2014-03-15 DIAGNOSIS — R0600 Dyspnea, unspecified: Secondary | ICD-10-CM

## 2014-03-15 DIAGNOSIS — R0989 Other specified symptoms and signs involving the circulatory and respiratory systems: Secondary | ICD-10-CM

## 2014-03-15 DIAGNOSIS — R0609 Other forms of dyspnea: Secondary | ICD-10-CM

## 2014-03-15 LAB — CBC
HCT: 44.9 % (ref 36.0–46.0)
Hemoglobin: 15.4 g/dL — ABNORMAL HIGH (ref 12.0–15.0)
MCH: 30.6 pg (ref 26.0–34.0)
MCHC: 34.3 g/dL (ref 30.0–36.0)
MCV: 89.3 fL (ref 78.0–100.0)
Platelets: 180 10*3/uL (ref 150–400)
RBC: 5.03 MIL/uL (ref 3.87–5.11)
RDW: 15.6 % — ABNORMAL HIGH (ref 11.5–15.5)
WBC: 7 10*3/uL (ref 4.0–10.5)

## 2014-03-15 LAB — BASIC METABOLIC PANEL
BUN: 16 mg/dL (ref 6–23)
CO2: 28 mEq/L (ref 19–32)
Calcium: 9 mg/dL (ref 8.4–10.5)
Chloride: 101 mEq/L (ref 96–112)
Creat: 1.11 mg/dL — ABNORMAL HIGH (ref 0.50–1.10)
Glucose, Bld: 206 mg/dL — ABNORMAL HIGH (ref 70–99)
Potassium: 3.9 mEq/L (ref 3.5–5.3)
Sodium: 140 mEq/L (ref 135–145)

## 2014-03-15 MED ORDER — LEVOFLOXACIN 750 MG PO TABS: 750.0000 mg | ORAL_TABLET | Freq: Every day | ORAL | Status: DC

## 2014-03-15 MED ORDER — IPRATROPIUM BROMIDE 0.06 % NA SOLN: 2.0000 | Freq: Four times a day (QID) | NASAL | Status: DC

## 2014-03-15 MED ORDER — HYDROCODONE-HOMATROPINE 5-1.5 MG/5ML PO SYRP: 5.0000 mL | ORAL_SOLUTION | Freq: Three times a day (TID) | ORAL | Status: DC | PRN

## 2014-03-15 NOTE — Patient Instructions (Signed)

## 2014-03-15 NOTE — Progress Notes (Signed)
Patient ID: [name]    DOB: 01-Dec-1956, 58 y.o.   MRN: [id number] --- Subjective:  [name] is a 58 y.o.female who presents with shortness of breath and cough.  Started 5 days ago, getting worst. Cough is productive of green sptyum. She has had some associated chills and low grade temp of 100.1. Cough is worst at night. She has had some shortness of breath with laying down and walking short distances. She has some associated nasal congestion and vomiting associated with coughing. She stays nauseated daily since this started.  No sick contacts. Tried mucinex and delsym which did not work.    ROS: see HPI Past Medical History: reviewed and updated medications and allergies. Social History: Tobacco:  Objective: Filed Vitals:   03/15/14 1602  BP: 150/82  Pulse: 72  Temp: 98.4 F (36.9 C)   O2: 92% on room air Physical Examination:   General appearance - alert, well appearing, and in no distress Ears - bilateral TM's and external ear canals normal Nose - erythematous and congested nasal turbinates bilaterally Mouth - erythematous oropharynx, no tonsilar exudates Neck - supple, no significant adenopathy Chest - crackles on left lower and mid lung, normal work of breathing and breath sounds heard throughout.  Heart - normal rate, regular rhythm, normal S1, S2, no murmurs

## 2014-03-16 ENCOUNTER — Telehealth: Payer: Self-pay | Admitting: Family Medicine

## 2014-03-16 DIAGNOSIS — R0600 Dyspnea, unspecified: Secondary | ICD-10-CM | POA: Insufficient documentation

## 2014-03-16 NOTE — Telephone Encounter (Signed)
Called patient and left message letting her know that cxr was normal but that based on my exam yesterday, I still think there may be a component of atypical pneumonia. Will continue antibiotic treatment.   Liam Graham, PGY-3 Family Medicine Resident

## 2014-03-16 NOTE — Assessment & Plan Note (Signed)
Associated with productive cough and low grade temperature and physical exam with crackles on left. cxr did not show evidence of pneumonia, but will nonetheless empirically treat for atypical pneumonia with levaquin, given patient's more complicated medical history including DM, CSF leak.  - hycodan for cough - bmp to evaluate for BUN in setting of possible PNA - ipratropium nasal spray for nasal congestion.

## 2014-03-21 ENCOUNTER — Other Ambulatory Visit: Payer: Self-pay | Admitting: *Deleted

## 2014-03-21 MED ORDER — PRAVASTATIN SODIUM 80 MG PO TABS: 80.0000 mg | ORAL_TABLET | Freq: Every day | ORAL | Status: AC

## 2014-06-13 ENCOUNTER — Ambulatory Visit: Payer: Medicare Other | Admitting: Family Medicine

## 2014-06-19 ENCOUNTER — Ambulatory Visit (INDEPENDENT_AMBULATORY_CARE_PROVIDER_SITE_OTHER): Payer: Medicare Other | Admitting: Family Medicine

## 2014-06-19 ENCOUNTER — Encounter: Payer: Self-pay | Admitting: Family Medicine

## 2014-06-19 VITALS — BP 125/79 | HR 69 | Temp 97.7°F | Ht 62.0 in | Wt 247.0 lb

## 2014-06-19 DIAGNOSIS — E1165 Type 2 diabetes mellitus with hyperglycemia: Secondary | ICD-10-CM

## 2014-06-19 DIAGNOSIS — IMO0002 Reserved for concepts with insufficient information to code with codable children: Secondary | ICD-10-CM

## 2014-06-19 DIAGNOSIS — IMO0001 Reserved for inherently not codable concepts without codable children: Secondary | ICD-10-CM

## 2014-06-19 DIAGNOSIS — G9601 Cranial cerebrospinal fluid leak, spontaneous: Secondary | ICD-10-CM

## 2014-06-19 DIAGNOSIS — G960 Cerebrospinal fluid leak: Secondary | ICD-10-CM

## 2014-06-19 DIAGNOSIS — R51 Headache: Secondary | ICD-10-CM

## 2014-06-19 LAB — POCT GLYCOSYLATED HEMOGLOBIN (HGB A1C): Hemoglobin A1C: 7.6

## 2014-06-19 MED ORDER — GABAPENTIN 400 MG PO CAPS: 400.0000 mg | ORAL_CAPSULE | Freq: Three times a day (TID) | ORAL | Status: DC

## 2014-06-19 MED ORDER — GLUCOSE BLOOD VI STRP: ORAL_STRIP | Status: DC

## 2014-06-19 MED ORDER — INSULIN PEN NEEDLE 29G X 12.7MM MISC: 1.0000 "application " | Freq: Three times a day (TID) | Status: DC

## 2014-06-19 MED ORDER — AGAMATRIX ULTRA-THIN LANCETS MISC: 1.0000 "application " | Freq: Three times a day (TID) | Status: DC

## 2014-06-19 MED ORDER — AGAMATRIX PRESTO W/DEVICE KIT: PACK | Status: DC

## 2014-06-19 NOTE — Assessment & Plan Note (Signed)
Currently without leakage, chronic h/o leakage 3 years ago, s/p ENT surgery, previously followed by [facility] Neurosurgery, did not complete surgery d/t self resolution - Concern for associated headaches  Plan: 1. Continue to monitor for leakage 2. Referral to local Neurology for Headaches 3. Consider follow-up with [facility] Neurosurgery if indicated, refractory headaches?

## 2014-06-19 NOTE — Assessment & Plan Note (Addendum)
Significantly improved control. Current HgbA1c 7.6 (06/19/14), last HgbA1c 12.0 (12/2013). Complications - peripheral neuropathy - Note allergy / intolerance to Metformin - f/u Ophtho  Plan:  1. Continue Lantus 100u daily, Novolog 15u TID wc 2. Rx new Glucometer, test strips, lancets 3. Record glucose readings, bring to next visit 4. Refill Gabapentin, consider future PA for Lyrica, alternative TCA 5. Lifestyle Mods - continue improved DM diet 6. Goal - Walking drive x [id number] at least 3x weekly 7. RTC 3 mo for A1c

## 2014-06-19 NOTE — Assessment & Plan Note (Signed)
Chronic refractory headaches, multiple locations, x 3 years seems associated with h/o CSF leakage. No significant associated symptoms, not consistent with tension / migraines - Previous imaging, concerns for chronic sinusitis - See history for CSF leak, s/p surgery  Plan: 1. Referral to local Neurology for [facility] Clinic evaluation, given refractory to treatments, and potential need for further referral to Neurosurgery in future given complicated h/o CSF leak

## 2014-06-19 NOTE — Progress Notes (Signed)
Subjective:     Patient ID: [name], female   DOB: [birth year], 58 y.o.   MRN: [id number]  Patient presents to establish care with new PCP.  HPI  CHRONIC DM, Type 2: Reports - broken glucometer CBGs: Avg 230, Low 210, High 260. Checks CBGs x1 daily Meds: Lantus 100u daily (if CBG >250, will take 20-30u in PM), Novolog 15u TID wc. Reports good compliance. Tolerating well w/o side-effects Currently on ARB Lifestyle: Diet (increased water, dec soda 1x 2-3 days) / Exercise (goal to walk drive [id number] daily 3 days weekly) Denies hypoglycemia, polyuria, visual changes, numbness or tingling. - States she needs to schedule Ophthalmology visit soon to get annual eye exam  HEADACHES: - Hx CSF leakage 2012, worse with forward flexion, referred to ENT, s/p surgery through nose, return leakage, then referred to [facility], never had surgery done - Currently reports daily headaches, "like a tootheache, constant throb, 7/10" similar on most days, no significant improvement and no worsening. Describes different locations, now posterior left, usually unilateral. 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 either)  HM: Mammogram - hx spot R breast, plans to schedule repeat Colonoscopy - not done, but reports family hx colon  Family Hx: Sister - needs liver transplant Mother - new dx Alzheimer's  I have reviewed and updated the following as appropriate: allergies and current medications  Social Hx: Active smoker  Review of Systems  See above HPI    Objective:   Physical Exam  BP 125/79  Pulse 69  Temp(Src) 97.7 F (36.5 C) (Oral)  Ht 5\' 2"  (1.575 m)  Wt 247 lb (112.038 kg)  BMI 45.17 kg/m2  Gen - obese, well-appearing,  pleasant, NAD HEENT - NCAT, PERRL, EOMI, patent nares w/o congestion or drainage, oropharynx clear, MMM Neck - supple, non-tender Heart - RRR, no murmurs heard Lungs - CTAB, no wheezing, crackles, or rhonchi. Normal work of breathing. Abd - soft, NTND, no masses, +active BS Ext - non-tender, no edema, peripheral pulses intact +2 b/l Neuro - awake, alert, oriented, grossly non-focal, intact muscle strength 5/5 b/l, intact distal sensation to light touch, gait normal  DM Foot Exam - bilaterally intact to monofilament, without ulcers      Assessment:     See specific A&P problem list for details.      Plan:     See specific A&P problem list for details.

## 2014-06-19 NOTE — Patient Instructions (Addendum)
Dear [name], Thank you for coming in to clinic today. It was good to meet you!  Today we discussed your Diabetes and Chronic Headaches. 1. For your Diabetes, it looks like your 3 month sugar level is much improved down from 12 to 7.6, which is excellent. 2. I recommend to continue your current regimen at this time, please bring in your glucometer with glucose readings to our next visit. 3. I will send in a new meter and testing supplies. Call if there is any concern with picking these up. 4. As we discussed, don't forget your goal:  - plan to walk the driveway 10 times at least 3 times a week  - Keep up the good work with increased water and decreased soda  - Eventually you will see weight improvement. It looks like you've worked hard to improve your sugar already 5. For your Headaches, I recommend referral to local Neurology, they will discuss alternative options for you. And if needed we can get you back to the see ENT or Neurosurgery [facility])  Some important numbers from today's visit: HgA1C - 7.6 BP - 152/79  REMINDERS: - Schedule Mammogram appointment - Strongly consider Colonoscopy for colon cancer screening  Please schedule a follow-up appointment with me in 3 months for Diabetes Follow-up.  If you have any other questions or concerns, please feel free to call the clinic to contact me. You may also schedule an earlier appointment if necessary.  However, if your symptoms get significantly worse, please go to the Emergency Department to seek immediate medical attention.  Nobie Putnam, [place]

## 2014-06-20 ENCOUNTER — Telehealth: Payer: Self-pay | Admitting: *Deleted

## 2014-06-20 NOTE — Progress Notes (Signed)
Opened in error

## 2014-06-20 NOTE — Telephone Encounter (Signed)
Lennette Bihari, Pharmacist with [facility] left voice message stating that he needed someone enrolled with medicare to write new Rx for pt's diabetic supplies and diagnosis code needed to on each Rx (test strips, lancets and meter).  Derl Barrow, RN

## 2014-06-22 ENCOUNTER — Other Ambulatory Visit: Payer: Self-pay | Admitting: Family Medicine

## 2014-06-22 DIAGNOSIS — IMO0002 Reserved for concepts with insufficient information to code with codable children: Secondary | ICD-10-CM

## 2014-06-22 DIAGNOSIS — E1165 Type 2 diabetes mellitus with hyperglycemia: Secondary | ICD-10-CM

## 2014-06-22 MED ORDER — AGAMATRIX ULTRA-THIN LANCETS MISC: Status: DC

## 2014-06-22 MED ORDER — AGAMATRIX PRESTO W/DEVICE KIT: PACK | Status: DC

## 2014-06-22 MED ORDER — GLUCOSE BLOOD VI STRP: ORAL_STRIP | Status: DC

## 2014-06-22 NOTE — Telephone Encounter (Signed)
Discussed with Dr. Ree Kida, who re-ordered DM testing supplies (glucometer, lancets, and test strips) sent to [facility] pharmacy.  Nobie Putnam, [place], PGY-2

## 2014-08-22 ENCOUNTER — Telehealth: Payer: Self-pay | Admitting: Family Medicine

## 2014-08-22 NOTE — Telephone Encounter (Signed)
Needs referral to toe  dr Dr Harriet Masson  [facility]  [phone]

## 2014-08-24 NOTE — Telephone Encounter (Signed)
Called patient, spoke with [name] to request more information regarding her referral request. She states that Left big toe and second toe have had "ingrown toenails" for about 1 year, but only recently have been causing pain for about 2 months. She had called Dr. Erasmo Downer office and they had requested a referral.  I advised the patient to schedule a follow-up appointment with me at [facility] for evaluation of her toes in person, and discussing potential options, including referral to [facility] if needed. She understood, plans to schedule appointment.  Nobie Putnam, [place], PGY-2

## 2014-08-31 ENCOUNTER — Ambulatory Visit: Payer: Medicare Other | Admitting: Family Medicine

## 2014-09-02 DIAGNOSIS — L600 Ingrowing nail: Secondary | ICD-10-CM | POA: Insufficient documentation

## 2014-09-06 ENCOUNTER — Ambulatory Visit: Payer: Self-pay

## 2014-09-12 ENCOUNTER — Other Ambulatory Visit: Payer: Self-pay

## 2014-09-12 DIAGNOSIS — Z1231 Encounter for screening mammogram for malignant neoplasm of breast: Secondary | ICD-10-CM

## 2014-09-19 ENCOUNTER — Ambulatory Visit: Payer: Self-pay

## 2014-09-26 ENCOUNTER — Ambulatory Visit
Admission: RE | Admit: 2014-09-26 | Discharge: 2014-09-26 | Disposition: A | Discharge status: Home / Self Care | Payer: Medicare Other | Source: Ambulatory Visit

## 2014-09-26 DIAGNOSIS — Z1231 Encounter for screening mammogram for malignant neoplasm of breast: Secondary | ICD-10-CM

## 2014-09-28 DIAGNOSIS — M25561 Pain in right knee: Secondary | ICD-10-CM | POA: Insufficient documentation

## 2014-09-28 DIAGNOSIS — M25562 Pain in left knee: Secondary | ICD-10-CM | POA: Insufficient documentation

## 2014-10-03 ENCOUNTER — Ambulatory Visit: Payer: Self-pay

## 2014-10-17 ENCOUNTER — Other Ambulatory Visit: Payer: Self-pay | Admitting: Pulmonary Disease

## 2014-10-17 ENCOUNTER — Encounter: Payer: Self-pay | Admitting: Pulmonary Disease

## 2014-10-17 ENCOUNTER — Ambulatory Visit (INDEPENDENT_AMBULATORY_CARE_PROVIDER_SITE_OTHER): Payer: Medicare Other | Admitting: Pulmonary Disease

## 2014-10-17 VITALS — BP 132/74 | HR 87 | Temp 98.4°F | Ht 62.0 in | Wt 257.8 lb

## 2014-10-17 DIAGNOSIS — G4733 Obstructive sleep apnea (adult) (pediatric): Secondary | ICD-10-CM

## 2014-10-17 NOTE — Progress Notes (Signed)
Subjective:    Patient ID: [name], female    DOB: [birth year], 57 y.o.   MRN: [id number]  HPI  The patient is a 58 year old female who I've been asked to see for possible obstructive sleep apnea. She has been noted to have loud snoring, as well as an abnormal breathing pattern during sleep. She also admits to having frequent choking arousals. She has frequent awakenings at night, and is not rested in the mornings upon arising. She notes significant daytime sleepiness with periods of inactivity, and her family states that she will fall asleep anytime she sits still. She also falls asleep in the evening watching television or movies. She has no issues driving short distances, and never drives long distances.  The patient's weight is up 15 pounds over the last 2 years, and her Epworth score today is 3.   Sleep Questionnaire What time do you typically go to bed?( Between what hours) 9-11PM 9-11PM at 1557 on 10/17/14 by Inge Rise, CMA How long does it take you to fall asleep? right away right away at 1557 on 10/17/14 by Inge Rise, CMA How many times during the night do you wake up? 3 3 at 1557 on 10/17/14 by Inge Rise, [place] What time do you get out of bed to start your day? 0900 0900 at 1557 on 10/17/14 by Inge Rise, CMA Do you drive or operate heavy machinery in your occupation? No No at 1557 on 10/17/14 by Inge Rise, CMA How much has your weight changed (up or down) over the past two years? (In pounds) 15 lb (6.804 kg) 15 lb (6.804 kg) at 1557 on 10/17/14 by Inge Rise, CMA Have you ever had a sleep study before? No No at 1557 on 10/17/14 by Inge Rise, CMA Do you currently use CPAP? No No at 1557 on 10/17/14 by Inge Rise, CMA Do you wear oxygen at any time? No No at 1557 on 10/17/14 by Inge Rise, CMA   Review of Systems  Constitutional: Negative for fever and unexpected weight change.  HENT: Negative for congestion, dental problem,  ear pain, nosebleeds, postnasal drip, rhinorrhea, sinus pressure, sneezing, sore throat and trouble swallowing.   Eyes: Negative for redness and itching.  Respiratory: Positive for shortness of breath. Negative for cough, chest tightness and wheezing.   Cardiovascular: Negative for palpitations and leg swelling.  Gastrointestinal: Negative for nausea and vomiting.  Genitourinary: Negative for dysuria.  Musculoskeletal: Negative for joint swelling.  Skin: Negative for rash.  Neurological: Negative for headaches.  Hematological: Does not bruise/bleed easily.  Psychiatric/Behavioral: Negative for dysphoric mood. The patient is not nervous/anxious.        Objective:   Physical Exam Constitutional:  Obese female, no acute distress  HENT:  Nares patent without discharge  Oropharynx without exudate, palate and uvula are thick and elongated, small posterior pharynx  Eyes:  Perrla, eomi, no scleral icterus  Neck:  No JVD, no TMG  Cardiovascular:  Normal rate, regular rhythm, no rubs or gallops.  1/6 sem        Intact distal pulses  Pulmonary :  Normal breath sounds, no stridor or respiratory distress   No rales, rhonchi, or wheezing  Abdominal:  Soft, nondistended, bowel sounds present.  No tenderness noted.   Musculoskeletal:  mild lower extremity edema noted.  Lymph Nodes:  No cervical lymphadenopathy noted  Skin:  No cyanosis noted  Neurologic:  Appears sleepy, but appropriate, moves all  4 extremities without obvious deficit.         Assessment & Plan:

## 2014-10-17 NOTE — Assessment & Plan Note (Signed)
The patient's history is very suggestive of clinically significant sleep disordered breathing.  I have had a long discussion with her about the pathophysiology of sleep apnea, including its impact to her quality of life and cardiovascular health. She will obviously need a sleep study for diagnosis, and I think she is an excellent candidate for home sleep testing. The patient is agreeable to this approach.

## 2014-10-17 NOTE — Patient Instructions (Signed)
Will schedule for home sleep testing to evaluate you for sleep apnea.  Will arrange followup once the results are available.  Work on weight loss.

## 2015-01-03 ENCOUNTER — Encounter (HOSPITAL_BASED_OUTPATIENT_CLINIC_OR_DEPARTMENT_OTHER): Payer: Medicare Other

## 2015-01-04 ENCOUNTER — Ambulatory Visit (HOSPITAL_BASED_OUTPATIENT_CLINIC_OR_DEPARTMENT_OTHER): Payer: Medicare Other | Attending: Pulmonary Disease

## 2015-01-04 DIAGNOSIS — G4733 Obstructive sleep apnea (adult) (pediatric): Secondary | ICD-10-CM | POA: Diagnosis present

## 2015-01-16 ENCOUNTER — Ambulatory Visit (INDEPENDENT_AMBULATORY_CARE_PROVIDER_SITE_OTHER): Payer: Medicare Other

## 2015-01-16 VITALS — BP 164/89 | HR 90 | Resp 12

## 2015-01-16 DIAGNOSIS — M79676 Pain in unspecified toe(s): Secondary | ICD-10-CM

## 2015-01-16 DIAGNOSIS — B351 Tinea unguium: Secondary | ICD-10-CM | POA: Diagnosis not present

## 2015-01-16 DIAGNOSIS — E1140 Type 2 diabetes mellitus with diabetic neuropathy, unspecified: Secondary | ICD-10-CM

## 2015-01-16 DIAGNOSIS — L03012 Cellulitis of left finger: Secondary | ICD-10-CM | POA: Diagnosis not present

## 2015-01-16 DIAGNOSIS — L600 Ingrowing nail: Secondary | ICD-10-CM

## 2015-01-16 MED ORDER — CEPHALEXIN 500 MG PO CAPS: 500.0000 mg | ORAL_CAPSULE | Freq: Three times a day (TID) | ORAL | Status: DC

## 2015-01-16 NOTE — Patient Instructions (Signed)
Betadine Soak Instructions  Purchase an 8 oz. bottle of BETADINE solution (Povidone)  THE DAY AFTER THE PROCEDURE  Place 1 tablespoon of betadine solution in a quart of warm tap water.  Submerge your foot or feet with outer bandage intact for the initial soak; this will allow the bandage to become moist and wet for easy lift off.  Once you remove your bandage, continue to soak in the solution for 20 minutes.  This soak should be done twice a day.  Next, remove your foot or feet from solution, blot dry the affected area and cover.  You may use a band aid large enough to cover the area or use gauze and tape.  Apply other medications to the area as directed by the doctor such as cortisporin otic solution (ear drops) or neosporin.  IF YOUR SKIN BECOMES IRRITATED WHILE USING THESE INSTRUCTIONS, IT IS OKAY TO SWITCH TO EPSOM SALTS AND WATER OR WHITE VINEGAR AND WATER.  Recommended for postoperative pain 2 Tylenol or 2 Advil 3 times daily as needed for pain

## 2015-01-16 NOTE — Progress Notes (Signed)
   Subjective:    Patient ID: FABIENNE DOMBROSKI, female    DOB: 12/18/1955, 59 y.o.   MRN: [id number]  HPI  PT STATED LT FOOT GREAT TOENAIL IS CURVING IN AND BEEN HURTING FOR 6 MONTHS. THE TOENAIL IS GETTING WORSE AND GET AGGRAVATED BY WEARING SHOES. TRIED TO TRIM BUT IS TO HARD.  ALSO, TOENAILS TRIM. Review of Systems  Musculoskeletal: Positive for myalgias, back pain, joint swelling and neck stiffness.  Skin: Positive for color change.  Neurological: Positive for numbness.       Objective:   Physical Exam Vascular status is intact pedal pulses palpable DP +2 over 4 PT 1 over 4 bilateral capillary refill time 3 seconds all digits mild varicosities noted neurologically epicritic and proprioceptive sensations diminished on Semmes Weinstein to the forefoot digits and arch bilateral. There is normal plantar response DTRs not listed dermatologically skin color pigment normal hair growth absent nails thick brittle crumbly friable discolored yellowed. The right hallux most severe there is severe keratosis and incurvation of the left hallux painful and tender on palpation and inflow shoewear. Orthopedic exam reveals rectus foot type mild digital contractures mild bunion deformity noted there is no open wounds no ulcers no secondary infections. There is keratosis pain discomfort on medial lateral borders of the left hallux with severe incurvation of the nail being noted early paronychia noted.       Assessment & Plan:  Assessment this time is diabetes history peripheral neuropathy mild angiopathy. Mycotic nails brittle [place] dystrophic 1 through 5 bilateral reaction 1 through 5 right 2 through 5 left. The left hallux has pain discomfort deformity with dystrophy of nail the nail plate is avulsed utilizing phenol matricectomy followed by alcohol wash Betadine and dry sterile dressing reapplied procedure was done under local anesthetic block patient be recheck in 2-3 weeks for nail check on the left  hallux at that time will possibly also initiate topical antifungal therapies for the remaining nails. He for pain patient tolerated the nail excision with phenol matricectomy without difficulties at this time maintain dressings for 24 hours as instructed start soaking tomorrow. Reappointed in 3 weeks for follow-up and nail check  Harriet Masson DPM

## 2015-01-18 ENCOUNTER — Ambulatory Visit (HOSPITAL_BASED_OUTPATIENT_CLINIC_OR_DEPARTMENT_OTHER): Payer: Medicare Other | Admitting: Pulmonary Disease

## 2015-01-18 DIAGNOSIS — G4733 Obstructive sleep apnea (adult) (pediatric): Secondary | ICD-10-CM

## 2015-01-18 NOTE — Sleep Study (Signed)
   NAME: [name] DATE OF BIRTH:  [birth year] MEDICAL RECORD NUMBER [id number]  LOCATION: [facility]  PHYSICIAN: [place] OF STUDY: 01/04/2015  SLEEP STUDY TYPE: Nocturnal Polysomnogram               REFERRING PHYSICIAN: Clance, Armando Reichert, MD  INDICATION FOR STUDY: Hypersomnia with sleep apnea  EPWORTH SLEEPINESS SCORE:  6 HEIGHT:    WEIGHT:      There is no weight on file to calculate BMI.  NECK SIZE: 17 in.  MEDICATIONS: Reviewed in the sleep record  SLEEP ARCHITECTURE: The patient had a total sleep time of 223 minutes with no slow-wave sleep and only 26 minutes of REM. Sleep onset latency was prolonged at 72 minutes, and REM onset was prolonged at 250 minutes. Sleep efficiency is significantly reduced at 61%.  RESPIRATORY DATA: The patient was found to have 188 apneas and 170 hypopneas, giving her an AHI of 96 events per hour. The events occurred in all body positions, and there was loud snoring noted throughout.  OXYGEN DATA: There was transient oxygen desaturation as low as 57%  CARDIAC DATA: Occasional PVC noted  MOVEMENT/PARASOMNIA: No significant periodic limb movements or other abnormal behaviors were seen.  IMPRESSION/ RECOMMENDATION:    1) very severe obstructive sleep apnea/hypopnea syndrome, with an AHI of 96 events per hour and oxygen desaturation as low as 57%. Treatment for this degree of sleep apnea should focus on a trial of C Pap, as well as aggressive weight loss.  2) occasional PVC noted, but no clinically significant arrhythmias were seen.    [place], American Board of Sleep Medicine  ELECTRONICALLY SIGNED ON:  01/18/2015, 8:48 AM [place] PH: [phone]   FX: [phone] [place]

## 2015-01-18 NOTE — Progress Notes (Signed)
Pt needs ov to review sleep study 

## 2015-01-19 ENCOUNTER — Telehealth: Payer: Self-pay | Admitting: *Deleted

## 2015-01-19 NOTE — Progress Notes (Signed)
[id number] x1 

## 2015-01-19 NOTE — Telephone Encounter (Signed)
Pt states she had entire toenail removed on 01/16/2015, the area continues to ooze.  I informed pt the area will act like a burn, without blistering and will ooze, weep and sting for about 4 - 6 weeks to varying degrees.  I encouraged her to call if symptoms worsened or signs of infections appeared.  Pt agreed.

## 2015-01-23 NOTE — Progress Notes (Signed)
Called pt and appt scheduled for 2/19

## 2015-01-26 ENCOUNTER — Ambulatory Visit: Payer: Medicare Other | Admitting: Pulmonary Disease

## 2015-01-30 ENCOUNTER — Ambulatory Visit (INDEPENDENT_AMBULATORY_CARE_PROVIDER_SITE_OTHER): Payer: Medicare Other

## 2015-01-30 VITALS — BP 132/79 | HR 78 | Resp 12

## 2015-01-30 DIAGNOSIS — Z09 Encounter for follow-up examination after completed treatment for conditions other than malignant neoplasm: Secondary | ICD-10-CM

## 2015-01-30 DIAGNOSIS — L03012 Cellulitis of left finger: Secondary | ICD-10-CM

## 2015-01-30 DIAGNOSIS — L600 Ingrowing nail: Secondary | ICD-10-CM

## 2015-01-30 NOTE — Patient Instructions (Signed)

## 2015-01-30 NOTE — Progress Notes (Signed)
   Subjective:    Patient ID: [name], female    DOB: 18-Apr-1956, 59 y.o.   MRN: [id number]  HPI  ''LT FOOT GREAT TOENAIL IS STILL SORE.''  Review of Systems no new findings or systemic changes noted     Objective:   Physical Exam Neurovascular status unchanged pedal pulses palpable patient is having some erythema proximal nail fold consistent with phenol burn patient is also having some paresthesia in shooting sensation in overall diabetic neuropathy versus again the phenol burn chemical burn. There is no purulence no malodor no increased temperature good eschar tissue formation over the entire nail bed no purulence noted no ascending psoas lymphangitis.       Assessment & Plan:  Assessment good postop progress following AP nail procedure of the left great toe. At this time continue with cleansing soap and water maintain Neosporin and Band-Aid dressings are noted today patient still has antibiotic left apparently had not used adequately appropriate advised to complete her anabolic regimen as instructed recheck in 3-4 weeks for long-term follow-up contact us immediately if is any changes exacerbations any fever chills etc. Due to her diabetes and neuropathy issues and she may continue have some slight paresthesia or burning in her recheck as needed  Harriet Masson DPM

## 2015-02-07 ENCOUNTER — Ambulatory Visit (INDEPENDENT_AMBULATORY_CARE_PROVIDER_SITE_OTHER): Payer: Medicare Other | Admitting: Pulmonary Disease

## 2015-02-07 ENCOUNTER — Encounter: Payer: Self-pay | Admitting: Pulmonary Disease

## 2015-02-07 ENCOUNTER — Other Ambulatory Visit: Payer: Self-pay | Admitting: Pulmonary Disease

## 2015-02-07 VITALS — BP 126/72 | HR 83 | Temp 97.0°F | Ht 62.5 in | Wt 252.8 lb

## 2015-02-07 DIAGNOSIS — G4733 Obstructive sleep apnea (adult) (pediatric): Secondary | ICD-10-CM

## 2015-02-07 NOTE — Assessment & Plan Note (Signed)
The patient has very severe obstructive sleep apnea by her recent sleep study, and will obviously need to be started on C Pap while she is working on weight loss. She is agreeable to this approach. I will set the patient up on cpap at a moderate pressure level to allow for desensitization, and will troubleshoot the device over the next 4-6weeks if needed.  The pt is to call me if having issues with tolerance.  Will then optimize the pressure once patient is able to wear cpap on a consistent basis.

## 2015-02-07 NOTE — Patient Instructions (Signed)
Will start on cpap at moderate pressure level.  Please call if having tolerance issues. Work on weight loss followup with me again in 8 weeks.

## 2015-02-07 NOTE — Progress Notes (Signed)
   Subjective:    Patient ID: CAYDANCE WRITER, female    DOB: 01/14/1956, 59 y.o.   MRN: [id number]  HPI Patient comes in today for follow-up of her recent sleep study. She was found to have very severe obstructive sleep apnea, with an AHI of 96 events per hour. I have reviewed the study with her in detail, and answered all of her questions.   Review of Systems  Constitutional: Negative for fever and unexpected weight change.  HENT: Negative for congestion, dental problem, ear pain, nosebleeds, postnasal drip, rhinorrhea, sinus pressure, sneezing, sore throat and trouble swallowing.   Eyes: Negative for redness and itching.  Respiratory: Negative for cough, chest tightness, shortness of breath and wheezing.   Cardiovascular: Negative for palpitations and leg swelling.  Gastrointestinal: Negative for nausea and vomiting.  Genitourinary: Negative for dysuria.  Musculoskeletal: Negative for joint swelling.  Skin: Negative for rash.  Neurological: Negative for headaches.  Hematological: Does not bruise/bleed easily.  Psychiatric/Behavioral: Negative for dysphoric mood. The patient is not nervous/anxious.        Objective:   Physical Exam Morbidly obese female in no acute distress Nose without purulence or discharge noted Neck without lymphadenopathy or thyromegaly Lower extremities with edema noted, no cyanosis Alert and oriented, moves all 4 extremities.       Assessment & Plan:

## 2015-02-14 ENCOUNTER — Ambulatory Visit (INDEPENDENT_AMBULATORY_CARE_PROVIDER_SITE_OTHER): Payer: Medicare Other

## 2015-02-14 ENCOUNTER — Encounter: Payer: Self-pay | Admitting: Podiatry

## 2015-02-14 ENCOUNTER — Ambulatory Visit (INDEPENDENT_AMBULATORY_CARE_PROVIDER_SITE_OTHER): Payer: Medicare Other | Admitting: Podiatry

## 2015-02-14 VITALS — BP 150/76 | HR 81 | Temp 98.1°F | Resp 18

## 2015-02-14 DIAGNOSIS — L02619 Cutaneous abscess of unspecified foot: Secondary | ICD-10-CM | POA: Diagnosis not present

## 2015-02-14 DIAGNOSIS — L03119 Cellulitis of unspecified part of limb: Secondary | ICD-10-CM | POA: Diagnosis not present

## 2015-02-14 LAB — CBC WITH DIFFERENTIAL/PLATELET
Basophils Absolute: 0 10*3/uL (ref 0.0–0.1)
Basophils Relative: 0 % (ref 0–1)
Eosinophils Absolute: 0.3 10*3/uL (ref 0.0–0.7)
Eosinophils Relative: 3 % (ref 0–5)
HCT: 46.6 % — ABNORMAL HIGH (ref 36.0–46.0)
Hemoglobin: 15.5 g/dL — ABNORMAL HIGH (ref 12.0–15.0)
Lymphocytes Relative: 26 % (ref 12–46)
Lymphs Abs: 2.6 10*3/uL (ref 0.7–4.0)
MCH: 29.6 pg (ref 26.0–34.0)
MCHC: 33.3 g/dL (ref 30.0–36.0)
MCV: 89.1 fL (ref 78.0–100.0)
MPV: 9.7 fL (ref 8.6–12.4)
Monocytes Absolute: 0.4 10*3/uL (ref 0.1–1.0)
Monocytes Relative: 4 % (ref 3–12)
Neutro Abs: 6.8 10*3/uL (ref 1.7–7.7)
Neutrophils Relative %: 67 % (ref 43–77)
Platelets: 220 10*3/uL (ref 150–400)
RBC: 5.23 MIL/uL — ABNORMAL HIGH (ref 3.87–5.11)
RDW: 14.6 % (ref 11.5–15.5)
WBC: 10.1 10*3/uL (ref 4.0–10.5)

## 2015-02-14 LAB — URIC ACID: Uric Acid, Serum: 5.5 mg/dL (ref 2.4–7.0)

## 2015-02-14 LAB — C-REACTIVE PROTEIN: CRP: 2.2 mg/dL — ABNORMAL HIGH (ref <0.60)

## 2015-02-14 MED ORDER — DOXYCYCLINE HYCLATE 100 MG PO TABS: 100.0000 mg | ORAL_TABLET | Freq: Two times a day (BID) | ORAL | Status: DC

## 2015-02-14 MED ORDER — SILVER SULFADIAZINE 1 % EX CREA: 1.0000 "application " | TOPICAL_CREAM | Freq: Every day | CUTANEOUS | Status: DC

## 2015-02-15 ENCOUNTER — Encounter: Payer: Self-pay | Admitting: Podiatry

## 2015-02-15 LAB — SEDIMENTATION RATE: Sed Rate: 12 mm/hr (ref 0–30)

## 2015-02-15 NOTE — Progress Notes (Signed)
Patient ID: [name], female   DOB: 1956-11-24, 59 y.o.   MRN: [id number]  Subjective: 59 year old female presents the office today with complaints of possible infection to left big toe. She states that she previously had the nail removed last month. She states that over the last couple days used to increase swelling, pain and some purulence from around the nailbed on the left big toe. She has been soaking in Epson salts and covering with a Band-Aid. She denies any systemic complaints as fevers, chills, nausea, vomiting. No other complaints at this time in no acute changes since last appointment.  Objective: AAO 3, NAD DP/PT pulses palpable, CRT less than 3 seconds Decreased protective sensation with Simms Weinstein monofilament, Achilles tendon reflex intact There is a scab overlying the left nail bed of the hallux. There is a small blister on the proximal nail fold. There is edema to the left medial foot and there is faint erythema overlying the hallux extending just proximal to the MPJ. There is mild increase in warmth overlying the area. There is no pain with MTPJ or IPJ range of motion. Upon debridement of the scab there was superficial purulence expressed between the scab and the underlying skin. After removal of the scab the underlying nail bed had a granular wound base. There is no probing or undermining or any tunneling. There is no further purulence expressed. There is no surrounding fluctuance or crepitus. There is no ascending cellulitis. No malodor. No other areas of tenderness to bilateral lower extremities. No pain with calf compression, swelling, warmth, erythema.  Assessment: 59 year old female left hallux infection, cellulitis  Plan: -X-rays were obtained and reviewed the patient. -Treatment options were discussed the patient including alternatives, risks, complications. -Prescribed doxycycline. -Silvadene was applied followed by dry sterile dressing. Recommended the  patient to continue this on a daily basis. -She was concerned that she may have gout although this does appear to be infection. Ordered CBC, ESR, CRP, uric acid. -Follow-up in 1 week or sooner if any problems are to arise. Discussed the patient that if there is any worsening signs or symptoms of infection to call the office immediately or go directly to the emergency room. Encouraged to call the office with any questions, concerns, change in symptoms.

## 2015-02-21 ENCOUNTER — Encounter: Payer: Self-pay | Admitting: Podiatry

## 2015-02-21 ENCOUNTER — Ambulatory Visit (INDEPENDENT_AMBULATORY_CARE_PROVIDER_SITE_OTHER): Payer: Medicare Other | Admitting: Podiatry

## 2015-02-21 VITALS — BP 152/73 | HR 90 | Resp 18

## 2015-02-21 DIAGNOSIS — L03119 Cellulitis of unspecified part of limb: Secondary | ICD-10-CM

## 2015-02-21 DIAGNOSIS — L02619 Cutaneous abscess of unspecified foot: Secondary | ICD-10-CM | POA: Diagnosis not present

## 2015-02-21 NOTE — Patient Instructions (Signed)
Monitor for any signs/symptoms of infection. Call the office immediately if any occur or go directly to the emergency room. Call with any questions/concerns. Finish course of antibiotics

## 2015-02-22 NOTE — Progress Notes (Signed)
Patient ID: [name], female   DOB: 24-Jul-1956, 58 y.o.   MRN: [id number]  Subjective: 59 year old female returns the office they for follow-up evaluation of left foot cellulitis, left hallux infection. She states that since last appointment the pain and swelling and redness has significantly improved. She has been soaking the foot in Epson salts covering with Silvadene and a dry sterile dressing. She denies any systemic complaints as fevers, chills, nausea, vomiting. She also states her blood sugars typically is between 200-235.  Objective: AAO 3, NAD  DP/PT pulses palpable, CRT less than 3 seconds Decreased protective sensation with Simms Weinstein monofilament Overlying the left hallux nail bed there is fibro-granular tissue with slightly macerated skin around the nailbed. There is significant improvement in the erythema compared to prior appointment. There is a slight rim of erythema around the hallux border all others no ascending cellulitis, fluctuance, crepitus, malodor. There is a small modest serous drainage from the nailbed. There is mild tenderness to palpation to the over the nailbed. No other areas of tenderness to bilateral lower extremities. No pain with calf compression, swelling, warmth, erythema.  Assessment: 59 year old female hallux infection with resolving cellulitis  Plan: -Treatment options discussed included alternatives, risks, complications. -Finish course of doxycycline. -Recommended continue soaking Epson salts followed by Silvadene and a dry sterile dressing. She can certainly the area uncovered in order to help a dry. -Follow up in 2 weeks or sooner if any palms are to arise. In the meantime occurs call the office with any questions, concerns, change in symptoms. Also monitor closely for any signs or symptoms of worsening infection and directed to call the office immediately should any occur or go to the ER.

## 2015-02-27 ENCOUNTER — Ambulatory Visit: Payer: Medicare Other

## 2015-03-07 ENCOUNTER — Encounter: Payer: Self-pay | Admitting: Podiatry

## 2015-03-07 ENCOUNTER — Ambulatory Visit (INDEPENDENT_AMBULATORY_CARE_PROVIDER_SITE_OTHER): Payer: Medicare Other | Admitting: Podiatry

## 2015-03-07 VITALS — BP 140/74 | HR 79 | Resp 18

## 2015-03-07 DIAGNOSIS — L03032 Cellulitis of left toe: Secondary | ICD-10-CM

## 2015-03-07 DIAGNOSIS — M79676 Pain in unspecified toe(s): Secondary | ICD-10-CM

## 2015-03-07 MED ORDER — CEPHALEXIN 500 MG PO CAPS: 500.0000 mg | ORAL_CAPSULE | Freq: Three times a day (TID) | ORAL | Status: DC

## 2015-03-07 NOTE — Progress Notes (Signed)
Patient ID: [name], female   DOB: 08-30-1956, 59 y.o.   MRN: [id number]  Subjective: [name] returns the office they for follow-up evaluation of left foot cellulitis, left hallux infection. She states that she continues to note the foot twice a day Epson salts covering with antibiotic ointment and a Band-Aid. She has no soap small amount of clear drainage coming from the procedure site. She states of the redness is significantly improved compared to what it was prior although she does continue a small amount of redness along the proximal nail border. She denies any frank purulence. She denies any red streaking. She denies any systemic complaints as fevers, chills, nausea, vomiting.   Objective: AAO 3, NAD  DP/PT pulses palpable, CRT less than 3 seconds Decreased protective sensation with Simms Weinstein monofilament Overlying the left hallux nail bed there is scab formation on the nailbed. There is continued improvement in the erythema compared to prior appointments. There is a mild rim of erythema around the proximal hallux border without any ascending cellulitis, fluctuance, crepitus, malodor. There is a small amount of serous drainage from the nailbed, no frank  purulence. There is mild tenderness to palpation to the over the nailbed. No other areas of tenderness to bilateral lower extremities. No pain with calf compression, swelling, warmth, erythema.  Assessment: 59 year old female hallux infection with resolving cellulitis  Plan: -Treatment options discussed included alternatives, risks, complications. -Left hallux nail bed debrided. -Due to mild continued redness recommend he continue anabolic speared we'll switch back to Keflex at this time. Continue soaking in Epson salt soaks twice a day followed by anabolic ointment and a Band-Aid. -Follow up in 2 weeks or sooner if any problems are to arise. In the meantime occurs call the office with any questions, concerns, change in  symptoms. Also monitor closely for any signs or symptoms of worsening infection and directed to call the office immediately should any occur or go to the ER.

## 2015-03-21 ENCOUNTER — Ambulatory Visit: Payer: Medicare Other | Admitting: Podiatry

## 2015-03-27 ENCOUNTER — Ambulatory Visit: Payer: Medicare Other | Admitting: Podiatry

## 2015-04-04 ENCOUNTER — Ambulatory Visit (INDEPENDENT_AMBULATORY_CARE_PROVIDER_SITE_OTHER): Payer: Medicare Other | Admitting: Podiatry

## 2015-04-04 ENCOUNTER — Encounter: Payer: Self-pay | Admitting: Podiatry

## 2015-04-04 ENCOUNTER — Ambulatory Visit: Payer: Medicare Other | Admitting: Pulmonary Disease

## 2015-04-04 VITALS — BP 143/70 | HR 85 | Resp 18

## 2015-04-04 DIAGNOSIS — L03032 Cellulitis of left toe: Secondary | ICD-10-CM | POA: Diagnosis not present

## 2015-04-08 NOTE — Progress Notes (Signed)
Patient ID: MAYLEEN POWDERLY, female   DOB: 1956-05-06, 59 y.o.   MRN: [id number]  Subjective: Ms. Margheim returns the office they for follow-up evaluation of left foot cellulitis, left hallux infection. She states that she continues to soak in Epson salts covering with antibiotic ointment and a Band-Aid. She states the redness and swelling is very much improved although it does remain somewhat red directly around the nail site on the left big toe. She denies any drainage/purulence from the nail site. She denies any red streaking. She denies any systemic complaints as fevers, chills, nausea, vomiting.   Objective: AAO 3, NAD  DP/PT pulses palpable, CRT less than 3 seconds Decreased protective sensation with Simms Weinstein monofilament Overlying the left hallux nail bed there is scab formation on the nailbed.There is a mild rim of erythema around the proximal hallux border without any ascending cellulitis, fluctuance, crepitus, malodor. There is no drainage from the nailbed, no frank  purulence. There is mild tenderness to palpation to the over the nailbed. No other areas of tenderness to bilateral lower extremities. No pain with calf compression, swelling, warmth, erythema.  Assessment: 59 year old female hallux infection with resolving cellulitis  Plan: -Treatment options discussed included alternatives, risks, complications. -Left hallux nail bed debrided. - Continue soaking in Epson salt soaks twice a day followed by antibiotic ointment and a Band-Aid. Also discussed she can wash the toe with antibiotic soak to help remove some of the loose scab.  -Will observe off antibiotics. If there is any worsening, directed to call the office immediately.  -Follow up in 2 weeks or sooner if any problems are to arise. In the meantime occurs call the office with any questions, concerns, change in symptoms. Also monitor closely for any signs or symptoms of worsening infection and directed to call the  office immediately should any occur or go to the ER. *x-ray next appt.

## 2015-04-20 ENCOUNTER — Ambulatory Visit: Payer: Medicare Other | Admitting: Podiatry

## 2015-04-25 ENCOUNTER — Ambulatory Visit: Payer: Medicare Other | Admitting: Pulmonary Disease

## 2015-04-27 ENCOUNTER — Ambulatory Visit (INDEPENDENT_AMBULATORY_CARE_PROVIDER_SITE_OTHER): Payer: Medicare Other | Admitting: Podiatry

## 2015-04-27 ENCOUNTER — Ambulatory Visit (INDEPENDENT_AMBULATORY_CARE_PROVIDER_SITE_OTHER): Payer: Medicare Other

## 2015-04-27 DIAGNOSIS — M79676 Pain in unspecified toe(s): Secondary | ICD-10-CM | POA: Diagnosis not present

## 2015-04-27 DIAGNOSIS — R52 Pain, unspecified: Secondary | ICD-10-CM

## 2015-04-27 DIAGNOSIS — B351 Tinea unguium: Secondary | ICD-10-CM

## 2015-05-01 NOTE — Progress Notes (Signed)
Patient ID: [name], female   DOB: [birth year], 59 y.o.    MRN: [id number]  Subjective: 59 y.o. -year-old female returns the office today for painful, elongated, thickened toenails and for follow-up evaluation of left hallux toenail removal site. Denies any redness or drainage around the nails. She states of the redness around the left big toenail site has significantly improved and she denies any drainage or purulence. She denies any red streaks she denies any pain associated with the area. Denies any acute changes since last appointment and no new complaints today. Denies any systemic complaints such as fevers, chills, nausea, vomiting.   Objective: AAO 3, NAD DP/PT pulses palpable, CRT less than 3 seconds Protective sensation decreased with Simms Weinstein monofilament, Achilles tendon reflex intact.  Nails hypertrophic, dystrophic, elongated, brittle, discolored 9. There is tenderness overlying the nails 2-5 on the left and 1-5 on the right. There is no surrounding erythema or drainage along the nail sites. Status post left hallux total removal of the scab overlying the area. There is no significant erythema around the nailbed side however it appears to be similar to all the other toenails. There is no ascending cellulitis, edema, drainage, malodor, fluctuance, crepitus. No open lesions or pre-ulcerative lesions are identified. No other areas of tenderness bilateral lower extremities. No overlying edema, erythema, increased warmth. No pain with calf compression, swelling, warmth, erythema.  Assessment: Patient presents with symptomatic onychomycosis; follow-up evaluation of left hallux nail removal site.  Plan: -X-rays left foot were obtained. There is no cortical changes since just osteomyelitis of the hallux at this time. -Treatment options including alternatives, risks, complications were discussed -Nails sharply debrided 9 without complication/bleeding. -Continue to monitor for  signs or symptoms of infection to the left hallux toenail site. -Discussed daily foot inspection. If there are any changes, to call the office immediately.  -Follow-up in 3 weeks or sooner if any problems are to arise to recheck the left big toe at the patient's request. In the meantime, encouraged to call the office with any questions, concerns, changes symptoms.

## 2015-05-03 ENCOUNTER — Ambulatory Visit (INDEPENDENT_AMBULATORY_CARE_PROVIDER_SITE_OTHER): Payer: Medicare Other | Admitting: Pulmonary Disease

## 2015-05-03 ENCOUNTER — Encounter: Payer: Self-pay | Admitting: Pulmonary Disease

## 2015-05-03 DIAGNOSIS — G4733 Obstructive sleep apnea (adult) (pediatric): Secondary | ICD-10-CM

## 2015-05-03 NOTE — Patient Instructions (Signed)
Get back on your cpap with the new mask, and let us know if you are not doing well.  Will send an order to your home care company to increase the pressure range on your device. Work on weight loss followup with Dr. Halford Chessman in 65mos.

## 2015-05-03 NOTE — Progress Notes (Signed)
   Subjective:    Patient ID: [name], female    DOB: 12/23/1955, 59 y.o.   MRN: [id number]  HPI Patient comes in today for follow-up of her obstructive sleep apnea. She has very severe disease, and was started on C Pap at the last visit. Unfortunately, she has not worn very much because of significant illness in her family, and also a mass that was poorly fitting. She is now getting back on her machine, and has gotten a new mask that she has yet to try.   Review of Systems  Constitutional: Negative for fever and unexpected weight change.  HENT: Positive for congestion. Negative for dental problem, ear pain, nosebleeds, postnasal drip, rhinorrhea, sinus pressure, sneezing, sore throat and trouble swallowing.   Eyes: Negative for redness and itching.  Respiratory: Positive for cough. Negative for chest tightness, shortness of breath and wheezing.   Cardiovascular: Negative for palpitations and leg swelling.  Gastrointestinal: Negative for nausea and vomiting.  Genitourinary: Negative for dysuria.  Musculoskeletal: Negative for joint swelling.  Skin: Negative for rash.  Neurological: Negative for headaches.  Hematological: Does not bruise/bleed easily.  Psychiatric/Behavioral: Negative for dysphoric mood. The patient is not nervous/anxious.        Objective:   Physical Exam Morbidly obese female in no acute distress Nose without purulence or discharge noted No skin breakdown or pressure necrosis from the C Pap mask Neck without lymphadenopathy or thyromegaly Lower extremities with mild edema, no cyanosis Alert and oriented, moves all 4 extremities.       Assessment & Plan:

## 2015-05-03 NOTE — Assessment & Plan Note (Signed)
The patient has been started on C Pap for her very severe obstructive sleep apnea, but unfortunately she has had a lot of health issues in her family that has kept her from wearing the device consistently. She does not have an issue with the pressure, but did have a lot of issues with the mask fit. She has gotten a new mask, and I've asked her to go ahead and get back on her device more consistently. Given the severity of her sleep apnea, I will expand her pressure range on her automatic setting. We will get a download the next visit to make sure that her apnea is adequately controlled, but she is to call if she continues to have issues with tolerance and compliance.

## 2015-05-04 ENCOUNTER — Telehealth: Payer: Self-pay | Admitting: Pulmonary Disease

## 2015-05-04 DIAGNOSIS — G4733 Obstructive sleep apnea (adult) (pediatric): Secondary | ICD-10-CM

## 2015-05-04 NOTE — Telephone Encounter (Signed)
Probably the only way is to have a repeat sleep study (medicare guidelines), then start back on cpap.  They have criteria for # of hours of usage in order to keep machine.  I would send this to [facility], and let them find out what needs to be done.

## 2015-05-04 NOTE — Telephone Encounter (Signed)
Called and spoke to pt. Pt stated she was informed Medicare is no longer going to cover her CPAP due to non compliance. Pt stated [facility] is to pick up the machine next week. Pt questioning what can be done to keep her machine.   [place] please advise.

## 2015-05-04 NOTE — Telephone Encounter (Signed)
PCC's please advise.  

## 2015-05-08 NOTE — Telephone Encounter (Signed)
Pt will need a new sleep study and start over for compliance purposes [name]

## 2015-05-08 NOTE — Telephone Encounter (Signed)
I have called Medicaid to see what other DME Companies the patient can use.

## 2015-05-08 NOTE — Telephone Encounter (Signed)
Need to find out from pt if she is willing to do this to get cpap back.

## 2015-05-09 NOTE — Telephone Encounter (Signed)
lmtcb x1 for pt. 

## 2015-05-10 NOTE — Telephone Encounter (Signed)
LMOM TCB x2

## 2015-05-11 NOTE — Telephone Encounter (Signed)
Order has been placed. Pt is aware that this has been done. Nothing further was needed.

## 2015-05-11 NOTE — Telephone Encounter (Signed)
Need to find out from [facility] what her insurance will allow to get her machine back.

## 2015-05-11 NOTE — Telephone Encounter (Signed)
PCC - please advise. Thanks! 

## 2015-05-11 NOTE — Telephone Encounter (Signed)
We need a split night study ordered [name]

## 2015-05-11 NOTE — Telephone Encounter (Signed)
Spoke with patient's niece and she said that patient is willing to do another sleep study if she can get a CPAP machine.    [place] - do you want HST or Split?  Please advise.

## 2015-05-18 ENCOUNTER — Ambulatory Visit: Payer: Medicare Other | Admitting: Podiatry

## 2015-05-23 ENCOUNTER — Encounter: Payer: Self-pay | Admitting: Podiatry

## 2015-05-23 ENCOUNTER — Ambulatory Visit (INDEPENDENT_AMBULATORY_CARE_PROVIDER_SITE_OTHER): Payer: Medicare Other | Admitting: Podiatry

## 2015-05-23 VITALS — BP 120/77 | HR 94 | Resp 15

## 2015-05-23 DIAGNOSIS — Z9889 Other specified postprocedural states: Secondary | ICD-10-CM | POA: Diagnosis not present

## 2015-06-02 NOTE — Progress Notes (Signed)
Patient ID: [name], female   DOB: May 16, 1956, 59 y.o.   MRN: [id number]  Subjective: [name] returns the office today to recheck the left big toe at her request. She appears to have partial nail avulsion which was delayed in healing. She states that since last appointment she would've the areas heal and she is doing well. She denies any redness or drainage from the area and denies any red streaks. She denies any systemic complaints as fevers, chills, nausea, vomiting. No other complaints at this time in no acute changes since last appointment.   Objective: AAO 3, NAD DP/PT pulses palpable, CRT less than 3 seconds Protective sensation decreased with Simms Weinstein monofilament, Achilles tendon reflex intact.  Left hallux toenail status post total nail avulsion. Area appears to be healed at this time. There is no overlying edema, erythema, ascending saline disc, fluctuance, crepitus, malodor, drainage/purulence. There is no tenderness to palpation overlying the area.  Remaining nails are hypertrophic, dystrophic, discolored however they are not elongated or painful this time. No surrounding erythema or drainage. No open lesions or pre-ulcerative lesions are identified. No other areas of tenderness bilateral lower extremities. No overlying edema, erythema, increased warmth. No pain with calf compression, swelling, warmth, erythema.  Assessment: Patient presents for follow-up evaluation of left hallux nail removal site.  Plan: -Treatment options including alternatives, risks, complications were discussed -At this time the left hallux toenail removal site appears to be healed and there is resolution of erythema. Continue to monitor for any reoccurrence. -Discussed daily foot inspection. If there are any changes, to call the office immediately.  -Follow-up as scheduled for routine care. In the meantime, encouraged to call the office with any questions, concerns, changes symptoms.

## 2015-06-20 ENCOUNTER — Other Ambulatory Visit: Payer: Self-pay | Admitting: Gastroenterology

## 2015-07-12 DIAGNOSIS — F331 Major depressive disorder, recurrent, moderate: Secondary | ICD-10-CM | POA: Insufficient documentation

## 2015-07-12 NOTE — H&P (Signed)
[name] HPI: She started to have issues with dysphagia that started 3-4 months ago. Her symptoms have progressively worsened over the time period. The patient states that her symptoms started with solids and then it has progressed to liquids. The symptoms are intermittent at this time, but she notices an increase in her frequency. With rice she vomited. GERD is a life long issue for her, but she has not taken any medications. In the past she did use Rolaids, but there was no benefit. There is no weight loss as a result of her symptoms. In fact, she has gained weight. On 06/12/2015 the patient's A1C was noted to be at 11.1%.  Past Medical History  Diagnosis Date  . HTN (hypertension)     takes Amlodipine daily  . Hyperlipidemia     takes Pravastatin daily  . Dizziness     related to CSF leakage  . Hx of gout   . Diarrhea   . Urinary frequency   . Urinary urgency   . Anxiety     doesn't take any meds for this  . Kidney stones   . Complication of anesthesia 01/2012    reports she extubated herself  . Asthma   . COPD (chronic obstructive pulmonary disease)   . History of chronic bronchitis 07/26/2012    "just about q winter"  . Shortness of breath     "lying down; w/exertion; during heat"  . OSA (obstructive sleep apnea) 07/26/2012    denies CPAP  . Type II diabetes mellitus   . Headache(784.0)     related to CSF leakage  . Stroke 03/2004    denies residual 07/26/2012  . Arthritis     knuckles  . Claustrophobia 07/26/2012    Past Surgical History  Procedure Laterality Date  . Nm myoview ltd  08/25/2011    Low risk study, no evidence of ischemia, EF 44%  . Sphenoidectomy  01/29/2012    Procedure: SPHENOIDECTOMY;  Surgeon: Ruby Cola, MD;  Location: [facility];  Service: ENT;  Laterality: Left;  REPAIR OF LEFT SPHENOID    . Nasal sinus surgery  01/29/2012    Procedure: ENDOSCOPIC SINUS SURGERY WITH STEALTH;  Surgeon: Ruby Cola, MD;  Location: [facility];  Service: ENT;   Laterality: N/A;  . Total abdominal hysterectomy  1994  . Refractive surgery  ~ 2010    left    Family History  Problem Relation Age of Onset  . Colon cancer Sister 58  . Breast cancer Mother 1  . Diabetes Sister 37  . Diabetes Sister 75  . Lung cancer Father   . Anesthesia problems Neg Hx   . Hypotension Neg Hx   . Malignant hyperthermia Neg Hx   . Pseudochol deficiency Neg Hx   . Breast cancer Sister   . Cancer Sister     mouth    Social History:  reports that she has been smoking Cigarettes.  She has a 17.5 pack-year smoking history. She does not have any smokeless tobacco history on file. She reports that she drinks alcohol. She reports that she does not use illicit drugs.  Allergies:  Allergies  Allergen Reactions  . Atorvastatin Other (See Comments) and Nausea And Vomiting    "legs cramped; moderate to almost severe"  . Metformin And Related Nausea And Vomiting  . Ace Inhibitors Cough and Nausea And Vomiting  . Hydrocodone-Acetaminophen Nausea And Vomiting    Medications: Scheduled: Continuous:  No results found for this or any previous visit (  from the past 24 hour(s)).   No results found.  ROS:  As stated above in the HPI otherwise negative.  There were no vitals taken for this visit.    PE: Gen: NAD, Alert and Oriented HEENT:  [place]/AT, EOMI Neck: Supple, no LAD Lungs: CTA Bilaterally CV: RRR without M/G/R ABM: Soft, NTND, +BS Ext: No C/C/E  Assessment/Plan: 1) Dysphagia - EGD.  Andreka Stucki D 07/12/2015, 12:33 PM

## 2015-07-13 ENCOUNTER — Encounter (HOSPITAL_COMMUNITY): Payer: Self-pay | Admitting: *Deleted

## 2015-07-13 ENCOUNTER — Ambulatory Visit (HOSPITAL_COMMUNITY): Payer: Medicare Other | Admitting: Certified Registered Nurse Anesthetist

## 2015-07-13 ENCOUNTER — Encounter (HOSPITAL_COMMUNITY)
Admission: RE | Disposition: A | Discharge status: Home / Self Care | Payer: Self-pay | Source: Ambulatory Visit | Attending: Gastroenterology

## 2015-07-13 ENCOUNTER — Ambulatory Visit (HOSPITAL_COMMUNITY)
Admission: RE | Admit: 2015-07-13 | Discharge: 2015-07-13 | Disposition: A | Discharge status: Home / Self Care | Payer: Medicare Other | Source: Ambulatory Visit | Attending: Gastroenterology | Admitting: Gastroenterology

## 2015-07-13 DIAGNOSIS — R1310 Dysphagia, unspecified: Secondary | ICD-10-CM | POA: Insufficient documentation

## 2015-07-13 DIAGNOSIS — E785 Hyperlipidemia, unspecified: Secondary | ICD-10-CM | POA: Diagnosis not present

## 2015-07-13 DIAGNOSIS — J449 Chronic obstructive pulmonary disease, unspecified: Secondary | ICD-10-CM | POA: Insufficient documentation

## 2015-07-13 DIAGNOSIS — E119 Type 2 diabetes mellitus without complications: Secondary | ICD-10-CM | POA: Diagnosis not present

## 2015-07-13 DIAGNOSIS — Z8673 Personal history of transient ischemic attack (TIA), and cerebral infarction without residual deficits: Secondary | ICD-10-CM | POA: Diagnosis not present

## 2015-07-13 DIAGNOSIS — I10 Essential (primary) hypertension: Secondary | ICD-10-CM | POA: Diagnosis not present

## 2015-07-13 DIAGNOSIS — K219 Gastro-esophageal reflux disease without esophagitis: Secondary | ICD-10-CM | POA: Diagnosis not present

## 2015-07-13 HISTORY — PX: ESOPHAGOGASTRODUODENOSCOPY (EGD) WITH PROPOFOL: SHX5813

## 2015-07-13 SURGERY — ESOPHAGOGASTRODUODENOSCOPY (EGD) WITH PROPOFOL
Anesthesia: Monitor Anesthesia Care

## 2015-07-13 MED ORDER — PROPOFOL 10 MG/ML IV BOLUS
INTRAVENOUS | Status: AC
  Filled 2015-07-13: qty 20

## 2015-07-13 MED ORDER — LACTATED RINGERS IV SOLN
INTRAVENOUS | Status: DC
  Administered 2015-07-13: 1000 mL via INTRAVENOUS

## 2015-07-13 MED ORDER — LIDOCAINE HCL (CARDIAC) 20 MG/ML IV SOLN
INTRAVENOUS | Status: AC
  Filled 2015-07-13: qty 5

## 2015-07-13 MED ORDER — SODIUM CHLORIDE 0.9 % IV SOLN: INTRAVENOUS | Status: DC

## 2015-07-13 SURGICAL SUPPLY — 14 items

## 2015-07-13 NOTE — Discharge Instructions (Signed)
Esophagogastroduodenoscopy °Care After °Refer to this sheet in the next few weeks. These instructions provide you with information on caring for yourself after your procedure. Your caregiver may also give you more specific instructions. Your treatment has been planned according to current medical practices, but problems sometimes occur. Call your caregiver if you have any problems or questions after your procedure.  °HOME CARE INSTRUCTIONS °· Do not eat or drink anything until the numbing medicine (local anesthetic) has worn off and your gag reflex has returned. You will know that the local anesthetic has worn off when you can swallow comfortably. °· Do not drive for 12 hours after the procedure or as directed by your caregiver. °· Only take medicines as directed by your caregiver. °SEEK MEDICAL CARE IF:  °· You cannot stop coughing. °· You are not urinating at all or less than usual. °SEEK IMMEDIATE MEDICAL CARE IF: °· You have difficulty swallowing. °· You cannot eat or drink. °· You have worsening throat or chest pain. °· You have dizziness, lightheadedness, or you faint. °· You have nausea or vomiting. °· You have chills. °· You have a fever. °· You have severe abdominal pain. °· You have black, tarry, or bloody stools. °Document Released: 11/10/2012 Document Reviewed: 11/10/2012 °ExitCare® Patient Information ©2015 [employer]. This information is not intended to replace advice given to you by your health care provider. Make sure you discuss any questions you have with your health care provider. ° °

## 2015-07-13 NOTE — Transfer of Care (Signed)
Immediate Anesthesia Transfer of Care Note  Patient: [name]  Procedure(s) Performed: Procedure(s): ESOPHAGOGASTRODUODENOSCOPY (EGD) WITH PROPOFOL (N/A)  Patient Location: PACU  Anesthesia Type:MAC  Level of Consciousness: awake, alert  and oriented  Airway & Oxygen Therapy: Patient Spontanous Breathing and Patient connected to face mask oxygen  Post-op Assessment: Report given to RN and Post -op Vital signs reviewed and stable  Post vital signs: Reviewed and stable  Last Vitals:  Filed Vitals:   07/13/15 1153  BP: 188/93  Pulse: 86  Temp: 36.9 C  Resp: 23    Complications: No apparent anesthesia complications

## 2015-07-13 NOTE — Anesthesia Postprocedure Evaluation (Signed)
  Anesthesia Post-op Note  Patient: [name]  Procedure(s) Performed: Procedure(s) (LRB): ESOPHAGOGASTRODUODENOSCOPY (EGD) WITH PROPOFOL (N/A)  Patient Location: PACU  Anesthesia Type: MAC  Level of Consciousness: awake and alert   Airway and Oxygen Therapy: Patient Spontanous Breathing  Post-op Pain: mild  Post-op Assessment: Post-op Vital signs reviewed, Patient's Cardiovascular Status Stable, Respiratory Function Stable, Patent Airway and No signs of Nausea or vomiting  Last Vitals:  Filed Vitals:   07/13/15 1234  BP:   Pulse: 73  Temp: 36.6 C  Resp: 12    Post-op Vital Signs: stable   Complications: No apparent anesthesia complications

## 2015-07-13 NOTE — Anesthesia Preprocedure Evaluation (Addendum)
Anesthesia Evaluation  Patient identified by MRN, date of birth, ID band Patient awake    Reviewed: Allergy & Precautions, H&P , NPO status , Patient's Chart, lab work & pertinent test results  History of Anesthesia Complications (+) DIFFICULT AIRWAY and history of anesthetic complications (patient is a glidescope view on previous intubations)  Airway Mallampati: II  TM Distance: >3 FB Neck ROM: full    Dental no notable dental hx.    Pulmonary sleep apnea , COPDCurrent Smoker,  breath sounds clear to auscultation  Pulmonary exam normal       Cardiovascular hypertension, + Peripheral Vascular Disease - CHF (patient had 2-D echo 07/2012 that showed normal EF, no valvular problems) Normal cardiovascular examRhythm:regular Rate:Normal     Neuro/Psych  Headaches, Anxiety Depression CVA, No Residual Symptoms    GI/Hepatic negative GI ROS, Neg liver ROS,   Endo/Other  diabetes, Poorly Controlled, Insulin DependentMorbid obesity  Renal/GU Renal disease     Musculoskeletal   Abdominal   Peds  Hematology negative hematology ROS (+)   Anesthesia Other Findings NPO appropriate, allergies reviewed Denies active cardiac or pulmonary symptoms, METS > 4 No recent congestive cough or symptoms of upper respiratory infection Meds -    Reproductive/Obstetrics negative OB ROS                          Anesthesia Physical Anesthesia Plan  ASA: III  Anesthesia Plan: MAC   Post-op Pain Management:    Induction: Intravenous  Airway Management Planned: Natural Airway  Additional Equipment:   Intra-op Plan:   Post-operative Plan:   Informed Consent: I have reviewed the patients History and Physical, chart, labs and discussed the procedure including the risks, benefits and alternatives for the proposed anesthesia with the patient or authorized representative who has indicated his/her understanding and  acceptance.     Plan Discussed with: Anesthesiologist, CRNA and Surgeon  Anesthesia Plan Comments: (Propofol mac)        Anesthesia Quick Evaluation

## 2015-07-13 NOTE — Op Note (Signed)
[facility] [place], [address]   ENDOSCOPY PROCEDURE REPORT  PATIENT: [name], [name]  MR#: [id number] BIRTHDATE: 04-25-1956 , 59  yrs. old GENDER: female ENDOSCOPIST:Merlin Golden Benson [place], MD REFERRED BY: PROCEDURE DATE:  01-Aug-2015 PROCEDURE:   EGD w/ biopsy ASA CLASS:    Class III INDICATIONS: dysphagia. MEDICATION: Monitored anesthesia care TOPICAL ANESTHETIC:   none  DESCRIPTION OF PROCEDURE:   After the risks and benefits of the procedure were explained, informed consent was obtained.  The Pentax Gastroscope [id number]  endoscope was introduced through the mouth  and advanced to the second portion of the duodenum .  The instrument was slowly withdrawn as the mucosa was fully examined. Estimated blood loss is zero unless otherwise noted in this procedure report.   FINDINGS: The esophagus and gastroesophageal junction were completely normal in appearance.  Cold biopsies were obtained in the mid and upper esophagus to evaluate for EoE.  The stomach was entered and closely examined.The antrum, angularis, and lesser curvature were well visualized, including a retroflexed view of the cardia and fundus.  The stomach wall was normally distensable.  The scope passed easily through the pylorus into the duodenum. The scope was then withdrawn from the patient and the procedure completed.  COMPLICATIONS: There were no immediate complications.  ENDOSCOPIC IMPRESSION: 1) Normal EGD. RECOMMENDATIONS: 1) Follow up biopsies. 2) Continue with omeprazole.   _______________________________ eSignedCarol Ada, MD August 01, 2015 12:32 PM     cc:  CPT CODES: ICD CODES:  The ICD and CPT codes recommended by this software are interpretations from the data that the clinical staff has captured with the software.  The verification of the translation of this report to the ICD and CPT codes and modifiers is the sole responsibility of the health care  institution and practicing physician where this report was generated.  [employer] will not be held responsible for the validity of the ICD and CPT codes included on this report.  AMA assumes no liability for data contained or not contained herein. CPT is a Designer, television/film set of the [employer].

## 2015-07-16 ENCOUNTER — Encounter (HOSPITAL_COMMUNITY): Payer: Self-pay | Admitting: Gastroenterology

## 2015-07-16 LAB — GLUCOSE, CAPILLARY: Glucose-Capillary: 190 mg/dL — ABNORMAL HIGH (ref 65–99)

## 2015-07-27 ENCOUNTER — Ambulatory Visit: Payer: Medicare Other | Admitting: Podiatry

## 2015-07-27 ENCOUNTER — Ambulatory Visit (INDEPENDENT_AMBULATORY_CARE_PROVIDER_SITE_OTHER): Payer: Medicare Other | Admitting: Podiatry

## 2015-07-27 ENCOUNTER — Encounter: Payer: Self-pay | Admitting: Podiatry

## 2015-07-27 VITALS — BP 115/58 | HR 96 | Resp 20

## 2015-07-27 DIAGNOSIS — M79676 Pain in unspecified toe(s): Secondary | ICD-10-CM

## 2015-07-27 DIAGNOSIS — B351 Tinea unguium: Secondary | ICD-10-CM | POA: Diagnosis not present

## 2015-07-28 NOTE — Progress Notes (Signed)
Patient ID: [name], female   DOB: Feb 01, 1956, 59 y.o.   MRN: [id number] Complaint:  Visit Type: Patient returns to my office for continued preventative foot care services. Complaint: Patient states" my nails have grown long and thick and become painful to walk and wear shoes" Patient has been diagnosed with DM with no foot complications. The patient presents for preventative foot care services. No changes to ROS.  She has history of permanent nail removal by Dr. Blenda Mounts.  She also developed cellulitis upon removal of right hallux toenail  Podiatric Exam: Vascular: dorsalis pedis and posterior tibial pulses are palpable bilateral. Capillary return is immediate. Temperature gradient is WNL. Skin turgor WNL  Sensorium: Diminished Semmes Weinstein monofilament test. Normal tactile sensation bilaterally. Nail Exam: Pt has thick disfigured discolored nails with subungual debris noted bilateral entire nail hallux through fifth toenails except left hallux nail. Ulcer Exam: There is no evidence of ulcer or pre-ulcerative changes or infection. Orthopedic Exam: Muscle tone and strength are WNL. No limitations in general ROM. No crepitus or effusions noted. Foot type and digits show no abnormalities. Bony prominences are unremarkable. Skin: No Porokeratosis. No infection or ulcers  Diagnosis:  Onychomycosis, , Pain in right toe, pain in left toes  Treatment & Plan Procedures and Treatment: Consent by patient was obtained for treatment procedures. The patient understood the discussion of treatment and procedures well. All questions were answered thoroughly reviewed. Debridement of mycotic and hypertrophic toenails, 1 through 5 bilateral and clearing of subungual debris. No ulceration, no infection noted.  Return Visit-Office Procedure: Patient instructed to return to the office for a follow up visit 3 months for continued evaluation and treatment.

## 2015-07-30 ENCOUNTER — Ambulatory Visit (HOSPITAL_BASED_OUTPATIENT_CLINIC_OR_DEPARTMENT_OTHER): Payer: Medicare Other

## 2015-10-05 ENCOUNTER — Ambulatory Visit: Payer: Medicare Other | Admitting: Podiatry

## 2015-10-08 ENCOUNTER — Encounter (HOSPITAL_BASED_OUTPATIENT_CLINIC_OR_DEPARTMENT_OTHER): Payer: Medicare Other

## 2015-10-10 ENCOUNTER — Ambulatory Visit (INDEPENDENT_AMBULATORY_CARE_PROVIDER_SITE_OTHER): Payer: Medicare Other | Admitting: Podiatry

## 2015-10-10 ENCOUNTER — Encounter: Payer: Self-pay | Admitting: Podiatry

## 2015-10-10 DIAGNOSIS — E1140 Type 2 diabetes mellitus with diabetic neuropathy, unspecified: Secondary | ICD-10-CM | POA: Diagnosis not present

## 2015-10-10 DIAGNOSIS — M79676 Pain in unspecified toe(s): Secondary | ICD-10-CM

## 2015-10-10 DIAGNOSIS — B351 Tinea unguium: Secondary | ICD-10-CM

## 2015-10-10 NOTE — Progress Notes (Signed)
Patient ID: [name], female   DOB: 1956/10/25, 59 y.o.   MRN: [id number] Complaint:  Visit Type: Patient returns to my office for continued preventative foot care services. Complaint: Patient states" my nails have grown long and thick and become painful to walk and wear shoes" Patient has been diagnosed with DM with no foot complications. The patient presents for preventative foot care services. No changes to ROS.  She has history of permanent nail removal by Dr. Blenda Mounts.  She also developed cellulitis upon removal of right hallux toenail.She still is very concerned about her left big toe.  She says it is painful to touch.  Podiatric Exam: Vascular: dorsalis pedis and posterior tibial pulses are palpable bilateral. Capillary return is immediate. Temperature gradient is WNL. Skin turgor WNL  Sensorium: Diminished Semmes Weinstein monofilament test. Normal tactile sensation bilaterally. Nail Exam: Pt has thick disfigured discolored nails with subungual debris noted bilateral entire nail hallux through fifth toenails except left hallux nail. Ulcer Exam: There is no evidence of ulcer or pre-ulcerative changes or infection. Orthopedic Exam: Muscle tone and strength are WNL. No limitations in general ROM. No crepitus or effusions noted. Foot type and digits show no abnormalities. Bony prominences are unremarkable. Skin: No Porokeratosis. No infection or ulcers  Diagnosis:  Onychomycosis, , Pain in right toe, pain in left toes  Treatment & Plan Procedures and Treatment: Consent by patient was obtained for treatment procedures. The patient understood the discussion of treatment and procedures well. All questions were answered thoroughly reviewed. Debridement of mycotic and hypertrophic toenails, 1 through 5 bilateral and clearing of subungual debris. No ulceration, no infection noted. Discussed her nail condition and recommend an x-ray be taken to check on bone left hallux. Return Visit-Office  Procedure: Patient instructed to return to the office for a follow up visit 3 months for continued evaluation and treatment.

## 2015-11-19 ENCOUNTER — Other Ambulatory Visit: Payer: Self-pay

## 2015-11-19 DIAGNOSIS — Z1231 Encounter for screening mammogram for malignant neoplasm of breast: Secondary | ICD-10-CM

## 2015-12-13 ENCOUNTER — Ambulatory Visit: Payer: Medicare Other

## 2016-01-09 ENCOUNTER — Ambulatory Visit (INDEPENDENT_AMBULATORY_CARE_PROVIDER_SITE_OTHER): Payer: Medicare Other | Admitting: Podiatry

## 2016-01-09 ENCOUNTER — Encounter: Payer: Self-pay | Admitting: Podiatry

## 2016-01-09 DIAGNOSIS — E1140 Type 2 diabetes mellitus with diabetic neuropathy, unspecified: Secondary | ICD-10-CM | POA: Diagnosis not present

## 2016-01-09 DIAGNOSIS — B351 Tinea unguium: Secondary | ICD-10-CM

## 2016-01-09 DIAGNOSIS — M79676 Pain in unspecified toe(s): Secondary | ICD-10-CM | POA: Diagnosis not present

## 2016-01-09 NOTE — Progress Notes (Signed)
Patient ID: [name], female   DOB: [birth year], 60 y.o.   MRN: [id number] Complaint:  Visit Type: Patient returns to my office for continued preventative foot care services. Complaint: Patient states" my nails have grown long and thick and become painful to walk and wear shoes" Patient has been diagnosed with DM with no foot complications. The patient presents for preventative foot care services. No changes to ROS.  She has history of permanent nail removal by Dr. Blenda Mounts.  She also developed cellulitis upon removal of right hallux toenail.She still is very concerned about her left big toe.  She says it is painful to touch.  Podiatric Exam: Vascular: dorsalis pedis and posterior tibial pulses are palpable bilateral. Capillary return is immediate. Temperature gradient is WNL. Skin turgor WNL  Sensorium: Diminished Semmes Weinstein monofilament test. Normal tactile sensation bilaterally. Nail Exam: Pt has thick disfigured discolored nails with subungual debris noted bilateral entire nail hallux through fifth toenails except left hallux nail. Ulcer Exam: There is no evidence of ulcer or pre-ulcerative changes or infection. Orthopedic Exam: Muscle tone and strength are WNL. No limitations in general ROM. No crepitus or effusions noted. Foot type and digits show no abnormalities. Bony prominences are unremarkable. Occasional pain big toe joint pain right nfoot. Skin: No Porokeratosis. No infection or ulcers  Diagnosis:  Onychomycosis, , Pain in right toe, pain in left toes  Treatment & Plan Procedures and Treatment: Consent by patient was obtained for treatment procedures. The patient understood the discussion of treatment and procedures well. All questions were answered thoroughly reviewed. Debridement of mycotic and hypertrophic toenails, 1 through 5 bilateral and clearing of subungual debris. No ulceration, no infection noted. Discussed her nail condition and recommend an x-ray be taken to check  on bone left hallux.   Gardiner Barefoot DPM Return Visit-Office Procedure: Patient instructed to return to the office for a follow up visit 3 months for continued evaluation and treatment.

## 2016-01-10 DIAGNOSIS — I10 Essential (primary) hypertension: Secondary | ICD-10-CM | POA: Diagnosis not present

## 2016-01-10 DIAGNOSIS — R300 Dysuria: Secondary | ICD-10-CM | POA: Diagnosis not present

## 2016-01-10 DIAGNOSIS — F331 Major depressive disorder, recurrent, moderate: Secondary | ICD-10-CM | POA: Diagnosis not present

## 2016-01-10 DIAGNOSIS — E1165 Type 2 diabetes mellitus with hyperglycemia: Secondary | ICD-10-CM | POA: Diagnosis not present

## 2016-01-10 DIAGNOSIS — Z794 Long term (current) use of insulin: Secondary | ICD-10-CM | POA: Diagnosis not present

## 2016-01-10 DIAGNOSIS — Z23 Encounter for immunization: Secondary | ICD-10-CM | POA: Diagnosis not present

## 2016-01-10 DIAGNOSIS — R0989 Other specified symptoms and signs involving the circulatory and respiratory systems: Secondary | ICD-10-CM | POA: Diagnosis not present

## 2016-01-10 DIAGNOSIS — R42 Dizziness and giddiness: Secondary | ICD-10-CM | POA: Diagnosis not present

## 2016-01-14 DIAGNOSIS — I708 Atherosclerosis of other arteries: Secondary | ICD-10-CM | POA: Diagnosis not present

## 2016-02-27 DIAGNOSIS — M5441 Lumbago with sciatica, right side: Secondary | ICD-10-CM | POA: Diagnosis not present

## 2016-02-27 DIAGNOSIS — M5442 Lumbago with sciatica, left side: Secondary | ICD-10-CM | POA: Diagnosis not present

## 2016-04-02 ENCOUNTER — Ambulatory Visit: Payer: Medicare Other | Admitting: Podiatry

## 2016-04-10 ENCOUNTER — Ambulatory Visit (INDEPENDENT_AMBULATORY_CARE_PROVIDER_SITE_OTHER): Payer: Medicare Other | Admitting: Podiatry

## 2016-04-10 ENCOUNTER — Encounter: Payer: Self-pay | Admitting: Podiatry

## 2016-04-10 DIAGNOSIS — B351 Tinea unguium: Secondary | ICD-10-CM

## 2016-04-10 DIAGNOSIS — E1140 Type 2 diabetes mellitus with diabetic neuropathy, unspecified: Secondary | ICD-10-CM | POA: Diagnosis not present

## 2016-04-10 DIAGNOSIS — M79676 Pain in unspecified toe(s): Secondary | ICD-10-CM

## 2016-04-10 NOTE — Progress Notes (Signed)
Patient ID: [name], female   DOB: 1956/11/22, 60 y.o.   MRN: [id number] Complaint:  Visit Type: Patient returns to my office for continued preventative foot care services. Complaint: Patient states" my nails have grown long and thick and become painful to walk and wear shoes" Patient has been diagnosed with DM with no foot complications. The patient presents for preventative foot care services. No changes to ROS.  She has history of permanent nail removal by Dr. Blenda Mounts.  She also developed cellulitis upon removal of right hallux toenail.She still is very concerned about her left big toe.  She says it is painful to touch.  Podiatric Exam: Vascular: dorsalis pedis and posterior tibial pulses are palpable bilateral. Capillary return is immediate. Temperature gradient is WNL. Skin turgor WNL  Sensorium: Diminished Semmes Weinstein monofilament test. Normal tactile sensation bilaterally. Nail Exam: Pt has thick disfigured discolored nails with subungual debris noted bilateral entire nail hallux through fifth toenails except left hallux nail. Ulcer Exam: There is no evidence of ulcer or pre-ulcerative changes or infection. Orthopedic Exam: Muscle tone and strength are WNL. No limitations in general ROM. No crepitus or effusions noted. Foot type and digits show no abnormalities. Bony prominences are unremarkable. Occasional pain big toe joint pain right nfoot. Skin: No Porokeratosis. No infection or ulcers  Diagnosis:  Onychomycosis, , Pain in right toe, pain in left toes  Treatment & Plan Procedures and Treatment: Consent by patient was obtained for treatment procedures. The patient understood the discussion of treatment and procedures well. All questions were answered thoroughly reviewed. Debridement of mycotic and hypertrophic toenails, 1 through 5 bilateral and clearing of subungual debris. No ulceration, no infection noted.     Return Visit-Office Procedure: Patient instructed to return  to the office for a follow up visit 10 weeks. for continued evaluation and treatment.   Gardiner Barefoot DPM

## 2016-05-02 DIAGNOSIS — E119 Type 2 diabetes mellitus without complications: Secondary | ICD-10-CM | POA: Diagnosis not present

## 2016-05-02 DIAGNOSIS — H2513 Age-related nuclear cataract, bilateral: Secondary | ICD-10-CM | POA: Diagnosis not present

## 2016-05-02 DIAGNOSIS — H35372 Puckering of macula, left eye: Secondary | ICD-10-CM | POA: Diagnosis not present

## 2016-05-02 DIAGNOSIS — H34832 Tributary (branch) retinal vein occlusion, left eye, with macular edema: Secondary | ICD-10-CM | POA: Diagnosis not present

## 2016-05-06 DIAGNOSIS — E1142 Type 2 diabetes mellitus with diabetic polyneuropathy: Secondary | ICD-10-CM | POA: Diagnosis not present

## 2016-05-06 DIAGNOSIS — Z794 Long term (current) use of insulin: Secondary | ICD-10-CM | POA: Diagnosis not present

## 2016-05-06 DIAGNOSIS — M545 Low back pain: Secondary | ICD-10-CM | POA: Diagnosis not present

## 2016-05-06 DIAGNOSIS — I10 Essential (primary) hypertension: Secondary | ICD-10-CM | POA: Diagnosis not present

## 2016-05-06 DIAGNOSIS — G8929 Other chronic pain: Secondary | ICD-10-CM | POA: Diagnosis not present

## 2016-05-06 DIAGNOSIS — F17219 Nicotine dependence, cigarettes, with unspecified nicotine-induced disorders: Secondary | ICD-10-CM | POA: Diagnosis not present

## 2016-05-06 DIAGNOSIS — F331 Major depressive disorder, recurrent, moderate: Secondary | ICD-10-CM | POA: Diagnosis not present

## 2016-05-06 DIAGNOSIS — E1165 Type 2 diabetes mellitus with hyperglycemia: Secondary | ICD-10-CM | POA: Diagnosis not present

## 2016-06-17 DIAGNOSIS — F331 Major depressive disorder, recurrent, moderate: Secondary | ICD-10-CM | POA: Diagnosis not present

## 2016-06-17 DIAGNOSIS — G8929 Other chronic pain: Secondary | ICD-10-CM | POA: Diagnosis not present

## 2016-06-17 DIAGNOSIS — M545 Low back pain: Secondary | ICD-10-CM | POA: Diagnosis not present

## 2016-06-17 DIAGNOSIS — E1142 Type 2 diabetes mellitus with diabetic polyneuropathy: Secondary | ICD-10-CM | POA: Diagnosis not present

## 2016-06-17 DIAGNOSIS — Z794 Long term (current) use of insulin: Secondary | ICD-10-CM | POA: Diagnosis not present

## 2016-06-25 ENCOUNTER — Ambulatory Visit
Admission: RE | Admit: 2016-06-25 | Discharge: 2016-06-25 | Disposition: A | Discharge status: Home / Self Care | Payer: Medicare Other | Source: Ambulatory Visit

## 2016-06-25 DIAGNOSIS — Z1231 Encounter for screening mammogram for malignant neoplasm of breast: Secondary | ICD-10-CM | POA: Diagnosis not present

## 2016-06-26 ENCOUNTER — Ambulatory Visit: Payer: Medicare Other | Admitting: Podiatry

## 2016-07-03 ENCOUNTER — Encounter: Payer: Self-pay | Admitting: Podiatry

## 2016-07-03 ENCOUNTER — Ambulatory Visit (INDEPENDENT_AMBULATORY_CARE_PROVIDER_SITE_OTHER): Payer: Medicare Other | Admitting: Podiatry

## 2016-07-03 DIAGNOSIS — B351 Tinea unguium: Secondary | ICD-10-CM | POA: Diagnosis not present

## 2016-07-03 DIAGNOSIS — Z9889 Other specified postprocedural states: Secondary | ICD-10-CM

## 2016-07-03 DIAGNOSIS — M79676 Pain in unspecified toe(s): Secondary | ICD-10-CM

## 2016-07-03 DIAGNOSIS — E1140 Type 2 diabetes mellitus with diabetic neuropathy, unspecified: Secondary | ICD-10-CM

## 2016-07-03 NOTE — Progress Notes (Signed)
Patient ID: [name], female   DOB: 26-Aug-1956, 60 y.o.   MRN: [id number] Complaint:  Visit Type: Patient returns to my office for continued preventative foot care services. Complaint: Patient states" my nails have grown long and thick and become painful to walk and wear shoes" Patient has been diagnosed with DM with no foot complications. The patient presents for preventative foot care services. No changes to ROS.  She has history of permanent nail removal by Dr. Blenda Mounts.  She also developed cellulitis upon removal of right hallux toenail.She still is very concerned about her left big toe.  She says it is painful to touch.  Podiatric Exam: Vascular: dorsalis pedis and posterior tibial pulses are palpable bilateral. Capillary return is immediate. Temperature gradient is WNL. Skin turgor WNL  Sensorium: Diminished Semmes Weinstein monofilament test. Normal tactile sensation bilaterally. Nail Exam: Pt has thick disfigured discolored nails with subungual debris noted bilateral entire nail hallux through fifth toenails except left hallux nail. Ulcer Exam: There is no evidence of ulcer or pre-ulcerative changes or infection. Orthopedic Exam: Muscle tone and strength are WNL. No limitations in general ROM. No crepitus or effusions noted. Foot type and digits show no abnormalities. Bony prominences are unremarkable. Occasional pain big toe joint pain right nfoot. Skin: No Porokeratosis. No infection or ulcers  Diagnosis:  Onychomycosis, , Pain in right toe, pain in left toes  Treatment & Plan Procedures and Treatment: Consent by patient was obtained for treatment procedures. The patient understood the discussion of treatment and procedures well. All questions were answered thoroughly reviewed. Debridement of mycotic and hypertrophic toenails, 1 through 5 bilateral and clearing of subungual debris. No ulceration, no infection noted.     Return Visit-Office Procedure: Patient instructed to return  to the office for a follow up visit 10 weeks. for continued evaluation and treatment. She is concerned about her painful great toe left foot which had nail surgery previously performed.  She is experiencing pain out of proportion.  She was told to come to [place] for xray left hallux.   Gardiner Barefoot DPM

## 2016-07-04 ENCOUNTER — Ambulatory Visit (INDEPENDENT_AMBULATORY_CARE_PROVIDER_SITE_OTHER): Payer: Medicare Other

## 2016-07-04 ENCOUNTER — Encounter: Payer: Self-pay | Admitting: Podiatry

## 2016-07-04 ENCOUNTER — Ambulatory Visit (INDEPENDENT_AMBULATORY_CARE_PROVIDER_SITE_OTHER): Payer: Medicare Other | Admitting: Podiatry

## 2016-07-04 DIAGNOSIS — E1140 Type 2 diabetes mellitus with diabetic neuropathy, unspecified: Secondary | ICD-10-CM

## 2016-07-04 DIAGNOSIS — S92422A Displaced fracture of distal phalanx of left great toe, initial encounter for closed fracture: Secondary | ICD-10-CM | POA: Diagnosis not present

## 2016-07-04 DIAGNOSIS — R52 Pain, unspecified: Secondary | ICD-10-CM | POA: Diagnosis not present

## 2016-07-04 DIAGNOSIS — M79676 Pain in unspecified toe(s): Secondary | ICD-10-CM

## 2016-07-04 NOTE — Progress Notes (Signed)
This patient presents the office with severe pain coming from her big toe left foot. She says she had surgery performed over a year ago and the pain has continued for the past year. She says she is unable to touch and site of the nail surgery due to the level of severe pain.  She was seen at [facility] yesterday and we discussed her pain and decided to bring her to the [place] office to follow-up with an x-ray to evaluate the bone on her left great toe  Neurovascular status is the same that was dictated yesterday. Examination of her left big toe reveals palpable pain noted distal to the IPJ. She also has severe palpable pain noted to the nailbed left hallux. There is swelling and  noted through the IPJ.  S/P Closed distal phalanx left hallux.    ROV.  X-ray was taken and it appears to have an old fracture noted, which is intra-articular in nature. This x-ray is inconclusive for her severe pain. Therefore, I will order a CT scan for an evaluation of the distal phalanx of the left hallux. I discussed with Mateo Flow and she told me to put in the order. Patient will be called by the office once the CT scan is scheduled.    Gardiner Barefoot DPM

## 2016-07-08 ENCOUNTER — Telehealth: Payer: Self-pay | Admitting: *Deleted

## 2016-07-08 DIAGNOSIS — M79675 Pain in left toe(s): Secondary | ICD-10-CM

## 2016-07-08 NOTE — Telephone Encounter (Addendum)
Dr. Prudence Davidson ordered CT. Faxed to [facility].07/15/2016-DrPrudence Davidson states CT Scan shows no sign of infection, may make an appt if continuing to have problems. Unable to leave message on mobile phone, the line was disconnect. Unable to leave a message on the home phone the phone rang over 1 minute. 07/31/2016-Pt called for CT scan results. 08/04/2016-Pt called for CT Results.  I informed pt I would give to Dr. Prudence Davidson tomorrow, and pt states she is going back to Dr. Jacqualyn Posey.  I told her I would let Dr. Prudence Davidson know and he would probably give CT to Dr. Jacqualyn Posey to review, then I would call again. 08/05/2016-DrPrudence Davidson reviewed pt's CT Scan as without infection, recommends continue with prn nail care and ordered [facility] Peripheral Neuropathy cream for the area. I informed pt of Dr. Burnell Blanks review and orders, pt states understanding and is willing to try the peripheral neuropathy cream. Faxed to [facility]. 08/08/2016-Dave - [facility] states pt's insurance will not pay for the peripheral neuropathy cream and he would like an order to switch to the authorized substitute.  Dr. Prudence Davidson states okay to switch. Orders called to [facility], left message to fill as the authorized substitute.

## 2016-07-14 ENCOUNTER — Ambulatory Visit
Admission: RE | Admit: 2016-07-14 | Discharge: 2016-07-14 | Disposition: A | Discharge status: Home / Self Care | Payer: Medicare Other | Source: Ambulatory Visit | Attending: Podiatry | Admitting: Podiatry

## 2016-07-14 DIAGNOSIS — M79675 Pain in left toe(s): Secondary | ICD-10-CM

## 2016-07-14 DIAGNOSIS — R600 Localized edema: Secondary | ICD-10-CM | POA: Diagnosis not present

## 2016-07-31 DIAGNOSIS — M545 Low back pain: Secondary | ICD-10-CM | POA: Diagnosis not present

## 2016-07-31 DIAGNOSIS — M4726 Other spondylosis with radiculopathy, lumbar region: Secondary | ICD-10-CM | POA: Diagnosis not present

## 2016-07-31 DIAGNOSIS — M5416 Radiculopathy, lumbar region: Secondary | ICD-10-CM | POA: Diagnosis not present

## 2016-07-31 DIAGNOSIS — M5136 Other intervertebral disc degeneration, lumbar region: Secondary | ICD-10-CM | POA: Diagnosis not present

## 2016-08-05 MED ORDER — NONFORMULARY OR COMPOUNDED ITEM: 11 refills | Status: DC

## 2016-08-08 ENCOUNTER — Encounter: Payer: Self-pay | Admitting: Internal Medicine

## 2016-08-08 MED ORDER — NONFORMULARY OR COMPOUNDED ITEM: 11 refills | Status: DC

## 2016-08-16 ENCOUNTER — Other Ambulatory Visit: Payer: Self-pay | Admitting: Orthopaedic Surgery

## 2016-08-16 DIAGNOSIS — M4726 Other spondylosis with radiculopathy, lumbar region: Secondary | ICD-10-CM

## 2016-08-23 ENCOUNTER — Ambulatory Visit
Admission: RE | Admit: 2016-08-23 | Discharge: 2016-08-23 | Disposition: A | Discharge status: Home / Self Care | Payer: Medicare Other | Source: Ambulatory Visit | Attending: Orthopaedic Surgery | Admitting: Orthopaedic Surgery

## 2016-08-23 DIAGNOSIS — M5126 Other intervertebral disc displacement, lumbar region: Secondary | ICD-10-CM | POA: Diagnosis not present

## 2016-08-23 DIAGNOSIS — M4726 Other spondylosis with radiculopathy, lumbar region: Secondary | ICD-10-CM

## 2016-08-28 DIAGNOSIS — M5136 Other intervertebral disc degeneration, lumbar region: Secondary | ICD-10-CM | POA: Diagnosis not present

## 2016-08-28 DIAGNOSIS — M4726 Other spondylosis with radiculopathy, lumbar region: Secondary | ICD-10-CM | POA: Diagnosis not present

## 2016-09-04 ENCOUNTER — Ambulatory Visit: Payer: Medicare Other | Admitting: Podiatry

## 2016-09-05 ENCOUNTER — Ambulatory Visit: Payer: Medicare Other | Admitting: Podiatry

## 2016-09-18 DIAGNOSIS — M47816 Spondylosis without myelopathy or radiculopathy, lumbar region: Secondary | ICD-10-CM | POA: Diagnosis not present

## 2016-09-18 DIAGNOSIS — M5136 Other intervertebral disc degeneration, lumbar region: Secondary | ICD-10-CM | POA: Diagnosis not present

## 2016-09-18 DIAGNOSIS — M545 Low back pain: Secondary | ICD-10-CM | POA: Diagnosis not present

## 2016-09-24 ENCOUNTER — Ambulatory Visit: Payer: Medicare Other | Admitting: *Deleted

## 2016-09-24 VITALS — Ht 62.0 in | Wt 248.0 lb

## 2016-09-24 DIAGNOSIS — Z1211 Encounter for screening for malignant neoplasm of colon: Secondary | ICD-10-CM

## 2016-09-24 MED ORDER — NA SULFATE-K SULFATE-MG SULF 17.5-3.13-1.6 GM/177ML PO SOLN: 1.0000 | Freq: Once | ORAL | 0 refills | Status: AC

## 2016-09-24 NOTE — Progress Notes (Signed)
Denies allergies to eggs or soy products. Denies complications with sedation or anesthesia. Denies O2 use. Denies use of diet or weight loss medications.  Emmi instructions given for colonoscopy.  

## 2016-09-29 DIAGNOSIS — M47816 Spondylosis without myelopathy or radiculopathy, lumbar region: Secondary | ICD-10-CM | POA: Diagnosis not present

## 2016-10-08 ENCOUNTER — Encounter: Payer: Self-pay | Admitting: Internal Medicine

## 2016-10-08 ENCOUNTER — Ambulatory Visit (AMBULATORY_SURGERY_CENTER): Payer: Medicare Other | Admitting: Internal Medicine

## 2016-10-08 VITALS — BP 156/81 | HR 74 | Temp 97.8°F | Resp 25 | Ht 62.0 in | Wt 248.0 lb

## 2016-10-08 DIAGNOSIS — Z800 Family history of malignant neoplasm of digestive organs: Secondary | ICD-10-CM

## 2016-10-08 DIAGNOSIS — Z1211 Encounter for screening for malignant neoplasm of colon: Secondary | ICD-10-CM

## 2016-10-08 DIAGNOSIS — I10 Essential (primary) hypertension: Secondary | ICD-10-CM | POA: Diagnosis not present

## 2016-10-08 DIAGNOSIS — D122 Benign neoplasm of ascending colon: Secondary | ICD-10-CM

## 2016-10-08 DIAGNOSIS — D120 Benign neoplasm of cecum: Secondary | ICD-10-CM

## 2016-10-08 DIAGNOSIS — D128 Benign neoplasm of rectum: Secondary | ICD-10-CM

## 2016-10-08 DIAGNOSIS — K635 Polyp of colon: Secondary | ICD-10-CM

## 2016-10-08 DIAGNOSIS — G4733 Obstructive sleep apnea (adult) (pediatric): Secondary | ICD-10-CM | POA: Diagnosis not present

## 2016-10-08 DIAGNOSIS — Z1212 Encounter for screening for malignant neoplasm of rectum: Secondary | ICD-10-CM | POA: Diagnosis not present

## 2016-10-08 DIAGNOSIS — D125 Benign neoplasm of sigmoid colon: Secondary | ICD-10-CM

## 2016-10-08 DIAGNOSIS — D127 Benign neoplasm of rectosigmoid junction: Secondary | ICD-10-CM | POA: Diagnosis not present

## 2016-10-08 DIAGNOSIS — K621 Rectal polyp: Secondary | ICD-10-CM

## 2016-10-08 DIAGNOSIS — E109 Type 1 diabetes mellitus without complications: Secondary | ICD-10-CM | POA: Diagnosis not present

## 2016-10-08 DIAGNOSIS — I2510 Atherosclerotic heart disease of native coronary artery without angina pectoris: Secondary | ICD-10-CM | POA: Diagnosis not present

## 2016-10-08 LAB — GLUCOSE, CAPILLARY
Glucose-Capillary: 345 mg/dL — ABNORMAL HIGH (ref 65–99)
Glucose-Capillary: 301 mg/dL — ABNORMAL HIGH (ref 65–99)

## 2016-10-08 MED ORDER — SODIUM CHLORIDE 0.9 % IV SOLN: 500.0000 mL | INTRAVENOUS | Status: DC

## 2016-10-08 NOTE — Op Note (Signed)
[facility] Patient Name: [name] Procedure Date: 10/08/2016 8:00 AM MRN: [id number] Endoscopist: Jerene Bears , MD Age: 60 Referring MD:  Date of Birth: 04-20-1956 Gender: Female Account #: [id number] Procedure:                Colonoscopy Indications:              Screening in patient at increased risk: Family                            history of 1st-degree relative with colorectal                            cancer before age 8 years, This is the patient's                            first colonoscopy Medicines:                Monitored Anesthesia Care Procedure:                Pre-Anesthesia Assessment:                           - Prior to the procedure, a History and Physical                            was performed, and patient medications and                            allergies were reviewed. The patient's tolerance of                            previous anesthesia was also reviewed. The risks                            and benefits of the procedure and the sedation                            options and risks were discussed with the patient.                            All questions were answered, and informed consent                            was obtained. Prior Anticoagulants: The patient has                            taken no previous anticoagulant or antiplatelet                            agents. ASA Grade Assessment: III - A patient with                            severe systemic disease. After reviewing the risks  and benefits, the patient was deemed in                            satisfactory condition to undergo the procedure.                           After obtaining informed consent, the colonoscope                            was passed under direct vision. Throughout the                            procedure, the patient's blood pressure, pulse, and                            oxygen saturations were monitored  continuously. The                            Model PCF-H190L [id number]) scope was introduced                            through the anus and advanced to the the cecum,                            identified by appendiceal orifice and ileocecal                            valve. The colonoscopy was performed without                            difficulty. The patient tolerated the procedure                            well. The quality of the bowel preparation was                            good. The ileocecal valve, appendiceal orifice, and                            rectum were photographed. Scope In: 8:12:13 AM Scope Out: 8:33:49 AM Scope Withdrawal Time: 0 hours 18 minutes 32 seconds  Total Procedure Duration: 0 hours 21 minutes 36 seconds  Findings:                 The digital rectal exam was normal.                           A 3 mm polyp was found in the cecum. The polyp was                            sessile. The polyp was removed with a cold biopsy                            forceps. Resection and retrieval were complete.  There was a small lipoma, in the ascending colon.                           A 5 mm polyp was found in the ascending colon. The                            polyp was sessile. The polyp was removed with a                            cold snare. Resection and retrieval were complete.                           A 12 mm polyp was found in the ascending colon. The                            polyp was sessile. The polyp was removed with a hot                            snare. Resection and retrieval were complete.                           Six sessile polyps were found in the rectum and                            distal sigmoid colon. The polyps were 3 to 6 mm in                            size. These polyps were removed with a cold snare.                            Resection and retrieval were complete.                           Multiple  small-mouthed diverticula were found in                            the sigmoid colon, descending colon and ascending                            colon.                           Internal hemorrhoids were found during                            retroflexion. The hemorrhoids were small. Complications:            No immediate complications. Estimated Blood Loss:     Estimated blood loss was minimal. Impression:               - One 3 mm polyp in the cecum, removed with a cold  biopsy forceps. Resected and retrieved.                           - Small lipoma in the ascending colon.                           - One 5 mm polyp in the ascending colon, removed                            with a cold snare. Resected and retrieved.                           - One 12 mm polyp in the ascending colon, removed                            with a hot snare. Resected and retrieved.                           - Six 3 to 6 mm polyps in the rectum and in the                            distal sigmoid colon, removed with a hot snare (1,                            rectum) and with cold snare (5). Resected and                            retrieved.                           - Moderate diverticulosis in the sigmoid colon, in                            the descending colon and in the ascending colon.                           - Internal hemorrhoids. Recommendation:           - Patient has a contact number available for                            emergencies. The signs and symptoms of potential                            delayed complications were discussed with the                            patient. Return to normal activities tomorrow.                            Written discharge instructions were provided to the                            patient.                           -  Resume previous diet.                           - Continue present medications.                           - Await  pathology results.                           - Repeat colonoscopy for surveillance based on                            pathology results.                           - No ibuprofen, naproxen, or other non-steroidal                            anti-inflammatory drugs for 2 weeks after polyp                            removal. Jerene Bears, MD 10/08/2016 8:40:34 AM This report has been signed electronically.

## 2016-10-08 NOTE — Progress Notes (Signed)
TO PACU  Pt awake and alert. Report to RN 

## 2016-10-08 NOTE — Patient Instructions (Signed)
YOU HAD AN ENDOSCOPIC PROCEDURE TODAY AT [facility]:   Refer to the procedure report that was given to you for any specific questions about what was found during the examination.  If the procedure report does not answer your questions, please call your gastroenterologist to clarify.  If you requested that your care partner not be given the details of your procedure findings, then the procedure report has been included in a sealed envelope for you to review at your convenience later.  YOU SHOULD EXPECT: Some feelings of bloating in the abdomen. Passage of more gas than usual.  Walking can help get rid of the air that was put into your GI tract during the procedure and reduce the bloating. If you had a lower endoscopy (such as a colonoscopy or flexible sigmoidoscopy) you may notice spotting of blood in your stool or on the toilet paper. If you underwent a bowel prep for your procedure, you may not have a normal bowel movement for a few days.  Please Note:  You might notice some irritation and congestion in your nose or some drainage.  This is from the oxygen used during your procedure.  There is no need for concern and it should clear up in a day or so.  SYMPTOMS TO REPORT IMMEDIATELY:   Following lower endoscopy (colonoscopy or flexible sigmoidoscopy):  Excessive amounts of blood in the stool  Significant tenderness or worsening of abdominal pains  Swelling of the abdomen that is new, acute  Fever of 100F or higher  For urgent or emergent issues, a gastroenterologist can be reached at any hour by calling [phone].  DIET:  We do recommend a small meal at first, but then you may proceed to your regular diet.  Drink plenty of fluids but you should avoid alcoholic beverages for 24 hours.  ACTIVITY:  You should plan to take it easy for the rest of today and you should NOT DRIVE or use heavy machinery until tomorrow (because of the sedation medicines used during the test).     FOLLOW UP: Our staff will call the number listed on your records the next business day following your procedure to check on you and address any questions or concerns that you may have regarding the information given to you following your procedure. If we do not reach you, we will leave a message.  However, if you are feeling well and you are not experiencing any problems, there is no need to return our call.  We will assume that you have returned to your regular daily activities without incident.  If any biopsies were taken you will be contacted by phone or by letter within the next 1-3 weeks.  Please call us at [phone] if you have not heard about the biopsies in 3 weeks.   SIGNATURES/CONFIDENTIALITY: You and/or your care partner have signed paperwork which will be entered into your electronic medical record.  These signatures attest to the fact that that the information above on your After Visit Summary has been reviewed and is understood.  Full responsibility of the confidentiality of this discharge information lies with you and/or your care-partner.  NO ASPIRIN, ASPIRIN CONTAINING PRODUCTS (BC OR GOODY POWDERS), OR NSAIDS (IBUPROFEN, ADVIL, ALEVE, MOTRIN) FOR 2 WEEKS, TYLENOL IS OK TO TAKE   Please read over handouts about polyps, diverticulosis, hemorrhoids and high fiber diets  Please continue your normal medications

## 2016-10-08 NOTE — Progress Notes (Signed)
Called to room to assist during endoscopic procedure.  Patient ID and intended procedure confirmed with present staff. Received instructions for my participation in the procedure from the performing physician.  

## 2016-10-09 ENCOUNTER — Telehealth: Payer: Self-pay

## 2016-10-09 NOTE — Telephone Encounter (Signed)
  Follow up Call-  Call back number 10/08/2016  Post procedure Call Back phone  # [phone]  Permission to leave phone message Yes  Some recent data might be hidden     Patient questions:  Do you have a fever, pain , or abdominal swelling? No. Pain Score  0 *  Have you tolerated food without any problems? Yes.    Have you been able to return to your normal activities? Yes.    Do you have any questions about your discharge instructions: Diet   No. Medications  No. Follow up visit  No.  Do you have questions or concerns about your Care? No  Actions: * If pain score is 4 or above: No action needed, pain <4.

## 2016-10-13 ENCOUNTER — Encounter: Payer: Self-pay | Admitting: Internal Medicine

## 2016-10-13 DIAGNOSIS — Z23 Encounter for immunization: Secondary | ICD-10-CM | POA: Diagnosis not present

## 2016-10-13 DIAGNOSIS — Z794 Long term (current) use of insulin: Secondary | ICD-10-CM | POA: Diagnosis not present

## 2016-10-13 DIAGNOSIS — E1142 Type 2 diabetes mellitus with diabetic polyneuropathy: Secondary | ICD-10-CM | POA: Diagnosis not present

## 2016-10-13 DIAGNOSIS — I10 Essential (primary) hypertension: Secondary | ICD-10-CM | POA: Diagnosis not present

## 2016-10-17 ENCOUNTER — Ambulatory Visit (INDEPENDENT_AMBULATORY_CARE_PROVIDER_SITE_OTHER): Payer: Medicare Other | Admitting: Podiatry

## 2016-10-17 DIAGNOSIS — E1140 Type 2 diabetes mellitus with diabetic neuropathy, unspecified: Secondary | ICD-10-CM

## 2016-10-17 DIAGNOSIS — B351 Tinea unguium: Secondary | ICD-10-CM | POA: Diagnosis not present

## 2016-10-17 DIAGNOSIS — M79676 Pain in unspecified toe(s): Secondary | ICD-10-CM

## 2016-10-17 NOTE — Progress Notes (Signed)
Subjective: 60 y.o. returns the office today for painful, elongated, thickened toenails which she cannot trim herself. Denies any redness or drainage around the nails. Denies any acute changes since last appointment and no new complaints today. Denies any systemic complaints such as fevers, chills, nausea, vomiting.   Objective: AAO 3, NAD DP/PT pulses palpable, CRT less than 3 seconds Sensation decreased with SWMF Nails hypertrophic, dystrophic, elongated, brittle, discolored 9. There is tenderness overlying the nails 1-5 bilaterally except the left hallux nail which has previously been removed. There is no surrounding erythema or drainage along the nail sites. No open lesions or pre-ulcerative lesions are identified. No other areas of tenderness bilateral lower extremities. No overlying edema, erythema, increased warmth. No pain with calf compression, swelling, warmth, erythema.  Assessment: Patient presents with symptomatic onychomycosis  Plan: -Treatment options including alternatives, risks, complications were discussed -Nails sharply debrided 9 without complication/bleeding. -CT scan results discussed; she has no other pain at this time except the toenails -Discussed daily foot inspection. If there are any changes, to call the office immediately.  -Follow-up in 3 months or sooner if any problems are to arise. In the meantime, encouraged to call the office with any questions, concerns, changes symptoms.  Celesta Gentile, DPM

## 2016-10-21 DIAGNOSIS — M47816 Spondylosis without myelopathy or radiculopathy, lumbar region: Secondary | ICD-10-CM | POA: Diagnosis not present

## 2016-10-24 ENCOUNTER — Emergency Department (HOSPITAL_COMMUNITY)
Admission: EM | Admit: 2016-10-24 | Discharge: 2016-10-24 | Disposition: A | Discharge status: Home / Self Care | Payer: Medicare Other | Attending: Emergency Medicine | Admitting: Emergency Medicine

## 2016-10-24 ENCOUNTER — Encounter (HOSPITAL_COMMUNITY): Payer: Self-pay | Admitting: Emergency Medicine

## 2016-10-24 ENCOUNTER — Emergency Department (HOSPITAL_COMMUNITY): Payer: Medicare Other

## 2016-10-24 DIAGNOSIS — Z79899 Other long term (current) drug therapy: Secondary | ICD-10-CM | POA: Diagnosis not present

## 2016-10-24 DIAGNOSIS — Z794 Long term (current) use of insulin: Secondary | ICD-10-CM | POA: Insufficient documentation

## 2016-10-24 DIAGNOSIS — S8991XA Unspecified injury of right lower leg, initial encounter: Secondary | ICD-10-CM | POA: Diagnosis present

## 2016-10-24 DIAGNOSIS — Y999 Unspecified external cause status: Secondary | ICD-10-CM | POA: Insufficient documentation

## 2016-10-24 DIAGNOSIS — E119 Type 2 diabetes mellitus without complications: Secondary | ICD-10-CM | POA: Insufficient documentation

## 2016-10-24 DIAGNOSIS — Y929 Unspecified place or not applicable: Secondary | ICD-10-CM | POA: Insufficient documentation

## 2016-10-24 DIAGNOSIS — M25561 Pain in right knee: Secondary | ICD-10-CM | POA: Diagnosis not present

## 2016-10-24 DIAGNOSIS — Z8673 Personal history of transient ischemic attack (TIA), and cerebral infarction without residual deficits: Secondary | ICD-10-CM | POA: Insufficient documentation

## 2016-10-24 DIAGNOSIS — S8001XA Contusion of right knee, initial encounter: Secondary | ICD-10-CM | POA: Insufficient documentation

## 2016-10-24 DIAGNOSIS — J449 Chronic obstructive pulmonary disease, unspecified: Secondary | ICD-10-CM | POA: Insufficient documentation

## 2016-10-24 DIAGNOSIS — I10 Essential (primary) hypertension: Secondary | ICD-10-CM | POA: Insufficient documentation

## 2016-10-24 DIAGNOSIS — W010XXA Fall on same level from slipping, tripping and stumbling without subsequent striking against object, initial encounter: Secondary | ICD-10-CM | POA: Diagnosis not present

## 2016-10-24 DIAGNOSIS — Z7982 Long term (current) use of aspirin: Secondary | ICD-10-CM | POA: Diagnosis not present

## 2016-10-24 DIAGNOSIS — Y939 Activity, unspecified: Secondary | ICD-10-CM | POA: Diagnosis not present

## 2016-10-24 DIAGNOSIS — F17210 Nicotine dependence, cigarettes, uncomplicated: Secondary | ICD-10-CM | POA: Insufficient documentation

## 2016-10-24 MED ORDER — IBUPROFEN 400 MG PO TABS: 400.0000 mg | ORAL_TABLET | Freq: Four times a day (QID) | ORAL | 0 refills | Status: DC | PRN

## 2016-10-24 NOTE — ED Provider Notes (Signed)
[place] DEPT Provider Note   CSN: [id number] Arrival date & time: 10/24/16  1146   By signing my name below, I, Neta Mends, attest that this documentation has been prepared under the direction and in the presence of Heath Lark, [place]. Electronically Signed: Neta Mends, ED Scribe. 10/24/2016. 12:15 PM.   History   Chief Complaint Chief Complaint  Patient presents with  . Fall    The history is provided by the patient. No language interpreter was used.   HPI Comments:  LAYONNA [name] is a 60 y.o. female who presents to the Emergency Department s/p fall that occurred 3 days ago. Pt reports that she was in a restaurant and slipped because the floor was slippery, landing on her right knee. Pt did not hit her head since she was able to brace her fall on another booth. Pt complains of worsening, constant right knee pain and notes a small abrasion. Pt describes the pain as "throbbing" and rates the pain at a 7/10. Pt states that she has been having difficulty moving her right knee. Pt has taken tylenol with little to no relief. Pt denies injury/trauma prior to the fall. Pt denies fever, chills, chest pain, SOB, head injury/LOC.    Past Medical History:  Diagnosis Date  . Anxiety    doesn't take any meds for this  . Arthritis    knuckles  . Asthma   . Claustrophobia 07/26/2012  . Complication of anesthesia 01/2012   reports she extubated herself  . COPD (chronic obstructive pulmonary disease) ([place])   . Diarrhea   . Dizziness    related to CSF leakage  . Headache(784.0)    related to CSF leakage  . History of chronic bronchitis 07/26/2012   "just about q winter"  . HTN (hypertension)    takes Amlodipine daily  . Hx of gout   . Hyperlipidemia    takes Pravastatin daily  . Kidney stones   . OSA (obstructive sleep apnea) 07/26/2012   denies CPAP  . Shortness of breath    "lying down; w/exertion; during heat"  . Sleep apnea    does not wear CPAP  .  Stroke [facility]) 03/2004   denies residual 07/26/2012  . Type II diabetes mellitus ([place])   . Urinary frequency   . Urinary urgency     Patient Active Problem List   Diagnosis Date Noted  . OSA (obstructive sleep apnea) 10/17/2014  . Dyspnea 03/16/2014  . Influenza-like illness 12/14/2013  . Right knee pain 10/22/2013  . Pain of finger of right hand 10/22/2013  . Breast complaint 10/22/2013  . Pain in joint, multiple sites 09/21/2013  . Carotid artery stenosis 06/03/2013  . Dizziness 02/16/2013  . Headache(784.0) 01/17/2013  . Depression 12/12/2012  . Tobacco abuse counseling 11/08/2012  . Weakness of left side of body 07/26/2012  . Uric acid kidney stone 07/04/2012  . CSF leak from nose 10/19/2011  . Low back pain radiating to both legs 10/19/2011  . DM (diabetes mellitus), type 2, uncontrolled ([place]) 08/31/2011  . HLD (hyperlipidemia) 07/02/2011  . Hypertension 06/20/2011  . Obesity 06/20/2011    Past Surgical History:  Procedure Laterality Date  . ESOPHAGOGASTRODUODENOSCOPY (EGD) WITH PROPOFOL N/A 07/13/2015   Procedure: ESOPHAGOGASTRODUODENOSCOPY (EGD) WITH PROPOFOL;  Surgeon: Carol Ada, MD;  Location: [facility];  Service: Endoscopy;  Laterality: N/A;  . NASAL SINUS SURGERY  01/29/2012   Procedure: ENDOSCOPIC SINUS SURGERY WITH STEALTH;  Surgeon: Ruby Cola, MD;  Location: [facility];  Service: ENT;  Laterality: N/A;  . NM MYOVIEW LTD  08/25/2011   Low risk study, no evidence of ischemia, EF 44%  . REFRACTIVE SURGERY  ~ 2010   left  . SPHENOIDECTOMY  01/29/2012   Procedure: SPHENOIDECTOMY;  Surgeon: Ruby Cola, MD;  Location: [facility];  Service: ENT;  Laterality: Left;  REPAIR OF LEFT SPHENOID    . TOTAL ABDOMINAL HYSTERECTOMY  1994    OB History    Gravida Para Term Preterm AB Living   0             SAB TAB Ectopic Multiple Live Births                   Home Medications    Prior to Admission medications   Medication Sig Start Date End Date Taking? Authorizing  Provider  albuterol (PROVENTIL HFA;VENTOLIN HFA) 108 (90 Base) MCG/ACT inhaler Inhale into the lungs. 05/28/16 05/28/17  Historical Provider, MD  amLODipine (NORVASC) 10 MG tablet Take 10 mg by mouth daily.  03/31/15   Historical Provider, MD  aspirin 325 MG buffered tablet Take 325 mg by mouth daily.    Historical Provider, MD  busPIRone (BUSPAR) 15 MG tablet TAKE 1 TABLET BY MOUTH 3 TIMES A DAY 06/17/16   Historical Provider, MD  CRESTOR 20 MG tablet Take 20 mg by mouth daily.  02/18/15   Historical Provider, MD  diazepam (VALIUM) 10 MG tablet  07/31/16   Historical Provider, MD  FLUoxetine (PROZAC) 10 MG capsule Take 4 capsules (40 mg total) by mouth at bedtime. 11/24/13   Kandis Nab, MD  gabapentin (NEURONTIN) 400 MG capsule Take 1 capsule (400 mg total) by mouth 3 (three) times daily. 06/19/14   Olin Hauser, DO  hydrochlorothiazide (HYDRODIURIL) 12.5 MG tablet Take 1 tablet (12.5 mg total) by mouth daily. 11/24/13   Kandis Nab, MD  ibuprofen (ADVIL,MOTRIN) 400 MG tablet Take 1 tablet (400 mg total) by mouth every 6 (six) hours as needed. 10/24/16   Stacie Templin Manuel Balta, [place]  insulin aspart (NOVOLOG) 100 UNIT/ML injection Inject into the skin.    Historical Provider, MD  Insulin Detemir (LEVEMIR FLEXPEN [place]) Inject into the skin.    Historical Provider, MD  insulin glargine (LANTUS) 100 UNIT/ML injection Inject 100-110 Units into the skin 2 (two) times daily.     Historical Provider, MD  NONFORMULARY OR COMPOUNDED [place]:  Authorized Substitute cream - Iburpfen 15%, Baclofen 1%, Gabapentin 3%, Lidocaine 2%, apply 1-2 grams to affected area 3-4 times daily. 08/08/16   Gardiner Barefoot, DPM  omeprazole (PRILOSEC) 40 MG capsule TAKE ONE CAPSULE BY MOUTH EVERY DAY 06/20/15   Historical Provider, MD  topiramate (TOPAMAX) 25 MG tablet Take one tablet by mouth nightly for 1 week, then increase to one tablet by mouth twice daily 11/25/12   Historical Provider, MD  valsartan  (DIOVAN) 80 MG tablet Take 1 tablet (80 mg total) by mouth daily. 07/01/13   Kandis Nab, MD    Family History Family History  Problem Relation Age of Onset  . Colon cancer Sister 52  . Breast cancer Mother 39  . Diabetes Sister 4  . Diabetes Sister 70  . Lung cancer Father   . Breast cancer Sister   . Cancer Sister     mouth  . Anesthesia problems Neg Hx   . Hypotension Neg Hx   . Malignant hyperthermia Neg Hx   . Pseudochol deficiency Neg Hx  Social History Social History  Substance Use Topics  . Smoking status: Current Every Day Smoker    Packs/day: 0.50    Years: 35.00    Types: Cigarettes  . Smokeless tobacco: Never Used     Comment: 07/26/2012 has decreased smoking from 2ppd; offered cessation materials; pt declines  . Alcohol use 0.6 oz/week    1 Glasses of wine per week     Comment: 3 beers monthly     Allergies   Atorvastatin; Metformin and related; Ace inhibitors; and Hydrocodone-acetaminophen   Review of Systems Review of Systems  Constitutional: Negative for chills and fever.  Respiratory: Negative for cough, chest tightness and shortness of breath.   Cardiovascular: Negative for chest pain and leg swelling.  Gastrointestinal: Negative for abdominal pain, constipation, diarrhea, nausea and vomiting.  Genitourinary: Negative for difficulty urinating.  Musculoskeletal: Positive for arthralgias and joint swelling.  Skin: Positive for wound.  Neurological: Negative for syncope and speech difficulty.  All other systems reviewed and are negative.    Physical Exam Updated Vital Signs BP 156/82 (BP Location: Left Arm)   Pulse 87   Temp 97.7 F (36.5 C) (Oral)   Resp 18   SpO2 96%   Physical Exam  Constitutional: She is oriented to person, place, and time. She appears well-developed and well-nourished. No distress.  HENT:  Head: Normocephalic and atraumatic.  Eyes: Conjunctivae and EOM are normal. Pupils are equal, round, and reactive to  light.  Neck: Normal range of motion. Neck supple.  Cardiovascular: Normal rate and regular rhythm.   Pulmonary/Chest: Effort normal and breath sounds normal. No respiratory distress.  Abdominal: She exhibits no distension.  Musculoskeletal: She exhibits tenderness (Right knee). She exhibits no deformity.       Right knee: She exhibits decreased range of motion (90 degrees). She exhibits no swelling, no ecchymosis, no erythema, normal alignment, no LCL laxity and no MCL laxity. Tenderness found. Medial joint line tenderness noted.       Legs: Neurological: She is alert and oriented to person, place, and time.  Skin: Skin is warm and dry.  Psychiatric: She has a normal mood and affect. Her behavior is normal.  Nursing note and vitals reviewed.    ED Treatments / Results  DIAGNOSTIC STUDIES:  Oxygen Saturation is 94% on RA, adequate by my interpretation.    COORDINATION OF CARE:  12:16 PM Discussed treatment plan with pt at bedside and pt agreed to plan.   Labs (all labs ordered are listed, but only abnormal results are displayed) Labs Reviewed - No data to display  EKG  EKG Interpretation None       Radiology Dg Knee Complete 4 Views Right  Result Date: 10/24/2016 CLINICAL DATA:  [place] 3-4 days ago and complains of right knee pain. Abrasion just inferior to the patella region. EXAM: RIGHT KNEE - COMPLETE 4+ VIEW COMPARISON:  10/31/2013 FINDINGS: Mild joint space narrowing in the medial knee compartment. Negative for a fracture or dislocation. Enthesopathic changes at the tibial tuberosity and superior aspect of the patella. No significant joint effusion. IMPRESSION: No acute abnormality. Electronically Signed   By: Markus Daft M.D.   On: 10/24/2016 12:15    Procedures Procedures (including critical care time)  Medications Ordered in ED Medications - No data to display   Initial Impression / Assessment and Plan / ED Course  I have reviewed the triage vital signs and the  nursing notes.  Pertinent labs & imaging results that were available during my  care of the patient were reviewed by me and considered in my medical decision making (see chart for details).  Clinical Course   Patient is a 60 year old female presenting with right knee pain s/p slip and fall x 3 days. Patient denies hitting her head. Patient reports throbbing pain about 7 on 10 and difficulty with her range of motion. Has taken Tylenol with little to no relief. Patient denies fever, chills, chest pain, shortness of breath, and head injury. Patient's right knee does have a small abrasion about 2cm. Knee is not erythematous or hot to the touch. Patient's right knee is TTP with positive McMurray sign and tenderness at medial joint line. Negative anterior drawer tests, posterior drawer test, valgus and varus stress, and negative homans sign. Patient is not tachycardic or tachypneic with good O2 saturation. Low suspicion for DVT or any infectious processes. Most likely right knee contusion and possible meniscus tear and Baker's cyst. Right knee x-ray is negative for dislocations, fractures, and significant joint effusion. Patient given a knee sleeve. Plan is pain management, RICE, and referral to orthopedic for further evaluation and treatment. Patient agrees to assessment and plan. Patient given appropriate discharge instructions. Return precautions given.   Final Clinical Impressions(s) / ED Diagnoses   Final diagnoses:  Contusion of right knee, initial encounter    New Prescriptions Discharge Medication List as of 10/24/2016  1:28 PM    START taking these medications   Details  ibuprofen (ADVIL,MOTRIN) 400 MG tablet Take 1 tablet (400 mg total) by mouth every 6 (six) hours as needed., Starting Fri 10/24/2016, Print      I personally performed the services described in this documentation, which was scribed in my presence. The recorded information has been reviewed and is accurate.    [place], [place] 10/24/16 1855    Carmin Muskrat, MD 10/25/16 2123

## 2016-10-24 NOTE — Discharge Instructions (Signed)
Contact a health care provider if: You have pain that gets worse. The cast, brace, or splint does not fit right. The cast, brace, or splint gets damaged. Get help right away if: You cannot use your injured joint to support any of your body weight (cannot bear weight). You cannot move the injured joint. You cannot walk more than a few steps without pain or without your knee buckling. You have significant pain, swelling, or numbness below the cast, brace, or splint.

## 2016-10-24 NOTE — ED Notes (Signed)
Ice applied to right knee.  

## 2016-10-24 NOTE — ED Triage Notes (Signed)
Per pt, states she fell on Tuesday-landed on right knee-states pain and slight swelling-small abrasion

## 2016-10-24 NOTE — ED Notes (Signed)
Bed: WTR6 Expected date:  Expected time:  Means of arrival:  Comments: 

## 2016-11-11 DIAGNOSIS — M4726 Other spondylosis with radiculopathy, lumbar region: Secondary | ICD-10-CM | POA: Diagnosis not present

## 2016-11-11 DIAGNOSIS — M545 Low back pain: Secondary | ICD-10-CM | POA: Diagnosis not present

## 2016-11-11 DIAGNOSIS — Z6841 Body Mass Index (BMI) 40.0 and over, adult: Secondary | ICD-10-CM | POA: Diagnosis not present

## 2016-11-11 DIAGNOSIS — M5136 Other intervertebral disc degeneration, lumbar region: Secondary | ICD-10-CM | POA: Diagnosis not present

## 2016-11-12 DIAGNOSIS — M25561 Pain in right knee: Secondary | ICD-10-CM | POA: Diagnosis not present

## 2016-11-20 DIAGNOSIS — M25561 Pain in right knee: Secondary | ICD-10-CM | POA: Diagnosis not present

## 2016-11-24 ENCOUNTER — Ambulatory Visit: Payer: Medicare Other | Admitting: Internal Medicine

## 2016-11-24 ENCOUNTER — Encounter: Payer: Self-pay | Admitting: *Deleted

## 2016-11-26 DIAGNOSIS — M47816 Spondylosis without myelopathy or radiculopathy, lumbar region: Secondary | ICD-10-CM | POA: Diagnosis not present

## 2016-11-26 DIAGNOSIS — M47817 Spondylosis without myelopathy or radiculopathy, lumbosacral region: Secondary | ICD-10-CM | POA: Diagnosis not present

## 2016-12-10 DIAGNOSIS — M25561 Pain in right knee: Secondary | ICD-10-CM | POA: Diagnosis not present

## 2016-12-16 ENCOUNTER — Other Ambulatory Visit: Payer: Self-pay | Admitting: Orthopaedic Surgery

## 2016-12-16 DIAGNOSIS — M25561 Pain in right knee: Secondary | ICD-10-CM

## 2016-12-23 ENCOUNTER — Ambulatory Visit
Admission: RE | Admit: 2016-12-23 | Discharge: 2016-12-23 | Disposition: A | Discharge status: Home / Self Care | Payer: Medicare Other | Source: Ambulatory Visit | Attending: Orthopaedic Surgery | Admitting: Orthopaedic Surgery

## 2016-12-23 DIAGNOSIS — M25561 Pain in right knee: Secondary | ICD-10-CM

## 2016-12-24 ENCOUNTER — Ambulatory Visit: Payer: Medicare Other | Admitting: Internal Medicine

## 2016-12-26 ENCOUNTER — Ambulatory Visit: Payer: Medicare Other | Admitting: Podiatry

## 2016-12-31 DIAGNOSIS — M25561 Pain in right knee: Secondary | ICD-10-CM | POA: Diagnosis not present

## 2016-12-31 DIAGNOSIS — M1711 Unilateral primary osteoarthritis, right knee: Secondary | ICD-10-CM | POA: Diagnosis not present

## 2017-01-02 ENCOUNTER — Ambulatory Visit (INDEPENDENT_AMBULATORY_CARE_PROVIDER_SITE_OTHER): Payer: Medicare Other | Admitting: Podiatry

## 2017-01-02 ENCOUNTER — Encounter: Payer: Self-pay | Admitting: Podiatry

## 2017-01-02 DIAGNOSIS — E1140 Type 2 diabetes mellitus with diabetic neuropathy, unspecified: Secondary | ICD-10-CM

## 2017-01-02 DIAGNOSIS — B351 Tinea unguium: Secondary | ICD-10-CM

## 2017-01-02 DIAGNOSIS — M79676 Pain in unspecified toe(s): Secondary | ICD-10-CM | POA: Diagnosis not present

## 2017-01-07 DIAGNOSIS — M1711 Unilateral primary osteoarthritis, right knee: Secondary | ICD-10-CM | POA: Diagnosis not present

## 2017-01-07 NOTE — Progress Notes (Signed)
Subjective: [name] returns the office today for painful, elongated, thickened toenails which she cannot trim herself. Denies any redness or drainage around the nails. Denies any acute changes since last appointment and no new complaints today. Denies any systemic complaints such as fevers, chills, nausea, vomiting.   Objective: AAO 3, NAD DP/PT pulses palpable, CRT less than 3 seconds Sensation decreased with SWMF Nails hypertrophic, dystrophic, elongated, brittle, discolored 9. There is tenderness overlying the nails 1-5 bilaterally except the left hallux nail which has previously been removed. There is no surrounding erythema or drainage along the nail sites. No open lesions or pre-ulcerative lesions are identified. No other areas of tenderness bilateral lower extremities. No overlying edema, erythema, increased warmth. No pain with calf compression, swelling, warmth, erythema.  Assessment: Patient presents with symptomatic onychomycosis  Plan: -Treatment options including alternatives, risks, complications were discussed -Nails sharply debrided 9 without complication/bleeding. -Discussed daily foot inspection. If there are any changes, to call the office immediately.  -Follow-up in 3 months or sooner if any problems are to arise. In the meantime, encouraged to call the office with any questions, concerns, changes symptoms.  Celesta Gentile, DPM

## 2017-01-08 DIAGNOSIS — M47816 Spondylosis without myelopathy or radiculopathy, lumbar region: Secondary | ICD-10-CM | POA: Diagnosis not present

## 2017-01-08 DIAGNOSIS — M545 Low back pain: Secondary | ICD-10-CM | POA: Diagnosis not present

## 2017-01-08 DIAGNOSIS — M5136 Other intervertebral disc degeneration, lumbar region: Secondary | ICD-10-CM | POA: Diagnosis not present

## 2017-01-14 DIAGNOSIS — M1711 Unilateral primary osteoarthritis, right knee: Secondary | ICD-10-CM | POA: Diagnosis not present

## 2017-01-21 DIAGNOSIS — M1711 Unilateral primary osteoarthritis, right knee: Secondary | ICD-10-CM | POA: Diagnosis not present

## 2017-01-21 DIAGNOSIS — M25561 Pain in right knee: Secondary | ICD-10-CM | POA: Diagnosis not present

## 2017-02-02 DIAGNOSIS — M1711 Unilateral primary osteoarthritis, right knee: Secondary | ICD-10-CM | POA: Diagnosis not present

## 2017-02-04 DIAGNOSIS — B353 Tinea pedis: Secondary | ICD-10-CM | POA: Diagnosis not present

## 2017-02-04 DIAGNOSIS — Z794 Long term (current) use of insulin: Secondary | ICD-10-CM | POA: Diagnosis not present

## 2017-02-04 DIAGNOSIS — R3915 Urgency of urination: Secondary | ICD-10-CM | POA: Diagnosis not present

## 2017-02-04 DIAGNOSIS — Z6841 Body Mass Index (BMI) 40.0 and over, adult: Secondary | ICD-10-CM | POA: Diagnosis not present

## 2017-02-04 DIAGNOSIS — E782 Mixed hyperlipidemia: Secondary | ICD-10-CM | POA: Diagnosis not present

## 2017-02-04 DIAGNOSIS — I10 Essential (primary) hypertension: Secondary | ICD-10-CM | POA: Diagnosis not present

## 2017-02-04 DIAGNOSIS — E1142 Type 2 diabetes mellitus with diabetic polyneuropathy: Secondary | ICD-10-CM | POA: Diagnosis not present

## 2017-02-11 ENCOUNTER — Encounter: Payer: Self-pay | Admitting: Internal Medicine

## 2017-02-11 ENCOUNTER — Ambulatory Visit (INDEPENDENT_AMBULATORY_CARE_PROVIDER_SITE_OTHER): Payer: Medicare Other | Admitting: Internal Medicine

## 2017-02-11 VITALS — BP 140/96 | HR 88 | Ht 62.0 in | Wt 252.0 lb

## 2017-02-11 DIAGNOSIS — Z86010 Personal history of colonic polyps: Secondary | ICD-10-CM

## 2017-02-11 DIAGNOSIS — R109 Unspecified abdominal pain: Secondary | ICD-10-CM

## 2017-02-11 MED ORDER — GLYCOPYRROLATE 2 MG PO TABS: 2.0000 mg | ORAL_TABLET | Freq: Two times a day (BID) | ORAL | 2 refills | Status: DC

## 2017-02-11 NOTE — Progress Notes (Signed)
Subjective:    Patient ID: [name], female    DOB: 01-01-1956, 61 y.o.   MRN: [id number]  HPI [name] is a 61 year old female with a past medical history of colon polyps, family history of colon cancer, obesity, hypertension, hyperlipidemia, kidney stones, diabetes, COPD who seen in follow-up. She is here today with her sister, [name]. I performed her elevated risk screening colonoscopy on 10/08/2016. This revealed benign polyps which were removed throughout the colon. There was a lipoma in the ascending colon. There were diverticulosis in the ascending and left colon. Internal hemorrhoids were found. 3 of these polyps were tubular adenoma, the distal polyps were hyperplastic.  She returns today for follow-up and to discuss mid abdominal cramping pain. She reports this cramping feels almost like menstrual cramping in her mid abdomen. Its reservoir basis. Doesn't seem to relate to food. Rarely associated with nausea but not vomiting. No relation the bowel movement. She does feel upper abdominal bloating and tightness. She has a prescription for omeprazole but reports she rarely uses it. She is not having issues with heartburn, dysphagia or odynophagia. Dr. Benson Norway performed an upper endoscopy in 2016 which was normal.  She reports her bowel movements a been regular without blood or melena. Bowel movements for her her often loose and associated with eating. This is not a change for her.  Review of Systems As per history of present illness, otherwise negative  Current Medications, Allergies, Past Medical History, Past Surgical History, Family History and Social History were reviewed in [employer] record.     Objective:   Physical Exam BP (!) 140/96   Pulse 88   Ht 5\' 2"  (1.575 m)   Wt 252 lb (114.3 kg)   BMI 46.09 kg/m  Constitutional: Well-developed and well-nourished. No distress. HEENT: Normocephalic and atraumatic. Oropharynx is clear and moist. No  oropharyngeal exudate. Conjunctivae are normal.  No scleral icterus. Neck: Neck supple. Trachea midline. Cardiovascular: Normal rate, regular rhythm and intact distal pulses. No M/R/G Pulmonary/chest: Effort normal and breath sounds normal. No wheezing, rales or rhonchi. Abdominal: Soft, obese, diffusely tender without rebound or guarding. Bowel sounds active throughout.  Extremities: no clubbing, cyanosis, or edema Neurological: Alert and oriented to person place and time. Skin: Skin is warm and dry.  Psychiatric: Normal mood and affect. Behavior is normal.  CBC normal Feb 2017 CMP Feb 2018 within normal limits except for glucose elevated 176. AST 18, ALT 16, total bili 0.5, alkaline phosphatase 106. Albumin 4.0 Hemoglobin A1c 10.9 Microalbumin/creatinine ratio very elevated      Assessment & Plan:  61 year old female with a past medical history of colon polyps, family history of colon cancer, obesity, hypertension, hyperlipidemia, kidney stones, diabetes, COPD who seen in follow-up.  1. Mid abdominal cramping pain -- etiology not completely clear. Could be irritable bowel type symptom. Upper endoscopy unremarkable in 2016. Colonoscopy with polypectomy but no colitis or source for abdominal pain. Pain not consistent with diverticulitis. I recommended abdominal ultrasound to rule out gallstones. I would like her to use her omeprazole 20 mg on a daily basis for 2-4 weeks to see if pain improves. I'm prescribing Robinul Forte 2 mg to take every 12 hours as an anti-spasmodic. I asked that she call me in early April to let me know if symptoms have improved and if not consider gastric emptying study to evaluate for gastroparesis. She is at risk for gastroparesis with diabetes with frequent hyperglycemia. Her sister also has gastroparesis.  2.  History of colon polyps -- surveillance colonoscopy recommended in 3 years which would be November 2020  3. Proteinuria -- she has been referred to  nephrology. I suspect this is directly related to her diabetes.  25 minutes spent with the patient today. Greater than 50% was spent in counseling and coordination of care with the patient

## 2017-02-11 NOTE — Patient Instructions (Signed)
You have been scheduled for an abdominal ultrasound at [facility] (1st floor of hospital) on Tuesday, 02/16/17 at 8:00 am. Please arrive 15 minutes prior to your appointment for registration. Make certain not to have anything to eat or drink 6 hours prior to your appointment. Should you need to reschedule your appointment, please contact radiology at [phone]. This test typically takes about 30 minutes to perform.  Please take your omeprazole every day x 2 weeks.  We have sent the following medications to your pharmacy for you to pick up at your convenience: Robinul Forte every 12 hours   Please call our office around April 2nd to let us know how you are feeling.  Follow up in the office in around 3 months.  If you are age 25 or older, your body mass index should be between 23-30. Your Body mass index is 46.09 kg/m. If this is out of the aforementioned range listed, please consider follow up with your Primary Care Provider.  If you are age 62 or younger, your body mass index should be between 19-25. Your Body mass index is 46.09 kg/m. If this is out of the aformentioned range listed, please consider follow up with your Primary Care Provider.

## 2017-02-17 ENCOUNTER — Ambulatory Visit (HOSPITAL_COMMUNITY): Payer: Medicare Other

## 2017-02-19 DIAGNOSIS — M47816 Spondylosis without myelopathy or radiculopathy, lumbar region: Secondary | ICD-10-CM | POA: Diagnosis not present

## 2017-02-19 DIAGNOSIS — M545 Low back pain: Secondary | ICD-10-CM | POA: Diagnosis not present

## 2017-02-19 DIAGNOSIS — M5136 Other intervertebral disc degeneration, lumbar region: Secondary | ICD-10-CM | POA: Diagnosis not present

## 2017-02-20 ENCOUNTER — Ambulatory Visit (HOSPITAL_COMMUNITY)
Admission: RE | Admit: 2017-02-20 | Discharge: 2017-02-20 | Disposition: A | Discharge status: Home / Self Care | Payer: Medicare Other | Source: Ambulatory Visit | Attending: Internal Medicine | Admitting: Internal Medicine

## 2017-02-20 DIAGNOSIS — R109 Unspecified abdominal pain: Secondary | ICD-10-CM | POA: Insufficient documentation

## 2017-02-20 DIAGNOSIS — R935 Abnormal findings on diagnostic imaging of other abdominal regions, including retroperitoneum: Secondary | ICD-10-CM | POA: Diagnosis not present

## 2017-03-05 DIAGNOSIS — Z6841 Body Mass Index (BMI) 40.0 and over, adult: Secondary | ICD-10-CM | POA: Diagnosis not present

## 2017-03-05 DIAGNOSIS — I639 Cerebral infarction, unspecified: Secondary | ICD-10-CM | POA: Diagnosis not present

## 2017-03-05 DIAGNOSIS — E1129 Type 2 diabetes mellitus with other diabetic kidney complication: Secondary | ICD-10-CM | POA: Diagnosis not present

## 2017-03-05 DIAGNOSIS — N182 Chronic kidney disease, stage 2 (mild): Secondary | ICD-10-CM | POA: Diagnosis not present

## 2017-03-05 DIAGNOSIS — R600 Localized edema: Secondary | ICD-10-CM | POA: Diagnosis not present

## 2017-03-05 DIAGNOSIS — I129 Hypertensive chronic kidney disease with stage 1 through stage 4 chronic kidney disease, or unspecified chronic kidney disease: Secondary | ICD-10-CM | POA: Diagnosis not present

## 2017-03-11 ENCOUNTER — Other Ambulatory Visit: Payer: Self-pay | Admitting: Nephrology

## 2017-03-11 ENCOUNTER — Telehealth: Payer: Self-pay | Admitting: Internal Medicine

## 2017-03-11 DIAGNOSIS — N183 Chronic kidney disease, stage 3 unspecified: Secondary | ICD-10-CM

## 2017-03-11 NOTE — Telephone Encounter (Signed)
Proceed with gastric emptying scan GI pathogen panel for diarrhea

## 2017-03-11 NOTE — Telephone Encounter (Signed)
Patient calling back with an update of her symptoms. States her stomach still hurts, the pain is constant and she has also had some nausea but no vomiting. States she has also had bad diarrhea for about a week, having 6-7 liquid stools/day, no blood in the stool. Please advise.

## 2017-03-12 ENCOUNTER — Encounter: Payer: Self-pay | Admitting: Internal Medicine

## 2017-03-12 ENCOUNTER — Other Ambulatory Visit: Payer: Self-pay

## 2017-03-12 ENCOUNTER — Ambulatory Visit (INDEPENDENT_AMBULATORY_CARE_PROVIDER_SITE_OTHER): Payer: Medicare Other | Admitting: Internal Medicine

## 2017-03-12 VITALS — BP 150/94 | HR 83 | Ht 63.0 in | Wt 248.0 lb

## 2017-03-12 DIAGNOSIS — R110 Nausea: Secondary | ICD-10-CM

## 2017-03-12 DIAGNOSIS — E1165 Type 2 diabetes mellitus with hyperglycemia: Secondary | ICD-10-CM | POA: Diagnosis not present

## 2017-03-12 DIAGNOSIS — R197 Diarrhea, unspecified: Secondary | ICD-10-CM

## 2017-03-12 DIAGNOSIS — E1159 Type 2 diabetes mellitus with other circulatory complications: Secondary | ICD-10-CM | POA: Diagnosis not present

## 2017-03-12 DIAGNOSIS — R1013 Epigastric pain: Secondary | ICD-10-CM

## 2017-03-12 MED ORDER — INSULIN ASPART 100 UNIT/ML FLEXPEN: 30.0000 [IU] | PEN_INJECTOR | Freq: Three times a day (TID) | SUBCUTANEOUS | 5 refills | Status: DC

## 2017-03-12 MED ORDER — INSULIN GLARGINE 100 UNIT/ML SOLOSTAR PEN: 90.0000 [IU] | PEN_INJECTOR | Freq: Two times a day (BID) | SUBCUTANEOUS | 5 refills | Status: DC

## 2017-03-12 NOTE — Patient Instructions (Addendum)
Please change: - Lantus 90 units 2x a day, but move the evening dose after dinner. - Novolog: 30 units before a smaller meal 40 units before a larger meal Try to eat something for b'fast every day.  Please let me know if the sugars are consistently <80 or >200.  Please return on May 3rd at 2:20 pm with your sugar log.   PATIENT INSTRUCTIONS FOR TYPE 2 DIABETES:  DIET AND EXERCISE Diet and exercise is an important part of diabetic treatment.  We recommended aerobic exercise in the form of brisk walking (working between 40-60% of maximal aerobic capacity, similar to brisk walking) for 150 minutes per week (such as 30 minutes five days per week) along with 3 times per week performing 'resistance' training (using various gauge rubber tubes with handles) 5-10 exercises involving the major muscle groups (upper body, lower body and core) performing 10-15 repetitions (or near fatigue) each exercise. Start at half the above goal but build slowly to reach the above goals. If limited by weight, joint pain, or disability, we recommend daily walking in a swimming pool with water up to waist to reduce pressure from joints while allow for adequate exercise.    BLOOD GLUCOSES Monitoring your blood glucoses is important for continued management of your diabetes. Please check your blood glucoses 2-4 times a day: fasting, before meals and at bedtime (you can rotate these measurements - e.g. one day check before the 3 meals, the next day check before 2 of the meals and before bedtime, etc.).   HYPOGLYCEMIA (low blood sugar) Hypoglycemia is usually a reaction to not eating, exercising, or taking too much insulin/ other diabetes drugs.  Symptoms include tremors, sweating, hunger, confusion, headache, etc. Treat IMMEDIATELY with 15 grams of Carbs: . 4 glucose tablets .  cup regular juice/soda . 2 tablespoons raisins . 4 teaspoons sugar . 1 tablespoon honey Recheck blood glucose in 15 mins and repeat above if  still symptomatic/blood glucose <100.  RECOMMENDATIONS TO REDUCE YOUR RISK OF DIABETIC COMPLICATIONS: * Take your prescribed MEDICATION(S) * Follow a DIABETIC diet: Complex carbs, fiber rich foods, (monounsaturated and polyunsaturated) fats * AVOID saturated/trans fats, high fat foods, >2,300 mg salt per day. * EXERCISE at least 5 times a week for 30 minutes or preferably daily.  * DO NOT SMOKE OR DRINK more than 1 drink a day. * Check your FEET every day. Do not wear tightfitting shoes. Contact us if you develop an ulcer * See your EYE doctor once a year or more if needed * Get a FLU shot once a year * Get a PNEUMONIA vaccine once before and once after age 42 years  GOALS:  * Your Hemoglobin A1c of <7%  * fasting sugars need to be <130 * after meals sugars need to be <180 (2h after you start eating) * Your Systolic BP should be 194 or lower  * Your Diastolic BP should be 80 or lower  * Your HDL (Good Cholesterol) should be 40 or higher  * Your LDL (Bad Cholesterol) should be 100 or lower. * Your Triglycerides should be 150 or lower  * Your Urine microalbumin (kidney function) should be <30 * Your Body Mass Index should be 25 or lower    Please consider the following ways to cut down carbs and fat and increase fiber and micronutrients in your diet: - substitute whole grain for white bread or pasta - substitute brown rice for white rice - substitute 90-calorie flat bread pieces for slices  of bread when possible - substitute sweet potatoes or yams for white potatoes - substitute humus for margarine - substitute tofu for cheese when possible - substitute almond or rice milk for regular milk (would not drink soy milk daily due to concern for soy estrogen influence on breast cancer risk) - substitute dark chocolate for other sweets when possible - substitute water - can add lemon or orange slices for taste - for diet sodas (artificial sweeteners will trick your body that you can eat  sweets without getting calories and will lead you to overeating and weight gain in the long run) - do not skip breakfast or other meals (this will slow down the metabolism and will result in more weight gain over time)  - can try smoothies made from fruit and almond/rice milk in am instead of regular breakfast - can also try old-fashioned (not instant) oatmeal made with almond/rice milk in am - order the dressing on the side when eating salad at a restaurant (pour less than half of the dressing on the salad) - eat as little meat as possible - can try juicing, but should not forget that juicing will get rid of the fiber, so would alternate with eating raw veg./fruits or drinking smoothies - use as little oil as possible, even when using olive oil - can dress a salad with a mix of balsamic vinegar and lemon juice, for e.g. - use agave nectar, stevia sugar, or regular sugar rather than artificial sweateners - steam or broil/roast veggies  - snack on veggies/fruit/nuts (unsalted, preferably) when possible, rather than processed foods - reduce or eliminate aspartame in diet (it is in diet sodas, chewing gum, etc) Read the labels!  Try to read Dr. Janene Harvey book: "Program for Reversing Diabetes" for other ideas for healthy eating.

## 2017-03-12 NOTE — Progress Notes (Signed)
Patient ID: [name], female   DOB: Sep 10, 1956, 61 y.o.   MRN: [id number]   HPI: [name] is a 61 y.o.-year-old female, referred by her PCP, Angel Nutting, DO (he left practice since) for management of DM2, dx in 2014, insulin-dependent since 2016, uncontrolled, with complications (carotid artery stenosis - s/p CVA 2005, 2012, CKD, PN).  Last hemoglobin A1c was: 02/04/2017: HbA1c 10.9% 10/13/2016: HbA1c 11.2% 05/06/2016: HbA1c 10.8% 01/10/2016: HbA1c 9.5% Lab Results  Component Value Date   HGBA1C 7.6 06/19/2014   HGBA1C 12.0 12/23/2013   HGBA1C 10.4 09/19/2013   Pt is on a regimen of: - Lantus 110 units in a.m. and 110 units in hs (misses night dose as she falls asleep) - Novolog 30 units 3x a day, before meals (misses it once a day and skips it in am as she does not eat b'fast) Metformin caused N/V. She would not want to restart.  Pt checks her sugars 2-3x a day and they are: - am: 220-331 - 2h after b'fast: n/c - before lunch: n/c - 2h after lunch: n/c - before dinner: 220-300 - 2h after dinner: n/c - bedtime: 230s, 383 - nighttime: n/c No lows. Lowest sugar was 220 lately she has hypoglycemia awareness at 120.  Highest sugar was 400s.  Glucometer: Accu-Chek Aviva  Pt's meals are: - Breakfast: skips - Lunch: soup or sandwich - Dinner: chicken, rice or baked, greens - Snacks:maybe 1 a day: fruit, chips She will see nutrition soon.  - + CKD, last BUN/creatinine:  02/04/2017: 13/0.93 (GFR 67) Lab Results  Component Value Date   BUN 16 03/15/2014   BUN 11 12/23/2013   CREATININE 1.11 (H) 03/15/2014   CREATININE 1.04 12/23/2013  02/04/2017: ACR 995, previously (10/13/2016): 600. She started to see [name] ([facility]). On valsartan 40. - She has a history of hyperlipidemia; last set of lipids: 02/04/2017: 282/357/40/171 Lab Results  Component Value Date   CHOL 216 (H) 12/23/2013   HDL 41 12/23/2013   LDLCALC 140 (H) 12/23/2013   LDLDIRECT 203 (H)  06/20/2011   TRIG 177 (H) 12/23/2013   CHOLHDL 5.3 12/23/2013  On Crestor 40 - but was not taking Crestor consistently at the time of the last lipid panel - last eye exam was in Spring 2017. No DR.  - no numbness and tingling in her feet.  Pt has FH of DM in 2 sisters, father.  She also has a history of HTN, HL, gout, kidney stones.  ROS: Constitutional: + weight gain, + fatigue, + subjective hyperthermia Eyes: no blurry vision, no xerophthalmia ENT: no sore throat, no nodules palpated in throat, no dysphagia/odynophagia, no hoarseness Cardiovascular: no CP/SOB/palpitations/+ hand then leg swelling Respiratory: + cough/no SOB Gastrointestinal: + Nausea, + diarrhea, no acid reflux Musculoskeletal: + Both: muscle/joint aches Skin: no rashes Neurological: no tremors/numbness/tingling/dizziness Psychiatric: no depression/+ anxiety + low libido  Past Medical History:  Diagnosis Date  . Anxiety    doesn't take any meds for this  . Arthritis    knuckles  . Asthma   . Claustrophobia 07/26/2012  . Complication of anesthesia 01/2012   reports she extubated herself  . COPD (chronic obstructive pulmonary disease) ([place])   . Diarrhea   . Diverticulosis   . Dizziness    related to CSF leakage  . Headache(784.0)    related to CSF leakage  . History of chronic bronchitis 07/26/2012   "just about q winter"  . HTN (hypertension)    takes Amlodipine daily  .  Hx of gout   . Hyperlipidemia    takes Pravastatin daily  . Internal hemorrhoids   . Kidney stones   . OSA (obstructive sleep apnea) 07/26/2012   denies CPAP  . Shortness of breath    "lying down; w/exertion; during heat"  . Sleep apnea    does not wear CPAP  . Stroke [facility]) 03/2004   denies residual 07/26/2012  . Type II diabetes mellitus ([place])   . Urinary frequency   . Urinary urgency    Past Surgical History:  Procedure Laterality Date  . ESOPHAGOGASTRODUODENOSCOPY (EGD) WITH PROPOFOL N/A 07/13/2015   Procedure:  ESOPHAGOGASTRODUODENOSCOPY (EGD) WITH PROPOFOL;  Surgeon: Carol Ada, MD;  Location: [facility];  Service: Endoscopy;  Laterality: N/A;  . NASAL SINUS SURGERY  01/29/2012   Procedure: ENDOSCOPIC SINUS SURGERY WITH STEALTH;  Surgeon: Ruby Cola, MD;  Location: [place];  Service: ENT;  Laterality: N/A;  . NM MYOVIEW LTD  08/25/2011   Low risk study, no evidence of ischemia, EF 44%  . REFRACTIVE SURGERY  ~ 2010   left  . SPHENOIDECTOMY  01/29/2012   Procedure: SPHENOIDECTOMY;  Surgeon: Ruby Cola, MD;  Location: [place];  Service: ENT;  Laterality: Left;  REPAIR OF LEFT SPHENOID    . TOTAL ABDOMINAL HYSTERECTOMY  1994   Social History   Social History  . Marital status: Single    Spouse name: N/A  . Number of children: 0   Occupational History  . disabled    Social History Main Topics  . Smoking status: Current Every Day Smoker    Packs/day: 0.50    Years: 35.00    Types: Cigarettes  . Smokeless tobacco: Never Used     Comment: 07/26/2012 has decreased smoking from 2ppd; offered cessation materials; pt declines  . Alcohol use Wine, every 2 or 3 weeks, 2 drinks   . Drug use: No   Social History Narrative   Currently unemployed.  Really wants to find a job, but has been difficult for her.  No children, not married   Current Outpatient Prescriptions on File Prior to Visit  Medication Sig Dispense Refill  . albuterol (PROVENTIL HFA;VENTOLIN HFA) 108 (90 Base) MCG/ACT inhaler Inhale into the lungs.    Marland Kitchen amLODipine (NORVASC) 10 MG tablet Take 10 mg by mouth daily.     Marland Kitchen aspirin 325 MG buffered tablet Take 325 mg by mouth daily.    . busPIRone (BUSPAR) 15 MG tablet TAKE 1 TABLET BY MOUTH 3 TIMES A DAY    . CRESTOR 20 MG tablet Take 20 mg by mouth daily.     . diazepam (VALIUM) 10 MG tablet     . FLUoxetine (PROZAC) 10 MG capsule Take 4 capsules (40 mg total) by mouth at bedtime. 120 capsule 3  . gabapentin (NEURONTIN) 400 MG capsule Take 1 capsule (400 mg total) by mouth 3 (three)  times daily. 90 capsule 4  . glycopyrrolate (ROBINUL) 2 MG tablet Take 1 tablet (2 mg total) by mouth 2 (two) times daily. 60 tablet 2  . hydrochlorothiazide (HYDRODIURIL) 12.5 MG tablet Take 1 tablet (12.5 mg total) by mouth daily. 30 tablet 3  . valsartan (DIOVAN) 80 MG tablet Take 1 tablet (80 mg total) by mouth daily. 30 tablet 3  . ibuprofen (ADVIL,MOTRIN) 400 MG tablet Take 1 tablet (400 mg total) by mouth every 6 (six) hours as needed. (Patient not taking: Reported on 03/12/2017) 12 tablet 0  . NONFORMULARY OR COMPOUNDED [place]:  Authorized  Substitute cream - Iburpfen 15%, Baclofen 1%, Gabapentin 3%, Lidocaine 2%, apply 1-2 grams to affected area 3-4 times daily. (Patient not taking: Reported on 03/12/2017) 120 each 11  . omeprazole (PRILOSEC) 40 MG capsule TAKE ONE CAPSULE BY MOUTH EVERY DAY    . topiramate (TOPAMAX) 25 MG tablet Take one tablet by mouth nightly for 1 week, then increase to one tablet by mouth twice daily     No current facility-administered medications on file prior to visit.    Allergies  Allergen Reactions  . Atorvastatin Other (See Comments) and Nausea And Vomiting    "legs cramped; moderate to almost severe"  . Metformin And Related Nausea And Vomiting  . Ace Inhibitors Cough and Nausea And Vomiting  . Hydrocodone-Acetaminophen Nausea And Vomiting   Family History  Problem Relation Age of Onset  . Colon cancer Sister 68  . Breast cancer Mother 52  . Diabetes Sister 26  . Diabetes Sister 78  . Lung cancer Father   . Breast cancer Sister   . Cancer Sister     mouth  . Anesthesia problems Neg Hx   . Hypotension Neg Hx   . Malignant hyperthermia Neg Hx   . Pseudochol deficiency Neg Hx     PE: BP (!) 150/94 (BP Location: Left Arm, Patient Position: Sitting)   Pulse 83   Ht 5\' 3"  (1.6 m)   Wt 248 lb (112.5 kg)   SpO2 95%   BMI 43.93 kg/m  Wt Readings from Last 3 Encounters:  03/12/17 248 lb (112.5 kg)  02/11/17 252 lb (114.3 kg)   10/08/16 248 lb (112.5 kg)   Constitutional: Obese, in NAD Eyes: PERRLA, EOMI, no exophthalmos ENT: moist mucous membranes, no thyromegaly, no cervical lymphadenopathy Cardiovascular: RRR, No MRG, + swelling in bilateral hands Respiratory: CTA B Gastrointestinal: abdomen soft, NT, ND, BS+ Musculoskeletal: no deformities, strength intact in all 4 Skin: + Dry, warm, no rashes except palmar erythema Neurological: no tremor with outstretched hands, DTR normal in all 4  ASSESSMENT: 1. DM2, insulin-dependent, uncontrolled, with complications -  carotid artery stenosis, s/p CVA - CKD - PN  PLAN:  1. Patient with long-standing, uncontrolled diabetes, on large doses of basal and mealtime insulin, with which, however, she is noncompliant. We discussed about the fact that she absolutely needs to take her insulin doses, but we will try to optimize these so that she is less likely to miss doses. I advised her to move her evening Lantus after dinner, rather than at bedtime. We also discussed about the importance of eating breakfast and bolusing NovoLog before this meal. I will also give her a more flexible regimen of NovoLog, and I believe that she needs higher doses. Ultimately, the best way to treat her diabetes is to improve her diet. I suggested a plant-based diet, however, she did not seem very enthusiastic about it. She does have an appointment with the nutritionist soon. We did discuss about the possible re-addition of metformin, but she does not want to restart this. I'm not sure if other medicines are covered by her insurance, but we can try a GLP-1 receptor agonist at next visit. May need U500 insulin in the near future, if her sugars do not improve. - I suggested to:  Patient Instructions  Please change: - Lantus 90 units 2x a day, but move the evening dose after dinner. - Novolog: 30 units before a smaller meal 40 units before a larger meal Try to eat something for b'fast every  day.  Please let me know if the sugars are consistently <80 or >200.  Please return on May 3rd at 2:20 pm with your sugar log.   - Strongly advised her to start checking sugars at different times of the day - check 3 times a day, rotating checks - given sugar log and advised how to fill it and to bring it at next appt  - given foot care handout and explained the principles  - given instructions for hypoglycemia management "15-15 rule"  - advised for yearly eye exams  - Return to clinic in 1.5 mo with sugar log   Philemon Kingdom, MD PhD [facility] Endocrinology

## 2017-03-12 NOTE — Telephone Encounter (Signed)
Patient agrees with this plan. She will come to our lab tomorrow and pick up her stool containers. She will go to the [facility] Friday April 13th at 7:15 am. She is instructed to hold her GI medications and arrive fasting. I also explained this test is 4 hours long.

## 2017-03-18 DIAGNOSIS — M1711 Unilateral primary osteoarthritis, right knee: Secondary | ICD-10-CM | POA: Diagnosis not present

## 2017-03-20 ENCOUNTER — Ambulatory Visit (HOSPITAL_COMMUNITY)
Admission: RE | Admit: 2017-03-20 | Discharge: 2017-03-20 | Disposition: A | Discharge status: Home / Self Care | Payer: Medicare Other | Source: Ambulatory Visit | Attending: Internal Medicine | Admitting: Internal Medicine

## 2017-03-20 DIAGNOSIS — R1013 Epigastric pain: Secondary | ICD-10-CM | POA: Diagnosis not present

## 2017-03-20 DIAGNOSIS — R197 Diarrhea, unspecified: Secondary | ICD-10-CM

## 2017-03-20 DIAGNOSIS — R110 Nausea: Secondary | ICD-10-CM | POA: Diagnosis not present

## 2017-03-20 MED ORDER — TECHNETIUM TC 99M SULFUR COLLOID: 2.0000 | Freq: Once | INTRAVENOUS | Status: DC | PRN

## 2017-03-24 ENCOUNTER — Ambulatory Visit: Payer: Medicare Other | Admitting: *Deleted

## 2017-03-30 ENCOUNTER — Other Ambulatory Visit: Payer: Medicare Other

## 2017-03-31 ENCOUNTER — Ambulatory Visit
Admission: RE | Admit: 2017-03-31 | Discharge: 2017-03-31 | Disposition: A | Discharge status: Home / Self Care | Payer: Medicare Other | Source: Ambulatory Visit | Attending: Nephrology | Admitting: Nephrology

## 2017-03-31 ENCOUNTER — Other Ambulatory Visit: Payer: Medicare Other

## 2017-03-31 DIAGNOSIS — N183 Chronic kidney disease, stage 3 unspecified: Secondary | ICD-10-CM

## 2017-04-03 ENCOUNTER — Encounter: Payer: Self-pay | Admitting: Podiatry

## 2017-04-03 ENCOUNTER — Ambulatory Visit (INDEPENDENT_AMBULATORY_CARE_PROVIDER_SITE_OTHER): Payer: Medicare Other | Admitting: Podiatry

## 2017-04-03 DIAGNOSIS — E1140 Type 2 diabetes mellitus with diabetic neuropathy, unspecified: Secondary | ICD-10-CM | POA: Diagnosis not present

## 2017-04-03 DIAGNOSIS — B351 Tinea unguium: Secondary | ICD-10-CM

## 2017-04-03 DIAGNOSIS — M79676 Pain in unspecified toe(s): Secondary | ICD-10-CM

## 2017-04-05 NOTE — Progress Notes (Signed)
Subjective: Ms. Borunda returns the office today for painful, elongated, thickened toenails which she cannot trim herself. Denies any redness or drainage around the nails. Denies any acute changes since last appointment and no new complaints today. Denies any systemic complaints such as fevers, chills, nausea, vomiting.   Objective: AAO 3, NAD DP/PT pulses palpable, CRT less than 3 seconds Sensation decreased with SWMF Nails hypertrophic, dystrophic, elongated, brittle, discolored 9. There is tenderness overlying the nails 1-5 bilaterally except the left hallux nail which has previously been removed. There is no surrounding erythema or drainage along the nail sites. Small pieces of nail and the corners of the left hallux toenail does cause pain at time at times. No open lesions or pre-ulcerative lesions are identified. No other areas of tenderness bilateral lower extremities. No overlying edema, erythema, increased warmth. No pain with calf compression, swelling, warmth, erythema.  Assessment: Patient presents with symptomatic onychomycosis with some residual nail within the left hallux toenail.;   Plan: -Treatment options including alternatives, risks, complications were discussed -Nails sharply debrided 9 without complication/bleeding. -Discussed the symptoms continue left hallux week and repeat the procedure to try to remove the small piece of the nail corners which are coming back. She will likely do this in the future which is upcoming vacation planned.  -Discussed daily foot inspection. If there are any changes, to call the office immediately.  -Follow-up in 3 months or sooner if any problems are to arise. In the meantime, encouraged to call the office with any questions, concerns, changes symptoms.  Celesta Gentile, DPM

## 2017-04-15 DIAGNOSIS — M1711 Unilateral primary osteoarthritis, right knee: Secondary | ICD-10-CM | POA: Diagnosis not present

## 2017-04-23 ENCOUNTER — Ambulatory Visit: Payer: Medicare Other | Admitting: Internal Medicine

## 2017-05-01 ENCOUNTER — Other Ambulatory Visit: Payer: Self-pay

## 2017-05-01 MED ORDER — GLYCOPYRROLATE 2 MG PO TABS: 2.0000 mg | ORAL_TABLET | Freq: Two times a day (BID) | ORAL | 0 refills | Status: DC

## 2017-05-06 DIAGNOSIS — G960 Cerebrospinal fluid leak: Secondary | ICD-10-CM | POA: Diagnosis not present

## 2017-05-06 DIAGNOSIS — I10 Essential (primary) hypertension: Secondary | ICD-10-CM | POA: Diagnosis not present

## 2017-05-13 DIAGNOSIS — M1711 Unilateral primary osteoarthritis, right knee: Secondary | ICD-10-CM | POA: Diagnosis not present

## 2017-05-20 DIAGNOSIS — Z1329 Encounter for screening for other suspected endocrine disorder: Secondary | ICD-10-CM | POA: Diagnosis not present

## 2017-05-20 DIAGNOSIS — E782 Mixed hyperlipidemia: Secondary | ICD-10-CM | POA: Diagnosis not present

## 2017-05-20 DIAGNOSIS — I10 Essential (primary) hypertension: Secondary | ICD-10-CM | POA: Diagnosis not present

## 2017-05-20 DIAGNOSIS — Z6841 Body Mass Index (BMI) 40.0 and over, adult: Secondary | ICD-10-CM | POA: Diagnosis not present

## 2017-05-20 DIAGNOSIS — E1142 Type 2 diabetes mellitus with diabetic polyneuropathy: Secondary | ICD-10-CM | POA: Diagnosis not present

## 2017-05-20 DIAGNOSIS — Z794 Long term (current) use of insulin: Secondary | ICD-10-CM | POA: Diagnosis not present

## 2017-05-22 ENCOUNTER — Telehealth: Payer: Self-pay | Admitting: Internal Medicine

## 2017-05-22 ENCOUNTER — Other Ambulatory Visit: Payer: Self-pay

## 2017-05-22 MED ORDER — INSULIN GLARGINE 100 UNIT/ML SOLOSTAR PEN: 90.0000 [IU] | PEN_INJECTOR | Freq: Two times a day (BID) | SUBCUTANEOUS | 5 refills | Status: DC

## 2017-05-22 NOTE — Telephone Encounter (Signed)
**  Remind patient they can make refill requests via MyChart**  Medication refill request (Name & Dosage): Insulin Glargine (LANTUS SOLOSTAR) 100 UNIT/ML Solostar Pen [[phone]]     Preferred pharmacy (Name & Address): [id number] - [place], [place] [phone] (Phone) [phone] (Fax)       Other comments (if applicable):

## 2017-05-22 NOTE — Telephone Encounter (Signed)
Submitted

## 2017-06-09 DIAGNOSIS — E669 Obesity, unspecified: Secondary | ICD-10-CM | POA: Diagnosis not present

## 2017-06-09 DIAGNOSIS — F17200 Nicotine dependence, unspecified, uncomplicated: Secondary | ICD-10-CM | POA: Diagnosis not present

## 2017-06-09 DIAGNOSIS — G960 Cerebrospinal fluid leak: Secondary | ICD-10-CM | POA: Diagnosis not present

## 2017-06-09 DIAGNOSIS — Z6841 Body Mass Index (BMI) 40.0 and over, adult: Secondary | ICD-10-CM | POA: Diagnosis not present

## 2017-06-09 DIAGNOSIS — J449 Chronic obstructive pulmonary disease, unspecified: Secondary | ICD-10-CM | POA: Diagnosis not present

## 2017-06-18 ENCOUNTER — Emergency Department (HOSPITAL_COMMUNITY)
Admission: EM | Admit: 2017-06-18 | Discharge: 2017-06-18 | Disposition: A | Discharge status: Home / Self Care | Payer: Medicare Other | Attending: Emergency Medicine | Admitting: Emergency Medicine

## 2017-06-18 ENCOUNTER — Encounter (HOSPITAL_COMMUNITY): Payer: Self-pay | Admitting: *Deleted

## 2017-06-18 ENCOUNTER — Emergency Department (HOSPITAL_COMMUNITY): Payer: Medicare Other

## 2017-06-18 DIAGNOSIS — J449 Chronic obstructive pulmonary disease, unspecified: Secondary | ICD-10-CM | POA: Diagnosis not present

## 2017-06-18 DIAGNOSIS — I10 Essential (primary) hypertension: Secondary | ICD-10-CM | POA: Diagnosis not present

## 2017-06-18 DIAGNOSIS — I252 Old myocardial infarction: Secondary | ICD-10-CM | POA: Insufficient documentation

## 2017-06-18 DIAGNOSIS — E0859 Diabetes mellitus due to underlying condition with other circulatory complications: Secondary | ICD-10-CM | POA: Diagnosis not present

## 2017-06-18 DIAGNOSIS — Z8673 Personal history of transient ischemic attack (TIA), and cerebral infarction without residual deficits: Secondary | ICD-10-CM | POA: Insufficient documentation

## 2017-06-18 DIAGNOSIS — R000 Tachycardia, unspecified: Secondary | ICD-10-CM | POA: Diagnosis not present

## 2017-06-18 DIAGNOSIS — R079 Chest pain, unspecified: Secondary | ICD-10-CM | POA: Diagnosis not present

## 2017-06-18 DIAGNOSIS — R002 Palpitations: Secondary | ICD-10-CM | POA: Diagnosis present

## 2017-06-18 DIAGNOSIS — F17210 Nicotine dependence, cigarettes, uncomplicated: Secondary | ICD-10-CM | POA: Insufficient documentation

## 2017-06-18 DIAGNOSIS — I471 Supraventricular tachycardia: Secondary | ICD-10-CM | POA: Insufficient documentation

## 2017-06-18 DIAGNOSIS — J45909 Unspecified asthma, uncomplicated: Secondary | ICD-10-CM | POA: Diagnosis not present

## 2017-06-18 LAB — BASIC METABOLIC PANEL
Anion gap: 10 (ref 5–15)
BUN: 13 mg/dL (ref 6–20)
CO2: 29 mmol/L (ref 22–32)
Calcium: 8.5 mg/dL — ABNORMAL LOW (ref 8.9–10.3)
Chloride: 98 mmol/L — ABNORMAL LOW (ref 101–111)
Creatinine, Ser: 1.4 mg/dL — ABNORMAL HIGH (ref 0.44–1.00)
GFR calc Af Amer: 46 mL/min — ABNORMAL LOW (ref >60)
GFR calc non Af Amer: 40 mL/min — ABNORMAL LOW (ref >60)
Glucose, Bld: 314 mg/dL — ABNORMAL HIGH (ref 65–99)
Potassium: 3.4 mmol/L — ABNORMAL LOW (ref 3.5–5.1)
Sodium: 137 mmol/L (ref 135–145)

## 2017-06-18 LAB — CBC
HCT: 47.6 % — ABNORMAL HIGH (ref 36.0–46.0)
Hemoglobin: 15.1 g/dL — ABNORMAL HIGH (ref 12.0–15.0)
MCH: 29.5 pg (ref 26.0–34.0)
MCHC: 31.7 g/dL (ref 30.0–36.0)
MCV: 93 fL (ref 78.0–100.0)
Platelets: 167 10*3/uL (ref 150–400)
RBC: 5.12 MIL/uL — ABNORMAL HIGH (ref 3.87–5.11)
RDW: 14.5 % (ref 11.5–15.5)
WBC: 7.8 10*3/uL (ref 4.0–10.5)

## 2017-06-18 LAB — TROPONIN I
Troponin I: 0.06 ng/mL (ref <0.03)
Troponin I: 0.11 ng/mL (ref <0.03)

## 2017-06-18 LAB — TSH: TSH: 2.8 u[IU]/mL (ref 0.350–4.500)

## 2017-06-18 LAB — I-STAT TROPONIN, ED: Troponin i, poc: 0.02 ng/mL (ref 0.00–0.08)

## 2017-06-18 MED ORDER — METOPROLOL TARTRATE 25 MG PO TABS: 25.0000 mg | ORAL_TABLET | Freq: Two times a day (BID) | ORAL | 0 refills | Status: DC

## 2017-06-18 MED ORDER — SODIUM CHLORIDE 0.9 % IV BOLUS (SEPSIS)
500.0000 mL | Freq: Once | INTRAVENOUS | Status: AC
  Administered 2017-06-18: 500 mL via INTRAVENOUS

## 2017-06-18 NOTE — ED Triage Notes (Signed)
Pt arrives from home via [facility]. Pt had c/o centralized CP, nausea, diaphoresis, sob and ha. Upon EMS arrival pt HR was 190. Pt received 6mg  of adenosine PTA and now has a HR of 96. Pt has no complaints at this time.

## 2017-06-18 NOTE — ED Provider Notes (Signed)
Emergency Department Provider Note   I have reviewed the triage vital signs and the nursing notes.   HISTORY  Chief Complaint Tachycardia   HPI [name] is a 61 y.o. female with PMH of COPD, CSF leakage s/p repair, DM, OSA, and prior CVA presents to the emergency department for evaluation of sudden onset heart palpitations, chest pain, headache, neck discomfort, and general feeling of panic. Symptoms began without obvious provocation while the patient was completing her morning activities. She called a family member who advised taking her to the local fire department. Because she felt so terrible, the patient elected to call 911 instead. EMS reported that on arrival the patient was in SVT. They established IV access and gave adenosine prior to ED presentation which converted the patient to a normal sinus rhythm and patient had dramatic relief in symptoms. She denies any residual chest pain, palpitations, dyspnea, headache, neck discomfort. The patient does note a history of CSF leak and is cautious regarding any headache or neck pain but reports that that discomfort started with the palpitations and ended with the medication and menstruation. No fever or chills. No radiation of symptoms. Denies drug or EtOH use. No coffee drinking.    Past Medical History:  Diagnosis Date  . Anxiety    doesn't take any meds for this  . Arthritis    knuckles  . Asthma   . Claustrophobia 07/26/2012  . Complication of anesthesia 01/2012   reports she extubated herself  . COPD (chronic obstructive pulmonary disease) ([place])   . Diarrhea   . Diverticulosis   . Dizziness    related to CSF leakage  . Headache(784.0)    related to CSF leakage  . History of chronic bronchitis 07/26/2012   "just about q winter"  . HTN (hypertension)    takes Amlodipine daily  . Hx of gout   . Hyperlipidemia    takes Pravastatin daily  . Internal hemorrhoids   . Kidney stones   . OSA (obstructive sleep apnea)  07/26/2012   denies CPAP  . Shortness of breath    "lying down; w/exertion; during heat"  . Sleep apnea    does not wear CPAP  . Stroke [facility]) 03/2004   denies residual 07/26/2012  . Type II diabetes mellitus ([place])   . Urinary frequency   . Urinary urgency     Patient Active Problem List   Diagnosis Date Noted  . OSA (obstructive sleep apnea) 10/17/2014  . Dyspnea 03/16/2014  . Influenza-like illness 12/14/2013  . Right knee pain 10/22/2013  . Pain of finger of right hand 10/22/2013  . Breast complaint 10/22/2013  . Pain in joint, multiple sites 09/21/2013  . Carotid artery stenosis 06/03/2013  . Dizziness 02/16/2013  . Headache(784.0) 01/17/2013  . Depression 12/12/2012  . Tobacco abuse counseling 11/08/2012  . Weakness of left side of body 07/26/2012  . Uric acid kidney stone 07/04/2012  . CSF leak from nose 10/19/2011  . Low back pain radiating to both legs 10/19/2011  . Poorly controlled type 2 diabetes mellitus with circulatory disorder ([place]) 08/31/2011  . HLD (hyperlipidemia) 07/02/2011  . Hypertension 06/20/2011  . Obesity 06/20/2011    Past Surgical History:  Procedure Laterality Date  . ESOPHAGOGASTRODUODENOSCOPY (EGD) WITH PROPOFOL N/A 07/13/2015   Procedure: ESOPHAGOGASTRODUODENOSCOPY (EGD) WITH PROPOFOL;  Surgeon: Carol Ada, MD;  Location: [facility];  Service: Endoscopy;  Laterality: N/A;  . NASAL SINUS SURGERY  01/29/2012   Procedure: ENDOSCOPIC SINUS SURGERY WITH STEALTH;  Surgeon: Ruby Cola, MD;  [facility];  Service: ENT;  Laterality: N/A;  . NM MYOVIEW LTD  08/25/2011   Low risk study, no evidence of ischemia, EF 44%  . REFRACTIVE SURGERY  ~ 2010   left  . SPHENOIDECTOMY  01/29/2012   Procedure: SPHENOIDECTOMY;  Surgeon: Ruby Cola, MD;  Location: [place];  Service: ENT;  Laterality: Left;  REPAIR OF LEFT SPHENOID    . TOTAL ABDOMINAL HYSTERECTOMY  1994    Current Outpatient Rx  . Order #: [id number] Class: Historical Med  . Order #:  [id number] Class: Historical Med  . Order #: [id number] Class: Historical Med  . Order #: [id number] Class: Historical Med  . Order #: [id number] Class: Historical Med  . Order #: [id number] Class: Historical Med  . Order #: [id number] Class: Historical Med  . Order #: [id number] Class: Normal  . Order #: [id number] Class: Normal  . Order #: [id number] Class: Normal  . Order #: [id number] Class: No Print  . Order #: [id number] Class: Normal  . Order #: [id number] Class: Historical Med  . Order #: [id number] Class: Historical Med  . Order #: [id number] Class: Historical Med  . Order #: [id number] Class: Normal  . Order #: [id number] Class: Normal  . Order #: [id number] Class: Print  . Order #: [id number] Class: Print  . Order #: [id number] Class: Print    Allergies Atorvastatin; Ibuprofen; Metformin and related; Ace inhibitors; and Hydrocodone-acetaminophen  Family History  Problem Relation Age of Onset  . Colon cancer Sister 66  . Breast cancer Mother 60  . Diabetes Sister 45  . Diabetes Sister 53  . Lung cancer Father   . Breast cancer Sister   . Cancer Sister        mouth  . Anesthesia problems Neg Hx   . Hypotension Neg Hx   . Malignant hyperthermia Neg Hx   . Pseudochol deficiency Neg Hx     Social History Social History  Substance Use Topics  . Smoking status: Current Every Day Smoker    Packs/day: 0.50    Years: 35.00    Types: Cigarettes  . Smokeless tobacco: Never Used     Comment: 07/26/2012 has decreased smoking from 2ppd; offered cessation materials; pt declines  . Alcohol use 0.6 oz/week    1 Glasses of wine per week     Comment: 3 beers monthly    Review of Systems  Constitutional: No fever/chills Eyes: No visual changes. ENT: No sore throat. Cardiovascular: Positive chest pain and palpitations.  Respiratory: Denies shortness of breath. Gastrointestinal: No abdominal pain. Positive nausea, no vomiting.  No diarrhea.  No constipation. Genitourinary: Negative for  dysuria. Musculoskeletal: Negative for back pain. Skin: Negative for rash. Neurological: Negative for focal weakness or numbness. Positive HA and neck pain.   10-point ROS otherwise negative.  ____________________________________________   PHYSICAL EXAM:  VITAL SIGNS: ED Triage Vitals  Enc Vitals Group     BP 06/18/17 1217 (!) 148/74     Pulse Rate 06/18/17 1217 95     Resp 06/18/17 1217 13     Temp 06/18/17 1212 98.8 F (37.1 C)     Temp Source 06/18/17 1212 Oral     SpO2 06/18/17 1208 92 %     Pain Score 06/18/17 1210 0   Constitutional: Alert and oriented. Well appearing and in no acute distress. Eyes: Conjunctivae are normal.  Head: Atraumatic. Nose: No congestion/rhinnorhea. Mouth/Throat: Mucous membranes are moist.  Oropharynx non-erythematous. Neck: No stridor.   Cardiovascular: Normal rate, regular rhythm. Good peripheral circulation. Grossly normal heart sounds.   Respiratory: Normal  respiratory effort.  No retractions. Lungs CTAB. Gastrointestinal: Soft and nontender. No distention.  Musculoskeletal: No lower extremity tenderness nor edema. No gross deformities of extremities. Neurologic:  Normal speech and language. No gross focal neurologic deficits are appreciated.  Skin:  Skin is warm, dry and intact. No rash noted.  ____________________________________________   LABS (all labs ordered are listed, but only abnormal results are displayed)  Labs Reviewed  BASIC METABOLIC PANEL - Abnormal; Notable for the following:       Result Value   Potassium 3.4 (*)    Chloride 98 (*)    Glucose, Bld 314 (*)    Creatinine, Ser 1.40 (*)    Calcium 8.5 (*)    GFR calc non Af Amer 40 (*)    GFR calc Af Amer 46 (*)    All other components within normal limits  CBC - Abnormal; Notable for the following:    RBC 5.12 (*)    Hemoglobin 15.1 (*)    HCT 47.6 (*)    All other components within normal limits  TROPONIN I - Abnormal; Notable for the following:    Troponin  I 0.06 (*)    All other components within normal limits  TROPONIN I - Abnormal; Notable for the following:    Troponin I 0.11 (*)    All other components within normal limits  TSH  I-STAT TROPOININ, ED   ____________________________________________  EKG   EKG Interpretation  Date/Time:  Thursday June 18 2017 12:11:23 EDT Ventricular Rate:  98 PR Interval:    QRS Duration: 87 QT Interval:  384 QTC Calculation: 491 R Axis:   -170 Text Interpretation:  Sinus rhythm Right axis deviation Abnormal R-wave progression, late transition Borderline prolonged QT interval No STEMI.  Confirmed by Nanda Quinton [phone]) on 06/18/2017 1:46:11 PM       ____________________________________________  RADIOLOGY  Dg Chest 2 View  Result Date: 06/18/2017 CLINICAL DATA:  Supraventricular tachycardia. EXAM: CHEST  2 VIEW COMPARISON:  03/15/2014 FINDINGS: Mild cardiomegaly when accounting for low volumes; stable aortic tortuosity. There is chronic interstitial coarsening without Kerley lines or pleural effusion -likely from patient's smoking history. Mild hyperinflation. No air bronchograms. Spondylosis with multi-level ankylosis. IMPRESSION: 1. No acute finding when compared to prior. 2. Mild cardiomegaly. Electronically Signed   By: Monte Fantasia M.D.   On: 06/18/2017 13:00    ____________________________________________   PROCEDURES  Procedure(s) performed:   Procedures  None ____________________________________________   INITIAL IMPRESSION / ASSESSMENT AND PLAN / ED COURSE  Pertinent labs & imaging results that were available during my care of the patient were reviewed by me and considered in my medical decision making (see chart for details).  Patient arrived to the emergency department by EMS with SVT. She was converted with adenosine in route and has had complete resolution of symptoms upon ED presentation. No residual chest pain, headache, neck discomfort. No obvious provoking  factor, medications, or activity. Plan to obtain lab work and observe in the emergency department for return of SVT.   Patient with mild troponin elevation. Plan for discussion with Cardiology regarding case.   Spoke with Dr. Cathie Olden regarding the case. Advises trending troponin if desired but clinically the patient is completely better and can f/u in office with Cardiology for further testing. Would even expect a slightly increased troponin given approximately 30 minutes of SVT and HR in the 90s from demand ischemia. No active CP now. Also advises starting low dose Metoprolol prior to d/c.   04:30  PM Repeat troponin slightly elevated from initial, now 0.11. No CP, SOB, or palpitations. Suspect demand ischemia from SVT earlier per conversation with Dr. Cathie Olden. No evidence to suggest underlying NSTEMI. EKG in the ED is normal with no evidence of acute ischemia. Plan for beta blocker and discharge home.   At this time, I do not feel there is any life-threatening condition present. I have reviewed and discussed all results (EKG, imaging, lab, urine as appropriate), exam findings with patient. I have reviewed nursing notes and appropriate previous records.  I feel the patient is safe to be discharged home without further emergent workup. Discussed usual and customary return precautions. Patient and family (if present) verbalize understanding and are comfortable with this plan.  Patient will follow-up with their primary care provider. If they do not have a primary care provider, information for follow-up has been provided to them. All questions have been answered.  ____________________________________________  FINAL CLINICAL IMPRESSION(S) / ED DIAGNOSES  Final diagnoses:  SVT (supraventricular tachycardia) ([facility])     MEDICATIONS GIVEN DURING THIS VISIT:  Medications  sodium chloride 0.9 % bolus 500 mL (500 mLs Intravenous New Bag/Given 06/18/17 1352)     NEW OUTPATIENT MEDICATIONS STARTED DURING  THIS VISIT:  New Prescriptions   METOPROLOL TARTRATE (LOPRESSOR) 25 MG TABLET    Take 1 tablet (25 mg total) by mouth 2 (two) times daily.      Note:  This document was prepared using Dragon voice recognition software and may include unintentional dictation errors.  Nanda Quinton, MD Emergency Medicine    Layman Gully, [facility], MD 06/18/17 1640

## 2017-06-18 NOTE — Discharge Instructions (Signed)
You were seen in the ED after conversion out of a serious heart rhythm. The paramedics were able to restore a normal rhythm and your symptoms resolved. Your heart labs were slightly abnormal after this event which can be normal. You will need to start the Metoprolol as directed and call to schedule a follow up appointment with the Cardiology team.   Return to the ED immediately with any chest pain, difficulty breathing, racing heartbeat, or other concerning symptoms to you.

## 2017-06-18 NOTE — ED Notes (Signed)
Dr. Laverta Baltimore notified of critical Troponin (0.06)

## 2017-06-22 ENCOUNTER — Encounter (HOSPITAL_COMMUNITY): Payer: Self-pay

## 2017-06-22 ENCOUNTER — Emergency Department (HOSPITAL_COMMUNITY)
Admission: EM | Admit: 2017-06-22 | Discharge: 2017-06-22 | Disposition: A | Discharge status: Home / Self Care | Payer: Medicare Other | Attending: Emergency Medicine | Admitting: Emergency Medicine

## 2017-06-22 ENCOUNTER — Other Ambulatory Visit: Payer: Self-pay | Admitting: Cardiology

## 2017-06-22 DIAGNOSIS — F17210 Nicotine dependence, cigarettes, uncomplicated: Secondary | ICD-10-CM | POA: Insufficient documentation

## 2017-06-22 DIAGNOSIS — J449 Chronic obstructive pulmonary disease, unspecified: Secondary | ICD-10-CM | POA: Diagnosis not present

## 2017-06-22 DIAGNOSIS — I471 Supraventricular tachycardia: Secondary | ICD-10-CM

## 2017-06-22 DIAGNOSIS — Z79899 Other long term (current) drug therapy: Secondary | ICD-10-CM | POA: Insufficient documentation

## 2017-06-22 DIAGNOSIS — J45909 Unspecified asthma, uncomplicated: Secondary | ICD-10-CM | POA: Insufficient documentation

## 2017-06-22 DIAGNOSIS — I119 Hypertensive heart disease without heart failure: Secondary | ICD-10-CM

## 2017-06-22 DIAGNOSIS — R002 Palpitations: Secondary | ICD-10-CM | POA: Diagnosis present

## 2017-06-22 DIAGNOSIS — Z8673 Personal history of transient ischemic attack (TIA), and cerebral infarction without residual deficits: Secondary | ICD-10-CM | POA: Insufficient documentation

## 2017-06-22 DIAGNOSIS — Z955 Presence of coronary angioplasty implant and graft: Secondary | ICD-10-CM | POA: Insufficient documentation

## 2017-06-22 DIAGNOSIS — Z7982 Long term (current) use of aspirin: Secondary | ICD-10-CM | POA: Insufficient documentation

## 2017-06-22 DIAGNOSIS — I10 Essential (primary) hypertension: Secondary | ICD-10-CM | POA: Insufficient documentation

## 2017-06-22 DIAGNOSIS — Z794 Long term (current) use of insulin: Secondary | ICD-10-CM | POA: Insufficient documentation

## 2017-06-22 DIAGNOSIS — E119 Type 2 diabetes mellitus without complications: Secondary | ICD-10-CM | POA: Diagnosis not present

## 2017-06-22 DIAGNOSIS — I517 Cardiomegaly: Secondary | ICD-10-CM

## 2017-06-22 DIAGNOSIS — R079 Chest pain, unspecified: Secondary | ICD-10-CM | POA: Diagnosis not present

## 2017-06-22 LAB — BASIC METABOLIC PANEL
Anion gap: 11 (ref 5–15)
BUN: 10 mg/dL (ref 6–20)
CO2: 29 mmol/L (ref 22–32)
Calcium: 8.5 mg/dL — ABNORMAL LOW (ref 8.9–10.3)
Chloride: 101 mmol/L (ref 101–111)
Creatinine, Ser: 0.98 mg/dL (ref 0.44–1.00)
GFR calc Af Amer: >60 mL/min (ref >60)
GFR calc non Af Amer: >60 mL/min (ref >60)
Glucose, Bld: 98 mg/dL (ref 65–99)
Potassium: 3.3 mmol/L — ABNORMAL LOW (ref 3.5–5.1)
Sodium: 141 mmol/L (ref 135–145)

## 2017-06-22 LAB — CBC WITH DIFFERENTIAL/PLATELET
Basophils Absolute: 0 10*3/uL (ref 0.0–0.1)
Basophils Relative: 0 %
Eosinophils Absolute: 0.3 10*3/uL (ref 0.0–0.7)
Eosinophils Relative: 3 %
HCT: 47.5 % — ABNORMAL HIGH (ref 36.0–46.0)
Hemoglobin: 15.7 g/dL — ABNORMAL HIGH (ref 12.0–15.0)
Lymphocytes Relative: 18 %
Lymphs Abs: 1.9 10*3/uL (ref 0.7–4.0)
MCH: 31 pg (ref 26.0–34.0)
MCHC: 33.1 g/dL (ref 30.0–36.0)
MCV: 93.7 fL (ref 78.0–100.0)
Monocytes Absolute: 0.4 10*3/uL (ref 0.1–1.0)
Monocytes Relative: 4 %
Neutro Abs: 8.1 10*3/uL — ABNORMAL HIGH (ref 1.7–7.7)
Neutrophils Relative %: 75 %
Platelets: 176 10*3/uL (ref 150–400)
RBC: 5.07 MIL/uL (ref 3.87–5.11)
RDW: 14.8 % (ref 11.5–15.5)
WBC: 10.7 10*3/uL — ABNORMAL HIGH (ref 4.0–10.5)

## 2017-06-22 LAB — I-STAT TROPONIN, ED: Troponin i, poc: 0.04 ng/mL (ref 0.00–0.08)

## 2017-06-22 MED ORDER — VERAPAMIL HCL ER 180 MG PO TBCR: 180.0000 mg | EXTENDED_RELEASE_TABLET | Freq: Every day | ORAL | 6 refills | Status: DC

## 2017-06-22 MED ORDER — LEVALBUTEROL TARTRATE 45 MCG/ACT IN AERO: 1.0000 | INHALATION_SPRAY | RESPIRATORY_TRACT | 2 refills | Status: DC | PRN

## 2017-06-22 MED ORDER — VERAPAMIL HCL ER 240 MG PO TBCR: 240.0000 mg | EXTENDED_RELEASE_TABLET | Freq: Every day | ORAL | 6 refills | Status: DC

## 2017-06-22 MED ORDER — POTASSIUM CHLORIDE CRYS ER 20 MEQ PO TBCR
40.0000 meq | EXTENDED_RELEASE_TABLET | Freq: Once | ORAL | Status: AC
  Administered 2017-06-22: 40 meq via ORAL
  Filled 2017-06-22: qty 2

## 2017-06-22 MED ORDER — VERAPAMIL HCL ER 180 MG PO TBCR
180.0000 mg | EXTENDED_RELEASE_TABLET | Freq: Every day | ORAL | Status: DC
  Administered 2017-06-22: 180 mg via ORAL
  Filled 2017-06-22: qty 1

## 2017-06-22 MED ORDER — SODIUM CHLORIDE 0.9 % IV BOLUS (SEPSIS)
500.0000 mL | Freq: Once | INTRAVENOUS | Status: AC
  Administered 2017-06-22: 500 mL via INTRAVENOUS

## 2017-06-22 NOTE — H&P (Signed)
Cardiology Admission History and Physical:   Patient ID: [name]; [id number]; 05-17-1956   Admission date: 06/22/2017  Primary Care Provider: Luetta Nutting, DO Primary Cardiologist:remote Dr. Harrington Challenger 2012 New Primary Electrophysiologist:  na  Chief Complaint:  Rapid HR  Patient Profile:   [name] is a 61 y.o. female with a history of 2 CVAs from HTN per pt,  DM on insulin, COPD,CSF leakage with repair, sleep apnea without cpap presents for rapid HR the second time in a week.     History of Present Illness:   [name] of 2 CVAs from HTN per pt,  DM on insulin, COPD, sleep apnea without cpap presents for rapid HR the second time in a week.   Last Thursday 06/18/17 was at home and developed severe H/A and was aware rapid HR.  No chest pain but some SOB.  EMS called and adenosine was given 6 mg adenosine and HR 96 on arrival to [facility].  On ER note she did have chest pain that resolved with restoring SR.    Labs then Cr 1.40, K+ 3.4, glucose 314,  hgb was 15.1  Troponin 0.06; 0.11 and poc 0.02  TSH 2.80 Previous nuc study 2012 neg for ischemia.  Echo 2013 EF 60-65%, no RWMA, moderate LVH.  Carotid stenosis 2013 with 60-79% internal carotid stenosis but 2015 was 40-59% stenosis. .   Today similar episode with headache and rapid HR, EMS called and with bearing down she converted to SR  EKG SR poor R wave progression compared to EKG  2014. On 06/18/17 EKG today SR with poor R wave progression  No acute changes.  I personally reviewed all these and strip from EMS.  Strip from EMS with tachycardia HR 180 Troponin poc 0.04  CBC WBC 10.7, Hgb 15.7  BMP K+ 3.3 Cr. 0.98  CXR on the 12th with mild cardiomegaly mild hyperinflation.   Pt has been using her albuterol inhaler more frequently.  She is hypokalemic today.   Currently resting comfortably without chest pain or SOB.   She has had more SOB at home.  Also lower ext edema.  On last ER visit she was placed on lopressor  but it made her nauseated.    So she has had trouble taking it.    Past Medical History:  Diagnosis Date  . Anxiety    doesn't take any meds for this  . Arthritis    knuckles  . Asthma   . Claustrophobia 07/26/2012  . Complication of anesthesia 01/2012   reports she extubated herself  . COPD (chronic obstructive pulmonary disease) ([place])   . Diarrhea   . Diverticulosis   . Dizziness    related to CSF leakage  . Headache(784.0)    related to CSF leakage  . History of chronic bronchitis 07/26/2012   "just about q winter"  . HTN (hypertension)    takes Amlodipine daily  . Hx of gout   . Hyperlipidemia    takes Pravastatin daily  . Internal hemorrhoids   . Kidney stones   . OSA (obstructive sleep apnea) 07/26/2012   denies CPAP  . Shortness of breath    "lying down; w/exertion; during heat"  . Sleep apnea    does not wear CPAP  . Stroke [facility]) 03/2004   denies residual 07/26/2012  . Type II diabetes mellitus ([place])   . Urinary frequency   . Urinary urgency     Past Surgical History:  Procedure Laterality Date  .  ESOPHAGOGASTRODUODENOSCOPY (EGD) WITH PROPOFOL N/A 07/13/2015   Procedure: ESOPHAGOGASTRODUODENOSCOPY (EGD) WITH PROPOFOL;  Surgeon: Carol Ada, MD;  [facility];  Service: Endoscopy;  Laterality: N/A;  . NASAL SINUS SURGERY  01/29/2012   Procedure: ENDOSCOPIC SINUS SURGERY WITH STEALTH;  Surgeon: Ruby Cola, MD;  Location: [place];  Service: ENT;  Laterality: N/A;  . NM MYOVIEW LTD  08/25/2011   Low risk study, no evidence of ischemia, EF 44%  . REFRACTIVE SURGERY  ~ 2010   left  . SPHENOIDECTOMY  01/29/2012   Procedure: SPHENOIDECTOMY;  Surgeon: Ruby Cola, MD;  Location: [place];  Service: ENT;  Laterality: Left;  REPAIR OF LEFT SPHENOID    . TOTAL ABDOMINAL HYSTERECTOMY  1994     Medications Prior to Admission: Prior to Admission medications   Medication Sig Start Date End Date Taking? Authorizing Provider  acetaminophen (TYLENOL) 325 MG tablet  Take 650 mg by mouth every 6 (six) hours as needed for mild pain.   Yes [provider]  albuterol (VENTOLIN HFA) 108 (90 Base) MCG/ACT inhaler Inhale 1 puff into the lungs 3 (three) times daily as needed for wheezing. 05/05/17  Yes [provider]  amLODipine (NORVASC) 10 MG tablet Take 10 mg by mouth daily.  03/31/15  Yes [provider]  aspirin 325 MG buffered tablet Take 325 mg by mouth daily.   Yes [provider]  baclofen (LIORESAL) 10 MG tablet Take 10 mg by mouth 2 (two) times daily.    Yes [provider]  busPIRone (BUSPAR) 15 MG tablet TAKE 1 TABLET BY MOUTH 3 TIMES A DAY 06/17/16  Yes [provider]  CRESTOR 20 MG tablet Take 20 mg by mouth daily.  02/18/15  Yes [provider]  FLUoxetine (PROZAC) 10 MG capsule Take 4 capsules (40 mg total) by mouth at bedtime. 11/24/13  Yes Losq, Burnell Blanks, MD  gabapentin (NEURONTIN) 400 MG capsule Take 1 capsule (400 mg total) by mouth 3 (three) times daily. 06/19/14  Yes Karamalegos, Devonne Doughty, DO  glycopyrrolate (ROBINUL) 2 MG tablet Take 1 tablet (2 mg total) by mouth 2 (two) times daily. 05/01/17  Yes Pyrtle, Lajuan Lines, MD  hydrochlorothiazide (HYDRODIURIL) 12.5 MG tablet Take 1 tablet (12.5 mg total) by mouth daily. 11/24/13  Yes Losq, Burnell Blanks, MD  insulin aspart (NOVOLOG FLEXPEN) 100 UNIT/ML FlexPen Inject 30-40 Units into the skin 3 (three) times daily with meals. 03/12/17  Yes Philemon Kingdom, MD  Insulin Glargine (LANTUS SOLOSTAR) 100 UNIT/ML Solostar Pen Inject 90 Units into the skin 2 (two) times daily. 05/22/17  Yes Philemon Kingdom, MD  methocarbamol (ROBAXIN) 750 MG tablet Take 750 mg by mouth every 6 (six) hours as needed for muscle spasms. 04/29/17  Yes [provider]  metoprolol tartrate (LOPRESSOR) 25 MG tablet Take 1 tablet (25 mg total) by mouth 2 (two) times daily. 06/18/17 07/18/17 Yes Long, Wonda Olds, MD  omeprazole (PRILOSEC) 40 MG capsule TAKE ONE CAPSULE BY  MOUTH EVERY DAY 06/20/15  Yes [provider]  topiramate (TOPAMAX) 25 MG tablet Take one tablet by mouth twice daily 11/25/12  Yes [provider]  valsartan (DIOVAN) 80 MG tablet Take 1 tablet (80 mg total) by mouth daily. 07/01/13  Yes Losq, Burnell Blanks, MD  ibuprofen (ADVIL,MOTRIN) 400 MG tablet Take 1 tablet (400 mg total) by mouth every 6 (six) hours as needed. Patient not taking: Reported on 03/12/2017 10/24/16   Bettey Costa, [place]  NONFORMULARY OR COMPOUNDED [place]:  Authorized Substitute cream - Iburpfen 15%, Baclofen 1%, Gabapentin 3%, Lidocaine 2%, apply 1-2 grams to affected area 3-4 times daily. Patient not taking: Reported on 03/12/2017 08/08/16   Gardiner Barefoot, DPM     Allergies:    Allergies  Allergen Reactions  . Atorvastatin Other (See Comments) and Nausea And Vomiting    "legs cramped; moderate to almost severe"  . Ibuprofen Other (See Comments)    Per pt, MD doesn't want pt to take  . Metformin And Related Nausea And Vomiting  . Ace Inhibitors Cough and Nausea And Vomiting  . Hydrocodone-Acetaminophen Nausea And Vomiting    Social History:   Social History   Social History  . Marital status: Single    Spouse name: N/A  . Number of children: 0  . Years of education: N/A   Occupational History  . disabled    Social History Main Topics  . Smoking status: Current Every Day Smoker    Packs/day: 0.50    Years: 35.00    Types: Cigarettes  . Smokeless tobacco: Never Used     Comment: 07/26/2012 has decreased smoking from 2ppd; offered cessation materials; pt declines  . Alcohol use 0.6 oz/week    1 Glasses of wine per week     Comment: 3 beers monthly  . Drug use: No  . Sexual activity: No   Other Topics Concern  . Not on file   Social History Narrative   Currently unemployed.  Really wants to find a job, but has been difficult for her.  No children, not married    Family History:  The patient's family history  includes Alzheimer's disease in her mother; Breast cancer in her sister; Breast cancer (age of onset: 58) in her mother; Cancer in her sister; Colon cancer (age of onset: 72) in her sister; Diabetes (age of onset: 9) in her sister; Diabetes (age of onset: 64) in her sister; Heart failure in her sister; Lung cancer in her father. There is no history of Anesthesia problems, Hypotension, Malignant hyperthermia, or Pseudochol deficiency.    ROS:  Please see the history of present illness.  General:no colds or fevers, no weight changes Skin:no rashes or ulcers HEENT:no blurred vision, no congestion CV:see HPI PUL:see HPI GI:no diarrhea constipation or melena, no indigestion GU:no hematuria, no dysuria MS:no joint pain, no claudication Neuro:no syncope, no lightheadedness Endo:+ diabetes poorly controlled, no thyroid disease    Physical Exam/Data:   Vitals:   06/22/17 1343 06/22/17 1348 06/22/17 1440  BP:  (!) 141/70 (!) 148/76  Pulse:  85 82  Resp:  20 20  Temp:  98.2 F (36.8 C)   TempSrc:  Oral   SpO2:  96% 93%  Weight: 248 lb (112.5 kg)    Height: 5\' 8"  (1.727 m)     No intake or output data in the 24 hours ending 06/22/17 1548 Filed Weights   06/22/17 1343  Weight: 248 lb (112.5 kg)   Body mass index is 37.71 kg/m.  General:  Well nourished, well developed, in no acute distress HEENT: normal Lymph: no adenopathy Neck: no JVD Endocrine:  No thryomegaly Vascular: No carotid bruits; 2+ bil pedal pulses Cardiac:  normal S1, S2; RRR; no murmur, gallup rub or click  Lungs:  clear to auscultation bilaterally, no wheezing, rhonchi or rales  Abd: soft, nontender, no hepatomegaly  Ext: 1+ edema at ankles Musculoskeletal:  No deformities, BUE and BLE strength normal and equal Skin: warm and dry  Neuro:  A&O X  3 MAE follows commands + facial symmetry. Psych:  Normal affect     Relevant CV Studies: Echo: 07/2012 Study Conclusions  Left ventricle: The cavity size was  normal. Wall thickness was increased in a pattern of moderate LVH. Systolic function was normal. The estimated ejection fraction was in the range of 60% to 65%. Wall motion was normal; there were no regional wall motion abnormalities. There was an increased relative contribution of atrial contraction to ventricular filling.       Laboratory Data:  Chemistry  Recent Labs Lab 06/18/17 1212 06/22/17 1427  NA 137 141  K 3.4* 3.3*  CL 98* 101  CO2 29 29  GLUCOSE 314* 98  BUN 13 10  CREATININE 1.40* 0.98  CALCIUM 8.5* 8.5*  GFRNONAA 40* >60  GFRAA 46* >60  ANIONGAP 10 11    No results for input(s): PROT, ALBUMIN, AST, ALT, ALKPHOS, BILITOT in the last 168 hours. Hematology  Recent Labs Lab 06/18/17 1212 06/22/17 1427  WBC 7.8 10.7*  RBC 5.12* 5.07  HGB 15.1* 15.7*  HCT 47.6* 47.5*  MCV 93.0 93.7  MCH 29.5 31.0  MCHC 31.7 33.1  RDW 14.5 14.8  PLT 167 176   Cardiac Enzymes  Recent Labs Lab 06/18/17 1308 06/18/17 1534  TROPONINI 0.06* 0.11*     Recent Labs Lab 06/18/17 1230 06/22/17 1445  TROPIPOC 0.02 0.04    BNPNo results for input(s): BNP, PROBNP in the last 168 hours.  DDimer No results for input(s): DDIMER in the last 168 hours.  Radiology/Studies:  No results found.  Assessment and Plan:   1. SVT 2 episodes in a week.  Stop metoprolol and add verapamil follow up for echo and possible EP visit.    2.   COPD has been using albuterol.  Change to xopenex.   3. DM-2 on insulin and not well controlled  4. OSA needs spilt night sleep study and resume cpap  5.  Moderate LVH on last echo.  Pt ok to be discharged.  Follow up in office.     Signed, Cecilie Kicks, NP  06/22/2017 3:48 PM   I have seen and examined the patient along with Cecilie Kicks, NP.  I have reviewed the chart, notes and new data.  I agree with NP's note.  Key new complaints: abrupt onset rapid palpitations, promptly terminated by Valsalva maneuver and by adenosine.  Episodes associated with increased albuterol use. Did not tolerate metoprolol. Key examination changes: morbid obesity limits the exam, but no clear CV abnormalities Key new findings / data: rhythm strip with narrow complex regular tachycardia, baseline artifact precludes identification of retrograde atrial activation. Baseline ECG does not show preexcitation. PRWP due to obesity.  PLAN: Sop amlodipine and use verapamil for both SVT and HTN. May need to titrate the dose up depending on BP and arrhythmia response. Try to limit bronchodilator use and prefer Xopenex to albuterol. Refer for EPS/RFA if this is ineffective.   Sanda Klein, MD, [place] [phone] 06/22/2017, 4:25 PM

## 2017-06-22 NOTE — ED Triage Notes (Addendum)
Pt presents to the ed with ems after having an episode of svt pta, pt was able vagle down and is now in a nsr, with no symptoms. This is patients second time this month with this incident, pt is on metoprolol. Pt hr was 190 with ems and is now 18

## 2017-06-22 NOTE — ED Notes (Signed)
Cardiology at bedside.

## 2017-06-22 NOTE — ED Provider Notes (Signed)
Emergency Department Provider Note   I have reviewed the triage vital signs and the nursing notes.   HISTORY  Chief Complaint SVT   HPI [name] is a 61 y.o. female with PMH of COPD, CSF leakage s/p repair, OSA, DM, and prior CVA presents to the emergency department for evaluation of sudden onset head discomfort and heart palpitations. The patient was accompanying her sister to the eye doctor and was waiting in the waiting room when symptoms began suddenly and without provocation. She was seen in the emergency department last week with a similar presentation. She denies any chest pain or difficulty breathing. Paramedics were called immediately and the patient had complete resolution of symptoms after vagal maneuvers. She did not require adenosine. Patient was discharged from the emergency department with plan to start taking metoprolol twice a day but states after 3 days it was giving her significant nausea and so she stopped 2 days ago. No other medication changes.  Past Medical History:  Diagnosis Date  . Anxiety    doesn't take any meds for this  . Arthritis    knuckles  . Asthma   . Claustrophobia 07/26/2012  . Complication of anesthesia 01/2012   reports she extubated herself  . COPD (chronic obstructive pulmonary disease) ([place])   . Diarrhea   . Diverticulosis   . Dizziness    related to CSF leakage  . Headache(784.0)    related to CSF leakage  . History of chronic bronchitis 07/26/2012   "just about q winter"  . HTN (hypertension)    takes Amlodipine daily  . Hx of gout   . Hyperlipidemia    takes Pravastatin daily  . Internal hemorrhoids   . Kidney stones   . OSA (obstructive sleep apnea) 07/26/2012   denies CPAP  . Shortness of breath    "lying down; w/exertion; during heat"  . Sleep apnea    does not wear CPAP  . Stroke [facility]) 03/2004   denies residual 07/26/2012  . Type II diabetes mellitus ([place])   . Urinary frequency   . Urinary urgency      Patient Active Problem List   Diagnosis Date Noted  . OSA (obstructive sleep apnea) 10/17/2014  . Dyspnea 03/16/2014  . Influenza-like illness 12/14/2013  . Right knee pain 10/22/2013  . Pain of finger of right hand 10/22/2013  . Breast complaint 10/22/2013  . Pain in joint, multiple sites 09/21/2013  . Carotid artery stenosis 06/03/2013  . Dizziness 02/16/2013  . Headache(784.0) 01/17/2013  . Depression 12/12/2012  . Tobacco abuse counseling 11/08/2012  . Weakness of left side of body 07/26/2012  . Uric acid kidney stone 07/04/2012  . CSF leak from nose 10/19/2011  . Low back pain radiating to both legs 10/19/2011  . Poorly controlled type 2 diabetes mellitus with circulatory disorder ([place]) 08/31/2011  . HLD (hyperlipidemia) 07/02/2011  . Hypertension 06/20/2011  . Obesity 06/20/2011    Past Surgical History:  Procedure Laterality Date  . ESOPHAGOGASTRODUODENOSCOPY (EGD) WITH PROPOFOL N/A 07/13/2015   Procedure: ESOPHAGOGASTRODUODENOSCOPY (EGD) WITH PROPOFOL;  Surgeon: Carol Ada, MD;  [facility];  Service: Endoscopy;  Laterality: N/A;  . NASAL SINUS SURGERY  01/29/2012   Procedure: ENDOSCOPIC SINUS SURGERY WITH STEALTH;  Surgeon: Ruby Cola, MD;  Location: [place];  Service: ENT;  Laterality: N/A;  . NM MYOVIEW LTD  08/25/2011   Low risk study, no evidence of ischemia, EF 44%  . REFRACTIVE SURGERY  ~ 2010   left  .  SPHENOIDECTOMY  01/29/2012   Procedure: SPHENOIDECTOMY;  Surgeon: Ruby Cola, MD;  Location: [place];  Service: ENT;  Laterality: Left;  REPAIR OF LEFT SPHENOID    . TOTAL ABDOMINAL HYSTERECTOMY  1994    Current Outpatient Rx  . Order #: [id number] Class: Historical Med  . Order #: [id number] Class: Historical Med  . Order #: [id number] Class: Historical Med  . Order #: [id number] Class: Historical Med  . Order #: [id number] Class: Historical Med  . Order #: [id number] Class: Historical Med  . Order #: [id number] Class: Historical Med  . Order #:  [id number] Class: Normal  . Order #: [id number] Class: Normal  . Order #: [id number] Class: Normal  . Order #: [id number] Class: Normal  . Order #: [id number] Class: No Print  . Order #: [id number] Class: Normal  . Order #: [id number] Class: Historical Med  . Order #: [id number] Class: Print  . Order #: [id number] Class: Historical Med  . Order #: [id number] Class: Historical Med  . Order #: [id number] Class: Normal  . Order #: [id number] Class: Print  . Order #: [id number] Class: Print  . Order #: [id number] Class: Print  . Order #: [id number] Class: Print    Allergies Atorvastatin; Ibuprofen; Metformin and related; Ace inhibitors; and Hydrocodone-acetaminophen  Family History  Problem Relation Age of Onset  . Colon cancer Sister 43  . Breast cancer Mother 48  . Alzheimer's disease Mother   . Diabetes Sister 57  . Heart failure Sister   . Diabetes Sister 17  . Lung cancer Father   . Breast cancer Sister   . Cancer Sister        mouth  . Anesthesia problems Neg Hx   . Hypotension Neg Hx   . Malignant hyperthermia Neg Hx   . Pseudochol deficiency Neg Hx     Social History Social History  Substance Use Topics  . Smoking status: Current Every Day Smoker    Packs/day: 0.50    Years: 35.00    Types: Cigarettes  . Smokeless tobacco: Never Used     Comment: 07/26/2012 has decreased smoking from 2ppd; offered cessation materials; pt declines  . Alcohol use 0.6 oz/week    1 Glasses of wine per week     Comment: 3 beers monthly    Review of Systems  Constitutional: No fever/chills Eyes: No visual changes. ENT: No sore throat. Cardiovascular: Denies chest pain. Positive SVT recurrence.  Respiratory: Denies shortness of breath. Gastrointestinal: No abdominal pain.  No nausea, no vomiting.  No diarrhea.  No constipation. Genitourinary: Negative for dysuria. Musculoskeletal: Negative for back pain. Skin: Negative for rash. Neurological: Negative for focal weakness or numbness. Positive HA.   10-point  ROS otherwise negative.  ____________________________________________   PHYSICAL EXAM:  VITAL SIGNS: ED Triage Vitals  Enc Vitals Group     BP 06/22/17 1348 (!) 141/70     Pulse Rate 06/22/17 1348 85     Resp 06/22/17 1348 20     Temp 06/22/17 1348 98.2 F (36.8 C)     Temp Source 06/22/17 1348 Oral     SpO2 06/22/17 1348 96 %     Weight 06/22/17 1343 248 lb (112.5 kg)     Height 06/22/17 1343 5\' 8"  (1.727 m)   Constitutional: Alert and oriented. Well appearing and in no acute distress. Eyes: Conjunctivae are normal. Head: Atraumatic. Nose: No congestion/rhinnorhea. Mouth/Throat: Mucous membranes are moist.  Oropharynx non-erythematous. Neck: No stridor.   Cardiovascular: Normal rate, regular rhythm. Good peripheral circulation. Grossly normal heart sounds.   Respiratory: Normal respiratory effort.  No retractions. Lungs CTAB. Gastrointestinal: Soft  and nontender. No distention.  Musculoskeletal: No lower extremity tenderness nor edema. No gross deformities of extremities. Neurologic:  Normal speech and language. No gross focal neurologic deficits are appreciated.  Skin:  Skin is warm, dry and intact. No rash noted.  ____________________________________________   LABS (all labs ordered are listed, but only abnormal results are displayed)  Labs Reviewed  BASIC METABOLIC PANEL - Abnormal; Notable for the following:       Result Value   Potassium 3.3 (*)    Calcium 8.5 (*)    All other components within normal limits  CBC WITH DIFFERENTIAL/PLATELET - Abnormal; Notable for the following:    WBC 10.7 (*)    Hemoglobin 15.7 (*)    HCT 47.5 (*)    Neutro Abs 8.1 (*)    All other components within normal limits  RAPID URINE DRUG SCREEN, [place], ED   ____________________________________________  EKG   EKG Interpretation  Date/Time:  Monday June 22 2017 13:52:51 EDT Ventricular Rate:  86 PR Interval:  158 QRS Duration: 82 QT  Interval:  244 QTC Calculation: 291 R Axis:   -107 Text Interpretation:  Normal sinus rhythm Indeterminate axis Possible Anterior infarct , age undetermined Abnormal ECG No STEMI.  Confirmed by Nanda Quinton [id number]) on 06/22/2017 2:25:00 PM       ____________________________________________  RADIOLOGY  None ____________________________________________   PROCEDURES  Procedure(s) performed:   Procedures  None ____________________________________________   INITIAL IMPRESSION / ASSESSMENT AND PLAN / ED COURSE  Pertinent labs & imaging results that were available during my care of the patient were reviewed by me and considered in my medical decision making (see chart for details).  Patient presents to the emergency department for evaluation of SVT recurrence. She was discharged last week with metoprolol which she stopped taking 2 days ago because of nausea. SVT was converted with vagal maneuvers by EMS. Labs pending. Consultation cardiology for additional medication recommendations and to assist with electrophysiology workup.  04:20 PM Labs are largely unremarkable. Plan for discharge with Cardiology recommendations and f/u.   At this time, I do not feel there is any life-threatening condition present. I have reviewed and discussed all results (EKG, imaging, lab, urine as appropriate), exam findings with patient. I have reviewed nursing notes and appropriate previous records.  I feel the patient is safe to be discharged home without further emergent workup. Discussed usual and customary return precautions. Patient and family (if present) verbalize understanding and are comfortable with this plan.  Patient will follow-up with their primary care provider. If they do not have a primary care provider, information for follow-up has been provided to them. All questions have been answered.  ____________________________________________  FINAL CLINICAL IMPRESSION(S) / ED DIAGNOSES  Final  diagnoses:  SVT (supraventricular tachycardia) (HCC)     MEDICATIONS GIVEN DURING THIS VISIT:  Medications  verapamil (CALAN-SR) CR tablet 180 mg (not administered)  potassium chloride SA (K-DUR,KLOR-CON) CR tablet 40 mEq (not administered)  sodium chloride 0.9 % bolus 500 mL (0 mLs Intravenous Stopped 06/22/17 1545)     NEW OUTPATIENT MEDICATIONS STARTED DURING THIS VISIT:  Current Discharge Medication List    START taking these medications   Details  levalbuterol (XOPENEX HFA) 45 MCG/ACT inhaler Inhale 1 puff into the lungs every 4 (four) hours as needed for wheezing. Qty: 1 Inhaler, Refills: 2    verapamil (CALAN-SR) 240 MG CR tablet Take 1 tablet (240 mg total) by mouth daily. Qty: 30 tablet, Refills: 6  Note:  This document was prepared using Dragon voice recognition software and may include unintentional dictation errors.  Nanda Quinton, MD Emergency Medicine    Marquisa Salih, Wonda Olds, MD 06/22/17 [phone]

## 2017-06-22 NOTE — ED Notes (Signed)
Did ekg on patient shown to Dr Laverta [place] patient is resting

## 2017-06-22 NOTE — Discharge Instructions (Signed)
Stop metoprolol we changed to verapamil  Stop amlodipine as well.    We changed albuterol to xopenex but not sure if insurance will allow so ok to use albuterol if needed.     The office will call you for echo of your heart and follow up with Dr. Harrington Challenger or her PA or NP.

## 2017-06-24 ENCOUNTER — Telehealth: Payer: Self-pay | Admitting: Physician Assistant

## 2017-06-24 NOTE — Telephone Encounter (Signed)
Spoke with patient and she c/o of headache and racing heart rate since Thursday. Patient said her symptoms always start with a headache. Patient said the headache has improved at this time. Patient is unable to check HR and BP while she is symptomatic. Patient was told by EMS to grunt as hard as she can when she develops a fast heart rate to reduce her HR. Patient c/o active heart racing (rate unknown), chest pain rated 7.5/10, sob and dizziness. Patient said her first episode started Thursday last week, said she had none on the weekend and another episode on Monday this week where she had to pull over on side of the road and called 911. Patient said she went to the ED and was transported by EMS. Patient has been able to do ADL's but was very tearful while on the phone stating, "someone has got to help me with this problem". Patient c/o having another episode this morning at 5:00 am and again 1:50 pm today. Patient advised to go to the ED now for an evaluation. Patient verbalized understanding of plan.

## 2017-06-24 NOTE — Telephone Encounter (Signed)
New Message    Patient c/o Palpitations:  High priority if patient c/o lightheadedness and shortness of breath.  1. How long have you been having palpitations yes   2. Are you currently experiencing lightheadedness and shortness of breath? Both   3. Have you checked your BP and heart rate? (document readings)  Doesn't have access to equipment 4. Are you experiencing any other symptoms?   Sweating   Pt was taking to hospital last Thursday to hospital and again on Monday for rapid heart rate  ( heart rate has been 180-190 )   When her heart starts racing she gets weak, headache, dizzy , starts sweating

## 2017-07-03 ENCOUNTER — Other Ambulatory Visit: Payer: Self-pay

## 2017-07-03 ENCOUNTER — Ambulatory Visit (HOSPITAL_COMMUNITY): Payer: Medicare Other | Attending: Cardiology

## 2017-07-03 DIAGNOSIS — E119 Type 2 diabetes mellitus without complications: Secondary | ICD-10-CM | POA: Insufficient documentation

## 2017-07-03 DIAGNOSIS — I119 Hypertensive heart disease without heart failure: Secondary | ICD-10-CM | POA: Insufficient documentation

## 2017-07-03 DIAGNOSIS — E785 Hyperlipidemia, unspecified: Secondary | ICD-10-CM | POA: Diagnosis not present

## 2017-07-03 DIAGNOSIS — I517 Cardiomegaly: Secondary | ICD-10-CM

## 2017-07-03 DIAGNOSIS — Z8673 Personal history of transient ischemic attack (TIA), and cerebral infarction without residual deficits: Secondary | ICD-10-CM | POA: Diagnosis not present

## 2017-07-03 DIAGNOSIS — I471 Supraventricular tachycardia: Secondary | ICD-10-CM | POA: Diagnosis not present

## 2017-07-03 DIAGNOSIS — I340 Nonrheumatic mitral (valve) insufficiency: Secondary | ICD-10-CM | POA: Diagnosis not present

## 2017-07-03 DIAGNOSIS — J449 Chronic obstructive pulmonary disease, unspecified: Secondary | ICD-10-CM | POA: Diagnosis not present

## 2017-07-03 DIAGNOSIS — F17210 Nicotine dependence, cigarettes, uncomplicated: Secondary | ICD-10-CM | POA: Insufficient documentation

## 2017-07-03 DIAGNOSIS — G4733 Obstructive sleep apnea (adult) (pediatric): Secondary | ICD-10-CM | POA: Diagnosis not present

## 2017-07-03 MED ORDER — PERFLUTREN LIPID MICROSPHERE
1.0000 mL | INTRAVENOUS | Status: AC | PRN
  Administered 2017-07-03: 2 mL via INTRAVENOUS

## 2017-07-07 DIAGNOSIS — Z8669 Personal history of other diseases of the nervous system and sense organs: Secondary | ICD-10-CM | POA: Diagnosis not present

## 2017-07-07 DIAGNOSIS — F17210 Nicotine dependence, cigarettes, uncomplicated: Secondary | ICD-10-CM | POA: Diagnosis not present

## 2017-07-07 DIAGNOSIS — J3489 Other specified disorders of nose and nasal sinuses: Secondary | ICD-10-CM | POA: Diagnosis not present

## 2017-07-13 ENCOUNTER — Encounter: Payer: Self-pay | Admitting: Physician Assistant

## 2017-07-13 ENCOUNTER — Ambulatory Visit (INDEPENDENT_AMBULATORY_CARE_PROVIDER_SITE_OTHER): Payer: Medicare Other | Admitting: Physician Assistant

## 2017-07-13 VITALS — BP 170/48 | HR 85 | Ht 62.5 in | Wt 252.0 lb

## 2017-07-13 DIAGNOSIS — I739 Peripheral vascular disease, unspecified: Secondary | ICD-10-CM

## 2017-07-13 DIAGNOSIS — I5032 Chronic diastolic (congestive) heart failure: Secondary | ICD-10-CM | POA: Diagnosis not present

## 2017-07-13 DIAGNOSIS — I779 Disorder of arteries and arterioles, unspecified: Secondary | ICD-10-CM | POA: Diagnosis not present

## 2017-07-13 DIAGNOSIS — R072 Precordial pain: Secondary | ICD-10-CM | POA: Diagnosis not present

## 2017-07-13 DIAGNOSIS — I110 Hypertensive heart disease with heart failure: Secondary | ICD-10-CM | POA: Diagnosis not present

## 2017-07-13 DIAGNOSIS — J449 Chronic obstructive pulmonary disease, unspecified: Secondary | ICD-10-CM | POA: Diagnosis not present

## 2017-07-13 DIAGNOSIS — I471 Supraventricular tachycardia: Secondary | ICD-10-CM

## 2017-07-13 MED ORDER — ASPIRIN EC 81 MG PO TBEC: 81.0000 mg | DELAYED_RELEASE_TABLET | Freq: Every day | ORAL | 3 refills | Status: DC

## 2017-07-13 MED ORDER — VERAPAMIL HCL ER 300 MG PO CP24: 300.0000 mg | ORAL_CAPSULE | Freq: Every day | ORAL | 3 refills | Status: DC

## 2017-07-13 MED ORDER — FUROSEMIDE 40 MG PO TABS: 40.0000 mg | ORAL_TABLET | Freq: Every day | ORAL | 3 refills | Status: DC

## 2017-07-13 MED ORDER — POTASSIUM CHLORIDE CRYS ER 20 MEQ PO TBCR: 20.0000 meq | EXTENDED_RELEASE_TABLET | Freq: Every day | ORAL | 3 refills | Status: DC

## 2017-07-13 NOTE — Patient Instructions (Signed)
Medication Instructions:  1. STOP NORVASC 2. STOP HCTZ 3. STOP METOPROLOL  4. START LASIX 40 MG DAILY; RX HAS BEEN SENT IN 5. START POTASSIUM 20 MEQ DAILY ; RX HAS BEEN SENT IN  6. DECREASE ASPIRIN TO 81 MG DAILY 7. INCREASE VERAPAMIL TO 300 MG DAILY; NEW RX HAS BEEN SENT IN  Labwork: 1. TODAY BMET, PRO BNP  2. IN 1 WEEK YOU WILL NEED A REPEAT BMET  Testing/Procedures: 1. Your physician has requested that you have a lexiscan myoview. For further information please visit [email or web address]. Please follow instruction sheet, as given.    Follow-Up: [name], PAC 2 WEEKS SAME DAY DR. Harrington Challenger IS IN THE OFFICE   YOU ARE BEING REFERRED TO [facility]; THEIR OFFICE WILL CALL YOU WITH AN APPT.   Any Other Special Instructions Will Be Listed Below (If Applicable).     If you need a refill on your cardiac medications before your next appointment, please call your pharmacy.

## 2017-07-13 NOTE — Progress Notes (Signed)
Cardiology Office Note:    Date:  07/13/2017   ID:  [name], DOB 1956-03-09, MRN [id number]  PCP:  Rich Fuchs, PA  Cardiologist:  Seen remotely by Dr. Harrington Challenger in 2012    Referring MD: Luetta Nutting, DO   Chief Complaint  Patient presents with  . Hospitalization Follow-up    ED visit for SVT    History of Present Illness:    [name] is a 61 y.o. female with a hx of prior CVA x 2, HTN, IDDM, COPD, OSA, CSF rhinorrhea (2/2 encephalocele) s/p skull base reconstruction.  She was evaluated in the emergency room twice last month for PSVT. She was converted to NSR with adenosine 06/18/17. She did have elevated troponins consistent with demand ischemia. She was discharged on metoprolol. She stopped this secondary to nausea. On 7/16, she had recurrent symptoms and converted to NSR with vagal maneuvers. Of note, she was using her Albuterol inhaler quite often.  She was seen by Dr. Sallyanne Kuster in consultation.  She was placed on Verapamil.    [name] returns for post hospitalization follow up.  She is here with her sister.  The patient notes a couple episodes of palpitations since DC from the hospital.  But, these were short lived and she was able to stop it with vagal maneuvers.  The patient notes significant chest pain and shortness of breath with activity over the past 2-3 months. She feels heavy in her chest and notes associated dyspnea, diaphoresis and nausea. She denies any radiating symptoms. She sleeps on 4 pillows. She has experienced PND and has LE edema. She denies syncope. She continues to smoke. She notes significant wheeze as well as productive cough. She denies hemoptysis. She is up about 10 pounds over the last couple of months.  Prior CV studies:   The following studies were reviewed today:  Echo 07/03/17 Severe conc LVH, EF 60-65, no RWMA, Gr 2 DD, MAC, mild MR  Carotid US 2/17 Bilateral plaque without hemodynamically significant stenosis  Carotid US  7/14 Bilateral ICA 40-59  Myoview 9/12 EF 44, no ischemia  Past Medical History:  Diagnosis Date  . Anxiety    doesn't take any meds for this  . Arthritis    knuckles  . Asthma   . Carotid artery disease ([place])    Carotid US 7/14: Bilateral ICA 40-59 // Carotid US ([facility]) 2/17: bilat plaque without sig stenosis  . Claustrophobia 07/26/2012  . COPD (chronic obstructive pulmonary disease) ([place])   . Diverticulosis   . Dizziness    related to CSF leakage  . Headache(784.0)    related to CSF leakage  . History of chronic bronchitis 07/26/2012   "just about q winter"  . HTN (hypertension)    takes Amlodipine daily  . Hx of gout   . Hyperlipidemia    takes Pravastatin daily  . Internal hemorrhoids   . Kidney stones   . LVH (left ventricular hypertrophy) due to hypertensive disease, with heart failure ([place])    Echo 8/18: Severe conc LVH, EF 60-65, no RWMA, Gr 2 DD, MAC, mild MR  . OSA (obstructive sleep apnea) 07/26/2012   denies CPAP  . Stroke [facility]) 03/2004   denies residual 07/26/2012  . Type II diabetes mellitus ([place])     Past Surgical History:  Procedure Laterality Date  . ESOPHAGOGASTRODUODENOSCOPY (EGD) WITH PROPOFOL N/A 07/13/2015   Procedure: ESOPHAGOGASTRODUODENOSCOPY (EGD) WITH PROPOFOL;  Surgeon: Carol Ada, MD;  Location: [facility];  Service: Endoscopy;  Laterality: N/A;  . NASAL SINUS SURGERY  01/29/2012   Procedure: ENDOSCOPIC SINUS SURGERY WITH STEALTH;  Surgeon: Ruby Cola, MD;  Location: [facility];  Service: ENT;  Laterality: N/A;  . NM MYOVIEW LTD  08/25/2011   Low risk study, no evidence of ischemia, EF 44%  . REFRACTIVE SURGERY  ~ 2010   left  . SPHENOIDECTOMY  01/29/2012   Procedure: SPHENOIDECTOMY;  Surgeon: Ruby Cola, MD;  Location: [facility];  Service: ENT;  Laterality: Left;  REPAIR OF LEFT SPHENOID    . TOTAL ABDOMINAL HYSTERECTOMY  1994    Current Medications: Current Meds  Medication Sig  . acetaminophen (TYLENOL) 325 MG tablet Take 650 mg by  mouth every 6 (six) hours as needed for mild pain.  Marland Kitchen albuterol (VENTOLIN HFA) 108 (90 Base) MCG/ACT inhaler Inhale 1 puff into the lungs 3 (three) times daily as needed for wheezing.  . baclofen (LIORESAL) 10 MG tablet Take 10 mg by mouth 2 (two) times daily.   . busPIRone (BUSPAR) 15 MG tablet TAKE 1 TABLET BY MOUTH 3 TIMES A DAY  . CRESTOR 20 MG tablet Take 20 mg by mouth daily.   Marland Kitchen FLUoxetine (PROZAC) 10 MG capsule Take 4 capsules (40 mg total) by mouth at bedtime.  . gabapentin (NEURONTIN) 400 MG capsule Take 1 capsule (400 mg total) by mouth 3 (three) times daily.  Marland Kitchen glycopyrrolate (ROBINUL) 2 MG tablet Take 1 tablet (2 mg total) by mouth 2 (two) times daily.  . insulin aspart (NOVOLOG FLEXPEN) 100 UNIT/ML FlexPen Inject 30-40 Units into the skin 3 (three) times daily with meals.  . insulin glargine (LANTUS) 100 UNIT/ML injection INJECT 120 UNITS INTO THE SKIN IN THE MORNING AND 100 UNITS INTO THE SKIN AT BEDTIME  . levalbuterol (XOPENEX HFA) 45 MCG/ACT inhaler Inhale 1 puff into the lungs every 4 (four) hours as needed for wheezing.  . methocarbamol (ROBAXIN) 750 MG tablet Take 750 mg by mouth every 6 (six) hours as needed for muscle spasms.  Marland Kitchen omeprazole (PRILOSEC) 40 MG capsule TAKE ONE CAPSULE BY MOUTH EVERY DAY  . topiramate (TOPAMAX) 25 MG tablet Take one tablet by mouth twice daily  . valsartan (DIOVAN) 80 MG tablet Take 1 tablet (80 mg total) by mouth daily.  . [DISCONTINUED] amLODipine (NORVASC) 10 MG tablet Take 10 mg by mouth daily.   . [DISCONTINUED] aspirin 325 MG buffered tablet Take 325 mg by mouth daily.  . [DISCONTINUED] hydrochlorothiazide (HYDRODIURIL) 12.5 MG tablet Take 1 tablet (12.5 mg total) by mouth daily.  . [DISCONTINUED] verapamil (CALAN-SR) 240 MG CR tablet Take 1 tablet (240 mg total) by mouth daily.     Allergies:   Atorvastatin; Ibuprofen; Metformin and related; Ace inhibitors; and Hydrocodone-acetaminophen   Social History   Social History  . Marital  status: Single    Spouse name: N/A  . Number of children: 0  . Years of education: N/A   Occupational History  . disabled    Social History Main Topics  . Smoking status: Current Every Day Smoker    Packs/day: 0.50    Years: 35.00    Types: Cigarettes  . Smokeless tobacco: Never Used     Comment: 07/26/2012 has decreased smoking from 2ppd; offered cessation materials; pt declines  . Alcohol use 0.6 oz/week    1 Glasses of wine per week     Comment: 3 beers monthly  . Drug use: No  . Sexual activity: No   Other Topics Concern  . None  Social History Narrative   Currently unemployed.  Really wants to find a job, but has been difficult for her.  No children, not married     Family Hx: The patient's family history includes Alzheimer's disease in her mother; Breast cancer in her sister; Breast cancer (age of onset: 81) in her mother; Cancer in her sister; Colon cancer (age of onset: 16) in her sister; Diabetes (age of onset: 23) in her sister; Diabetes (age of onset: 68) in her sister; Heart failure in her sister; Lung cancer in her father. There is no history of Anesthesia problems, Hypotension, Malignant hyperthermia, or Pseudochol deficiency.  ROS:   Please see the history of present illness.    Review of Systems  Cardiovascular: Positive for dyspnea on exertion, irregular heartbeat and leg swelling.  Respiratory: Positive for cough, shortness of breath, snoring and wheezing.   Musculoskeletal: Positive for back pain and myalgias.  Gastrointestinal: Positive for nausea and vomiting.  Neurological: Positive for headaches.   All other systems reviewed and are negative.   EKGs/Labs/Other Test Reviewed:    EKG:  EKG is  ordered today.  The ekg ordered today demonstrates NSR, HR 85, leftward axis, T-wave inversion 1, aVL, QTc 478 ms, no significant change when compared to prior tracings  Recent Labs: 06/18/2017: TSH 2.800 06/22/2017: BUN 10; Creatinine, Ser 0.98; Hemoglobin  15.7; Platelets 176; Potassium 3.3; Sodium 141   Recent Lipid Panel Lab Results  Component Value Date/Time   CHOL 216 (H) 12/23/2013 11:54 AM   TRIG 177 (H) 12/23/2013 11:54 AM   HDL 41 12/23/2013 11:54 AM   CHOLHDL 5.3 12/23/2013 11:54 AM   LDLCALC 140 (H) 12/23/2013 11:54 AM   LDLDIRECT 203 (H) 06/20/2011 02:04 PM    Physical Exam:    VS:  BP (!) 170/48   Pulse 85   Ht 5' 2.5" (1.588 m)   Wt 252 lb (114.3 kg)   SpO2 93%   BMI 45.36 kg/m     Wt Readings from Last 3 Encounters:  07/13/17 252 lb (114.3 kg)  06/22/17 248 lb (112.5 kg)  03/12/17 248 lb (112.5 kg)     Physical Exam  Constitutional: She is oriented to person, place, and time. She appears well-developed and well-nourished. No distress.  HENT:  Head: Normocephalic and atraumatic.  Eyes: No scleral icterus.  Neck: No JVD (I cannot assess JVD) present.  Cardiovascular: Normal rate and regular rhythm.   Murmur heard.  Midsystolic murmur is present with a grade of 2/6  at the lower left sternal border Pulmonary/Chest: She has no wheezes.  Faint crackles at the bases  Abdominal: She exhibits distension. There is no hepatomegaly.  Musculoskeletal: She exhibits edema (1+ bilat LE edema).  Neurological: She is alert and oriented to person, place, and time.  Skin: Skin is warm and dry.  Psychiatric: She has a normal mood and affect.    ASSESSMENT:    1. Precordial pain   2. Chronic diastolic CHF (congestive heart failure) ([place])   3. Hypertensive heart disease with chronic diastolic congestive heart failure (HCC)   4. Paroxysmal SVT (supraventricular tachycardia) ([place])   5. Bilateral carotid artery disease ([place])   6. Chronic obstructive pulmonary disease, unspecified COPD type ([place])   7. LVH (left ventricular hypertrophy) due to hypertensive disease, with heart failure ([place])   8. Morbid obesity ([place])    PLAN:    In order of problems listed above:  1. Precordial pain -  The patient has a 2-3 mos hx  of  chest pain with assoc shortness of breath with activity.  She is certainly at risk for CAD given her hx of DM2 and smoking.  But, she had a recent trip to the ED with SVT and her Troponins were just minimally elevated, c/w demand ischemia.  If she had a high grade stenosis, I would have expected her Troponin to be much higher.  Her ECG demonstrates TWI in 1, aVL but this is unchanged over several years.  She has symptoms of congestive heart failure and has COPD.  Her symptoms may be explained by congestive heart failure, COPD or angina.    -  Arrange Lexiscan Myoview to r/o ischemic heart disease  -  Continue ASA (she can reduce to 81 mg QD), statin  2. Chronic diastolic CHF She has symptoms of congestive heart failure with severe LVH and mod diastolic dysfunction on recent Echo.  I think that congestive heart failure explains a lot of her symptoms.  Her exam is difficult, but she has paroxysmal nocturnal dyspnea and recent weight gain.    -  DC HCTZ  -  Start Lasix 40 QD  -  Start K+ 20 mEq QD  -  BMET, BNP today  -  BMET 1 week  3. Hypertensive heart disease with chronic diastolic congestive heart failure (HCC) -  BP uncontrolled.  But, she has not taken any medications today.  I will adjust her Verapamil further as outlined below.  She does not need to be on Amlodipine along with Verapamil.    -  DC Amlodipine  -  Increase Verapamil to 300 mg QD  4. Paroxysmal SVT (supraventricular tachycardia) (HCC) -  Minimal symptoms since starting Verapamil and stopping Albuterol.  Increase Verapamil to 300 mg QD.  Consider referral to EP if symptoms continue.    5. Bilateral carotid artery disease ([place]) Managed by PCP.  Dopplers in 2017 without significant stenosis.  6. Chronic obstructive pulmonary disease, unspecified COPD type ([place]) -  We discussed the importance of smoking cessation.  She has a lot of symptoms of COPD and used to see Pulmonology years ago.    -  DC Cigs  -  Refer to Pulmonology    7. LVH (left ventricular hypertrophy) due to hypertensive disease, with heart failure ([place]) She has severe LVH on recent echo but no mention of LVOT obstruction.  Continue Verapamil.  She needs aggressive BP control.  Consider increasing angiotensin receptor blocker at follow up.   8. Morbid Obesity Her weight is likely impacting her symptoms and increases her risk of congestive heart failure.   Dispo:  Return in about 2 weeks (around 07/27/2017) for Close Follow Up, w/ Dr. Harrington Challenger, or Richardson Dopp, PA-C.   Medication Adjustments/Labs and Tests Ordered: Current medicines are reviewed at length with the patient today.  Concerns regarding medicines are outlined above.  Tests Ordered: Orders Placed This Encounter  Procedures  . Basic Metabolic Panel (BMET)  . Pro b natriuretic peptide  . Basic Metabolic Panel (BMET)  . Ambulatory referral to Pulmonology  . Myocardial Perfusion Imaging  . EKG 12-Lead   Medication Changes: Meds ordered this encounter  Medications  . aspirin EC 81 MG tablet    Sig: Take 1 tablet (81 mg total) by mouth daily.    Dispense:  90 tablet    Refill:  3  . Verapamil HCl CR 300 MG CP24    Sig: Take 1 capsule (300 mg total) by mouth daily.    Dispense:  90 capsule    Refill:  3    DOSE INCREASE  . furosemide (LASIX) 40 MG tablet    Sig: Take 1 tablet (40 mg total) by mouth daily.    Dispense:  90 tablet    Refill:  3  . potassium chloride SA (KLOR-CON M20) 20 MEQ tablet    Sig: Take 1 tablet (20 mEq total) by mouth daily.    Dispense:  90 tablet    Refill:  3    Signed, Richardson Dopp, PA-C  07/13/2017 [facility] [place], [place], [place]  [address] Phone: [phone]; Fax: [phone]

## 2017-07-14 ENCOUNTER — Telehealth: Payer: Self-pay | Admitting: *Deleted

## 2017-07-14 LAB — BASIC METABOLIC PANEL
BUN/Creatinine Ratio: 13 (ref 12–28)
BUN: 16 mg/dL (ref 8–27)
CO2: 27 mmol/L (ref 20–29)
Calcium: 9.2 mg/dL (ref 8.7–10.3)
Chloride: 94 mmol/L — ABNORMAL LOW (ref 96–106)
Creatinine, Ser: 1.22 mg/dL — ABNORMAL HIGH (ref 0.57–1.00)
GFR calc Af Amer: 56 mL/min/{1.73_m2} — ABNORMAL LOW (ref >59)
GFR calc non Af Amer: 48 mL/min/{1.73_m2} — ABNORMAL LOW (ref >59)
Glucose: 354 mg/dL — ABNORMAL HIGH (ref 65–99)
Potassium: 4 mmol/L (ref 3.5–5.2)
Sodium: 139 mmol/L (ref 134–144)

## 2017-07-14 LAB — PRO B NATRIURETIC PEPTIDE: NT-Pro BNP: 1585 pg/mL — ABNORMAL HIGH (ref 0–287)

## 2017-07-14 NOTE — Telephone Encounter (Signed)
-----   Message from Liliane Shi, [place] sent at 07/14/2017 11:36 AM EDT ----- Please call the patient Glucose is way too high - follow up with PCP Kidney function is stable. BNP is high as expected. Continue with Lasix as directed at office visit yesterday. Richardson Dopp, PA-C    07/14/2017 11:35 AM

## 2017-07-14 NOTE — Telephone Encounter (Signed)
Lmtcb to go over lab results 

## 2017-07-15 ENCOUNTER — Telehealth (HOSPITAL_COMMUNITY): Payer: Self-pay

## 2017-07-15 NOTE — Telephone Encounter (Signed)
Follow UP   Pt calling back for the results

## 2017-07-15 NOTE — Telephone Encounter (Signed)
-----   Message from Liliane Shi, [place] sent at 07/14/2017 11:36 AM EDT ----- Please call the patient Glucose is way too high - follow up with PCP Kidney function is stable. BNP is high as expected. Continue with Lasix as directed at office visit yesterday. Richardson Dopp, PA-C    07/14/2017 11:35 AM

## 2017-07-15 NOTE — Telephone Encounter (Signed)
Pt has been notified lab results. Pt has been advised to f/u with PCP about glucose level too high. Pt agreeable to plan of care and thanked me for my call.

## 2017-07-15 NOTE — Telephone Encounter (Signed)
Pt contacted and given instructions about her [place] Nuclear Stress Test. Pt stated that she understood and would be here for her appointment. S.Aileena Iglesia EMTP

## 2017-07-16 ENCOUNTER — Ambulatory Visit: Payer: Medicare Other | Admitting: Podiatry

## 2017-07-21 ENCOUNTER — Other Ambulatory Visit: Payer: Medicare Other

## 2017-07-21 ENCOUNTER — Ambulatory Visit (HOSPITAL_COMMUNITY): Payer: Medicare Other | Attending: Cardiovascular Disease

## 2017-07-21 DIAGNOSIS — R072 Precordial pain: Secondary | ICD-10-CM | POA: Diagnosis not present

## 2017-07-21 DIAGNOSIS — R9439 Abnormal result of other cardiovascular function study: Secondary | ICD-10-CM | POA: Insufficient documentation

## 2017-07-21 DIAGNOSIS — I2510 Atherosclerotic heart disease of native coronary artery without angina pectoris: Secondary | ICD-10-CM | POA: Insufficient documentation

## 2017-07-21 DIAGNOSIS — R0609 Other forms of dyspnea: Secondary | ICD-10-CM | POA: Diagnosis not present

## 2017-07-21 DIAGNOSIS — R002 Palpitations: Secondary | ICD-10-CM | POA: Diagnosis not present

## 2017-07-21 DIAGNOSIS — I10 Essential (primary) hypertension: Secondary | ICD-10-CM | POA: Diagnosis not present

## 2017-07-21 DIAGNOSIS — I5032 Chronic diastolic (congestive) heart failure: Secondary | ICD-10-CM | POA: Diagnosis not present

## 2017-07-21 LAB — BASIC METABOLIC PANEL
BUN/Creatinine Ratio: 12 (ref 12–28)
BUN: 16 mg/dL (ref 8–27)
CO2: 26 mmol/L (ref 20–29)
Calcium: 8 mg/dL — ABNORMAL LOW (ref 8.7–10.3)
Chloride: 96 mmol/L (ref 96–106)
Creatinine, Ser: 1.31 mg/dL — ABNORMAL HIGH (ref 0.57–1.00)
GFR calc Af Amer: 51 mL/min/{1.73_m2} — ABNORMAL LOW (ref >59)
GFR calc non Af Amer: 44 mL/min/{1.73_m2} — ABNORMAL LOW (ref >59)
Glucose: 171 mg/dL — ABNORMAL HIGH (ref 65–99)
Potassium: 3.7 mmol/L (ref 3.5–5.2)
Sodium: 140 mmol/L (ref 134–144)

## 2017-07-21 MED ORDER — TECHNETIUM TC 99M TETROFOSMIN IV KIT
32.7000 | PACK | Freq: Once | INTRAVENOUS | Status: AC | PRN
  Administered 2017-07-21: 32.7 via INTRAVENOUS
  Filled 2017-07-21: qty 33

## 2017-07-21 MED ORDER — REGADENOSON 0.4 MG/5ML IV SOLN
0.4000 mg | Freq: Once | INTRAVENOUS | Status: AC
  Administered 2017-07-21: 0.4 mg via INTRAVENOUS

## 2017-07-22 ENCOUNTER — Telehealth: Payer: Self-pay | Admitting: *Deleted

## 2017-07-22 ENCOUNTER — Ambulatory Visit (HOSPITAL_COMMUNITY): Payer: Medicare Other | Attending: Cardiovascular Disease

## 2017-07-22 LAB — MYOCARDIAL PERFUSION IMAGING
LV dias vol: 153 mL (ref 46–106)
LV sys vol: 66 mL
Peak HR: 80 {beats}/min
RATE: 0.31
Rest HR: 73 {beats}/min
SDS: 3
SRS: 0
SSS: 3
TID: 0.94

## 2017-07-22 MED ORDER — TECHNETIUM TC 99M TETROFOSMIN IV KIT
31.6000 | PACK | Freq: Once | INTRAVENOUS | Status: AC | PRN
  Administered 2017-07-22: 31.6 via INTRAVENOUS
  Filled 2017-07-22: qty 32

## 2017-07-22 NOTE — Telephone Encounter (Signed)
-----   Message from Liliane Shi, [place] sent at 07/21/2017  9:28 PM EDT ----- Please call the patient Kidney function is stable. The potassium is normal. The calcium is low. Continue current medications. Follow up with PCP for low Calcium - send labs to PCP.  Richardson Dopp, PA-C    07/21/2017 9:26 PM

## 2017-07-22 NOTE — Telephone Encounter (Signed)
I tried to reach pt to go over her lab results, though no answer.

## 2017-07-22 NOTE — Telephone Encounter (Signed)
Lmtcb to go over lab results 

## 2017-07-23 ENCOUNTER — Encounter: Payer: Self-pay | Admitting: Physician Assistant

## 2017-07-26 DIAGNOSIS — I5032 Chronic diastolic (congestive) heart failure: Secondary | ICD-10-CM

## 2017-07-26 DIAGNOSIS — I110 Hypertensive heart disease with heart failure: Secondary | ICD-10-CM | POA: Insufficient documentation

## 2017-07-26 DIAGNOSIS — I471 Supraventricular tachycardia: Secondary | ICD-10-CM | POA: Insufficient documentation

## 2017-07-26 HISTORY — DX: Hypertensive heart disease with heart failure: I11.0

## 2017-07-26 HISTORY — DX: Chronic diastolic (congestive) heart failure: I50.32

## 2017-07-26 NOTE — Progress Notes (Signed)
Cardiology Office Note:    Date:  07/27/2017   ID:  [name], DOB [birth year], MRN [id number]  PCP:  Rich Fuchs, PA  Cardiologist:  Dr. Dorris Carnes    Referring MD: Rich Fuchs, PA   Chief Complaint  Patient presents with  . Follow-up    Chest pain, dyspnea    History of Present Illness:    [name] is a 61 y.o. female with a hx of diastolic HF, PSVT, prior CVA x 2, HTN, IDDM, COPD, OSA, CSF rhinorrhea (2/2 encephalocele) s/p skull base reconstruction.  She was evaluated in the emergency room twice in July for PSVT. She was converted to NSR with adenosine 06/18/17. She did have elevated troponins consistent with demand ischemia. She was discharged on metoprolol but stopped this secondary to nausea. On 7/16, she had recurrent symptoms and converted to NSR with vagal maneuvers. Of note, she was using her Albuterol inhaler quite often.  She was seen by Dr. Sallyanne Kuster in consultation who placed her on Verapamil.  I saw her in the office earlier this month.  She complained of dyspnea on exertion and exertional chest heaviness.  I placed her on Lasix for volume overload and arranged a Nuclear stress test to rule out ischemia.  The Nuclear stress test was low risk but did show a small inferior perfusion defect that could possibly be ischemia.   [name] returns for Cardiology follow up.  She is here with her sister.  She does not feel any better.  She is still short of breath with minimal activity and is experiencing chest pressure with minimal activity. She is nauseated at times and also diaphoretic.  She denies syncope.  She notes paroxysmal nocturnal dyspnea, edema.  She is limiting her salt. She has been drinking "a lot" of water.  She is still smoking.  She sees Pulmonology next month.    Prior CV studies:   The following studies were reviewed today:  Nuclear stress test 07/22/17 1. EF 57%, normal wall motion.  2. Reversible small, mild basal inferior perfusion  defect.  Possible small area of ischemia.  Low risk study.   Echo 07/03/17 Severe conc LVH, EF 60-65, no RWMA, Gr 2 DD, MAC, mild MR  Carotid US 2/17 Bilateral plaque without hemodynamically significant stenosis  Carotid US 7/14 Bilateral ICA 40-59  Myoview 9/12 EF 44, no ischemia  Past Medical History:  Diagnosis Date  . Anxiety    doesn't take any meds for this  . Arthritis    knuckles  . Asthma   . Carotid artery disease ([place])    Carotid US 7/14: Bilateral ICA 40-59 // Carotid US ([facility]) 2/17: bilat plaque without sig stenosis  . Claustrophobia 07/26/2012  . COPD (chronic obstructive pulmonary disease) ([place])   . Diverticulosis   . Dizziness    related to CSF leakage  . Headache(784.0)    related to CSF leakage  . History of chronic bronchitis 07/26/2012   "just about q winter"  . History of nuclear stress test    Myoview 8/18: EF 57%, normal wall motion. Reversible small, mild basal inferior perfusion defect.  Possible small area of ischemia. Low risk study.   [place] Kitchen HTN (hypertension)    takes Amlodipine daily  . Hx of gout   . Hyperlipidemia    takes Pravastatin daily  . Internal hemorrhoids   . Kidney stones   . LVH (left ventricular hypertrophy) due to hypertensive disease, with heart failure ([place])  Echo 8/18: Severe conc LVH, EF 60-65, no RWMA, Gr 2 DD, MAC, mild MR  . OSA (obstructive sleep apnea) 07/26/2012   denies CPAP  . Stroke (HCC) 03/2004   denies residual 07/26/2012  . Type II diabetes mellitus (HCC)     Past Surgical History:  Procedure Laterality Date  . ESOPHAGOGASTRODUODENOSCOPY (EGD) WITH PROPOFOL N/A 07/13/2015   Procedure: ESOPHAGOGASTRODUODENOSCOPY (EGD) WITH PROPOFOL;  Surgeon: Patrick Hung, MD;  Location: [facility];  Service: Endoscopy;  Laterality: N/A;  . NASAL SINUS SURGERY  01/29/2012   Procedure: ENDOSCOPIC SINUS SURGERY WITH STEALTH;  Surgeon: Mitchell Gore, MD;  Location: [facility] OR;  Service: ENT;  Laterality: N/A;  . NM MYOVIEW LTD   08/25/2011   Low risk study, no evidence of ischemia, EF 44%  . REFRACTIVE SURGERY  ~ 2010   left  . SPHENOIDECTOMY  01/29/2012   Procedure: SPHENOIDECTOMY;  Surgeon: Mitchell Gore, MD;  Location: [facility] OR;  Service: ENT;  Laterality: Left;  REPAIR OF LEFT SPHENOID    . TOTAL ABDOMINAL HYSTERECTOMY  1994    Current Medications: Current Meds  Medication Sig  . acetaminophen (TYLENOL) 325 MG tablet Take 650 mg by mouth every 6 (six) hours as needed for mild pain.  . albuterol (VENTOLIN HFA) 108 (90 Base) MCG/ACT inhaler Inhale 1 puff into the lungs 3 (three) times daily as needed for wheezing.  . aspirin EC 81 MG tablet Take 1 tablet (81 mg total) by mouth daily.  . baclofen (LIORESAL) 10 MG tablet Take 10 mg by mouth 2 (two) times daily.   . busPIRone (BUSPAR) 15 MG tablet TAKE 1 TABLET BY MOUTH 3 TIMES A DAY  . CRESTOR 20 MG tablet Take 20 mg by mouth daily.   . FLUoxetine (PROZAC) 10 MG capsule Take 4 capsules (40 mg total) by mouth at bedtime.  . furosemide (LASIX) 40 MG tablet Take 1 tablet (40 mg total) by mouth daily.  . gabapentin (NEURONTIN) 400 MG capsule Take 1 capsule (400 mg total) by mouth 3 (three) times daily.  . glycopyrrolate (ROBINUL) 2 MG tablet Take 1 tablet (2 mg total) by mouth 2 (two) times daily.  . insulin aspart (NOVOLOG FLEXPEN) 100 UNIT/ML FlexPen Inject 30-40 Units into the skin 3 (three) times daily with meals.  . insulin glargine (LANTUS) 100 UNIT/ML injection INJECT 120 UNITS INTO THE SKIN IN THE MORNING AND 100 UNITS INTO THE SKIN AT BEDTIME  . levalbuterol (XOPENEX HFA) 45 MCG/ACT inhaler Inhale 1 puff into the lungs every 4 (four) hours as needed for wheezing.  . methocarbamol (ROBAXIN) 750 MG tablet Take 750 mg by mouth every 6 (six) hours as needed for muscle spasms.  . omeprazole (PRILOSEC) 40 MG capsule TAKE ONE CAPSULE BY MOUTH EVERY DAY  . potassium chloride SA (KLOR-CON M20) 20 MEQ tablet Take 1 tablet (20 mEq total) by mouth daily.  . topiramate  (TOPAMAX) 25 MG tablet Take one tablet by mouth twice daily  . Verapamil HCl CR 300 MG CP24 Take 1 capsule (300 mg total) by mouth daily.  . [DISCONTINUED] valsartan (DIOVAN) 80 MG tablet Take 1 tablet (80 mg total) by mouth daily.     Allergies:   Atorvastatin; Ibuprofen; Metformin and related; Ace inhibitors; and Hydrocodone-acetaminophen   Social History   Social History  . Marital status: Single    Spouse name: N/A  . Number of children: 0  . Years of education: N/A   Occupational History  . disabled    Social History Main   Topics  . Smoking status: Current Every Day Smoker    Packs/day: 0.50    Years: 35.00    Types: Cigarettes  . Smokeless tobacco: Never Used     Comment: 07/26/2012 has decreased smoking from 2ppd; offered cessation materials; pt declines  . Alcohol use 0.6 oz/week    1 Glasses of wine per week     Comment: 3 beers monthly  . Drug use: No  . Sexual activity: No   Other Topics Concern  . None   Social History Narrative   Currently unemployed.  Really wants to find a job, but has been difficult for her.  No children, not married     Family Hx: The patient's family history includes Alzheimer's disease in her mother; Breast cancer in her sister; Breast cancer (age of onset: 63) in her mother; Cancer in her sister; Colon cancer (age of onset: 49) in her sister; Diabetes (age of onset: 45) in her sister; Diabetes (age of onset: 49) in her sister; Heart failure in her sister; Lung cancer in her father. There is no history of Anesthesia problems, Hypotension, Malignant hyperthermia, or Pseudochol deficiency.  ROS:   Please see the history of present illness.    Review of Systems  Constitution: Positive for chills and diaphoresis.  Cardiovascular: Positive for dyspnea on exertion, irregular heartbeat and leg swelling.  Respiratory: Positive for cough, shortness of breath, snoring and wheezing.   Musculoskeletal: Positive for back pain, joint swelling and  myalgias.  Gastrointestinal: Positive for diarrhea, nausea and vomiting.  Neurological: Positive for dizziness, headaches and loss of balance.  Psychiatric/Behavioral: Positive for depression. The patient is nervous/anxious.    All other systems reviewed and are negative.   EKGs/Labs/Other Test Reviewed:    EKG:  EKG is  ordered today.  The ekg ordered today demonstrates NSR, HR 80, RAD, septal Q waves, NSSTTW changes, QTc 486 ms  Recent Labs: 06/18/2017: TSH 2.800 06/22/2017: Hemoglobin 15.7; Platelets 176 07/13/2017: NT-Pro BNP 1,585 07/21/2017: BUN 16; Creatinine, Ser 1.31; Potassium 3.7; Sodium 140   Recent Lipid Panel Lab Results  Component Value Date/Time   CHOL 216 (H) 12/23/2013 11:54 AM   TRIG 177 (H) 12/23/2013 11:54 AM   HDL 41 12/23/2013 11:54 AM   CHOLHDL 5.3 12/23/2013 11:54 AM   LDLCALC 140 (H) 12/23/2013 11:54 AM   LDLDIRECT 203 (H) 06/20/2011 02:04 PM    Physical Exam:    VS:  BP (!) 152/78   Pulse 84   Ht 5' 2" (1.575 m)   Wt 252 lb 6.4 oz (114.5 kg)   SpO2 97%   BMI 46.16 kg/m     Wt Readings from Last 3 Encounters:  07/27/17 252 lb 6.4 oz (114.5 kg)  07/21/17 252 lb (114.3 kg)  07/13/17 252 lb (114.3 kg)     Physical Exam  Constitutional: She is oriented to person, place, and time. She appears well-developed and well-nourished. No distress.  HENT:  Head: Normocephalic and atraumatic.  Eyes: No scleral icterus.  Neck: No JVD (I cannot assess JVD) present.  Cardiovascular: Normal rate and regular rhythm.   Murmur heard.  Holosystolic murmur is present with a grade of 2/6  at the lower left sternal border Pulmonary/Chest: She has decreased breath sounds. She has no wheezes. She has no rales.  Abdominal: She exhibits distension.  Musculoskeletal: She exhibits edema (trace-1+ bilat LE edema).  Neurological: She is alert and oriented to person, place, and time.  Skin: Skin is warm and dry.  Psychiatric:   She has a normal mood and affect.     ASSESSMENT:    1. Angina pectoris ([place])   2. Chronic diastolic CHF (congestive heart failure) ([place])   3. Hypertensive heart disease with chronic diastolic congestive heart failure (HCC)   4. Paroxysmal SVT (supraventricular tachycardia) ([place])   5. CKD (chronic kidney disease) stage 3, GFR 30-59 ml/min   6. Chronic obstructive pulmonary disease, unspecified COPD type ([place])    PLAN:    In order of problems listed above:  1. Angina pectoris ([place]) She continues to experience chest pressure that is suspicious for angina. Her recent nuclear stress test was low risk but did demonstrate a minimal area of possible inferior ischemia. She has several risk factors for coronary artery disease including smoking history and diabetes. ECG remains unchanged. She is essentially describing CCS class III angina. She is intol of beta-blockers. I have recommended proceeding with Cardiac Catheterization to further evaluate her symptoms.  I reviewed this with Dr. Dorris Carnes who agreed.  Risks and benefits of cardiac catheterization have been discussed with the patient.  These include bleeding, infection, kidney damage, stroke, heart attack, death.  The patient understands these risks and is willing to proceed.   -  I will add isosorbide 30 mg daily.  -  Rx for prn NTG given  -  Continue ASA, Verapamil, Statin  -  Arrange R/L Heart Cath w/ Angio later this week  -  She knows to go to the ED for any change in her symptoms.   2. Chronic diastolic CHF (congestive heart failure) ([place])  She has not had any weight loss or improvement in edema with Furosemide.  Her exam is difficult but she appears to remain volume overloaded.  Question if her chest pain is related to congestive heart failure vs angina vs pulmonary disease.  As noted, R/L heart cath will be obtained.  Continue current dose of Lasix.  This can be adjusted if her filling pressures are high at cath.   3. Hypertensive heart disease with chronic diastolic  congestive heart failure (HCC) BP above target. She stopped Valsartan recently due to the recall.  We can consider an alternative angiotensin receptor blocker after her Cardiac Catheterization.  Add Isosorbide as noted.  Continue Verapamil.    4. Paroxysmal SVT (supraventricular tachycardia) (HCC) No recurrent symptoms.  Continue calcium channel blocker.  5. CKD  Creatinine 1.2-1.3.  Will hold Lasix AM of cath.  Would avoid LV gram to limit contrast as well.  6. Chronic obstructive pulmonary disease, unspecified COPD type ([place]) She understands that she needs to quit smoking. She sees Pulmonology soon.  As noted, R heart cath will also be performed.    Dispo:  Return in about 2 weeks (around 08/10/2017) for Post Procedure Follow Up, w/ Dr. Harrington Challenger, or Richardson Dopp, PA-C.    Medication Adjustments/Labs and Tests Ordered: Current medicines are reviewed at length with the patient today.  Concerns regarding medicines are outlined above.  Tests Ordered: Orders Placed This Encounter  Procedures  . Basic Metabolic Panel (BMET)  . CBC  . INR/PT  . EKG 12-Lead   Medication Changes: Meds ordered this encounter  Medications  . isosorbide mononitrate (IMDUR) 30 MG 24 hr tablet    Sig: Take 1 tablet (30 mg total) by mouth daily.    Dispense:  90 tablet    Refill:  3  . nitroGLYCERIN (NITROSTAT) 0.4 MG SL tablet    Sig: Place 1 tablet (0.4 mg total) under the  tongue every 5 (five) minutes as needed.    Dispense:  25 tablet    Refill:  3    Signed, Richardson Dopp, PA-C  07/27/2017 12:06 PM    [facility], [place], [place]  [address] Phone: [phone]; Fax: [phone]

## 2017-07-27 ENCOUNTER — Encounter: Payer: Self-pay | Admitting: Physician Assistant

## 2017-07-27 ENCOUNTER — Ambulatory Visit (INDEPENDENT_AMBULATORY_CARE_PROVIDER_SITE_OTHER): Payer: Medicare Other | Admitting: Physician Assistant

## 2017-07-27 VITALS — BP 152/78 | HR 84 | Ht 62.0 in | Wt 252.4 lb

## 2017-07-27 DIAGNOSIS — I5032 Chronic diastolic (congestive) heart failure: Secondary | ICD-10-CM | POA: Diagnosis not present

## 2017-07-27 DIAGNOSIS — N183 Chronic kidney disease, stage 3 unspecified: Secondary | ICD-10-CM

## 2017-07-27 DIAGNOSIS — I110 Hypertensive heart disease with heart failure: Secondary | ICD-10-CM | POA: Diagnosis not present

## 2017-07-27 DIAGNOSIS — I471 Supraventricular tachycardia: Secondary | ICD-10-CM | POA: Diagnosis not present

## 2017-07-27 DIAGNOSIS — I209 Angina pectoris, unspecified: Secondary | ICD-10-CM

## 2017-07-27 DIAGNOSIS — J449 Chronic obstructive pulmonary disease, unspecified: Secondary | ICD-10-CM | POA: Diagnosis not present

## 2017-07-27 LAB — BASIC METABOLIC PANEL
BUN/Creatinine Ratio: 13 (ref 12–28)
BUN: 13 mg/dL (ref 8–27)
CO2: 26 mmol/L (ref 20–29)
Calcium: 9 mg/dL (ref 8.7–10.3)
Chloride: 98 mmol/L (ref 96–106)
Creatinine, Ser: 1.02 mg/dL — ABNORMAL HIGH (ref 0.57–1.00)
GFR calc Af Amer: 69 mL/min/{1.73_m2} (ref >59)
GFR calc non Af Amer: 60 mL/min/{1.73_m2} (ref >59)
Glucose: 151 mg/dL — ABNORMAL HIGH (ref 65–99)
Potassium: 3.8 mmol/L (ref 3.5–5.2)
Sodium: 142 mmol/L (ref 134–144)

## 2017-07-27 LAB — CBC
Hematocrit: 47 % — ABNORMAL HIGH (ref 34.0–46.6)
Hemoglobin: 16 g/dL — ABNORMAL HIGH (ref 11.1–15.9)
MCH: 31.3 pg (ref 26.6–33.0)
MCHC: 34 g/dL (ref 31.5–35.7)
MCV: 92 fL (ref 79–97)
Platelets: 242 10*3/uL (ref 150–379)
RBC: 5.12 x10E6/uL (ref 3.77–5.28)
RDW: 14.5 % (ref 12.3–15.4)
WBC: 10 10*3/uL (ref 3.4–10.8)

## 2017-07-27 LAB — PROTIME-INR
INR: 1 (ref 0.8–1.2)
Prothrombin Time: 10.6 s (ref 9.1–12.0)

## 2017-07-27 MED ORDER — ISOSORBIDE MONONITRATE ER 30 MG PO TB24: 30.0000 mg | ORAL_TABLET | Freq: Every day | ORAL | 3 refills | Status: DC

## 2017-07-27 MED ORDER — NITROGLYCERIN 0.4 MG SL SUBL: 0.4000 mg | SUBLINGUAL_TABLET | SUBLINGUAL | 3 refills | Status: AC | PRN

## 2017-07-27 NOTE — Patient Instructions (Addendum)
Medication Instructions:  1. START IMDUR 30 MG DAILY; RX HAS BEEN SENT IN  2. AN RX FOR NITROGLYCERIN HAS BEEN SENT IN AND YOU HAVE BEEN ADVISED AS TO HOW AND WHEN TO USE NTG  Labwork: 1. TODAY BMET. CBC, PT/INR  Testing/Procedures: 1. Your physician has requested that you have a cardiac catheterization. Cardiac catheterization is used to diagnose and/or treat various heart conditions. Doctors may recommend this procedure for a number of different reasons. The most common reason is to evaluate chest pain. Chest pain can be a symptom of coronary artery disease (CAD), and cardiac catheterization can show whether plaque is narrowing or blocking your heart's arteries. This procedure is also used to evaluate the valves, as well as measure the blood flow and oxygen levels in different parts of your heart. For further information please visit [email or web address]. Please follow instruction sheet, as given.    Follow-Up: 1. [name], PAC   Any Other Special Instructions Will Be Listed Below (If Applicable).  Limit your water intake to about 2 Liters (60 ounces).  Try to not go over this amount too much.  If you need a refill on your cardiac medications before your next appointment, please call your pharmacy.    [facility] OFFICE [address], [address] [place] [address] Dept: [phone] Loc: [phone]  [name]  07/27/2017  You are scheduled for a Cardiac Catheterization on Thursday, August 23 with Dr. Peter [name].  1. Please arrive at the [facility] (Main Entrance A) at [facility]: [address] [place], [place] [address] at 10:30 AM (two hours before your procedure to ensure your preparation). Free valet parking service is available.   Special note: Every effort is made to have your procedure done on time. Please understand that emergencies sometimes delay scheduled  procedures.  2. Diet: Do not eat or drink anything after midnight prior to your procedure except sips of water to take medications.  3. Labs: You will need to have blood drawn on Monday, August 20 at [facility] at [facility]. [address]  Open: 7:30am - 5pm    Phone: [phone]. You do not need to be fasting.  4. Medication instructions in preparation for your procedure:  HOLD LASIX AND POTASSIUM THE MORNING OF CATH   Take only 50 units of insulin the night before your procedure. Do not take any insulin on the day of the procedure.  On the morning of your procedure, take your Aspirin 81 and any morning medicines NOT listed above.  You may use sips of water.  5. Plan for one night stay--bring personal belongings. 6. Bring a current list of your medications and current insurance cards. 7. You MUST have a responsible person to drive you home. 8. Someone MUST be with you the first 24 hours after you arrive home or your discharge will be delayed. 9. Please wear clothes that are easy to get on and off and wear slip-on shoes.  Thank you for allowing [place] to care for you!   --  Invasive Cardiovascular services

## 2017-07-29 ENCOUNTER — Telehealth: Payer: Self-pay

## 2017-07-29 NOTE — Telephone Encounter (Signed)
Left message per DPR  Patient contacted pre-catheterization at [facility] scheduled for:  07/30/2017 @ 1230 Verified arrival time and place:  [facility] @ 1030 Confirmed AM meds to be taken pre-cath with sip of water: Take ASA Hold Lasix and Kdur morning of procedure Night before procedure take 1/2 dose of Lantus 50 units Hold all insulin AM of procedure Patient must have responsible person to drive home post procedure and observe patient for 24 hours Addl concerns: Left this nurse name and # if any further questions/concerns

## 2017-07-30 ENCOUNTER — Encounter (HOSPITAL_COMMUNITY)
Admission: RE | Disposition: A | Discharge status: Home / Self Care | Payer: Self-pay | Source: Ambulatory Visit | Attending: Cardiology

## 2017-07-30 ENCOUNTER — Ambulatory Visit (HOSPITAL_COMMUNITY)
Admission: RE | Admit: 2017-07-30 | Discharge: 2017-07-30 | Disposition: A | Discharge status: Home / Self Care | Payer: Medicare Other | Source: Ambulatory Visit | Attending: Cardiology | Admitting: Cardiology

## 2017-07-30 DIAGNOSIS — N183 Chronic kidney disease, stage 3 (moderate): Secondary | ICD-10-CM | POA: Diagnosis not present

## 2017-07-30 DIAGNOSIS — Z8673 Personal history of transient ischemic attack (TIA), and cerebral infarction without residual deficits: Secondary | ICD-10-CM | POA: Diagnosis not present

## 2017-07-30 DIAGNOSIS — Z7982 Long term (current) use of aspirin: Secondary | ICD-10-CM | POA: Insufficient documentation

## 2017-07-30 DIAGNOSIS — I272 Pulmonary hypertension, unspecified: Secondary | ICD-10-CM | POA: Insufficient documentation

## 2017-07-30 DIAGNOSIS — E1142 Type 2 diabetes mellitus with diabetic polyneuropathy: Secondary | ICD-10-CM | POA: Diagnosis present

## 2017-07-30 DIAGNOSIS — I130 Hypertensive heart and chronic kidney disease with heart failure and stage 1 through stage 4 chronic kidney disease, or unspecified chronic kidney disease: Secondary | ICD-10-CM | POA: Insufficient documentation

## 2017-07-30 DIAGNOSIS — G4733 Obstructive sleep apnea (adult) (pediatric): Secondary | ICD-10-CM | POA: Diagnosis not present

## 2017-07-30 DIAGNOSIS — Z79899 Other long term (current) drug therapy: Secondary | ICD-10-CM | POA: Insufficient documentation

## 2017-07-30 DIAGNOSIS — F17210 Nicotine dependence, cigarettes, uncomplicated: Secondary | ICD-10-CM | POA: Diagnosis not present

## 2017-07-30 DIAGNOSIS — I5032 Chronic diastolic (congestive) heart failure: Secondary | ICD-10-CM | POA: Diagnosis not present

## 2017-07-30 DIAGNOSIS — J449 Chronic obstructive pulmonary disease, unspecified: Secondary | ICD-10-CM | POA: Insufficient documentation

## 2017-07-30 DIAGNOSIS — E1122 Type 2 diabetes mellitus with diabetic chronic kidney disease: Secondary | ICD-10-CM | POA: Insufficient documentation

## 2017-07-30 DIAGNOSIS — M109 Gout, unspecified: Secondary | ICD-10-CM | POA: Diagnosis not present

## 2017-07-30 DIAGNOSIS — Z791 Long term (current) use of non-steroidal anti-inflammatories (NSAID): Secondary | ICD-10-CM | POA: Diagnosis not present

## 2017-07-30 DIAGNOSIS — I209 Angina pectoris, unspecified: Secondary | ICD-10-CM

## 2017-07-30 DIAGNOSIS — I10 Essential (primary) hypertension: Secondary | ICD-10-CM | POA: Diagnosis present

## 2017-07-30 DIAGNOSIS — I471 Supraventricular tachycardia, unspecified: Secondary | ICD-10-CM | POA: Diagnosis present

## 2017-07-30 DIAGNOSIS — Z794 Long term (current) use of insulin: Secondary | ICD-10-CM | POA: Diagnosis not present

## 2017-07-30 DIAGNOSIS — E785 Hyperlipidemia, unspecified: Secondary | ICD-10-CM | POA: Diagnosis not present

## 2017-07-30 DIAGNOSIS — I2510 Atherosclerotic heart disease of native coronary artery without angina pectoris: Secondary | ICD-10-CM | POA: Diagnosis present

## 2017-07-30 DIAGNOSIS — I25119 Atherosclerotic heart disease of native coronary artery with unspecified angina pectoris: Secondary | ICD-10-CM | POA: Diagnosis not present

## 2017-07-30 DIAGNOSIS — E782 Mixed hyperlipidemia: Secondary | ICD-10-CM | POA: Diagnosis present

## 2017-07-30 HISTORY — PX: RIGHT/LEFT HEART CATH AND CORONARY ANGIOGRAPHY: CATH118266

## 2017-07-30 LAB — POCT I-STAT 3, VENOUS BLOOD GAS (G3P V)
Acid-Base Excess: 6 mmol/L — ABNORMAL HIGH (ref 0.0–2.0)
Bicarbonate: 33 mmol/L — ABNORMAL HIGH (ref 20.0–28.0)
O2 Saturation: 55 %
TCO2: 35 mmol/L (ref 0–100)
pCO2, Ven: 54.3 mmHg (ref 44.0–60.0)
pH, Ven: 7.391 (ref 7.250–7.430)
pO2, Ven: 30 mmHg — CL (ref 32.0–45.0)

## 2017-07-30 LAB — POCT I-STAT 3, ART BLOOD GAS (G3+)
Acid-Base Excess: 4 mmol/L — ABNORMAL HIGH (ref 0.0–2.0)
Bicarbonate: 29.2 mmol/L — ABNORMAL HIGH (ref 20.0–28.0)
O2 Saturation: 90 %
TCO2: 31 mmol/L (ref 0–100)
pCO2 arterial: 45 mmHg (ref 32.0–48.0)
pH, Arterial: 7.42 (ref 7.350–7.450)
pO2, Arterial: 59 mmHg — ABNORMAL LOW (ref 83.0–108.0)

## 2017-07-30 LAB — GLUCOSE, CAPILLARY: Glucose-Capillary: 194 mg/dL — ABNORMAL HIGH (ref 65–99)

## 2017-07-30 SURGERY — RIGHT/LEFT HEART CATH AND CORONARY ANGIOGRAPHY
Anesthesia: LOCAL

## 2017-07-30 MED ORDER — SODIUM CHLORIDE 0.9 % WEIGHT BASED INFUSION: 1.0000 mL/kg/h | INTRAVENOUS | Status: DC

## 2017-07-30 MED ORDER — MIDAZOLAM HCL 2 MG/2ML IJ SOLN
INTRAMUSCULAR | Status: DC | PRN
  Administered 2017-07-30 (×3): 1 mg via INTRAVENOUS

## 2017-07-30 MED ORDER — LIDOCAINE HCL (PF) 1 % IJ SOLN
INTRAMUSCULAR | Status: AC
  Filled 2017-07-30: qty 30

## 2017-07-30 MED ORDER — LIDOCAINE HCL (PF) 1 % IJ SOLN
INTRAMUSCULAR | Status: DC | PRN
Start: 2017-07-30 — End: 2017-07-30
  Administered 2017-07-30: 4 mL via SUBCUTANEOUS

## 2017-07-30 MED ORDER — VERAPAMIL HCL 2.5 MG/ML IV SOLN
INTRAVENOUS | Status: AC
  Filled 2017-07-30: qty 2

## 2017-07-30 MED ORDER — MIDAZOLAM HCL 2 MG/2ML IJ SOLN
INTRAMUSCULAR | Status: AC
  Filled 2017-07-30: qty 2

## 2017-07-30 MED ORDER — ASPIRIN 81 MG PO CHEW
CHEWABLE_TABLET | ORAL | Status: AC
  Administered 2017-07-30: 81 mg via ORAL
  Filled 2017-07-30: qty 1

## 2017-07-30 MED ORDER — VERAPAMIL HCL 2.5 MG/ML IV SOLN
INTRAVENOUS | Status: DC | PRN
  Administered 2017-07-30: 14:00:00 via INTRA_ARTERIAL

## 2017-07-30 MED ORDER — FENTANYL CITRATE (PF) 100 MCG/2ML IJ SOLN
INTRAMUSCULAR | Status: AC
Start: 2017-07-30 — End: 2017-07-30
  Filled 2017-07-30: qty 2

## 2017-07-30 MED ORDER — IOPAMIDOL (ISOVUE-370) INJECTION 76%
INTRAVENOUS | Status: DC | PRN
  Administered 2017-07-30: 110 mL

## 2017-07-30 MED ORDER — HEPARIN SODIUM (PORCINE) 1000 UNIT/ML IJ SOLN
INTRAMUSCULAR | Status: DC | PRN
  Administered 2017-07-30: 5000 [IU] via INTRAVENOUS

## 2017-07-30 MED ORDER — SODIUM CHLORIDE 0.9% FLUSH: 3.0000 mL | INTRAVENOUS | Status: DC | PRN

## 2017-07-30 MED ORDER — ASPIRIN 81 MG PO CHEW
81.0000 mg | CHEWABLE_TABLET | ORAL | Status: AC
  Administered 2017-07-30: 81 mg via ORAL

## 2017-07-30 MED ORDER — HEPARIN SODIUM (PORCINE) 1000 UNIT/ML IJ SOLN
INTRAMUSCULAR | Status: AC
  Filled 2017-07-30: qty 1

## 2017-07-30 MED ORDER — HEPARIN (PORCINE) IN NACL 2-0.9 UNIT/ML-% IJ SOLN
INTRAMUSCULAR | Status: DC | PRN
  Administered 2017-07-30: 14:00:00

## 2017-07-30 MED ORDER — HEPARIN (PORCINE) IN NACL 2-0.9 UNIT/ML-% IJ SOLN
INTRAMUSCULAR | Status: AC
  Filled 2017-07-30: qty 500

## 2017-07-30 MED ORDER — SODIUM CHLORIDE 0.9 % WEIGHT BASED INFUSION
3.0000 mL/kg/h | INTRAVENOUS | Status: AC
  Administered 2017-07-30: 3 mL/kg/h via INTRAVENOUS

## 2017-07-30 MED ORDER — IOPAMIDOL (ISOVUE-370) INJECTION 76%
INTRAVENOUS | Status: AC
  Filled 2017-07-30: qty 100

## 2017-07-30 MED ORDER — FENTANYL CITRATE (PF) 100 MCG/2ML IJ SOLN
INTRAMUSCULAR | Status: DC | PRN
  Administered 2017-07-30 (×3): 25 ug via INTRAVENOUS

## 2017-07-30 MED ORDER — SODIUM CHLORIDE 0.9% FLUSH: 3.0000 mL | Freq: Two times a day (BID) | INTRAVENOUS | Status: DC

## 2017-07-30 MED ORDER — SODIUM CHLORIDE 0.9 % IV SOLN: 250.0000 mL | INTRAVENOUS | Status: DC | PRN

## 2017-07-30 SURGICAL SUPPLY — 18 items
CATH 5FR JL3.5 JR4 ANG PIG MP (CATHETERS) ×2 IMPLANT
CATH BALLN WEDGE 5F 110CM (CATHETERS) ×2 IMPLANT
CATH INFINITI 4FR 3 DRC (CATHETERS) ×2 IMPLANT
CATH LAUNCHER 5F RADR (CATHETERS) ×1 IMPLANT
CATHETER LAUNCHER 5F RADR (CATHETERS) ×2
COVER PRB 48X5XTLSCP FOLD TPE (BAG) ×1 IMPLANT
COVER PROBE 5X48 (BAG) ×2
DEVICE RAD COMP TR BAND LRG (VASCULAR PRODUCTS) ×2 IMPLANT
GLIDESHEATH SLEND SS 6F .021 (SHEATH) ×2 IMPLANT
GUIDEWIRE INQWIRE 1.5J.035X260 (WIRE) ×1 IMPLANT
INQWIRE 1.5J .035X260CM (WIRE) ×2
KIT HEART LEFT (KITS) ×2 IMPLANT
PACK CARDIAC CATHETERIZATION (CUSTOM PROCEDURE TRAY) ×2 IMPLANT
SHEATH GLIDE SLENDER 4/5FR (SHEATH) ×2 IMPLANT
TRANSDUCER W/STOPCOCK (MISCELLANEOUS) ×2 IMPLANT
TUBING CIL FLEX 10 FLL-RA (TUBING) ×2 IMPLANT
WIRE EMERALD 3MM-J .025X260CM (WIRE) ×2 IMPLANT
WIRE HI TORQ VERSACORE-J 145CM (WIRE) ×2 IMPLANT

## 2017-07-30 NOTE — H&P (View-Only) (Signed)
Cardiology Office Note:    Date:  07/27/2017   ID:  [name], DOB [birth year], MRN [id number]  PCP:  Rich Fuchs, PA  Cardiologist:  Dr. Dorris Carnes    Referring MD: Rich Fuchs, PA   Chief Complaint  Patient presents with  . Follow-up    Chest pain, dyspnea    History of Present Illness:    [name] is a 61 y.o. female with a hx of diastolic HF, PSVT, prior CVA x 2, HTN, IDDM, COPD, OSA, CSF rhinorrhea (2/2 encephalocele) s/p skull base reconstruction.  She was evaluated in the emergency room twice in July for PSVT. She was converted to NSR with adenosine 06/18/17. She did have elevated troponins consistent with demand ischemia. She was discharged on metoprolol but stopped this secondary to nausea. On 7/16, she had recurrent symptoms and converted to NSR with vagal maneuvers. Of note, she was using her Albuterol inhaler quite often.  She was seen by Dr. Sallyanne Kuster in consultation who placed her on Verapamil.  I saw her in the office earlier this month.  She complained of dyspnea on exertion and exertional chest heaviness.  I placed her on Lasix for volume overload and arranged a Nuclear stress test to rule out ischemia.  The Nuclear stress test was low risk but did show a small inferior perfusion defect that could possibly be ischemia.   [name] returns for Cardiology follow up.  She is here with her sister.  She does not feel any better.  She is still short of breath with minimal activity and is experiencing chest pressure with minimal activity. She is nauseated at times and also diaphoretic.  She denies syncope.  She notes paroxysmal nocturnal dyspnea, edema.  She is limiting her salt. She has been drinking "a lot" of water.  She is still smoking.  She sees Pulmonology next month.    Prior CV studies:   The following studies were reviewed today:  Nuclear stress test 07/22/17 1. EF 57%, normal wall motion.  2. Reversible small, mild basal inferior perfusion  defect.  Possible small area of ischemia.  Low risk study.   Echo 07/03/17 Severe conc LVH, EF 60-65, no RWMA, Gr 2 DD, MAC, mild MR  Carotid US 2/17 Bilateral plaque without hemodynamically significant stenosis  Carotid US 7/14 Bilateral ICA 40-59  Myoview 9/12 EF 44, no ischemia  Past Medical History:  Diagnosis Date  . Anxiety    doesn't take any meds for this  . Arthritis    knuckles  . Asthma   . Carotid artery disease ([place])    Carotid US 7/14: Bilateral ICA 40-59 // Carotid US ([facility]) 2/17: bilat plaque without sig stenosis  . Claustrophobia 07/26/2012  . COPD (chronic obstructive pulmonary disease) ([place])   . Diverticulosis   . Dizziness    related to CSF leakage  . Headache(784.0)    related to CSF leakage  . History of chronic bronchitis 07/26/2012   "just about q winter"  . History of nuclear stress test    Myoview 8/18: EF 57%, normal wall motion. Reversible small, mild basal inferior perfusion defect.  Possible small area of ischemia. Low risk study.   Marland Kitchen HTN (hypertension)    takes Amlodipine daily  . Hx of gout   . Hyperlipidemia    takes Pravastatin daily  . Internal hemorrhoids   . Kidney stones   . LVH (left ventricular hypertrophy) due to hypertensive disease, with heart failure ([place])  Echo 8/18: Severe conc LVH, EF 60-65, no RWMA, Gr 2 DD, MAC, mild MR  . OSA (obstructive sleep apnea) 07/26/2012   denies CPAP  . Stroke [facility]) 03/2004   denies residual 07/26/2012  . Type II diabetes mellitus ([place])     Past Surgical History:  Procedure Laterality Date  . ESOPHAGOGASTRODUODENOSCOPY (EGD) WITH PROPOFOL N/A 07/13/2015   Procedure: ESOPHAGOGASTRODUODENOSCOPY (EGD) WITH PROPOFOL;  Surgeon: Carol Ada, MD;  Location: [facility];  Service: Endoscopy;  Laterality: N/A;  . NASAL SINUS SURGERY  01/29/2012   Procedure: ENDOSCOPIC SINUS SURGERY WITH STEALTH;  Surgeon: Ruby Cola, MD;  Location: [place];  Service: ENT;  Laterality: N/A;  . NM MYOVIEW LTD   08/25/2011   Low risk study, no evidence of ischemia, EF 44%  . REFRACTIVE SURGERY  ~ 2010   left  . SPHENOIDECTOMY  01/29/2012   Procedure: SPHENOIDECTOMY;  Surgeon: Ruby Cola, MD;  Location: [place];  Service: ENT;  Laterality: Left;  REPAIR OF LEFT SPHENOID    . TOTAL ABDOMINAL HYSTERECTOMY  1994    Current Medications: Current Meds  Medication Sig  . acetaminophen (TYLENOL) 325 MG tablet Take 650 mg by mouth every 6 (six) hours as needed for mild pain.  Marland Kitchen albuterol (VENTOLIN HFA) 108 (90 Base) MCG/ACT inhaler Inhale 1 puff into the lungs 3 (three) times daily as needed for wheezing.  Marland Kitchen aspirin EC 81 MG tablet Take 1 tablet (81 mg total) by mouth daily.  . baclofen (LIORESAL) 10 MG tablet Take 10 mg by mouth 2 (two) times daily.   . busPIRone (BUSPAR) 15 MG tablet TAKE 1 TABLET BY MOUTH 3 TIMES A DAY  . CRESTOR 20 MG tablet Take 20 mg by mouth daily.   Marland Kitchen FLUoxetine (PROZAC) 10 MG capsule Take 4 capsules (40 mg total) by mouth at bedtime.  . furosemide (LASIX) 40 MG tablet Take 1 tablet (40 mg total) by mouth daily.  Marland Kitchen gabapentin (NEURONTIN) 400 MG capsule Take 1 capsule (400 mg total) by mouth 3 (three) times daily.  Marland Kitchen glycopyrrolate (ROBINUL) 2 MG tablet Take 1 tablet (2 mg total) by mouth 2 (two) times daily.  . insulin aspart (NOVOLOG FLEXPEN) 100 UNIT/ML FlexPen Inject 30-40 Units into the skin 3 (three) times daily with meals.  . insulin glargine (LANTUS) 100 UNIT/ML injection INJECT 120 UNITS INTO THE SKIN IN THE MORNING AND 100 UNITS INTO THE SKIN AT BEDTIME  . levalbuterol (XOPENEX HFA) 45 MCG/ACT inhaler Inhale 1 puff into the lungs every 4 (four) hours as needed for wheezing.  . methocarbamol (ROBAXIN) 750 MG tablet Take 750 mg by mouth every 6 (six) hours as needed for muscle spasms.  Marland Kitchen omeprazole (PRILOSEC) 40 MG capsule TAKE ONE CAPSULE BY MOUTH EVERY DAY  . potassium chloride SA (KLOR-CON M20) 20 MEQ tablet Take 1 tablet (20 mEq total) by mouth daily.  Marland Kitchen topiramate  (TOPAMAX) 25 MG tablet Take one tablet by mouth twice daily  . Verapamil HCl CR 300 MG CP24 Take 1 capsule (300 mg total) by mouth daily.  . [DISCONTINUED] valsartan (DIOVAN) 80 MG tablet Take 1 tablet (80 mg total) by mouth daily.     Allergies:   Atorvastatin; Ibuprofen; Metformin and related; Ace inhibitors; and Hydrocodone-acetaminophen   Social History   Social History  . Marital status: Single    Spouse name: N/A  . Number of children: 0  . Years of education: N/A   Occupational History  . disabled    Social History Main  Topics  . Smoking status: Current Every Day Smoker    Packs/day: 0.50    Years: 35.00    Types: Cigarettes  . Smokeless tobacco: Never Used     Comment: 07/26/2012 has decreased smoking from 2ppd; offered cessation materials; pt declines  . Alcohol use 0.6 oz/week    1 Glasses of wine per week     Comment: 3 beers monthly  . Drug use: No  . Sexual activity: No   Other Topics Concern  . None   Social History Narrative   Currently unemployed.  Really wants to find a job, but has been difficult for her.  No children, not married     Family Hx: The patient's family history includes Alzheimer's disease in her mother; Breast cancer in her sister; Breast cancer (age of onset: 37) in her mother; Cancer in her sister; Colon cancer (age of onset: 66) in her sister; Diabetes (age of onset: 55) in her sister; Diabetes (age of onset: 33) in her sister; Heart failure in her sister; Lung cancer in her father. There is no history of Anesthesia problems, Hypotension, Malignant hyperthermia, or Pseudochol deficiency.  ROS:   Please see the history of present illness.    Review of Systems  Constitution: Positive for chills and diaphoresis.  Cardiovascular: Positive for dyspnea on exertion, irregular heartbeat and leg swelling.  Respiratory: Positive for cough, shortness of breath, snoring and wheezing.   Musculoskeletal: Positive for back pain, joint swelling and  myalgias.  Gastrointestinal: Positive for diarrhea, nausea and vomiting.  Neurological: Positive for dizziness, headaches and loss of balance.  Psychiatric/Behavioral: Positive for depression. The patient is nervous/anxious.    All other systems reviewed and are negative.   EKGs/Labs/Other Test Reviewed:    EKG:  EKG is  ordered today.  The ekg ordered today demonstrates NSR, HR 80, RAD, septal Q waves, NSSTTW changes, QTc 486 ms  Recent Labs: 06/18/2017: TSH 2.800 06/22/2017: Hemoglobin 15.7; Platelets 176 07/13/2017: NT-Pro BNP 1,585 07/21/2017: BUN 16; Creatinine, Ser 1.31; Potassium 3.7; Sodium 140   Recent Lipid Panel Lab Results  Component Value Date/Time   CHOL 216 (H) 12/23/2013 11:54 AM   TRIG 177 (H) 12/23/2013 11:54 AM   HDL 41 12/23/2013 11:54 AM   CHOLHDL 5.3 12/23/2013 11:54 AM   LDLCALC 140 (H) 12/23/2013 11:54 AM   LDLDIRECT 203 (H) 06/20/2011 02:04 PM    Physical Exam:    VS:  BP (!) 152/78   Pulse 84   Ht _0  (1.575 m)   Wt 252 lb 6.4 oz (114.5 kg)   SpO2 97%   BMI 46.16 kg/m     Wt Readings from Last 3 Encounters:  07/27/17 252 lb 6.4 oz (114.5 kg)  07/21/17 252 lb (114.3 kg)  07/13/17 252 lb (114.3 kg)     Physical Exam  Constitutional: She is oriented to person, place, and time. She appears well-developed and well-nourished. No distress.  HENT:  Head: Normocephalic and atraumatic.  Eyes: No scleral icterus.  Neck: No JVD (I cannot assess JVD) present.  Cardiovascular: Normal rate and regular rhythm.   Murmur heard.  Holosystolic murmur is present with a grade of 2/6  at the lower left sternal border Pulmonary/Chest: She has decreased breath sounds. She has no wheezes. She has no rales.  Abdominal: She exhibits distension.  Musculoskeletal: She exhibits edema (trace-1+ bilat LE edema).  Neurological: She is alert and oriented to person, place, and time.  Skin: Skin is warm and dry.  Psychiatric:  She has a normal mood and affect.     ASSESSMENT:    1. Angina pectoris ([place])   2. Chronic diastolic CHF (congestive heart failure) ([place])   3. Hypertensive heart disease with chronic diastolic congestive heart failure (HCC)   4. Paroxysmal SVT (supraventricular tachycardia) ([place])   5. CKD (chronic kidney disease) stage 3, GFR 30-59 ml/min   6. Chronic obstructive pulmonary disease, unspecified COPD type ([place])    PLAN:    In order of problems listed above:  1. Angina pectoris ([place]) She continues to experience chest pressure that is suspicious for angina. Her recent nuclear stress test was low risk but did demonstrate a minimal area of possible inferior ischemia. She has several risk factors for coronary artery disease including smoking history and diabetes. ECG remains unchanged. She is essentially describing CCS class III angina. She is intol of beta-blockers. I have recommended proceeding with Cardiac Catheterization to further evaluate her symptoms.  I reviewed this with Dr. Dorris Carnes who agreed.  Risks and benefits of cardiac catheterization have been discussed with the patient.  These include bleeding, infection, kidney damage, stroke, heart attack, death.  The patient understands these risks and is willing to proceed.   -  I will add isosorbide 30 mg daily.  -  Rx for prn NTG given  -  Continue ASA, Verapamil, Statin  -  Arrange R/L Heart Cath w/ Angio later this week  -  She knows to go to the ED for any change in her symptoms.   2. Chronic diastolic CHF (congestive heart failure) ([place])  She has not had any weight loss or improvement in edema with Furosemide.  Her exam is difficult but she appears to remain volume overloaded.  Question if her chest pain is related to congestive heart failure vs angina vs pulmonary disease.  As noted, R/L heart cath will be obtained.  Continue current dose of Lasix.  This can be adjusted if her filling pressures are high at cath.   3. Hypertensive heart disease with chronic diastolic  congestive heart failure (HCC) BP above target. She stopped Valsartan recently due to the recall.  We can consider an alternative angiotensin receptor blocker after her Cardiac Catheterization.  Add Isosorbide as noted.  Continue Verapamil.    4. Paroxysmal SVT (supraventricular tachycardia) (HCC) No recurrent symptoms.  Continue calcium channel blocker.  5. CKD  Creatinine 1.2-1.3.  Will hold Lasix AM of cath.  Would avoid LV gram to limit contrast as well.  6. Chronic obstructive pulmonary disease, unspecified COPD type ([place]) She understands that she needs to quit smoking. She sees Pulmonology soon.  As noted, R heart cath will also be performed.    Dispo:  Return in about 2 weeks (around 08/10/2017) for Post Procedure Follow Up, w/ Dr. Harrington Challenger, or Richardson Dopp, PA-C.    Medication Adjustments/Labs and Tests Ordered: Current medicines are reviewed at length with the patient today.  Concerns regarding medicines are outlined above.  Tests Ordered: Orders Placed This Encounter  Procedures  . Basic Metabolic Panel (BMET)  . CBC  . INR/PT  . EKG 12-Lead   Medication Changes: Meds ordered this encounter  Medications  . isosorbide mononitrate (IMDUR) 30 MG 24 hr tablet    Sig: Take 1 tablet (30 mg total) by mouth daily.    Dispense:  90 tablet    Refill:  3  . nitroGLYCERIN (NITROSTAT) 0.4 MG SL tablet    Sig: Place 1 tablet (0.4 mg total) under the  tongue every 5 (five) minutes as needed.    Dispense:  25 tablet    Refill:  3    Signed, Richardson Dopp, PA-C  07/27/2017 12:06 PM    [facility], [place], [place]  [address] Phone: [phone]; Fax: [phone]

## 2017-07-30 NOTE — Progress Notes (Signed)
Brachial Sheath removed from right antecubital area. Pressure held for 10 minutes. 4x4 covered by a large Tegaderm and CoFlex. Area remains a Level 0.

## 2017-07-30 NOTE — Research (Signed)
OPTIMIZE Research Study Informed Consent   Subject Name: [name]  Subject met inclusion and exclusion criteria.  The informed consent form, study requirements and expectations were reviewed with the subject and questions and concerns were addressed prior to the signing of the consent form.  The subject verbalized understanding of the trail requirements.  The subject agreed to participate in the trial and signed the informed consent at 1225 on 07/30/17.  The informed consent was obtained prior to performance of any protocol-specific procedures for the subject.  A copy of the signed informed consent was given to the subject and a copy was placed in the subject's medical record. Enrollment is dependent on subject meeting angiographic eligibility.  [name] 07/30/2017, 12:54 PM

## 2017-07-30 NOTE — Discharge Instructions (Signed)

## 2017-07-30 NOTE — Interval H&P Note (Signed)
History and Physical Interval Note:  07/30/2017 12:50 PM  Barbette Or Lietzke  has presented today for surgery, with the diagnosis of heart failure, chronic diastolic; cp  The various methods of treatment have been discussed with the patient and family. After consideration of risks, benefits and other options for treatment, the patient has consented to  Procedure(s): RIGHT/LEFT HEART CATH AND CORONARY ANGIOGRAPHY (N/A) as a surgical intervention .  The patient's history has been reviewed, patient examined, no change in status, stable for surgery.  I have reviewed the patient's chart and labs.  Questions were answered to the patient's satisfaction.    Cath Lab Visit (complete for each Cath Lab visit)  Clinical Evaluation Leading to the Procedure:   ACS: No.  Non-ACS:    Anginal Classification: CCS II  Anti-ischemic medical therapy: Minimal Therapy (1 class of medications)  Non-Invasive Test Results: Low-risk stress test findings: cardiac mortality <1%/year  Prior CABG: No previous CABG       Collier Salina [facility] 07/30/2017 12:50 PM

## 2017-07-31 ENCOUNTER — Other Ambulatory Visit: Payer: Self-pay | Admitting: Internal Medicine

## 2017-07-31 ENCOUNTER — Encounter (HOSPITAL_COMMUNITY): Payer: Self-pay | Admitting: Cardiology

## 2017-08-03 ENCOUNTER — Ambulatory Visit: Payer: Medicare Other | Admitting: Internal Medicine

## 2017-08-14 ENCOUNTER — Encounter: Payer: Self-pay | Admitting: Physician Assistant

## 2017-08-14 ENCOUNTER — Ambulatory Visit (INDEPENDENT_AMBULATORY_CARE_PROVIDER_SITE_OTHER): Payer: Medicare Other | Admitting: Physician Assistant

## 2017-08-14 VITALS — BP 136/70 | HR 83 | Ht 62.0 in | Wt 251.8 lb

## 2017-08-14 DIAGNOSIS — I5032 Chronic diastolic (congestive) heart failure: Secondary | ICD-10-CM | POA: Diagnosis not present

## 2017-08-14 DIAGNOSIS — I471 Supraventricular tachycardia: Secondary | ICD-10-CM | POA: Diagnosis not present

## 2017-08-14 DIAGNOSIS — I272 Pulmonary hypertension, unspecified: Secondary | ICD-10-CM

## 2017-08-14 DIAGNOSIS — R1011 Right upper quadrant pain: Secondary | ICD-10-CM

## 2017-08-14 DIAGNOSIS — I110 Hypertensive heart disease with heart failure: Secondary | ICD-10-CM

## 2017-08-14 DIAGNOSIS — I2510 Atherosclerotic heart disease of native coronary artery without angina pectoris: Secondary | ICD-10-CM

## 2017-08-14 DIAGNOSIS — J449 Chronic obstructive pulmonary disease, unspecified: Secondary | ICD-10-CM | POA: Diagnosis not present

## 2017-08-14 DIAGNOSIS — G4733 Obstructive sleep apnea (adult) (pediatric): Secondary | ICD-10-CM

## 2017-08-14 HISTORY — DX: Pulmonary hypertension, unspecified: I27.20

## 2017-08-14 MED ORDER — ISOSORBIDE MONONITRATE ER 60 MG PO TB24: 60.0000 mg | ORAL_TABLET | Freq: Every day | ORAL | 3 refills | Status: DC

## 2017-08-14 NOTE — Patient Instructions (Addendum)
Medication Instructions:  Increase Isosorbide (Imdur) to 60 mg Once daily.  You can take 2 of the 30 mg tabs to = 60 mg.  I have sent a new prescription to your pharmacy.  Labwork: None   Testing/Procedures: Your physician has recommended that you have a SPLIT NIGHT sleep study. This test records several body functions during sleep, including: brain activity, eye movement, oxygen and carbon dioxide blood levels, heart rate and rhythm, breathing rate and rhythm, the flow of air through your mouth and nose, snoring, body muscle movements, and chest and belly movement. NINA JONES, CMA FROM OUR OFFICE WILL CALL YOU TO SET UP SLEEP STUDY    Follow-Up: Your physician wants you to follow-up in: [place] DR. ROSS You will receive a reminder letter in the mail two months in advance. If you don't receive a letter, please call our office to schedule the follow-up appointment.   Any Other Special Instructions Will Be Listed Below (If Applicable). Work on diet and weight loss. Keep your appointment with the lung doctor (Dr. Melvyn Novas) Work on quitting smoking.  If you need a refill on your cardiac medications before your next appointment, please call your pharmacy.

## 2017-08-14 NOTE — Progress Notes (Signed)
Cardiology Office Note:    Date:  08/14/2017   ID:  [name], DOB [birth year], MRN [id number]  PCP:  Rich Fuchs, PA  Cardiologist:  Dr. Dorris Carnes    Referring MD: Rich Fuchs, PA   Chief Complaint  Patient presents with  . Follow-up    Status post cardiac catheterization    History of Present Illness:    MIRIANA [name] is a 61 y.o. female with a hx of diastolic HF, PSVT, prior CVA x 2, HTN, IDDM, COPD, OSA, CSF rhinorrhea (2/2 encephalocele) s/p skull base reconstruction.  She was evaluated in the emergency room twice in July for PSVT. She converted to NSR with adenosine 06/18/17. She did have elevated troponins consistent with demand ischemia. Beta blocker therapy was stopped due to nausea and she was placed on calcium channel blocker therapy with verapamil.  In follow-up, she complained of dyspnea and chest discomfort. Stress testing was low risk but did demonstrate a possible small area of ischemia in the inferior wall. Given her ongoing symptoms, she was set up for right and left heart catheterization.  R/L Cardiac catheterization demonstrated minor nonobstructive CAD, mildly elevated LVEDP and moderate pulmonary hypertension.  [name] returns for follow-up after her recent cardiac catheterization. She is overall doing well. She continues to have shortness of breath with exertion. This is unchanged. She notes what sounds like PND. She notes improved chest pain. She thinks the isosorbide helped. She denies syncope or near syncope. She denies significant LE edema. She denies any bleeding. She sees pulmonology later this month. She has continued to decrease her smoking and is down to about 6 cigarettes a day.  Prior CV studies:   The following studies were reviewed today:  R/L Cardiac Catheterization 07/30/17  LM normal LAD proximal 35 LCx okay, OM2 25 RCA normal RVSP 66, PASP 57, mean PA 39, mean PWCP 18, LVEDP 21  Nuclear stress test 07/22/17 1. EF 57%,  normal wall motion.  2. Reversible small, mild basal inferior perfusion defect.  Possible small area of ischemia.  Low risk study.    Echo 07/03/17 Severe conc LVH, EF 60-65, no RWMA, Gr 2 DD, MAC, mild MR   Carotid US 2/17 Bilateral plaque without hemodynamically significant stenosis   Carotid US 7/14 Bilateral ICA 40-59   Myoview 9/12 EF 44, no ischemia   Past Medical History:  Diagnosis Date  . Anxiety    doesn't take any meds for this  . Arteriosclerosis of coronary artery 08/31/2011   Myoview 9/12: EF 44, no ischemia // Myoview 8/18: EF 57, low risk, possible small area of inferior ischemia // Minor nonobstructive CAD by cardiac catheterization - [facility] 8/18: Proximal LAD 35, OM2 25  . Arthritis    knuckles  . Asthma   . Carotid artery disease ([place])    Carotid US 7/14: Bilateral ICA 40-59 // Carotid US ([facility]) 2/17: bilat plaque without sig stenosis  . Chronic diastolic CHF (congestive heart failure) ([place]) 07/26/2017   Echo 7/18:Severe conc LVH, EF 60-65, no RWMA, Gr 2 DD, MAC, mild MR // right and left heart cath 8/18: LVEDP 21  . Claustrophobia 07/26/2012  . COPD (chronic obstructive pulmonary disease) ([place])    "chronic bronchitis - about every winter"  . CSF leak    dizziness/headache - assoc symptoms // 2/2 encephalocele s/p skull base reconstruction  . Diverticulosis   . History of CVA (cerebrovascular accident) 03/2004   denies residual 07/26/2012  . Hx of gout   .  Hyperlipidemia    takes Pravastatin daily  . Hypertensive heart disease with chronic diastolic congestive heart failure ([place]) 07/26/2017  . Internal hemorrhoids   . Kidney stones   . LVH (left ventricular hypertrophy)    Echo 8/18: Severe conc LVH, EF 60-65, no RWMA, Gr 2 DD, MAC, mild MR  . OSA (obstructive sleep apnea) 07/26/2012   prior CPAP - stopped due to being primary caregiver for dying parent (stopped in 2016)  . PSVT (paroxysmal supraventricular tachycardia) ([place])   . Pulmonary hypertension,  unspecified ([place]) 08/14/2017   Right heart cath 8/13:RVSP 66, PASP 57, mean PA 39, mean PWCP 18, LVEDP 21  . Type II diabetes mellitus ([place])     Past Surgical History:  Procedure Laterality Date  . ESOPHAGOGASTRODUODENOSCOPY (EGD) WITH PROPOFOL N/A 07/13/2015   Procedure: ESOPHAGOGASTRODUODENOSCOPY (EGD) WITH PROPOFOL;  Surgeon: Carol Ada, MD;  Location: [facility];  Service: Endoscopy;  Laterality: N/A;  . NASAL SINUS SURGERY  01/29/2012   Procedure: ENDOSCOPIC SINUS SURGERY WITH STEALTH;  Surgeon: Ruby Cola, MD;  Location: [place];  Service: ENT;  Laterality: N/A;  . NM MYOVIEW LTD  08/25/2011   Low risk study, no evidence of ischemia, EF 44%  . REFRACTIVE SURGERY  ~ 2010   left  . RIGHT/LEFT HEART CATH AND CORONARY ANGIOGRAPHY N/A 07/30/2017   Procedure: RIGHT/LEFT HEART CATH AND CORONARY ANGIOGRAPHY;  Surgeon: Martinique, Peter M, MD;  Location: [place] [facility];  Service: Cardiovascular;  Laterality: N/A;  . SPHENOIDECTOMY  01/29/2012   Procedure: SPHENOIDECTOMY;  Surgeon: Ruby Cola, MD;  Location: [facility];  Service: ENT;  Laterality: Left;  REPAIR OF LEFT SPHENOID    . TOTAL ABDOMINAL HYSTERECTOMY  1994    Current Medications: Current Meds  Medication Sig  . acetaminophen (TYLENOL) 325 MG tablet Take 650 mg by mouth every 6 (six) hours as needed for mild pain.  Marland Kitchen aspirin EC 81 MG tablet Take 1 tablet (81 mg total) by mouth daily.  . baclofen (LIORESAL) 10 MG tablet Take 10 mg by mouth 2 (two) times daily.   . busPIRone (BUSPAR) 15 MG tablet TAKE 15 MG BY MOUTH 3 TIMES A DAY  . CRESTOR 20 MG tablet Take 20 mg by mouth daily.   Marland Kitchen FLUoxetine (PROZAC) 10 MG capsule Take 4 capsules (40 mg total) by mouth at bedtime.  . furosemide (LASIX) 40 MG tablet Take 1 tablet (40 mg total) by mouth daily.  Marland Kitchen gabapentin (NEURONTIN) 400 MG capsule Take 1 capsule (400 mg total) by mouth 3 (three) times daily.  Marland Kitchen glycopyrrolate (ROBINUL) 2 MG tablet Take 1 tablet (2 mg total) by mouth 2 (two) times  daily.  . insulin aspart (NOVOLOG FLEXPEN) 100 UNIT/ML FlexPen Inject 30-40 Units into the skin 3 (three) times daily with meals.  . insulin glargine (LANTUS) 100 UNIT/ML injection INJECT 120 UNITS INTO THE SKIN IN THE MORNING AND 100 UNITS INTO THE SKIN AT BEDTIME  . levalbuterol (XOPENEX HFA) 45 MCG/ACT inhaler Inhale 1 puff into the lungs every 4 (four) hours as needed for wheezing.  . methocarbamol (ROBAXIN) 750 MG tablet Take 750 mg by mouth every 6 (six) hours as needed for muscle spasms.  . nitroGLYCERIN (NITROSTAT) 0.4 MG SL tablet Place 1 tablet (0.4 mg total) under the tongue every 5 (five) minutes as needed.  Marland Kitchen omeprazole (PRILOSEC) 40 MG capsule TAKE 40 MG BY MOUTH EVERY DAY  . potassium chloride SA (KLOR-CON M20) 20 MEQ tablet Take 1 tablet (20 mEq total) by  mouth daily.  Marland Kitchen topiramate (TOPAMAX) 25 MG tablet Take 25 mg by mouth twice daily  . Verapamil HCl CR 300 MG CP24 Take 1 capsule (300 mg total) by mouth daily.  . [DISCONTINUED] isosorbide mononitrate (IMDUR) 30 MG 24 hr tablet Take 1 tablet (30 mg total) by mouth daily.     Allergies:   Atorvastatin; Ibuprofen; Metformin and related; Ace inhibitors; Hydrocodone-acetaminophen; and Albuterol   Social History   Social History  . Marital status: Single    Spouse name: N/A  . Number of children: 0  . Years of education: N/A   Occupational History  . disabled    Social History Main Topics  . Smoking status: Current Every Day Smoker    Packs/day: 0.50    Years: 35.00    Types: Cigarettes  . Smokeless tobacco: Never Used     Comment: 07/26/2012 has decreased smoking from 2ppd; offered cessation materials; pt declines  . Alcohol use 0.6 oz/week    1 Glasses of wine per week     Comment: 3 beers monthly  . Drug use: No  . Sexual activity: No   Other Topics Concern  . None   Social History Narrative   Currently unemployed.  Really wants to find a job, but has been difficult for her.  No children, not married      Family Hx: The patient's family history includes Alzheimer's disease in her mother; Breast cancer in her sister; Breast cancer (age of onset: 1) in her mother; Cancer in her sister; Colon cancer (age of onset: 16) in her sister; Diabetes (age of onset: 66) in her sister; Diabetes (age of onset: 27) in her sister; Heart failure in her sister; Lung cancer in her father. There is no history of Anesthesia problems, Hypotension, Malignant hyperthermia, or Pseudochol deficiency.  ROS:   Please see the history of present illness.    Review of Systems  Cardiovascular: Positive for dyspnea on exertion and irregular heartbeat.  Respiratory: Positive for cough, shortness of breath, snoring and wheezing.   Musculoskeletal: Positive for back pain and myalgias.  Gastrointestinal: Positive for diarrhea.  Neurological: Positive for headaches and loss of balance.  Psychiatric/Behavioral: Positive for depression.   All other systems reviewed and are negative.   EKGs/Labs/Other Test Reviewed:    EKG:  EKG is  ordered today.  The ekg ordered today demonstrates NSR, HR 83, rightward axis, T-wave inversions 1, aVL, poor R-wave progression, QTc 41 ms, no significant change when compared to prior tracings  Recent Labs: 06/18/2017: TSH 2.800 07/13/2017: NT-Pro BNP 1,585 07/27/2017: BUN 13; Creatinine, Ser 1.02; Hemoglobin 16.0; Platelets 242; Potassium 3.8; Sodium 142   Recent Lipid Panel Lab Results  Component Value Date/Time   CHOL 216 (H) 12/23/2013 11:54 AM   TRIG 177 (H) 12/23/2013 11:54 AM   HDL 41 12/23/2013 11:54 AM   CHOLHDL 5.3 12/23/2013 11:54 AM   LDLCALC 140 (H) 12/23/2013 11:54 AM   LDLDIRECT 203 (H) 06/20/2011 02:04 PM    Physical Exam:    VS:  BP 136/70   Pulse 83   Ht 5' 2"  (1.575 m)   Wt 251 lb 12.8 oz (114.2 kg)   SpO2 93%   BMI 46.05 kg/m     Wt Readings from Last 3 Encounters:  08/14/17 251 lb 12.8 oz (114.2 kg)  07/30/17 252 lb (114.3 kg)  07/27/17 252 lb 6.4 oz (114.5  kg)     Physical Exam  Constitutional: She is oriented to person, place, and  time. She appears well-developed and well-nourished. No distress.  HENT:  Head: Normocephalic and atraumatic.  Eyes: No scleral icterus.  Neck: JVD: I cannot assess JVD.  Cardiovascular: Normal rate and regular rhythm.   No murmur heard. Pulmonary/Chest: Effort normal. She has no rales.  Abdominal: Soft. There is tenderness in the right upper quadrant.  Musculoskeletal: She exhibits edema (Trace bilateral LE edema). She exhibits no deformity (Right wrist without hematoma).  Neurological: She is alert and oriented to person, place, and time. She has normal reflexes.  Skin: Skin is warm and dry.  Psychiatric: She has a normal mood and affect.    ASSESSMENT:    1. Chronic diastolic CHF (congestive heart failure) ([place])   2. Pulmonary hypertension, unspecified ([place])   3. OSA (obstructive sleep apnea)   4. Arteriosclerosis of coronary artery   5. Hypertensive heart disease with chronic diastolic congestive heart failure ([facility])   6. Paroxysmal SVT (supraventricular tachycardia) ([place])   7. Chronic obstructive pulmonary disease, unspecified COPD type ([place])   8. Right upper quadrant abdominal pain    PLAN:    In order of problems listed above:  1. Chronic diastolic CHF (congestive heart failure) (H[facility]) Overall, volume is stable. LVEDP was only mildly elevated at cardiac catheterization. Continue current therapy.  2. Pulmonary hypertension, unspecified ([place]) Pulmonary hypertension likely related to diastolic heart failure, COPD and untreated sleep apnea. We discussed the importance of diet and weight loss. We also discussed the importance of discontinuing tobacco abuse. She will see pulmonology later this month. I have suggested that we arrange sleep study so that she can resume treatment for sleep apnea.  3. OSA (obstructive sleep apnea)   -  Plan: Split night study  4. Arteriosclerosis of coronary artery   Minor nonobstructive CAD by recent cardiac catheterization. Continue risk factor modification. Continue aspirin, statin. Adjust medications for better blood pressure control. She does note improved symptoms with isosorbide. As she is a diabetic, question if she may have microvascular ischemia.  4. Hypertensive heart disease with chronic diastolic congestive heart failure ([place]) Fair control. I will increase her isosorbide to 60 mg daily.  5. Paroxysmal SVT (supraventricular tachycardia) (H[facility])  No apparent recurrence. Continue verapamil.  6. Chronic obstructive pulmonary disease, unspecified COPD type ([place]) She is working on quitting smoking. Referral to pulmonology is pending later this month.  7. Right upper quadrant abdominal pain Her right upper quadrant was tender on exam today. She does note occasional nausea. I have encouraged her to follow-up with primary care to assess for cholelithiasis.    Dispo:  Return in about 6 months (around 02/11/2018) for Routine Follow Up, w/ Dr. Harrington Challenger, or Richardson Dopp, PA-C.   Medication Adjustments/Labs and Tests Ordered: Current medicines are reviewed at length with the patient today.  Concerns regarding medicines are outlined above.  Tests Ordered: Orders Placed This Encounter  Procedures  . EKG 12-Lead  . Split night study   Medication Changes: Meds ordered this encounter  Medications  . isosorbide mononitrate (IMDUR) 60 MG 24 hr tablet    Sig: Take 1 tablet (60 mg total) by mouth daily.    Dispense:  90 tablet    Refill:  3    Signed, Richardson Dopp, PA-C  08/14/2017 12:27 PM    [facility] [address], [place],   [address] Phone: [phone]; Fax: [phone]

## 2017-08-31 ENCOUNTER — Encounter: Payer: Self-pay | Admitting: Internal Medicine

## 2017-08-31 ENCOUNTER — Ambulatory Visit (INDEPENDENT_AMBULATORY_CARE_PROVIDER_SITE_OTHER): Payer: Medicare Other | Admitting: Internal Medicine

## 2017-08-31 VITALS — BP 134/74 | HR 93 | Ht 62.0 in | Wt 252.4 lb

## 2017-08-31 DIAGNOSIS — R05 Cough: Secondary | ICD-10-CM

## 2017-08-31 DIAGNOSIS — I2510 Atherosclerotic heart disease of native coronary artery without angina pectoris: Secondary | ICD-10-CM | POA: Diagnosis not present

## 2017-08-31 DIAGNOSIS — R058 Other specified cough: Secondary | ICD-10-CM

## 2017-08-31 DIAGNOSIS — Z1211 Encounter for screening for malignant neoplasm of colon: Secondary | ICD-10-CM | POA: Diagnosis not present

## 2017-08-31 DIAGNOSIS — G4733 Obstructive sleep apnea (adult) (pediatric): Secondary | ICD-10-CM | POA: Diagnosis not present

## 2017-08-31 DIAGNOSIS — F17210 Nicotine dependence, cigarettes, uncomplicated: Secondary | ICD-10-CM | POA: Diagnosis not present

## 2017-08-31 DIAGNOSIS — I272 Pulmonary hypertension, unspecified: Secondary | ICD-10-CM

## 2017-08-31 MED ORDER — OMEPRAZOLE 40 MG PO CPDR: DELAYED_RELEASE_CAPSULE | ORAL | 2 refills | Status: DC

## 2017-08-31 NOTE — Progress Notes (Signed)
Subjective:     Patient ID: [name], female   DOB: [birth year],    MRN: [id number]  HPI  58 yowf active smoker prev eval by [facility] for OSA last ov 04/2015  rec Get back on your cpap with the new mask, and let us know if you are not doing well.  Will send an order to your home care company to increase the pressure range on your device. Work on [name] loss F/u Sood > did not do    08/31/2017 1st [facility] office visit/ Tamiah Dysart   Chief Complaint  Patient presents with  . pulmonary consult    COPD   cpap was canceled x 2 years with tendency to sense of immediate orthopnea x years not better on cpap which never improved and no worse since stopped it and then coughing started winter of 2018 day and noct white mucus and doe x 50 ft gradual onset/ progressively worse and no better on xopenex   No obvious day to day or daytime variability or assoc excess/ purulent sputum or mucus plugs or hemoptysis or cp or chest tightness, subjective wheeze or overt sinus or hb symptoms. No unusual exp hx or h/o childhood pna/ asthma or knowledge of premature birth.  Sleeping ok flat without nocturnal  or early am exacerbation  of respiratory  c/o's or need for noct saba. Also denies any obvious fluctuation of symptoms with weather or environmental changes or other aggravating or alleviating factors except as outlined above   Current Allergies, Complete Past Medical History, Past Surgical History, Family History, and Social History were reviewed in [employer] record.  ROS  The following are not active complaints unless bolded sore throat, dysphagia, dental problems, itching, sneezing,  nasal congestion or disharge of excess mucus or purulent secretions, ear ache,   fever, chills, sweats, unintended wt loss or wt gain, classically pleuritic or exertional cp,  orthopnea pnd or leg swelling, presyncope, palpitations, abdominal pain, anorexia, nausea, vomiting, diarrhea  or change  in bowel habits or bladder habits, change in stools or change in urine, dysuria, hematuria,  rash, arthralgias, visual complaints, headache, numbness, weakness or ataxia or problems with walking or coordination,  change in mood/affect or memory.        Current Meds  Medication Sig  . acetaminophen (TYLENOL) 325 MG tablet Take 650 mg by mouth every 6 (six) hours as needed for mild pain.  Marland Kitchen aspirin EC 81 MG tablet Take 1 tablet (81 mg total) by mouth daily.  . baclofen (LIORESAL) 10 MG tablet Take 10 mg by mouth 2 (two) times daily.   . busPIRone (BUSPAR) 15 MG tablet TAKE 15 MG BY MOUTH 3 TIMES A DAY  . CRESTOR 20 MG tablet Take 20 mg by mouth daily.   Marland Kitchen FLUoxetine (PROZAC) 10 MG capsule Take 4 capsules (40 mg total) by mouth at bedtime.  . furosemide (LASIX) 40 MG tablet Take 1 tablet (40 mg total) by mouth daily.  Marland Kitchen gabapentin (NEURONTIN) 400 MG capsule Take 1 capsule (400 mg total) by mouth 3 (three) times daily.  Marland Kitchen glycopyrrolate (ROBINUL) 2 MG tablet Take 1 tablet (2 mg total) by mouth 2 (two) times daily.  . insulin aspart (NOVOLOG FLEXPEN) 100 UNIT/ML FlexPen Inject 30-40 Units into the skin 3 (three) times daily with meals.  . insulin glargine (LANTUS) 100 UNIT/ML injection INJECT 120 UNITS INTO THE SKIN IN THE MORNING AND 100 UNITS INTO THE SKIN AT BEDTIME  . isosorbide mononitrate (IMDUR)  60 MG 24 hr tablet Take 1 tablet (60 mg total) by mouth daily.  Marland Kitchen levalbuterol (XOPENEX HFA) 45 MCG/ACT inhaler Inhale 1 puff into the lungs every 4 (four) hours as needed for wheezing.  . methocarbamol (ROBAXIN) 750 MG tablet Take 750 mg by mouth every 6 (six) hours as needed for muscle spasms.  . nitroGLYCERIN (NITROSTAT) 0.4 MG SL tablet Place 1 tablet (0.4 mg total) under the tongue every 5 (five) minutes as needed.  Marland Kitchen omeprazole (PRILOSEC) 40 MG capsule Take 30- 60 min before your first and last meals of the day  . potassium chloride SA (KLOR-CON M20) 20 MEQ tablet Take 1 tablet (20 mEq total) by  mouth daily.  Marland Kitchen topiramate (TOPAMAX) 25 MG tablet Take 25 mg by mouth twice daily  . Verapamil HCl CR 300 MG CP24 Take 1 capsule (300 mg total) by mouth daily.  Marland Kitchen   omeprazole (PRILOSEC) 40 MG capsule TAKE 40 MG BY MOUTH EVERY DAY         Review of Systems     Objective:   Physical Exam    amb wf with harsh baring  upper airway pattern cough/ chewing mint gum   Wt Readings from Last 3 Encounters:  08/31/17 252 lb 6 oz (114.5 kg)  08/14/17 251 lb 12.8 oz (114.2 kg)  07/30/17 252 lb (114.3 kg)    Vital signs reviewed  - Note on arrival 02 sats  93% on RA      HEENT: nl dentition, turbinates bilaterally, and oropharynx. Nl external ear canals without cough reflex   NECK :  without JVD/Nodes/TM/ nl carotid upstrokes bilaterally   LUNGS: no acc muscle use,  Nl contour chest which is clear to A and P bilaterally with cough on insp maneuvers   CV:  RRR  no s3 or murmur or increase in P2, and no edema   ABD: very obese but soft and nontender with nl inspiratory excursion in the supine position. No bruits or organomegaly appreciated, bowel sounds nl  MS:  Nl gait/ ext warm without deformities, calf tenderness, cyanosis or clubbing No obvious joint restrictions   SKIN: warm and dry without lesions    NEURO:  alert, approp, nl sensorium with  no motor or cerebellar deficits apparent.      I personally reviewed images and agree with radiology impression as follows:  CXR:   06/18/17 1. No acute finding when compared to prior. 2. Mild cardiomegaly. Assessment:

## 2017-08-31 NOTE — Patient Instructions (Signed)
Prilosec 40 mg Take 30- 60 min before your first and last meals of the day   GERD (REFLUX)  is an extremely common cause of respiratory symptoms just like yours , many times with no obvious heartburn at all.    It can be treated with medication, but also with lifestyle changes including elevation of the head of your bed (ideally with 6 inch  bed blocks),  Smoking cessation, avoidance of late meals, excessive alcohol, and avoid fatty foods, chocolate, peppermint, colas, red wine, and acidic juices such as orange juice.  NO MINT OR MENTHOL PRODUCTS SO NO COUGH DROPS   USE SUGARLESS CANDY INSTEAD (Jolley ranchers or Stover's or Life Savers) or even ice chips will also do - the key is to swallow to prevent all throat clearing. NO OIL BASED VITAMINS - use powdered substitutes.    The key is to stop smoking completely before smoking completely stops you!    Please schedule a follow up office visit in 6 weeks, call sooner if needed with pfts on return

## 2017-09-01 ENCOUNTER — Encounter: Payer: Self-pay | Admitting: Internal Medicine

## 2017-09-01 DIAGNOSIS — F17210 Nicotine dependence, cigarettes, uncomplicated: Secondary | ICD-10-CM | POA: Insufficient documentation

## 2017-09-01 NOTE — Assessment & Plan Note (Signed)

## 2017-09-01 NOTE — Assessment & Plan Note (Signed)
NPSG 12/2014:  AHI 96/hr - off cpap 2016 ? why - complicated by [facility] by [place] 07/30/17 - repeat split night study ordered 08/14/17   This would not explain the immediate sob supine that did not improve on cpap and may suggest gerd or effects of obesity in supine position or combination of the two

## 2017-09-01 NOTE — Assessment & Plan Note (Signed)
Upper airway cough syndrome (previously labeled PNDS),  is so named because it's frequently impossible to sort out how much is  CR/sinusitis with freq throat clearing (which can be related to primary GERD)   vs  causing  secondary (" extra esophageal")  GERD from wide swings in gastric pressure that occur with throat clearing, often  promoting self use of mint and menthol lozenges that reduce the lower esophageal sphincter tone and exacerbate the problem further in a cyclical fashion.   These are the same pts (now being labeled as having "irritable larynx syndrome" by some cough centers) who not infrequently have a history of having failed to tolerate ace inhibitors(as is the case here),  dry powder inhalers or biphosphonates or report having atypical/extraesophageal reflux symptoms that don't respond to standard doses of PPI  and are easily confused as having aecopd or asthma flares by even experienced allergists/ pulmonologists (myself included).   Additionally, Of the three most common causes of  Sub-acute or recurrent or chronic cough, only one (GERD)  can actually contribute to/ trigger  the other two (asthma and post nasal drip syndrome)  and perpetuate the cylce of cough.  While not intuitively obvious, many patients with chronic low grade reflux do not cough until there is a primary insult that disturbs the protective epithelial barrier and exposes sensitive nerve endings.   This is typically viral but can be direct physical injury such as with an endotracheal tube.   The point is that once this occurs, it is difficult to eliminate the cycle  using anything but a maximally effective acid suppression regimen at least in the short run, accompanied by an appropriate diet to address non acid GERD and try to avoid cyclical cough  Risk factors for reflux include active smoking/ advancing age, obesity and use of mint products   rec max rx for gerd, stop the mint gum and try to cut down/ quit the smoking   and return in 6 weeks for pfts    Total time devoted to counseling  > 50 % of initial 60 min office visit:  review case with pt/ discussion of options/alternatives/ personally creating written customized instructions  in presence of pt  then going over those specific  Instructions directly with the pt including how to use all of the meds but in particular covering each new medication in detail and the difference between the maintenance= "automatic" meds and the prns using an action plan format for the latter (If this problem/symptom => do that organization reading Left to right).  Please see AVS from this visit for a full list of these instructions which I personally wrote for this pt and  are unique to this visit.

## 2017-09-01 NOTE — Assessment & Plan Note (Signed)
Right heart cath 07/30/17 :RVSP 66, PASP 57, mean PA 39, mean PWCP 18, LVEDP 21 with CO 3.68 so PVR = 456   Most likely related to diastolic dysfunction from obesity and osa from same problem > split night study ordered

## 2017-09-01 NOTE — Assessment & Plan Note (Signed)
Body mass index is 46.16 kg/m.  -   Lab Results  Component Value Date   TSH 2.800 51/83/4373     Complicated hyperlipidemia and likely contributing to gerd risk/ doe/reviewed the need and the process to achieve and maintain neg calorie balance > defer f/u primary care including intermittently monitoring thyroid status

## 2017-09-02 DIAGNOSIS — N182 Chronic kidney disease, stage 2 (mild): Secondary | ICD-10-CM | POA: Diagnosis not present

## 2017-09-02 DIAGNOSIS — Z720 Tobacco use: Secondary | ICD-10-CM | POA: Diagnosis not present

## 2017-09-02 DIAGNOSIS — M109 Gout, unspecified: Secondary | ICD-10-CM | POA: Diagnosis not present

## 2017-09-02 DIAGNOSIS — I129 Hypertensive chronic kidney disease with stage 1 through stage 4 chronic kidney disease, or unspecified chronic kidney disease: Secondary | ICD-10-CM | POA: Diagnosis not present

## 2017-09-02 DIAGNOSIS — E1129 Type 2 diabetes mellitus with other diabetic kidney complication: Secondary | ICD-10-CM | POA: Diagnosis not present

## 2017-09-02 DIAGNOSIS — Z6841 Body Mass Index (BMI) 40.0 and over, adult: Secondary | ICD-10-CM | POA: Diagnosis not present

## 2017-09-07 ENCOUNTER — Ambulatory Visit: Payer: Medicare Other | Admitting: Internal Medicine

## 2017-09-14 ENCOUNTER — Ambulatory Visit (INDEPENDENT_AMBULATORY_CARE_PROVIDER_SITE_OTHER): Payer: Medicare Other | Admitting: Podiatry

## 2017-09-14 ENCOUNTER — Encounter: Payer: Self-pay | Admitting: Podiatry

## 2017-09-14 DIAGNOSIS — G629 Polyneuropathy, unspecified: Secondary | ICD-10-CM

## 2017-09-14 DIAGNOSIS — I10 Essential (primary) hypertension: Secondary | ICD-10-CM | POA: Diagnosis not present

## 2017-09-14 DIAGNOSIS — M79676 Pain in unspecified toe(s): Secondary | ICD-10-CM

## 2017-09-14 DIAGNOSIS — I2510 Atherosclerotic heart disease of native coronary artery without angina pectoris: Secondary | ICD-10-CM

## 2017-09-14 DIAGNOSIS — Z0000 Encounter for general adult medical examination without abnormal findings: Secondary | ICD-10-CM | POA: Diagnosis not present

## 2017-09-14 DIAGNOSIS — B351 Tinea unguium: Secondary | ICD-10-CM

## 2017-09-14 DIAGNOSIS — Z23 Encounter for immunization: Secondary | ICD-10-CM | POA: Diagnosis not present

## 2017-09-14 DIAGNOSIS — Z9181 History of falling: Secondary | ICD-10-CM | POA: Insufficient documentation

## 2017-09-14 DIAGNOSIS — Z794 Long term (current) use of insulin: Secondary | ICD-10-CM | POA: Diagnosis not present

## 2017-09-14 DIAGNOSIS — E782 Mixed hyperlipidemia: Secondary | ICD-10-CM | POA: Diagnosis not present

## 2017-09-14 DIAGNOSIS — E1142 Type 2 diabetes mellitus with diabetic polyneuropathy: Secondary | ICD-10-CM | POA: Diagnosis not present

## 2017-09-14 MED ORDER — PREGABALIN 75 MG PO CAPS: 75.0000 mg | ORAL_CAPSULE | Freq: Two times a day (BID) | ORAL | 0 refills | Status: DC

## 2017-09-14 MED ORDER — PREGABALIN 75 MG PO CAPS: 75.0000 mg | ORAL_CAPSULE | Freq: Two times a day (BID) | ORAL | 2 refills | Status: DC

## 2017-09-14 NOTE — Patient Instructions (Signed)
Pregabalin capsules What is this medicine? PREGABALIN (pre GAB a lin) is used to treat nerve pain from diabetes, shingles, spinal cord injury, and fibromyalgia. It is also used to control seizures in epilepsy. This medicine may be used for other purposes; ask your health care provider or pharmacist if you have questions. COMMON BRAND NAME(S): Lyrica What should I tell my health care provider before I take this medicine? They need to know if you have any of these conditions: -bleeding problems -heart disease, including heart failure -history of alcohol or drug abuse -kidney disease -suicidal thoughts, plans, or attempt; a previous suicide attempt by you or a family member -an unusual or allergic reaction to pregabalin, gabapentin, other medicines, foods, dyes, or preservatives -pregnant or trying to get pregnant or trying to conceive with your partner -breast-feeding How should I use this medicine? Take this medicine by mouth with a glass of water. Follow the directions on the prescription label. You can take this medicine with or without food. Take your doses at regular intervals. Do not take your medicine more often than directed. Do not stop taking except on your doctor's advice. A special MedGuide will be given to you by the pharmacist with each prescription and refill. Be sure to read this information carefully each time. Talk to your pediatrician regarding the use of this medicine in children. Special care may be needed. Overdosage: If you think you have taken too much of this medicine contact a poison control center or emergency room at once. NOTE: This medicine is only for you. Do not share this medicine with others. What if I miss a dose? If you miss a dose, take it as soon as you can. If it is almost time for your next dose, take only that dose. Do not take double or extra doses. What may interact with this medicine? -alcohol -certain medicines for blood pressure like captopril,  enalapril, or lisinopril -certain medicines for diabetes, like pioglitazone or rosiglitazone -certain medicines for anxiety or sleep -narcotic medicines for pain This list may not describe all possible interactions. Give your health care provider a list of all the medicines, herbs, non-prescription drugs, or dietary supplements you use. Also tell them if you smoke, drink alcohol, or use illegal drugs. Some items may interact with your medicine. What should I watch for while using this medicine? Tell your doctor or healthcare professional if your symptoms do not start to get better or if they get worse. Visit your doctor or health care professional for regular checks on your progress. Do not stop taking except on your doctor's advice. You may develop a severe reaction. Your doctor will tell you how much medicine to take. Wear a medical identification bracelet or chain if you are taking this medicine for seizures, and carry a card that describes your disease and details of your medicine and dosage times. You may get drowsy or dizzy. Do not drive, use machinery, or do anything that needs mental alertness until you know how this medicine affects you. Do not stand or sit up quickly, especially if you are an older patient. This reduces the risk of dizzy or fainting spells. Alcohol may interfere with the effect of this medicine. Avoid alcoholic drinks. If you have a heart condition, like congestive heart failure, and notice that you are retaining water and have swelling in your hands or feet, contact your health care provider immediately. The use of this medicine may increase the chance of suicidal thoughts or actions. Pay special attention   to how you are responding while on this medicine. Any worsening of mood, or thoughts of suicide or dying should be reported to your health care professional right away. This medicine has caused reduced sperm counts in some men. This may interfere with the ability to father a  child. You should talk to your doctor or health care professional if you are concerned about your fertility. Women who become pregnant while using this medicine for seizures may enroll in the [place] Pregnancy Registry by calling [phone]. This registry collects information about the safety of antiepileptic drug use during pregnancy. What side effects may I notice from receiving this medicine? Side effects that you should report to your doctor or health care professional as soon as possible: -allergic reactions like skin rash, itching or hives, swelling of the face, lips, or tongue -breathing problems -changes in vision -chest pain -confusion -jerking or unusual movements of any part of your body -loss of memory -muscle pain, tenderness, or weakness -suicidal thoughts or other mood changes -swelling of the ankles, feet, hands -unusual bruising or bleeding Side effects that usually do not require medical attention (report to your doctor or health care professional if they continue or are bothersome): -dizziness -drowsiness -dry mouth -headache -nausea -tremors -trouble sleeping -weight gain This list may not describe all possible side effects. Call your doctor for medical advice about side effects. You may report side effects to FDA at 1-800-FDA-1088. Where should I keep my medicine? Keep out of the reach of children. This medicine can be abused. Keep your medicine in a safe place to protect it from theft. Do not share this medicine with anyone. Selling or giving away this medicine is dangerous and against the law. This medicine may cause accidental overdose and death if it taken by other adults, children, or pets. Mix any unused medicine with a substance like cat litter or coffee grounds. Then throw the medicine away in a sealed container like a sealed bag or a coffee can with a lid. Do not use the medicine after the expiration date. Store at room  temperature between 15 and 30 degrees C (59 and 86 degrees F). NOTE: This sheet is a summary. It may not cover all possible information. If you have questions about this medicine, talk to your doctor, pharmacist, or health care provider.  2018 Elsevier/Gold Standard (2015-12-27 10:26:12)

## 2017-09-17 NOTE — Progress Notes (Signed)
Subjective: Ms. Drewry returns the office today for painful, elongated, thickened toenails which she cannot trim herself. Denies any redness or drainage around the nails. She is partial having a last appointment with me. She also states that the gabapentin is not been helping she's having no pain to her feet. She is asking for possible Lyrica her other medications. Denies any acute changes since last appointment and no new complaints today. Denies any systemic complaints such as fevers, chills, nausea, vomiting.   Objective: AAO 3, NAD DP/PT pulses palpable, CRT less than 3 seconds Sensation decreased with SWMF. She is having burning pain to both of her feet and she has been taking gabapentin without any improvement. She has increased the dose on her without any improvement as well. Nails hypertrophic, dystrophic, elongated, brittle, discolored 9. There is tenderness overlying the nails 1-5 bilaterally except the left hallux nail which has previously been removed. There is no surrounding erythema or drainage along the nail sites. Small pieces of nail and the corners of the left hallux toenail does cause pain at time at times. No open lesions or pre-ulcerative lesions are identified. No other areas of tenderness bilateral lower extremities. No overlying edema, erythema, increased warmth. No pain with calf compression, swelling, warmth, erythema.  Assessment: Patient presents with symptomatic onychomycosis; neuropathy  Plan: -Treatment options including alternatives, risks, complications were discussed -Nails sharply debrided 9 without complication/bleeding. -Discussed changing from gabapentin to Lyrica. Discussed with her the side effects of change in medications. We will transition to Lyrica over the next week I discussed with her how to do this. I gave her 2 prescriptions. One was for a seven-day supply of lyrica for a trial with a coupon and then a regular prescription as well. If the trial  does not help she will call the office or if she has any issues with the medication.  -Discussed daily foot inspection. If there are any changes, to call the office immediately.  -Follow-up in 3 months or sooner if any problems are to arise. In the meantime, encouraged to call the office with any questions, concerns, changes symptoms.  Celesta Gentile, DPM

## 2017-09-23 DIAGNOSIS — N182 Chronic kidney disease, stage 2 (mild): Secondary | ICD-10-CM | POA: Diagnosis not present

## 2017-09-23 DIAGNOSIS — E1129 Type 2 diabetes mellitus with other diabetic kidney complication: Secondary | ICD-10-CM | POA: Diagnosis not present

## 2017-10-09 ENCOUNTER — Other Ambulatory Visit: Payer: Self-pay | Admitting: Internal Medicine

## 2017-10-09 DIAGNOSIS — R0600 Dyspnea, unspecified: Secondary | ICD-10-CM

## 2017-10-12 ENCOUNTER — Ambulatory Visit: Payer: Medicare Other | Admitting: Internal Medicine

## 2017-11-23 ENCOUNTER — Other Ambulatory Visit: Payer: Self-pay | Admitting: Internal Medicine

## 2017-11-23 DIAGNOSIS — Z1211 Encounter for screening for malignant neoplasm of colon: Secondary | ICD-10-CM

## 2017-12-15 ENCOUNTER — Ambulatory Visit: Payer: Medicare Other | Admitting: Podiatry

## 2017-12-16 ENCOUNTER — Other Ambulatory Visit: Payer: Self-pay | Admitting: Internal Medicine

## 2017-12-17 NOTE — Telephone Encounter (Signed)
Patient has an appointment to see Dr. Cruzita Lederer on 02/18/18 but is worried about running out of insulin-she only has 2 days worth left. Patient requests script for Lantus be sent to [facility] on [address]

## 2017-12-17 NOTE — Telephone Encounter (Signed)
LM that appt is needed for a refill

## 2018-01-13 ENCOUNTER — Ambulatory Visit (INDEPENDENT_AMBULATORY_CARE_PROVIDER_SITE_OTHER): Payer: Medicare Other | Admitting: Internal Medicine

## 2018-01-13 ENCOUNTER — Encounter: Payer: Self-pay | Admitting: Internal Medicine

## 2018-01-13 ENCOUNTER — Other Ambulatory Visit: Payer: Self-pay | Admitting: Internal Medicine

## 2018-01-13 VITALS — BP 126/86 | HR 89 | Ht 62.0 in | Wt 248.0 lb

## 2018-01-13 DIAGNOSIS — F17210 Nicotine dependence, cigarettes, uncomplicated: Secondary | ICD-10-CM | POA: Diagnosis not present

## 2018-01-13 DIAGNOSIS — J449 Chronic obstructive pulmonary disease, unspecified: Secondary | ICD-10-CM

## 2018-01-13 DIAGNOSIS — R0600 Dyspnea, unspecified: Secondary | ICD-10-CM | POA: Diagnosis not present

## 2018-01-13 DIAGNOSIS — Z1211 Encounter for screening for malignant neoplasm of colon: Secondary | ICD-10-CM | POA: Diagnosis not present

## 2018-01-13 DIAGNOSIS — R05 Cough: Secondary | ICD-10-CM | POA: Diagnosis not present

## 2018-01-13 DIAGNOSIS — R058 Other specified cough: Secondary | ICD-10-CM

## 2018-01-13 LAB — PULMONARY FUNCTION TEST
DL/VA % pred: 104 %
DL/VA: 4.76 ml/min/mmHg/L
DLCO unc % pred: 61 %
DLCO unc: 13.23 ml/min/mmHg
FEF 25-75 Post: 1.66 L/sec
FEF 25-75 Pre: 1.05 L/sec
FEF2575-%Change-Post: 57 %
FEF2575-%Pred-Post: 75 %
FEF2575-%Pred-Pre: 47 %
FEV1-%Change-Post: 6 %
FEV1-%Pred-Post: 57 %
FEV1-%Pred-Pre: 54 %
FEV1-Post: 1.36 L
FEV1-Pre: 1.27 L
FEV1FVC-%Change-Post: 11 %
FEV1FVC-%Pred-Pre: 99 %
FEV6-%Change-Post: -5 %
FEV6-%Pred-Post: 53 %
FEV6-%Pred-Pre: 55 %
FEV6-Post: 1.55 L
FEV6-Pre: 1.64 L
FEV6FVC-%Pred-Post: 104 %
FEV6FVC-%Pred-Pre: 104 %
FVC-%Change-Post: -4 %
FVC-%Pred-Post: 51 %
FVC-%Pred-Pre: 53 %
FVC-Post: 1.57 L
FVC-Pre: 1.64 L
Post FEV1/FVC ratio: 87 %
Post FEV6/FVC ratio: 100 %
Pre FEV1/FVC ratio: 78 %
Pre FEV6/FVC Ratio: 100 %
RV % pred: 53 %
RV: 1.01 L
TLC % pred: 61 %
TLC: 2.9 L

## 2018-01-13 MED ORDER — AMOXICILLIN-POT CLAVULANATE 875-125 MG PO TABS: 1.0000 | ORAL_TABLET | Freq: Two times a day (BID) | ORAL | 0 refills | Status: AC

## 2018-01-13 MED ORDER — OMEPRAZOLE 40 MG PO CPDR: DELAYED_RELEASE_CAPSULE | ORAL | 2 refills | Status: DC

## 2018-01-13 MED ORDER — MOMETASONE FURO-FORMOTEROL FUM 100-5 MCG/ACT IN AERO: INHALATION_SPRAY | RESPIRATORY_TRACT | 11 refills | Status: DC

## 2018-01-13 MED ORDER — MOMETASONE FURO-FORMOTEROL FUM 100-5 MCG/ACT IN AERO: 2.0000 | INHALATION_SPRAY | Freq: Two times a day (BID) | RESPIRATORY_TRACT | 0 refills | Status: DC

## 2018-01-13 NOTE — Progress Notes (Signed)
PFT done today. 

## 2018-01-13 NOTE — Progress Notes (Signed)
Subjective:     Patient ID: [name], female   DOB: 07/26/1956,    MRN: [id number]    Brief patient profile:  61 yowf active smoker prev eval by [facility] for OSA last ov 04/2015  rec Get back on your cpap with the new mask, and let us know if you are not doing well.  Will send an order to your home care company to increase the pressure range on your device. Work on [employer] loss F/u Sood > did not do     History of Present Illness  08/31/2017 1st [facility] office visit/ [name]   Chief Complaint  Patient presents with  . pulmonary consult    COPD  Cpap was canceled x 2 years with tendency to sense of immediate orthopnea x years not better on cpap which never improved and no worse since stopped it and then coughing started winter of 2018 day and noct white mucus and doe x 50 ft gradual onset/ progressively worse and no better on xopenex  rec Prilosec 40 mg Take 30- 60 min before your first and last meals of the day  GERD diet  The key is to stop smoking completely before smoking completely stops you!        01/13/2018  f/u ov/[name] re:  Sob and cough  With restrictive changes on pfts 01/13/2018  Chief Complaint  Patient presents with  . Follow-up    PFT's done today. Increased cough x 3 months- prod with clear to green sputum.  She does not have a rescue inhaler.   Dyspnea:  MMRC2 = can't walk a nl pace on a flat grade s sob but does fine slow and flat HT / [employer] ok / uses [facility] parking  Due to back and breathing  Cough: worse x 3 months  Sleep: 45 degrees feels fine but sister says she coughs all noct  No obvious day to day or daytime variability or assoc   mucus plugs or hemoptysis or cp or chest tightness, subjective wheeze or overt sinus or hb symptoms. No unusual exposure hx or h/o childhood pna/ asthma or knowledge of premature birth.   . Also denies any obvious fluctuation of symptoms with weather or environmental changes or other aggravating or alleviating factors  except as outlined above   Current Allergies, Complete Past Medical History, Past Surgical History, Family History, and Social History were reviewed in [employer] record.  ROS  The following are not active complaints unless bolded Hoarseness, sore throat, dysphagia, dental problems, itching, sneezing,  nasal congestion or discharge of excess mucus or purulent secretions, ear ache,   fever, chills, sweats, unintended wt loss or wt gain, classically pleuritic or exertional cp,  orthopnea pnd or leg swelling, presyncope, palpitations, abdominal pain, anorexia, nausea, vomiting, diarrhea  or change in bowel habits or change in bladder habits, change in stools or change in urine, dysuria, hematuria,  rash, arthralgias, visual complaints, headache, numbness, weakness or ataxia or problems with walking or coordination,  change in mood/affect or memory.         Medications:  Not able to verify     .         Objective:   Physical Exam  Obese wf loud speaking    01/13/2018        248   08/31/17 252 lb 6 oz (114.5 kg)  08/14/17 251 lb 12.8 oz (114.2 kg)  07/30/17 252 lb (114.3 kg)    Vital signs  reviewed - Note on arrival 02 sats  93% on RA     HEENT: nl dentition, turbinates bilaterally, and oropharynx. Nl external ear canals without cough reflex   NECK :  without JVD/Nodes/TM/ nl carotid upstrokes bilaterally   LUNGS: no acc muscle use,  Nl contour chest which is clear to A and P bilaterally without cough on insp or exp maneuvers   CV:  RRR  no s3 or murmur or increase in P2, and no edema   ABD:  soft and nontender with nl inspiratory excursion in the supine position. No bruits or organomegaly appreciated, bowel sounds nl  MS:  Nl gait/ ext warm without deformities, calf tenderness, cyanosis or clubbing No obvious joint restrictions   SKIN: warm and dry without lesions    NEURO:  alert, approp, nl sensorium with  no motor or cerebellar deficits apparent.                    Assessment:

## 2018-01-13 NOTE — Patient Instructions (Addendum)
Omeprazole 40 mg Take 30- 60 min before your first and last meals of the day   Dulera 100 Take 2 puffs first thing in am and then another 2 puffs about 12 hours later.  - if not 100% sure it's helping, try off it after the 2 weeks is up  - if all better, fill the prescription     Work on inhaler technique:  relax and gently blow all the way out then take a nice smooth deep breath back in, triggering the inhaler at same time you start breathing in.  Hold for up to 5 seconds if you can. Blow out thru nose. Rinse and gargle with water when done   Augmentin 875 mg take one pill twice daily  X 10 days - take at breakfast and supper with large glass of water.  It would help reduce the usual side effects (diarrhea and yeast infections) if you ate cultured yogurt at lunch.    GERD (REFLUX)  is an extremely common cause of respiratory symptoms just like yours , many times with no obvious heartburn at all.    It can be treated with medication, but also with lifestyle changes including elevation of the head of your bed (ideally with 6 inch  bed blocks),  Smoking cessation, avoidance of late meals, excessive alcohol, and avoid fatty foods, chocolate, peppermint, colas, red wine, and acidic juices such as orange juice.  NO MINT OR MENTHOL PRODUCTS SO NO COUGH DROPS   USE SUGARLESS CANDY INSTEAD (Jolley ranchers or Stover's or Life Savers) or even ice chips will also do - the key is to swallow to prevent all throat clearing. NO OIL BASED VITAMINS - use powdered substitutes.  The key is to stop smoking completely before smoking completely stops you!   Please schedule a follow up office visit in 4 weeks, sooner if needed  - needs cxr on return

## 2018-01-14 ENCOUNTER — Encounter: Payer: Self-pay | Admitting: Internal Medicine

## 2018-01-14 NOTE — Assessment & Plan Note (Signed)

## 2018-01-14 NOTE — Assessment & Plan Note (Signed)
Body mass index is 45.36 kg/m.  -  trending down slightly  Lab Results  Component Value Date   TSH 2.800 06/18/2017     Contributing to gerd risk/ doe/reviewed the need and the process to achieve and maintain neg calorie balance > defer f/u primary care including intermittently monitoring thyroid status

## 2018-01-14 NOTE — Assessment & Plan Note (Signed)
Max rx for GERD 08/31/2017 > did not do as of 01/13/2018  - retry max rx for gerd 01/13/2018 plus augmentin x 10 days and ct sinus if not improved Max rx for GERD

## 2018-01-14 NOTE — Assessment & Plan Note (Signed)
PFT's  01/13/2018  FEV1 1.36 (57 % ) ratio 87  p 6 % improvement from saba p nothing prior to study with DLCO  61 % corrects to 104  % for alv volume  - 01/13/2018  After extensive coaching inhaler device  effectiveness =    75% try dulera 100 2bid    Despite continued smoking she only has restrictive changes on pfts likely related to obesity but ok to try to rx airways for AB with low dose dulera then regroup in 4 weeks   I had an extended discussion with the patient reviewing all relevant studies completed to date and  lasting 15 to 20 minutes of a 25 minute visit    Each maintenance medication was reviewed in detail including most importantly the difference between maintenance and prns and under what circumstances the prns are to be triggered using an action plan format that is not reflected in the computer generated alphabetically organized AVS.    Please see AVS for specific instructions unique to this visit that I personally wrote and verbalized to the the pt in detail and then reviewed with pt  by my nurse highlighting any  changes in therapy recommended at today's visit to their plan of care.

## 2018-01-18 ENCOUNTER — Ambulatory Visit: Payer: Medicare Other | Admitting: Podiatry

## 2018-02-05 ENCOUNTER — Other Ambulatory Visit: Payer: Self-pay | Admitting: Internal Medicine

## 2018-02-12 ENCOUNTER — Ambulatory Visit: Payer: Medicare Other | Admitting: Internal Medicine

## 2018-02-17 ENCOUNTER — Ambulatory Visit: Payer: Medicare Other | Admitting: Internal Medicine

## 2018-02-18 ENCOUNTER — Ambulatory Visit: Payer: Medicare Other | Admitting: Internal Medicine

## 2018-02-18 ENCOUNTER — Encounter: Payer: Self-pay | Admitting: Internal Medicine

## 2018-02-22 ENCOUNTER — Telehealth: Payer: Self-pay | Admitting: Internal Medicine

## 2018-02-22 NOTE — Telephone Encounter (Addendum)
Patient dismissed from [facility] by Layla Maw MD , effective February 18, 2018. Dismissal letter sent out by certified / registered mail.  daj

## 2018-03-01 ENCOUNTER — Encounter (HOSPITAL_COMMUNITY): Payer: Self-pay | Admitting: Emergency Medicine

## 2018-03-01 ENCOUNTER — Emergency Department (HOSPITAL_COMMUNITY)
Admission: EM | Admit: 2018-03-01 | Discharge: 2018-03-01 | Disposition: A | Discharge status: Home / Self Care | Payer: Medicare Other | Attending: Emergency Medicine | Admitting: Emergency Medicine

## 2018-03-01 ENCOUNTER — Emergency Department (HOSPITAL_COMMUNITY): Payer: Medicare Other

## 2018-03-01 ENCOUNTER — Ambulatory Visit (INDEPENDENT_AMBULATORY_CARE_PROVIDER_SITE_OTHER): Payer: Medicare Other | Admitting: Podiatry

## 2018-03-01 DIAGNOSIS — Z79899 Other long term (current) drug therapy: Secondary | ICD-10-CM | POA: Diagnosis not present

## 2018-03-01 DIAGNOSIS — B351 Tinea unguium: Secondary | ICD-10-CM | POA: Diagnosis not present

## 2018-03-01 DIAGNOSIS — Z8673 Personal history of transient ischemic attack (TIA), and cerebral infarction without residual deficits: Secondary | ICD-10-CM | POA: Insufficient documentation

## 2018-03-01 DIAGNOSIS — I5032 Chronic diastolic (congestive) heart failure: Secondary | ICD-10-CM | POA: Insufficient documentation

## 2018-03-01 DIAGNOSIS — Z794 Long term (current) use of insulin: Secondary | ICD-10-CM | POA: Diagnosis not present

## 2018-03-01 DIAGNOSIS — E785 Hyperlipidemia, unspecified: Secondary | ICD-10-CM | POA: Diagnosis not present

## 2018-03-01 DIAGNOSIS — I110 Hypertensive heart disease with heart failure: Secondary | ICD-10-CM | POA: Insufficient documentation

## 2018-03-01 DIAGNOSIS — F17210 Nicotine dependence, cigarettes, uncomplicated: Secondary | ICD-10-CM | POA: Diagnosis not present

## 2018-03-01 DIAGNOSIS — M79676 Pain in unspecified toe(s): Secondary | ICD-10-CM | POA: Diagnosis not present

## 2018-03-01 DIAGNOSIS — E119 Type 2 diabetes mellitus without complications: Secondary | ICD-10-CM | POA: Insufficient documentation

## 2018-03-01 DIAGNOSIS — R05 Cough: Secondary | ICD-10-CM | POA: Diagnosis not present

## 2018-03-01 DIAGNOSIS — Z7982 Long term (current) use of aspirin: Secondary | ICD-10-CM | POA: Insufficient documentation

## 2018-03-01 DIAGNOSIS — R079 Chest pain, unspecified: Secondary | ICD-10-CM | POA: Diagnosis not present

## 2018-03-01 DIAGNOSIS — I471 Supraventricular tachycardia: Secondary | ICD-10-CM | POA: Diagnosis not present

## 2018-03-01 DIAGNOSIS — J449 Chronic obstructive pulmonary disease, unspecified: Secondary | ICD-10-CM | POA: Diagnosis not present

## 2018-03-01 DIAGNOSIS — E1140 Type 2 diabetes mellitus with diabetic neuropathy, unspecified: Secondary | ICD-10-CM

## 2018-03-01 DIAGNOSIS — I2510 Atherosclerotic heart disease of native coronary artery without angina pectoris: Secondary | ICD-10-CM | POA: Insufficient documentation

## 2018-03-01 DIAGNOSIS — R002 Palpitations: Secondary | ICD-10-CM | POA: Diagnosis present

## 2018-03-01 LAB — CBC
HCT: 46.8 % — ABNORMAL HIGH (ref 36.0–46.0)
Hemoglobin: 15.6 g/dL — ABNORMAL HIGH (ref 12.0–15.0)
MCH: 30.1 pg (ref 26.0–34.0)
MCHC: 33.3 g/dL (ref 30.0–36.0)
MCV: 90.2 fL (ref 78.0–100.0)
Platelets: 183 10*3/uL (ref 150–400)
RBC: 5.19 MIL/uL — ABNORMAL HIGH (ref 3.87–5.11)
RDW: 14.6 % (ref 11.5–15.5)
WBC: 9.3 10*3/uL (ref 4.0–10.5)

## 2018-03-01 LAB — BASIC METABOLIC PANEL
Anion gap: 15 (ref 5–15)
BUN: 15 mg/dL (ref 6–20)
CO2: 24 mmol/L (ref 22–32)
Calcium: 8.1 mg/dL — ABNORMAL LOW (ref 8.9–10.3)
Chloride: 96 mmol/L — ABNORMAL LOW (ref 101–111)
Creatinine, Ser: 1.28 mg/dL — ABNORMAL HIGH (ref 0.44–1.00)
GFR calc Af Amer: 52 mL/min — ABNORMAL LOW (ref >60)
GFR calc non Af Amer: 45 mL/min — ABNORMAL LOW (ref >60)
Glucose, Bld: 349 mg/dL — ABNORMAL HIGH (ref 65–99)
Potassium: 3.4 mmol/L — ABNORMAL LOW (ref 3.5–5.1)
Sodium: 135 mmol/L (ref 135–145)

## 2018-03-01 LAB — I-STAT BETA HCG BLOOD, ED (MC, WL, AP ONLY): I-stat hCG, quantitative: <5 m[IU]/mL (ref <5)

## 2018-03-01 LAB — MAGNESIUM: Magnesium: 1.7 mg/dL (ref 1.7–2.4)

## 2018-03-01 LAB — I-STAT TROPONIN, ED: Troponin i, poc: 0.02 ng/mL (ref 0.00–0.08)

## 2018-03-01 MED ORDER — LACTATED RINGERS IV BOLUS: 1000.0000 mL | Freq: Once | INTRAVENOUS | Status: DC

## 2018-03-01 MED ORDER — POTASSIUM CHLORIDE CRYS ER 20 MEQ PO TBCR
40.0000 meq | EXTENDED_RELEASE_TABLET | Freq: Once | ORAL | Status: AC
  Administered 2018-03-01: 40 meq via ORAL
  Filled 2018-03-01: qty 2

## 2018-03-01 NOTE — ED Notes (Signed)
Patient verbalizes understanding of discharge instructions. Opportunity for questioning and answers were provided. Armband removed by staff, pt discharged from ED. E signature not available.

## 2018-03-01 NOTE — ED Notes (Signed)
ED Provider at bedside. 

## 2018-03-01 NOTE — ED Provider Notes (Signed)
[facility] EMERGENCY DEPARTMENT Provider Note   CSN: [id number] Arrival date & time: 03/01/18  2041     History   Chief Complaint Chief Complaint  Patient presents with  . SVT    HPI [name] is a 62 y.o. female.  The history is provided by the patient and the EMS personnel.  Palpitations   This is a recurrent problem. The current episode started yesterday. The problem occurs constantly. The problem has been resolved. Associated with: albuterol inhaler. On average, each episode lasts 1 day. Associated symptoms include chest pain, chest pressure and cough. Pertinent negatives include no fever, no near-syncope, no abdominal pain, no nausea, no vomiting, no headaches, no back pain, no lower extremity edema and no shortness of breath.  -h/o SVT. Had SVT with EMS rate 170s, failed vagal maneuvers, resolved after 93m of adenosine PTA. Pt now CP free.  Past Medical History:  Diagnosis Date  . Anxiety    doesn't take any meds for this  . Arteriosclerosis of coronary artery 08/31/2011   Myoview 9/12: EF 44, no ischemia // Myoview 8/18: EF 57, low risk, possible small area of inferior ischemia // Minor nonobstructive CAD by cardiac catheterization - LHC 8/18: Proximal LAD 35, OM2 25  . Arthritis    knuckles  . Asthma   . Carotid artery disease ([place]    Carotid [place]7/14: Bilateral ICA 40-59 // Carotid [place]([facility]) 2/17: bilat plaque without sig stenosis  . Chronic diastolic CHF (congestive heart failure) ([facility] 07/26/2017   Echo 7/18:Severe conc LVH, EF 60-65, no RWMA, Gr 2 DD, MAC, mild MR // right and left heart cath 8/18: LVEDP 21  . Claustrophobia 07/26/2012  . COPD (chronic obstructive pulmonary disease) ([place]    "chronic bronchitis - about every winter"  . CSF leak    dizziness/headache - assoc symptoms // 2/2 encephalocele s/p skull base reconstruction  . Diverticulosis   . History of CVA (cerebrovascular accident) 03/2004   denies residual 07/26/2012  .  Hx of gout   . Hyperlipidemia    takes Pravastatin daily  . Hypertensive heart disease with chronic diastolic congestive heart failure ([place] 07/26/2017  . Internal hemorrhoids   . Kidney stones   . LVH (left ventricular hypertrophy)    Echo 8/18: Severe conc LVH, EF 60-65, no RWMA, Gr 2 DD, MAC, mild MR  . OSA (obstructive sleep apnea) 07/26/2012   prior CPAP - stopped due to being primary caregiver for dying parent (stopped in 2016)  . PSVT (paroxysmal supraventricular tachycardia) ([place]   . Pulmonary hypertension, unspecified (HT[place] 08/14/2017   Right heart cath 8/13:RVSP 66, PASP 57, mean PA 39, mean PWCP 18, LVEDP 21  . Type II diabetes mellitus ([facility]     Patient Active Problem List   Diagnosis Date Noted  . Cigarette smoker 09/01/2017  . Upper airway cough syndrome 08/31/2017  . Pulmonary hypertension, unspecified ([place] 08/14/2017  . Chronic diastolic CHF (congestive heart failure) (H[place]as[place] 07/26/2017  . Hypertensive heart disease with chronic diastolic congestive heart failure ([place] 07/26/2017  . Paroxysmal SVT (supraventricular tachycardia) ([facility] 07/26/2017  . COPD  GOLD 0 still smoking 07/13/2017  . OSA (obstructive sleep apnea) 10/17/2014  . Influenza-like illness 12/14/2013  . Right knee pain 10/22/2013  . Pain of finger of right hand 10/22/2013  . Breast complaint 10/22/2013  . Pain in joint, multiple sites 09/21/2013  . Carotid artery disease ([place] 06/03/2013  . Dizziness 02/16/2013  . Headache(784.0) 01/17/2013  . Depression 12/12/2012  .  Tobacco abuse counseling 11/08/2012  . Weakness of left side of body 07/26/2012  . Uric acid kidney stone 07/04/2012  . CSF leak from nose 10/19/2011  . Low back pain radiating to both legs 10/19/2011  . Arteriosclerosis of coronary artery 08/31/2011  . Poorly controlled type 2 diabetes mellitus with circulatory disorder ([place]) 08/31/2011  . HLD (hyperlipidemia) 07/02/2011  . Morbid obesity due to excess calories ([place]) 06/20/2011     Past Surgical History:  Procedure Laterality Date  . ESOPHAGOGASTRODUODENOSCOPY (EGD) WITH PROPOFOL N/A 07/13/2015   Procedure: ESOPHAGOGASTRODUODENOSCOPY (EGD) WITH PROPOFOL;  Surgeon: Carol Ada, MD;  [facility];  Service: Endoscopy;  Laterality: N/A;  . NASAL SINUS SURGERY  01/29/2012   Procedure: ENDOSCOPIC SINUS SURGERY WITH STEALTH;  Surgeon: Ruby Cola, MD;  Location: [facility];  Service: ENT;  Laterality: N/A;  . NM MYOVIEW LTD  08/25/2011   Low risk study, no evidence of ischemia, EF 44%  . REFRACTIVE SURGERY  ~ 2010   left  . RIGHT/LEFT HEART CATH AND CORONARY ANGIOGRAPHY N/A 07/30/2017   Procedure: RIGHT/LEFT HEART CATH AND CORONARY ANGIOGRAPHY;  Surgeon: Martinique, Peter M, MD;  Location: [facility] CV LAB;  Service: Cardiovascular;  Laterality: N/A;  . SPHENOIDECTOMY  01/29/2012   Procedure: SPHENOIDECTOMY;  Surgeon: Ruby Cola, MD;  Location: [facility];  Service: ENT;  Laterality: Left;  REPAIR OF LEFT SPHENOID    . TOTAL ABDOMINAL HYSTERECTOMY  1994     OB History    Gravida  0   Para      Term      Preterm      AB      Living        SAB      TAB      Ectopic      Multiple      Live Births               Home Medications    Prior to Admission medications   Medication Sig Start Date End Date Taking? Authorizing Provider  acetaminophen (TYLENOL) 325 MG tablet Take 650 mg by mouth every 6 (six) hours as needed for mild pain.    [provider]  aspirin EC 81 MG tablet Take 1 tablet (81 mg total) by mouth daily. 07/13/17   Richardson Dopp T, PA-C  baclofen (LIORESAL) 10 MG tablet Take 10 mg by mouth 2 (two) times daily.     [provider]  busPIRone (BUSPAR) 15 MG tablet TAKE 15 MG BY MOUTH 3 TIMES A DAY 06/17/16   [provider]  CRESTOR 20 MG tablet Take 20 mg by mouth daily.  02/18/15   [provider]  FLUoxetine (PROZAC) 10 MG capsule Take 4 capsules (40 mg total) by mouth at bedtime. 11/24/13   Losq,  Burnell Blanks, MD  furosemide (LASIX) 40 MG tablet Take 1 tablet (40 mg total) by mouth daily. 07/13/17 10/11/17  Richardson Dopp T, PA-C  gabapentin (NEURONTIN) 400 MG capsule Take 1 capsule (400 mg total) by mouth 3 (three) times daily. 06/19/14   Karamalegos, Devonne Doughty, DO  glycopyrrolate (ROBINUL) 2 MG tablet Take 1 tablet (2 mg total) by mouth 2 (two) times daily. 05/01/17   Pyrtle, Lajuan Lines, MD  insulin aspart (NOVOLOG FLEXPEN) 100 UNIT/ML FlexPen Inject 30-40 Units into the skin 3 (three) times daily with meals. 03/12/17   Philemon Kingdom, MD  Insulin Glargine (LANTUS SOLOSTAR) 100 UNIT/ML Solostar Pen INJECT 90 UNITS INTO THE SKIN 2 (TWO)  TIMES DAILY. MUST KEEP UPCOMING APPT 02/05/18   Philemon Kingdom, MD  insulin glargine (LANTUS) 100 UNIT/ML injection INJECT 120 UNITS INTO THE SKIN IN THE MORNING AND 100 UNITS INTO THE SKIN AT BEDTIME    [provider]  isosorbide mononitrate (IMDUR) 60 MG 24 hr tablet Take 1 tablet (60 mg total) by mouth daily. 08/14/17 11/12/17  Richardson Dopp T, PA-C  levalbuterol (XOPENEX HFA) 45 MCG/ACT inhaler Inhale 1 puff into the lungs every 4 (four) hours as needed for wheezing. 06/22/17 06/22/18  Long, Wonda Olds, MD  methocarbamol (ROBAXIN) 750 MG tablet Take 750 mg by mouth every 6 (six) hours as needed for muscle spasms. 04/29/17   [provider]  mometasone-formoterol (DULERA) 100-5 MCG/ACT AERO Take 2 puffs first thing in am and then another 2 puffs about 12 hours later. 01/13/18   Tanda Rockers, MD  mometasone-formoterol (DULERA) 100-5 MCG/ACT AERO Inhale 2 puffs into the lungs 2 (two) times daily. 01/13/18   Tanda Rockers, MD  nitroGLYCERIN (NITROSTAT) 0.4 MG SL tablet Place 1 tablet (0.4 mg total) under the tongue every 5 (five) minutes as needed. 07/27/17   Richardson Dopp T, PA-C  omeprazole (PRILOSEC) 40 MG capsule Take 30- 60 min before your first and last meals of the day 01/13/18   Tanda Rockers, MD  potassium chloride SA (KLOR-CON M20) 20 MEQ tablet  Take 1 tablet (20 mEq total) by mouth daily. 07/13/17 10/11/17  Richardson Dopp T, PA-C  pregabalin (LYRICA) 75 MG capsule Take 1 capsule (75 mg total) by mouth 2 (two) times daily. 09/14/17   Trula Slade, DPM  pregabalin (LYRICA) 75 MG capsule Take 1 capsule (75 mg total) by mouth 2 (two) times daily. 09/14/17   Trula Slade, DPM  topiramate (TOPAMAX) 25 MG tablet Take 25 mg by mouth twice daily 11/25/12   [provider]  Verapamil HCl CR 300 MG CP24 Take 1 capsule (300 mg total) by mouth daily. 07/13/17   Liliane Shi, PA-C    Family History Family History  Problem Relation Age of Onset  . Colon cancer Sister 82  . Breast cancer Mother 19  . Alzheimer's disease Mother   . Diabetes Sister 34  . Heart failure Sister   . Diabetes Sister 59  . Lung cancer Father   . Breast cancer Sister   . Cancer Sister        mouth  . Anesthesia problems Neg Hx   . Hypotension Neg Hx   . Malignant hyperthermia Neg Hx   . Pseudochol deficiency Neg Hx     Social History Social History   Tobacco Use  . Smoking status: Current Every Day Smoker    Packs/day: 0.50    Years: 35.00    Pack years: 17.50    Types: Cigarettes  . Smokeless tobacco: Never Used  . Tobacco comment: 07/26/2012 has decreased smoking from 2ppd; offered cessation materials; pt declines  Substance Use Topics  . Alcohol use: Yes    Alcohol/week: 0.6 oz    Types: 1 Glasses of wine per week    Comment: 3 beers monthly  . Drug use: No     Allergies   Atorvastatin; Ibuprofen; Metformin and related; Ace inhibitors; Hydrocodone-acetaminophen; and Albuterol   Review of Systems Review of Systems  Constitutional: Negative for chills and fever.  HENT: Negative for ear pain and sore throat.   Eyes: Negative for visual disturbance.  Respiratory: Positive for cough. Negative for shortness of breath.  Cardiovascular: Positive for chest pain and palpitations. Negative for near-syncope.  Gastrointestinal:  Negative for abdominal pain, nausea and vomiting.  Genitourinary: Negative for dysuria.  Musculoskeletal: Negative for back pain.  Skin: Negative for rash.  Neurological: Negative for syncope, light-headedness and headaches.  All other systems reviewed and are negative.    Physical Exam Updated Vital Signs BP 125/64   Pulse 71   Resp 20   SpO2 94%   Physical Exam  Constitutional: She is oriented to person, place, and time. She appears well-developed and well-nourished. No distress.  HENT:  Head: Normocephalic and atraumatic.  Mouth/Throat: Oropharynx is clear and moist.  Eyes: Conjunctivae are normal.  Neck: Neck supple.  Cardiovascular: Normal rate, regular rhythm, intact distal pulses and normal pulses.  No murmur heard. Pulmonary/Chest: Effort normal and breath sounds normal. No respiratory distress. She has no decreased breath sounds. She has no wheezes. She has no rhonchi. She has no rales.  Abdominal: Soft. She exhibits no distension. There is no tenderness. There is no guarding.  Musculoskeletal: She exhibits no edema.  Neurological: She is alert and oriented to person, place, and time.  Skin: Skin is warm and dry.  Psychiatric: She has a normal mood and affect.  Nursing note and vitals reviewed.    ED Treatments / Results  Labs (all labs ordered are listed, but only abnormal results are displayed) Labs Reviewed  BASIC METABOLIC PANEL - Abnormal; Notable for the following components:      Result Value   Potassium 3.4 (*)    Chloride 96 (*)    Glucose, Bld 349 (*)    Creatinine, Ser 1.28 (*)    Calcium 8.1 (*)    GFR calc non Af Amer 45 (*)    GFR calc Af Amer 52 (*)    All other components within normal limits  CBC - Abnormal; Notable for the following components:   RBC 5.19 (*)    Hemoglobin 15.6 (*)    HCT 46.8 (*)    All other components within normal limits  MAGNESIUM  I-STAT TROPONIN, ED  I-STAT BETA HCG BLOOD, ED ([facility], [facility], [facility] ONLY)     EKG None  Radiology Dg Chest 2 View  Result Date: 03/01/2018 CLINICAL DATA:  Pt c/o SVT, cough, and congestion x 1 day. Hx of asthma, CHF, COPD, AND DM. Pt is a current smoker. EXAM: CHEST - 2 VIEW COMPARISON:  06/18/2017 FINDINGS: Cardiac silhouette is mildly enlarged. No mediastinal or hilar masses. No convincing adenopathy. Lungs are clear. No pleural effusion or pneumothorax. Skeletal structures are intact. IMPRESSION: No acute cardiopulmonary disease. Electronically Signed   By: Lajean Manes M.D.   On: 03/01/2018 21:30    Procedures Procedures (including critical care time)  Medications Ordered in ED Medications  lactated ringers bolus 1,000 mL (has no administration in time range)  potassium chloride SA (K-DUR,KLOR-CON) CR tablet 40 mEq (40 mEq Oral Given 03/01/18 2215)     Initial Impression / Assessment and Plan / ED Course  I have reviewed the triage vital signs and the nursing notes.  Pertinent labs & imaging results that were available during my care of the patient were reviewed by me and considered in my medical decision making (see chart for details).    Patient is a 62 year old female with history of CHF anxiety, COPD, SVT, hyperlipidemia, depression who presents with SVT from EMS.  She had reportedly been having palpitations for whole day and failed vagal maneuvers.  EMS gave 6 mg of  adenosine x1 which resolved the SVT.  Patient states she was having chest pain all day with the SVT.  Patient states she has not compliant with her diltiazem and states she was feeling fine last few months so she stopped taking it.  Patient is noncompliant with many of her medicines.  She states she has no chest pain free.    On exam, patient is in normal sinus rhythm with a rate in the 70s.  Her vitals are otherwise reassuring.  Patient placed in a cardiac monitor.  Labs obtained as above.  Patient is mildly hypokalemic at 3.4.  Magnesium was within normal limits at 1.7.  Patient has had  a diarrheal illness recently.  Patient's potassium repleted orally here.  Patient has had no further SVT episodes after being in the ED almost 2 hours.  EKG here does not show any obvious signs of ischemia and her troponin is negative.  Her chest pain is likely secondary to the SVT and some demand ischemia as opposed to true ACS.  Patient at this time would like to be discharged home as she is feeling well.  Patient to call [facility] cardiology tomorrow to discuss need for follow-up and whether or not she should resume taking her verapamil.  Patient discharged and good condition.   Final Clinical Impressions(s) / ED Diagnoses   Final diagnoses:  SVT (supraventricular tachycardia) [facility])    ED Discharge Orders    None       Tobie Poet, DO 03/01/18 2241    Little, Wenda Overland, MD 03/04/18 2135

## 2018-03-01 NOTE — Progress Notes (Signed)
Subjective: Ms. Rikard returns the office today for painful, elongated, thickened toenails which she cannot trim herself. Denies any redness or drainage around the nails. Denies any acute changes since last appointment and no new complaints today. Denies any systemic complaints such as fevers, chills, nausea, vomiting.   Objective: AAO 3, NAD DP/PT pulses palpable, CRT less than 3 seconds Sensation decreased with SWMF.  Nails hypertrophic, dystrophic, elongated, brittle, discolored 9. There is tenderness overlying the nails 1-5 bilaterally except the left hallux nail which has previously been removed. There is no surrounding erythema or drainage along the nail sites. Small pieces of nail and the corners of the left hallux toenail does cause intermittent pain.  No open lesions or pre-ulcerative lesions are identified. No other areas of tenderness bilateral lower extremities. No overlying edema, erythema, increased warmth. No pain with calf compression, swelling, warmth, erythema.  Assessment: Patient presents with symptomatic onychomycosis; neuropathy  Plan: -Treatment options including alternatives, risks, complications were discussed -Nails sharply debrided 9 without complication/bleeding. -Discussed daily foot inspection. If there are any changes, to call the office immediately.  -Follow-up in 3 months or sooner if any problems are to arise. In the meantime, encouraged to call the office with any questions, concerns, changes symptoms.  Celesta Gentile, DPM

## 2018-03-01 NOTE — ED Triage Notes (Signed)
Per [facility]: Patient to ED from home c/o SVT - hx SVT - per EMS, has been non-compliant because "it hasn't happened in a while." Patient hx COPD, CHF, CVA, DM - CBG 399 at home, took her insulin - 352 with EMS. HR upon EMS arrival 180 - given 6mg  adenosine - pt converted to sinus arrhythmia HR 80 with infrequent PVCs. RA SpO2 93%, placed on 2L O2 - 98%. Patient A&O x 4. Pt endorses cough and congestion - was supposed to see her PCP for x-ray but hasn't had it scheduled yet. Denies fevers.

## 2018-03-10 ENCOUNTER — Encounter: Payer: Self-pay | Admitting: Internal Medicine

## 2018-03-10 ENCOUNTER — Ambulatory Visit (INDEPENDENT_AMBULATORY_CARE_PROVIDER_SITE_OTHER): Payer: Medicare Other | Admitting: Internal Medicine

## 2018-03-10 DIAGNOSIS — R05 Cough: Secondary | ICD-10-CM

## 2018-03-10 DIAGNOSIS — J449 Chronic obstructive pulmonary disease, unspecified: Secondary | ICD-10-CM | POA: Diagnosis not present

## 2018-03-10 DIAGNOSIS — Z122 Encounter for screening for malignant neoplasm of respiratory organs: Secondary | ICD-10-CM | POA: Diagnosis not present

## 2018-03-10 DIAGNOSIS — R058 Other specified cough: Secondary | ICD-10-CM

## 2018-03-10 DIAGNOSIS — F17210 Nicotine dependence, cigarettes, uncomplicated: Secondary | ICD-10-CM | POA: Diagnosis not present

## 2018-03-10 DIAGNOSIS — R059 Cough, unspecified: Secondary | ICD-10-CM

## 2018-03-10 MED ORDER — MOMETASONE FURO-FORMOTEROL FUM 100-5 MCG/ACT IN AERO: INHALATION_SPRAY | RESPIRATORY_TRACT | 0 refills | Status: DC

## 2018-03-10 NOTE — Progress Notes (Signed)
Subjective:     Patient ID: [name], female   DOB: October 09, 1956,    MRN: [id number]    Brief patient profile:  61 yowf active smoker prev eval by [facility] for OSA last ov 04/2015  rec Get back on your cpap with the new mask, and let us know if you are not doing well.  Will send an order to your home care company to increase the pressure range on your device. Work on [employer] loss F/u Sood > did not do     History of Present Illness  08/31/2017 1st [facility] office visit/ [name]   Chief Complaint  Patient presents with  . pulmonary consult    COPD  Cpap was canceled x 2 years with tendency to sense of immediate orthopnea x years not better on cpap which never improved and no worse since stopped it and then coughing started winter of 2018 day and noct white mucus and doe x 50 ft gradual onset/ progressively worse and no better on xopenex  rec Prilosec 40 mg Take 30- 60 min before your first and last meals of the day  GERD diet  The key is to stop smoking completely before smoking completely stops you!        01/13/2018  f/u ov/[name] re:  Sob and cough  With restrictive changes on pfts 01/13/2018  Chief Complaint  Patient presents with  . Follow-up    PFT's done today. Increased cough x 3 months- prod with clear to green sputum.  She does not have a rescue inhaler.   Dyspnea:  MMRC2 = can't walk a nl pace on a flat grade s sob but does fine slow and flat HT / [employer] ok / uses [facility] parking  Due to back and breathing  Cough: worse x 3 months  Esp noct  Sleep: 45 degrees feels fine but sister says she coughs all noct rec Omeprazole 40 mg Take 30- 60 min before your first and last meals of the day  Dulera 100 Take 2 puffs first thing in am and then another 2 puffs about 12 hours later.  - if not 100% sure it's helping, try off it after the 2 weeks is up  - if all better, fill the prescription  Work on inhaler technique:  Augmentin 875 mg take one pill twice daily  X 10 days -  take at breakfast and supper with large glass of water.  It would help reduce the usual side effects (diarrhea and yeast infections) if you ate cultured yogurt at lunch.  GERD diet  -   . 03/10/2018  f/u ov/[name] re: cough since Dec 2018 / still smoking   - GOLD 0/ concerned re lung ca Chief Complaint  Patient presents with  . Follow-up    Cough has only improved slightly. She states it mainly bothers her at night and keeps her awake.    Dyspnea:  MMRC1 = can walk nl pace, flat grade, can't hurry or go uphills or steps s sob   Cough: daytime cough is better/ noct cough w/in an hour of hs 5/7 nocts> min mucoid Sleep: 45 degrees no cpap  SABA use:  Rare xopenex / did not take dulera as rec   No obvious patterns in  day to day or daytime variability or assoc excess/ purulent sputum or mucus plugs or hemoptysis or cp or chest tightness, subjective wheeze or overt sinus or hb symptoms. No unusual exposure hx or h/o childhood pna/ asthma  or knowledge of premature birth.   . Also denies any obvious fluctuation of symptoms with weather or environmental changes or other aggravating or alleviating factors except as outlined above   Current Allergies, Complete Past Medical History, Past Surgical History, Family History, and Social History were reviewed in [employer] record.  ROS  The following are not active complaints unless bolded Hoarseness, sore throat, dysphagia, dental problems, itching, sneezing,  nasal congestion or discharge of excess mucus or purulent secretions, ear ache,   fever, chills, sweats, unintended wt loss or wt gain, classically pleuritic or exertional cp,  orthopnea pnd or leg swelling, presyncope, palpitations, abdominal pain, anorexia, nausea, vomiting, diarrhea  or change in bowel habits or change in bladder habits, change in stools or change in urine, dysuria, hematuria,  rash, arthralgias, visual complaints, headache, numbness, weakness or ataxia or problems  with walking or coordination,  change in mood/affect or memory.        Current Meds  Medication Sig  . acetaminophen (TYLENOL) 325 MG tablet Take 650 mg by mouth every 6 (six) hours as needed for mild pain.  Marland Kitchen aspirin EC 81 MG tablet Take 1 tablet (81 mg total) by mouth daily.  . baclofen (LIORESAL) 10 MG tablet Take 10 mg by mouth 2 (two) times daily.   . busPIRone (BUSPAR) 15 MG tablet TAKE 15 MG BY MOUTH 3 TIMES A DAY  . CRESTOR 20 MG tablet Take 20 mg by mouth daily.   Marland Kitchen FLUoxetine (PROZAC) 10 MG capsule Take 4 capsules (40 mg total) by mouth at bedtime.  . gabapentin (NEURONTIN) 400 MG capsule Take 1 capsule (400 mg total) by mouth 3 (three) times daily.  Marland Kitchen glycopyrrolate (ROBINUL) 2 MG tablet Take 1 tablet (2 mg total) by mouth 2 (two) times daily.  . insulin aspart (NOVOLOG FLEXPEN) 100 UNIT/ML FlexPen Inject 30-40 Units into the skin 3 (three) times daily with meals.  . Insulin Glargine (LANTUS SOLOSTAR) 100 UNIT/ML Solostar Pen INJECT 90 UNITS INTO THE SKIN 2 (TWO) TIMES DAILY. MUST KEEP UPCOMING APPT  . insulin glargine (LANTUS) 100 UNIT/ML injection INJECT 120 UNITS INTO THE SKIN IN THE MORNING AND 100 UNITS INTO THE SKIN AT BEDTIME  . levalbuterol (XOPENEX HFA) 45 MCG/ACT inhaler Inhale 1 puff into the lungs every 4 (four) hours as needed for wheezing.  . methocarbamol (ROBAXIN) 750 MG tablet Take 750 mg by mouth every 6 (six) hours as needed for muscle spasms.  . mometasone-formoterol (DULERA) 100-5 MCG/ACT AERO Take 2 puffs first thing in am and then another 2 puffs about 12 hours later.  . nitroGLYCERIN (NITROSTAT) 0.4 MG SL tablet Place 1 tablet (0.4 mg total) under the tongue every 5 (five) minutes as needed.  Marland Kitchen omeprazole (PRILOSEC) 40 MG capsule Take 30- 60 min before your first and last meals of the day  . pregabalin (LYRICA) 75 MG capsule Take 1 capsule (75 mg total) by mouth 2 (two) times daily.  . pregabalin (LYRICA) 75 MG capsule Take 1 capsule (75 mg total) by mouth 2  (two) times daily.  Marland Kitchen topiramate (TOPAMAX) 25 MG tablet Take 25 mg by mouth twice daily  . Verapamil HCl CR 300 MG CP24 Take 1 capsule (300 mg total) by mouth daily.  .     .                   Objective:   Physical Exam  Pleasant amb wf nad    03/10/2018  240   01/13/2018        248   08/31/17 252 lb 6 oz (114.5 kg)  08/14/17 251 lb 12.8 oz (114.2 kg)  07/30/17 252 lb (114.3 kg)    Vital signs reviewed - Note on arrival 02 sats  95% on RA    HEENT: nl dentition, turbinates bilaterally, and oropharynx. Nl external ear canals without cough reflex   NECK :  without JVD/Nodes/TM/ nl carotid upstrokes bilaterally   LUNGS: no acc muscle use,  Nl contour chest which is clear to A and P bilaterally without cough on insp or exp maneuvers   CV:  RRR  no s3 or murmur or increase in P2, and no edema   ABD:  soft and nontender with nl inspiratory excursion in the supine position. No bruits or organomegaly appreciated, bowel sounds nl  MS:  Nl gait/ ext warm without deformities, calf tenderness, cyanosis or clubbing No obvious joint restrictions   SKIN: warm and dry without lesions    NEURO:  alert, approp, nl sensorium with  no motor or cerebellar deficits apparent.                  Assessment:

## 2018-03-10 NOTE — Patient Instructions (Addendum)
We will call you with Low dose CT schedule   Try dulera 100 Take 2 puffs first thing in am and then another 2 puffs about 12 hours later until you use your samples up and if not better don't fill the prescription   Work on inhaler technique:  relax and gently blow all the way out then take a nice smooth deep breath back in, triggering the inhaler at same time you start breathing in.  Hold for up to 5 seconds if you can. Blow out thru nose. Rinse and gargle with water when done    Please schedule a follow up visit in 3 months but call sooner if needed

## 2018-03-11 ENCOUNTER — Ambulatory Visit (INDEPENDENT_AMBULATORY_CARE_PROVIDER_SITE_OTHER): Payer: Medicare Other | Admitting: Physician Assistant

## 2018-03-11 ENCOUNTER — Encounter: Payer: Self-pay | Admitting: Internal Medicine

## 2018-03-11 ENCOUNTER — Encounter: Payer: Self-pay | Admitting: Physician Assistant

## 2018-03-11 VITALS — BP 116/68 | HR 74 | Ht 62.0 in | Wt 243.1 lb

## 2018-03-11 DIAGNOSIS — Z860100 Personal history of colon polyps, unspecified: Secondary | ICD-10-CM

## 2018-03-11 DIAGNOSIS — R1084 Generalized abdominal pain: Secondary | ICD-10-CM | POA: Diagnosis not present

## 2018-03-11 MED ORDER — DICYCLOMINE HCL 20 MG PO TABS: ORAL_TABLET | ORAL | 0 refills | Status: DC

## 2018-03-11 NOTE — Assessment & Plan Note (Signed)
Body mass index is 43.9 kg/m.  -  Trending down/ encouraged Lab Results  Component Value Date   TSH 2.800 06/18/2017     Contributing to gerd risk/ doe/reviewed the need and the process to achieve and maintain neg calorie balance > defer f/u primary care including intermittently monitoring thyroid status

## 2018-03-11 NOTE — Assessment & Plan Note (Signed)
Max rx for GERD 08/31/2017 > did not do as of 01/13/2018  - retry max rx for gerd 01/13/2018 plus augmentin x 10 days   - CT sinus next step

## 2018-03-11 NOTE — Patient Instructions (Signed)
If you are age 60 or older, your body mass index should be between 23-30. Your Body mass index is 44.47 kg/m. If this is out of the aforementioned range listed, please consider follow up with your Primary Care Provider.  If you are age 45 or younger, your body mass index should be between 19-25. Your Body mass index is 44.47 kg/m. If this is out of the aformentioned range listed, please consider follow up with your Primary Care Provider.   We have sent the following medications to your pharmacy for you to pick up at your convenience: Dicyclomine  20 mg 20-30 minutes before meals if you are eating otherwise just take four times a day.  Your provider has requested that you have an abdominal x ray before leaving today. Please go to the basement floor to our Radiology department for the test.  You have been scheduled for a CT scan of the abdomen and pelvis at [facility] ([address]---this is in the same building as [facility]).   You are scheduled on 03/17/18 at 3:30pm. You should arrive 15 minutes prior to your appointment time for registration. Please follow the written instructions below on the day of your exam:  WARNING: IF YOU ARE ALLERGIC TO IODINE/X-RAY DYE, PLEASE NOTIFY RADIOLOGY IMMEDIATELY AT [phone]! YOU WILL BE GIVEN A 13 HOUR PREMEDICATION PREP.  1) Do not eat or drink anything after 11:30am (4 hours prior to your test) 2) You have been given 2 bottles of oral contrast to drink. The solution may taste better if refrigerated, but do NOT add ice or any other liquid to this solution. Shake well before drinking.    Drink 1 bottle of contrast @ 1;45pm (2 hours prior to your exam)  Drink 1 bottle of contrast @ 2;45pm (1 hour prior to your exam)  You may take any medications as prescribed with a small amount of water except for the following: Metformin, Glucophage, Glucovance, Avandamet, Riomet, Fortamet, Actoplus Met, Janumet, Glumetza or Metaglip. The above  medications must be held the day of the exam AND 48 hours after the exam.  The purpose of you drinking the oral contrast is to aid in the visualization of your intestinal tract. The contrast solution may cause some diarrhea. Before your exam is started, you will be given a small amount of fluid to drink. Depending on your individual set of symptoms, you may also receive an intravenous injection of x-ray contrast/dye. Plan on being at [facility] for 30 minutes or longer, depending on the type of exam you are having performed.  This test typically takes 30-45 minutes to complete.  If you have any questions regarding your exam or if you need to reschedule, you may call the CT department at [phone] between the hours of 8:00 am and 5:00 pm, Monday-Friday.  ________________________________________________________________________

## 2018-03-11 NOTE — Assessment & Plan Note (Addendum)
>   3 min  Low-dose CT lung cancer screening is recommended for patients who are 73-61 years of age with a 30+ pack-year history of smoking, and who are currently smoking or quit <=15 years ago.   She meets criteria but if she really wants to reduce cancer risk she should stop smoking now > advised  Discussed in detail all the  indications, usual  risks and alternatives  relative to the benefits with patient who agrees to proceed with w/u as outlined.

## 2018-03-11 NOTE — Assessment & Plan Note (Signed)
PFT's  01/13/2018  FEV1 1.36 (57 % ) ratio 87  p 6 % improvement from saba p nothing prior to study with DLCO  61 % corrects to 104  % for alv volume  - 01/13/2018     try dulera 100 2bid  >  Did not use consistently   - 03/10/2018  After extensive coaching inhaler device  effectiveness =    75% from baseline < 50%  So retry duler 100 2bid  Noct cough already on gerd Rx is either related to non-acid gerd or AB both made worse by active smoking (see separate a/p)   Will re-try dulera 100 2bid with f/u CT Sinus and HRCT   I had an extended discussion with the patient reviewing all relevant studies completed to date and  lasting 15 to 20 minutes of a 25 minute visit    Each maintenance medication was reviewed in detail including most importantly the difference between maintenance and prns and under what circumstances the prns are to be triggered using an action plan format that is not reflected in the computer generated alphabetically organized AVS.    Please see AVS for specific instructions unique to this visit that I personally wrote and verbalized to the the pt in detail and then reviewed with pt  by my nurse highlighting any  changes in therapy recommended at today's visit to their plan of care.

## 2018-03-11 NOTE — Progress Notes (Addendum)
Chief Complaint: Abdominal pain  HPI:    [name] is a 62 year old female with a past medical history of colon polyps, family history of colon cancer, obesity, hypertension, hyperlipidemia, kidney stones, diabetes, COPD, who follows with Dr. Hilarie Fredrickson and presents to clinic today with a complaint of abdominal pain.    10/08/16 colonoscopy with benign polyps which were removed throughout the colon, lipoma in the ascending colon, diverticulosis in the ascending and left colon, internal hemorrhoids.  Pathology showed tubular adenoma and hyperplastic polyps.  Due in November 2020.    02/11/17 office visit Dr. Hilarie Fredrickson for mid abdominal cramping pain which felt like menstrual cramping.  Associated with upper abdominal bloating and tightness.  Discussed this could be irritable bowel syndrome.  Upper endoscopy in 2016 was unremarkable, colonoscopy with no colitis or source for pain.  Recommended abdominal ultrasound to rule out gallstones.  Instructed to use Omeprazole 20 mg on a daily basis for 2-4 weeks.  Also prescribed Robinul Forte 2 mg every 12 hours as needed antispasmodic.  Recommend a gastric emptying study if his symptoms did not improve.    02/20/17 abdominal ultrasound with diffuse increased echotexture throughout the liver compatible with fatty infiltration.  No acute findings.  03/20/17 gastric emptying study normal.    03/01/18 ER for SVT.    Today, expresses that she has had worsening of her chronic abdominal pain over the past year describing that this is constant and feels like a toothache rated as a 7-8/10 which sometimes increases to a 10/10 and feels like a "knife scraping out my insides".  Uses Ibuprofen sometimes 2-3 times a day for this pain although she feels as though she is "addicted" to this medication and tries not to use it that much.  Sometimes this hurts so much that she just cries and lays in bed.  Her omeprazole 40 mg was just increased to twice daily a couple of weeks ago and  patient has not noticed any change in pain with this.  Tthe glycopyrrolate helps "a little bit".  No certain triggers for this pain.    Also describes running to the bathroom with typically loose stools, but sometimes constipation, after eating multiple times a day.    Denies fever, chills, blood in her stool, melena, weight loss, anorexia, nausea, vomiting, heartburn, reflux or symptoms that awaken her at night.  Past Medical History:  Diagnosis Date  . Anxiety    doesn't take any meds for this  . Arteriosclerosis of coronary artery 08/31/2011   Myoview 9/12: EF 44, no ischemia // Myoview 8/18: EF 57, low risk, possible small area of inferior ischemia // Minor nonobstructive CAD by cardiac catheterization - LHC 8/18: Proximal LAD 35, OM2 25  . Arthritis    knuckles  . Asthma   . Carotid artery disease ([place])    Carotid US 7/14: Bilateral ICA 40-59 // Carotid US ([facility]) 2/17: bilat plaque without sig stenosis  . Chronic diastolic CHF (congestive heart failure) ([place]) 07/26/2017   Echo 7/18:Severe conc LVH, EF 60-65, no RWMA, Gr 2 DD, MAC, mild MR // right and left heart cath 8/18: LVEDP 21  . Claustrophobia 07/26/2012  . COPD (chronic obstructive pulmonary disease) ([place])    "chronic bronchitis - about every winter"  . CSF leak    dizziness/headache - assoc symptoms // 2/2 encephalocele s/p skull base reconstruction  . Diverticulosis   . History of CVA (cerebrovascular accident) 03/2004   denies residual 07/26/2012  . Hx of gout   .  Hyperlipidemia    takes Pravastatin daily  . Hypertensive heart disease with chronic diastolic congestive heart failure ([place]) 07/26/2017  . Internal hemorrhoids   . Kidney stones   . LVH (left ventricular hypertrophy)    Echo 8/18: Severe conc LVH, EF 60-65, no RWMA, Gr 2 DD, MAC, mild MR  . OSA (obstructive sleep apnea) 07/26/2012   prior CPAP - stopped due to being primary caregiver for dying parent (stopped in 2016)  . PSVT (paroxysmal supraventricular  tachycardia) ([place])   . Pulmonary hypertension, unspecified ([place]) 08/14/2017   Right heart cath 8/13:RVSP 66, PASP 57, mean PA 39, mean PWCP 18, LVEDP 21  . Type II diabetes mellitus ([place])     Past Surgical History:  Procedure Laterality Date  . ESOPHAGOGASTRODUODENOSCOPY (EGD) WITH PROPOFOL N/A 07/13/2015   Procedure: ESOPHAGOGASTRODUODENOSCOPY (EGD) WITH PROPOFOL;  Surgeon: Carol Ada, MD;  Location: [facility];  Service: Endoscopy;  Laterality: N/A;  . NASAL SINUS SURGERY  01/29/2012   Procedure: ENDOSCOPIC SINUS SURGERY WITH STEALTH;  Surgeon: Ruby Cola, MD;  Location: [facility];  Service: ENT;  Laterality: N/A;  . NM MYOVIEW LTD  08/25/2011   Low risk study, no evidence of ischemia, EF 44%  . REFRACTIVE SURGERY  ~ 2010   left  . RIGHT/LEFT HEART CATH AND CORONARY ANGIOGRAPHY N/A 07/30/2017   Procedure: RIGHT/LEFT HEART CATH AND CORONARY ANGIOGRAPHY;  Surgeon: Martinique, Peter M, MD;  Location: [facility];  Service: Cardiovascular;  Laterality: N/A;  . SPHENOIDECTOMY  01/29/2012   Procedure: SPHENOIDECTOMY;  Surgeon: Ruby Cola, MD;  Location: [facility];  Service: ENT;  Laterality: Left;  REPAIR OF LEFT SPHENOID    . TOTAL ABDOMINAL HYSTERECTOMY  1994    Current Outpatient Medications  Medication Sig Dispense Refill  . acetaminophen (TYLENOL) 325 MG tablet Take 650 mg by mouth every 6 (six) hours as needed for mild pain.    Marland Kitchen aspirin EC 81 MG tablet Take 1 tablet (81 mg total) by mouth daily. 90 tablet 3  . baclofen (LIORESAL) 10 MG tablet Take 10 mg by mouth 2 (two) times daily.     . busPIRone (BUSPAR) 15 MG tablet TAKE 15 MG BY MOUTH 3 TIMES A DAY    . CRESTOR 20 MG tablet Take 20 mg by mouth daily.     Marland Kitchen FLUoxetine (PROZAC) 10 MG capsule Take 4 capsules (40 mg total) by mouth at bedtime. 120 capsule 3  . furosemide (LASIX) 40 MG tablet Take 1 tablet (40 mg total) by mouth daily. 90 tablet 3  . gabapentin (NEURONTIN) 400 MG capsule Take 1 capsule (400 mg total) by mouth 3  (three) times daily. 90 capsule 4  . glycopyrrolate (ROBINUL) 2 MG tablet Take 1 tablet (2 mg total) by mouth 2 (two) times daily. 180 tablet 0  . insulin aspart (NOVOLOG FLEXPEN) 100 UNIT/ML FlexPen Inject 30-40 Units into the skin 3 (three) times daily with meals. 10 pen 5  . Insulin Glargine (LANTUS SOLOSTAR) 100 UNIT/ML Solostar Pen INJECT 90 UNITS INTO THE SKIN 2 (TWO) TIMES DAILY. MUST KEEP UPCOMING APPT 45 pen 0  . insulin glargine (LANTUS) 100 UNIT/ML injection INJECT 120 UNITS INTO THE SKIN IN THE MORNING AND 100 UNITS INTO THE SKIN AT BEDTIME    . isosorbide mononitrate (IMDUR) 60 MG 24 hr tablet Take 1 tablet (60 mg total) by mouth daily. 90 tablet 3  . levalbuterol (XOPENEX HFA) 45 MCG/ACT inhaler Inhale 1 puff into the lungs every 4 (four) hours as  needed for wheezing. 1 Inhaler 2  . methocarbamol (ROBAXIN) 750 MG tablet Take 750 mg by mouth every 6 (six) hours as needed for muscle spasms.    . mometasone-formoterol (DULERA) 100-5 MCG/ACT AERO Take 2 puffs first thing in am and then another 2 puffs about 12 hours later. 1 Inhaler 0  . nitroGLYCERIN (NITROSTAT) 0.4 MG SL tablet Place 1 tablet (0.4 mg total) under the tongue every 5 (five) minutes as needed. 25 tablet 3  . omeprazole (PRILOSEC) 40 MG capsule Take 30- 60 min before your first and last meals of the day 60 capsule 2  . potassium chloride SA (KLOR-CON M20) 20 MEQ tablet Take 1 tablet (20 mEq total) by mouth daily. 90 tablet 3  . pregabalin (LYRICA) 75 MG capsule Take 1 capsule (75 mg total) by mouth 2 (two) times daily. 14 capsule 0  . pregabalin (LYRICA) 75 MG capsule Take 1 capsule (75 mg total) by mouth 2 (two) times daily. 60 capsule 2  . topiramate (TOPAMAX) 25 MG tablet Take 25 mg by mouth twice daily    . Verapamil HCl CR 300 MG CP24 Take 1 capsule (300 mg total) by mouth daily. 90 capsule 3   No current facility-administered medications for this visit.     Allergies as of 03/11/2018 - Review Complete 03/11/2018    Allergen Reaction Noted  . Atorvastatin Other (See Comments) and Nausea And Vomiting 07/26/2012  . Ibuprofen Other (See Comments) 06/18/2017  . Metformin and related Nausea And Vomiting 07/26/2012  . Ace inhibitors Cough and Nausea And Vomiting 04/28/2012  . Hydrocodone-acetaminophen Nausea And Vomiting 06/19/2014  . Albuterol Other (See Comments) 07/30/2017    Family History  Problem Relation Age of Onset  . Colon cancer Sister 73  . Breast cancer Mother 81  . Alzheimer's disease Mother   . Diabetes Sister 7  . Heart failure Sister   . Diabetes Sister 14  . Lung cancer Father   . Breast cancer Sister   . Cancer Sister        mouth  . Anesthesia problems Neg Hx   . Hypotension Neg Hx   . Malignant hyperthermia Neg Hx   . Pseudochol deficiency Neg Hx     Social History   Socioeconomic History  . Marital status: Single    Spouse name: Not on file  . Number of children: 0  . Years of education: Not on file  . Highest education level: Not on file  Occupational History  . Occupation: disabled  Social Needs  . Financial resource strain: Not on file  . Food insecurity:    Worry: Not on file    Inability: Not on file  . Transportation needs:    Medical: Not on file    Non-medical: Not on file  Tobacco Use  . Smoking status: Current Every Day Smoker    Packs/day: 0.50    Years: 35.00    Pack years: 17.50    Types: Cigarettes  . Smokeless tobacco: Never Used  . Tobacco comment: 07/26/2012 has decreased smoking from 2ppd; offered cessation materials; pt declines  Substance and Sexual Activity  . Alcohol use: Yes    Alcohol/week: 0.6 oz    Types: 1 Glasses of wine per week    Comment: 3 beers monthly  . Drug use: No  . Sexual activity: Never    Birth control/protection: Surgical  Lifestyle  . Physical activity:    Days per week: Not on file    Minutes per  session: Not on file  . Stress: Not on file  Relationships  . Social connections:    Talks on phone: Not  on file    Gets together: Not on file    Attends religious service: Not on file    Active member of club or organization: Not on file    Attends meetings of clubs or organizations: Not on file    Relationship status: Not on file  . Intimate partner violence:    Fear of current or ex partner: Not on file    Emotionally abused: Not on file    Physically abused: Not on file    Forced sexual activity: Not on file  Other Topics Concern  . Not on file  Social History Narrative   Currently unemployed.  Really wants to find a job, but has been difficult for her.  No children, not married    Review of Systems:    Constitutional: No weight loss, fever or chills Cardiovascular: No chest pain Respiratory: No SOB  Gastrointestinal: See HPI and otherwise negative   Physical Exam:  Vital signs: BP 116/68   Pulse 74   Ht _0  (1.575 m)   Wt 243 lb 2 oz (110.3 kg)   BMI 44.47 kg/m   Constitutional:   Pleasant morbidly obese Caucasian female appears to be in NAD, Well developed, Well nourished, alert and cooperative Head:  Normocephalic and atraumatic. Eyes:   PEERL, EOMI. No icterus. Conjunctiva pink. Ears:  Normal auditory acuity. Neck:  Supple Throat: Oral cavity and pharynx without inflammation, swelling or lesion.  Respiratory: Respirations even and unlabored. Lungs clear to auscultation bilaterally.   No wheezes, crackles, or rhonchi.  Cardiovascular: Normal S1, S2. No MRG. Regular rate and rhythm. No peripheral edema, cyanosis or pallor.  Gastrointestinal:  Soft, nondistended, marked generalized ttp to only light palpation, with involuntary guarding, Normal bowel sounds. No appreciable masses or hepatomegaly. Rectal:  Not performed.  Msk:  Symmetrical without gross deformities. Without edema, no deformity or joint abnormality.  Neurologic:  Alert and  oriented x4;  grossly normal neurologically.  Skin:   Dry and intact without significant lesions or rashes. Psychiatric: Demonstrates  good judgement and reason without abnormal affect or behaviors.  MOST RECENT LABS AND IMAGING: CBC    Component Value Date/Time   WBC 9.3 03/01/2018 2052   RBC 5.19 (H) 03/01/2018 2052   HGB 15.6 (H) 03/01/2018 2052   HGB 16.0 (H) 07/27/2017 1218   HCT 46.8 (H) 03/01/2018 2052   HCT 47.0 (H) 07/27/2017 1218   PLT 183 03/01/2018 2052   PLT 242 07/27/2017 1218   MCV 90.2 03/01/2018 2052   MCV 92 07/27/2017 1218   MCH 30.1 03/01/2018 2052   MCHC 33.3 03/01/2018 2052   RDW 14.6 03/01/2018 2052   RDW 14.5 07/27/2017 1218   LYMPHSABS 1.9 06/22/2017 1427   MONOABS 0.4 06/22/2017 1427   EOSABS 0.3 06/22/2017 1427   BASOSABS 0.0 06/22/2017 1427    CMP     Component Value Date/Time   NA 135 03/01/2018 2052   NA 142 07/27/2017 1218   K 3.4 (L) 03/01/2018 2052   CL 96 (L) 03/01/2018 2052   CO2 24 03/01/2018 2052   GLUCOSE 349 (H) 03/01/2018 2052   BUN 15 03/01/2018 2052   BUN 13 07/27/2017 1218   CREATININE 1.28 (H) 03/01/2018 2052   CREATININE 1.11 (H) 03/15/2014 1628   CALCIUM 8.1 (L) 03/01/2018 2052   PROT 6.8 12/23/2013 1154   ALBUMIN  4.1 12/23/2013 1154   AST 21 12/23/2013 1154   ALT 17 12/23/2013 1154   ALKPHOS 98 12/23/2013 1154   BILITOT 0.6 12/23/2013 1154   GFRNONAA 45 (L) 03/01/2018 2052   GFRAA 52 (L) 03/01/2018 2052    Assessment: 1.  Generalized abdominal pain: Worsened since March 2018, upper endoscopy unremarkable in 2016, colonoscopy with polypectomy but no colitis or source for abdominal pain in 2017, abdominal ultrasound and gastric emptying study 2017 normal, thought possibly IBS in the past, patient is markedly tender at time of exam today; consider IBS versus inflammatory process 2.  History of colon polyps: Last colonoscopy 2017, repeat recommended 2020  Plan: 1.  Scheduled patient for CT abdomen and pelvis with contrast for further evaluation of her generalized abdominal pain. 2.  Prescribed Dicyclomine 20 mg 4 times daily, 20-30 minutes before  eating meals and at bedtime.  #120 with a refill 3.  Recommend patient discontinue Robinul Forte at this time. 4. Continue Omeprazole 71m BID 5.  Patient to follow in clinic per recommendations after imaging above with Dr. PHilarie Fredricksonor myself.  JEllouise Newer PA-C [facility] 03/11/2018, 1:36 PM  Cc: YRich Fuchs PA   Addendum: Reviewed and agree with initial management. Pyrtle, JLajuan Lines MD

## 2018-03-17 ENCOUNTER — Ambulatory Visit (INDEPENDENT_AMBULATORY_CARE_PROVIDER_SITE_OTHER)
Admission: RE | Admit: 2018-03-17 | Discharge: 2018-03-17 | Disposition: A | Discharge status: Home / Self Care | Payer: Medicare Other | Source: Ambulatory Visit | Attending: Physician Assistant | Admitting: Physician Assistant

## 2018-03-17 ENCOUNTER — Ambulatory Visit (INDEPENDENT_AMBULATORY_CARE_PROVIDER_SITE_OTHER)
Admission: RE | Admit: 2018-03-17 | Discharge: 2018-03-17 | Disposition: A | Discharge status: Home / Self Care | Payer: Medicare Other | Source: Ambulatory Visit | Attending: Internal Medicine | Admitting: Internal Medicine

## 2018-03-17 DIAGNOSIS — R1084 Generalized abdominal pain: Secondary | ICD-10-CM

## 2018-03-17 DIAGNOSIS — R05 Cough: Secondary | ICD-10-CM | POA: Diagnosis not present

## 2018-03-17 DIAGNOSIS — F17210 Nicotine dependence, cigarettes, uncomplicated: Secondary | ICD-10-CM | POA: Diagnosis not present

## 2018-03-17 DIAGNOSIS — K760 Fatty (change of) liver, not elsewhere classified: Secondary | ICD-10-CM | POA: Diagnosis not present

## 2018-03-17 DIAGNOSIS — J329 Chronic sinusitis, unspecified: Secondary | ICD-10-CM | POA: Diagnosis not present

## 2018-03-17 DIAGNOSIS — Z122 Encounter for screening for malignant neoplasm of respiratory organs: Secondary | ICD-10-CM

## 2018-03-17 DIAGNOSIS — R059 Cough, unspecified: Secondary | ICD-10-CM

## 2018-03-17 DIAGNOSIS — J320 Chronic maxillary sinusitis: Secondary | ICD-10-CM | POA: Diagnosis not present

## 2018-03-17 DIAGNOSIS — R058 Other specified cough: Secondary | ICD-10-CM

## 2018-03-17 MED ORDER — IOPAMIDOL (ISOVUE-300) INJECTION 61%
100.0000 mL | Freq: Once | INTRAVENOUS | Status: AC | PRN
  Administered 2018-03-17: 100 mL via INTRAVENOUS

## 2018-03-18 ENCOUNTER — Other Ambulatory Visit: Payer: Self-pay | Admitting: Internal Medicine

## 2018-03-18 DIAGNOSIS — R059 Cough, unspecified: Secondary | ICD-10-CM

## 2018-03-18 DIAGNOSIS — R05 Cough: Secondary | ICD-10-CM

## 2018-03-18 NOTE — Progress Notes (Signed)
Was able to talk to patient regarding patient's results.  They verbalized an understanding of what was discussed. No further questions at this time.  Patient was only able to give me a phone number to a practice, after googling looks like the phone number is to Dr. Victorio Palm office so referral order placed with note to see Dr. Wilburn Cornelia.   Nothing further needed

## 2018-03-19 NOTE — Progress Notes (Signed)
ATC, NA and no option to leave msg b/c mailbox was full, [facility]

## 2018-03-22 ENCOUNTER — Telehealth: Payer: Self-pay | Admitting: Internal Medicine

## 2018-03-22 NOTE — Telephone Encounter (Signed)
Certified dismissal letter returned as undeliverable, unclaimed, return to sender after three attempts by [employer] on March 22, 2018. Letter placed in another envelope and resent as 1st class mail which does not require a signature. daj

## 2018-03-22 NOTE — Progress Notes (Signed)
LMTCB

## 2018-03-22 NOTE — Telephone Encounter (Signed)
Called and spoke to patient. Gave results per MW and scheduled follow up appointment. Nothing further is needed at this time.   Notes recorded by Rosana Berger, CMA on 03/22/2018 at 3:30 PM EDT LMTCB ------  Notes recorded by Rosana Berger, CMA on 03/19/2018 at 10:16 AM EDT ATC, NA and no option to leave msg b/c mailbox was full, W[facility] ------  Notes recorded by Tanda Rockers, MD on 03/18/2018 at 12:15 PM EDT Call patient : Study is Neg for lung ca - there are some problems we knew about related to calcium in her arteries but she is already under the care of cards and can talk to them about the findings at her next routine follow up but nothing to do differently in the meantime

## 2018-03-23 DIAGNOSIS — R05 Cough: Secondary | ICD-10-CM | POA: Diagnosis not present

## 2018-03-23 DIAGNOSIS — Z6841 Body Mass Index (BMI) 40.0 and over, adult: Secondary | ICD-10-CM | POA: Diagnosis not present

## 2018-03-23 DIAGNOSIS — J3489 Other specified disorders of nose and nasal sinuses: Secondary | ICD-10-CM | POA: Diagnosis not present

## 2018-03-31 ENCOUNTER — Encounter: Payer: Self-pay | Admitting: Family Medicine

## 2018-03-31 ENCOUNTER — Ambulatory Visit (INDEPENDENT_AMBULATORY_CARE_PROVIDER_SITE_OTHER): Payer: Medicare Other | Admitting: Family Medicine

## 2018-03-31 VITALS — BP 142/80 | HR 74 | Ht 63.5 in | Wt 245.2 lb

## 2018-03-31 DIAGNOSIS — G4733 Obstructive sleep apnea (adult) (pediatric): Secondary | ICD-10-CM | POA: Diagnosis not present

## 2018-03-31 DIAGNOSIS — I5032 Chronic diastolic (congestive) heart failure: Secondary | ICD-10-CM

## 2018-03-31 DIAGNOSIS — E1159 Type 2 diabetes mellitus with other circulatory complications: Secondary | ICD-10-CM

## 2018-03-31 DIAGNOSIS — I471 Supraventricular tachycardia: Secondary | ICD-10-CM | POA: Diagnosis not present

## 2018-03-31 DIAGNOSIS — E1165 Type 2 diabetes mellitus with hyperglycemia: Secondary | ICD-10-CM | POA: Diagnosis not present

## 2018-03-31 DIAGNOSIS — F17211 Nicotine dependence, cigarettes, in remission: Secondary | ICD-10-CM | POA: Insufficient documentation

## 2018-03-31 DIAGNOSIS — J449 Chronic obstructive pulmonary disease, unspecified: Secondary | ICD-10-CM | POA: Diagnosis not present

## 2018-03-31 DIAGNOSIS — E782 Mixed hyperlipidemia: Secondary | ICD-10-CM

## 2018-03-31 DIAGNOSIS — I110 Hypertensive heart disease with heart failure: Secondary | ICD-10-CM

## 2018-03-31 DIAGNOSIS — F17210 Nicotine dependence, cigarettes, uncomplicated: Secondary | ICD-10-CM | POA: Diagnosis not present

## 2018-03-31 LAB — BASIC METABOLIC PANEL
BUN: 16 mg/dL (ref 6–23)
CO2: 30 mEq/L (ref 19–32)
Calcium: 9.1 mg/dL (ref 8.4–10.5)
Chloride: 97 mEq/L (ref 96–112)
Creatinine, Ser: 1.16 mg/dL (ref 0.40–1.20)
GFR: 50.49 mL/min — ABNORMAL LOW (ref >60.00)
Glucose, Bld: 267 mg/dL — ABNORMAL HIGH (ref 70–99)
Potassium: 4.4 mEq/L (ref 3.5–5.1)
Sodium: 135 mEq/L (ref 135–145)

## 2018-03-31 LAB — MICROALBUMIN / CREATININE URINE RATIO
Creatinine,U: 148.1 mg/dL
Microalb Creat Ratio: 100.4 mg/g — ABNORMAL HIGH (ref 0.0–30.0)
Microalb, Ur: 148.7 mg/dL — ABNORMAL HIGH (ref 0.0–1.9)

## 2018-03-31 LAB — LIPID PANEL
Cholesterol: 291 mg/dL — ABNORMAL HIGH (ref 0–200)
HDL: 43.3 mg/dL (ref >39.00)
NonHDL: 247.38
Total CHOL/HDL Ratio: 7
Triglycerides: 323 mg/dL — ABNORMAL HIGH (ref 0.0–149.0)
VLDL: 64.6 mg/dL — ABNORMAL HIGH (ref 0.0–40.0)

## 2018-03-31 LAB — HEMOGLOBIN A1C: Hgb A1c MFr Bld: 10.6 % — ABNORMAL HIGH (ref 4.6–6.5)

## 2018-03-31 LAB — LDL CHOLESTEROL, DIRECT: Direct LDL: 200 mg/dL

## 2018-03-31 MED ORDER — INSULIN ASPART 100 UNIT/ML FLEXPEN: 30.0000 [IU] | PEN_INJECTOR | Freq: Three times a day (TID) | SUBCUTANEOUS | 5 refills | Status: DC

## 2018-03-31 MED ORDER — FUROSEMIDE 40 MG PO TABS: 40.0000 mg | ORAL_TABLET | Freq: Every day | ORAL | 1 refills | Status: DC

## 2018-03-31 MED ORDER — INSULIN GLARGINE 300 UNIT/ML ~~LOC~~ SOPN: 110.0000 [IU] | PEN_INJECTOR | Freq: Two times a day (BID) | SUBCUTANEOUS | 3 refills | Status: DC

## 2018-03-31 MED ORDER — ONETOUCH VERIO FLEX SYSTEM W/DEVICE KIT: 1.0000 "application " | PACK | Freq: Four times a day (QID) | 0 refills | Status: DC

## 2018-03-31 NOTE — Patient Instructions (Signed)
Start toujeo in place of lantus at 110 units twice per day Continue novolog at current dose   Diabetes Mellitus and Nutrition When you have diabetes (diabetes mellitus), it is very important to have healthy eating habits because your blood sugar (glucose) levels are greatly affected by what you eat and drink. Eating healthy foods in the appropriate amounts, at about the same times every day, can help you:  Control your blood glucose.  Lower your risk of heart disease.  Improve your blood pressure.  Reach or maintain a healthy weight.  Every person with diabetes is different, and each person has different needs for a meal plan. Your health care provider may recommend that you work with a diet and nutrition specialist (dietitian) to make a meal plan that is best for you. Your meal plan may vary depending on factors such as:  The calories you need.  The medicines you take.  Your weight.  Your blood glucose, blood pressure, and cholesterol levels.  Your activity level.  Other health conditions you have, such as heart or kidney disease.  How do carbohydrates affect me? Carbohydrates affect your blood glucose level more than any other type of food. Eating carbohydrates naturally increases the amount of glucose in your blood. Carbohydrate counting is a method for keeping track of how many carbohydrates you eat. Counting carbohydrates is important to keep your blood glucose at a healthy level, especially if you use insulin or take certain oral diabetes medicines. It is important to know how many carbohydrates you can safely have in each meal. This is different for every person. Your dietitian can help you calculate how many carbohydrates you should have at each meal and for snack. Foods that contain carbohydrates include:  Bread, cereal, rice, pasta, and crackers.  Potatoes and corn.  Peas, beans, and lentils.  Milk and yogurt.  Fruit and juice.  Desserts, such as cakes, cookies,  ice cream, and candy.  How does alcohol affect me? Alcohol can cause a sudden decrease in blood glucose (hypoglycemia), especially if you use insulin or take certain oral diabetes medicines. Hypoglycemia can be a life-threatening condition. Symptoms of hypoglycemia (sleepiness, dizziness, and confusion) are similar to symptoms of having too much alcohol. If your health care provider says that alcohol is safe for you, follow these guidelines:  Limit alcohol intake to no more than 1 drink per day for nonpregnant women and 2 drinks per day for men. One drink equals 12 oz of beer, 5 oz of wine, or 1 oz of hard liquor.  Do not drink on an empty stomach.  Keep yourself hydrated with water, diet soda, or unsweetened iced tea.  Keep in mind that regular soda, juice, and other mixers may contain a lot of sugar and must be counted as carbohydrates.  What are tips for following this plan? Reading food labels  Start by checking the serving size on the label. The amount of calories, carbohydrates, fats, and other nutrients listed on the label are based on one serving of the food. Many foods contain more than one serving per package.  Check the total grams (g) of carbohydrates in one serving. You can calculate the number of servings of carbohydrates in one serving by dividing the total carbohydrates by 15. For example, if a food has 30 g of total carbohydrates, it would be equal to 2 servings of carbohydrates.  Check the number of grams (g) of saturated and trans fats in one serving. Choose foods that have low or  no amount of these fats.  Check the number of milligrams (mg) of sodium in one serving. Most people should limit total sodium intake to less than 2,300 mg per day.  Always check the nutrition information of foods labeled as "low-fat" or "nonfat". These foods may be higher in added sugar or refined carbohydrates and should be avoided.  Talk to your dietitian to identify your daily goals for  nutrients listed on the label. Shopping  Avoid buying canned, premade, or processed foods. These foods tend to be high in fat, sodium, and added sugar.  Shop around the outside edge of the grocery store. This includes fresh fruits and vegetables, bulk grains, fresh meats, and fresh dairy. Cooking  Use low-heat cooking methods, such as baking, instead of high-heat cooking methods like deep frying.  Cook using healthy oils, such as olive, canola, or sunflower oil.  Avoid cooking with butter, cream, or high-fat meats. Meal planning  Eat meals and snacks regularly, preferably at the same times every day. Avoid going long periods of time without eating.  Eat foods high in fiber, such as fresh fruits, vegetables, beans, and whole grains. Talk to your dietitian about how many servings of carbohydrates you can eat at each meal.  Eat 4-6 ounces of lean protein each day, such as lean meat, chicken, fish, eggs, or tofu. 1 ounce is equal to 1 ounce of meat, chicken, or fish, 1 egg, or 1/4 cup of tofu.  Eat some foods each day that contain healthy fats, such as avocado, nuts, seeds, and fish. Lifestyle   Check your blood glucose regularly.  Exercise at least 30 minutes 5 or more days each week, or as told by your health care provider.  Take medicines as told by your health care provider.  Do not use any products that contain nicotine or tobacco, such as cigarettes and e-cigarettes. If you need help quitting, ask your health care provider.  Work with a Social worker or diabetes educator to identify strategies to manage stress and any emotional and social challenges. What are some questions to ask my health care provider?  Do I need to meet with a diabetes educator?  Do I need to meet with a dietitian?  What number can I call if I have questions?  When are the best times to check my blood glucose? Where to find more information:  American Diabetes Association:  diabetes.org/food-and-fitness/food  Academy of Nutrition and Dietetics: PokerClues.dk  [employer] of Diabetes and Digestive and Kidney Diseases (NIH): ContactWire.be Summary  A healthy meal plan will help you control your blood glucose and maintain a healthy lifestyle.  Working with a diet and nutrition specialist (dietitian) can help you make a meal plan that is best for you.  Keep in mind that carbohydrates and alcohol have immediate effects on your blood glucose levels. It is important to count carbohydrates and to use alcohol carefully. This information is not intended to replace advice given to you by your health care provider. Make sure you discuss any questions you have with your health care provider. Document Released: 08/21/2005 Document Revised: 12/29/2016 Document Reviewed: 12/29/2016 Elsevier Interactive Patient Education  [employer].

## 2018-03-31 NOTE — Progress Notes (Signed)
[name] - 62 y.o. female MRN [id number]  Date of birth: 17-Aug-1956  Subjective Chief Complaint  Patient presents with  . New Patient (Initial Visit)    needs medication refills    HPI [name] is a 62 y.o. female who is well known to me from my previous practice here today to establish care and follow up of chronic medical conditions listed below.  -HTN: [name] has a history of hypertension as well as chronic diastolic CHF and paroxysmal SVT.  She is currently treated with isosorbide mononitrate, verapamil and furosemide.  She tells me that she is compliant with these medications.  She is not compliant with low-sodium diet or exercise.  She does have some intermittent edema in her lower extremities however she denies chest pain, increased shortness of breath/exertional dyspnea, headaches, vision changes.  She typically has episodes of SVT after using her rescue inhaler.  -T2DM: Type 2 diabetes that has been poorly controlled.  She was referred to endocrinology but was dismissed due to missed appointments.  She has been out of her insulin for several weeks.  She does not monitor her blood sugars regularly.  She has been poorly compliant with dietary changes as well as exercise.  She has been trying to work on cutting back on sweet tea and drinking more water.  -COPD: COPD with continued nicotine use.  She has been followed by pulmonology and had a recent CT scan that showed no signs of lung cancer.  She is not always compliant with her Ruthe Mannan and tries to avoid using her rescue inhaler due to SVT.  She does have a chronic cough and uses Robinul for this at times.  Recently she continues to smoke and does not wish to quit at this time.  She has worked on cutting back to 1/2 pack/day.  -OSA: Has CPAP and tells me that she uses this most of the time.  Denies increased fatigue or daytime sleepiness.  -HLD:  Treated with crestor, tolerating well.  Denies myalgias.  Has history of  CVA as well as increased vascular calcifications noted on recent CT scan.   ROS: ROS per HPI, otherwise negative.   Allergies  Allergen Reactions  . Atorvastatin Other (See Comments) and Nausea And Vomiting    "legs cramped; moderate to almost severe"  . Ibuprofen Other (See Comments)    Other reaction(s): Other (See Comments) Per pt, MD doesn't want pt to take Per pt, MD doesn't want pt to take  . Metformin And Related Nausea And Vomiting  . Ace Inhibitors Cough and Nausea And Vomiting  . Hydrocodone-Acetaminophen Nausea And Vomiting  . Albuterol Other (See Comments)    Caused SVT    Past Medical History:  Diagnosis Date  . Anxiety    doesn't take any meds for this  . Arteriosclerosis of coronary artery 08/31/2011   Myoview 9/12: EF 44, no ischemia // Myoview 8/18: EF 57, low risk, possible small area of inferior ischemia // Minor nonobstructive CAD by cardiac catheterization - LHC 8/18: Proximal LAD 35, OM2 25  . Arthritis    knuckles  . Asthma   . Carotid artery disease ([place])    Carotid US 7/14: Bilateral ICA 40-59 // Carotid US ([facility]) 2/17: bilat plaque without sig stenosis  . Chronic diastolic CHF (congestive heart failure) ([place]) 07/26/2017   Echo 7/18:Severe conc LVH, EF 60-65, no RWMA, Gr 2 DD, MAC, mild MR // right and left heart cath 8/18: LVEDP 21  . Claustrophobia 07/26/2012  .  COPD (chronic obstructive pulmonary disease) ([place])    "chronic bronchitis - about every winter"  . CSF leak    dizziness/headache - assoc symptoms // 2/2 encephalocele s/p skull base reconstruction  . Diverticulosis   . History of CVA (cerebrovascular accident) 03/2004   denies residual 07/26/2012  . Hx of gout   . Hyperlipidemia    takes Pravastatin daily  . Hypertensive heart disease with chronic diastolic congestive heart failure ([place]) 07/26/2017  . Internal hemorrhoids   . Kidney stones   . LVH (left ventricular hypertrophy)    Echo 8/18: Severe conc LVH, EF 60-65, no RWMA, Gr 2 DD,  MAC, mild MR  . OSA (obstructive sleep apnea) 07/26/2012   prior CPAP - stopped due to being primary caregiver for dying parent (stopped in 2016)  . PSVT (paroxysmal supraventricular tachycardia) ([place])   . Pulmonary hypertension, unspecified ([place]) 08/14/2017   Right heart cath 8/13:RVSP 66, PASP 57, mean PA 39, mean PWCP 18, LVEDP 21  . Type II diabetes mellitus ([place])     Past Surgical History:  Procedure Laterality Date  . ESOPHAGOGASTRODUODENOSCOPY (EGD) WITH PROPOFOL N/A 07/13/2015   Procedure: ESOPHAGOGASTRODUODENOSCOPY (EGD) WITH PROPOFOL;  Surgeon: Carol Ada, MD;  Location: [facility];  Service: Endoscopy;  Laterality: N/A;  . NASAL SINUS SURGERY  01/29/2012   Procedure: ENDOSCOPIC SINUS SURGERY WITH STEALTH;  Surgeon: Ruby Cola, MD;  Location: [facility];  Service: ENT;  Laterality: N/A;  . NM MYOVIEW LTD  08/25/2011   Low risk study, no evidence of ischemia, EF 44%  . REFRACTIVE SURGERY  ~ 2010   left  . RIGHT/LEFT HEART CATH AND CORONARY ANGIOGRAPHY N/A 07/30/2017   Procedure: RIGHT/LEFT HEART CATH AND CORONARY ANGIOGRAPHY;  Surgeon: Martinique, Peter M, MD;  Location: [facility];  Service: Cardiovascular;  Laterality: N/A;  . SPHENOIDECTOMY  01/29/2012   Procedure: SPHENOIDECTOMY;  Surgeon: Ruby Cola, MD;  Location: [facility];  Service: ENT;  Laterality: Left;  REPAIR OF LEFT SPHENOID    . TOTAL ABDOMINAL HYSTERECTOMY  1994    Social History   Socioeconomic History  . Marital status: Single    Spouse name: Not on file  . Number of children: 0  . Years of education: Not on file  . Highest education level: Not on file  Occupational History  . Occupation: disabled  Social Needs  . Financial resource strain: Not on file  . Food insecurity:    Worry: Not on file    Inability: Not on file  . Transportation needs:    Medical: Not on file    Non-medical: Not on file  Tobacco Use  . Smoking status: Current Every Day Smoker    Packs/day: 0.50    Years: 35.00    Pack  years: 17.50    Types: Cigarettes  . Smokeless tobacco: Never Used  . Tobacco comment: 07/26/2012 has decreased smoking from 2ppd; offered cessation materials; pt declines  Substance and Sexual Activity  . Alcohol use: Yes    Alcohol/week: 0.6 oz    Types: 1 Glasses of wine per week    Comment: rarely  . Drug use: No  . Sexual activity: Never    Birth control/protection: Surgical  Lifestyle  . Physical activity:    Days per week: Not on file    Minutes per session: Not on file  . Stress: Not on file  Relationships  . Social connections:    Talks on phone: Not on file    Gets together: Not  on file    Attends religious service: Not on file    Active member of club or organization: Not on file    Attends meetings of clubs or organizations: Not on file    Relationship status: Not on file  Other Topics Concern  . Not on file  Social History Narrative   Currently unemployed.  Really wants to find a job, but has been difficult for her.  No children, not married    Family History  Problem Relation Age of Onset  . Colon cancer Sister 61  . Breast cancer Mother 71  . Alzheimer's disease Mother   . Diabetes Sister 37  . Heart failure Sister   . Diabetes Sister 39  . Lung cancer Father   . Breast cancer Sister   . Cancer Sister        mouth  . Anesthesia problems Neg Hx   . Hypotension Neg Hx   . Malignant hyperthermia Neg Hx   . Pseudochol deficiency Neg Hx     Health Maintenance  Topic Date Due  . Hepatitis C Screening  1956-03-04  . PAP SMEAR  04/26/1978  . OPHTHALMOLOGY EXAM  03/04/2014  . HEMOGLOBIN A1C  12/20/2014  . FOOT EXAM  06/20/2015  . HIV Screening  04/01/2019 (Originally 04/26/1972)  . MAMMOGRAM  06/25/2018  . INFLUENZA VACCINE  07/08/2018  . COLONOSCOPY  10/09/2019  . PNEUMOCOCCAL POLYSACCHARIDE VACCINE (2) 01/09/2021  . TETANUS/TDAP  03/29/2025     ----------------------------------------------------------------------------------------------------------------------------------------------------- Physical Exam BP (!) 142/80 (BP Location: Right Arm, Patient Position: Sitting, Cuff Size: Large)   Pulse 74   Ht 5' 3.5" (1.613 m)   Wt 245 lb 3.2 oz (111.2 kg)   BMI 42.75 kg/m   Physical Exam  Constitutional: She is oriented to person, place, and time. No distress.  Obese   HENT:  Head: Normocephalic and atraumatic.  Mouth/Throat: No oropharyngeal exudate.  Neck: Neck supple. No thyromegaly present.  Cardiovascular: Normal rate, regular rhythm and intact distal pulses.  Murmur (2/6 systolic murmur) heard. Pulmonary/Chest: Effort normal and breath sounds normal. No respiratory distress.  Musculoskeletal: She exhibits edema (trace).  Lymphadenopathy:    She has no cervical adenopathy.  Neurological: She is alert and oriented to person, place, and time.  Skin: Skin is warm and dry.  Psychiatric: She has a normal mood and affect. Her behavior is normal.    ----------------------------------------------------------------------------------------------------------------------------------------------------- Assessment and Plan  Hypertensive heart disease with chronic diastolic congestive heart failure ([place]) Blood pressure mildly elevated today. Stressed importance of taking medications as directed, follow with sodium diet and avoiding nicotine products. I encouraged her to begin exercising, start with walking. She will continue to follow with her cardiologist as well.  Chronic diastolic CHF (congestive heart failure) ([facility]) Some mild edema but denies any increased pulmonary symptoms. She does have a mild murmur on exam today.  Echo from 06/2017 shows mild mitral regurg. Continue Lasix as needed.  Paroxysmal SVT (supraventricular tachycardia) ([facility]) Reviewed home vagal maneuvers.  Discussed that if not improving with vagal  maneuvers she should be seen in the hospital.  Poorly controlled type 2 diabetes mellitus with circulatory disorder ([place]) Her diabetes has been poorly controlled. I will update her A1c today and refill her insulin. We will switch her Lantus to Toujeo, 110 units twice daily. Continue NovoLog at current dosing. Strongly encouraged her to work on dietary changes and exercise.  COPD  GOLD 0 still smoking She still is not using Dulera consistently and avoids using her  rescue inhaler due to SVT. Encouraged to use Dulera consistently Stressed importance of quitting smoking. She will continue to follow with pulmonology.  OSA (obstructive sleep apnea) Encouraged to continue CPAP.  HLD (hyperlipidemia) Continue statin, tolerating well. Update lipid panel today.  Nicotine dependence, cigarettes, uncomplicated Approximately 10 minutes was spent counseling on quitting smoking, encouraged to set a quit date. Declines medications at this time, has not had success with anything in the past.

## 2018-03-31 NOTE — Assessment & Plan Note (Signed)
She still is not using Dulera consistently and avoids using her rescue inhaler due to SVT. Encouraged to use Dulera consistently Stressed importance of quitting smoking. She will continue to follow with pulmonology.

## 2018-03-31 NOTE — Assessment & Plan Note (Signed)
Blood pressure mildly elevated today. Stressed importance of taking medications as directed, follow with sodium diet and avoiding nicotine products. I encouraged her to begin exercising, start with walking. She will continue to follow with her cardiologist as well.

## 2018-03-31 NOTE — Assessment & Plan Note (Addendum)
Her diabetes has been poorly controlled. I will update her A1c today and refill her insulin. We will switch her Lantus to Toujeo, 110 units twice daily. Continue NovoLog at current dosing. Strongly encouraged her to work on dietary changes and exercise.

## 2018-03-31 NOTE — Assessment & Plan Note (Signed)
Some mild edema but denies any increased pulmonary symptoms. She does have a mild murmur on exam today.  Echo from 06/2017 shows mild mitral regurg. Continue Lasix as needed.

## 2018-03-31 NOTE — Assessment & Plan Note (Signed)
Reviewed home vagal maneuvers.  Discussed that if not improving with vagal maneuvers she should be seen in the hospital.

## 2018-03-31 NOTE — Assessment & Plan Note (Signed)
Encouraged to continue CPAP.

## 2018-03-31 NOTE — Assessment & Plan Note (Signed)
Approximately 10 minutes was spent counseling on quitting smoking, encouraged to set a quit date. Declines medications at this time, has not had success with anything in the past.

## 2018-03-31 NOTE — Assessment & Plan Note (Signed)
Continue statin, tolerating well. Update lipid panel today.

## 2018-04-01 ENCOUNTER — Telehealth: Payer: Self-pay | Admitting: *Deleted

## 2018-04-01 MED ORDER — PREGABALIN 75 MG PO CAPS: 75.0000 mg | ORAL_CAPSULE | Freq: Two times a day (BID) | ORAL | 2 refills | Status: DC

## 2018-04-01 NOTE — Telephone Encounter (Signed)
Refill request for lyrica. Dr. Jacqualyn Posey states refill for 3 months, pt needs to be evaluated prior to future refills.

## 2018-04-07 ENCOUNTER — Encounter: Payer: Self-pay | Admitting: Emergency Medicine

## 2018-04-09 ENCOUNTER — Ambulatory Visit: Payer: Medicare Other | Admitting: Internal Medicine

## 2018-04-13 ENCOUNTER — Other Ambulatory Visit: Payer: Self-pay | Admitting: Physician Assistant

## 2018-04-16 ENCOUNTER — Telehealth: Payer: Self-pay | Admitting: Family Medicine

## 2018-04-16 NOTE — Telephone Encounter (Addendum)
Pt returned call for lab results. Message given to her from her pcp. Pt states that she has not been taking the Crestor but will start back taking it. She also stated that she had been on losartan but is now on Imdur 60 MG. She will restart her diabetes medication. She will change her diet, decrease red meat, and fats. Start walking. Limit sweets and high carb foods.  Pt wanted to know what she can take for low back pain whenever she has to walk. She had been taking Tylenol which did not help. Advised to talk with her provider about this. She would like a call back please.  She also stated that she did have an episode of SVT last night but no symptoms today. She did not call the EMS. Pt advised to call back with any worsening symptoms.

## 2018-04-16 NOTE — Telephone Encounter (Signed)
Please advise 

## 2018-04-19 NOTE — Telephone Encounter (Signed)
Attempted to contact patient. Pt VM is full and cannot accept any messages. Will try back later. CRM created.

## 2018-04-19 NOTE — Telephone Encounter (Signed)
Would recommend adding back on losartan,  especially since her BP was elevated at last appt.   As far as back pain tylenol is going to be safest for her (1000mg  every 6-8 hours as needed).  May also use muscle rub such as biofreeze/bengay/icyhot.

## 2018-04-21 ENCOUNTER — Encounter: Payer: Self-pay | Admitting: Emergency Medicine

## 2018-04-21 NOTE — Telephone Encounter (Signed)
VM is full. Sending out an unable to reach letter to patient listed address.

## 2018-04-29 ENCOUNTER — Telehealth: Payer: Self-pay | Admitting: Family Medicine

## 2018-04-29 ENCOUNTER — Other Ambulatory Visit: Payer: Self-pay | Admitting: Family Medicine

## 2018-04-29 MED ORDER — ROSUVASTATIN CALCIUM 20 MG PO TABS
20.0000 mg | ORAL_TABLET | Freq: Every day | ORAL | 2 refills | Status: DC
Start: 2018-04-29 — End: 2019-07-04

## 2018-04-29 NOTE — Telephone Encounter (Signed)
Copied from [place] [phone]. Topic: Quick Communication - Rx Refill/Question >> Apr 29, 2018 11:05 AM Margot Ables wrote: Medication: CRESTOR 20 MG tablet generic - pt is out - no refills available - would like 90 day supply - not ordered by Dr. Zigmund Daniel previously Has the patient contacted their pharmacy? Yes no refills/new provider Preferred Pharmacy (with phone number or street name): [id number] - [place], [place] [phone] (Phone) [phone] (Fax)

## 2018-04-29 NOTE — Telephone Encounter (Signed)
Refill completed.

## 2018-05-04 ENCOUNTER — Ambulatory Visit: Payer: Medicare Other | Admitting: Podiatry

## 2018-05-12 ENCOUNTER — Encounter (HOSPITAL_COMMUNITY): Payer: Self-pay | Admitting: Emergency Medicine

## 2018-05-12 ENCOUNTER — Emergency Department (HOSPITAL_COMMUNITY)
Admission: EM | Admit: 2018-05-12 | Discharge: 2018-05-12 | Disposition: A | Discharge status: Home / Self Care | Payer: Medicare Other | Attending: Emergency Medicine | Admitting: Emergency Medicine

## 2018-05-12 ENCOUNTER — Emergency Department (HOSPITAL_COMMUNITY): Payer: Medicare Other

## 2018-05-12 ENCOUNTER — Other Ambulatory Visit: Payer: Self-pay

## 2018-05-12 DIAGNOSIS — E119 Type 2 diabetes mellitus without complications: Secondary | ICD-10-CM | POA: Insufficient documentation

## 2018-05-12 DIAGNOSIS — Z79899 Other long term (current) drug therapy: Secondary | ICD-10-CM | POA: Diagnosis not present

## 2018-05-12 DIAGNOSIS — I5032 Chronic diastolic (congestive) heart failure: Secondary | ICD-10-CM | POA: Insufficient documentation

## 2018-05-12 DIAGNOSIS — I2510 Atherosclerotic heart disease of native coronary artery without angina pectoris: Secondary | ICD-10-CM | POA: Insufficient documentation

## 2018-05-12 DIAGNOSIS — Z7982 Long term (current) use of aspirin: Secondary | ICD-10-CM | POA: Diagnosis not present

## 2018-05-12 DIAGNOSIS — R1031 Right lower quadrant pain: Secondary | ICD-10-CM | POA: Insufficient documentation

## 2018-05-12 DIAGNOSIS — J449 Chronic obstructive pulmonary disease, unspecified: Secondary | ICD-10-CM | POA: Diagnosis not present

## 2018-05-12 DIAGNOSIS — R1033 Periumbilical pain: Secondary | ICD-10-CM | POA: Diagnosis not present

## 2018-05-12 DIAGNOSIS — R109 Unspecified abdominal pain: Secondary | ICD-10-CM

## 2018-05-12 DIAGNOSIS — I110 Hypertensive heart disease with heart failure: Secondary | ICD-10-CM | POA: Diagnosis not present

## 2018-05-12 DIAGNOSIS — Z794 Long term (current) use of insulin: Secondary | ICD-10-CM | POA: Diagnosis not present

## 2018-05-12 DIAGNOSIS — R053 Chronic cough: Secondary | ICD-10-CM

## 2018-05-12 DIAGNOSIS — F17210 Nicotine dependence, cigarettes, uncomplicated: Secondary | ICD-10-CM | POA: Insufficient documentation

## 2018-05-12 DIAGNOSIS — R05 Cough: Secondary | ICD-10-CM | POA: Diagnosis not present

## 2018-05-12 DIAGNOSIS — K760 Fatty (change of) liver, not elsewhere classified: Secondary | ICD-10-CM | POA: Diagnosis not present

## 2018-05-12 DIAGNOSIS — R1011 Right upper quadrant pain: Secondary | ICD-10-CM | POA: Diagnosis not present

## 2018-05-12 LAB — COMPREHENSIVE METABOLIC PANEL
ALT: 14 U/L (ref 14–54)
AST: 19 U/L (ref 15–41)
Albumin: 4 g/dL (ref 3.5–5.0)
Alkaline Phosphatase: 86 U/L (ref 38–126)
Anion gap: 7 (ref 5–15)
BUN: 24 mg/dL — ABNORMAL HIGH (ref 6–20)
CO2: 31 mmol/L (ref 22–32)
Calcium: 9.1 mg/dL (ref 8.9–10.3)
Chloride: 100 mmol/L — ABNORMAL LOW (ref 101–111)
Creatinine, Ser: 1.46 mg/dL — ABNORMAL HIGH (ref 0.44–1.00)
GFR calc Af Amer: 44 mL/min — ABNORMAL LOW (ref >60)
GFR calc non Af Amer: 38 mL/min — ABNORMAL LOW (ref >60)
Glucose, Bld: 300 mg/dL — ABNORMAL HIGH (ref 65–99)
Potassium: 4 mmol/L (ref 3.5–5.1)
Sodium: 138 mmol/L (ref 135–145)
Total Bilirubin: 0.8 mg/dL (ref 0.3–1.2)
Total Protein: 7.7 g/dL (ref 6.5–8.1)

## 2018-05-12 LAB — URINALYSIS, ROUTINE W REFLEX MICROSCOPIC
Bilirubin Urine: NEGATIVE
Glucose, UA: >=500 mg/dL — AB
Hgb urine dipstick: NEGATIVE
Ketones, ur: NEGATIVE mg/dL
Leukocytes, UA: NEGATIVE
Nitrite: NEGATIVE
Protein, ur: >=300 mg/dL — AB
Specific Gravity, Urine: 1.017 (ref 1.005–1.030)
pH: 5 (ref 5.0–8.0)

## 2018-05-12 LAB — CBC WITH DIFFERENTIAL/PLATELET
Basophils Absolute: 0 10*3/uL (ref 0.0–0.1)
Basophils Relative: 0 %
Eosinophils Absolute: 0.3 10*3/uL (ref 0.0–0.7)
Eosinophils Relative: 3 %
HCT: 47.6 % — ABNORMAL HIGH (ref 36.0–46.0)
Hemoglobin: 15.8 g/dL — ABNORMAL HIGH (ref 12.0–15.0)
Lymphocytes Relative: 17 %
Lymphs Abs: 1.7 10*3/uL (ref 0.7–4.0)
MCH: 30.4 pg (ref 26.0–34.0)
MCHC: 33.2 g/dL (ref 30.0–36.0)
MCV: 91.5 fL (ref 78.0–100.0)
Monocytes Absolute: 0.7 10*3/uL (ref 0.1–1.0)
Monocytes Relative: 7 %
Neutro Abs: 7.6 10*3/uL (ref 1.7–7.7)
Neutrophils Relative %: 73 %
Platelets: 196 10*3/uL (ref 150–400)
RBC: 5.2 MIL/uL — ABNORMAL HIGH (ref 3.87–5.11)
RDW: 14.6 % (ref 11.5–15.5)
WBC: 10.2 10*3/uL (ref 4.0–10.5)

## 2018-05-12 LAB — LIPASE, BLOOD: Lipase: 54 U/L — ABNORMAL HIGH (ref 11–51)

## 2018-05-12 MED ORDER — SODIUM CHLORIDE 0.9 % IV BOLUS
250.0000 mL | Freq: Once | INTRAVENOUS | Status: AC
  Administered 2018-05-12: 250 mL via INTRAVENOUS

## 2018-05-12 MED ORDER — ONDANSETRON HCL 4 MG/2ML IJ SOLN
4.0000 mg | Freq: Once | INTRAMUSCULAR | Status: AC
  Administered 2018-05-12: 4 mg via INTRAVENOUS
  Filled 2018-05-12: qty 2

## 2018-05-12 MED ORDER — MORPHINE SULFATE (PF) 4 MG/ML IV SOLN
4.0000 mg | Freq: Once | INTRAVENOUS | Status: AC
  Administered 2018-05-12: 4 mg via INTRAVENOUS
  Filled 2018-05-12: qty 1

## 2018-05-12 MED ORDER — OXYCODONE HCL 5 MG PO TABS
10.0000 mg | ORAL_TABLET | Freq: Once | ORAL | Status: AC
  Administered 2018-05-12: 10 mg via ORAL
  Filled 2018-05-12: qty 2

## 2018-05-12 NOTE — Discharge Instructions (Addendum)
1. Medications: usual home medications °2. Treatment: rest, drink plenty of fluids, advance diet slowly °3. Follow Up: Please followup with your primary doctor in 2 days for discussion of your diagnoses and further evaluation after today's visit; if you do not have a primary care doctor use the resource guide provided to find one; Please return to the ER for persistent vomiting, high fevers or worsening symptoms °

## 2018-05-12 NOTE — ED Triage Notes (Signed)
Pt c/o RLQ abd pain onset 3 days ago that has progressively worsened and is unbearable tonight. Last BM tonight was normal , denies dysuria, but admits to nausea w/o emesis

## 2018-05-12 NOTE — ED Provider Notes (Signed)
[facility] DEPT Provider Note   CSN: [id number] Arrival date & time: 05/12/18  0123     History   Chief Complaint Chief Complaint  Patient presents with  . Abdominal Pain    HPI [name] is a 62 y.o. female with a hx of morbid obesity, poorly controlled diabetes, previous kidney stone, sleep apnea, COPD, chronic diastolic congestive heart failure, hypertension, paroxysmal SVT, pulmonary hypertension presents to the Emergency Department complaining of gradual, persistent, progressively worsening right-sided abdominal pain onset 3 days ago with acute worsening in the last 24 hours.  Patient reports that tonight her pain became so intense that she could not stay at home.  She denies fever, chills, chest pain, shortness of breath, nausea, vomiting, diarrhea, constipation, weakness, dizziness, syncope, dysuria, vaginal discharge.  Patient surgical history includes an total abdominal hysterectomy.  She reports pain is significantly worse with coughing or palpation.  She reports she has had a chronic cough for many years but significantly worse in the last 6 months.  She denies fevers, chills or shortness of breath.  She denies any specific alleviating factors.  Review shows that patient was seen in April for similar symptoms.  At that time she had a CT scan which showed a prominent liver and spleen with hepatic steatosis and nonobstructing left kidney calculi.  There were no other acute findings at that time.  The history is provided by the patient and medical records. No language interpreter was used.    Past Medical History:  Diagnosis Date  . Anxiety    doesn't take any meds for this  . Arteriosclerosis of coronary artery 08/31/2011   Myoview 9/12: EF 44, no ischemia // Myoview 8/18: EF 57, low risk, possible small area of inferior ischemia // Minor nonobstructive CAD by cardiac catheterization - [facility] 8/18: Proximal LAD 35, OM2 25  . Arthritis    knuckles  . Asthma   . Carotid artery disease ([place])    Carotid US 7/14: Bilateral ICA 40-59 // Carotid US ([facility]) 2/17: bilat plaque without sig stenosis  . Chronic diastolic CHF (congestive heart failure) ([place]) 07/26/2017   Echo 7/18:Severe conc LVH, EF 60-65, no RWMA, Gr 2 DD, MAC, mild MR // right and left heart cath 8/18: LVEDP 21  . Claustrophobia 07/26/2012  . COPD (chronic obstructive pulmonary disease) ([place])    "chronic bronchitis - about every winter"  . CSF leak    dizziness/headache - assoc symptoms // 2/2 encephalocele s/p skull base reconstruction  . Diverticulosis   . History of CVA (cerebrovascular accident) 03/2004   denies residual 07/26/2012  . Hx of gout   . Hyperlipidemia    takes Pravastatin daily  . Hypertensive heart disease with chronic diastolic congestive heart failure ([place]) 07/26/2017  . Internal hemorrhoids   . Kidney stones   . LVH (left ventricular hypertrophy)    Echo 8/18: Severe conc LVH, EF 60-65, no RWMA, Gr 2 DD, MAC, mild MR  . OSA (obstructive sleep apnea) 07/26/2012   prior CPAP - stopped due to being primary caregiver for dying parent (stopped in 2016)  . PSVT (paroxysmal supraventricular tachycardia) ([place])   . Pulmonary hypertension, unspecified ([place]) 08/14/2017   Right heart cath 8/13:RVSP 66, PASP 57, mean PA 39, mean PWCP 18, LVEDP 21  . Type II diabetes mellitus [facility])     Patient Active Problem List   Diagnosis Date Noted  . Nicotine dependence, cigarettes, uncomplicated 72/53/6644  . Upper airway cough syndrome 08/31/2017  .  Pulmonary hypertension, unspecified ([place]) 08/14/2017  . Chronic diastolic CHF (congestive heart failure) ([place]) 07/26/2017  . Hypertensive heart disease with chronic diastolic congestive heart failure ([place]) 07/26/2017  . Paroxysmal SVT (supraventricular tachycardia) ([place]) 07/26/2017  . COPD  GOLD 0 still smoking 07/13/2017  . OSA (obstructive sleep apnea) 10/17/2014  . Pain in joint, multiple sites 09/21/2013  .  Carotid artery disease ([place]) 06/03/2013  . Dizziness 02/16/2013  . Depression 12/12/2012  . Weakness of left side of body 07/26/2012  . Uric acid kidney stone 07/04/2012  . CSF leak from nose 10/19/2011  . Arteriosclerosis of coronary artery 08/31/2011  . Poorly controlled type 2 diabetes mellitus with circulatory disorder ([place]) 08/31/2011  . HLD (hyperlipidemia) 07/02/2011  . Morbid obesity due to excess calories ([place]) 06/20/2011    Past Surgical History:  Procedure Laterality Date  . ESOPHAGOGASTRODUODENOSCOPY (EGD) WITH PROPOFOL N/A 07/13/2015   Procedure: ESOPHAGOGASTRODUODENOSCOPY (EGD) WITH PROPOFOL;  Surgeon: Carol Ada, MD;  Location: [facility];  Service: Endoscopy;  Laterality: N/A;  . NASAL SINUS SURGERY  01/29/2012   Procedure: ENDOSCOPIC SINUS SURGERY WITH STEALTH;  Surgeon: Ruby Cola, MD;  Location: [facility];  Service: ENT;  Laterality: N/A;  . NM MYOVIEW LTD  08/25/2011   Low risk study, no evidence of ischemia, EF 44%  . REFRACTIVE SURGERY  ~ 2010   left  . RIGHT/LEFT HEART CATH AND CORONARY ANGIOGRAPHY N/A 07/30/2017   Procedure: RIGHT/LEFT HEART CATH AND CORONARY ANGIOGRAPHY;  Surgeon: Martinique, Peter M, MD;  Location: [facility];  Service: Cardiovascular;  Laterality: N/A;  . SPHENOIDECTOMY  01/29/2012   Procedure: SPHENOIDECTOMY;  Surgeon: Ruby Cola, MD;  Location: [facility];  Service: ENT;  Laterality: Left;  REPAIR OF LEFT SPHENOID    . TOTAL ABDOMINAL HYSTERECTOMY  1994     OB History    Gravida  0   Para      Term      Preterm      AB      Living        SAB      TAB      Ectopic      Multiple      Live Births               Home Medications    Prior to Admission medications   Medication Sig Start Date End Date Taking? Authorizing Provider  acetaminophen (TYLENOL) 325 MG tablet Take 650 mg by mouth every 6 (six) hours as needed for mild pain.   Yes [provider]  aspirin EC 81 MG tablet Take 1 tablet (81 mg total)  by mouth daily. 07/13/17  Yes Weaver, Scott T, PA-C  baclofen (LIORESAL) 10 MG tablet Take 10 mg by mouth 2 (two) times daily.    Yes [provider]  busPIRone (BUSPAR) 15 MG tablet TAKE 15 MG BY MOUTH 3 TIMES A DAY 06/17/16  Yes [provider]  dicyclomine (BENTYL) 20 MG tablet TAKE 1 TABLET 20-30 MINUTES BEFORE MEALS IF YOU ARE EATING, OTHERWISE JUST TAKE FOUR TIMES DAILY. 04/13/18  Yes Levin Erp, PA  furosemide (LASIX) 40 MG tablet Take 1 tablet (40 mg total) by mouth daily. 03/31/18 06/29/18 Yes Luetta Nutting, DO  glycopyrrolate (ROBINUL) 2 MG tablet Take 1 tablet (2 mg total) by mouth 2 (two) times daily. 05/01/17  Yes Pyrtle, Lajuan Lines, MD  insulin aspart (NOVOLOG FLEXPEN) 100 UNIT/ML FlexPen Inject 30-40 Units into the skin 3 (three) times daily with  meals. 03/31/18  Yes Luetta Nutting, DO  Insulin Glargine (TOUJEO SOLOSTAR) 300 UNIT/ML SOPN Inject 110 Units into the skin 2 (two) times daily. Patient taking differently: Inject 120 Units into the skin 2 (two) times daily.  03/31/18  Yes Luetta Nutting, DO  isosorbide mononitrate (IMDUR) 60 MG 24 hr tablet Take 1 tablet (60 mg total) by mouth daily. 08/14/17 05/12/18 Yes Weaver, Scott T, PA-C  levalbuterol [facility] HFA) 45 MCG/ACT inhaler Inhale 1 puff into the lungs every 4 (four) hours as needed for wheezing. 06/22/17 06/22/18 Yes Long, Wonda Olds, MD  methocarbamol (ROBAXIN) 750 MG tablet Take 750 mg by mouth every 6 (six) hours as needed for muscle spasms. 04/29/17  Yes [provider]  mometasone-formoterol (DULERA) 100-5 MCG/ACT AERO Take 2 puffs first thing in am and then another 2 puffs about 12 hours later. 03/10/18  Yes Tanda Rockers, MD  nitroGLYCERIN (NITROSTAT) 0.4 MG SL tablet Place 1 tablet (0.4 mg total) under the tongue every 5 (five) minutes as needed. 07/27/17  Yes Richardson Dopp T, PA-C  omeprazole (PRILOSEC) 40 MG capsule Take 30- 60 min before your first and last meals of the day 01/13/18  Yes Tanda Rockers,  MD  potassium chloride SA (KLOR-CON M20) 20 MEQ tablet Take 1 tablet (20 mEq total) by mouth daily. 07/13/17 05/12/18 Yes Weaver, Scott T, PA-C  pregabalin (LYRICA) 75 MG capsule Take 1 capsule (75 mg total) by mouth 2 (two) times daily. 04/01/18  Yes Trula Slade, DPM  rosuvastatin (CRESTOR) 20 MG tablet Take 1 tablet (20 mg total) by mouth daily. 04/29/18 07/28/18 Yes Luetta Nutting, DO  topiramate (TOPAMAX) 25 MG tablet Take 25 mg by mouth twice daily 11/25/12  Yes [provider]  Verapamil HCl CR 300 MG CP24 Take 1 capsule (300 mg total) by mouth daily. 07/13/17  Yes Weaver, Scott T, PA-C  Blood Glucose Monitoring Suppl (ONETOUCH VERIO FLEX SYSTEM) w/Device KIT 1 application by Does not apply route 4 (four) times daily. Use to check blood glucose qid.  Diagnosis: Type 2 Diabetes E11.59 03/31/18   Luetta Nutting, DO  FLUoxetine (PROZAC) 10 MG capsule Take 4 capsules (40 mg total) by mouth at bedtime. Patient not taking: Reported on 05/12/2018 11/24/13   Kandis Nab, MD  pregabalin (LYRICA) 75 MG capsule Take 1 capsule (75 mg total) by mouth 2 (two) times daily. Patient not taking: Reported on 05/12/2018 09/14/17   Trula Slade, DPM    Family History Family History  Problem Relation Age of Onset  . Colon cancer Sister 79  . Breast cancer Mother 48  . Alzheimer's disease Mother   . Diabetes Sister 58  . Heart failure Sister   . Diabetes Sister 105  . Lung cancer Father   . Breast cancer Sister   . Cancer Sister        mouth  . Anesthesia problems Neg Hx   . Hypotension Neg Hx   . Malignant hyperthermia Neg Hx   . Pseudochol deficiency Neg Hx     Social History Social History   Tobacco Use  . Smoking status: Current Every Day Smoker    Packs/day: 0.50    Years: 35.00    Pack years: 17.50    Types: Cigarettes  . Smokeless tobacco: Never Used  . Tobacco comment: 07/26/2012 has decreased smoking from 2ppd; offered cessation materials; pt declines  Substance Use  Topics  . Alcohol use: Yes    Alcohol/week: 0.6 oz  Types: 1 Glasses of wine per week    Comment: rarely  . Drug use: No     Allergies   Atorvastatin; Ibuprofen; Metformin and related; Ace inhibitors; Hydrocodone-acetaminophen; and Albuterol   Review of Systems Review of Systems  Constitutional: Negative for appetite change, diaphoresis, fatigue, fever and unexpected weight change.  HENT: Negative for mouth sores.   Eyes: Negative for visual disturbance.  Respiratory: Negative for cough, chest tightness, shortness of breath and wheezing.   Cardiovascular: Negative for chest pain.  Gastrointestinal: Positive for abdominal pain. Negative for constipation, diarrhea, nausea and vomiting.  Endocrine: Negative for polydipsia, polyphagia and polyuria.  Genitourinary: Negative for dysuria, frequency, hematuria and urgency.  Musculoskeletal: Negative for back pain and neck stiffness.  Skin: Negative for rash.  Allergic/Immunologic: Negative for immunocompromised state.  Neurological: Negative for syncope, light-headedness and headaches.  Hematological: Does not bruise/bleed easily.  Psychiatric/Behavioral: Negative for sleep disturbance. The patient is not nervous/anxious.      Physical Exam Updated Vital Signs BP (!) 192/90   Pulse 92   Temp 98.5 F (36.9 C) (Oral)   Resp 16   SpO2 91%   Physical Exam  Constitutional: She appears well-developed and well-nourished. No distress.  Awake, alert, nontoxic appearance Obese  HENT:  Head: Normocephalic and atraumatic.  Mouth/Throat: Oropharynx is clear and moist. No oropharyngeal exudate.  Eyes: Conjunctivae are normal. No scleral icterus.  Neck: Normal range of motion. Neck supple.  Cardiovascular: Normal rate, regular rhythm and intact distal pulses.  Pulmonary/Chest: Effort normal and breath sounds normal. No respiratory distress. She has no wheezes.  Equal chest expansion  Abdominal: Soft. Bowel sounds are normal. She  exhibits no mass. There is tenderness in the right upper quadrant, right lower quadrant and periumbilical area. There is no rebound and no guarding.  Exam limited by body habitus  Musculoskeletal: Normal range of motion.  Neurological: She is alert.  Speech is clear and goal oriented Moves extremities without ataxia  Skin: Skin is warm and dry. She is not diaphoretic.  Psychiatric: She has a normal mood and affect.  Nursing note and vitals reviewed.    ED Treatments / Results  Labs (all labs ordered are listed, but only abnormal results are displayed) Labs Reviewed  CBC WITH DIFFERENTIAL/PLATELET - Abnormal; Notable for the following components:      Result Value   RBC 5.20 (*)    Hemoglobin 15.8 (*)    HCT 47.6 (*)    All other components within normal limits  COMPREHENSIVE METABOLIC PANEL - Abnormal; Notable for the following components:   Chloride 100 (*)    Glucose, Bld 300 (*)    BUN 24 (*)    Creatinine, Ser 1.46 (*)    GFR calc non Af Amer 38 (*)    GFR calc Af Amer 44 (*)    All other components within normal limits  LIPASE, BLOOD - Abnormal; Notable for the following components:   Lipase 54 (*)    All other components within normal limits  URINALYSIS, ROUTINE W REFLEX MICROSCOPIC - Abnormal; Notable for the following components:   Glucose, UA >=500 (*)    Protein, ur >=300 (*)    Bacteria, UA RARE (*)    All other components within normal limits     Radiology US Abdomen Complete  Result Date: 05/12/2018 CLINICAL DATA:  Acute onset of right-sided abdominal pain. EXAM: ABDOMEN ULTRASOUND COMPLETE COMPARISON:  Renal ultrasound performed 03/31/2017, and CT of the abdomen and pelvis from 05/06/2012 FINDINGS:  Gallbladder: No gallstones or wall thickening visualized. No sonographic Murphy sign noted by sonographer. Common bile duct: Diameter: 0.6 cm, within normal limits in caliber. Liver: No focal lesion identified. Increased parenchymal echogenicity and coarsened  echotexture, compatible with fatty infiltration. Portal vein is patent on color Doppler imaging with normal direction of blood flow towards the liver. IVC: Not visualized. Pancreas: Not visualized. Spleen: Size and appearance within normal limits. Right Kidney: Length: 10.4 cm. Echogenicity within normal limits. No mass or hydronephrosis visualized. Left Kidney: Length: 9.9 cm. Echogenicity within normal limits. No mass or hydronephrosis visualized. Abdominal aorta: No aneurysm visualized. Not well characterized distally due to overlying structures. Other findings: None. IMPRESSION: 1. No acute abnormality seen within the abdomen. 2. Diffuse fatty infiltration within the liver. Electronically Signed   By: Garald Balding M.D.   On: 05/12/2018 06:07    Procedures Procedures (including critical care time)  Medications Ordered in ED Medications  oxyCODONE (Oxy IR/ROXICODONE) immediate release tablet 10 mg (has no administration in time range)  morphine 4 MG/ML injection 4 mg (4 mg Intravenous Given 05/12/18 0415)  ondansetron (ZOFRAN) injection 4 mg (4 mg Intravenous Given 05/12/18 0415)  sodium chloride 0.9 % bolus 250 mL (250 mLs Intravenous New Bag/Given 05/12/18 0636)     Initial Impression / Assessment and Plan / ED Course  I have reviewed the triage vital signs and the nursing notes.  Pertinent labs & imaging results that were available during my care of the patient were reviewed by me and considered in my medical decision making (see chart for details).  Clinical Course as of May 13 651  Wed May 12, 2018  0539 No leukocytosis.  Afebrile.  No evidence of sepsis.  WBC: 10.2 [HM]  0539 No evidence of urinary tract infection  Leukocytes, UA: NEGATIVE [HM]  0539 Minimally elevated lipase  Lipase(!): 54 [HM]  0539 Normal AST/ALT  ALT: 14 [HM]  0539 Patient is hypertensive.  BP(!): 192/90 [HM]    Clinical Course User Index [HM] Daveon Arpino, Jarrett Soho, PA-C    Patient presents with entire  right-sided abdominal pain.  It is worse with coughing.  Breath sounds are coarse throughout but she has no focal wheezes, rhonchi or rales.  Abdomen is tender on exam but she is without rebound or guarding.  Patient is well-appearing.  Elevation in serum creatinine and BUN from baseline.  Suspect dehydration as patient reports significantly decreased water intake over the last 4 days.  Additionally her hemoglobin is elevated most consistent with hemoconcentration.  Patient being given a fluid bolus.  She is hypertensive but has not had her medications.  No chest pain or shortness of breath.  Ultrasound without acute findings, specifically no gallstones or evidence of cholecystitis.  She will need close follow-up with her primary care physician.  She and sister state understanding and are in agreement with the plan.  Final Clinical Impressions(s) / ED Diagnoses   Final diagnoses:  Right sided abdominal pain  Chronic cough    ED Discharge Orders    None       Loni Muse Gwenlyn Perking 05/12/18 [id number]    Fatima Blank, MD 05/12/18 [id number]

## 2018-05-17 ENCOUNTER — Other Ambulatory Visit: Payer: Self-pay | Admitting: Physician Assistant

## 2018-05-19 ENCOUNTER — Telehealth: Payer: Self-pay

## 2018-05-19 NOTE — Telephone Encounter (Signed)
Tried to call, but faxed return back to them with the CANCEL order on it. It is not being approved by Dr Zigmund Daniel. TLG

## 2018-05-19 NOTE — Telephone Encounter (Signed)
Copied from [place] [id number]. Topic: General - Other >> May 19, 2018  1:01 PM Yvette Rack wrote: Reason for CRM: Larene Beach with [employer] asking about the fax that was sent on 04/30/18 regarding Orthotic support. Larene Beach states she will fax the request again today 05/19/18. Cb# [phone]

## 2018-05-25 ENCOUNTER — Ambulatory Visit: Payer: Medicare Other | Admitting: Internal Medicine

## 2018-05-28 ENCOUNTER — Ambulatory Visit (INDEPENDENT_AMBULATORY_CARE_PROVIDER_SITE_OTHER): Payer: Medicare Other | Admitting: Podiatry

## 2018-05-28 ENCOUNTER — Encounter: Payer: Self-pay | Admitting: Podiatry

## 2018-05-28 DIAGNOSIS — B351 Tinea unguium: Secondary | ICD-10-CM

## 2018-05-28 DIAGNOSIS — M79674 Pain in right toe(s): Secondary | ICD-10-CM | POA: Diagnosis not present

## 2018-05-28 DIAGNOSIS — M79675 Pain in left toe(s): Secondary | ICD-10-CM | POA: Diagnosis not present

## 2018-05-31 ENCOUNTER — Other Ambulatory Visit: Payer: Self-pay | Admitting: Internal Medicine

## 2018-05-31 MED ORDER — DICYCLOMINE HCL 20 MG PO TABS: ORAL_TABLET | ORAL | 1 refills | Status: DC

## 2018-05-31 NOTE — Addendum Note (Signed)
Addended by: Larina Bras on: 05/31/2018 10:55 AM   Modules accepted: Orders

## 2018-06-02 NOTE — Progress Notes (Signed)
Subjective: [name] returns the office today for painful, elongated, thickened toenails which she cannot trim herself. Denies any redness or drainage around the nails. Denies any acute changes since last appointment and no new complaints today. Denies any systemic complaints such as fevers, chills, nausea, vomiting.   Objective: AAO 3, NAD DP/PT pulses palpable, CRT less than 3 seconds Sensation decreased with SWMF.  Nails hypertrophic, dystrophic, elongated, brittle, discolored 9. There is tenderness overlying the nails 1-5 bilaterally except the left hallux nail which has previously been removed. There is no surrounding erythema or drainage along the nail sites. Small pieces of nail and the corners of the left hallux toenail does cause intermittent pain.  No open lesions or pre-ulcerative lesions are identified. No other areas of tenderness bilateral lower extremities. No overlying edema, erythema, increased warmth. No pain with calf compression, swelling, warmth, erythema.  Assessment: Patient presents with symptomatic onychomycosis; neuropathy  Plan: -Treatment options including alternatives, risks, complications were discussed -Nails sharply debrided 9 without complication/bleeding. She refuses debridement of left great toe retained spicules. -Discussed daily foot inspection. If there are any changes, to call the office immediately.  -Follow-up in 3 months or sooner if any problems are to arise. In the meantime, encouraged to call the office with any questions, concerns, changes symptoms.

## 2018-06-24 ENCOUNTER — Other Ambulatory Visit: Payer: Self-pay | Admitting: Cardiology

## 2018-06-24 DIAGNOSIS — I471 Supraventricular tachycardia: Secondary | ICD-10-CM

## 2018-06-25 MED ORDER — VERAPAMIL HCL ER 300 MG PO CP24: 300.0000 mg | ORAL_CAPSULE | Freq: Every day | ORAL | 1 refills | Status: DC

## 2018-06-30 ENCOUNTER — Ambulatory Visit (INDEPENDENT_AMBULATORY_CARE_PROVIDER_SITE_OTHER): Payer: Medicare Other | Admitting: Family Medicine

## 2018-06-30 ENCOUNTER — Other Ambulatory Visit: Payer: Self-pay

## 2018-06-30 ENCOUNTER — Encounter: Payer: Self-pay | Admitting: Family Medicine

## 2018-06-30 VITALS — BP 120/60 | HR 76 | Temp 98.7°F | Ht 63.5 in | Wt 247.9 lb

## 2018-06-30 DIAGNOSIS — Z1231 Encounter for screening mammogram for malignant neoplasm of breast: Secondary | ICD-10-CM | POA: Diagnosis not present

## 2018-06-30 DIAGNOSIS — Z794 Long term (current) use of insulin: Secondary | ICD-10-CM | POA: Diagnosis not present

## 2018-06-30 DIAGNOSIS — F17210 Nicotine dependence, cigarettes, uncomplicated: Secondary | ICD-10-CM

## 2018-06-30 DIAGNOSIS — E1142 Type 2 diabetes mellitus with diabetic polyneuropathy: Secondary | ICD-10-CM | POA: Diagnosis not present

## 2018-06-30 DIAGNOSIS — Z1159 Encounter for screening for other viral diseases: Secondary | ICD-10-CM

## 2018-06-30 DIAGNOSIS — I10 Essential (primary) hypertension: Secondary | ICD-10-CM

## 2018-06-30 DIAGNOSIS — Z1239 Encounter for other screening for malignant neoplasm of breast: Secondary | ICD-10-CM

## 2018-06-30 DIAGNOSIS — R51 Headache: Secondary | ICD-10-CM | POA: Diagnosis not present

## 2018-06-30 DIAGNOSIS — R519 Headache, unspecified: Secondary | ICD-10-CM

## 2018-06-30 LAB — BASIC METABOLIC PANEL
BUN: 24 mg/dL — ABNORMAL HIGH (ref 6–23)
CO2: 27 mEq/L (ref 19–32)
Calcium: 8.5 mg/dL (ref 8.4–10.5)
Chloride: 95 mEq/L — ABNORMAL LOW (ref 96–112)
Creatinine, Ser: 1.79 mg/dL — ABNORMAL HIGH (ref 0.40–1.20)
GFR: 30.58 mL/min — ABNORMAL LOW (ref >60.00)
Glucose, Bld: 208 mg/dL — ABNORMAL HIGH (ref 70–99)
Potassium: 4.1 mEq/L (ref 3.5–5.1)
Sodium: 132 mEq/L — ABNORMAL LOW (ref 135–145)

## 2018-06-30 LAB — HEMOGLOBIN A1C: Hgb A1c MFr Bld: 9.5 % — ABNORMAL HIGH (ref 4.6–6.5)

## 2018-06-30 MED ORDER — PREGABALIN 150 MG PO CAPS: 150.0000 mg | ORAL_CAPSULE | Freq: Two times a day (BID) | ORAL | 5 refills | Status: DC

## 2018-06-30 MED ORDER — KETOROLAC TROMETHAMINE 60 MG/2ML IM SOLN
60.0000 mg | Freq: Once | INTRAMUSCULAR | Status: AC
  Administered 2018-06-30: 60 mg via INTRAMUSCULAR

## 2018-06-30 MED ORDER — ACETAMINOPHEN 325 MG PO TABS: 650.0000 mg | ORAL_TABLET | Freq: Four times a day (QID) | ORAL | 2 refills | Status: AC | PRN

## 2018-06-30 MED ORDER — ONDANSETRON HCL 4 MG/2ML IJ SOLN
4.0000 mg | Freq: Once | INTRAMUSCULAR | Status: AC
  Administered 2018-06-30: 4 mg via INTRAMUSCULAR

## 2018-06-30 NOTE — Assessment & Plan Note (Signed)
Not well controlled Update a1c today Discussed that reading in the 120's is not too low, and while she may feel bad initially this is closer to where her goal should be.

## 2018-06-30 NOTE — Assessment & Plan Note (Signed)
BP well controlled Continue current medications Follow low salt diet.

## 2018-06-30 NOTE — Assessment & Plan Note (Signed)
>  10 minutes spent counseling regarding quitting smoking Offered assistance, declines at this time.  She will work on cutting back on her own.  F/u progress at next appt in 3 months.

## 2018-06-30 NOTE — Assessment & Plan Note (Signed)
?  medication rebound headache (frequent tylenol use, recently ran out) vs migraine Given injection of toradol and zofran today She will let me know if not improving.

## 2018-06-30 NOTE — Progress Notes (Signed)
ENGLISH TOMER - 62 y.o. female MRN [id number]  Date of birth: 1956-02-03  Subjective Chief Complaint  Patient presents with  . Headache    headache started about a week ago, been feeling nauseated. headache was sobad last last night she wanted to go to ED but didnt have e ride.  . Medication Problem    been doubling up on dose of Lyrica    HPI [name] is a 62 y.o. female here today for follow up of T2DM and HTN.  She also has complaint of headache.  -Headache:  Reports headache began about a week ago. Has waxed and waned by never fully resolved.  Had been using tylenol for headaches but had ran out of this.  Report headache worsened last night to the point that she thought she may need to go to the ER.  Headache is a little better today but still present.  She has had some associated nausea as well.  Has history of CSF leak but denies drainage from nose.  She denies fever, chills, sinus pain/pressure, light sensitivity or dizziness.   -T2DM:  History of poorly controlled type 2 diabetes.  She reports compliance with current medications although continues to difficulty following a healthy diet.  She continues to drink sweet tea and is not very active.  Reports blood sugar was 123 yesterday and she felt like this was too low so she drank some juice.  She does have neuropathy related to diabetes and is taking lyrica.  She increased her lyrica to 141m at bedtime recently.   -HTN:  Compliant with blood pressure medication.  Does not monitor BP at home.  Headache today but denies anginal symptoms, increased sob, increased edema, or palpitations.   ROS:  A comprehensive ROS was completed and negative except as noted per HPI  Allergies  Allergen Reactions  . Atorvastatin Other (See Comments) and Nausea And Vomiting    "legs cramped; moderate to almost severe"  . Ibuprofen Other (See Comments)    Other reaction(s): Other (See Comments) Per pt, MD doesn't want pt to take Per pt,  MD doesn't want pt to take  . Metformin And Related Nausea And Vomiting  . Ace Inhibitors Cough and Nausea And Vomiting  . Hydrocodone-Acetaminophen Nausea And Vomiting  . Albuterol Other (See Comments)    Caused SVT    Past Medical History:  Diagnosis Date  . Anxiety    doesn't take any meds for this  . Arteriosclerosis of coronary artery 08/31/2011   Myoview 9/12: EF 44, no ischemia // Myoview 8/18: EF 57, low risk, possible small area of inferior ischemia // Minor nonobstructive CAD by cardiac catheterization - [facility] 8/18: Proximal LAD 35, OM2 25  . Arthritis    knuckles  . Asthma   . Carotid artery disease ([facility]    Carotid [place]: Bilateral ICA 40-59 // Carotid [place]([facility]) 2/17: bilat plaque without sig stenosis  . Chronic diastolic CHF (congestive heart failure) ([facility] 07/26/2017   Echo 7/18:Severe conc LVH, EF 60-65, no RWMA, Gr 2 DD, MAC, mild MR // right and left heart cath 8/18: LVEDP 21  . Claustrophobia 07/26/2012  . COPD [place]chronic obstructive pulmonary disease) ([facility]    "chronic bronchitis - about every winter"  . CSF leak    dizziness/headache - assoc symptoms // 2/2 encephalocele s/p skull base reconstruction  . Diverticulosis   . History of CVA (cerebrovascular accident) 03/2004   denies residual 07/26/2012  . Hx of gout   .  Hyperlipidemia    takes Pravastatin daily  . Hypertensive heart disease with chronic diastolic congestive heart failure ([place]) 07/26/2017  . Internal hemorrhoids   . Kidney stones   . LVH (left ventricular hypertrophy)    Echo 8/18: Severe conc LVH, EF 60-65, no RWMA, Gr 2 DD, MAC, mild MR  . OSA (obstructive sleep apnea) 07/26/2012   prior CPAP - stopped due to being primary caregiver for dying parent (stopped in 2016)  . PSVT (paroxysmal supraventricular tachycardia) ([place])   . Pulmonary hypertension, unspecified ([place]) 08/14/2017   Right heart cath 8/13:RVSP 66, PASP 57, mean PA 39, mean PWCP 18, LVEDP 21  . Type II diabetes mellitus ([place])      Past Surgical History:  Procedure Laterality Date  . ESOPHAGOGASTRODUODENOSCOPY (EGD) WITH PROPOFOL N/A 07/13/2015   Procedure: ESOPHAGOGASTRODUODENOSCOPY (EGD) WITH PROPOFOL;  Surgeon: Carol Ada, MD;  Location: [facility];  Service: Endoscopy;  Laterality: N/A;  . NASAL SINUS SURGERY  01/29/2012   Procedure: ENDOSCOPIC SINUS SURGERY WITH STEALTH;  Surgeon: Ruby Cola, MD;  Location: [facility];  Service: ENT;  Laterality: N/A;  . NM MYOVIEW LTD  08/25/2011   Low risk study, no evidence of ischemia, EF 44%  . REFRACTIVE SURGERY  ~ 2010   left  . RIGHT/LEFT HEART CATH AND CORONARY ANGIOGRAPHY N/A 07/30/2017   Procedure: RIGHT/LEFT HEART CATH AND CORONARY ANGIOGRAPHY;  Surgeon: Martinique, Peter M, MD;  Location: [facility];  Service: Cardiovascular;  Laterality: N/A;  . SPHENOIDECTOMY  01/29/2012   Procedure: SPHENOIDECTOMY;  Surgeon: Ruby Cola, MD;  Location: [facility];  Service: ENT;  Laterality: Left;  REPAIR OF LEFT SPHENOID    . TOTAL ABDOMINAL HYSTERECTOMY  1994    Social History   Socioeconomic History  . Marital status: Single    Spouse name: Not on file  . Number of children: 0  . Years of education: Not on file  . Highest education level: Not on file  Occupational History  . Occupation: disabled  Social Needs  . Financial resource strain: Not on file  . Food insecurity:    Worry: Not on file    Inability: Not on file  . Transportation needs:    Medical: Not on file    Non-medical: Not on file  Tobacco Use  . Smoking status: Current Every Day Smoker    Packs/day: 0.50    Years: 35.00    Pack years: 17.50    Types: Cigarettes  . Smokeless tobacco: Never Used  . Tobacco comment: 07/26/2012 has decreased smoking from 2ppd; offered cessation materials; pt declines  Substance and Sexual Activity  . Alcohol use: Yes    Alcohol/week: 0.6 oz    Types: 1 Glasses of wine per week    Comment: rarely  . Drug use: No  . Sexual activity: Never    Birth  control/protection: Surgical  Lifestyle  . Physical activity:    Days per week: Not on file    Minutes per session: Not on file  . Stress: Not on file  Relationships  . Social connections:    Talks on phone: Not on file    Gets together: Not on file    Attends religious service: Not on file    Active member of club or organization: Not on file    Attends meetings of clubs or organizations: Not on file    Relationship status: Not on file  Other Topics Concern  . Not on file  Social History Narrative  Currently unemployed.  Really wants to find a job, but has been difficult for her.  No children, not married    Family History  Problem Relation Age of Onset  . Colon cancer Sister 4  . Breast cancer Mother 72  . Alzheimer's disease Mother   . Diabetes Sister 21  . Heart failure Sister   . Diabetes Sister 39  . Lung cancer Father   . Breast cancer Sister   . Cancer Sister        mouth  . Anesthesia problems Neg Hx   . Hypotension Neg Hx   . Malignant hyperthermia Neg Hx   . Pseudochol deficiency Neg Hx     Health Maintenance  Topic Date Due  . Hepatitis C Screening  08-18-1956  . PAP SMEAR  04/26/1978  . OPHTHALMOLOGY EXAM  03/04/2014  . FOOT EXAM  06/20/2015  . MAMMOGRAM  06/25/2018  . HIV Screening  04/01/2019 (Originally 04/26/1972)  . INFLUENZA VACCINE  07/08/2018  . HEMOGLOBIN A1C  09/30/2018  . COLONOSCOPY  10/09/2019  . PNEUMOCOCCAL POLYSACCHARIDE VACCINE (2) 01/09/2021  . TETANUS/TDAP  03/29/2025    ----------------------------------------------------------------------------------------------------------------------------------------------------------------------------------------------------------------- Physical Exam BP 120/60 (BP Location: Right Arm, Patient Position: Sitting, Cuff Size: Large)   Pulse 76   Temp 98.7 F (37.1 C) (Oral)   Ht 5' 3.5" (1.613 m)   Wt 247 lb 14.4 oz (112.4 kg)   SpO2 95%   BMI 43.22 kg/m   Physical Exam    Constitutional: She is oriented to person, place, and time. She appears well-nourished. No distress.  HENT:  Head: Normocephalic and atraumatic.  Mouth/Throat: Oropharynx is clear and moist.  Eyes: Pupils are equal, round, and reactive to light. EOM are normal. No scleral icterus.  Neck: Neck supple. No thyromegaly present.  Cardiovascular: Normal rate, regular rhythm, normal heart sounds and intact distal pulses.  Pulmonary/Chest: Effort normal and breath sounds normal.  Musculoskeletal: She exhibits no edema.  Lymphadenopathy:    She has no cervical adenopathy.  Neurological: She is alert and oriented to person, place, and time. No cranial nerve deficit. Coordination normal.  Skin: Skin is warm and dry. No rash noted.  Psychiatric: She has a normal mood and affect. Her behavior is normal.    ------------------------------------------------------------------------------------------------------------------------------------------------------------------------------------------------------------------- Assessment and Plan  Screening for breast cancer Screening mammogram ordered  Acute intractable headache ?medication rebound headache (frequent tylenol use, recently ran out) vs migraine Given injection of toradol and zofran today She will let me know if not improving.    Nicotine dependence, cigarettes, uncomplicated >54 minutes spent counseling regarding quitting smoking Offered assistance, declines at this time.  She will work on cutting back on her own.  F/u progress at next appt in 3 months.   Type 2 diabetes mellitus with diabetic polyneuropathy, with long-term current use of insulin (HCC) Not well controlled Update a1c today Discussed that reading in the 120's is not too low, and while she may feel bad initially this is closer to where her goal should be.    Essential hypertension BP well controlled Continue current medications Follow low salt diet.

## 2018-06-30 NOTE — Patient Instructions (Signed)
Continue current medications for now Be sure to stay well hydrated If headache is still not resolving please let me know  I have placed referrals for the eye doctor and for a mammogram.

## 2018-06-30 NOTE — Assessment & Plan Note (Signed)
Screening mammogram ordered

## 2018-07-01 LAB — HEPATITIS C ANTIBODY
Hepatitis C Ab: NONREACTIVE
SIGNAL TO CUT-OFF: 0.01 (ref <1.00)

## 2018-07-02 ENCOUNTER — Telehealth: Payer: Self-pay

## 2018-07-02 MED ORDER — ONDANSETRON HCL 4 MG PO TABS: 4.0000 mg | ORAL_TABLET | Freq: Three times a day (TID) | ORAL | 0 refills | Status: DC | PRN

## 2018-07-02 MED ORDER — BUTALBITAL-APAP-CAFFEINE 50-325-40 MG PO TABS
1.0000 | ORAL_TABLET | Freq: Four times a day (QID) | ORAL | 0 refills | Status: AC | PRN
Start: 2018-07-02 — End: 2019-07-02

## 2018-07-02 NOTE — Telephone Encounter (Signed)
I have sent in a few tabs of fioricet as well as zofran.  If not improving with this please let me know.  If worsening over the weekend or develops new symptoms she should be seen in the ER.

## 2018-07-02 NOTE — Telephone Encounter (Signed)
Informed pt that new rx was called into pharmacy and informed her of Dr Zigmund Daniel instruction. Pt verbalized understanding

## 2018-07-02 NOTE — Telephone Encounter (Signed)
Copied from [place] [id number]. Topic: General - Other >> Jul 02, 2018  9:22 AM [name] R wrote: Pt is calling in stating that she needs something stronger for her headaches tylenol isnt going to work and also she hasnt received meds for nausea

## 2018-07-05 NOTE — Progress Notes (Signed)
-  Glucose remains high, but improved some from previous check.  A1c improved some also.  Keep working on reduction of sugars and starchy foods.   -Kidney function is worsening, likely coming from her diabetes. Recommend referral to kidney specialist.

## 2018-07-07 NOTE — Progress Notes (Signed)
Yes, that would be fine.  If she has seen her in the past 1-2 years she shouldn't need a new referral and can call to schedule a f/u appt.  Please let her know that it is important for her to make this appt.

## 2018-07-15 ENCOUNTER — Other Ambulatory Visit: Payer: Self-pay | Admitting: Physician Assistant

## 2018-07-15 DIAGNOSIS — I5032 Chronic diastolic (congestive) heart failure: Secondary | ICD-10-CM

## 2018-07-19 ENCOUNTER — Encounter (INDEPENDENT_AMBULATORY_CARE_PROVIDER_SITE_OTHER): Payer: Medicare Other | Admitting: Ophthalmology

## 2018-07-29 ENCOUNTER — Ambulatory Visit: Payer: Medicare Other | Admitting: Podiatry

## 2018-08-12 ENCOUNTER — Ambulatory Visit: Payer: Medicare Other | Admitting: Podiatry

## 2018-08-16 ENCOUNTER — Ambulatory Visit (INDEPENDENT_AMBULATORY_CARE_PROVIDER_SITE_OTHER): Payer: Medicare Other | Admitting: Podiatry

## 2018-08-16 ENCOUNTER — Encounter: Payer: Self-pay | Admitting: Podiatry

## 2018-08-16 DIAGNOSIS — B351 Tinea unguium: Secondary | ICD-10-CM

## 2018-08-16 DIAGNOSIS — M79676 Pain in unspecified toe(s): Secondary | ICD-10-CM | POA: Diagnosis not present

## 2018-08-16 DIAGNOSIS — E1140 Type 2 diabetes mellitus with diabetic neuropathy, unspecified: Secondary | ICD-10-CM

## 2018-08-18 NOTE — Progress Notes (Signed)
Subjective: [name], 62 year old female, returns the office today for painful, elongated, thickened toenails which she cannot trim herself. Denies any redness or drainage around the nails. Denies any acute changes since last appointment and no new complaints today. Denies any systemic complaints such as fevers, chills, nausea, vomiting.   Objective: AAO 3, NAD DP/PT pulses palpable, CRT less than 3 seconds Sensation decreased with SWMF.  Nails hypertrophic, dystrophic, elongated, brittle, discolored 9. There is tenderness overlying the nails 1-5 bilaterally except the left hallux nail which has previously been removed. There is no surrounding erythema or drainage along the nail sites.  No open lesions or pre-ulcerative lesions are identified. No other areas of tenderness bilateral lower extremities. No overlying edema, erythema, increased warmth. No pain with calf compression, swelling, warmth, erythema.  Assessment: Patient presents with symptomatic onychomycosis; neuropathy  Plan: -Treatment options including alternatives, risks, complications were discussed -Nails sharply debrided 9 without complication/bleeding. She refuses debridement of left great toe retained spicules. -Discussed daily foot inspection. If there are any changes, to call the office immediately.  -Follow-up in 3 months or sooner if any problems are to arise. In the meantime, encouraged to call the office with any questions, concerns, changes symptoms.  Trula Slade DPM

## 2018-08-25 DIAGNOSIS — N182 Chronic kidney disease, stage 2 (mild): Secondary | ICD-10-CM | POA: Diagnosis not present

## 2018-08-25 DIAGNOSIS — M109 Gout, unspecified: Secondary | ICD-10-CM | POA: Diagnosis not present

## 2018-08-25 DIAGNOSIS — E1122 Type 2 diabetes mellitus with diabetic chronic kidney disease: Secondary | ICD-10-CM | POA: Diagnosis not present

## 2018-08-25 DIAGNOSIS — Z720 Tobacco use: Secondary | ICD-10-CM | POA: Diagnosis not present

## 2018-08-25 DIAGNOSIS — Z6841 Body Mass Index (BMI) 40.0 and over, adult: Secondary | ICD-10-CM | POA: Diagnosis not present

## 2018-08-25 DIAGNOSIS — I129 Hypertensive chronic kidney disease with stage 1 through stage 4 chronic kidney disease, or unspecified chronic kidney disease: Secondary | ICD-10-CM | POA: Diagnosis not present

## 2018-09-01 ENCOUNTER — Other Ambulatory Visit: Payer: Self-pay | Admitting: Physician Assistant

## 2018-09-07 DIAGNOSIS — N390 Urinary tract infection, site not specified: Secondary | ICD-10-CM | POA: Diagnosis not present

## 2018-09-07 DIAGNOSIS — N182 Chronic kidney disease, stage 2 (mild): Secondary | ICD-10-CM | POA: Diagnosis not present

## 2018-09-07 DIAGNOSIS — I129 Hypertensive chronic kidney disease with stage 1 through stage 4 chronic kidney disease, or unspecified chronic kidney disease: Secondary | ICD-10-CM | POA: Diagnosis not present

## 2018-09-21 ENCOUNTER — Other Ambulatory Visit: Payer: Self-pay | Admitting: Cardiology

## 2018-09-21 DIAGNOSIS — I471 Supraventricular tachycardia: Secondary | ICD-10-CM

## 2018-09-29 ENCOUNTER — Other Ambulatory Visit: Payer: Self-pay | Admitting: Physician Assistant

## 2018-09-30 ENCOUNTER — Ambulatory Visit: Payer: Medicare Other | Admitting: Family Medicine

## 2018-10-02 DIAGNOSIS — I10 Essential (primary) hypertension: Secondary | ICD-10-CM | POA: Diagnosis not present

## 2018-10-02 DIAGNOSIS — R0902 Hypoxemia: Secondary | ICD-10-CM | POA: Diagnosis not present

## 2018-10-02 DIAGNOSIS — R42 Dizziness and giddiness: Secondary | ICD-10-CM | POA: Diagnosis not present

## 2018-10-02 DIAGNOSIS — I4891 Unspecified atrial fibrillation: Secondary | ICD-10-CM | POA: Diagnosis not present

## 2018-10-02 DIAGNOSIS — I499 Cardiac arrhythmia, unspecified: Secondary | ICD-10-CM | POA: Diagnosis not present

## 2018-10-12 ENCOUNTER — Other Ambulatory Visit: Payer: Self-pay | Admitting: Physician Assistant

## 2018-10-12 DIAGNOSIS — I5032 Chronic diastolic (congestive) heart failure: Secondary | ICD-10-CM

## 2018-10-17 ENCOUNTER — Other Ambulatory Visit: Payer: Self-pay | Admitting: Cardiology

## 2018-10-17 DIAGNOSIS — I471 Supraventricular tachycardia: Secondary | ICD-10-CM

## 2018-10-18 ENCOUNTER — Ambulatory Visit: Payer: Medicare Other | Admitting: Podiatry

## 2018-10-25 ENCOUNTER — Other Ambulatory Visit: Payer: Self-pay | Admitting: Physician Assistant

## 2018-10-29 ENCOUNTER — Other Ambulatory Visit: Payer: Self-pay | Admitting: Emergency Medicine

## 2018-10-29 ENCOUNTER — Telehealth: Payer: Self-pay | Admitting: Family Medicine

## 2018-10-29 MED ORDER — GLUCOSE BLOOD VI STRP: ORAL_STRIP | 12 refills | Status: DC

## 2018-10-29 NOTE — Telephone Encounter (Signed)
New order for One Touch Test strips /

## 2018-10-29 NOTE — Telephone Encounter (Signed)
Rx has been sent to patient pharmacy 

## 2018-10-29 NOTE — Telephone Encounter (Signed)
Copied from [place] [id number]. Topic: Quick Communication - Rx Refill/Question >> Oct 29, 2018  1:25 PM Reyne Dumas L wrote: Medication:  One Touch test strips.  Pt has gotten a new meter but has never had supplies ordered for this machine.  Has the patient contacted their pharmacy? Yes - states there is nothing on file for supplies with this machine.  Pt states she just used her last one, pt tests once or twice a day. (Agent: If no, request that the patient contact the pharmacy for the refill.) (Agent: If yes, when and what did the pharmacy advise?)  Preferred Pharmacy (with phone number or street name): [id number] [place], [place] [phone] (Phone) [phone] (Fax)  Agent: Please be advised that RX refills may take up to 3 business days. We ask that you follow-up with your pharmacy.

## 2018-11-03 ENCOUNTER — Other Ambulatory Visit: Payer: Self-pay | Admitting: Emergency Medicine

## 2018-11-03 ENCOUNTER — Telehealth: Payer: Self-pay | Admitting: Family Medicine

## 2018-11-03 MED ORDER — GLUCOSE BLOOD VI STRP: ORAL_STRIP | 12 refills | Status: DC

## 2018-11-03 MED ORDER — ACCU-CHEK AVIVA PLUS W/DEVICE KIT: PACK | 1 refills | Status: DC

## 2018-11-03 NOTE — Telephone Encounter (Signed)
New rx has been sent into patient pharmacy for Accu chek and strips.

## 2018-11-03 NOTE — Progress Notes (Signed)
New rx has been sent into patient pharmacy for Accu Chek device and strips.

## 2018-11-03 NOTE — Telephone Encounter (Signed)
Copied from [place] [phone]. Topic: Quick Communication - Rx Refill/Question >> Nov 03, 2018  9:37 AM Scherrie Gerlach wrote: Medication: Accu check meter and testing strips  The pt's insurance will cover the one touch meter supplies. They will cover Accu check.  Pt needs new Rx for a meter and testing strips.  Pt has no more strips and concerned for her sugar. Can you send asap? Thank you.  [id number] [address], [place] [phone] (Phone) [phone] (Fax)

## 2018-11-16 DIAGNOSIS — I129 Hypertensive chronic kidney disease with stage 1 through stage 4 chronic kidney disease, or unspecified chronic kidney disease: Secondary | ICD-10-CM | POA: Diagnosis not present

## 2018-11-16 DIAGNOSIS — N183 Chronic kidney disease, stage 3 (moderate): Secondary | ICD-10-CM | POA: Diagnosis not present

## 2018-11-16 DIAGNOSIS — E785 Hyperlipidemia, unspecified: Secondary | ICD-10-CM | POA: Diagnosis not present

## 2018-11-16 DIAGNOSIS — E1122 Type 2 diabetes mellitus with diabetic chronic kidney disease: Secondary | ICD-10-CM | POA: Diagnosis not present

## 2018-11-16 DIAGNOSIS — N390 Urinary tract infection, site not specified: Secondary | ICD-10-CM | POA: Diagnosis not present

## 2018-11-16 DIAGNOSIS — Z720 Tobacco use: Secondary | ICD-10-CM | POA: Diagnosis not present

## 2018-11-16 DIAGNOSIS — Z87440 Personal history of urinary (tract) infections: Secondary | ICD-10-CM | POA: Diagnosis not present

## 2018-11-16 DIAGNOSIS — N182 Chronic kidney disease, stage 2 (mild): Secondary | ICD-10-CM | POA: Diagnosis not present

## 2018-11-17 ENCOUNTER — Other Ambulatory Visit: Payer: Self-pay | Admitting: Internal Medicine

## 2018-11-18 ENCOUNTER — Ambulatory Visit (INDEPENDENT_AMBULATORY_CARE_PROVIDER_SITE_OTHER): Payer: Medicare Other | Admitting: Family Medicine

## 2018-11-18 ENCOUNTER — Encounter: Payer: Self-pay | Admitting: Family Medicine

## 2018-11-18 VITALS — BP 148/76 | HR 79 | Temp 97.9°F | Ht 63.5 in | Wt 237.0 lb

## 2018-11-18 DIAGNOSIS — F331 Major depressive disorder, recurrent, moderate: Secondary | ICD-10-CM

## 2018-11-18 DIAGNOSIS — F17210 Nicotine dependence, cigarettes, uncomplicated: Secondary | ICD-10-CM

## 2018-11-18 DIAGNOSIS — N183 Chronic kidney disease, stage 3 unspecified: Secondary | ICD-10-CM

## 2018-11-18 DIAGNOSIS — Z23 Encounter for immunization: Secondary | ICD-10-CM | POA: Insufficient documentation

## 2018-11-18 DIAGNOSIS — I10 Essential (primary) hypertension: Secondary | ICD-10-CM | POA: Diagnosis not present

## 2018-11-18 DIAGNOSIS — Z794 Long term (current) use of insulin: Secondary | ICD-10-CM | POA: Insufficient documentation

## 2018-11-18 DIAGNOSIS — E1122 Type 2 diabetes mellitus with diabetic chronic kidney disease: Secondary | ICD-10-CM

## 2018-11-18 LAB — BASIC METABOLIC PANEL
BUN: 16 mg/dL (ref 6–23)
CO2: 33 mEq/L — ABNORMAL HIGH (ref 19–32)
Calcium: 8.8 mg/dL (ref 8.4–10.5)
Chloride: 103 mEq/L (ref 96–112)
Creatinine, Ser: 1.28 mg/dL — ABNORMAL HIGH (ref 0.40–1.20)
GFR: 44.97 mL/min — ABNORMAL LOW (ref >60.00)
Glucose, Bld: 160 mg/dL — ABNORMAL HIGH (ref 70–99)
Potassium: 4.1 mEq/L (ref 3.5–5.1)
Sodium: 143 mEq/L (ref 135–145)

## 2018-11-18 LAB — HEMOGLOBIN A1C: Hgb A1c MFr Bld: 7.9 % — ABNORMAL HIGH (ref 4.6–6.5)

## 2018-11-18 NOTE — Assessment & Plan Note (Addendum)
-  Some increased symptoms due to death of her sister.  Expressed condolences.  Overall doing well with current medication. -Discussed grief counseling, she will consider.

## 2018-11-18 NOTE — Patient Instructions (Signed)
-  Good to see you today! -I will see you back in 3 months or sooner if needed -Have a Merry Christmas, you and your family are in my thoughts

## 2018-11-18 NOTE — Assessment & Plan Note (Signed)
-  BP is a little elevated today but recently restarted medications -Encouraged to check at home -Follow low salt diet

## 2018-11-18 NOTE — Progress Notes (Signed)
[name] - 62 y.o. female MRN [id number]  Date of birth: 04-25-1956  Subjective Chief Complaint  Patient presents with  . Follow-up    has been taking BS daily, states has been around 140. Last A1C 06/30/18. She has been stressed out-her sister just passed away last week.     HPI [name] is a 62 y.o. female with history of HTN, T2DM, CAD, and CKD here today for follow up of T2DM and HTN.  Unfortunately her sister passed away last week.  She had been ill for several months and she decided to stop hemodialysis.  She tells me that she has had good support from family and is managing ok.  She continues on fluoxetine with buspirone.    She has kind of neglected herself during her sisters illness and has not been consistent with taking her medications.  She has lost some weight but thinks this is from stress and not eating consistently.  Her blood sugars at home have been around 140.  She denies symptoms of hypoglycemia.    She did recently restart her BP medications.  She has not been checking BP at home.  She denies symptom related to HTN including chest pain, shortness of breath, palpitations, increased edema or headache.   She follows with nephrology for her CKD.   ROS:  A comprehensive ROS was completed and negative except as noted per HPI   Allergies  Allergen Reactions  . Atorvastatin Other (See Comments) and Nausea And Vomiting    "legs cramped; moderate to almost severe" "legs cramped; moderate to almost severe"  . Ibuprofen Other (See Comments)    Other reaction(s): Other (See Comments) Per pt, MD doesn't want pt to take Per pt, MD doesn't want pt to take  . Metformin And Related Nausea And Vomiting  . Ace Inhibitors Cough and Nausea And Vomiting  . Hydrocodone-Acetaminophen Nausea And Vomiting  . Albuterol Other (See Comments)    Caused SVT    Past Medical History:  Diagnosis Date  . Anxiety    doesn't take any meds for this  . Arteriosclerosis of  coronary artery 08/31/2011   Myoview 9/12: EF 44, no ischemia // Myoview 8/18: EF 57, low risk, possible small area of inferior ischemia // Minor nonobstructive CAD by cardiac catheterization - [facility] 8/18: Proximal LAD 35, OM2 25  . Arthritis    knuckles  . Asthma   . Carotid artery disease ([place])    Carotid US 7/14: Bilateral ICA 40-59 // Carotid US ([facility]) 2/17: bilat plaque without sig stenosis  . Chronic diastolic CHF (congestive heart failure) ([place]) 07/26/2017   Echo 7/18:Severe conc LVH, EF 60-65, no RWMA, Gr 2 DD, MAC, mild MR // right and left heart cath 8/18: LVEDP 21  . Claustrophobia 07/26/2012  . COPD (chronic obstructive pulmonary disease) ([place])    "chronic bronchitis - about every winter"  . CSF leak    dizziness/headache - assoc symptoms // 2/2 encephalocele s/p skull base reconstruction  . Diverticulosis   . History of CVA (cerebrovascular accident) 03/2004   denies residual 07/26/2012  . Hx of gout   . Hyperlipidemia    takes Pravastatin daily  . Hypertensive heart disease with chronic diastolic congestive heart failure ([place]) 07/26/2017  . Internal hemorrhoids   . Kidney stones   . LVH (left ventricular hypertrophy)    Echo 8/18: Severe conc LVH, EF 60-65, no RWMA, Gr 2 DD, MAC, mild MR  . OSA (obstructive sleep apnea) 07/26/2012  prior CPAP - stopped due to being primary caregiver for dying parent (stopped in 2016)  . PSVT (paroxysmal supraventricular tachycardia) ([place])   . Pulmonary hypertension, unspecified ([place]) 08/14/2017   Right heart cath 8/13:RVSP 66, PASP 57, mean PA 39, mean PWCP 18, LVEDP 21  . Type II diabetes mellitus ([place])     Past Surgical History:  Procedure Laterality Date  . ESOPHAGOGASTRODUODENOSCOPY (EGD) WITH PROPOFOL N/A 07/13/2015   Procedure: ESOPHAGOGASTRODUODENOSCOPY (EGD) WITH PROPOFOL;  Surgeon: Carol Ada, MD;  Location: [facility];  Service: Endoscopy;  Laterality: N/A;  . NASAL SINUS SURGERY  01/29/2012   Procedure: ENDOSCOPIC SINUS  SURGERY WITH STEALTH;  Surgeon: Ruby Cola, MD;  Location: [facility];  Service: ENT;  Laterality: N/A;  . NM MYOVIEW LTD  08/25/2011   Low risk study, no evidence of ischemia, EF 44%  . REFRACTIVE SURGERY  ~ 2010   left  . RIGHT/LEFT HEART CATH AND CORONARY ANGIOGRAPHY N/A 07/30/2017   Procedure: RIGHT/LEFT HEART CATH AND CORONARY ANGIOGRAPHY;  Surgeon: Martinique, Peter M, MD;  Location: [facility];  Service: Cardiovascular;  Laterality: N/A;  . SPHENOIDECTOMY  01/29/2012   Procedure: SPHENOIDECTOMY;  Surgeon: Ruby Cola, MD;  Location: [facility];  Service: ENT;  Laterality: Left;  REPAIR OF LEFT SPHENOID    . TOTAL ABDOMINAL HYSTERECTOMY  1994    Social History   Socioeconomic History  . Marital status: Single    Spouse name: Not on file  . Number of children: 0  . Years of education: Not on file  . Highest education level: Not on file  Occupational History  . Occupation: disabled  Social Needs  . Financial resource strain: Not on file  . Food insecurity:    Worry: Not on file    Inability: Not on file  . Transportation needs:    Medical: Not on file    Non-medical: Not on file  Tobacco Use  . Smoking status: Current Every Day Smoker    Packs/day: 0.50    Years: 35.00    Pack years: 17.50    Types: Cigarettes  . Smokeless tobacco: Never Used  . Tobacco comment: 07/26/2012 has decreased smoking from 2ppd; offered cessation materials; pt declines  Substance and Sexual Activity  . Alcohol use: Yes    Alcohol/week: 1.0 standard drinks    Types: 1 Glasses of wine per week    Comment: rarely  . Drug use: No  . Sexual activity: Never    Birth control/protection: Surgical  Lifestyle  . Physical activity:    Days per week: Not on file    Minutes per session: Not on file  . Stress: Not on file  Relationships  . Social connections:    Talks on phone: Not on file    Gets together: Not on file    Attends religious service: Not on file    Active member of club or  organization: Not on file    Attends meetings of clubs or organizations: Not on file    Relationship status: Not on file  Other Topics Concern  . Not on file  Social History Narrative   Currently unemployed.  Really wants to find a job, but has been difficult for her.  No children, not married    Family History  Problem Relation Age of Onset  . Colon cancer Sister 92  . Breast cancer Mother 50  . Alzheimer's disease Mother   . Diabetes Sister 100  . Heart failure Sister   .  Diabetes Sister 42  . Lung cancer Father   . Breast cancer Sister   . Cancer Sister        mouth  . Anesthesia problems Neg Hx   . Hypotension Neg Hx   . Malignant hyperthermia Neg Hx   . Pseudochol deficiency Neg Hx     Health Maintenance  Topic Date Due  . PAP SMEAR-Modifier  04/26/1978  . OPHTHALMOLOGY EXAM  03/04/2014  . FOOT EXAM  06/20/2015  . MAMMOGRAM  06/25/2018  . INFLUENZA VACCINE  07/08/2018  . HIV Screening  04/01/2019 (Originally 04/26/1972)  . HEMOGLOBIN A1C  12/31/2018  . COLONOSCOPY  10/09/2019  . TETANUS/TDAP  03/29/2025  . PNEUMOCOCCAL POLYSACCHARIDE VACCINE AGE 64-64 HIGH RISK  Completed  . Hepatitis C Screening  Completed    ----------------------------------------------------------------------------------------------------------------------------------------------------------------------------------------------------------------- Physical Exam BP (!) 148/76   Pulse 79   Temp 97.9 F (36.6 C) (Oral)   Ht 5' 3.5" (1.613 m)   Wt 237 lb (107.5 kg)   SpO2 96%   BMI 41.32 kg/m   Physical Exam Constitutional:      Appearance: She is obese.  HENT:     Head: Normocephalic and atraumatic.     Mouth/Throat:     Mouth: Mucous membranes are moist.  Eyes:     General: No scleral icterus. Neck:     Musculoskeletal: Neck supple.  Cardiovascular:     Rate and Rhythm: Normal rate and regular rhythm.  Pulmonary:     Effort: Pulmonary effort is normal.     Breath sounds: Normal  breath sounds.  Musculoskeletal:     Right lower leg: No edema.     Left lower leg: No edema.  Skin:    General: Skin is warm and dry.  Neurological:     General: No focal deficit present.     Mental Status: She is alert.  Psychiatric:        Mood and Affect: Mood normal.        Behavior: Behavior normal.     Comments: Appropriately tearful when discussing passing of her sister.     ------------------------------------------------------------------------------------------------------------------------------------------------------------------------------------------------------------------- Assessment and Plan  Essential hypertension -BP is a little elevated today but recently restarted medications -Encouraged to check at home -Follow low salt diet   Type 2 diabetes mellitus with stage 3 chronic kidney disease, with long-term current use of insulin ([place]) -Update a1c today -Reviewed low carb diet again -Try to incorporate exercise, may help with stress mgmt as well -Follows with nephrology for CKD -Flu vaccine given.  Moderate episode of recurrent major depressive disorder ([place]) -Some increased symptoms due to death of her sister.  Expressed condolences.  Overall doing well with current medication. -Discussed grief counseling, she will consider.   Nicotine dependence, cigarettes, uncomplicated -Briefly discussed smoking cessation -Not ready to quit at this time.

## 2018-11-18 NOTE — Assessment & Plan Note (Signed)
-  Briefly discussed smoking cessation -Not ready to quit at this time.

## 2018-11-18 NOTE — Assessment & Plan Note (Signed)
-  Update a1c today -Reviewed low carb diet again -Try to incorporate exercise, may help with stress mgmt as well -Follows with nephrology for CKD -Flu vaccine given.

## 2018-11-21 ENCOUNTER — Other Ambulatory Visit: Payer: Self-pay | Admitting: Physician Assistant

## 2018-11-21 ENCOUNTER — Other Ambulatory Visit: Payer: Self-pay | Admitting: Cardiology

## 2018-11-21 DIAGNOSIS — I471 Supraventricular tachycardia: Secondary | ICD-10-CM

## 2018-11-22 ENCOUNTER — Encounter: Payer: Self-pay | Admitting: Podiatry

## 2018-11-22 ENCOUNTER — Ambulatory Visit (INDEPENDENT_AMBULATORY_CARE_PROVIDER_SITE_OTHER): Payer: Medicare Other | Admitting: Podiatry

## 2018-11-22 DIAGNOSIS — B351 Tinea unguium: Secondary | ICD-10-CM

## 2018-11-22 DIAGNOSIS — E1140 Type 2 diabetes mellitus with diabetic neuropathy, unspecified: Secondary | ICD-10-CM

## 2018-11-22 DIAGNOSIS — M79676 Pain in unspecified toe(s): Secondary | ICD-10-CM | POA: Diagnosis not present

## 2018-11-22 NOTE — Progress Notes (Signed)
-  A1c improved, continue to work on dietary changes for management of her blood sugars.  -Kidney function is stable.

## 2018-11-25 ENCOUNTER — Telehealth: Payer: Self-pay | Admitting: Physician Assistant

## 2018-11-25 NOTE — Telephone Encounter (Signed)
Pt called to let Dr Hilarie Fredrickson know that her sister, Mathilde Mcwherter passed away Mar 06, 2023.  FYI

## 2018-11-25 NOTE — Telephone Encounter (Signed)
Dr. Hilarie Fredrickson is aware.

## 2018-11-26 DIAGNOSIS — N183 Chronic kidney disease, stage 3 (moderate): Secondary | ICD-10-CM | POA: Diagnosis not present

## 2018-11-26 DIAGNOSIS — E785 Hyperlipidemia, unspecified: Secondary | ICD-10-CM | POA: Diagnosis not present

## 2018-11-26 DIAGNOSIS — I129 Hypertensive chronic kidney disease with stage 1 through stage 4 chronic kidney disease, or unspecified chronic kidney disease: Secondary | ICD-10-CM | POA: Diagnosis not present

## 2018-11-26 DIAGNOSIS — Z720 Tobacco use: Secondary | ICD-10-CM | POA: Diagnosis not present

## 2018-11-26 DIAGNOSIS — E1122 Type 2 diabetes mellitus with diabetic chronic kidney disease: Secondary | ICD-10-CM | POA: Diagnosis not present

## 2018-11-29 NOTE — Progress Notes (Signed)
Subjective: [name], 62 year old female, returns the office today for painful, elongated, thickened toenails which she cannot trim herself. Denies any redness or drainage around the nails. Denies any acute changes since last appointment and no new complaints today. Denies any systemic complaints such as fevers, chills, nausea, vomiting.   Her sister just recently passed away.  Objective: AAO 3, NAD DP/PT pulses palpable, CRT less than 3 seconds Sensation decreased with SWMF.  Nails hypertrophic, dystrophic, elongated, brittle, discolored 9. There is tenderness overlying the nails 1-5 bilaterally except the left hallux nail which has previously been removed. There is no surrounding erythema or drainage along the nail sites.  No open lesions or pre-ulcerative lesions are identified. No pain with calf compression, swelling, warmth, erythema.  Assessment: Patient presents with symptomatic onychomycosis; neuropathy  Plan: -Treatment options including alternatives, risks, complications were discussed -Nails sharply debrided 9 without complication/bleeding. She refuses debridement of left great toe retained spicules. -Discussed daily foot inspection. If there are any changes, to call the office immediately.  -Follow-up in 3 months or sooner if any problems are to arise. In the meantime, encouraged to call the office with any questions, concerns, changes symptoms.  Trula Slade DPM

## 2018-12-13 ENCOUNTER — Ambulatory Visit: Payer: Self-pay | Admitting: *Deleted

## 2018-12-13 NOTE — Telephone Encounter (Signed)
Two attempts made to call pt with no return call. Left message to return call to office to discuss symptoms. Will route to PCP for recommendations.

## 2018-12-13 NOTE — Telephone Encounter (Signed)
Summary: Continuous Cough   Pt stated she was sick all last week. She felt like she had the flu. Currently she has still has a continuous cough. She would like to know if he can call in a rx to help with the cough. Pleas advise. CB#[phone]

## 2018-12-13 NOTE — Telephone Encounter (Signed)
Needs OV.  

## 2018-12-14 NOTE — Telephone Encounter (Signed)
Left voicemail to return call. 

## 2018-12-17 ENCOUNTER — Other Ambulatory Visit: Payer: Self-pay | Admitting: Physician Assistant

## 2018-12-17 NOTE — Telephone Encounter (Signed)
LMOM for pt to call our office back about medication refill.

## 2018-12-17 NOTE — Telephone Encounter (Signed)
A follow up office visit needs to be scheduled before we can refill this medication again.   Richardson Dopp, PA-C    12/17/2018 1:52 PM

## 2018-12-17 NOTE — Telephone Encounter (Signed)
Pt is requesting refills on this medication and have not made an upcoming appt. Would you like to refill this medication? Please address, thank you.

## 2018-12-24 NOTE — Telephone Encounter (Signed)
LMOM for pt to call our office back about medication refill.

## 2018-12-29 ENCOUNTER — Encounter (HOSPITAL_COMMUNITY): Payer: Self-pay | Admitting: Emergency Medicine

## 2018-12-29 ENCOUNTER — Other Ambulatory Visit: Payer: Self-pay

## 2018-12-29 ENCOUNTER — Emergency Department (HOSPITAL_COMMUNITY)
Admission: EM | Admit: 2018-12-29 | Discharge: 2018-12-29 | Disposition: A | Discharge status: Home / Self Care | Payer: Medicare Other | Attending: Emergency Medicine | Admitting: Emergency Medicine

## 2018-12-29 DIAGNOSIS — Z7982 Long term (current) use of aspirin: Secondary | ICD-10-CM | POA: Diagnosis not present

## 2018-12-29 DIAGNOSIS — J449 Chronic obstructive pulmonary disease, unspecified: Secondary | ICD-10-CM | POA: Diagnosis not present

## 2018-12-29 DIAGNOSIS — R0902 Hypoxemia: Secondary | ICD-10-CM | POA: Diagnosis not present

## 2018-12-29 DIAGNOSIS — E1165 Type 2 diabetes mellitus with hyperglycemia: Secondary | ICD-10-CM | POA: Diagnosis not present

## 2018-12-29 DIAGNOSIS — Z79899 Other long term (current) drug therapy: Secondary | ICD-10-CM | POA: Diagnosis not present

## 2018-12-29 DIAGNOSIS — Z794 Long term (current) use of insulin: Secondary | ICD-10-CM | POA: Insufficient documentation

## 2018-12-29 DIAGNOSIS — R000 Tachycardia, unspecified: Secondary | ICD-10-CM

## 2018-12-29 DIAGNOSIS — E119 Type 2 diabetes mellitus without complications: Secondary | ICD-10-CM | POA: Diagnosis not present

## 2018-12-29 DIAGNOSIS — N183 Chronic kidney disease, stage 3 (moderate): Secondary | ICD-10-CM | POA: Insufficient documentation

## 2018-12-29 DIAGNOSIS — F17210 Nicotine dependence, cigarettes, uncomplicated: Secondary | ICD-10-CM | POA: Diagnosis not present

## 2018-12-29 DIAGNOSIS — I5032 Chronic diastolic (congestive) heart failure: Secondary | ICD-10-CM | POA: Diagnosis not present

## 2018-12-29 DIAGNOSIS — I471 Supraventricular tachycardia: Secondary | ICD-10-CM | POA: Diagnosis not present

## 2018-12-29 DIAGNOSIS — R002 Palpitations: Secondary | ICD-10-CM | POA: Diagnosis not present

## 2018-12-29 DIAGNOSIS — I130 Hypertensive heart and chronic kidney disease with heart failure and stage 1 through stage 4 chronic kidney disease, or unspecified chronic kidney disease: Secondary | ICD-10-CM | POA: Insufficient documentation

## 2018-12-29 LAB — COMPREHENSIVE METABOLIC PANEL
ALT: 15 U/L (ref 0–44)
AST: 19 U/L (ref 15–41)
Albumin: 3.5 g/dL (ref 3.5–5.0)
Alkaline Phosphatase: 84 U/L (ref 38–126)
Anion gap: 11 (ref 5–15)
BUN: 17 mg/dL (ref 8–23)
CO2: 27 mmol/L (ref 22–32)
Calcium: 8.6 mg/dL — ABNORMAL LOW (ref 8.9–10.3)
Chloride: 101 mmol/L (ref 98–111)
Creatinine, Ser: 1.31 mg/dL — ABNORMAL HIGH (ref 0.44–1.00)
GFR calc Af Amer: 51 mL/min — ABNORMAL LOW (ref >60)
GFR calc non Af Amer: 44 mL/min — ABNORMAL LOW (ref >60)
Glucose, Bld: 230 mg/dL — ABNORMAL HIGH (ref 70–99)
Potassium: 3.7 mmol/L (ref 3.5–5.1)
Sodium: 139 mmol/L (ref 135–145)
Total Bilirubin: 0.7 mg/dL (ref 0.3–1.2)
Total Protein: 6.7 g/dL (ref 6.5–8.1)

## 2018-12-29 LAB — CBC WITH DIFFERENTIAL/PLATELET
Abs Immature Granulocytes: 0.04 10*3/uL (ref 0.00–0.07)
Basophils Absolute: 0 10*3/uL (ref 0.0–0.1)
Basophils Relative: 0 %
Eosinophils Absolute: 0.3 10*3/uL (ref 0.0–0.5)
Eosinophils Relative: 3 %
HCT: 51.8 % — ABNORMAL HIGH (ref 36.0–46.0)
Hemoglobin: 16 g/dL — ABNORMAL HIGH (ref 12.0–15.0)
Immature Granulocytes: 0 %
Lymphocytes Relative: 21 %
Lymphs Abs: 2.2 10*3/uL (ref 0.7–4.0)
MCH: 27.4 pg (ref 26.0–34.0)
MCHC: 30.9 g/dL (ref 30.0–36.0)
MCV: 88.9 fL (ref 80.0–100.0)
Monocytes Absolute: 0.5 10*3/uL (ref 0.1–1.0)
Monocytes Relative: 5 %
Neutro Abs: 7.5 10*3/uL (ref 1.7–7.7)
Neutrophils Relative %: 71 %
Platelets: 226 10*3/uL (ref 150–400)
RBC: 5.83 MIL/uL — ABNORMAL HIGH (ref 3.87–5.11)
RDW: 14.9 % (ref 11.5–15.5)
WBC: 10.5 10*3/uL (ref 4.0–10.5)
nRBC: 0 % (ref 0.0–0.2)

## 2018-12-29 LAB — I-STAT TROPONIN, ED: Troponin i, poc: 0.02 ng/mL (ref 0.00–0.08)

## 2018-12-29 LAB — MAGNESIUM: Magnesium: 1.7 mg/dL (ref 1.7–2.4)

## 2018-12-29 MED ORDER — VERAPAMIL HCL 2.5 MG/ML IV SOLN
10.0000 mg | Freq: Once | INTRAVENOUS | Status: AC
  Administered 2018-12-29: 10 mg via INTRAVENOUS
  Filled 2018-12-29: qty 4

## 2018-12-29 NOTE — Discharge Instructions (Signed)
Your history and exam today are consistent with recurrent SVT.  We were able to get you out of it with the vagal maneuvers.  You demonstrated stability for several hours without further episodes.  Given your reassuring lab testing we feel you are safe for discharge home with extremely strict return precautions.  Please call and follow-up with your cardiologist in the neck several days.  If any symptoms change or worsen, please return to the nearest emergency department.

## 2018-12-29 NOTE — ED Triage Notes (Signed)
Pt in from home via [facility] with SVT - hx of same. States she has felt sob and palpitations since 0500 this am, tried vagal manuevers with no success. BP 118/67, has had to get Adenosine before

## 2018-12-29 NOTE — ED Provider Notes (Signed)
[facility] EMERGENCY DEPARTMENT Provider Note   CSN: [id number] Arrival date & time: 12/29/18  1042     History   Chief Complaint Chief Complaint  Patient presents with  . SVT    HPI [name] is a 63 y.o. female.  The history is provided by the patient, medical records and the EMS personnel.  Palpitations  Palpitations quality:  Fast Onset quality:  Sudden Duration:  6 hours Timing:  Constant Progression:  Unchanged Chronicity:  Recurrent Context: not stimulant use   Relieved by:  Nothing Worsened by:  Nothing Ineffective treatments:  Valsalva, deep relaxation and breathing exercises Associated symptoms: nausea and shortness of breath   Associated symptoms: no back pain, no chest pain, no chest pressure, no cough, no diaphoresis, no dizziness, no leg pain, no lower extremity edema, no numbness, no syncope and no vomiting     Past Medical History:  Diagnosis Date  . Anxiety    doesn't take any meds for this  . Arteriosclerosis of coronary artery 08/31/2011   Myoview 9/12: EF 44, no ischemia // Myoview 8/18: EF 57, low risk, possible small area of inferior ischemia // Minor nonobstructive CAD by cardiac catheterization - LHC 8/18: Proximal LAD 35, OM2 25  . Arthritis    knuckles  . Asthma   . Carotid artery disease ([place])    Carotid US 7/14: Bilateral ICA 40-59 // Carotid US ([facility]) 2/17: bilat plaque without sig stenosis  . Chronic diastolic CHF (congestive heart failure) ([place]) 07/26/2017   Echo 7/18:Severe conc LVH, EF 60-65, no RWMA, Gr 2 DD, MAC, mild MR // right and left heart cath 8/18: LVEDP 21  . Claustrophobia 07/26/2012  . COPD (chronic obstructive pulmonary disease) ([place])    "chronic bronchitis - about every winter"  . CSF leak    dizziness/headache - assoc symptoms // 2/2 encephalocele s/p skull base reconstruction  . Diverticulosis   . History of CVA (cerebrovascular accident) 03/2004   denies residual 07/26/2012  . Hx of  gout   . Hyperlipidemia    takes Pravastatin daily  . Hypertensive heart disease with chronic diastolic congestive heart failure ([place]) 07/26/2017  . Internal hemorrhoids   . Kidney stones   . LVH (left ventricular hypertrophy)    Echo 8/18: Severe conc LVH, EF 60-65, no RWMA, Gr 2 DD, MAC, mild MR  . OSA (obstructive sleep apnea) 07/26/2012   prior CPAP - stopped due to being primary caregiver for dying parent (stopped in 2016)  . PSVT (paroxysmal supraventricular tachycardia) ([place])   . Pulmonary hypertension, unspecified ([place]) 08/14/2017   Right heart cath 8/13:RVSP 66, PASP 57, mean PA 39, mean PWCP 18, LVEDP 21  . Type II diabetes mellitus [facility])     Patient Active Problem List   Diagnosis Date Noted  . Type 2 diabetes mellitus with stage 3 chronic kidney disease, with long-term current use of insulin ([place]) 11/18/2018  . Acute intractable headache 06/30/2018  . Screening for breast cancer 06/30/2018  . Nicotine dependence, cigarettes, uncomplicated 82/95/6213  . Risk for falls 09/14/2017  . Pulmonary hypertension, unspecified ([place]) 08/14/2017  . Hypertensive heart disease with chronic diastolic congestive heart failure ([place]) 07/26/2017  . Paroxysmal SVT (supraventricular tachycardia) ([place]) 07/26/2017  . COPD  GOLD 0 still smoking 07/13/2017  . Urinary urgency 02/04/2017  . Moderate episode of recurrent major depressive disorder ([place]) 07/12/2015  . OSA (obstructive sleep apnea) 10/17/2014  . Ingrown toenail without infection 09/02/2014  . Pain in joint, multiple  sites 09/21/2013  . Carotid artery disease ([place]) 06/03/2013  . Dizziness 02/16/2013  . Depression 12/12/2012  . Weakness of left side of body 07/26/2012  . Uric acid kidney stone 07/04/2012  . CSF leak from nose 10/19/2011  . Arteriosclerosis of coronary artery 08/31/2011  . Type 2 diabetes mellitus with diabetic polyneuropathy, with long-term current use of insulin ([place]) 08/31/2011  . HLD (hyperlipidemia) 07/02/2011  .  Essential hypertension 06/20/2011  . Morbid obesity due to excess calories ([place]) 06/20/2011    Past Surgical History:  Procedure Laterality Date  . ESOPHAGOGASTRODUODENOSCOPY (EGD) WITH PROPOFOL N/A 07/13/2015   Procedure: ESOPHAGOGASTRODUODENOSCOPY (EGD) WITH PROPOFOL;  Surgeon: Carol Ada, MD;  Location: [facility];  Service: Endoscopy;  Laterality: N/A;  . NASAL SINUS SURGERY  01/29/2012   Procedure: ENDOSCOPIC SINUS SURGERY WITH STEALTH;  Surgeon: Ruby Cola, MD;  Location: [facility];  Service: ENT;  Laterality: N/A;  . NM MYOVIEW LTD  08/25/2011   Low risk study, no evidence of ischemia, EF 44%  . REFRACTIVE SURGERY  ~ 2010   left  . RIGHT/LEFT HEART CATH AND CORONARY ANGIOGRAPHY N/A 07/30/2017   Procedure: RIGHT/LEFT HEART CATH AND CORONARY ANGIOGRAPHY;  Surgeon: Martinique, Peter M, MD;  Location: [facility] CV [facility];  Service: Cardiovascular;  Laterality: N/A;  . SPHENOIDECTOMY  01/29/2012   Procedure: SPHENOIDECTOMY;  Surgeon: Ruby Cola, MD;  Location: [facility];  Service: ENT;  Laterality: Left;  REPAIR OF LEFT SPHENOID    . TOTAL ABDOMINAL HYSTERECTOMY  1994     OB History    Gravida  0   Para      Term      Preterm      AB      Living        SAB      TAB      Ectopic      Multiple      Live Births               Home Medications    Prior to Admission medications   Medication Sig Start Date End Date Taking? Authorizing Provider  acetaminophen (TYLENOL) 325 MG tablet Take 2 tablets (650 mg total) by mouth every 6 (six) hours as needed for mild pain. 06/30/18   Luetta Nutting, DO  allopurinol (ZYLOPRIM) 100 MG tablet  06/24/18   [provider]  aspirin EC 81 MG tablet Take 1 tablet (81 mg total) by mouth daily. 07/13/17   Richardson Dopp T, PA-C  Blood Glucose Monitoring Suppl (ACCU-CHEK AVIVA PLUS) w/Device KIT Patient is to test four times daily. Dx. E11.42 11/03/18   Luetta Nutting, DO  busPIRone (BUSPAR) 15 MG tablet TAKE 15 MG BY MOUTH 3 TIMES A  DAY 06/17/16   [provider]  butalbital-acetaminophen-caffeine (FIORICET, ESGIC) 50-325-40 MG tablet Take 1 tablet by mouth every 6 (six) hours as needed for headache. Do not take with tylenol 07/02/18 07/02/19  Luetta Nutting, DO  dicyclomine (BENTYL) 20 MG tablet TAKE 1 TABLET 20-30 MINUTES BEFORE MEALS IF YOU ARE EATING, OTHERWISE JUST TAKE FOUR TIMES DAILY. 11/17/18   Pyrtle, Lajuan Lines, MD  FLUoxetine (PROZAC) 10 MG capsule Take 4 capsules (40 mg total) by mouth at bedtime. 11/24/13   Losq, Burnell Blanks, MD  furosemide (LASIX) 40 MG tablet Take 1 tablet (40 mg total) by mouth daily. 03/31/18 06/29/18  Luetta Nutting, DO  glucose blood (ACCU-CHEK AVIVA) test strip Patient is to test four times daily. Dx. I09.73 11/03/18   Zigmund Daniel,  [place], DO  insulin aspart (NOVOLOG FLEXPEN) 100 UNIT/ML FlexPen Inject 30-40 Units into the skin 3 (three) times daily with meals. 03/31/18   Luetta Nutting, DO  Insulin Glargine (TOUJEO SOLOSTAR) 300 UNIT/ML SOPN Inject 110 Units into the skin 2 (two) times daily. Patient taking differently: Inject 120 Units into the skin 2 (two) times daily.  03/31/18   Luetta Nutting, DO  isosorbide mononitrate (IMDUR) 60 MG 24 hr tablet Take 1 tablet (60 mg total) by mouth daily. ** DO NOT CRUSH ** 11/22/18 12/22/18  Richardson Dopp T, PA-C  losartan (COZAAR) 25 MG tablet Take 25 mg by mouth daily. 05/26/18   [provider]  mometasone-formoterol (DULERA) 100-5 MCG/ACT AERO Take 2 puffs first thing in am and then another 2 puffs about 12 hours later. 03/10/18   Tanda Rockers, MD  nitroGLYCERIN (NITROSTAT) 0.4 MG SL tablet Place 1 tablet (0.4 mg total) under the tongue every 5 (five) minutes as needed. 07/27/17   Richardson Dopp T, PA-C  omeprazole (PRILOSEC) 40 MG capsule Take 30- 60 min before your first and last meals of the day 01/13/18   Tanda Rockers, MD  ondansetron (ZOFRAN) 4 MG tablet Take 1 tablet (4 mg total) by mouth every 8 (eight) hours as needed for nausea or vomiting.  07/02/18   Luetta Nutting, DO  potassium chloride SA (KLOR-CON M20) 20 MEQ tablet Take 1 tablet (20 mEq total) by mouth daily. Please make overdue appt with Dr. Harrington Challenger before anymore refills. 1st attempt 10/12/18   Richardson Dopp T, PA-C  pregabalin (LYRICA) 150 MG capsule Take 1 capsule (150 mg total) by mouth 2 (two) times daily. 06/30/18   Luetta Nutting, DO  rosuvastatin (CRESTOR) 20 MG tablet Take 1 tablet (20 mg total) by mouth daily. 04/29/18 07/28/18  Luetta Nutting, DO  topiramate (TOPAMAX) 25 MG tablet Take 25 mg by mouth twice daily 11/25/12   [provider]  valsartan (DIOVAN) 80 MG tablet Take by mouth. 05/20/17   [provider]  Verapamil HCl CR 300 MG CP24 Take 1 capsule (300 mg total) by mouth daily. Please call to schedule overdue appt with Dr Harrington Challenger for future refills. (3rd attempt) 10/19/18   Isaiah Serge, NP    Family History Family History  Problem Relation Age of Onset  . Colon cancer Sister 41  . Breast cancer Mother 9  . Alzheimer's disease Mother   . Diabetes Sister 47  . Heart failure Sister   . Diabetes Sister 38  . Lung cancer Father   . Breast cancer Sister   . Cancer Sister        mouth  . Anesthesia problems Neg Hx   . Hypotension Neg Hx   . Malignant hyperthermia Neg Hx   . Pseudochol deficiency Neg Hx     Social History Social History   Tobacco Use  . Smoking status: Current Every Day Smoker    Packs/day: 0.50    Years: 35.00    Pack years: 17.50    Types: Cigarettes  . Smokeless tobacco: Never Used  . Tobacco comment: 07/26/2012 has decreased smoking from 2ppd; offered cessation materials; pt declines  Substance Use Topics  . Alcohol use: Yes    Alcohol/week: 1.0 standard drinks    Types: 1 Glasses of wine per week    Comment: rarely  . Drug use: No     Allergies   Atorvastatin; Ibuprofen; Metformin and related; Ace inhibitors; Hydrocodone-acetaminophen; and Albuterol   Review of Systems Review  of Systems    Constitutional: Positive for fatigue. Negative for chills, diaphoresis and fever.  HENT: Negative for congestion, nosebleeds and rhinorrhea.   Eyes: Negative for visual disturbance.  Respiratory: Positive for chest tightness and shortness of breath. Negative for cough, wheezing and stridor.   Cardiovascular: Positive for palpitations. Negative for chest pain, leg swelling and syncope.  Gastrointestinal: Positive for nausea. Negative for abdominal pain, constipation, diarrhea and vomiting.  Genitourinary: Negative for dysuria.  Musculoskeletal: Negative for back pain, neck pain and neck stiffness.  Skin: Negative for rash and wound.  Neurological: Positive for light-headedness. Negative for dizziness, numbness and headaches.  Psychiatric/Behavioral: Negative for agitation and confusion.  All other systems reviewed and are negative.    Physical Exam Updated Vital Signs Temp (!) 97.3 F (36.3 C) (Oral)   Wt 107.5 kg   SpO2 94%   BMI 41.32 kg/m   Physical Exam Vitals signs and nursing note reviewed.  Constitutional:      General: She is not in acute distress.    Appearance: She is well-developed. She is not ill-appearing, toxic-appearing or diaphoretic.  HENT:     Head: Normocephalic and atraumatic.     Right Ear: External ear normal. There is no impacted cerumen.     Left Ear: External ear normal. There is no impacted cerumen.     Nose: Nose normal. No congestion or rhinorrhea.     Mouth/Throat:     Mouth: Mucous membranes are moist.     Pharynx: No oropharyngeal exudate.  Eyes:     Conjunctiva/sclera: Conjunctivae normal.     Pupils: Pupils are equal, round, and reactive to light.  Neck:     Musculoskeletal: Normal range of motion and neck supple.  Cardiovascular:     Rate and Rhythm: Regular rhythm. Tachycardia present.     Pulses: Normal pulses.     Heart sounds: No murmur.  Pulmonary:     Effort: Pulmonary effort is normal. No respiratory distress.     Breath  sounds: Normal breath sounds. No stridor. No wheezing, rhonchi or rales.  Chest:     Chest wall: No tenderness.  Abdominal:     General: Abdomen is flat. There is no distension.     Tenderness: There is no abdominal tenderness. There is no rebound.  Musculoskeletal:        General: No tenderness.     Right lower leg: No edema.     Left lower leg: No edema.  Skin:    General: Skin is warm.     Capillary Refill: Capillary refill takes less than 2 seconds.     Findings: No erythema or rash.  Neurological:     General: No focal deficit present.     Mental Status: She is alert and oriented to person, place, and time.     Motor: No abnormal muscle tone.     Coordination: Coordination normal.     Deep Tendon Reflexes: Reflexes are normal and symmetric.  Psychiatric:        Mood and Affect: Mood normal.      ED Treatments / Results  Labs (all labs ordered are listed, but only abnormal results are displayed) Labs Reviewed  CBC WITH DIFFERENTIAL/PLATELET - Abnormal; Notable for the following components:      Result Value   RBC 5.83 (*)    Hemoglobin 16.0 (*)    HCT 51.8 (*)    All other components within normal limits  COMPREHENSIVE METABOLIC PANEL - Abnormal; Notable for the  following components:   Glucose, Bld 230 (*)    Creatinine, Ser 1.31 (*)    Calcium 8.6 (*)    GFR calc non Af Amer 44 (*)    GFR calc Af Amer 51 (*)    All other components within normal limits  MAGNESIUM  I-STAT TROPONIN, ED    EKG EKG Interpretation  Date/Time:  Wednesday December 29 2018 10:53:51 EST Ventricular Rate:  92 PR Interval:    QRS Duration: 93 QT Interval:  394 QTC Calculation: 488 R Axis:   -115 Text Interpretation:  Sinus rhythm Left anterior fascicular block Consider right ventricular hypertrophy Borderline prolonged QT interval when compared to ECG earlier, now sinus rhythm from SVT.  No STEMI Confirmed by Antony Blackbird [phone]) on 12/29/2018 10:56:20 AM   Radiology No results  found.  Procedures Procedures (including critical care time)  Medications Ordered in ED Medications  verapamil (ISOPTIN) injection 10 mg (10 mg Intravenous Given 12/29/18 1110)     Initial Impression / Assessment and Plan / ED Course  I have reviewed the triage vital signs and the nursing notes.  Pertinent labs & imaging results that were available during my care of the patient were reviewed by me and considered in my medical decision making (see chart for details).     SHAMARRA WARDA is a 63 y.o. female with a past medical history significant for recurrent SVT, CAD, diabetes, asthma, hypertension, hyperlipidemia, stroke, CHF, and anxiety who presents with palpitations and SVT.  Patient reports that at 5 in his morning she felt herself go into SVT again and presents for management.  She says that typically they give her adenosine to stop her heart and have it restarted normally.  She denies any chest pain at this time and has some mild shortness of breath with it.  She reports continued palpitations.  Patient tried vagal movers with EMS and at home without success.  Patient presents for evaluation.  She reports that she did take her verapamil oral medication this morning but did not take her other medicines.  She denies other complaints.  Specifically, she denies any recent fevers, chills, chest pain, palpitations, shortness of breath, or cough.  No urinary or GI symptoms.  Patient resting comfortably with a heart rate of 160 on my initial arrival.  On exam, lungs are clear.  Chest is nontender.  Abdomen is nontender.  Patient has symmetric radial pulses.  Mild edema legs but no tenderness.  EKG shows SVT.  This is consistent with her prior episodes.  She denies any history of A. fib or a flutter.  Vagal mover was attempted at bedside and patient converted to sinus rhythm.  This happened twice more where she was given vagal maneuvers with bearing down and subsequent leg raising causing her  to go back to sinus rhythm.  Patient will have laboratory testing to look for electrolyte imbalance or other abnormality which may contribute to persistent SVT.  She will also be given an IV dose of her verapamil to help prevent it from further SVT.  If work-up is reassuring, dissipate discharge home however may end up touching base with cardiology if she continues to go into SVT.  1:56 PM Patient's laboratory testing was reassuring.  No evidence of new hypokalemia or magnesium abnormality.  Troponin negative.  Patient demonstrate stability for several hours with no recurrent episodes of SVT.  Patient was feeling much better and was able to ambulate without tachycardia.  Given well appearance and resolution of  his SVT, she was feel stable for discharge home with close cardiology follow-up.  Patient understood strict return precautions for return.  Patient other questions or concerns and was discharged in good condition with resolution of SVT and symptoms.  Final Clinical Impressions(s) / ED Diagnoses   Final diagnoses:  SVT (supraventricular tachycardia) ([place])  Tachycardia    ED Discharge Orders    None      Clinical Impression: 1. SVT (supraventricular tachycardia) ([place])   2. Tachycardia     Disposition: Discharge  Condition: Good  I have discussed the results, Dx and Tx plan with the pt(& family if present). He/she/they expressed understanding and agree(s) with the plan. Discharge instructions discussed at great length. Strict return precautions discussed and pt &/or family have verbalized understanding of the instructions. No further questions at time of discharge.    New Prescriptions   No medications on file    Follow Up: Luetta Nutting, DO [address] [id number] [phone]     [facility] EMERGENCY DEPARTMENT [address] [id number] [place] [phone]    your  cardiologist        Tegeler, Gwenyth Allegra, MD 12/29/18 1910

## 2018-12-29 NOTE — ED Notes (Signed)
Patient verbalizes understanding of discharge instructions. Opportunity for questioning and answers were provided. Armband removed by staff, pt discharged from ED. Wheeled out to lobby  

## 2019-01-05 ENCOUNTER — Encounter: Payer: Self-pay | Admitting: Family Medicine

## 2019-01-05 ENCOUNTER — Ambulatory Visit (INDEPENDENT_AMBULATORY_CARE_PROVIDER_SITE_OTHER): Payer: Medicare Other

## 2019-01-05 ENCOUNTER — Ambulatory Visit (INDEPENDENT_AMBULATORY_CARE_PROVIDER_SITE_OTHER): Payer: Medicare Other | Admitting: Family Medicine

## 2019-01-05 VITALS — BP 144/72 | HR 74 | Temp 98.0°F | Ht 63.5 in | Wt 231.0 lb

## 2019-01-05 DIAGNOSIS — J449 Chronic obstructive pulmonary disease, unspecified: Secondary | ICD-10-CM | POA: Insufficient documentation

## 2019-01-05 DIAGNOSIS — J441 Chronic obstructive pulmonary disease with (acute) exacerbation: Secondary | ICD-10-CM | POA: Diagnosis not present

## 2019-01-05 DIAGNOSIS — R0602 Shortness of breath: Secondary | ICD-10-CM

## 2019-01-05 DIAGNOSIS — R059 Cough, unspecified: Secondary | ICD-10-CM

## 2019-01-05 DIAGNOSIS — R05 Cough: Secondary | ICD-10-CM | POA: Diagnosis not present

## 2019-01-05 LAB — BASIC METABOLIC PANEL
BUN: 13 mg/dL (ref 6–23)
CO2: 31 mEq/L (ref 19–32)
Calcium: 9.3 mg/dL (ref 8.4–10.5)
Chloride: 100 mEq/L (ref 96–112)
Creatinine, Ser: 1.15 mg/dL (ref 0.40–1.20)
GFR: 47.86 mL/min — ABNORMAL LOW (ref >60.00)
Glucose, Bld: 155 mg/dL — ABNORMAL HIGH (ref 70–99)
Potassium: 4 mEq/L (ref 3.5–5.1)
Sodium: 141 mEq/L (ref 135–145)

## 2019-01-05 LAB — CBC WITH DIFFERENTIAL/PLATELET
Basophils Absolute: 0 10*3/uL (ref 0.0–0.1)
Basophils Relative: 0.4 % (ref 0.0–3.0)
Eosinophils Absolute: 0.2 10*3/uL (ref 0.0–0.7)
Eosinophils Relative: 2.3 % (ref 0.0–5.0)
HCT: 48.4 % — ABNORMAL HIGH (ref 36.0–46.0)
Hemoglobin: 16 g/dL — ABNORMAL HIGH (ref 12.0–15.0)
Lymphocytes Relative: 20.5 % (ref 12.0–46.0)
Lymphs Abs: 1.7 10*3/uL (ref 0.7–4.0)
MCHC: 33.1 g/dL (ref 30.0–36.0)
MCV: 87.7 fl (ref 78.0–100.0)
Monocytes Absolute: 0.4 10*3/uL (ref 0.1–1.0)
Monocytes Relative: 4.9 % (ref 3.0–12.0)
Neutro Abs: 6 10*3/uL (ref 1.4–7.7)
Neutrophils Relative %: 71.9 % (ref 43.0–77.0)
Platelets: 215 10*3/uL (ref 150.0–400.0)
RBC: 5.52 Mil/uL — ABNORMAL HIGH (ref 3.87–5.11)
RDW: 16.1 % — ABNORMAL HIGH (ref 11.5–15.5)
WBC: 8.3 10*3/uL (ref 4.0–10.5)

## 2019-01-05 MED ORDER — LEVALBUTEROL TARTRATE 45 MCG/ACT IN AERO: 1.0000 | INHALATION_SPRAY | Freq: Four times a day (QID) | RESPIRATORY_TRACT | 3 refills | Status: DC | PRN

## 2019-01-05 MED ORDER — DOXYCYCLINE HYCLATE 100 MG PO TABS: 100.0000 mg | ORAL_TABLET | Freq: Two times a day (BID) | ORAL | 0 refills | Status: DC

## 2019-01-05 MED ORDER — PREDNISONE 20 MG PO TABS: 40.0000 mg | ORAL_TABLET | Freq: Every day | ORAL | 0 refills | Status: AC

## 2019-01-05 NOTE — Assessment & Plan Note (Signed)
-  Will try xopenex prn given previous intolerance to albuterol and episode of SVT.  -Start prednisone burst and doxycycline.  Instructed to monitor blood sugars closely.  -CXR ordered.  -Recommend that she follow up with pulmonologist as well.

## 2019-01-05 NOTE — Patient Instructions (Signed)
Please schedule a follow up appt with your lung doctor as well.    Chronic Obstructive Pulmonary Disease Exacerbation Chronic obstructive pulmonary disease (COPD) is a long-term (chronic) lung problem. In COPD, the flow of air from the lungs is limited. COPD exacerbations are times that breathing gets worse and you need more than your normal treatment. Without treatment, they can be life threatening. If they happen often, your lungs can become more damaged. If your COPD gets worse, your doctor may treat you with:  Medicines.  Oxygen.  Different ways to clear your airway, such as using a mask. Follow these instructions at home: Medicines  Take over-the-counter and prescription medicines only as told by your doctor.  If you take an antibiotic or steroid medicine, do not stop taking the medicine even if you start to feel better.  Keep up with shots (vaccinations) as told by your doctor. Be sure to get a yearly (annual) flu shot. Lifestyle  Do not smoke. If you need help quitting, ask your doctor.  Eat healthy foods.  Exercise regularly.  Get plenty of sleep.  Avoid tobacco smoke and other things that can bother your lungs.  Wash your hands often with soap and water. This will help keep you from getting an infection. If you cannot use soap and water, use hand sanitizer.  During flu season, avoid areas that are crowded with people. General instructions  Drink enough fluid to keep your pee (urine) clear or pale yellow. Do not do this if your doctor has told you not to.  Use a cool mist machine (vaporizer).  If you use oxygen or a machine that turns medicine into a mist (nebulizer), continue to use it as told.  Follow all instructions for rehabilitation. These are steps you can take to make your body work better.  Keep all follow-up visits as told by your doctor. This is important. Contact a doctor if:  Your COPD symptoms get worse than normal. Get help right away if:  You  are short of breath and it gets worse.  You have trouble talking.  You have chest pain.  You cough up blood.  You have a fever.  You keep throwing up (vomiting).  You feel weak or you pass out (faint).  You feel confused.  You are not able to sleep because of your symptoms.  You are not able to do daily activities. Summary  COPD exacerbations are times that breathing gets worse and you need more treatment than normal.  COPD exacerbations can be very serious and may cause your lungs to become more damaged.  Do not smoke. If you need help quitting, ask your doctor.  Stay up-to-date on your shots. Get a flu shot every year. This information is not intended to replace advice given to you by your health care provider. Make sure you discuss any questions you have with your health care provider. Document Released: 11/13/2011 Document Revised: 12/29/2016 Document Reviewed: 12/29/2016 Elsevier Interactive Patient Education  2019 [employer].

## 2019-01-05 NOTE — Progress Notes (Signed)
[name] - 63 y.o. female MRN [id number]  Date of birth: Oct 03, 1956  Subjective Chief Complaint  Patient presents with  . Cough    has not improved-has been ongoing for three weeks. Denies fevers    HPI [name] is a 63 y.o. female with history of HTN, T2DM, CVA, CAD with CHF and COPD here today with complaint of cough with wheezing and shortness of breath.  She reports having febrile illness with flu like symptoms about 2-3 weeks ago.  She felt like she was getting better but then started to feel worse again this week.  She is not using any inhalers due to her episodes of SVT as she believes albuterol has triggered this in the past.  She was recently seen in ED for episode of SVT that resolved with vagal maneuvers.  She denies recent fever, chills, or chest pain.  She has had some abdominal pain from abdomen being pressed on during vagal maneuvers.  Her weight has been stable and is actually down from previous visit.    ROS:  A comprehensive ROS was completed and negative except as noted per HPI  Allergies  Allergen Reactions  . Atorvastatin Other (See Comments) and Nausea And Vomiting    "legs cramped; moderate to almost severe" "legs cramped; moderate to almost severe"  . Ibuprofen Other (See Comments)    Other reaction(s): Other (See Comments) Per pt, MD doesn't want pt to take Per pt, MD doesn't want pt to take  . Metformin And Related Nausea And Vomiting  . Ace Inhibitors Cough and Nausea And Vomiting  . Hydrocodone-Acetaminophen Nausea And Vomiting  . Albuterol Other (See Comments)    Caused SVT    Past Medical History:  Diagnosis Date  . Anxiety    doesn't take any meds for this  . Arteriosclerosis of coronary artery 08/31/2011   Myoview 9/12: EF 44, no ischemia // Myoview 8/18: EF 57, low risk, possible small area of inferior ischemia // Minor nonobstructive CAD by cardiac catheterization - LHC 8/18: Proximal LAD 35, OM2 25  . Arthritis    knuckles  .  Asthma   . Carotid artery disease ([place])    Carotid US 7/14: Bilateral ICA 40-59 // Carotid US ([facility]) 2/17: bilat plaque without sig stenosis  . Chronic diastolic CHF (congestive heart failure) ([place]) 07/26/2017   Echo 7/18:Severe conc LVH, EF 60-65, no RWMA, Gr 2 DD, MAC, mild MR // right and left heart cath 8/18: LVEDP 21  . Claustrophobia 07/26/2012  . COPD (chronic obstructive pulmonary disease) ([place])    "chronic bronchitis - about every winter"  . CSF leak    dizziness/headache - assoc symptoms // 2/2 encephalocele s/p skull base reconstruction  . Diverticulosis   . History of CVA (cerebrovascular accident) 03/2004   denies residual 07/26/2012  . Hx of gout   . Hyperlipidemia    takes Pravastatin daily  . Hypertensive heart disease with chronic diastolic congestive heart failure ([place]) 07/26/2017  . Internal hemorrhoids   . Kidney stones   . LVH (left ventricular hypertrophy)    Echo 8/18: Severe conc LVH, EF 60-65, no RWMA, Gr 2 DD, MAC, mild MR  . OSA (obstructive sleep apnea) 07/26/2012   prior CPAP - stopped due to being primary caregiver for dying parent (stopped in 2016)  . PSVT (paroxysmal supraventricular tachycardia) ([place])   . Pulmonary hypertension, unspecified ([place]) 08/14/2017   Right heart cath 8/13:RVSP 66, PASP 57, mean PA 39, mean PWCP 18, LVEDP  21  . Type II diabetes mellitus ([place])     Past Surgical History:  Procedure Laterality Date  . ESOPHAGOGASTRODUODENOSCOPY (EGD) WITH PROPOFOL N/A 07/13/2015   Procedure: ESOPHAGOGASTRODUODENOSCOPY (EGD) WITH PROPOFOL;  Surgeon: Carol Ada, MD;  [facility];  Service: Endoscopy;  Laterality: N/A;  . NASAL SINUS SURGERY  01/29/2012   Procedure: ENDOSCOPIC SINUS SURGERY WITH STEALTH;  Surgeon: Ruby Cola, MD;  Location: [place];  Service: ENT;  Laterality: N/A;  . NM MYOVIEW LTD  08/25/2011   Low risk study, no evidence of ischemia, EF 44%  . REFRACTIVE SURGERY  ~ 2010   left  . RIGHT/LEFT HEART CATH AND CORONARY  ANGIOGRAPHY N/A 07/30/2017   Procedure: RIGHT/LEFT HEART CATH AND CORONARY ANGIOGRAPHY;  Surgeon: Martinique, Peter M, MD;  Location: [facility];  Service: Cardiovascular;  Laterality: N/A;  . SPHENOIDECTOMY  01/29/2012   Procedure: SPHENOIDECTOMY;  Surgeon: Ruby Cola, MD;  Location: [place];  Service: ENT;  Laterality: Left;  REPAIR OF LEFT SPHENOID    . TOTAL ABDOMINAL HYSTERECTOMY  1994    Social History   Socioeconomic History  . Marital status: Single    Spouse name: Not on file  . Number of children: 0  . Years of education: Not on file  . Highest education level: Not on file  Occupational History  . Occupation: disabled  Social Needs  . Financial resource strain: Not on file  . Food insecurity:    Worry: Not on file    Inability: Not on file  . Transportation needs:    Medical: Not on file    Non-medical: Not on file  Tobacco Use  . Smoking status: Current Every Day Smoker    Packs/day: 0.50    Years: 35.00    Pack years: 17.50    Types: Cigarettes  . Smokeless tobacco: Never Used  . Tobacco comment: 07/26/2012 has decreased smoking from 2ppd; offered cessation materials; pt declines  Substance and Sexual Activity  . Alcohol use: Yes    Alcohol/week: 1.0 standard drinks    Types: 1 Glasses of wine per week    Comment: rarely  . Drug use: No  . Sexual activity: Never    Birth control/protection: Surgical  Lifestyle  . Physical activity:    Days per week: Not on file    Minutes per session: Not on file  . Stress: Not on file  Relationships  . Social connections:    Talks on phone: Not on file    Gets together: Not on file    Attends religious service: Not on file    Active member of club or organization: Not on file    Attends meetings of clubs or organizations: Not on file    Relationship status: Not on file  Other Topics Concern  . Not on file  Social History Narrative   Currently unemployed.  Really wants to find a job, but has been difficult for  her.  No children, not married    Family History  Problem Relation Age of Onset  . Colon cancer Sister 65  . Breast cancer Mother 15  . Alzheimer's disease Mother   . Diabetes Sister 9  . Heart failure Sister   . Diabetes Sister 32  . Lung cancer Father   . Breast cancer Sister   . Cancer Sister        mouth  . Anesthesia problems Neg Hx   . Hypotension Neg Hx   . Malignant hyperthermia Neg Hx   .  Pseudochol deficiency Neg Hx     Health Maintenance  Topic Date Due  . PAP SMEAR-Modifier  04/26/1978  . OPHTHALMOLOGY EXAM  03/04/2014  . FOOT EXAM  06/20/2015  . MAMMOGRAM  06/25/2018  . HIV Screening  04/01/2019 (Originally 04/26/1972)  . HEMOGLOBIN A1C  05/20/2019  . COLONOSCOPY  10/09/2019  . TETANUS/TDAP  03/29/2025  . INFLUENZA VACCINE  Completed  . PNEUMOCOCCAL POLYSACCHARIDE VACCINE AGE 53-64 HIGH RISK  Completed  . Hepatitis C Screening  Completed    ----------------------------------------------------------------------------------------------------------------------------------------------------------------------------------------------------------------- Physical Exam There were no vitals taken for this visit.  Physical Exam Constitutional:      Appearance: Normal appearance.  HENT:     Right Ear: Tympanic membrane normal.     Left Ear: Tympanic membrane normal.     Mouth/Throat:     Mouth: Mucous membranes are moist.  Eyes:     General: No scleral icterus. Cardiovascular:     Rate and Rhythm: Normal rate and regular rhythm.  Pulmonary:     Effort: Pulmonary effort is normal.     Breath sounds: Wheezing present.     Comments: Bilateral expiratory wheezes.  Abdominal:     General: Abdomen is flat. There is no distension.     Palpations: Abdomen is soft.     Tenderness: There is abdominal tenderness (mild epigastric tenderness).  Musculoskeletal:     Right lower leg: No edema.     Left lower leg: No edema.  Skin:    General: Skin is warm and dry.    Neurological:     General: No focal deficit present.     Mental Status: She is alert.  Psychiatric:        Mood and Affect: Mood normal.        Behavior: Behavior normal.     ------------------------------------------------------------------------------------------------------------------------------------------------------------------------------------------------------------------- Assessment and Plan  COPD exacerbation ([place]) -Will try xopenex prn given previous intolerance to albuterol and episode of SVT.  -Start prednisone burst and doxycycline.  Instructed to monitor blood sugars closely.  -CXR ordered.  -Recommend that she follow up with pulmonologist as well.

## 2019-01-11 NOTE — Progress Notes (Signed)
CXR with some vascular congestion, she should keep taking lasix and complete antibiotic.

## 2019-01-15 ENCOUNTER — Other Ambulatory Visit: Payer: Self-pay | Admitting: Podiatry

## 2019-01-20 ENCOUNTER — Encounter: Payer: Self-pay | Admitting: Podiatry

## 2019-02-04 ENCOUNTER — Encounter: Payer: Self-pay | Admitting: *Deleted

## 2019-02-04 ENCOUNTER — Ambulatory Visit: Payer: Self-pay | Admitting: *Deleted

## 2019-02-04 NOTE — Telephone Encounter (Signed)
Summary: Continuous Cold symptoms   Pt stated she has been sick for about a week. She has a fever of 102.4 and a terrible cough. She is unable to come in to be seen and would like to know if Dr. Zigmund Daniel can send in something to her pharmacy. Please advise.

## 2019-02-04 NOTE — Telephone Encounter (Signed)
No response from patient- 3rd attempt to call patient- left message to call back.

## 2019-02-04 NOTE — Telephone Encounter (Addendum)
Sent patient a message via [facility]. Dr. Zigmund Daniel recommends an Evisit or make an appointment to be evaluated.

## 2019-02-07 ENCOUNTER — Other Ambulatory Visit: Payer: Self-pay | Admitting: Family Medicine

## 2019-02-07 ENCOUNTER — Ambulatory Visit (INDEPENDENT_AMBULATORY_CARE_PROVIDER_SITE_OTHER): Payer: Medicare Other | Admitting: Family Medicine

## 2019-02-07 ENCOUNTER — Encounter: Payer: Self-pay | Admitting: Family Medicine

## 2019-02-07 ENCOUNTER — Other Ambulatory Visit: Payer: Self-pay

## 2019-02-07 VITALS — BP 126/72 | HR 94 | Temp 98.1°F | Resp 16 | Ht 63.5 in | Wt 225.8 lb

## 2019-02-07 DIAGNOSIS — N3090 Cystitis, unspecified without hematuria: Secondary | ICD-10-CM

## 2019-02-07 DIAGNOSIS — R3989 Other symptoms and signs involving the genitourinary system: Secondary | ICD-10-CM | POA: Diagnosis not present

## 2019-02-07 DIAGNOSIS — J189 Pneumonia, unspecified organism: Secondary | ICD-10-CM | POA: Diagnosis not present

## 2019-02-07 LAB — POCT URINALYSIS DIPSTICK
Glucose, UA: NEGATIVE
Nitrite, UA: NEGATIVE
Protein, UA: POSITIVE — AB
Spec Grav, UA: 1.02 (ref 1.010–1.025)
Urobilinogen, UA: 0.2 E.U./dL
pH, UA: 6 (ref 5.0–8.0)

## 2019-02-07 MED ORDER — LEVOFLOXACIN 500 MG PO TABS: 500.0000 mg | ORAL_TABLET | Freq: Every day | ORAL | 0 refills | Status: DC

## 2019-02-07 MED ORDER — LEVALBUTEROL TARTRATE 45 MCG/ACT IN AERO: 1.0000 | INHALATION_SPRAY | Freq: Four times a day (QID) | RESPIRATORY_TRACT | 3 refills | Status: DC | PRN

## 2019-02-07 NOTE — Progress Notes (Signed)
[name] - 63 y.o. female MRN [id number]  Date of birth: 1956/04/30  Subjective Chief Complaint  Patient presents with  . Cough    alittle mucus comes up, on going for two weeks  . Emesis  . Fever    on wednesday pt had a fever of 10.3.7  . cloudy urine    denies dysuria    HPI [name] is a 63 y.o. female here today with complaint of cough and discolored/foul smelling urine.  She reports she has had cough for about 2 weeks and last week developed worsening congestion with fever of 102-103, nausea and vomiting, body aches and headache.  She was unable to be reached to come in for appt at that time. She has been drinking more juices over the weekend to keep herself hydrated but still hasn't had much appetite.  She does endorse some mild shortness of breath and wheezing.  She never got xopenex that was sent to pharmacy previously.  At this point her fever has resolved.    In regards to her urine she denies pain with urination, increased frequency or flank pain.   ROS:  A comprehensive ROS was completed and negative except as noted per HPI  Allergies  Allergen Reactions  . Atorvastatin Other (See Comments) and Nausea And Vomiting    "legs cramped; moderate to almost severe" "legs cramped; moderate to almost severe"  . Ibuprofen Other (See Comments)    Other reaction(s): Other (See Comments) Per pt, MD doesn't want pt to take Per pt, MD doesn't want pt to take  . Metformin And Related Nausea And Vomiting  . Ace Inhibitors Cough and Nausea And Vomiting  . Hydrocodone-Acetaminophen Nausea And Vomiting  . Albuterol Other (See Comments)    Caused SVT    Past Medical History:  Diagnosis Date  . Anxiety    doesn't take any meds for this  . Arteriosclerosis of coronary artery 08/31/2011   Myoview 9/12: EF 44, no ischemia // Myoview 8/18: EF 57, low risk, possible small area of inferior ischemia // Minor nonobstructive CAD by cardiac catheterization - LHC 8/18:  Proximal LAD 35, OM2 25  . Arthritis    knuckles  . Asthma   . Carotid artery disease ([place])    Carotid US 7/14: Bilateral ICA 40-59 // Carotid US ([facility]) 2/17: bilat plaque without sig stenosis  . Chronic diastolic CHF (congestive heart failure) ([place]) 07/26/2017   Echo 7/18:Severe conc LVH, EF 60-65, no RWMA, Gr 2 DD, MAC, mild MR // right and left heart cath 8/18: LVEDP 21  . Claustrophobia 07/26/2012  . COPD (chronic obstructive pulmonary disease) ([place])    "chronic bronchitis - about every winter"  . CSF leak    dizziness/headache - assoc symptoms // 2/2 encephalocele s/p skull base reconstruction  . Diverticulosis   . History of CVA (cerebrovascular accident) 03/2004   denies residual 07/26/2012  . Hx of gout   . Hyperlipidemia    takes Pravastatin daily  . Hypertensive heart disease with chronic diastolic congestive heart failure ([place]) 07/26/2017  . Internal hemorrhoids   . Kidney stones   . LVH (left ventricular hypertrophy)    Echo 8/18: Severe conc LVH, EF 60-65, no RWMA, Gr 2 DD, MAC, mild MR  . OSA (obstructive sleep apnea) 07/26/2012   prior CPAP - stopped due to being primary caregiver for dying parent (stopped in 2016)  . PSVT (paroxysmal supraventricular tachycardia) ([place])   . Pulmonary hypertension, unspecified ([place]) 08/14/2017  Right heart cath 8/13:RVSP 66, PASP 57, mean PA 39, mean PWCP 18, LVEDP 21  . Type II diabetes mellitus ([place])     Past Surgical History:  Procedure Laterality Date  . ESOPHAGOGASTRODUODENOSCOPY (EGD) WITH PROPOFOL N/A 07/13/2015   Procedure: ESOPHAGOGASTRODUODENOSCOPY (EGD) WITH PROPOFOL;  Surgeon: Carol Ada, MD;  [facility];  Service: Endoscopy;  Laterality: N/A;  . NASAL SINUS SURGERY  01/29/2012   Procedure: ENDOSCOPIC SINUS SURGERY WITH STEALTH;  Surgeon: Ruby Cola, MD;  Location: [facility];  Service: ENT;  Laterality: N/A;  . NM MYOVIEW LTD  08/25/2011   Low risk study, no evidence of ischemia, EF 44%  . REFRACTIVE SURGERY  ~  2010   left  . RIGHT/LEFT HEART CATH AND CORONARY ANGIOGRAPHY N/A 07/30/2017   Procedure: RIGHT/LEFT HEART CATH AND CORONARY ANGIOGRAPHY;  Surgeon: Martinique, Peter M, MD;  Location: [facility];  Service: Cardiovascular;  Laterality: N/A;  . SPHENOIDECTOMY  01/29/2012   Procedure: SPHENOIDECTOMY;  Surgeon: Ruby Cola, MD;  Location: [place] [facility];  Service: ENT;  Laterality: Left;  REPAIR OF LEFT SPHENOID    . TOTAL ABDOMINAL HYSTERECTOMY  1994    Social History   Socioeconomic History  . Marital status: Single    Spouse name: Not on file  . Number of children: 0  . Years of education: Not on file  . Highest education level: Not on file  Occupational History  . Occupation: disabled  Social Needs  . Financial resource strain: Not on file  . Food insecurity:    Worry: Not on file    Inability: Not on file  . Transportation needs:    Medical: Not on file    Non-medical: Not on file  Tobacco Use  . Smoking status: Current Every Day Smoker    Packs/day: 0.50    Years: 35.00    Pack years: 17.50    Types: Cigarettes  . Smokeless tobacco: Never Used  . Tobacco comment: 07/26/2012 has decreased smoking from 2ppd; offered cessation materials; pt declines  Substance and Sexual Activity  . Alcohol use: Yes    Alcohol/week: 1.0 standard drinks    Types: 1 Glasses of wine per week    Comment: rarely  . Drug use: No  . Sexual activity: Never    Birth control/protection: Surgical  Lifestyle  . Physical activity:    Days per week: Not on file    Minutes per session: Not on file  . Stress: Not on file  Relationships  . Social connections:    Talks on phone: Not on file    Gets together: Not on file    Attends religious service: Not on file    Active member of club or organization: Not on file    Attends meetings of clubs or organizations: Not on file    Relationship status: Not on file  Other Topics Concern  . Not on file  Social History Narrative   Currently unemployed.   Really wants to find a job, but has been difficult for her.  No children, not married    Family History  Problem Relation Age of Onset  . Colon cancer Sister 40  . Breast cancer Mother 21  . Alzheimer's disease Mother   . Diabetes Sister 64  . Heart failure Sister   . Diabetes Sister 23  . Lung cancer Father   . Breast cancer Sister   . Cancer Sister        mouth  . Anesthesia problems Neg  Hx   . Hypotension Neg Hx   . Malignant hyperthermia Neg Hx   . Pseudochol deficiency Neg Hx     Health Maintenance  Topic Date Due  . PAP SMEAR-Modifier  04/26/1978  . OPHTHALMOLOGY EXAM  03/04/2014  . FOOT EXAM  06/20/2015  . MAMMOGRAM  06/25/2018  . HIV Screening  04/01/2019 (Originally 04/26/1972)  . HEMOGLOBIN A1C  05/20/2019  . COLONOSCOPY  10/09/2019  . TETANUS/TDAP  03/29/2025  . INFLUENZA VACCINE  Completed  . PNEUMOCOCCAL POLYSACCHARIDE VACCINE AGE 77-64 HIGH RISK  Completed  . Hepatitis C Screening  Completed    ----------------------------------------------------------------------------------------------------------------------------------------------------------------------------------------------------------------- Physical Exam BP 126/72 (BP Location: Left Arm)   Pulse 94   Temp 98.1 F (36.7 C) (Oral)   Resp 16   Ht 5' 3.5" (1.613 m)   Wt 225 lb 12.8 oz (102.4 kg)   SpO2 94%   BMI 39.37 kg/m   Physical Exam Constitutional:      Appearance: Normal appearance.  HENT:     Head: Normocephalic and atraumatic.     Right Ear: Tympanic membrane normal.     Left Ear: Tympanic membrane normal.     Nose: No congestion.     Mouth/Throat:     Mouth: Mucous membranes are dry.  Eyes:     General: No scleral icterus. Neck:     Musculoskeletal: Neck supple.  Cardiovascular:     Rate and Rhythm: Normal rate and regular rhythm.     Heart sounds: Normal heart sounds.  Pulmonary:     Effort: Pulmonary effort is normal.     Comments: Slightly diminished breath sounds on  R.  Abdominal:     Tenderness: There is no right CVA tenderness or left CVA tenderness.  Skin:    General: Skin is warm and dry.     Findings: No rash.  Neurological:     General: No focal deficit present.     Mental Status: She is alert.  Psychiatric:        Mood and Affect: Mood normal.        Behavior: Behavior normal.     ------------------------------------------------------------------------------------------------------------------------------------------------------------------------------------------------------------------- Assessment and Plan  Pneumonia due to infectious organism -Symptoms last week suspicious for influenza with exam today consistent with pneumonia. -Rx for levaquin 577m x 7 days which should cover for UTI as well.  Discussed side effects of levaquin and to stop if having muscle or tendon pain.  -Rx for xopenex sent in again.  -Discussed increasing intake of non sweetened fluids/beverages.     Cystitis -Rx for levaquin -Urine sent for culture and will tailor antibiotics if needed to results.

## 2019-02-07 NOTE — Assessment & Plan Note (Signed)
-  Rx for levaquin -Urine sent for culture and will tailor antibiotics if needed to results.

## 2019-02-07 NOTE — Assessment & Plan Note (Addendum)
-  Symptoms last week suspicious for influenza with exam today consistent with pneumonia. -Rx for levaquin 500mg  x 7 days which should cover for UTI as well.  Discussed side effects of levaquin and to stop if having muscle or tendon pain.  -Rx for xopenex sent in again.  -Discussed increasing intake of non sweetened fluids/beverages.

## 2019-02-07 NOTE — Patient Instructions (Signed)
Start levaquin daily Use xopenex every 4-6 hours as needed for wheezing or shortness of breath.  Increase fluid intake but limit juices and other sweetened beverage.  We'll give you a call with urine culture results Let me know if your symptoms worsen.

## 2019-02-08 ENCOUNTER — Other Ambulatory Visit: Payer: Self-pay | Admitting: Family Medicine

## 2019-02-08 LAB — URINE CULTURE
MICRO NUMBER:: 263399
SPECIMEN QUALITY:: ADEQUATE

## 2019-02-13 IMAGING — US US ABDOMEN COMPLETE
1 series · 14 of 25 positions shown · non-contrast
Comparison: None.

CLINICAL DATA: Abdominal cramping, epigastric pain for 1 year

EXAM:
ABDOMEN ULTRASOUND COMPLETE

[Series 1: us abdomen complete · 0.19mm/px · 14 of 184 slices shown]
[im 1/184]
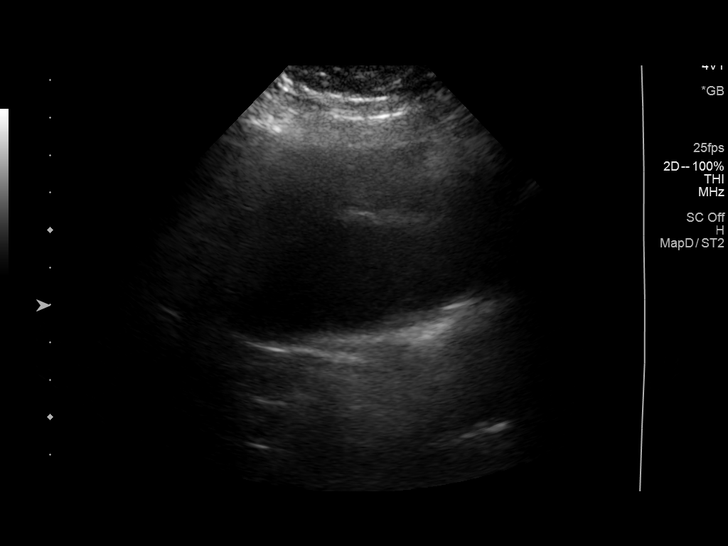
[im 16/184]
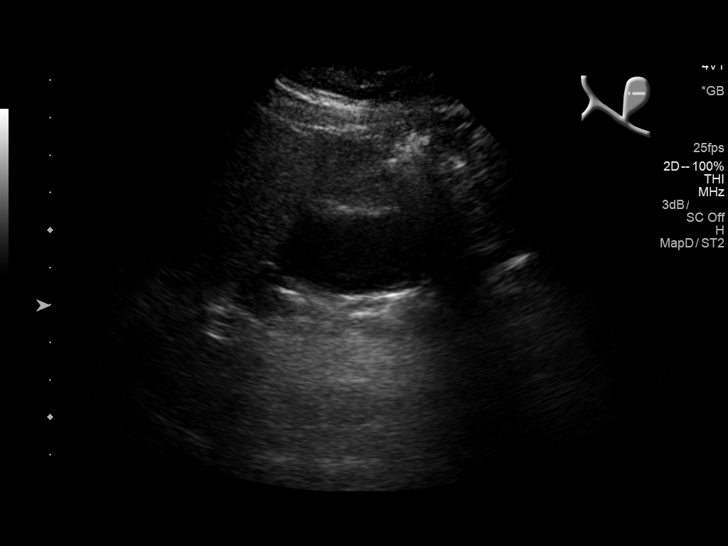
[im 31/184]
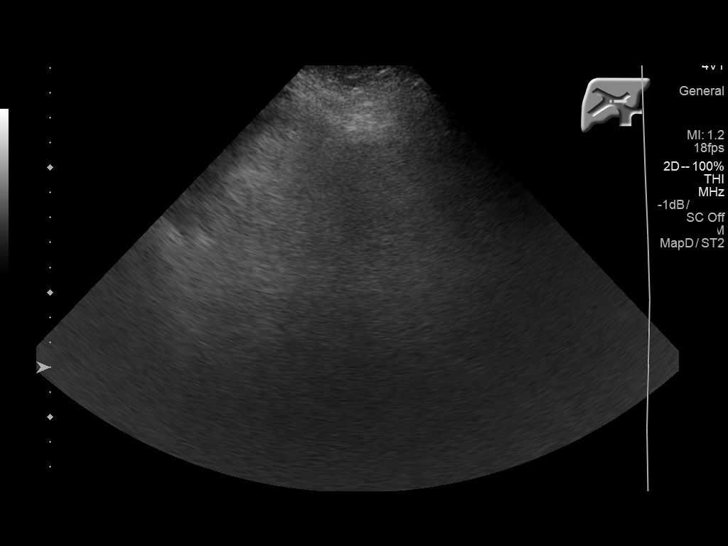
[im 46/184]
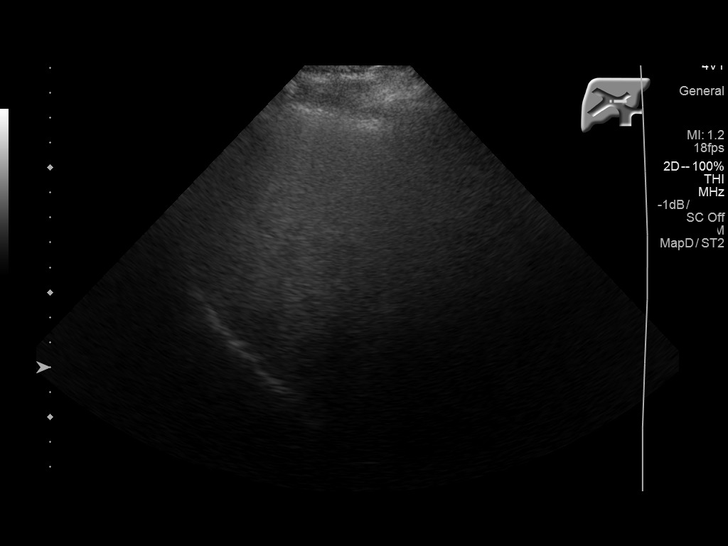
[im 62/184]
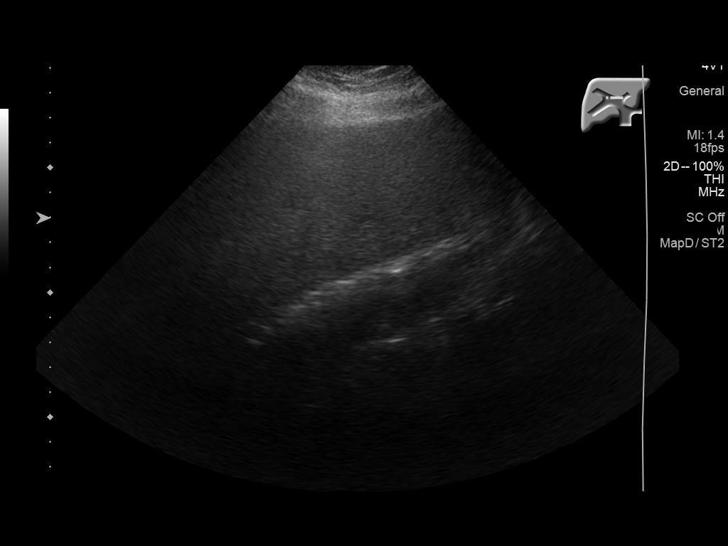
[im 69/184]
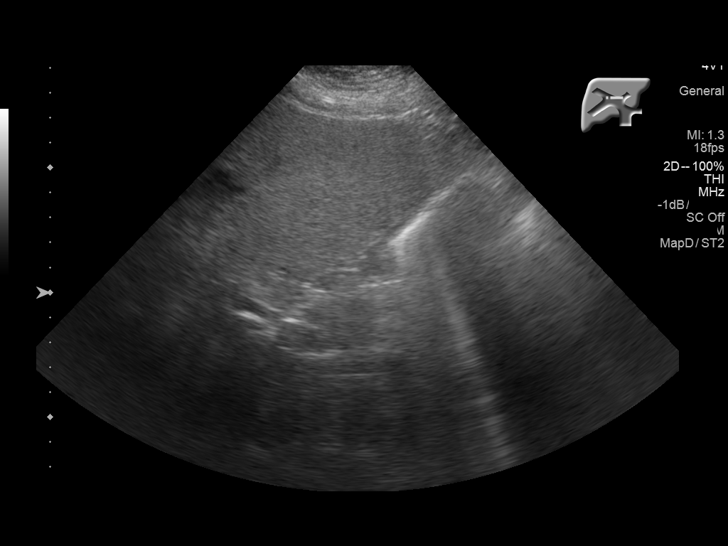
[im 84/184]
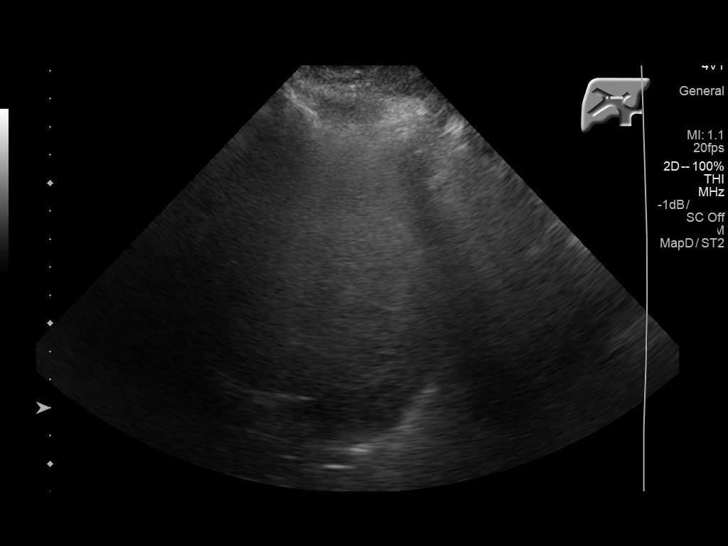
[im 100/184]
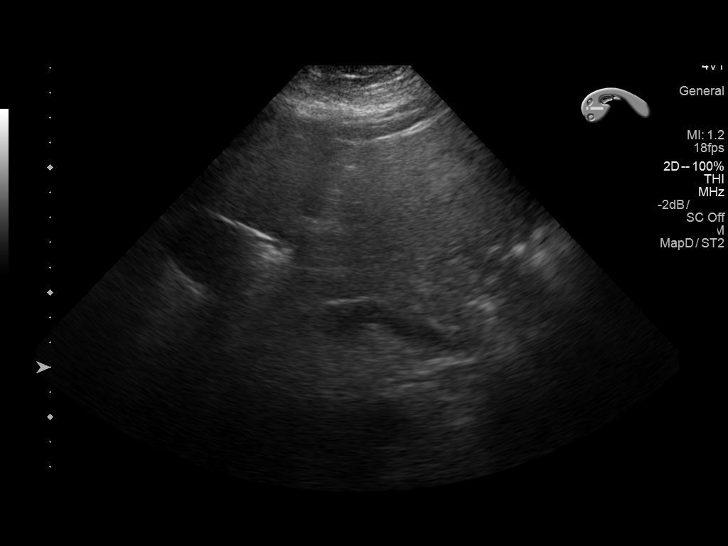
[im 115/184]
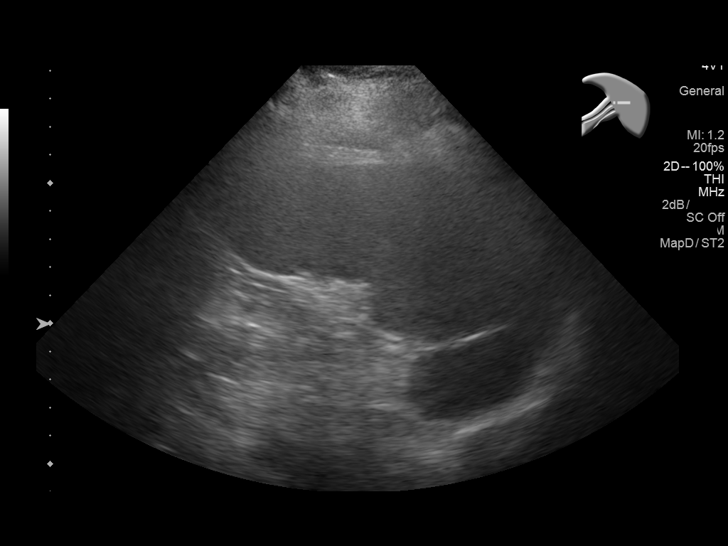
[im 123/184]
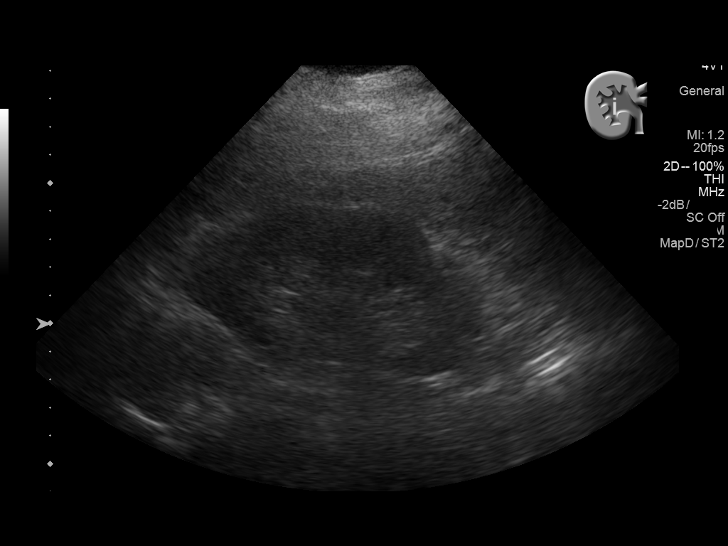
[im 138/184]
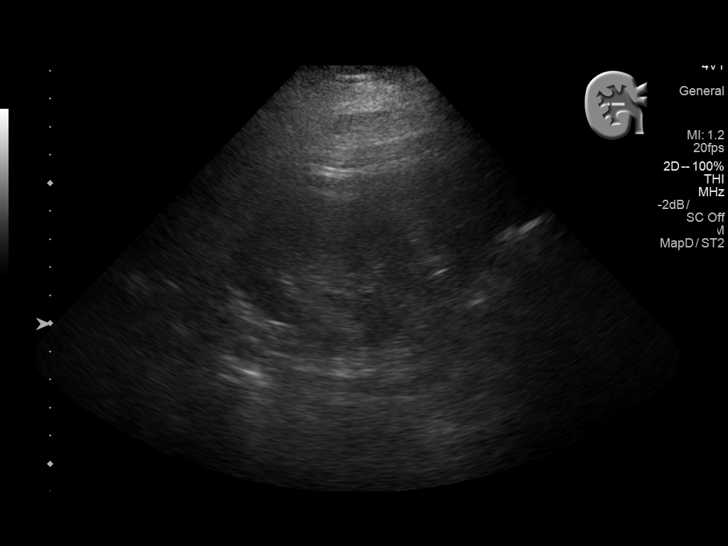
[im 153/184]
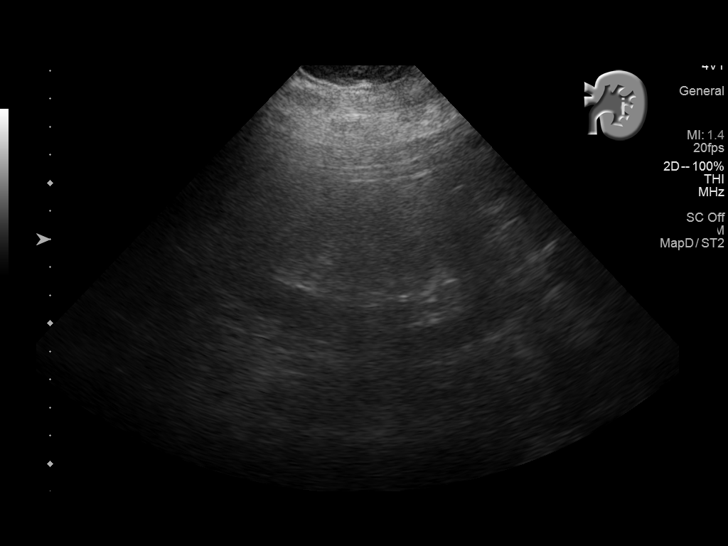
[im 168/184]
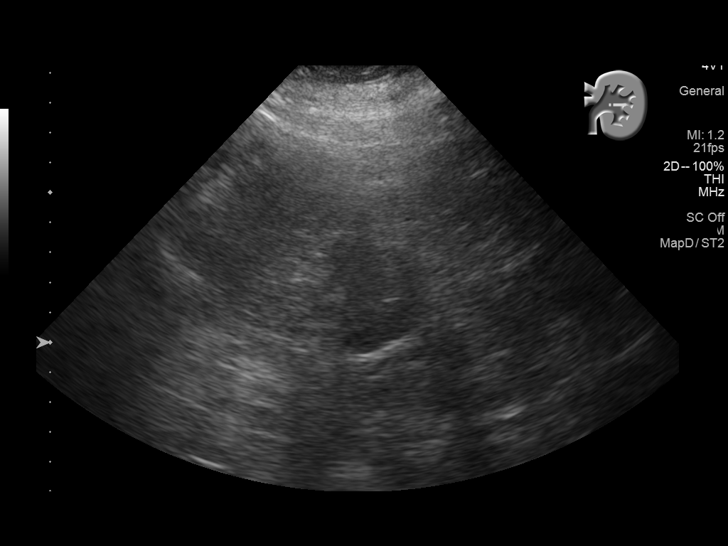
[im 184/184]
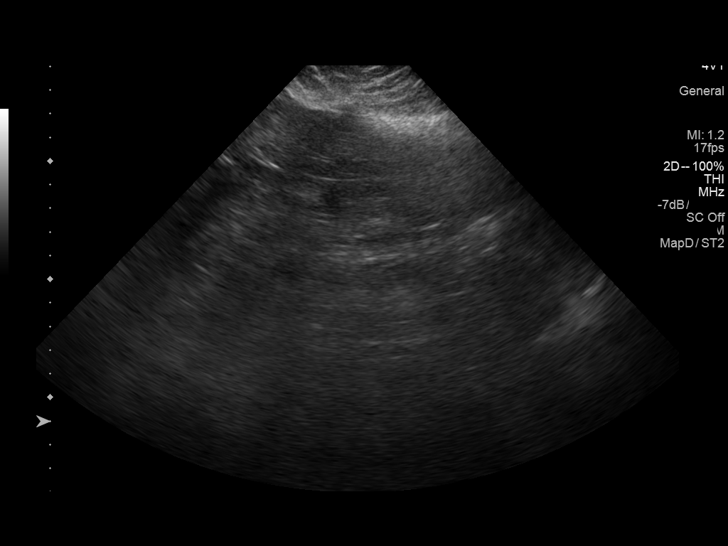

[14 of 25 positions shown; findings below may reference images not displayed]

FINDINGS: Gallbladder: No gallstones or wall thickening visualized. No
sonographic Murphy sign noted by sonographer.

Common bile duct: Diameter: Normal diameter, 5-6 mm

Liver: Increased echotexture compatible with fatty infiltration. No
focal abnormality or biliary ductal dilatation.

IVC: No abnormality visualized.

Pancreas: Visualized portion unremarkable.

Spleen: Size and appearance within normal limits.

Right Kidney: Length: 11.1 cm. Echogenicity within normal limits. No
mass or hydronephrosis visualized.

Left Kidney: Length: 11.2 cm. Echogenicity within normal limits. No
mass or hydronephrosis visualized.

Abdominal aorta: No aneurysm visualized.

Other findings: None.
IMPRESSION: Diffusely increased echotexture throughout the liver compatible with
fatty infiltration.

No acute findings.

## 2019-02-17 ENCOUNTER — Encounter: Payer: Self-pay | Admitting: Family Medicine

## 2019-02-17 ENCOUNTER — Other Ambulatory Visit: Payer: Self-pay

## 2019-02-17 ENCOUNTER — Ambulatory Visit (INDEPENDENT_AMBULATORY_CARE_PROVIDER_SITE_OTHER): Payer: Medicare Other | Admitting: Family Medicine

## 2019-02-17 VITALS — BP 142/62 | HR 92 | Temp 98.3°F | Ht 62.5 in | Wt 224.2 lb

## 2019-02-17 DIAGNOSIS — Z794 Long term (current) use of insulin: Secondary | ICD-10-CM | POA: Diagnosis not present

## 2019-02-17 DIAGNOSIS — N183 Chronic kidney disease, stage 3 unspecified: Secondary | ICD-10-CM

## 2019-02-17 DIAGNOSIS — E1122 Type 2 diabetes mellitus with diabetic chronic kidney disease: Secondary | ICD-10-CM | POA: Diagnosis not present

## 2019-02-17 DIAGNOSIS — J189 Pneumonia, unspecified organism: Secondary | ICD-10-CM | POA: Diagnosis not present

## 2019-02-17 DIAGNOSIS — I10 Essential (primary) hypertension: Secondary | ICD-10-CM

## 2019-02-17 DIAGNOSIS — F17210 Nicotine dependence, cigarettes, uncomplicated: Secondary | ICD-10-CM | POA: Diagnosis not present

## 2019-02-17 DIAGNOSIS — I2510 Atherosclerotic heart disease of native coronary artery without angina pectoris: Secondary | ICD-10-CM

## 2019-02-17 LAB — HEMOGLOBIN A1C: Hgb A1c MFr Bld: 8 % — ABNORMAL HIGH (ref 4.6–6.5)

## 2019-02-17 MED ORDER — PREGABALIN 150 MG PO CAPS: 150.0000 mg | ORAL_CAPSULE | Freq: Two times a day (BID) | ORAL | 5 refills | Status: DC

## 2019-02-17 MED ORDER — ISOSORBIDE MONONITRATE ER 60 MG PO TB24: 60.0000 mg | ORAL_TABLET | Freq: Every day | ORAL | 0 refills | Status: DC

## 2019-02-17 NOTE — Progress Notes (Signed)
[name] - 63 y.o. female MRN [id number]  Date of birth: 11-24-1956  Subjective Chief Complaint  Patient presents with  . Follow-up    63Mo F/U DM, Last A1-C 7.9 on 11/18/18, Am GSB 153 today.     HPI [name] is a 63 y.o. female here today for follow up of HTN, T2DM and recent respiratory illness.  -HTN/CAD:  Reports compliance with current medications.  Needs refill of imdur however is due for f/u with cardiology.  She denies side effects from medications.  She has not had recent episodes of SVT and denies anginal symptoms.  She does unfortunately continue to smoke about 1ppd.   -T2DM:  Reports blood sugars have been fairly well controlled at home.  Readings after eating from 150-200.  Denies symptoms of hypoglycemia.  She has cut out sweet tea recently.  She does not exercise.   -Pneumonia:  Reports that she feels much better since completing levaquin.  She denies side effects  Allergies  Allergen Reactions  . Atorvastatin Other (See Comments) and Nausea And Vomiting    "legs cramped; moderate to almost severe" "legs cramped; moderate to almost severe"  . Ibuprofen Other (See Comments)    Other reaction(s): Other (See Comments) Per pt, MD doesn't want pt to take Per pt, MD doesn't want pt to take  . Metformin And Related Nausea And Vomiting  . Ace Inhibitors Cough and Nausea And Vomiting  . Hydrocodone-Acetaminophen Nausea And Vomiting  . Albuterol Other (See Comments)    Caused SVT    Past Medical History:  Diagnosis Date  . Anxiety    doesn't take any meds for this  . Arteriosclerosis of coronary artery 08/31/2011   Myoview 9/12: EF 44, no ischemia // Myoview 8/18: EF 57, low risk, possible small area of inferior ischemia // Minor nonobstructive CAD by cardiac catheterization - LHC 8/18: Proximal LAD 35, OM2 25  . Arthritis    knuckles  . Asthma   . Carotid artery disease ([place])    Carotid US 7/14: Bilateral ICA 40-59 // Carotid US ([facility]) 2/17: bilat  plaque without sig stenosis  . Chronic diastolic CHF (congestive heart failure) ([place]) 07/26/2017   Echo 7/18:Severe conc LVH, EF 60-65, no RWMA, Gr 2 DD, MAC, mild MR // right and left heart cath 8/18: LVEDP 21  . Claustrophobia 07/26/2012  . COPD (chronic obstructive pulmonary disease) ([place])    "chronic bronchitis - about every winter"  . CSF leak    dizziness/headache - assoc symptoms // 2/2 encephalocele s/p skull base reconstruction  . Diverticulosis   . History of CVA (cerebrovascular accident) 03/2004   denies residual 07/26/2012  . Hx of gout   . Hyperlipidemia    takes Pravastatin daily  . Hypertensive heart disease with chronic diastolic congestive heart failure ([place]) 07/26/2017  . Internal hemorrhoids   . Kidney stones   . LVH (left ventricular hypertrophy)    Echo 8/18: Severe conc LVH, EF 60-65, no RWMA, Gr 2 DD, MAC, mild MR  . OSA (obstructive sleep apnea) 07/26/2012   prior CPAP - stopped due to being primary caregiver for dying parent (stopped in 2016)  . PSVT (paroxysmal supraventricular tachycardia) ([place])   . Pulmonary hypertension, unspecified ([place]) 08/14/2017   Right heart cath 8/13:RVSP 66, PASP 57, mean PA 39, mean PWCP 18, LVEDP 21  . Type II diabetes mellitus ([place])     Past Surgical History:  Procedure Laterality Date  . ESOPHAGOGASTRODUODENOSCOPY (EGD) WITH PROPOFOL N/A 07/13/2015  Procedure: ESOPHAGOGASTRODUODENOSCOPY (EGD) WITH PROPOFOL;  Surgeon: Carol Ada, MD;  Location: [facility];  Service: Endoscopy;  Laterality: N/A;  . NASAL SINUS SURGERY  01/29/2012   Procedure: ENDOSCOPIC SINUS SURGERY WITH STEALTH;  Surgeon: Ruby Cola, MD;  Location: [facility];  Service: ENT;  Laterality: N/A;  . NM MYOVIEW LTD  08/25/2011   Low risk study, no evidence of ischemia, EF 44%  . REFRACTIVE SURGERY  ~ 2010   left  . RIGHT/LEFT HEART CATH AND CORONARY ANGIOGRAPHY N/A 07/30/2017   Procedure: RIGHT/LEFT HEART CATH AND CORONARY ANGIOGRAPHY;  Surgeon: Martinique, Peter M, MD;   Location: [facility];  Service: Cardiovascular;  Laterality: N/A;  . SPHENOIDECTOMY  01/29/2012   Procedure: SPHENOIDECTOMY;  Surgeon: Ruby Cola, MD;  Location: [facility];  Service: ENT;  Laterality: Left;  REPAIR OF LEFT SPHENOID    . TOTAL ABDOMINAL HYSTERECTOMY  1994    Social History   Socioeconomic History  . Marital status: Single    Spouse name: Not on file  . Number of children: 0  . Years of education: Not on file  . Highest education level: Not on file  Occupational History  . Occupation: disabled  Social Needs  . Financial resource strain: Not on file  . Food insecurity:    Worry: Not on file    Inability: Not on file  . Transportation needs:    Medical: Not on file    Non-medical: Not on file  Tobacco Use  . Smoking status: Current Every Day Smoker    Packs/day: 0.50    Years: 35.00    Pack years: 17.50    Types: Cigarettes  . Smokeless tobacco: Never Used  . Tobacco comment: 07/26/2012 has decreased smoking from 2ppd; offered cessation materials; pt declines  Substance and Sexual Activity  . Alcohol use: Yes    Alcohol/week: 1.0 standard drinks    Types: 1 Glasses of wine per week    Comment: rarely  . Drug use: No  . Sexual activity: Never    Birth control/protection: Surgical  Lifestyle  . Physical activity:    Days per week: Not on file    Minutes per session: Not on file  . Stress: Not on file  Relationships  . Social connections:    Talks on phone: Not on file    Gets together: Not on file    Attends religious service: Not on file    Active member of club or organization: Not on file    Attends meetings of clubs or organizations: Not on file    Relationship status: Not on file  Other Topics Concern  . Not on file  Social History Narrative   Currently unemployed.  Really wants to find a job, but has been difficult for her.  No children, not married    Family History  Problem Relation Age of Onset  . Colon cancer Sister 74  . Breast  cancer Mother 78  . Alzheimer's disease Mother   . Diabetes Sister 48  . Heart failure Sister   . Diabetes Sister 65  . Lung cancer Father   . Breast cancer Sister   . Cancer Sister        mouth  . Anesthesia problems Neg Hx   . Hypotension Neg Hx   . Malignant hyperthermia Neg Hx   . Pseudochol deficiency Neg Hx     Health Maintenance  Topic Date Due  . PAP SMEAR-Modifier  04/26/1978  . OPHTHALMOLOGY EXAM  03/04/2014  .  FOOT EXAM  06/20/2015  . MAMMOGRAM  06/25/2018  . HIV Screening  04/01/2019 (Originally 04/26/1972)  . HEMOGLOBIN A1C  05/20/2019  . COLONOSCOPY  10/09/2019  . TETANUS/TDAP  03/29/2025  . INFLUENZA VACCINE  Completed  . PNEUMOCOCCAL POLYSACCHARIDE VACCINE AGE 34-64 HIGH RISK  Completed  . Hepatitis C Screening  Completed    ----------------------------------------------------------------------------------------------------------------------------------------------------------------------------------------------------------------- Physical Exam BP (!) 142/62   Pulse 92   Temp 98.3 F (36.8 C) (Oral)   Ht 5' 2.5" (1.588 m)   Wt 224 lb 3.2 oz (101.7 kg)   SpO2 96%   BMI 40.35 kg/m   Physical Exam Constitutional:      Appearance: She is obese.  HENT:     Head: Normocephalic and atraumatic.     Mouth/Throat:     Mouth: Mucous membranes are moist.  Eyes:     General: No scleral icterus. Neck:     Musculoskeletal: Neck supple.  Cardiovascular:     Rate and Rhythm: Normal rate and regular rhythm.  Pulmonary:     Effort: Pulmonary effort is normal.     Breath sounds: Normal breath sounds.  Musculoskeletal: Normal range of motion.     Right lower leg: No edema.     Left lower leg: No edema.  Skin:    General: Skin is warm and dry.  Neurological:     General: No focal deficit present.     Mental Status: She is alert.  Psychiatric:        Mood and Affect: Mood normal.        Behavior: Behavior normal.      ------------------------------------------------------------------------------------------------------------------------------------------------------------------------------------------------------------------- Assessment and Plan  Pneumonia Still with mild cough which is chronic, acute symptoms have resolved.   Essential hypertension BP remains fairly well controlled, some issues with compliance.  -Stressed importance of low salt diet and starting to walk some -Discussed that she needs to quit smoking.   Arteriosclerosis of coronary artery Denies anginal symptoms -No recent episodes of SVT -Imdur refilled x30 days and recommend that she f/u with cardiology.   Type 2 diabetes mellitus with stage 3 chronic kidney disease, with long-term current use of insulin (HCC) -Blood sugar readings stable at home -Update a1c -Continue to work on dietary changes.   Nicotine dependence, cigarettes, uncomplicated -counseled on smoking cessation -Discussed medications however she declines at this time -She will work on cutting back -Reassess at f/u in 3 months.   5 minutes spent counseling on smoking cessation.

## 2019-02-17 NOTE — Patient Instructions (Signed)
Schedule a follow up with Cardiology  Diabetes Mellitus and Nutrition, Adult When you have diabetes (diabetes mellitus), it is very important to have healthy eating habits because your blood sugar (glucose) levels are greatly affected by what you eat and drink. Eating healthy foods in the appropriate amounts, at about the same times every day, can help you:  Control your blood glucose.  Lower your risk of heart disease.  Improve your blood pressure.  Reach or maintain a healthy weight. Every person with diabetes is different, and each person has different needs for a meal plan. Your health care provider may recommend that you work with a diet and nutrition specialist (dietitian) to make a meal plan that is best for you. Your meal plan may vary depending on factors such as:  The calories you need.  The medicines you take.  Your weight.  Your blood glucose, blood pressure, and cholesterol levels.  Your activity level.  Other health conditions you have, such as heart or kidney disease. How do carbohydrates affect me? Carbohydrates, also called carbs, affect your blood glucose level more than any other type of food. Eating carbs naturally raises the amount of glucose in your blood. Carb counting is a method for keeping track of how many carbs you eat. Counting carbs is important to keep your blood glucose at a healthy level, especially if you use insulin or take certain oral diabetes medicines. It is important to know how many carbs you can safely have in each meal. This is different for every person. Your dietitian can help you calculate how many carbs you should have at each meal and for each snack. Foods that contain carbs include:  Bread, cereal, rice, pasta, and crackers.  Potatoes and corn.  Peas, beans, and lentils.  Milk and yogurt.  Fruit and juice.  Desserts, such as cakes, cookies, ice cream, and candy. How does alcohol affect me? Alcohol can cause a sudden decrease  in blood glucose (hypoglycemia), especially if you use insulin or take certain oral diabetes medicines. Hypoglycemia can be a life-threatening condition. Symptoms of hypoglycemia (sleepiness, dizziness, and confusion) are similar to symptoms of having too much alcohol. If your health care provider says that alcohol is safe for you, follow these guidelines:  Limit alcohol intake to no more than 1 drink per day for nonpregnant women and 2 drinks per day for men. One drink equals 12 oz of beer, 5 oz of wine, or 1 oz of hard liquor.  Do not drink on an empty stomach.  Keep yourself hydrated with water, diet soda, or unsweetened iced tea.  Keep in mind that regular soda, juice, and other mixers may contain a lot of sugar and must be counted as carbs. What are tips for following this plan?  Reading food labels  Start by checking the serving size on the "Nutrition Facts" label of packaged foods and drinks. The amount of calories, carbs, fats, and other nutrients listed on the label is based on one serving of the item. Many items contain more than one serving per package.  Check the total grams (g) of carbs in one serving. You can calculate the number of servings of carbs in one serving by dividing the total carbs by 15. For example, if a food has 30 g of total carbs, it would be equal to 2 servings of carbs.  Check the number of grams (g) of saturated and trans fats in one serving. Choose foods that have low or no amount of  these fats.  Check the number of milligrams (mg) of salt (sodium) in one serving. Most people should limit total sodium intake to less than 2,300 mg per day.  Always check the nutrition information of foods labeled as "low-fat" or "nonfat". These foods may be higher in added sugar or refined carbs and should be avoided.  Talk to your dietitian to identify your daily goals for nutrients listed on the label. Shopping  Avoid buying canned, premade, or processed foods. These  foods tend to be high in fat, sodium, and added sugar.  Shop around the outside edge of the grocery store. This includes fresh fruits and vegetables, bulk grains, fresh meats, and fresh dairy. Cooking  Use low-heat cooking methods, such as baking, instead of high-heat cooking methods like deep frying.  Cook using healthy oils, such as olive, canola, or sunflower oil.  Avoid cooking with butter, cream, or high-fat meats. Meal planning  Eat meals and snacks regularly, preferably at the same times every day. Avoid going long periods of time without eating.  Eat foods high in fiber, such as fresh fruits, vegetables, beans, and whole grains. Talk to your dietitian about how many servings of carbs you can eat at each meal.  Eat 4-6 ounces (oz) of lean protein each day, such as lean meat, chicken, fish, eggs, or tofu. One oz of lean protein is equal to: ? 1 oz of meat, chicken, or fish. ? 1 egg. ?  cup of tofu.  Eat some foods each day that contain healthy fats, such as avocado, nuts, seeds, and fish. Lifestyle  Check your blood glucose regularly.  Exercise regularly as told by your health care provider. This may include: ? 150 minutes of moderate-intensity or vigorous-intensity exercise each week. This could be brisk walking, biking, or water aerobics. ? Stretching and doing strength exercises, such as yoga or weightlifting, at least 2 times a week.  Take medicines as told by your health care provider.  Do not use any products that contain nicotine or tobacco, such as cigarettes and e-cigarettes. If you need help quitting, ask your health care provider.  Work with a Social worker or diabetes educator to identify strategies to manage stress and any emotional and social challenges. Questions to ask a health care provider  Do I need to meet with a diabetes educator?  Do I need to meet with a dietitian?  What number can I call if I have questions?  When are the best times to check my  blood glucose? Where to find more information:  American Diabetes Association: diabetes.org  Academy of Nutrition and Dietetics: www.eatright.[employer] of Diabetes and Digestive and Kidney Diseases (NIH): [employer] Summary  A healthy meal plan will help you control your blood glucose and maintain a healthy lifestyle.  Working with a diet and nutrition specialist (dietitian) can help you make a meal plan that is best for you.  Keep in mind that carbohydrates (carbs) and alcohol have immediate effects on your blood glucose levels. It is important to count carbs and to use alcohol carefully. This information is not intended to replace advice given to you by your health care provider. Make sure you discuss any questions you have with your health care provider. Document Released: 08/21/2005 Document Revised: 06/24/2017 Document Reviewed: 12/29/2016 Elsevier Interactive Patient Education  2019 [employer].

## 2019-02-20 ENCOUNTER — Encounter: Payer: Self-pay | Admitting: Family Medicine

## 2019-02-20 NOTE — Assessment & Plan Note (Addendum)
-  counseled on smoking cessation -Discussed medications however she declines at this time -She will work on cutting back -Reassess at f/u in 3 months.   5 minutes spent counseling on smoking cessation.

## 2019-02-20 NOTE — Assessment & Plan Note (Signed)
-  Blood sugar readings stable at home -Update a1c -Continue to work on dietary changes.

## 2019-02-20 NOTE — Assessment & Plan Note (Signed)
BP remains fairly well controlled, some issues with compliance.  -Stressed importance of low salt diet and starting to walk some -Discussed that she needs to quit smoking.

## 2019-02-20 NOTE — Assessment & Plan Note (Signed)
Denies anginal symptoms -No recent episodes of SVT -Imdur refilled x30 days and recommend that she f/u with cardiology.

## 2019-02-20 NOTE — Assessment & Plan Note (Signed)
Still with mild cough which is chronic, acute symptoms have resolved.

## 2019-02-21 ENCOUNTER — Ambulatory Visit: Payer: Medicare Other | Admitting: Podiatry

## 2019-02-28 ENCOUNTER — Ambulatory Visit: Payer: Medicare Other | Admitting: Podiatry

## 2019-03-09 ENCOUNTER — Telehealth: Payer: Self-pay

## 2019-03-09 NOTE — Telephone Encounter (Signed)
Copied from [place] [id number]. Topic: Quick Communication - Lab Results (Clinic Use ONLY) >> Mar 09, 2019  8:45 AM Lennox Solders wrote: Pt is calling and would like her a1c results. Pt is not set up on mychart and she is in her car RCB to Pt. Gave HA1-C results. Encouaged Pt to continue the diet as discussed with Dr. Zigmund Daniel and to get MyChart set up on her phone. Call completed

## 2019-03-13 ENCOUNTER — Other Ambulatory Visit: Payer: Self-pay | Admitting: Family Medicine

## 2019-03-31 ENCOUNTER — Other Ambulatory Visit: Payer: Self-pay | Admitting: Family Medicine

## 2019-03-31 ENCOUNTER — Telehealth: Payer: Self-pay

## 2019-03-31 MED ORDER — GABAPENTIN 400 MG PO CAPS: 400.0000 mg | ORAL_CAPSULE | Freq: Three times a day (TID) | ORAL | 4 refills | Status: DC

## 2019-03-31 NOTE — Telephone Encounter (Signed)
Rx changed back to gabapentin.  Please have her let us know if symptoms persist.

## 2019-03-31 NOTE — Telephone Encounter (Signed)
Called Pt back. She is aware of Rx change.

## 2019-03-31 NOTE — Telephone Encounter (Signed)
Copied from [place] [id number]. Topic: General - Other >> Mar 31, 2019  9:12 AM Antonieta Iba C wrote: Reason for CRM: pt says she just started taking Lyrica as prescribed.  Pt says that she has been feeling off balance and forgetful. Pt feels that it is definitely the medication. Pt says that she read that those are side effects of medication. Pt says that she would like to go back to taking Gabapentin.

## 2019-04-20 ENCOUNTER — Ambulatory Visit: Payer: Self-pay

## 2019-04-20 ENCOUNTER — Telehealth: Payer: Self-pay | Admitting: Family Medicine

## 2019-04-20 NOTE — Telephone Encounter (Signed)
Incoming call from Patient, with complaint of  SOB intermittently since Thursday.  recently lost her Sister.  Rates it moderate to severe.Denies Cardiac hx .  Does have a history of Asthma.  Beccomes dizzy after Use  of inhaler.   Denies traveling  Outside of [place]. Reviewed protocol and provided Care  Advice.  Reports having a headache.  Reports Sat was, 81 at 3:00am   During call it went up to 97%.  Transferred to  [employer] office to make a virtual visit.    Reason for Disposition . [1] MILD difficulty breathing (e.g., minimal/no SOB at rest, SOB with walking, pulse <100) AND [2] NEW-onset or WORSE than normal  Answer Assessment - Initial Assessment Questions 1. RESPIRATORY STATUS: "Describe your breathing?" (e.g., wheezing, shortness of breath, unable to speak, severe coughing)      Sob 2. ONSET: "When did this breathing problem begin?"     Thursday or Friday 3. PATTERN "Does the difficult breathing come and go, or has it been constant since it started?"      Yes comes and goes 4. SEVERITY: "How bad is your breathing?" (e.g., mild, moderate, severe)    - MILD: No SOB at rest, mild SOB with walking, speaks normally in sentences, can lay down, no retractions, pulse < 100.    - MODERATE: SOB at rest, SOB with minimal exertion and prefers to sit, cannot lie down flat, speaks in phrases, mild retractions, audible wheezing, pulse 100-120.    - SEVERE: Very SOB at rest, speaks in single words, struggling to breathe, sitting hunched forward, retractions, pulse > 120      Moderate to severe 5. RECURRENT SYMPTOM: "Have you had difficulty breathing before?" If so, ask: "When was the last time?" and "What happened that time?"      *No Answer* 6. CARDIAC HISTORY: "Do you have any history of heart disease?" (e.g., heart attack, angina, bypass surgery, angioplasty)     denies 7. LUNG HISTORY: "Do you have any history of lung disease?"  (e.g., pulmonary embolus, asthma, emphysema)   asthma 8. CAUSE: "What do  you think is causing the breathing problem?"       9. OTHER SYMPTOMS: "Do you have any other symptoms? (e.g., dizziness, runny nose, cough, chest pain, fever)    Dizziness after after use of use of inhaler 10. PREGNANCY: "Is there any chance you are pregnant?" "When was your last menstrual period?"       *No Answer* 11. TRAVEL: "Have you traveled out of the country in the last month?" (e.g., travel history, exposures)       *No Answer*  Protocols used: BREATHING DIFFICULTY-A-AH

## 2019-04-20 NOTE — Telephone Encounter (Signed)
Discussed with Liane Comber that she should go to urgent care for this.

## 2019-04-20 NOTE — Telephone Encounter (Signed)
Pt called and said she is having trouble breathing and that she had checked her oxygen levels earlier today and they were around 80 but they were mid 90's when she was on the phone with me but she said she was sitting down, She wasn't sure if she should get tested for Covid which we cant do in office she would have to go to an urgent care, I talked to Dr Zigmund Daniel and he felt she should go see someone in person at an urgent care since we aren't seeing respiratory problems face to face and he felt like it should be in person and not a web visit, Patient was fine with this but she did want to know if she can get a refill on her inhaler since it is 63 years old

## 2019-04-21 ENCOUNTER — Other Ambulatory Visit: Payer: Self-pay | Admitting: Family Medicine

## 2019-04-21 MED ORDER — ALBUTEROL SULFATE HFA 108 (90 BASE) MCG/ACT IN AERS: 2.0000 | INHALATION_SPRAY | Freq: Four times a day (QID) | RESPIRATORY_TRACT | 0 refills | Status: DC | PRN

## 2019-04-21 NOTE — Telephone Encounter (Signed)
Patient returning call to check status of inhaler refill. Advised patient that I did not see a request. Patient was unsure of medication name. Patient also states that she has not went to urgent care as advised and is requesting a call back from CMA to discuss getting an inhaler until she is able to go to UC. Please advise.

## 2019-04-21 NOTE — Telephone Encounter (Signed)
Albuterol hfa sent in.

## 2019-04-22 ENCOUNTER — Other Ambulatory Visit: Payer: Self-pay

## 2019-04-22 ENCOUNTER — Encounter: Payer: Self-pay | Admitting: Podiatry

## 2019-04-22 ENCOUNTER — Other Ambulatory Visit: Payer: Self-pay | Admitting: Family Medicine

## 2019-04-22 ENCOUNTER — Ambulatory Visit (INDEPENDENT_AMBULATORY_CARE_PROVIDER_SITE_OTHER): Payer: Medicare Other | Admitting: Podiatry

## 2019-04-22 VITALS — Temp 97.9°F

## 2019-04-22 DIAGNOSIS — B351 Tinea unguium: Secondary | ICD-10-CM

## 2019-04-22 DIAGNOSIS — E1140 Type 2 diabetes mellitus with diabetic neuropathy, unspecified: Secondary | ICD-10-CM

## 2019-04-22 DIAGNOSIS — M79676 Pain in unspecified toe(s): Secondary | ICD-10-CM | POA: Diagnosis not present

## 2019-04-22 NOTE — Telephone Encounter (Signed)
Dr. Zigmund Daniel okay to send 90days?

## 2019-04-22 NOTE — Progress Notes (Signed)
Subjective: [name], 63 year old female, returns the office today for painful, elongated, thickened toenails which she cannot trim herself. Denies any redness or drainage around the nails. Denies any acute changes since last appointment and no new complaints today. Denies any systemic complaints such as fevers, chills, nausea, vomiting.   Objective: AAO 3, NAD DP/PT pulses palpable, CRT less than 3 seconds Sensation decreased with SWMF.  Nails hypertrophic, dystrophic, elongated, brittle, discolored 9. There is tenderness overlying the nails 1-5 bilaterally except the left hallux nail which has previously been removed. There is no surrounding erythema or drainage along the nail sites.  No open lesions or pre-ulcerative lesions are identified. No pain with calf compression, swelling, warmth, erythema.  Assessment: Patient presents with symptomatic onychomycosis; neuropathy  Plan: -Treatment options including alternatives, risks, complications were discussed -Nails sharply debrided 9 without complication/bleeding. She refuses debridement of left great toe retained spicules. -Discussed daily foot inspection. If there are any changes, to call the office immediately.  -Follow-up in 3 months or sooner if any problems are to arise. In the meantime, encouraged to call the office with any questions, concerns, changes symptoms.  Trula Slade DPM

## 2019-05-03 ENCOUNTER — Telehealth: Payer: Self-pay | Admitting: Internal Medicine

## 2019-05-03 NOTE — Telephone Encounter (Signed)
Will await BI's response.

## 2019-05-03 NOTE — Telephone Encounter (Signed)
Pt calling requesting to switch providers from [facility] to [facility].  Dr. Melvyn Novas, please advise if you are okay with pt switching to Dr. Valeta Harms and Dr. Valeta Harms, please advise if you are okay to take over pt's care. Thanks!

## 2019-05-03 NOTE — Telephone Encounter (Signed)
Fine with me

## 2019-05-04 NOTE — Telephone Encounter (Signed)
Yes, I am ok with it. Thanks Leory Plowman

## 2019-05-04 NOTE — Telephone Encounter (Signed)
Message pending BI response.

## 2019-05-04 NOTE — Telephone Encounter (Signed)
Contacted patient to make appointment with Dr. Valeta Harms as new patient after request to move from [place] to [place] was approved.  Patient informed of no visitor policy for appt and covid questions negative today 05/04/19.  Appt made for June 11 at 2pm.  Patient confirmed.  Nothing further needed.

## 2019-05-19 ENCOUNTER — Ambulatory Visit (INDEPENDENT_AMBULATORY_CARE_PROVIDER_SITE_OTHER): Payer: Medicare Other | Admitting: Family Medicine

## 2019-05-19 ENCOUNTER — Encounter: Payer: Self-pay | Admitting: Family Medicine

## 2019-05-19 ENCOUNTER — Ambulatory Visit: Payer: Medicare Other | Admitting: Family Medicine

## 2019-05-19 ENCOUNTER — Ambulatory Visit: Payer: Medicare Other | Admitting: Pulmonary Disease

## 2019-05-19 DIAGNOSIS — I10 Essential (primary) hypertension: Secondary | ICD-10-CM | POA: Diagnosis not present

## 2019-05-19 DIAGNOSIS — Z794 Long term (current) use of insulin: Secondary | ICD-10-CM

## 2019-05-19 DIAGNOSIS — F17210 Nicotine dependence, cigarettes, uncomplicated: Secondary | ICD-10-CM

## 2019-05-19 DIAGNOSIS — E1122 Type 2 diabetes mellitus with diabetic chronic kidney disease: Secondary | ICD-10-CM

## 2019-05-19 DIAGNOSIS — J449 Chronic obstructive pulmonary disease, unspecified: Secondary | ICD-10-CM | POA: Diagnosis not present

## 2019-05-19 DIAGNOSIS — N183 Chronic kidney disease, stage 3 unspecified: Secondary | ICD-10-CM

## 2019-05-19 DIAGNOSIS — E785 Hyperlipidemia, unspecified: Secondary | ICD-10-CM | POA: Diagnosis not present

## 2019-05-19 MED ORDER — DOXYCYCLINE HYCLATE 100 MG PO TABS: 100.0000 mg | ORAL_TABLET | Freq: Two times a day (BID) | ORAL | 0 refills | Status: DC

## 2019-05-19 MED ORDER — CHANTIX STARTING MONTH PAK 0.5 MG X 11 & 1 MG X 42 PO TABS: ORAL_TABLET | ORAL | 0 refills | Status: DC

## 2019-05-19 MED ORDER — PREDNISONE 20 MG PO TABS: 40.0000 mg | ORAL_TABLET | Freq: Every day | ORAL | 0 refills | Status: AC

## 2019-05-19 NOTE — Assessment & Plan Note (Signed)
-  Blood sugars stable, will update a1c next week.

## 2019-05-19 NOTE — Assessment & Plan Note (Signed)
-  Counseled on smoking cessation, she is ready to quit.  -Will treat current exacerbation with prednison and doxycycline. -Some increased edema so component of volume overload may be contributing as well, she will restart lasix and monitor weights at home.  -I will see her in office on 6/15.  She is aware to seek emergency care for worsening symptoms.

## 2019-05-19 NOTE — Assessment & Plan Note (Signed)
-  BP fairly well controlled based on readings at home.  Will f/u with in office reading next week.  -Continue current medications for now.

## 2019-05-19 NOTE — Assessment & Plan Note (Signed)
-  Ready to quit, will start chantix. -Discussed that she may continue to smoke when first starting medication and to set a quit days 10-14 days into treatment.   -Reviewed side effects and all questions regarding medication answered.

## 2019-05-19 NOTE — Progress Notes (Signed)
[name] - 63 y.o. female MRN [id number]  Date of birth: 1956-07-31   This visit type was conducted due to national recommendations for restrictions regarding the COVID-19 Pandemic (e.g. social distancing).  This format is felt to be most appropriate for this patient at this time.  All issues noted in this document were discussed and addressed.  No physical exam was performed (except for noted visual exam findings with Video Visits).  I discussed the limitations of evaluation and management by telemedicine and the availability of in person appointments. The patient expressed understanding and agreed to proceed.  I connected with@ on 05/19/19 at 10:00 AM EDT by a video enabled telemedicine application and verified that I am speaking with the correct person using two identifiers.   Patient Location: Home [place] [address]   Provider location:   Home office  Chief Complaint  Patient presents with  . Follow-up    3 mo F/U HTN/DM , FBS ranges119-168, BP 110's- 140's/70's-110    HPI  [name] is a 63 y.o. female who presents via audio/video conferencing for a telehealth visit today.  She is following up on chronic problems of HTN and DM today.  She also has a history of CHF and COPD and reports increased dyspnea.  This started about 1 week ago.  She has had associated wheezing and mild sputum production.  O2 sats at home around 91%.  She also reports increased LE edema.  She denies chest tightness, pain or orthopnea.  She has not been taking lasix recently. She gets temporary relief from albuterol.   She denies fever, chills. She would like to quit smoking at this point as well.    In regards to her blood sugars she reports that these have been "ok".  Ranging from 120-170.  She denies hypoglycemia.  Diet is still fairly poor. She does not exercise.  She is compliant with statin for management of lipids.    Her BP has ranged from 110-140/70-110 at home.   She denies symptoms of hypertension at this time including headache, vision change, or chest pain.  She denies symptoms of hypotension as well   ROS:  A comprehensive ROS was completed and negative except as noted per HPI  Past Medical History:  Diagnosis Date  . Anxiety    doesn't take any meds for this  . Arteriosclerosis of coronary artery 08/31/2011   Myoview 9/12: EF 44, no ischemia // Myoview 8/18: EF 57, low risk, possible small area of inferior ischemia // Minor nonobstructive CAD by cardiac catheterization - LHC 8/18: Proximal LAD 35, OM2 25  . Arthritis    knuckles  . Asthma   . Carotid artery disease ([place])    Carotid US 7/14: Bilateral ICA 40-59 // Carotid US ([facility]) 2/17: bilat plaque without sig stenosis  . Chronic diastolic CHF (congestive heart failure) ([place]) 07/26/2017   Echo 7/18:Severe conc LVH, EF 60-65, no RWMA, Gr 2 DD, MAC, mild MR // right and left heart cath 8/18: LVEDP 21  . Claustrophobia 07/26/2012  . COPD (chronic obstructive pulmonary disease) ([place])    "chronic bronchitis - about every winter"  . CSF leak    dizziness/headache - assoc symptoms // 2/2 encephalocele s/p skull base reconstruction  . Diverticulosis   . History of CVA (cerebrovascular accident) 03/2004   denies residual 07/26/2012  . Hx of gout   . Hyperlipidemia    takes Pravastatin daily  . Hypertensive heart disease with chronic diastolic  congestive heart failure ([place]) 07/26/2017  . Internal hemorrhoids   . Kidney stones   . LVH (left ventricular hypertrophy)    Echo 8/18: Severe conc LVH, EF 60-65, no RWMA, Gr 2 DD, MAC, mild MR  . OSA (obstructive sleep apnea) 07/26/2012   prior CPAP - stopped due to being primary caregiver for dying parent (stopped in 2016)  . PSVT (paroxysmal supraventricular tachycardia) ([place])   . Pulmonary hypertension, unspecified ([place]) 08/14/2017   Right heart cath 8/13:RVSP 66, PASP 57, mean PA 39, mean PWCP 18, LVEDP 21  . Type II diabetes mellitus ([place])      Past Surgical History:  Procedure Laterality Date  . ESOPHAGOGASTRODUODENOSCOPY (EGD) WITH PROPOFOL N/A 07/13/2015   Procedure: ESOPHAGOGASTRODUODENOSCOPY (EGD) WITH PROPOFOL;  Surgeon: Carol Ada, MD;  [facility];  Service: Endoscopy;  Laterality: N/A;  . NASAL SINUS SURGERY  01/29/2012   Procedure: ENDOSCOPIC SINUS SURGERY WITH STEALTH;  Surgeon: Ruby Cola, MD;  Location: [facility];  Service: ENT;  Laterality: N/A;  . NM MYOVIEW LTD  08/25/2011   Low risk study, no evidence of ischemia, EF 44%  . REFRACTIVE SURGERY  ~ 2010   left  . RIGHT/LEFT HEART CATH AND CORONARY ANGIOGRAPHY N/A 07/30/2017   Procedure: RIGHT/LEFT HEART CATH AND CORONARY ANGIOGRAPHY;  Surgeon: Martinique, Peter M, MD;  Location: [facility];  Service: Cardiovascular;  Laterality: N/A;  . SPHENOIDECTOMY  01/29/2012   Procedure: SPHENOIDECTOMY;  Surgeon: Ruby Cola, MD;  Location: [facility];  Service: ENT;  Laterality: Left;  REPAIR OF LEFT SPHENOID    . TOTAL ABDOMINAL HYSTERECTOMY  1994    Family History  Problem Relation Age of Onset  . Colon cancer Sister 58  . Breast cancer Mother 80  . Alzheimer's disease Mother   . Diabetes Sister 55  . Heart failure Sister   . Diabetes Sister 6  . Lung cancer Father   . Breast cancer Sister   . Cancer Sister        mouth  . Anesthesia problems Neg Hx   . Hypotension Neg Hx   . Malignant hyperthermia Neg Hx   . Pseudochol deficiency Neg Hx     Social History   Socioeconomic History  . Marital status: Single    Spouse name: Not on file  . Number of children: 0  . Years of education: Not on file  . Highest education level: Not on file  Occupational History  . Occupation: disabled  Social Needs  . Financial resource strain: Not on file  . Food insecurity    Worry: Not on file    Inability: Not on file  . Transportation needs    Medical: Not on file    Non-medical: Not on file  Tobacco Use  . Smoking status: Current Every Day Smoker     Packs/day: 0.50    Years: 35.00    Pack years: 17.50    Types: Cigarettes  . Smokeless tobacco: Never Used  . Tobacco comment: 07/26/2012 has decreased smoking from 2ppd; offered cessation materials; pt declines  Substance and Sexual Activity  . Alcohol use: Yes    Alcohol/week: 1.0 standard drinks    Types: 1 Glasses of wine per week    Comment: rarely  . Drug use: No  . Sexual activity: Never    Birth control/protection: Surgical  Lifestyle  . Physical activity    Days per week: Not on file    Minutes per session: Not on file  . Stress:  Not on file  Relationships  . Social Herbalist on phone: Not on file    Gets together: Not on file    Attends religious service: Not on file    Active member of club or organization: Not on file    Attends meetings of clubs or organizations: Not on file    Relationship status: Not on file  . Intimate partner violence    Fear of current or ex partner: Not on file    Emotionally abused: Not on file    Physically abused: Not on file    Forced sexual activity: Not on file  Other Topics Concern  . Not on file  Social History Narrative   Currently unemployed.  Really wants to find a job, but has been difficult for her.  No children, not married     Current Outpatient Medications:  .  acetaminophen (TYLENOL) 325 MG tablet, Take 2 tablets (650 mg total) by mouth every 6 (six) hours as needed for mild pain., Disp: 90 tablet, Rfl: 2 .  albuterol (VENTOLIN HFA) 108 (90 Base) MCG/ACT inhaler, Inhale 2 puffs into the lungs every 6 (six) hours as needed for wheezing or shortness of breath., Disp: 1 Inhaler, Rfl: 0 .  allopurinol (ZYLOPRIM) 100 MG tablet, , Disp: , Rfl:  .  aspirin EC 81 MG tablet, Take 1 tablet (81 mg total) by mouth daily., Disp: 90 tablet, Rfl: 3 .  Blood Glucose Monitoring Suppl (ACCU-CHEK AVIVA PLUS) w/Device KIT, Patient is to test four times daily. Dx. E11.42, Disp: 1 kit, Rfl: 1 .  busPIRone (BUSPAR) 15 MG tablet,  Take 15 mg by mouth 2 (two) times daily. , Disp: , Rfl:  .  butalbital-acetaminophen-caffeine (FIORICET, ESGIC) 50-325-40 MG tablet, Take 1 tablet by mouth every 6 (six) hours as needed for headache. Do not take with tylenol, Disp: 10 tablet, Rfl: 0 .  dicyclomine (BENTYL) 20 MG tablet, TAKE 1 TABLET 20-30 MINUTES BEFORE MEALS IF YOU ARE EATING, OTHERWISE JUST TAKE FOUR TIMES DAILY. (Patient taking differently: Take by mouth See admin instructions. TAKE 1 TABLET 20-30 MINUTES BEFORE MEALS IF YOU ARE EATING, OTHERWISE JUST TAKE FOUR TIMES DAILY.), Disp: 360 tablet, Rfl: 1 .  FLUoxetine (PROZAC) 10 MG capsule, Take 4 capsules (40 mg total) by mouth at bedtime., Disp: 120 capsule, Rfl: 3 .  gabapentin (NEURONTIN) 400 MG capsule, TAKE 1 CAPSULE BY MOUTH THREE TIMES A DAY, Disp: 270 capsule, Rfl: 2 .  glucose blood (ACCU-CHEK AVIVA) test strip, Patient is to test four times daily. Dx. E11.42, Disp: 100 each, Rfl: 12 .  insulin aspart (NOVOLOG FLEXPEN) 100 UNIT/ML FlexPen, Inject 30-40 Units into the skin 3 (three) times daily with meals., Disp: 10 pen, Rfl: 5 .  Insulin Glargine (TOUJEO SOLOSTAR) 300 UNIT/ML SOPN, Inject 110 Units into the skin 2 (two) times daily. (Patient taking differently: Inject 120 Units into the skin 2 (two) times daily. ), Disp: 8 pen, Rfl: 3 .  isosorbide mononitrate (IMDUR) 60 MG 24 hr tablet, TAKE 1 TABLET (60 MG TOTAL) BY MOUTH DAILY. ** DO NOT CRUSH **, Disp: 90 tablet, Rfl: 0 .  levalbuterol (XOPENEX HFA) 45 MCG/ACT inhaler, Inhale 1-2 puffs into the lungs every 6 (six) hours as needed for wheezing or shortness of breath., Disp: 1 Inhaler, Rfl: 3 .  losartan (COZAAR) 25 MG tablet, Take 25 mg by mouth daily., Disp: , Rfl: 3 .  mometasone-formoterol (DULERA) 100-5 MCG/ACT AERO, Take 2 puffs first thing in am  and then another 2 puffs about 12 hours later., Disp: 1 Inhaler, Rfl: 0 .  nitroGLYCERIN (NITROSTAT) 0.4 MG SL tablet, Place 1 tablet (0.4 mg total) under the tongue every 5  (five) minutes as needed., Disp: 25 tablet, Rfl: 3 .  omeprazole (PRILOSEC) 40 MG capsule, Take 30- 60 min before your first and last meals of the day, Disp: 60 capsule, Rfl: 2 .  ondansetron (ZOFRAN) 4 MG tablet, Take 1 tablet (4 mg total) by mouth every 8 (eight) hours as needed for nausea or vomiting., Disp: 20 tablet, Rfl: 0 .  potassium chloride SA (KLOR-CON M20) 20 MEQ tablet, Take 1 tablet (20 mEq total) by mouth daily. Please make overdue appt with Dr. Harrington Challenger before anymore refills. 1st attempt, Disp: 30 tablet, Rfl: 0 .  topiramate (TOPAMAX) 25 MG tablet, Take 25 mg by mouth 2 (two) times daily. , Disp: , Rfl:  .  valsartan (DIOVAN) 40 MG tablet, , Disp: , Rfl:  .  Verapamil HCl CR 300 MG CP24, Take 1 capsule (300 mg total) by mouth daily. Please call to schedule overdue appt with Dr Harrington Challenger for future refills. (3rd attempt), Disp: 15 capsule, Rfl: 0 .  furosemide (LASIX) 40 MG tablet, Take 1 tablet (40 mg total) by mouth daily., Disp: 90 tablet, Rfl: 1 .  isosorbide mononitrate (IMDUR) 60 MG 24 hr tablet, TAKE 1 TABLET (60 MG TOTAL) BY MOUTH DAILY FOR 30 DAYS. ** DO NOT CRUSH **, Disp: 30 tablet, Rfl: 3 .  rosuvastatin (CRESTOR) 20 MG tablet, Take 1 tablet (20 mg total) by mouth daily., Disp: 90 tablet, Rfl: 2  EXAM:  VITALS per patient if applicable: BP 952/84 Comment: BP @ hm  Temp 97.6 F (36.4 C) (Oral)   Ht 5' 2.5" (1.588 m)   Wt 224 lb (101.6 kg)   BMI 40.32 kg/m   GENERAL: alert, oriented, appears well and in no acute distress  HEENT: atraumatic, conjunttiva clear, no obvious abnormalities on inspection of external nose and ears  NECK: normal movements of the head and neck  LUNGS: on inspection no signs of respiratory distress, breathing rate appears normal, no obvious gross SOB, gasping or wheezing  CV: no obvious cyanosis  MS: moves all visible extremities without noticeable abnormality  PSYCH/NEURO: pleasant and cooperative, no obvious depression or anxiety, speech and  thought processing grossly intact  ASSESSMENT AND PLAN:  Discussed the following assessment and plan:  Essential hypertension -BP fairly well controlled based on readings at home.  Will f/u with in office reading next week.  -Continue current medications for now.   COPD  GOLD 0 still smoking -Counseled on smoking cessation, she is ready to quit.  -Will treat current exacerbation with prednison and doxycycline. -Some increased edema so component of volume overload may be contributing as well, she will restart lasix and monitor weights at home.  -I will see her in office on 6/15.  She is aware to seek emergency care for worsening symptoms.   Type 2 diabetes mellitus with stage 3 chronic kidney disease, with long-term current use of insulin (HCC) -Blood sugars stable, will update a1c next week.   Nicotine dependence, cigarettes, uncomplicated -Ready to quit, will start chantix. -Discussed that she may continue to smoke when first starting medication and to set a quit days 10-14 days into treatment.   -Reviewed side effects and all questions regarding medication answered.   HLD (hyperlipidemia) -Tolerating crestor well, continue.        I discussed the assessment  and treatment plan with the patient. The patient was provided an opportunity to ask questions and all were answered. The patient agreed with the plan and demonstrated an understanding of the instructions.   The patient was advised to call back or seek an in-person evaluation if the symptoms worsen or if the condition fails to improve as anticipated.   Luetta Nutting, DO

## 2019-05-19 NOTE — Assessment & Plan Note (Signed)
-  Tolerating crestor well, continue.

## 2019-05-23 ENCOUNTER — Encounter: Payer: Self-pay | Admitting: Family Medicine

## 2019-05-23 ENCOUNTER — Ambulatory Visit (INDEPENDENT_AMBULATORY_CARE_PROVIDER_SITE_OTHER): Payer: Medicare Other | Admitting: Family Medicine

## 2019-05-23 VITALS — BP 142/92 | HR 85 | Temp 97.5°F | Resp 22 | Ht 62.5 in | Wt 245.8 lb

## 2019-05-23 DIAGNOSIS — N183 Chronic kidney disease, stage 3 (moderate): Secondary | ICD-10-CM

## 2019-05-23 DIAGNOSIS — E1122 Type 2 diabetes mellitus with diabetic chronic kidney disease: Secondary | ICD-10-CM

## 2019-05-23 DIAGNOSIS — Z794 Long term (current) use of insulin: Secondary | ICD-10-CM

## 2019-05-23 DIAGNOSIS — R609 Edema, unspecified: Secondary | ICD-10-CM | POA: Diagnosis not present

## 2019-05-23 DIAGNOSIS — J441 Chronic obstructive pulmonary disease with (acute) exacerbation: Secondary | ICD-10-CM

## 2019-05-23 DIAGNOSIS — I10 Essential (primary) hypertension: Secondary | ICD-10-CM

## 2019-05-23 DIAGNOSIS — F17210 Nicotine dependence, cigarettes, uncomplicated: Secondary | ICD-10-CM | POA: Diagnosis not present

## 2019-05-23 DIAGNOSIS — R0600 Dyspnea, unspecified: Secondary | ICD-10-CM | POA: Diagnosis not present

## 2019-05-23 DIAGNOSIS — I129 Hypertensive chronic kidney disease with stage 1 through stage 4 chronic kidney disease, or unspecified chronic kidney disease: Secondary | ICD-10-CM | POA: Diagnosis not present

## 2019-05-23 LAB — COMPREHENSIVE METABOLIC PANEL
ALT: 29 U/L (ref 0–35)
AST: 25 U/L (ref 0–37)
Albumin: 3.9 g/dL (ref 3.5–5.2)
Alkaline Phosphatase: 79 U/L (ref 39–117)
BUN: 14 mg/dL (ref 6–23)
CO2: 28 mEq/L (ref 19–32)
Calcium: 9.5 mg/dL (ref 8.4–10.5)
Chloride: 102 mEq/L (ref 96–112)
Creatinine, Ser: 0.96 mg/dL (ref 0.40–1.20)
GFR: 58.88 mL/min — ABNORMAL LOW (ref >60.00)
Glucose, Bld: 137 mg/dL — ABNORMAL HIGH (ref 70–99)
Potassium: 4.1 mEq/L (ref 3.5–5.1)
Sodium: 139 mEq/L (ref 135–145)
Total Bilirubin: 0.5 mg/dL (ref 0.2–1.2)
Total Protein: 6.7 g/dL (ref 6.0–8.3)

## 2019-05-23 LAB — LIPID PANEL
Cholesterol: 124 mg/dL (ref 0–200)
HDL: 43.1 mg/dL (ref >39.00)
LDL Cholesterol: 68 mg/dL (ref 0–99)
NonHDL: 80.47
Total CHOL/HDL Ratio: 3
Triglycerides: 62 mg/dL (ref 0.0–149.0)
VLDL: 12.4 mg/dL (ref 0.0–40.0)

## 2019-05-23 LAB — HEMOGLOBIN A1C: Hgb A1c MFr Bld: 8.2 % — ABNORMAL HIGH (ref 4.6–6.5)

## 2019-05-23 LAB — BRAIN NATRIURETIC PEPTIDE: Pro B Natriuretic peptide (BNP): 1442 pg/mL — ABNORMAL HIGH (ref 0.0–100.0)

## 2019-05-23 NOTE — Assessment & Plan Note (Signed)
-  dypsnea improved with treatment of COPD exacerbation.  Will also check BNP with edema but appears euvolemic today.

## 2019-05-23 NOTE — Progress Notes (Signed)
[name] - 63 y.o. female MRN [id number]  Date of birth: Aug 02, 1956  Subjective Chief Complaint  Patient presents with  . COPD    eposidic exacerbation onset 4 days ,     HPI [name] is a 63 y.o. female here today for follow up of T2DM and dyspnea.  She was seen via video visit last week with symptoms consistent with COPD exacerbation.  She may have had some mild volume overload as well as she reported increased edema.  She was started on doxycycline and prednisone burst at that time.  Her lasix was also restarted for now.  She states today that she is feeling better and that this is "the best she has felt in a long time".  She denies continued wheezing and O2 sats have improved.  She denies chest pain/tightness, fever, chills, nausea, palpitations.   Edema has improved some.  In regards to her diabetes she reports that blood sugar this morning was 148.  Ranges from 130-300, depending on what she has had to eat. She denies hypoglycemia.    ROS:  A comprehensive ROS was completed and negative except as noted per HPI  Allergies  Allergen Reactions  . Atorvastatin Other (See Comments) and Nausea And Vomiting    "legs cramped; moderate to almost severe" "legs cramped; moderate to almost severe"  . Ibuprofen Other (See Comments)    Other reaction(s): Other (See Comments) Per pt, MD doesn't want pt to take Per pt, MD doesn't want pt to take  . Metformin And Related Nausea And Vomiting  . Ace Inhibitors Cough and Nausea And Vomiting  . Hydrocodone-Acetaminophen Nausea And Vomiting  . Albuterol Other (See Comments)    Caused SVT    Past Medical History:  Diagnosis Date  . Anxiety    doesn't take any meds for this  . Arteriosclerosis of coronary artery 08/31/2011   Myoview 9/12: EF 44, no ischemia // Myoview 8/18: EF 57, low risk, possible small area of inferior ischemia // Minor nonobstructive CAD by cardiac catheterization - LHC 8/18: Proximal LAD 35, OM2 25  .  Arthritis    knuckles  . Asthma   . Carotid artery disease ([place])    Carotid US 7/14: Bilateral ICA 40-59 // Carotid US ([facility]) 2/17: bilat plaque without sig stenosis  . Chronic diastolic CHF (congestive heart failure) ([place]) 07/26/2017   Echo 7/18:Severe conc LVH, EF 60-65, no RWMA, Gr 2 DD, MAC, mild MR // right and left heart cath 8/18: LVEDP 21  . Claustrophobia 07/26/2012  . COPD (chronic obstructive pulmonary disease) ([place])    "chronic bronchitis - about every winter"  . CSF leak    dizziness/headache - assoc symptoms // 2/2 encephalocele s/p skull base reconstruction  . Diverticulosis   . History of CVA (cerebrovascular accident) 03/2004   denies residual 07/26/2012  . Hx of gout   . Hyperlipidemia    takes Pravastatin daily  . Hypertensive heart disease with chronic diastolic congestive heart failure ([place]) 07/26/2017  . Internal hemorrhoids   . Kidney stones   . LVH (left ventricular hypertrophy)    Echo 8/18: Severe conc LVH, EF 60-65, no RWMA, Gr 2 DD, MAC, mild MR  . OSA (obstructive sleep apnea) 07/26/2012   prior CPAP - stopped due to being primary caregiver for dying parent (stopped in 2016)  . PSVT (paroxysmal supraventricular tachycardia) ([place])   . Pulmonary hypertension, unspecified ([place]) 08/14/2017   Right heart cath 8/13:RVSP 66, PASP 57, mean PA 39,  mean PWCP 18, LVEDP 21  . Type II diabetes mellitus ([place])     Past Surgical History:  Procedure Laterality Date  . ESOPHAGOGASTRODUODENOSCOPY (EGD) WITH PROPOFOL N/A 07/13/2015   Procedure: ESOPHAGOGASTRODUODENOSCOPY (EGD) WITH PROPOFOL;  Surgeon: Carol Ada, MD;  [facility];  Service: Endoscopy;  Laterality: N/A;  . NASAL SINUS SURGERY  01/29/2012   Procedure: ENDOSCOPIC SINUS SURGERY WITH STEALTH;  Surgeon: Ruby Cola, MD;  Location: [facility];  Service: ENT;  Laterality: N/A;  . NM MYOVIEW LTD  08/25/2011   Low risk study, no evidence of ischemia, EF 44%  . REFRACTIVE SURGERY  ~ 2010   left  . RIGHT/LEFT  HEART CATH AND CORONARY ANGIOGRAPHY N/A 07/30/2017   Procedure: RIGHT/LEFT HEART CATH AND CORONARY ANGIOGRAPHY;  Surgeon: Martinique, Peter M, MD;  Location: [facility];  Service: Cardiovascular;  Laterality: N/A;  . SPHENOIDECTOMY  01/29/2012   Procedure: SPHENOIDECTOMY;  Surgeon: Ruby Cola, MD;  Location: [facility];  Service: ENT;  Laterality: Left;  REPAIR OF LEFT SPHENOID    . TOTAL ABDOMINAL HYSTERECTOMY  1994    Social History   Socioeconomic History  . Marital status: Single    Spouse name: Not on file  . Number of children: 0  . Years of education: Not on file  . Highest education level: Not on file  Occupational History  . Occupation: disabled  Social Needs  . Financial resource strain: Not on file  . Food insecurity    Worry: Not on file    Inability: Not on file  . Transportation needs    Medical: Not on file    Non-medical: Not on file  Tobacco Use  . Smoking status: Current Every Day Smoker    Packs/day: 0.50    Years: 35.00    Pack years: 17.50    Types: Cigarettes  . Smokeless tobacco: Never Used  . Tobacco comment: 07/26/2012 has decreased smoking from 2ppd; offered cessation materials; pt declines  Substance and Sexual Activity  . Alcohol use: Yes    Alcohol/week: 1.0 standard drinks    Types: 1 Glasses of wine per week    Comment: rarely  . Drug use: No  . Sexual activity: Never    Birth control/protection: Surgical  Lifestyle  . Physical activity    Days per week: Not on file    Minutes per session: Not on file  . Stress: Not on file  Relationships  . Social Herbalist on phone: Not on file    Gets together: Not on file    Attends religious service: Not on file    Active member of club or organization: Not on file    Attends meetings of clubs or organizations: Not on file    Relationship status: Not on file  Other Topics Concern  . Not on file  Social History Narrative   Currently unemployed.  Really wants to find a job, but has  been difficult for her.  No children, not married    Family History  Problem Relation Age of Onset  . Colon cancer Sister 19  . Breast cancer Mother 61  . Alzheimer's disease Mother   . Diabetes Sister 77  . Heart failure Sister   . Diabetes Sister 50  . Lung cancer Father   . Breast cancer Sister   . Cancer Sister        mouth  . Anesthesia problems Neg Hx   . Hypotension Neg Hx   .  Malignant hyperthermia Neg Hx   . Pseudochol deficiency Neg Hx     Health Maintenance  Topic Date Due  . HIV Screening  04/26/1972  . PAP SMEAR-Modifier  04/26/1978  . OPHTHALMOLOGY EXAM  03/04/2014  . FOOT EXAM  06/20/2015  . MAMMOGRAM  06/25/2018  . INFLUENZA VACCINE  07/09/2019  . HEMOGLOBIN A1C  08/20/2019  . COLONOSCOPY  10/09/2019  . TETANUS/TDAP  03/29/2025  . PNEUMOCOCCAL POLYSACCHARIDE VACCINE AGE 28-64 HIGH RISK  Completed  . Hepatitis C Screening  Completed    ----------------------------------------------------------------------------------------------------------------------------------------------------------------------------------------------------------------- Physical Exam BP (!) 142/92   Pulse 85   Temp (!) 97.5 F (36.4 C) (Oral)   Resp (!) 22   Ht 5' 2.5" (1.588 m)   Wt 245 lb 12.8 oz (111.5 kg)   SpO2 94%   BMI 44.24 kg/m   Physical Exam Constitutional:      Appearance: Normal appearance.  HENT:     Head: Normocephalic and atraumatic.     Mouth/Throat:     Mouth: Mucous membranes are moist.  Eyes:     General: No scleral icterus. Neck:     Musculoskeletal: Neck supple.  Cardiovascular:     Rate and Rhythm: Normal rate and regular rhythm.  Pulmonary:     Effort: Pulmonary effort is normal.     Breath sounds: Normal breath sounds.  Musculoskeletal:     Right lower leg: Edema (1+) present.     Left lower leg: Edema (1+) present.  Skin:    General: Skin is warm and dry.     Findings: No rash.  Neurological:     General: No focal deficit present.      Mental Status: She is alert.  Psychiatric:        Mood and Affect: Mood normal.        Behavior: Behavior normal.     ------------------------------------------------------------------------------------------------------------------------------------------------------------------------------------------------------------------- Assessment and Plan  Essential hypertension -BP has been fairly well controlled, she will continue current medications.   COPD exacerbation (HCC) -Respiratory symptoms improving with doxycycline and prednisone.  -She will complete this and continue albuterol as needed.  -Discussed need to quit smoking.  Chantix sent in at last appt.   Type 2 diabetes mellitus with stage 3 chronic kidney disease, with long-term current use of insulin ([place]) -Update a1c and renal function today.   Nicotine dependence, cigarettes, uncomplicated -Counseling provided for smoking cessation -Ready to quit and already has chantix.  -She will f/u in 6 weeks.   5 min spent providing counseling.   Dyspnea -dypsnea improved with treatment of COPD exacerbation.  Will also check BNP with edema but appears euvolemic today.

## 2019-05-23 NOTE — Assessment & Plan Note (Signed)
-  Respiratory symptoms improving with doxycycline and prednisone.  -She will complete this and continue albuterol as needed.  -Discussed need to quit smoking.  Chantix sent in at last appt.

## 2019-05-23 NOTE — Assessment & Plan Note (Signed)
-  Update a1c and renal function today.

## 2019-05-23 NOTE — Assessment & Plan Note (Addendum)
-  Counseling provided for smoking cessation -Ready to quit and already has chantix.  -She will f/u in 6 weeks.   5 min spent providing counseling.

## 2019-05-23 NOTE — Assessment & Plan Note (Signed)
-  BP has been fairly well controlled, she will continue current medications.

## 2019-05-23 NOTE — Patient Instructions (Addendum)
Great to see you, I'm glad you are feeling better.   Complete antibiotics and steroid.  Let me know if symptoms are worsening again.   I will plan to see you back in about 6 weeks.   I would recommend following up with your lung and heart specialists as well

## 2019-05-25 NOTE — Progress Notes (Signed)
-  A1c is up to 8.2%, I would like for her to see endocrinology and see if we can work on getting this back down -BNP (marker for extra fluid/heart failure) is up some.  Lets have her continue lasix for now.  Recheck BMP in 2 weeks.  (please order and schedule lab visit) -Good news though is cholesterol looks great!  She should continue current medication for this.

## 2019-05-28 ENCOUNTER — Other Ambulatory Visit: Payer: Self-pay | Admitting: Family Medicine

## 2019-06-02 ENCOUNTER — Other Ambulatory Visit: Payer: Self-pay | Admitting: Family Medicine

## 2019-06-02 ENCOUNTER — Telehealth: Payer: Self-pay | Admitting: Family Medicine

## 2019-06-02 MED ORDER — PREDNISONE 20 MG PO TABS: 40.0000 mg | ORAL_TABLET | Freq: Every day | ORAL | 0 refills | Status: AC

## 2019-06-02 NOTE — Telephone Encounter (Signed)
Refilled

## 2019-06-02 NOTE — Telephone Encounter (Signed)
Called Pt , she stated Prednisone 20mg , helped with her breathing. Requesting Refill. COPD is getting better, but started to increased SOB durning the night , disrupted pm sleep.

## 2019-06-02 NOTE — Telephone Encounter (Signed)
Medication Refill - Medication: Predisone  Has the patient contacted their pharmacy? Yes.  Pt called stating she would like a call from a nurse. Please advise.  (Agent: If no, request that the patient contact the pharmacy for the refill.) (Agent: If yes, when and what did the pharmacy advise?)  Preferred Pharmacy (with phone number or street name):  [id number] - [place], [place] [address]  Phone: [phone] Fax: [phone]  Not a 24 hour pharmacy; exact hours not known.     Agent: Please be advised that RX refills may take up to 3 business days. We ask that you follow-up with your pharmacy.

## 2019-06-24 ENCOUNTER — Telehealth: Payer: Self-pay

## 2019-06-24 NOTE — Telephone Encounter (Signed)
  During this illness, did/does the patient experience any of the following symptoms? Fever >100.33F []   Yes [x]   No []   Unknown Subjective fever (felt feverish) []   Yes [x]   No []   Unknown Chills []   Yes [x]   No []   Unknown Muscle aches (myalgia) []   Yes [x]   No []   Unknown Runny nose (rhinorrhea) []   Yes [x]   No []   Unknown Sore throat []   Yes [x]   No []   Unknown Cough (new onset or worsening of chronic cough) []   Yes [x]   No []   Unknown Shortness of breath (dyspnea) []   Yes [x]   No []   Unknown Nausea or vomiting []   Yes [x]   No []   Unknown Headache []   Yes [x]   No []   Unknown Abdominal pain  []   Yes [x]   No []   Unknown Diarrhea (?3 loose/looser than normal stools/24hr period) []   Yes [x]   No []   Unknown Other, specify: ADVISED TO CALL OFFICE BEFORE COMING IN FOR APPT IF ANY OF THESE SYMPTOMS PRESENT OVER THE WEEKEND.

## 2019-06-27 ENCOUNTER — Other Ambulatory Visit: Payer: Self-pay | Admitting: Family Medicine

## 2019-06-27 ENCOUNTER — Other Ambulatory Visit: Payer: Self-pay

## 2019-06-27 ENCOUNTER — Other Ambulatory Visit (INDEPENDENT_AMBULATORY_CARE_PROVIDER_SITE_OTHER): Payer: Medicare Other

## 2019-06-27 DIAGNOSIS — J441 Chronic obstructive pulmonary disease with (acute) exacerbation: Secondary | ICD-10-CM

## 2019-06-27 DIAGNOSIS — I10 Essential (primary) hypertension: Secondary | ICD-10-CM

## 2019-06-27 LAB — BASIC METABOLIC PANEL
BUN: 23 mg/dL (ref 6–23)
CO2: 28 mEq/L (ref 19–32)
Calcium: 8.7 mg/dL (ref 8.4–10.5)
Chloride: 98 mEq/L (ref 96–112)
Creatinine, Ser: 1.7 mg/dL — ABNORMAL HIGH (ref 0.40–1.20)
GFR: 30.44 mL/min — ABNORMAL LOW (ref >60.00)
Glucose, Bld: 265 mg/dL — ABNORMAL HIGH (ref 70–99)
Potassium: 4.4 mEq/L (ref 3.5–5.1)
Sodium: 137 mEq/L (ref 135–145)

## 2019-06-27 NOTE — Progress Notes (Signed)
Creatinine is elevated.  Please have her hold lasix for now and we'll recheck at appt next week.  Bring all medications that she is taking to appt next week as well.

## 2019-07-01 ENCOUNTER — Telehealth: Payer: Self-pay

## 2019-07-01 NOTE — Telephone Encounter (Signed)
Questions for Screening COVID-19  Symptom onset: No, Prescreen,#[id number]  Travel or Contacts: None  During this illness, did/does the patient experience any of the following symptoms? Fever >100.24F []   Yes [x]   No []   Unknown Subjective fever (felt feverish) []   Yes [x]   No []   Unknown Chills []   Yes [x]   No []   Unknown Muscle aches (myalgia) []   Yes [x]   No []   Unknown Runny nose (rhinorrhea) []   Yes [x]   No []   Unknown Sore throat []   Yes [x]   No []   Unknown Cough (new onset or worsening of chronic cough) []   Yes [x]   No []   Unknown Shortness of breath (dyspnea) []   Yes [x]   No []   Unknown Nausea or vomiting []   Yes [x]   No []   Unknown Headache []   Yes [x]   No []   Unknown Abdominal pain  []   Yes [x]   No []   Unknown Diarrhea (?3 loose/looser than normal stools/24hr period) []   Yes [x]   No []   Unknown Other, specify:  Patient risk factors: Smoker? []   Current []   Former [x]   Never If female, currently pregnant? []   Yes [x]   No  Patient Active Problem List   Diagnosis Date Noted  . Edema 05/23/2019  . Pneumonia 02/07/2019  . COPD exacerbation ([place]) 01/05/2019  . Type 2 diabetes mellitus with stage 3 chronic kidney disease, with long-term current use of insulin ([place]) 11/18/2018  . Screening for breast cancer 06/30/2018  . Nicotine dependence, cigarettes, uncomplicated 65/53/7482  . Pulmonary hypertension, unspecified ([place]) 08/14/2017  . Hypertensive heart disease with chronic diastolic congestive heart failure ([place]) 07/26/2017  . Paroxysmal SVT (supraventricular tachycardia) ([place]) 07/26/2017  . COPD  GOLD 0 still smoking 07/13/2017  . Moderate episode of recurrent major depressive disorder ([place]) 07/12/2015  . OSA (obstructive sleep apnea) 10/17/2014  . Dyspnea 03/16/2014  . Pain in joint, multiple sites 09/21/2013  . Carotid artery disease ([place]) 06/03/2013  . Dizziness 02/16/2013  . Depression 12/12/2012  . Uric acid kidney stone 07/04/2012  . CSF leak from nose 10/19/2011  .  Arteriosclerosis of coronary artery 08/31/2011  . Type 2 diabetes mellitus with diabetic polyneuropathy, with long-term current use of insulin ([place]) 08/31/2011  . HLD (hyperlipidemia) 07/02/2011  . Essential hypertension 06/20/2011  . Morbid obesity due to excess calories ([place]) 06/20/2011    Plan:  []   High risk for COVID-19 with red flags go to ED (with CP, SOB, weak/lightheaded, or fever > 101.5). Call ahead.  []   High risk for COVID-19 but stable. Inform provider and coordinate time for [facility] visit.   [x]   No red flags but URI signs or symptoms okay for [facility] visit.

## 2019-07-04 ENCOUNTER — Encounter: Payer: Self-pay | Admitting: Family Medicine

## 2019-07-04 ENCOUNTER — Other Ambulatory Visit: Payer: Self-pay

## 2019-07-04 ENCOUNTER — Ambulatory Visit (INDEPENDENT_AMBULATORY_CARE_PROVIDER_SITE_OTHER): Payer: Medicare Other | Admitting: Family Medicine

## 2019-07-04 VITALS — BP 152/96 | HR 88 | Temp 97.8°F | Resp 24 | Ht 62.5 in | Wt 249.8 lb

## 2019-07-04 DIAGNOSIS — N183 Chronic kidney disease, stage 3 (moderate): Secondary | ICD-10-CM | POA: Diagnosis not present

## 2019-07-04 DIAGNOSIS — F331 Major depressive disorder, recurrent, moderate: Secondary | ICD-10-CM | POA: Diagnosis not present

## 2019-07-04 DIAGNOSIS — J441 Chronic obstructive pulmonary disease with (acute) exacerbation: Secondary | ICD-10-CM

## 2019-07-04 DIAGNOSIS — I471 Supraventricular tachycardia: Secondary | ICD-10-CM | POA: Diagnosis not present

## 2019-07-04 DIAGNOSIS — E1122 Type 2 diabetes mellitus with diabetic chronic kidney disease: Secondary | ICD-10-CM

## 2019-07-04 DIAGNOSIS — F17210 Nicotine dependence, cigarettes, uncomplicated: Secondary | ICD-10-CM | POA: Diagnosis not present

## 2019-07-04 DIAGNOSIS — Z1211 Encounter for screening for malignant neoplasm of colon: Secondary | ICD-10-CM

## 2019-07-04 DIAGNOSIS — I10 Essential (primary) hypertension: Secondary | ICD-10-CM | POA: Diagnosis not present

## 2019-07-04 DIAGNOSIS — G43909 Migraine, unspecified, not intractable, without status migrainosus: Secondary | ICD-10-CM | POA: Diagnosis not present

## 2019-07-04 DIAGNOSIS — Z794 Long term (current) use of insulin: Secondary | ICD-10-CM

## 2019-07-04 LAB — BASIC METABOLIC PANEL
BUN: 22 mg/dL (ref 6–23)
CO2: 32 mEq/L (ref 19–32)
Calcium: 9.2 mg/dL (ref 8.4–10.5)
Chloride: 102 mEq/L (ref 96–112)
Creatinine, Ser: 1.53 mg/dL — ABNORMAL HIGH (ref 0.40–1.20)
GFR: 34.37 mL/min — ABNORMAL LOW (ref >60.00)
Glucose, Bld: 177 mg/dL — ABNORMAL HIGH (ref 70–99)
Potassium: 4.7 mEq/L (ref 3.5–5.1)
Sodium: 144 mEq/L (ref 135–145)

## 2019-07-04 MED ORDER — FLUOXETINE HCL 20 MG PO CAPS: 20.0000 mg | ORAL_CAPSULE | Freq: Every day | ORAL | 3 refills | Status: DC

## 2019-07-04 MED ORDER — TOUJEO SOLOSTAR 300 UNIT/ML ~~LOC~~ SOPN: 120.0000 [IU] | PEN_INJECTOR | Freq: Every day | SUBCUTANEOUS | 1 refills | Status: DC

## 2019-07-04 MED ORDER — TOPIRAMATE 25 MG PO TABS: 25.0000 mg | ORAL_TABLET | Freq: Two times a day (BID) | ORAL | 1 refills | Status: DC

## 2019-07-04 MED ORDER — NOVOLOG FLEXPEN 100 UNIT/ML ~~LOC~~ SOPN: 30.0000 [IU] | PEN_INJECTOR | Freq: Three times a day (TID) | SUBCUTANEOUS | 5 refills | Status: DC

## 2019-07-04 MED ORDER — VERAPAMIL HCL ER 300 MG PO CP24: 300.0000 mg | ORAL_CAPSULE | Freq: Every day | ORAL | 0 refills | Status: DC

## 2019-07-04 MED ORDER — ROSUVASTATIN CALCIUM 20 MG PO TABS: 20.0000 mg | ORAL_TABLET | Freq: Every day | ORAL | 2 refills | Status: DC

## 2019-07-04 NOTE — Patient Instructions (Signed)
Schedule f/u with Dr. Harrington Challenger  I would recommend that you quit smoking.   Continue current medications.  Updated list provided on here.    See me again in 3 months.

## 2019-07-04 NOTE — Progress Notes (Signed)
[name] - 63 y.o. female MRN [id number]  Date of birth: 28-May-1956  Subjective Chief Complaint  Patient presents with  . Follow-up    6 wk F/U HTN/COPD/Smoking     HPI   [name] is a 63 y.o. female with multiple medical problems here today for follow up of HTN, COPD with continued nicotine dependence, T2DM, and depression.    -HTN: Current treatment with verapamil, valsartan, and imdur.  Has history of SVT as well.  Denies recent symptoms of SVT.  She has not followed up with cardiology in several months.  She is not very compliant with diet or medications. She is out of verapamil and has not taken her other medications today.  She denies anginal symptoms, headache or vision changes.   -COPD:  Recent COPD exacerbation, resolved with steroids and inhalers.  She unfortunately continues to smoke.  She has rx for chantix at home but hasn't felt that she is ready to quit yet.    -T2DM:  Previous a1c of 8.2%. Poor compliance with diet and exercise. Refused referral to endocrinology. She is not currently monitoring glucose at home regularly.    -Depression with anxiety:  Has been out of fluoxetine.  Feels like she needs to be back on this.  She does still have buspar but has not been taking.    -Migraines:  Intermittent migraines, rx for topiramate but currently out of medication.   ROS:  A comprehensive ROS was completed and negative except as noted per HPI  Allergies  Allergen Reactions  . Atorvastatin Other (See Comments) and Nausea And Vomiting    "legs cramped; moderate to almost severe" "legs cramped; moderate to almost severe"  . Ibuprofen Other (See Comments)    Other reaction(s): Other (See Comments) Per pt, MD doesn't want pt to take Per pt, MD doesn't want pt to take  . Metformin And Related Nausea And Vomiting  . Ace Inhibitors Cough and Nausea And Vomiting  . Hydrocodone-Acetaminophen Nausea And Vomiting  . Albuterol Other (See Comments)    Caused SVT     Past Medical History:  Diagnosis Date  . Anxiety    doesn't take any meds for this  . Arteriosclerosis of coronary artery 08/31/2011   Myoview 9/12: EF 44, no ischemia // Myoview 8/18: EF 57, low risk, possible small area of inferior ischemia // Minor nonobstructive CAD by cardiac catheterization - LHC 8/18: Proximal LAD 35, OM2 25  . Arthritis    knuckles  . Asthma   . Carotid artery disease ([place])    Carotid US 7/14: Bilateral ICA 40-59 // Carotid US ([facility]) 2/17: bilat plaque without sig stenosis  . Chronic diastolic CHF (congestive heart failure) ([place]) 07/26/2017   Echo 7/18:Severe conc LVH, EF 60-65, no RWMA, Gr 2 DD, MAC, mild MR // right and left heart cath 8/18: LVEDP 21  . Claustrophobia 07/26/2012  . COPD (chronic obstructive pulmonary disease) ([place])    "chronic bronchitis - about every winter"  . CSF leak    dizziness/headache - assoc symptoms // 2/2 encephalocele s/p skull base reconstruction  . Diverticulosis   . History of CVA (cerebrovascular accident) 03/2004   denies residual 07/26/2012  . Hx of gout   . Hyperlipidemia    takes Pravastatin daily  . Hypertensive heart disease with chronic diastolic congestive heart failure ([employer]) 07/26/2017  . Internal hemorrhoids   . Kidney stones   . LVH (left ventricular hypertrophy)    Echo 8/18: Severe conc LVH, EF  60-65, no RWMA, Gr 2 DD, MAC, mild MR  . OSA (obstructive sleep apnea) 07/26/2012   prior CPAP - stopped due to being primary caregiver for dying parent (stopped in 2016)  . PSVT (paroxysmal supraventricular tachycardia) ([place])   . Pulmonary hypertension, unspecified ([place]) 08/14/2017   Right heart cath 8/13:RVSP 66, PASP 57, mean PA 39, mean PWCP 18, LVEDP 21  . Type II diabetes mellitus ([place])     Past Surgical History:  Procedure Laterality Date  . ESOPHAGOGASTRODUODENOSCOPY (EGD) WITH PROPOFOL N/A 07/13/2015   Procedure: ESOPHAGOGASTRODUODENOSCOPY (EGD) WITH PROPOFOL;  Surgeon: Carol Ada, MD;  Location: [facility];  Service: Endoscopy;  Laterality: N/A;  . NASAL SINUS SURGERY  01/29/2012   Procedure: ENDOSCOPIC SINUS SURGERY WITH STEALTH;  Surgeon: Ruby Cola, MD;  Location: [facility];  Service: ENT;  Laterality: N/A;  . NM MYOVIEW LTD  08/25/2011   Low risk study, no evidence of ischemia, EF 44%  . REFRACTIVE SURGERY  ~ 2010   left  . RIGHT/LEFT HEART CATH AND CORONARY ANGIOGRAPHY N/A 07/30/2017   Procedure: RIGHT/LEFT HEART CATH AND CORONARY ANGIOGRAPHY;  Surgeon: Martinique, Peter M, MD;  Location: [facility];  Service: Cardiovascular;  Laterality: N/A;  . SPHENOIDECTOMY  01/29/2012   Procedure: SPHENOIDECTOMY;  Surgeon: Ruby Cola, MD;  Location: [facility];  Service: ENT;  Laterality: Left;  REPAIR OF LEFT SPHENOID    . TOTAL ABDOMINAL HYSTERECTOMY  1994    Social History   Socioeconomic History  . Marital status: Single    Spouse name: Not on file  . Number of children: 0  . Years of education: Not on file  . Highest education level: Not on file  Occupational History  . Occupation: disabled  Social Needs  . Financial resource strain: Not on file  . Food insecurity    Worry: Not on file    Inability: Not on file  . Transportation needs    Medical: Not on file    Non-medical: Not on file  Tobacco Use  . Smoking status: Current Every Day Smoker    Packs/day: 0.50    Years: 35.00    Pack years: 17.50    Types: Cigarettes  . Smokeless tobacco: Never Used  . Tobacco comment: 07/26/2012 has decreased smoking from 2ppd; offered cessation materials; pt declines  Substance and Sexual Activity  . Alcohol use: Yes    Alcohol/week: 1.0 standard drinks    Types: 1 Glasses of wine per week    Comment: rarely  . Drug use: No  . Sexual activity: Never    Birth control/protection: Surgical  Lifestyle  . Physical activity    Days per week: Not on file    Minutes per session: Not on file  . Stress: Not on file  Relationships  . Social Herbalist on phone: Not on file     Gets together: Not on file    Attends religious service: Not on file    Active member of club or organization: Not on file    Attends meetings of clubs or organizations: Not on file    Relationship status: Not on file  Other Topics Concern  . Not on file  Social History Narrative   Currently unemployed.  Really wants to find a job, but has been difficult for her.  No children, not married    Family History  Problem Relation Age of Onset  . Colon cancer Sister 33  . Breast cancer Mother 72  .  Alzheimer's disease Mother   . Diabetes Sister 48  . Heart failure Sister   . Diabetes Sister 78  . Lung cancer Father   . Breast cancer Sister   . Cancer Sister        mouth  . Anesthesia problems Neg Hx   . Hypotension Neg Hx   . Malignant hyperthermia Neg Hx   . Pseudochol deficiency Neg Hx     Health Maintenance  Topic Date Due  . HIV Screening  04/26/1972  . PAP SMEAR-Modifier  04/26/1978  . OPHTHALMOLOGY EXAM  03/04/2014  . FOOT EXAM  06/20/2015  . MAMMOGRAM  06/25/2018  . INFLUENZA VACCINE  07/09/2019  . COLONOSCOPY  10/09/2019  . HEMOGLOBIN A1C  11/22/2019  . TETANUS/TDAP  03/29/2025  . PNEUMOCOCCAL POLYSACCHARIDE VACCINE AGE 51-64 HIGH RISK  Completed  . Hepatitis C Screening  Completed    ----------------------------------------------------------------------------------------------------------------------------------------------------------------------------------------------------------------- Physical Exam BP (!) 152/96   Pulse 88   Temp 97.8 F (36.6 C) (Oral)   Resp (!) 24   Ht 5' 2.5" (1.588 m)   Wt 249 lb 12.8 oz (113.3 kg)   SpO2 93%   BMI 44.96 kg/m   Physical Exam Constitutional:      Appearance: Normal appearance.  Eyes:     General: No scleral icterus. Cardiovascular:     Rate and Rhythm: Normal rate and regular rhythm.  Pulmonary:     Effort: Pulmonary effort is normal.     Breath sounds: Normal breath sounds.  Skin:    General: Skin is  warm and dry.  Neurological:     General: No focal deficit present.     Mental Status: She is alert.  Psychiatric:        Mood and Affect: Mood normal.     ------------------------------------------------------------------------------------------------------------------------------------------------------------------------------------------------------------------- Assessment and Plan  COPD exacerbation ([place]) -Resolved.  Continue current rescue inhaler.  Rx for laba/ics inhaler sent in previously but these have been too expensive for her.   -Discussed importance of quitting smoking, encouraged to set quit date and start chantix.   Essential hypertension -BP elevated today -Variable compliance with medication and non compliant with lifestyle change.  -Medications refilled.  Encouraged to take medication upon returning home.  -Update bmp as creatinine elevated recently.   Paroxysmal SVT (supraventricular tachycardia) ([facility]) -No recent episodes, encouraged to follow up with cardiology as well.   Type 2 diabetes mellitus with stage 3 chronic kidney disease, with long-term current use of insulin ([place]) -Poorly controlled diabetes.   -Discussed that she is going to have to make lifestyle changes to better control her blood sugars.   -Continue current insulin.    Moderate episode of recurrent major depressive disorder ([facility]) Restart fluoxetine Continue buspar.   Migraine syndrome Still with intermittent migraines, restart topiramate.

## 2019-07-05 ENCOUNTER — Encounter: Payer: Self-pay | Admitting: Family Medicine

## 2019-07-05 DIAGNOSIS — G43909 Migraine, unspecified, not intractable, without status migrainosus: Secondary | ICD-10-CM | POA: Insufficient documentation

## 2019-07-05 NOTE — Assessment & Plan Note (Signed)
-  BP elevated today -Variable compliance with medication and non compliant with lifestyle change.  -Medications refilled.  Encouraged to take medication upon returning home.  -Update bmp as creatinine elevated recently.

## 2019-07-05 NOTE — Assessment & Plan Note (Signed)
-  Poorly controlled diabetes.   -Discussed that she is going to have to make lifestyle changes to better control her blood sugars.   -Continue current insulin.

## 2019-07-05 NOTE — Assessment & Plan Note (Signed)
-  No recent episodes, encouraged to follow up with cardiology as well.

## 2019-07-05 NOTE — Assessment & Plan Note (Signed)
Still with intermittent migraines, restart topiramate.

## 2019-07-05 NOTE — Assessment & Plan Note (Signed)
Restart fluoxetine Continue buspar.

## 2019-07-05 NOTE — Assessment & Plan Note (Signed)
-  Resolved.  Continue current rescue inhaler.  Rx for laba/ics inhaler sent in previously but these have been too expensive for her.   -Discussed importance of quitting smoking, encouraged to set quit date and start chantix.

## 2019-07-08 ENCOUNTER — Other Ambulatory Visit: Payer: Self-pay | Admitting: Family Medicine

## 2019-07-17 ENCOUNTER — Other Ambulatory Visit: Payer: Self-pay | Admitting: Family Medicine

## 2019-07-30 ENCOUNTER — Other Ambulatory Visit: Payer: Self-pay | Admitting: Family Medicine

## 2019-07-30 DIAGNOSIS — I471 Supraventricular tachycardia: Secondary | ICD-10-CM

## 2019-08-16 ENCOUNTER — Other Ambulatory Visit: Payer: Self-pay

## 2019-08-16 ENCOUNTER — Emergency Department (HOSPITAL_COMMUNITY): Payer: Medicare Other

## 2019-08-16 ENCOUNTER — Inpatient Hospital Stay (HOSPITAL_COMMUNITY)
Admission: EM | Admit: 2019-08-16 | Discharge: 2019-08-26 | DRG: 291 | Disposition: A | Discharge status: Home Health | Payer: Medicare Other | Source: Ambulatory Visit | Attending: Internal Medicine | Admitting: Internal Medicine

## 2019-08-16 ENCOUNTER — Encounter: Payer: Self-pay | Admitting: Pulmonary Disease

## 2019-08-16 ENCOUNTER — Ambulatory Visit (INDEPENDENT_AMBULATORY_CARE_PROVIDER_SITE_OTHER): Payer: Medicare Other | Admitting: Pulmonary Disease

## 2019-08-16 VITALS — BP 148/100 | HR 95 | Temp 97.3°F | Ht 62.0 in | Wt 257.0 lb

## 2019-08-16 DIAGNOSIS — F419 Anxiety disorder, unspecified: Secondary | ICD-10-CM | POA: Diagnosis present

## 2019-08-16 DIAGNOSIS — I471 Supraventricular tachycardia: Secondary | ICD-10-CM | POA: Diagnosis present

## 2019-08-16 DIAGNOSIS — N179 Acute kidney failure, unspecified: Secondary | ICD-10-CM | POA: Diagnosis not present

## 2019-08-16 DIAGNOSIS — I340 Nonrheumatic mitral (valve) insufficiency: Secondary | ICD-10-CM | POA: Diagnosis not present

## 2019-08-16 DIAGNOSIS — M109 Gout, unspecified: Secondary | ICD-10-CM | POA: Diagnosis present

## 2019-08-16 DIAGNOSIS — E860 Dehydration: Secondary | ICD-10-CM | POA: Diagnosis not present

## 2019-08-16 DIAGNOSIS — Z794 Long term (current) use of insulin: Secondary | ICD-10-CM | POA: Diagnosis not present

## 2019-08-16 DIAGNOSIS — I2583 Coronary atherosclerosis due to lipid rich plaque: Secondary | ICD-10-CM | POA: Diagnosis not present

## 2019-08-16 DIAGNOSIS — Z885 Allergy status to narcotic agent status: Secondary | ICD-10-CM

## 2019-08-16 DIAGNOSIS — Z820 Family history of epilepsy and other diseases of the nervous system: Secondary | ICD-10-CM

## 2019-08-16 DIAGNOSIS — I272 Pulmonary hypertension, unspecified: Secondary | ICD-10-CM | POA: Diagnosis present

## 2019-08-16 DIAGNOSIS — I5043 Acute on chronic combined systolic (congestive) and diastolic (congestive) heart failure: Secondary | ICD-10-CM | POA: Diagnosis present

## 2019-08-16 DIAGNOSIS — R079 Chest pain, unspecified: Secondary | ICD-10-CM | POA: Diagnosis not present

## 2019-08-16 DIAGNOSIS — R600 Localized edema: Secondary | ICD-10-CM

## 2019-08-16 DIAGNOSIS — F17210 Nicotine dependence, cigarettes, uncomplicated: Secondary | ICD-10-CM | POA: Diagnosis present

## 2019-08-16 DIAGNOSIS — N183 Chronic kidney disease, stage 3 (moderate): Secondary | ICD-10-CM | POA: Diagnosis not present

## 2019-08-16 DIAGNOSIS — J449 Chronic obstructive pulmonary disease, unspecified: Secondary | ICD-10-CM | POA: Diagnosis present

## 2019-08-16 DIAGNOSIS — Z79899 Other long term (current) drug therapy: Secondary | ICD-10-CM

## 2019-08-16 DIAGNOSIS — I160 Hypertensive urgency: Secondary | ICD-10-CM | POA: Diagnosis present

## 2019-08-16 DIAGNOSIS — N390 Urinary tract infection, site not specified: Secondary | ICD-10-CM | POA: Diagnosis present

## 2019-08-16 DIAGNOSIS — J9621 Acute and chronic respiratory failure with hypoxia: Secondary | ICD-10-CM | POA: Diagnosis not present

## 2019-08-16 DIAGNOSIS — Z90710 Acquired absence of both cervix and uterus: Secondary | ICD-10-CM

## 2019-08-16 DIAGNOSIS — Z87728 Personal history of other specified (corrected) congenital malformations of nervous system and sense organs: Secondary | ICD-10-CM

## 2019-08-16 DIAGNOSIS — E785 Hyperlipidemia, unspecified: Secondary | ICD-10-CM | POA: Diagnosis present

## 2019-08-16 DIAGNOSIS — I509 Heart failure, unspecified: Secondary | ICD-10-CM | POA: Diagnosis not present

## 2019-08-16 DIAGNOSIS — I5082 Biventricular heart failure: Secondary | ICD-10-CM | POA: Diagnosis not present

## 2019-08-16 DIAGNOSIS — Z7982 Long term (current) use of aspirin: Secondary | ICD-10-CM

## 2019-08-16 DIAGNOSIS — I110 Hypertensive heart disease with heart failure: Secondary | ICD-10-CM | POA: Diagnosis not present

## 2019-08-16 DIAGNOSIS — R0602 Shortness of breath: Secondary | ICD-10-CM | POA: Diagnosis not present

## 2019-08-16 DIAGNOSIS — Z720 Tobacco use: Secondary | ICD-10-CM | POA: Diagnosis not present

## 2019-08-16 DIAGNOSIS — R778 Other specified abnormalities of plasma proteins: Secondary | ICD-10-CM

## 2019-08-16 DIAGNOSIS — G4733 Obstructive sleep apnea (adult) (pediatric): Secondary | ICD-10-CM | POA: Diagnosis present

## 2019-08-16 DIAGNOSIS — F329 Major depressive disorder, single episode, unspecified: Secondary | ICD-10-CM | POA: Diagnosis present

## 2019-08-16 DIAGNOSIS — Z20828 Contact with and (suspected) exposure to other viral communicable diseases: Secondary | ICD-10-CM | POA: Diagnosis present

## 2019-08-16 DIAGNOSIS — R570 Cardiogenic shock: Secondary | ICD-10-CM | POA: Diagnosis not present

## 2019-08-16 DIAGNOSIS — I130 Hypertensive heart and chronic kidney disease with heart failure and stage 1 through stage 4 chronic kidney disease, or unspecified chronic kidney disease: Principal | ICD-10-CM | POA: Diagnosis present

## 2019-08-16 DIAGNOSIS — E782 Mixed hyperlipidemia: Secondary | ICD-10-CM | POA: Diagnosis present

## 2019-08-16 DIAGNOSIS — T502X5A Adverse effect of carbonic-anhydrase inhibitors, benzothiadiazides and other diuretics, initial encounter: Secondary | ICD-10-CM | POA: Diagnosis not present

## 2019-08-16 DIAGNOSIS — I472 Ventricular tachycardia: Secondary | ICD-10-CM | POA: Diagnosis not present

## 2019-08-16 DIAGNOSIS — E11649 Type 2 diabetes mellitus with hypoglycemia without coma: Secondary | ICD-10-CM | POA: Diagnosis not present

## 2019-08-16 DIAGNOSIS — Z888 Allergy status to other drugs, medicaments and biological substances status: Secondary | ICD-10-CM

## 2019-08-16 DIAGNOSIS — Z801 Family history of malignant neoplasm of trachea, bronchus and lung: Secondary | ICD-10-CM

## 2019-08-16 DIAGNOSIS — Z6841 Body Mass Index (BMI) 40.0 and over, adult: Secondary | ICD-10-CM | POA: Diagnosis not present

## 2019-08-16 DIAGNOSIS — Z87442 Personal history of urinary calculi: Secondary | ICD-10-CM

## 2019-08-16 DIAGNOSIS — G4730 Sleep apnea, unspecified: Secondary | ICD-10-CM | POA: Diagnosis not present

## 2019-08-16 DIAGNOSIS — M1990 Unspecified osteoarthritis, unspecified site: Secondary | ICD-10-CM | POA: Diagnosis present

## 2019-08-16 DIAGNOSIS — I5033 Acute on chronic diastolic (congestive) heart failure: Secondary | ICD-10-CM | POA: Diagnosis not present

## 2019-08-16 DIAGNOSIS — J90 Pleural effusion, not elsewhere classified: Secondary | ICD-10-CM | POA: Diagnosis not present

## 2019-08-16 DIAGNOSIS — I5031 Acute diastolic (congestive) heart failure: Secondary | ICD-10-CM | POA: Diagnosis not present

## 2019-08-16 DIAGNOSIS — R609 Edema, unspecified: Secondary | ICD-10-CM | POA: Diagnosis not present

## 2019-08-16 DIAGNOSIS — Z833 Family history of diabetes mellitus: Secondary | ICD-10-CM

## 2019-08-16 DIAGNOSIS — R7989 Other specified abnormal findings of blood chemistry: Secondary | ICD-10-CM | POA: Diagnosis not present

## 2019-08-16 DIAGNOSIS — I361 Nonrheumatic tricuspid (valve) insufficiency: Secondary | ICD-10-CM | POA: Diagnosis not present

## 2019-08-16 DIAGNOSIS — E1122 Type 2 diabetes mellitus with diabetic chronic kidney disease: Secondary | ICD-10-CM | POA: Diagnosis present

## 2019-08-16 DIAGNOSIS — Z800 Family history of malignant neoplasm of digestive organs: Secondary | ICD-10-CM

## 2019-08-16 DIAGNOSIS — I2510 Atherosclerotic heart disease of native coronary artery without angina pectoris: Secondary | ICD-10-CM | POA: Diagnosis not present

## 2019-08-16 DIAGNOSIS — Z452 Encounter for adjustment and management of vascular access device: Secondary | ICD-10-CM

## 2019-08-16 DIAGNOSIS — I2722 Pulmonary hypertension due to left heart disease: Secondary | ICD-10-CM | POA: Diagnosis present

## 2019-08-16 DIAGNOSIS — I10 Essential (primary) hypertension: Secondary | ICD-10-CM | POA: Diagnosis present

## 2019-08-16 DIAGNOSIS — R0902 Hypoxemia: Secondary | ICD-10-CM | POA: Diagnosis not present

## 2019-08-16 DIAGNOSIS — N189 Chronic kidney disease, unspecified: Secondary | ICD-10-CM | POA: Diagnosis not present

## 2019-08-16 DIAGNOSIS — Z8673 Personal history of transient ischemic attack (TIA), and cerebral infarction without residual deficits: Secondary | ICD-10-CM

## 2019-08-16 DIAGNOSIS — T50916A Underdosing of multiple unspecified drugs, medicaments and biological substances, initial encounter: Secondary | ICD-10-CM | POA: Diagnosis present

## 2019-08-16 DIAGNOSIS — I2781 Cor pulmonale (chronic): Secondary | ICD-10-CM | POA: Diagnosis present

## 2019-08-16 DIAGNOSIS — R42 Dizziness and giddiness: Secondary | ICD-10-CM | POA: Diagnosis not present

## 2019-08-16 DIAGNOSIS — E669 Obesity, unspecified: Secondary | ICD-10-CM | POA: Diagnosis not present

## 2019-08-16 DIAGNOSIS — Z803 Family history of malignant neoplasm of breast: Secondary | ICD-10-CM

## 2019-08-16 DIAGNOSIS — Z8249 Family history of ischemic heart disease and other diseases of the circulatory system: Secondary | ICD-10-CM

## 2019-08-16 DIAGNOSIS — Z91138 Patient's unintentional underdosing of medication regimen for other reason: Secondary | ICD-10-CM

## 2019-08-16 LAB — BASIC METABOLIC PANEL
Anion gap: 11 (ref 5–15)
BUN: 18 mg/dL (ref 8–23)
CO2: 30 mmol/L (ref 22–32)
Calcium: 9.3 mg/dL (ref 8.9–10.3)
Chloride: 103 mmol/L (ref 98–111)
Creatinine, Ser: 1.72 mg/dL — ABNORMAL HIGH (ref 0.44–1.00)
GFR calc Af Amer: 36 mL/min — ABNORMAL LOW (ref >60)
GFR calc non Af Amer: 31 mL/min — ABNORMAL LOW (ref >60)
Glucose, Bld: 182 mg/dL — ABNORMAL HIGH (ref 70–99)
Potassium: 4 mmol/L (ref 3.5–5.1)
Sodium: 144 mmol/L (ref 135–145)

## 2019-08-16 LAB — CBC
HCT: 53.2 % — ABNORMAL HIGH (ref 36.0–46.0)
Hemoglobin: 16.4 g/dL — ABNORMAL HIGH (ref 12.0–15.0)
MCH: 29 pg (ref 26.0–34.0)
MCHC: 30.8 g/dL (ref 30.0–36.0)
MCV: 94.2 fL (ref 80.0–100.0)
Platelets: 184 10*3/uL (ref 150–400)
RBC: 5.65 MIL/uL — ABNORMAL HIGH (ref 3.87–5.11)
RDW: 16.7 % — ABNORMAL HIGH (ref 11.5–15.5)
WBC: 7.4 10*3/uL (ref 4.0–10.5)
nRBC: 0 % (ref 0.0–0.2)

## 2019-08-16 LAB — TROPONIN I (HIGH SENSITIVITY)
Troponin I (High Sensitivity): 23 ng/L — ABNORMAL HIGH (ref <18)
Troponin I (High Sensitivity): 29 ng/L — ABNORMAL HIGH (ref <18)

## 2019-08-16 MED ORDER — SODIUM CHLORIDE 0.9% FLUSH: 3.0000 mL | Freq: Once | INTRAVENOUS | Status: DC

## 2019-08-16 MED ORDER — NITROGLYCERIN 2 % TD OINT
1.0000 [in_us] | TOPICAL_OINTMENT | Freq: Once | TRANSDERMAL | Status: AC
  Administered 2019-08-17: 1 [in_us] via TOPICAL
  Filled 2019-08-16: qty 1

## 2019-08-16 MED ORDER — FUROSEMIDE 10 MG/ML IJ SOLN
40.0000 mg | Freq: Once | INTRAMUSCULAR | Status: AC
  Administered 2019-08-17: 40 mg via INTRAVENOUS
  Filled 2019-08-16: qty 4

## 2019-08-16 MED ORDER — IPRATROPIUM BROMIDE HFA 17 MCG/ACT IN AERS
2.0000 | INHALATION_SPRAY | Freq: Once | RESPIRATORY_TRACT | Status: AC
  Administered 2019-08-17: 2 via RESPIRATORY_TRACT
  Filled 2019-08-16: qty 12.9

## 2019-08-16 NOTE — ED Triage Notes (Signed)
Pt reports she went to pulmonologist due to increased SOB. Was sent here for admission. Recently stopped her fluid pill due to creatinine level. SOB and CP started after stopped fluid pill.

## 2019-08-16 NOTE — ED Provider Notes (Signed)
[facility] EMERGENCY DEPARTMENT Provider Note   CSN: [id number] Arrival date & time: 08/16/19  1727     History   Chief Complaint Chief Complaint  Patient presents with  . Shortness of Breath  . Chest Pain    HPI [name] is a 63 y.o. female. Patient is a history of hypertension, hyperlipidemia, and CHF presenting with shortness of breath that began 3 weeks ago along with swelling in her legs and abdomen.  Patient states that the symptoms began after she was discontinued from her Lasix due to an elevated creatinine.       HPI  Past Medical History:  Diagnosis Date  . Anxiety    doesn't take any meds for this  . Arteriosclerosis of coronary artery 08/31/2011   Myoview 9/12: EF 44, no ischemia // Myoview 8/18: EF 57, low risk, possible small area of inferior ischemia // Minor nonobstructive CAD by cardiac catheterization - LHC 8/18: Proximal LAD 35, OM2 25  . Arthritis    knuckles  . Asthma   . Carotid artery disease ([place])    Carotid US 7/14: Bilateral ICA 40-59 // Carotid US ([facility]) 2/17: bilat plaque without sig stenosis  . Chronic diastolic CHF (congestive heart failure) ([place]) 07/26/2017   Echo 7/18:Severe conc LVH, EF 60-65, no RWMA, Gr 2 DD, MAC, mild MR // right and left heart cath 8/18: LVEDP 21  . Claustrophobia 07/26/2012  . COPD (chronic obstructive pulmonary disease) ([place])    "chronic bronchitis - about every winter"  . CSF leak    dizziness/headache - assoc symptoms // 2/2 encephalocele s/p skull base reconstruction  . Diverticulosis   . History of CVA (cerebrovascular accident) 03/2004   denies residual 07/26/2012  . Hx of gout   . Hyperlipidemia    takes Pravastatin daily  . Hypertensive heart disease with chronic diastolic congestive heart failure ([place]) 07/26/2017  . Internal hemorrhoids   . Kidney stones   . LVH (left ventricular hypertrophy)    Echo 8/18: Severe conc LVH, EF 60-65, no RWMA, Gr 2 DD, MAC, mild MR  . OSA  (obstructive sleep apnea) 07/26/2012   prior CPAP - stopped due to being primary caregiver for dying parent (stopped in 2016)  . PSVT (paroxysmal supraventricular tachycardia) ([place])   . Pulmonary hypertension, unspecified ([place]) 08/14/2017   Right heart cath 8/13:RVSP 66, PASP 57, mean PA 39, mean PWCP 18, LVEDP 21  . Type II diabetes mellitus [facility])     Patient Active Problem List   Diagnosis Date Noted  . Migraine syndrome 07/05/2019  . Edema 05/23/2019  . COPD exacerbation ([place]) 01/05/2019  . Type 2 diabetes mellitus with stage 3 chronic kidney disease, with long-term current use of insulin ([place]) 11/18/2018  . Screening for breast cancer 06/30/2018  . Nicotine dependence, cigarettes, uncomplicated 82/50/5397  . Pulmonary hypertension, unspecified ([place]) 08/14/2017  . Hypertensive heart disease with chronic diastolic congestive heart failure ([place]) 07/26/2017  . Paroxysmal SVT (supraventricular tachycardia) ([place]) 07/26/2017  . COPD  GOLD 0 still smoking 07/13/2017  . Moderate episode of recurrent major depressive disorder ([place]) 07/12/2015  . OSA (obstructive sleep apnea) 10/17/2014  . Pain in joint, multiple sites 09/21/2013  . Carotid artery disease ([place]) 06/03/2013  . Dizziness 02/16/2013  . Depression 12/12/2012  . Uric acid kidney stone 07/04/2012  . CSF leak from nose 10/19/2011  . Arteriosclerosis of coronary artery 08/31/2011  . Type 2 diabetes mellitus with diabetic polyneuropathy, with long-term current use of insulin ([place])  08/31/2011  . HLD (hyperlipidemia) 07/02/2011  . Essential hypertension 06/20/2011  . Morbid obesity due to excess calories ([place]) 06/20/2011    Past Surgical History:  Procedure Laterality Date  . ESOPHAGOGASTRODUODENOSCOPY (EGD) WITH PROPOFOL N/A 07/13/2015   Procedure: ESOPHAGOGASTRODUODENOSCOPY (EGD) WITH PROPOFOL;  Surgeon: Carol Ada, MD;  Location: [facility];  Service: Endoscopy;  Laterality: N/A;  . NASAL SINUS SURGERY  01/29/2012   Procedure:  ENDOSCOPIC SINUS SURGERY WITH STEALTH;  Surgeon: Ruby Cola, MD;  Location: [facility];  Service: ENT;  Laterality: N/A;  . NM MYOVIEW LTD  08/25/2011   Low risk study, no evidence of ischemia, EF 44%  . REFRACTIVE SURGERY  ~ 2010   left  . RIGHT/LEFT HEART CATH AND CORONARY ANGIOGRAPHY N/A 07/30/2017   Procedure: RIGHT/LEFT HEART CATH AND CORONARY ANGIOGRAPHY;  Surgeon: Martinique, Peter M, MD;  Location: [place] CV LAB;  Service: Cardiovascular;  Laterality: N/A;  . SPHENOIDECTOMY  01/29/2012   Procedure: SPHENOIDECTOMY;  Surgeon: Ruby Cola, MD;  Location: [facility];  Service: ENT;  Laterality: Left;  REPAIR OF LEFT SPHENOID    . TOTAL ABDOMINAL HYSTERECTOMY  1994     OB History    Gravida  0   Para      Term      Preterm      AB      Living        SAB      TAB      Ectopic      Multiple      Live Births               Home Medications    Prior to Admission medications   Medication Sig Start Date End Date Taking? Authorizing Provider  acetaminophen (TYLENOL) 325 MG tablet Take 2 tablets (650 mg total) by mouth every 6 (six) hours as needed for mild pain. 06/30/18   Luetta Nutting, DO  albuterol (VENTOLIN HFA) 108 (90 Base) MCG/ACT inhaler Inhale 2 puffs into the lungs every 6 (six) hours as needed for wheezing or shortness of breath. 04/21/19   Luetta Nutting, DO  allopurinol (ZYLOPRIM) 100 MG tablet  06/24/18   [provider]  aspirin EC 81 MG tablet Take 1 tablet (81 mg total) by mouth daily. 07/13/17   Richardson Dopp T, PA-C  Blood Glucose Monitoring Suppl (ACCU-CHEK AVIVA PLUS) w/Device KIT Patient is to test four times daily. Dx. E11.42 11/03/18   Luetta Nutting, DO  busPIRone (BUSPAR) 15 MG tablet Take 15 mg by mouth 2 (two) times daily.  06/17/16   [provider]  FLUoxetine (PROZAC) 20 MG capsule TAKE 1 CAPSULE (20 MG TOTAL) BY MOUTH AT BEDTIME. 07/31/19 08/30/19  Luetta Nutting, DO  gabapentin (NEURONTIN) 400 MG capsule TAKE 1 CAPSULE BY MOUTH  THREE TIMES A DAY 04/25/19   Luetta Nutting, DO  glucose blood [facility] VERIO) test strip For E11.22 07/11/19   Luetta Nutting, DO  insulin aspart (NOVOLOG FLEXPEN) 100 UNIT/ML FlexPen Inject 30-40 Units into the skin 3 (three) times daily with meals. 07/04/19   Luetta Nutting, DO  Insulin Glargine (TOUJEO SOLOSTAR) 300 UNIT/ML SOPN Inject 110 Units into the skin 2 (two) times daily. Patient taking differently: Inject 120 Units into the skin 2 (two) times daily.  03/31/18   Luetta Nutting, DO  Insulin Glargine, 1 Unit Dial, (TOUJEO SOLOSTAR) 300 UNIT/ML SOPN Inject 120 Units into the skin daily. 07/04/19 10/02/19  Luetta Nutting, DO  isosorbide mononitrate (IMDUR) 60 MG 24 hr  tablet TAKE 1 TABLET (60 MG TOTAL) BY MOUTH DAILY FOR 30 DAYS. ** DO NOT CRUSH ** 07/18/19 08/17/19  Luetta Nutting, DO  nitroGLYCERIN (NITROSTAT) 0.4 MG SL tablet Place 1 tablet (0.4 mg total) under the tongue every 5 (five) minutes as needed. 07/27/17   Richardson Dopp T, PA-C  omeprazole (PRILOSEC) 40 MG capsule Take 30- 60 min before your first and last meals of the day 01/13/18   Tanda Rockers, MD  potassium chloride SA (KLOR-CON M20) 20 MEQ tablet Take 1 tablet (20 mEq total) by mouth daily. Please make overdue appt with Dr. Harrington Challenger before anymore refills. 1st attempt 10/12/18   Richardson Dopp T, PA-C  rosuvastatin (CRESTOR) 20 MG tablet Take 1 tablet (20 mg total) by mouth daily. 07/04/19 10/02/19  Luetta Nutting, DO  topiramate (TOPAMAX) 25 MG tablet Take 1 tablet (25 mg total) by mouth 2 (two) times daily. 07/04/19   Luetta Nutting, DO  valsartan (DIOVAN) 40 MG tablet  02/23/19   [provider]  varenicline (CHANTIX STARTING MONTH PAK) 0.5 MG X 11 & 1 MG X 42 tablet Take one 0.5 mg tab po once daily for 3 days, then increase to one 0.5 mg tab bid for 4 days, then increase to one 1 mg tab bid. 05/19/19   Luetta Nutting, DO  Verapamil HCl CR 300 MG CP24 TAKE 1 CAPSULE (300 MG TOTAL) BY MOUTH DAILY. PLEASE CALL TO SCHEDULE OVERDUE  APPT WITH DR ROSS 08/04/19   Luetta Nutting, DO    Family History Family History  Problem Relation Age of Onset  . Colon cancer Sister 68  . Breast cancer Mother 34  . Alzheimer's disease Mother   . Diabetes Sister 61  . Heart failure Sister   . Diabetes Sister 62  . Lung cancer Father   . Breast cancer Sister   . Cancer Sister        mouth  . Anesthesia problems Neg Hx   . Hypotension Neg Hx   . Malignant hyperthermia Neg Hx   . Pseudochol deficiency Neg Hx     Social History Social History   Tobacco Use  . Smoking status: Current Every Day Smoker    Packs/day: 0.50    Years: 35.00    Pack years: 17.50    Types: Cigarettes  . Smokeless tobacco: Never Used  . Tobacco comment: 07/26/2012 has decreased smoking from 2ppd; offered cessation materials; pt declines  Substance Use Topics  . Alcohol use: Yes    Alcohol/week: 1.0 standard drinks    Types: 1 Glasses of wine per week    Comment: rarely  . Drug use: No     Allergies   Atorvastatin, Ibuprofen, Metformin and related, Ace inhibitors, Hydrocodone-acetaminophen, and Albuterol   Review of Systems Review of Systems  Constitutional: Negative for chills and fever.  HENT: Negative for congestion and sinus pressure.   Eyes: Negative for pain.  Respiratory: Positive for shortness of breath.   Cardiovascular: Positive for leg swelling. Negative for chest pain.  Gastrointestinal: Positive for abdominal distention.  Genitourinary: Negative for dysuria.  Musculoskeletal: Negative for myalgias.  Skin: Negative for wound.  Neurological: Positive for weakness. Negative for syncope.     Physical Exam Updated Vital Signs BP (!) 194/102   Pulse 81   Temp 98.6 F (37 C) (Oral)   Resp 18   Ht _0  (1.575 m)   Wt 116 kg   SpO2 93%   BMI 46.77 kg/m   Physical Exam Vitals  signs and nursing note reviewed.  Constitutional:      General: She is not in acute distress. HENT:     Head: Normocephalic and atraumatic.      Nose: Nose normal.  Eyes:     General: No scleral icterus. Neck:     Musculoskeletal: Normal range of motion.  Cardiovascular:     Rate and Rhythm: Normal rate and regular rhythm.     Pulses: Normal pulses.     Heart sounds: No friction rub. No gallop.   Pulmonary:     Effort: Pulmonary effort is normal. No accessory muscle usage or respiratory distress.     Breath sounds: Examination of the left-middle field reveals decreased breath sounds. Examination of the right-lower field reveals decreased breath sounds. Examination of the left-lower field reveals decreased breath sounds. Decreased breath sounds present. No wheezing, rhonchi or rales.  Chest:     Chest wall: No tenderness.  Abdominal:     General: Abdomen is flat. Bowel sounds are normal. There is no distension.     Palpations: Abdomen is soft.     Tenderness: There is no abdominal tenderness. There is no guarding.  Musculoskeletal:     Right lower leg: Edema (2+ to the level of the knee) present.     Left lower leg: Edema (2+ to the level of the knee) present.  Skin:    General: Skin is warm and dry.     Capillary Refill: Capillary refill takes less than 2 seconds.  Neurological:     Mental Status: She is alert. Mental status is at baseline.  Psychiatric:        Mood and Affect: Mood normal.        Behavior: Behavior normal.      ED Treatments / Results  Labs (all labs ordered are listed, but only abnormal results are displayed) Labs Reviewed  BASIC METABOLIC PANEL - Abnormal; Notable for the following components:      Result Value   Glucose, Bld 182 (*)    Creatinine, Ser 1.72 (*)    GFR calc non Af Amer 31 (*)    GFR calc Af Amer 36 (*)    All other components within normal limits  CBC - Abnormal; Notable for the following components:   RBC 5.65 (*)    Hemoglobin 16.4 (*)    HCT 53.2 (*)    RDW 16.7 (*)    All other components within normal limits  TROPONIN I (HIGH SENSITIVITY) - Abnormal; Notable for  the following components:   Troponin I (High Sensitivity) 23 (*)    All other components within normal limits  BRAIN NATRIURETIC PEPTIDE  TROPONIN I (HIGH SENSITIVITY)    EKG EKG Interpretation  Date/Time:  Tuesday August 16 2019 17:32:09 EDT Ventricular Rate:  95 PR Interval:  174 QRS Duration: 78 QT Interval:  396 QTC Calculation: 497 R Axis:   174 Text Interpretation:  Normal sinus rhythm Low voltage QRS Septal infarct , age undetermined Lateral infarct , age undetermined Abnormal ECG Right axis deviation When compared with ECG of 12/29/2018, No significant change was found Confirmed by Delora Fuel ([id number]) on 08/16/2019 11:13:00 PM   Radiology Dg Chest 2 View  Result Date: 08/16/2019 CLINICAL DATA:  63 year old female with chest pain and shortness of breath. EXAM: CHEST - 2 VIEW COMPARISON:  Chest radiograph dated 01/05/2019 FINDINGS: Small left pleural effusion and left lung base atelectasis versus infiltrate. Overall slight interval increase in the size of the left pleural effusion  and associated left lung base opacity compared to the prior radiograph. The right lung remains clear. No pneumothorax. Stable cardiac silhouette. No acute osseous pathology. Degenerative changes of the spine. IMPRESSION: Small left pleural effusion and left lung base atelectasis versus infiltrate, increased in size since the prior radiograph. Electronically Signed   By: Anner Crete M.D.   On: 08/16/2019 19:01    Procedures Procedures (including critical care time)  Medications Ordered in ED Medications  sodium chloride flush (NS) 0.9 % injection 3 mL (has no administration in time range)     Initial Impression / Assessment and Plan / ED Course  I have reviewed the triage vital signs and the nursing notes.  Pertinent labs & imaging results that were available during my care of the patient were reviewed by me and considered in my medical decision making (see chart for details).  Clinical  Course as of Aug 15 2336  Tue Aug 16, 2019  2335 Creatinine(!): 1.72 [WF]    Clinical Course User Index [WF] Tedd Sias, [place]     This patient presents with shortness of breath that is progressive for the past 3 weeks.  Patient has stopped taking Lasix per instructions from pulmonologist due to AKI.  Pulmonology informed patient that she would likely require hospitalization and IV diuresis.   Patient's creatinine today is 1.72 similar to her past 2 creatinine levels in the last month.  Patient states she has decreased urine output over the past few weeks as well.  Blood pressure is elevated at 194/102 patient denies any chest pain currently but will be given nitro paste to decrease preload and blood pressure.  Initial troponin is elevated at 23 most likely due to fluid overload status.  Second troponin will be monitored to assess for upward trend.  BNP pending at this time.  Chest x-ray consistent with CHF exacerbation.   EKG no significant change from prior study.  Patient's last heart cath conducted 07/30/2017 showed nonobstructive CAD, moderate pulmonary hypertension and mildly elevated LVEDP.   Patient given home dose of Lasix 40 mg.  Nitropaste, oxygen dosage for COVID Prior to admission.      Final Clinical Impressions(s) / ED Diagnoses   Final diagnoses:  None    ED Discharge Orders    None       Tedd Sias, [place] [birth year] 5686    Delora Fuel, MD 16/83/72 [phone]

## 2019-08-16 NOTE — Progress Notes (Signed)
Synopsis: Referred in September 2020 for progressive shortness of breath by Luetta Nutting, DO  Subjective:   PATIENT ID: [name] GENDER: female DOB: 07/29/1956, MRN: [id number]  Chief Complaint  Patient presents with  . Consult    Fomer Wert patient, requested to switch providers. She reports retaining more fluid and increased SOB.     63 year old female past medical history of atherosclerotic coronary artery disease, COPD, chronic bronchitis, LVH, pulmonary hypertension, RVSP 66, PASP 57, pulmonary capillary wedge 18, type 2 diabetes, obstructive sleep apnea on CPAP.  Patient complains of progressive fluid retention and shortness of breath for the past several weeks.  Recently diuretics were held due to increasing serum creatinine.  Unfortunately she has continued to retain fluid.  At this point has gained approximately 12 to 14 pounds.  She states her appetite is very poor.  She has early satiety and abdominal bloating.  Significant lower extremity edema.  She has been monitoring her O2 sats at home.  She states that her sats at home have dropped as low as 81% with ambulation.  Today in the office she is walked and her O2 saturations have dropped to 84%..  The Bauch family is known well to me.  I have had the pleasure of taking care of several of the Harnisch family members.  Her Sister Baker Janus was also on the phone today via the office visit.  She is very concerned about the decline that her Sister Shauna Hugh has had over the past week.  Unfortunately, Diane is still smoking.  She is down to about a half a pack a day.  At her max she is usually smoking 1 pack/day.  But she has been feeling too bad recently.  She is trying to cut down and she knows that she needs to quit.  At this time currently only managing COPD with PRN albuterol use.   Past Medical History:  Diagnosis Date  . Anxiety    doesn't take any meds for this  . Arteriosclerosis of coronary artery 08/31/2011   Myoview  9/12: EF 44, no ischemia // Myoview 8/18: EF 57, low risk, possible small area of inferior ischemia // Minor nonobstructive CAD by cardiac catheterization - [facility] 8/18: Proximal LAD 35, OM2 25  . Arthritis    knuckles  . Asthma   . Carotid artery disease ([place])    Carotid US 7/14: Bilateral ICA 40-59 // Carotid US ([facility]) 2/17: bilat plaque without sig stenosis  . Chronic diastolic CHF (congestive heart failure) ([place]) 07/26/2017   Echo 7/18:Severe conc LVH, EF 60-65, no RWMA, Gr 2 DD, MAC, mild MR // right and left heart cath 8/18: LVEDP 21  . Claustrophobia 07/26/2012  . COPD (chronic obstructive pulmonary disease) ([place])    "chronic bronchitis - about every winter"  . CSF leak    dizziness/headache - assoc symptoms // 2/2 encephalocele s/p skull base reconstruction  . Diverticulosis   . History of CVA (cerebrovascular accident) 03/2004   denies residual 07/26/2012  . Hx of gout   . Hyperlipidemia    takes Pravastatin daily  . Hypertensive heart disease with chronic diastolic congestive heart failure ([place]) 07/26/2017  . Internal hemorrhoids   . Kidney stones   . LVH (left ventricular hypertrophy)    Echo 8/18: Severe conc LVH, EF 60-65, no RWMA, Gr 2 DD, MAC, mild MR  . OSA (obstructive sleep apnea) 07/26/2012   prior CPAP - stopped due to being primary caregiver for dying parent (stopped in 2016)  .  PSVT (paroxysmal supraventricular tachycardia) ([place])   . Pulmonary hypertension, unspecified ([place]) 08/14/2017   Right heart cath 8/13:RVSP 66, PASP 57, mean PA 39, mean PWCP 18, LVEDP 21  . Type II diabetes mellitus ([facility])      Family History  Problem Relation Age of Onset  . Colon cancer Sister 28  . Breast cancer Mother 19  . Alzheimer's disease Mother   . Diabetes Sister 44  . Heart failure Sister   . Diabetes Sister 41  . Lung cancer Father   . Breast cancer Sister   . Cancer Sister        mouth  . Anesthesia problems Neg Hx   . Hypotension Neg Hx   . Malignant hyperthermia Neg  Hx   . Pseudochol deficiency Neg Hx      Past Surgical History:  Procedure Laterality Date  . ESOPHAGOGASTRODUODENOSCOPY (EGD) WITH PROPOFOL N/A 07/13/2015   Procedure: ESOPHAGOGASTRODUODENOSCOPY (EGD) WITH PROPOFOL;  Surgeon: Carol Ada, MD;  Location: [facility];  Service: Endoscopy;  Laterality: N/A;  . NASAL SINUS SURGERY  01/29/2012   Procedure: ENDOSCOPIC SINUS SURGERY WITH STEALTH;  Surgeon: Ruby Cola, MD;  Location: [facility];  Service: ENT;  Laterality: N/A;  . NM MYOVIEW LTD  08/25/2011   Low risk study, no evidence of ischemia, EF 44%  . REFRACTIVE SURGERY  ~ 2010   left  . RIGHT/LEFT HEART CATH AND CORONARY ANGIOGRAPHY N/A 07/30/2017   Procedure: RIGHT/LEFT HEART CATH AND CORONARY ANGIOGRAPHY;  Surgeon: Martinique, Peter M, MD;  Location: [facility];  Service: Cardiovascular;  Laterality: N/A;  . SPHENOIDECTOMY  01/29/2012   Procedure: SPHENOIDECTOMY;  Surgeon: Ruby Cola, MD;  Location: [facility];  Service: ENT;  Laterality: Left;  REPAIR OF LEFT SPHENOID    . TOTAL ABDOMINAL HYSTERECTOMY  1994    Social History   Socioeconomic History  . Marital status: Single    Spouse name: Not on file  . Number of children: 0  . Years of education: Not on file  . Highest education level: Not on file  Occupational History  . Occupation: disabled  Social Needs  . Financial resource strain: Not on file  . Food insecurity    Worry: Not on file    Inability: Not on file  . Transportation needs    Medical: Not on file    Non-medical: Not on file  Tobacco Use  . Smoking status: Current Every Day Smoker    Packs/day: 0.50    Years: 35.00    Pack years: 17.50    Types: Cigarettes  . Smokeless tobacco: Never Used  . Tobacco comment: 07/26/2012 has decreased smoking from 2ppd; offered cessation materials; pt declines  Substance and Sexual Activity  . Alcohol use: Yes    Alcohol/week: 1.0 standard drinks    Types: 1 Glasses of wine per week    Comment: rarely  . Drug use: No   . Sexual activity: Never    Birth control/protection: Surgical  Lifestyle  . Physical activity    Days per week: Not on file    Minutes per session: Not on file  . Stress: Not on file  Relationships  . Social Herbalist on phone: Not on file    Gets together: Not on file    Attends religious service: Not on file    Active member of club or organization: Not on file    Attends meetings of clubs or organizations: Not on file    Relationship  status: Not on file  . Intimate partner violence    Fear of current or ex partner: Not on file    Emotionally abused: Not on file    Physically abused: Not on file    Forced sexual activity: Not on file  Other Topics Concern  . Not on file  Social History Narrative   Currently unemployed.  Really wants to find a job, but has been difficult for her.  No children, not married     Allergies  Allergen Reactions  . Atorvastatin Other (See Comments) and Nausea And Vomiting    "legs cramped; moderate to almost severe" "legs cramped; moderate to almost severe"  . Ibuprofen Other (See Comments)    Other reaction(s): Other (See Comments) Per pt, MD doesn't want pt to take Per pt, MD doesn't want pt to take  . Metformin And Related Nausea And Vomiting  . Ace Inhibitors Cough and Nausea And Vomiting  . Hydrocodone-Acetaminophen Nausea And Vomiting  . Albuterol Other (See Comments)    Caused SVT     Outpatient Medications Prior to Visit  Medication Sig Dispense Refill  . acetaminophen (TYLENOL) 325 MG tablet Take 2 tablets (650 mg total) by mouth every 6 (six) hours as needed for mild pain. 90 tablet 2  . albuterol (VENTOLIN HFA) 108 (90 Base) MCG/ACT inhaler Inhale 2 puffs into the lungs every 6 (six) hours as needed for wheezing or shortness of breath. 1 Inhaler 0  . allopurinol (ZYLOPRIM) 100 MG tablet     . aspirin EC 81 MG tablet Take 1 tablet (81 mg total) by mouth daily. 90 tablet 3  . Blood Glucose Monitoring Suppl (ACCU-CHEK  AVIVA PLUS) w/Device KIT Patient is to test four times daily. Dx. E11.42 1 kit 1  . busPIRone (BUSPAR) 15 MG tablet Take 15 mg by mouth 2 (two) times daily.     Marland Kitchen FLUoxetine (PROZAC) 20 MG capsule TAKE 1 CAPSULE (20 MG TOTAL) BY MOUTH AT BEDTIME. 90 capsule 2  . gabapentin (NEURONTIN) 400 MG capsule TAKE 1 CAPSULE BY MOUTH THREE TIMES A DAY 270 capsule 2  . glucose blood (ONETOUCH VERIO) test strip For E11.22 100 strip 12  . insulin aspart (NOVOLOG FLEXPEN) 100 UNIT/ML FlexPen Inject 30-40 Units into the skin 3 (three) times daily with meals. 10 pen 5  . Insulin Glargine (TOUJEO SOLOSTAR) 300 UNIT/ML SOPN Inject 110 Units into the skin 2 (two) times daily. (Patient taking differently: Inject 120 Units into the skin 2 (two) times daily. ) 8 pen 3  . Insulin Glargine, 1 Unit Dial, (TOUJEO SOLOSTAR) 300 UNIT/ML SOPN Inject 120 Units into the skin daily. 27 pen 1  . isosorbide mononitrate (IMDUR) 60 MG 24 hr tablet TAKE 1 TABLET (60 MG TOTAL) BY MOUTH DAILY FOR 30 DAYS. ** DO NOT CRUSH ** 90 tablet 1  . nitroGLYCERIN (NITROSTAT) 0.4 MG SL tablet Place 1 tablet (0.4 mg total) under the tongue every 5 (five) minutes as needed. 25 tablet 3  . omeprazole (PRILOSEC) 40 MG capsule Take 30- 60 min before your first and last meals of the day 60 capsule 2  . potassium chloride SA (KLOR-CON M20) 20 MEQ tablet Take 1 tablet (20 mEq total) by mouth daily. Please make overdue appt with Dr. Harrington Challenger before anymore refills. 1st attempt 30 tablet 0  . rosuvastatin (CRESTOR) 20 MG tablet Take 1 tablet (20 mg total) by mouth daily. 90 tablet 2  . topiramate (TOPAMAX) 25 MG tablet Take 1 tablet (25 mg  total) by mouth 2 (two) times daily. 180 tablet 1  . valsartan (DIOVAN) 40 MG tablet     . varenicline (CHANTIX STARTING MONTH PAK) 0.5 MG X 11 & 1 MG X 42 tablet Take one 0.5 mg tab po once daily for 3 days, then increase to one 0.5 mg tab bid for 4 days, then increase to one 1 mg tab bid. 53 tablet 0  . Verapamil HCl CR 300 MG  CP24 TAKE 1 CAPSULE (300 MG TOTAL) BY MOUTH DAILY. PLEASE CALL TO SCHEDULE OVERDUE APPT WITH DR ROSS 30 capsule 0   No facility-administered medications prior to visit.     Review of Systems  Constitutional: Negative for chills, fever, malaise/fatigue and weight loss.       Weight gain  HENT: Negative for hearing loss, sore throat and tinnitus.   Eyes: Negative for blurred vision and double vision.  Respiratory: Positive for shortness of breath and wheezing. Negative for cough, hemoptysis, sputum production and stridor.   Cardiovascular: Positive for chest pain, orthopnea and leg swelling. Negative for palpitations and PND.  Gastrointestinal: Positive for abdominal pain. Negative for constipation, diarrhea, heartburn, nausea and vomiting.       Bloating  Genitourinary: Negative for dysuria, hematuria and urgency.  Musculoskeletal: Negative for joint pain and myalgias.  Skin: Negative for itching and rash.  Neurological: Negative for dizziness, tingling, weakness and headaches.  Endo/Heme/Allergies: Negative for environmental allergies. Does not bruise/bleed easily.  Psychiatric/Behavioral: Negative for depression. The patient is not nervous/anxious and does not have insomnia.   All other systems reviewed and are negative.    Objective:  Physical Exam Vitals signs reviewed.  Constitutional:      General: She is not in acute distress.    Appearance: She is well-developed. She is obese.  HENT:     Head: Normocephalic and atraumatic.  Eyes:     General: No scleral icterus.    Conjunctiva/sclera: Conjunctivae normal.     Pupils: Pupils are equal, round, and reactive to light.  Neck:     Musculoskeletal: Neck supple.     Vascular: No JVD.     Trachea: No tracheal deviation.  Cardiovascular:     Rate and Rhythm: Normal rate and regular rhythm.     Heart sounds: Normal heart sounds. No murmur.  Pulmonary:     Effort: Pulmonary effort is normal. No tachypnea, accessory muscle usage  or respiratory distress.     Breath sounds: No stridor. No wheezing, rhonchi or rales.  Abdominal:     General: Bowel sounds are normal. There is distension.     Tenderness: There is no abdominal tenderness.     Comments: Firm distended  Musculoskeletal:        General: No tenderness.     Right lower leg: Edema present.     Left lower leg: Edema present.  Lymphadenopathy:     Cervical: No cervical adenopathy.  Skin:    General: Skin is warm and dry.     Capillary Refill: Capillary refill takes less than 2 seconds.     Findings: No rash.  Neurological:     Mental Status: She is alert and oriented to person, place, and time.  Psychiatric:        Behavior: Behavior normal.      Vitals:   08/16/19 1458  BP: (!) 148/100  Pulse: 95  Temp: (!) 97.3 F (36.3 C)  TempSrc: Temporal  SpO2: 91%  Weight: 257 lb (116.6 kg)  Height: _0  (  1.575 m)   91% on RA BMI Readings from Last 3 Encounters:  08/16/19 47.01 kg/m  07/04/19 44.96 kg/m  05/23/19 44.24 kg/m   Wt Readings from Last 3 Encounters:  08/16/19 257 lb (116.6 kg)  07/04/19 249 lb 12.8 oz (113.3 kg)  05/23/19 245 lb 12.8 oz (111.5 kg)     CBC    Component Value Date/Time   WBC 8.3 01/05/2019 1342   RBC 5.52 (H) 01/05/2019 1342   HGB 16.0 (H) 01/05/2019 1342   HGB 16.0 (H) 07/27/2017 1218   HCT 48.4 (H) 01/05/2019 1342   HCT 47.0 (H) 07/27/2017 1218   PLT 215.0 01/05/2019 1342   PLT 242 07/27/2017 1218   MCV 87.7 01/05/2019 1342   MCV 92 07/27/2017 1218   MCH 27.4 12/29/2018 1050   MCHC 33.1 01/05/2019 1342   RDW 16.1 (H) 01/05/2019 1342   RDW 14.5 07/27/2017 1218   LYMPHSABS 1.7 01/05/2019 1342   MONOABS 0.4 01/05/2019 1342   EOSABS 0.2 01/05/2019 1342   BASOSABS 0.0 01/05/2019 1342     Chest Imaging:  01/05/2019: Cardiomegaly vascular congestion and small bilateral effusion. The patient's images have been independently reviewed by me.    Pulmonary Functions Testing Results: PFT Results  Latest Ref Rng & Units 01/13/2018  FVC-Pre L 1.64  FVC-Predicted Pre % 53  FVC-Post L 1.57  FVC-Predicted Post % 51  Pre FEV1/FVC % % 78  Post FEV1/FCV % % 87  FEV1-Pre L 1.27  FEV1-Predicted Pre % 54  FEV1-Post L 1.36  DLCO UNC% % 61  DLCO COR %Predicted % 104  TLC L 2.90  TLC % Predicted % 61  RV % Predicted % 53    FeNO: None   Pathology: None   Echocardiogram:   Study Conclusions  - Left ventricle: The cavity size was normal. There was severe   concentric hypertrophy. Systolic function was normal. The   estimated ejection fraction was in the range of 60% to 65%. Wall   motion was normal; there were no regional wall motion   abnormalities. Features are consistent with a pseudonormal left   ventricular filling pattern, with concomitant abnormal relaxation   and increased filling pressure (grade 2 diastolic dysfunction).   Doppler parameters are consistent with high ventricular filling   pressure. - Aortic valve: Trileaflet; normal thickness, mildly calcified   leaflets. - Mitral valve: Calcified annulus. There was mild regurgitation   directed eccentrically. - Pulmonary arteries: Systolic pressure could not be accurately   estimated.  Heart Catheterization:  2018  Prox LAD lesion, 35 %stenosed.  Ost 2nd Mrg to 2nd Mrg lesion, 25 %stenosed.  LV end diastolic pressure is mildly elevated.  Hemodynamic findings consistent with moderate pulmonary hypertension. 1. Minor nonobstructive CAD 2. Mildly elevated LVEDP 3. Moderate pulmonary HTN. Plan: medical management. Weight loss and optimal therapy for OSA.     Assessment & Plan:     ICD-10-CM   1. Hypoxemia  R09.02   2. Fluid retention  R60.9   3. Bilateral lower extremity edema  R60.0   4. Morbid obesity due to excess calories ([facility]CC)  E66.01   5. Pulmonary hypertension, unspecified ([facility])  I27.20   6. OSA (obstructive sleep apnea)  G47.33   7. Cigarette smoker  F17.210     Discussion:  This is a  63 year old female with hypoxemic respiratory failure presents to clinic with O2 saturations on ambulation in the mid 80s on evaluation today.  She has had progressive dyspnea on exertion for  the past several weeks.  She is also noticed significant fluid tension within her abdomen and bilateral lower extremities.  She has a prior diagnosis of pulmonary hypertension which is likely secondary to her obesity, OSA as well as chronic diastolic heart failure.  Last echocardiogram with grade 2 diastolic dysfunction in conjunction with chronic kidney disease and an elevated serum creatinine.  I think with her ongoing decompensation and worsening symptoms over the past several days at home off oral diuretics I think that she needs evaluation in the emergency room and potential admission to the hospital for IV diuresis.  I discussed the risk benefits and alternatives of trying to do this outpatient versus inpatient admission.  She agrees that the safest place to have this done would be evaluation in the emergency department.  We also discussed risk benefits and alternatives of having self transport to the emergency room for evaluation versus ambulance transport.  She does have someone with her today in the office.  This friend will drive her to the hospital they would prefer to go there this way.  I have called and spoke with the [facility] emergency room and let them be aware of this patient's arrival.  Patient was also counseled again on smoking cessation.  Greater than 50% of this patient's 45-minute of visit was spent face-to-face discussing the above recommendations and treatment plan as well as counseling on the best diagnostic and further evaluation approach.  Additional time spent coordinating care and speaking with the [facility] emergency room.   Current Outpatient Medications:  .  acetaminophen (TYLENOL) 325 MG tablet, Take 2 tablets (650 mg total) by mouth every 6 (six) hours as needed for mild  pain., Disp: 90 tablet, Rfl: 2 .  albuterol (VENTOLIN HFA) 108 (90 Base) MCG/ACT inhaler, Inhale 2 puffs into the lungs every 6 (six) hours as needed for wheezing or shortness of breath., Disp: 1 Inhaler, Rfl: 0 .  allopurinol (ZYLOPRIM) 100 MG tablet, , Disp: , Rfl:  .  aspirin EC 81 MG tablet, Take 1 tablet (81 mg total) by mouth daily., Disp: 90 tablet, Rfl: 3 .  Blood Glucose Monitoring Suppl (ACCU-CHEK AVIVA PLUS) w/Device KIT, Patient is to test four times daily. Dx. E11.42, Disp: 1 kit, Rfl: 1 .  busPIRone (BUSPAR) 15 MG tablet, Take 15 mg by mouth 2 (two) times daily. , Disp: , Rfl:  .  FLUoxetine (PROZAC) 20 MG capsule, TAKE 1 CAPSULE (20 MG TOTAL) BY MOUTH AT BEDTIME., Disp: 90 capsule, Rfl: 2 .  gabapentin (NEURONTIN) 400 MG capsule, TAKE 1 CAPSULE BY MOUTH THREE TIMES A DAY, Disp: 270 capsule, Rfl: 2 .  glucose blood (ONETOUCH VERIO) test strip, For E11.22, Disp: 100 strip, Rfl: 12 .  insulin aspart (NOVOLOG FLEXPEN) 100 UNIT/ML FlexPen, Inject 30-40 Units into the skin 3 (three) times daily with meals., Disp: 10 pen, Rfl: 5 .  Insulin Glargine (TOUJEO SOLOSTAR) 300 UNIT/ML SOPN, Inject 110 Units into the skin 2 (two) times daily. (Patient taking differently: Inject 120 Units into the skin 2 (two) times daily. ), Disp: 8 pen, Rfl: 3 .  Insulin Glargine, 1 Unit Dial, (TOUJEO SOLOSTAR) 300 UNIT/ML SOPN, Inject 120 Units into the skin daily., Disp: 27 pen, Rfl: 1 .  isosorbide mononitrate (IMDUR) 60 MG 24 hr tablet, TAKE 1 TABLET (60 MG TOTAL) BY MOUTH DAILY FOR 30 DAYS. ** DO NOT CRUSH **, Disp: 90 tablet, Rfl: 1 .  nitroGLYCERIN (NITROSTAT) 0.4 MG SL tablet, Place 1 tablet (0.4 mg  total) under the tongue every 5 (five) minutes as needed., Disp: 25 tablet, Rfl: 3 .  omeprazole (PRILOSEC) 40 MG capsule, Take 30- 60 min before your first and last meals of the day, Disp: 60 capsule, Rfl: 2 .  potassium chloride SA (KLOR-CON M20) 20 MEQ tablet, Take 1 tablet (20 mEq total) by mouth daily. Please  make overdue appt with Dr. Harrington Challenger before anymore refills. 1st attempt, Disp: 30 tablet, Rfl: 0 .  rosuvastatin (CRESTOR) 20 MG tablet, Take 1 tablet (20 mg total) by mouth daily., Disp: 90 tablet, Rfl: 2 .  topiramate (TOPAMAX) 25 MG tablet, Take 1 tablet (25 mg total) by mouth 2 (two) times daily., Disp: 180 tablet, Rfl: 1 .  valsartan (DIOVAN) 40 MG tablet, , Disp: , Rfl:  .  varenicline (CHANTIX STARTING MONTH PAK) 0.5 MG X 11 & 1 MG X 42 tablet, Take one 0.5 mg tab po once daily for 3 days, then increase to one 0.5 mg tab bid for 4 days, then increase to one 1 mg tab bid., Disp: 53 tablet, Rfl: 0 .  Verapamil HCl CR 300 MG CP24, TAKE 1 CAPSULE (300 MG TOTAL) BY MOUTH DAILY. PLEASE CALL TO SCHEDULE OVERDUE APPT WITH DR ROSS, Disp: 30 capsule, Rfl: 0   Garner Nash, DO [facility] 08/16/2019 4:28 PM

## 2019-08-16 NOTE — Patient Instructions (Signed)
Thank you for visiting Dr. Valeta Harms at [facility]. Today we recommend the following:  Please see evaluation in the [facility] ED for weight gain, shortness of breath, and fluid retention.   Return in about 2 months (around 10/16/2019).    Please do your part to reduce the spread of COVID-19.

## 2019-08-17 ENCOUNTER — Encounter (HOSPITAL_COMMUNITY): Payer: Self-pay | Admitting: Internal Medicine

## 2019-08-17 ENCOUNTER — Other Ambulatory Visit: Payer: Self-pay

## 2019-08-17 LAB — GLUCOSE, CAPILLARY
Glucose-Capillary: 156 mg/dL — ABNORMAL HIGH (ref 70–99)
Glucose-Capillary: 175 mg/dL — ABNORMAL HIGH (ref 70–99)
Glucose-Capillary: 80 mg/dL (ref 70–99)
Glucose-Capillary: 165 mg/dL — ABNORMAL HIGH (ref 70–99)
Glucose-Capillary: 180 mg/dL — ABNORMAL HIGH (ref 70–99)

## 2019-08-17 LAB — CBC WITH DIFFERENTIAL/PLATELET
Abs Immature Granulocytes: 0.01 10*3/uL (ref 0.00–0.07)
Basophils Absolute: 0 10*3/uL (ref 0.0–0.1)
Basophils Relative: 1 %
Eosinophils Absolute: 0.2 10*3/uL (ref 0.0–0.5)
Eosinophils Relative: 2 %
HCT: 52.3 % — ABNORMAL HIGH (ref 36.0–46.0)
Hemoglobin: 16.2 g/dL — ABNORMAL HIGH (ref 12.0–15.0)
Immature Granulocytes: 0 %
Lymphocytes Relative: 24 %
Lymphs Abs: 1.8 10*3/uL (ref 0.7–4.0)
MCH: 28.7 pg (ref 26.0–34.0)
MCHC: 31 g/dL (ref 30.0–36.0)
MCV: 92.7 fL (ref 80.0–100.0)
Monocytes Absolute: 0.4 10*3/uL (ref 0.1–1.0)
Monocytes Relative: 6 %
Neutro Abs: 5.1 10*3/uL (ref 1.7–7.7)
Neutrophils Relative %: 67 %
Platelets: 184 10*3/uL (ref 150–400)
RBC: 5.64 MIL/uL — ABNORMAL HIGH (ref 3.87–5.11)
RDW: 16.5 % — ABNORMAL HIGH (ref 11.5–15.5)
WBC: 7.6 10*3/uL (ref 4.0–10.5)
nRBC: 0 % (ref 0.0–0.2)

## 2019-08-17 LAB — MAGNESIUM: Magnesium: 1.7 mg/dL (ref 1.7–2.4)

## 2019-08-17 LAB — COMPREHENSIVE METABOLIC PANEL
ALT: 12 U/L (ref 0–44)
AST: 20 U/L (ref 15–41)
Albumin: 3.6 g/dL (ref 3.5–5.0)
Alkaline Phosphatase: 88 U/L (ref 38–126)
Anion gap: 14 (ref 5–15)
BUN: 19 mg/dL (ref 8–23)
CO2: 28 mmol/L (ref 22–32)
Calcium: 9.2 mg/dL (ref 8.9–10.3)
Chloride: 101 mmol/L (ref 98–111)
Creatinine, Ser: 1.8 mg/dL — ABNORMAL HIGH (ref 0.44–1.00)
GFR calc Af Amer: 34 mL/min — ABNORMAL LOW (ref >60)
GFR calc non Af Amer: 30 mL/min — ABNORMAL LOW (ref >60)
Glucose, Bld: 165 mg/dL — ABNORMAL HIGH (ref 70–99)
Potassium: 3.3 mmol/L — ABNORMAL LOW (ref 3.5–5.1)
Sodium: 143 mmol/L (ref 135–145)
Total Bilirubin: 1.7 mg/dL — ABNORMAL HIGH (ref 0.3–1.2)
Total Protein: 6.4 g/dL — ABNORMAL LOW (ref 6.5–8.1)

## 2019-08-17 LAB — HIV ANTIBODY (ROUTINE TESTING W REFLEX): HIV Screen 4th Generation wRfx: NONREACTIVE

## 2019-08-17 LAB — BRAIN NATRIURETIC PEPTIDE: B Natriuretic Peptide: 1427.6 pg/mL — ABNORMAL HIGH (ref 0.0–100.0)

## 2019-08-17 LAB — TSH: TSH: 4.742 u[IU]/mL — ABNORMAL HIGH (ref 0.350–4.500)

## 2019-08-17 LAB — SARS CORONAVIRUS 2 BY RT PCR (HOSPITAL ORDER, PERFORMED IN ~~LOC~~ HOSPITAL LAB): SARS Coronavirus 2: NEGATIVE

## 2019-08-17 MED ORDER — VERAPAMIL HCL ER 300 MG PO CP24: 300.0000 mg | ORAL_CAPSULE | Freq: Every day | ORAL | Status: DC

## 2019-08-17 MED ORDER — ALBUTEROL SULFATE (2.5 MG/3ML) 0.083% IN NEBU: 3.0000 mL | INHALATION_SOLUTION | Freq: Four times a day (QID) | RESPIRATORY_TRACT | Status: DC | PRN

## 2019-08-17 MED ORDER — GABAPENTIN 400 MG PO CAPS
400.0000 mg | ORAL_CAPSULE | Freq: Three times a day (TID) | ORAL | Status: DC
  Administered 2019-08-17 – 2019-08-23 (×21): 400 mg via ORAL
  Filled 2019-08-17 (×21): qty 1

## 2019-08-17 MED ORDER — ALLOPURINOL 100 MG PO TABS
100.0000 mg | ORAL_TABLET | Freq: Every day | ORAL | Status: DC
  Administered 2019-08-17 – 2019-08-26 (×10): 100 mg via ORAL
  Filled 2019-08-17 (×10): qty 1

## 2019-08-17 MED ORDER — ONDANSETRON HCL 4 MG/2ML IJ SOLN: 4.0000 mg | Freq: Four times a day (QID) | INTRAMUSCULAR | Status: DC | PRN

## 2019-08-17 MED ORDER — NICOTINE 21 MG/24HR TD PT24
21.0000 mg | MEDICATED_PATCH | Freq: Every day | TRANSDERMAL | Status: DC
  Administered 2019-08-17 – 2019-08-26 (×10): 21 mg via TRANSDERMAL
  Filled 2019-08-17 (×10): qty 1

## 2019-08-17 MED ORDER — HYDRALAZINE HCL 20 MG/ML IJ SOLN
10.0000 mg | INTRAMUSCULAR | Status: DC | PRN
  Administered 2019-08-17 – 2019-08-24 (×5): 10 mg via INTRAVENOUS
  Filled 2019-08-17 (×5): qty 1

## 2019-08-17 MED ORDER — INSULIN ASPART 100 UNIT/ML ~~LOC~~ SOLN
20.0000 [IU] | Freq: Three times a day (TID) | SUBCUTANEOUS | Status: DC
  Administered 2019-08-17 – 2019-08-19 (×6): 20 [IU] via SUBCUTANEOUS

## 2019-08-17 MED ORDER — FUROSEMIDE 10 MG/ML IJ SOLN
40.0000 mg | Freq: Two times a day (BID) | INTRAMUSCULAR | Status: DC
  Administered 2019-08-17 – 2019-08-18 (×3): 40 mg via INTRAVENOUS
  Filled 2019-08-17 (×3): qty 4

## 2019-08-17 MED ORDER — FLUOXETINE HCL 20 MG PO CAPS
20.0000 mg | ORAL_CAPSULE | Freq: Every day | ORAL | Status: DC
  Administered 2019-08-17 – 2019-08-25 (×9): 20 mg via ORAL
  Filled 2019-08-17 (×9): qty 1

## 2019-08-17 MED ORDER — INSULIN GLARGINE 100 UNIT/ML ~~LOC~~ SOLN
120.0000 [IU] | Freq: Every day | SUBCUTANEOUS | Status: DC
  Administered 2019-08-18 – 2019-08-19 (×2): 120 [IU] via SUBCUTANEOUS
  Filled 2019-08-17 (×3): qty 1.2

## 2019-08-17 MED ORDER — ENOXAPARIN SODIUM 40 MG/0.4ML ~~LOC~~ SOLN
40.0000 mg | SUBCUTANEOUS | Status: DC
  Administered 2019-08-17 – 2019-08-18 (×2): 40 mg via SUBCUTANEOUS
  Filled 2019-08-17 (×2): qty 0.4

## 2019-08-17 MED ORDER — ROSUVASTATIN CALCIUM 20 MG PO TABS
20.0000 mg | ORAL_TABLET | Freq: Every day | ORAL | Status: DC
  Administered 2019-08-17 – 2019-08-26 (×10): 20 mg via ORAL
  Filled 2019-08-17 (×10): qty 1

## 2019-08-17 MED ORDER — VERAPAMIL HCL ER 180 MG PO TBCR
300.0000 mg | EXTENDED_RELEASE_TABLET | Freq: Every day | ORAL | Status: DC
  Administered 2019-08-17 – 2019-08-19 (×3): 300 mg via ORAL
  Filled 2019-08-17 (×4): qty 1

## 2019-08-17 MED ORDER — ACETAMINOPHEN 325 MG PO TABS
650.0000 mg | ORAL_TABLET | Freq: Four times a day (QID) | ORAL | Status: DC | PRN
  Administered 2019-08-18 – 2019-08-24 (×13): 650 mg via ORAL
  Filled 2019-08-17 (×13): qty 2

## 2019-08-17 MED ORDER — ONDANSETRON HCL 4 MG PO TABS: 4.0000 mg | ORAL_TABLET | Freq: Four times a day (QID) | ORAL | Status: DC | PRN

## 2019-08-17 MED ORDER — POTASSIUM CHLORIDE CRYS ER 20 MEQ PO TBCR
40.0000 meq | EXTENDED_RELEASE_TABLET | Freq: Once | ORAL | Status: AC
  Administered 2019-08-17: 12:00:00 40 meq via ORAL
  Filled 2019-08-17: qty 2

## 2019-08-17 MED ORDER — ASPIRIN EC 81 MG PO TBEC
81.0000 mg | DELAYED_RELEASE_TABLET | Freq: Every day | ORAL | Status: DC
  Administered 2019-08-17 – 2019-08-26 (×10): 81 mg via ORAL
  Filled 2019-08-17 (×10): qty 1

## 2019-08-17 MED ORDER — TOPIRAMATE 25 MG PO TABS
25.0000 mg | ORAL_TABLET | Freq: Two times a day (BID) | ORAL | Status: DC
  Administered 2019-08-17 – 2019-08-26 (×19): 25 mg via ORAL
  Filled 2019-08-17 (×21): qty 1

## 2019-08-17 MED ORDER — ISOSORBIDE MONONITRATE ER 60 MG PO TB24
60.0000 mg | ORAL_TABLET | Freq: Every day | ORAL | Status: DC
  Administered 2019-08-17 – 2019-08-20 (×4): 60 mg via ORAL
  Filled 2019-08-17 (×5): qty 1

## 2019-08-17 MED ORDER — ACETAMINOPHEN 650 MG RE SUPP: 650.0000 mg | Freq: Four times a day (QID) | RECTAL | Status: DC | PRN

## 2019-08-17 MED ORDER — PANTOPRAZOLE SODIUM 40 MG PO TBEC
40.0000 mg | DELAYED_RELEASE_TABLET | Freq: Every day | ORAL | Status: DC
  Administered 2019-08-17 – 2019-08-26 (×10): 40 mg via ORAL
  Filled 2019-08-17: qty 1
  Filled 2019-08-17: qty 2
  Filled 2019-08-17 (×8): qty 1

## 2019-08-17 MED ORDER — INSULIN ASPART 100 UNIT/ML ~~LOC~~ SOLN
0.0000 [IU] | Freq: Three times a day (TID) | SUBCUTANEOUS | Status: DC
  Administered 2019-08-17 (×2): 2 [IU] via SUBCUTANEOUS
  Administered 2019-08-18 (×2): 1 [IU] via SUBCUTANEOUS
  Administered 2019-08-18 – 2019-08-21 (×4): 2 [IU] via SUBCUTANEOUS
  Administered 2019-08-21: 1 [IU] via SUBCUTANEOUS
  Administered 2019-08-22 (×2): 2 [IU] via SUBCUTANEOUS
  Administered 2019-08-22: 1 [IU] via SUBCUTANEOUS
  Administered 2019-08-23 (×2): 2 [IU] via SUBCUTANEOUS
  Administered 2019-08-23 – 2019-08-24 (×3): 1 [IU] via SUBCUTANEOUS

## 2019-08-17 MED ORDER — IRBESARTAN 75 MG PO TABS
37.5000 mg | ORAL_TABLET | Freq: Every day | ORAL | Status: DC
  Administered 2019-08-17: 37.5 mg via ORAL
  Filled 2019-08-17: qty 0.5

## 2019-08-17 MED ORDER — NITROGLYCERIN 0.4 MG SL SUBL: 0.4000 mg | SUBLINGUAL_TABLET | SUBLINGUAL | Status: DC | PRN

## 2019-08-17 MED ORDER — BUSPIRONE HCL 15 MG PO TABS
15.0000 mg | ORAL_TABLET | Freq: Two times a day (BID) | ORAL | Status: DC
  Administered 2019-08-17 – 2019-08-26 (×19): 15 mg via ORAL
  Filled 2019-08-17 (×19): qty 1

## 2019-08-17 MED ORDER — POTASSIUM CHLORIDE CRYS ER 20 MEQ PO TBCR
20.0000 meq | EXTENDED_RELEASE_TABLET | Freq: Every day | ORAL | Status: DC
  Administered 2019-08-17 – 2019-08-26 (×10): 20 meq via ORAL
  Filled 2019-08-17 (×2): qty 1
  Filled 2019-08-17: qty 2
  Filled 2019-08-17 (×7): qty 1

## 2019-08-17 NOTE — ED Notes (Signed)
ED TO INPATIENT HANDOFF REPORT  ED Nurse Name and Phone #: [phone]  S Name/Age/Gender [name] 63 y.o. female Room/Bed: 023C/023C  Code Status   Code Status: Prior  Home/SNF/Other Home Patient oriented to: self, place, time and situation Is this baseline? Yes   Triage Complete: Triage complete  Chief Complaint CHF, Fluid on lungs  Triage Note Pt reports she went to pulmonologist due to increased SOB. Was sent here for admission. Recently stopped her fluid pill due to creatinine level. SOB and CP started after stopped fluid pill.    Allergies Allergies  Allergen Reactions  . Atorvastatin Other (See Comments) and Nausea And Vomiting    "legs cramped; moderate to almost severe" "legs cramped; moderate to almost severe"  . Ibuprofen Other (See Comments)    Other reaction(s): Other (See Comments) Per pt, MD doesn't want pt to take Per pt, MD doesn't want pt to take  . Metformin And Related Nausea And Vomiting  . Ace Inhibitors Cough and Nausea And Vomiting  . Hydrocodone-Acetaminophen Nausea And Vomiting  . Albuterol Other (See Comments)    Caused SVT    Level of Care/Admitting Diagnosis ED Disposition    ED Disposition Condition Comment   Admit  Hospital Area: [facility] [[id number]]  Level of Care: Telemetry Cardiac [103]  Covid Evaluation: Asymptomatic Screening Protocol (No Symptoms)  Diagnosis: Acute diastolic CHF (congestive heart failure) [facility]) [702637]  Admitting Physician: Rise Patience [phone]  Attending Physician: Rise Patience [phone]  Estimated length of stay: past midnight tomorrow  Certification:: I certify this patient will need inpatient services for at least 2 midnights  PT Class (Do Not Modify): Inpatient [101]  PT Acc Code (Do Not Modify): Private [1]       B Medical/Surgery History Past Medical History:  Diagnosis Date  . Anxiety    doesn't take any meds for this  . Arteriosclerosis of coronary  artery 08/31/2011   Myoview 9/12: EF 44, no ischemia // Myoview 8/18: EF 57, low risk, possible small area of inferior ischemia // Minor nonobstructive CAD by cardiac catheterization - LHC 8/18: Proximal LAD 35, OM2 25  . Arthritis    knuckles  . Asthma   . Carotid artery disease ([place])    Carotid US 7/14: Bilateral ICA 40-59 // Carotid US ([facility]) 2/17: bilat plaque without sig stenosis  . Chronic diastolic CHF (congestive heart failure) ([place]) 07/26/2017   Echo 7/18:Severe conc LVH, EF 60-65, no RWMA, Gr 2 DD, MAC, mild MR // right and left heart cath 8/18: LVEDP 21  . Claustrophobia 07/26/2012  . COPD (chronic obstructive pulmonary disease) ([place])    "chronic bronchitis - about every winter"  . CSF leak    dizziness/headache - assoc symptoms // 2/2 encephalocele s/p skull base reconstruction  . Diverticulosis   . History of CVA (cerebrovascular accident) 03/2004   denies residual 07/26/2012  . Hx of gout   . Hyperlipidemia    takes Pravastatin daily  . Hypertensive heart disease with chronic diastolic congestive heart failure ([place]) 07/26/2017  . Internal hemorrhoids   . Kidney stones   . LVH (left ventricular hypertrophy)    Echo 8/18: Severe conc LVH, EF 60-65, no RWMA, Gr 2 DD, MAC, mild MR  . OSA (obstructive sleep apnea) 07/26/2012   prior CPAP - stopped due to being primary caregiver for dying parent (stopped in 2016)  . PSVT (paroxysmal supraventricular tachycardia) ([place])   . Pulmonary hypertension, unspecified ([place]) 08/14/2017   Right  heart cath 8/13:RVSP 66, PASP 57, mean PA 39, mean PWCP 18, LVEDP 21  . Type II diabetes mellitus ([place])    Past Surgical History:  Procedure Laterality Date  . ESOPHAGOGASTRODUODENOSCOPY (EGD) WITH PROPOFOL N/A 07/13/2015   Procedure: ESOPHAGOGASTRODUODENOSCOPY (EGD) WITH PROPOFOL;  Surgeon: Carol Ada, MD;  Location: [facility];  Service: Endoscopy;  Laterality: N/A;  . NASAL SINUS SURGERY  01/29/2012   Procedure: ENDOSCOPIC SINUS SURGERY WITH  STEALTH;  Surgeon: Ruby Cola, MD;  Location: [facility];  Service: ENT;  Laterality: N/A;  . NM MYOVIEW LTD  08/25/2011   Low risk study, no evidence of ischemia, EF 44%  . REFRACTIVE SURGERY  ~ 2010   left  . RIGHT/LEFT HEART CATH AND CORONARY ANGIOGRAPHY N/A 07/30/2017   Procedure: RIGHT/LEFT HEART CATH AND CORONARY ANGIOGRAPHY;  Surgeon: Martinique, Peter M, MD;  Location: [facility];  Service: Cardiovascular;  Laterality: N/A;  . SPHENOIDECTOMY  01/29/2012   Procedure: SPHENOIDECTOMY;  Surgeon: Ruby Cola, MD;  Location: [facility];  Service: ENT;  Laterality: Left;  REPAIR OF LEFT SPHENOID    . TOTAL ABDOMINAL HYSTERECTOMY  1994     A IV Location/Drains/Wounds Patient Lines/Drains/Airways Status   Active Line/Drains/Airways    Name:   Placement date:   Placement time:   Site:   Days:   Peripheral IV 08/17/19 Left Antecubital   08/17/19    0012    Antecubital   less than 1   Sheath 07/30/17 Right Venous;Brachial   07/30/17    1407    Venous;Brachial   748          Intake/Output Last 24 hours No intake or output data in the 24 hours ending 08/17/19 0308  Labs/Imaging Results for orders placed or performed during the hospital encounter of 08/16/19 (from the past 48 hour(s))  Basic metabolic panel     Status: Abnormal   Collection Time: 08/16/19  5:45 PM  Result Value Ref Range   Sodium 144 135 - 145 mmol/L   Potassium 4.0 3.5 - 5.1 mmol/L   Chloride 103 98 - 111 mmol/L   CO2 30 22 - 32 mmol/L   Glucose, Bld 182 (H) 70 - 99 mg/dL   BUN 18 8 - 23 mg/dL   Creatinine, Ser 1.72 (H) 0.44 - 1.00 mg/dL   Calcium 9.3 8.9 - 10.3 mg/dL   GFR calc non Af Amer 31 (L) >60 mL/min   GFR calc Af Amer 36 (L) >60 mL/min   Anion gap 11 5 - 15    Comment: Performed at [facility], [address], [place], [place] [address]  CBC     Status: Abnormal   Collection Time: 08/16/19  5:45 PM  Result Value Ref Range   WBC 7.4 4.0 - 10.5 K/uL   RBC 5.65 (H) 3.87 - 5.11 MIL/uL   Hemoglobin  16.4 (H) 12.0 - 15.0 g/dL   HCT 53.2 (H) 36.0 - 46.0 %   MCV 94.2 80.0 - 100.0 fL   MCH 29.0 26.0 - 34.0 pg   MCHC 30.8 30.0 - 36.0 g/dL   RDW 16.7 (H) 11.5 - 15.5 %   Platelets 184 150 - 400 K/uL   nRBC 0.0 0.0 - 0.2 %    Comment: Performed at [facility], [place] [address] [place], [place] [address]  Troponin I (High Sensitivity)     Status: Abnormal   Collection Time: 08/16/19  5:45 PM  Result Value Ref Range  Troponin I (High Sensitivity) 23 (H) <18 ng/L    Comment: (NOTE) Elevated high sensitivity troponin I (hsTnI) values and significant  changes across serial measurements may suggest ACS but many other  chronic and acute conditions are known to elevate hsTnI results.  Refer to the "Links" section for chest pain algorithms and additional  guidance. Performed at [facility], [place] [address] [place], [place] [address]   Troponin I (High Sensitivity)     Status: Abnormal   Collection Time: 08/16/19 11:11 PM  Result Value Ref Range   Troponin I (High Sensitivity) 29 (H) <18 ng/L    Comment: (NOTE) Elevated high sensitivity troponin I (hsTnI) values and significant  changes across serial measurements may suggest ACS but many other  chronic and acute conditions are known to elevate hsTnI results.  Refer to the "Links" section for chest pain algorithms and additional  guidance. Performed at [facility], [place] [address] [place], [place] [address]   SARS Coronavirus 2 [facility] order, Performed in [facility] hospital lab) Nasopharyngeal Nasopharyngeal Swab     Status: None   Collection Time: 08/17/19 12:38 AM   Specimen: Nasopharyngeal Swab  Result Value Ref Range   SARS Coronavirus 2 NEGATIVE NEGATIVE    Comment: (NOTE) If result is NEGATIVE SARS-CoV-2 target nucleic acids are NOT DETECTED. The SARS-CoV-2 RNA is generally detectable in upper and lower  respiratory specimens during the acute phase of infection. The lowest  concentration of SARS-CoV-2  viral copies this assay can detect is 250  copies / mL. A negative result does not preclude SARS-CoV-2 infection  and should not be used as the sole basis for treatment or other  patient management decisions.  A negative result may occur with  improper specimen collection / handling, submission of specimen other  than nasopharyngeal swab, presence of viral mutation(s) within the  areas targeted by this assay, and inadequate number of viral copies  (<250 copies / mL). A negative result must be combined with clinical  observations, patient history, and epidemiological information. If result is POSITIVE SARS-CoV-2 target nucleic acids are DETECTED. The SARS-CoV-2 RNA is generally detectable in upper and lower  respiratory specimens dur ing the acute phase of infection.  Positive  results are indicative of active infection with SARS-CoV-2.  Clinical  correlation with patient history and other diagnostic information is  necessary to determine patient infection status.  Positive results do  not rule out bacterial infection or co-infection with other viruses. If result is PRESUMPTIVE POSTIVE SARS-CoV-2 nucleic acids MAY BE PRESENT.   A presumptive positive result was obtained on the submitted specimen  and confirmed on repeat testing.  While 2019 novel coronavirus  (SARS-CoV-2) nucleic acids may be present in the submitted sample  additional confirmatory testing may be necessary for epidemiological  and / or clinical management purposes  to differentiate between  SARS-CoV-2 and other Sarbecovirus currently known to infect humans.  If clinically indicated additional testing with an alternate test  methodology [id number]) is advised. The SARS-CoV-2 RNA is generally  detectable in upper and lower respiratory sp ecimens during the acute  phase of infection. The expected result is Negative. Fact Sheet for Patients:  StrictlyIdeas.no Fact Sheet for Healthcare  Providers: BankingDealers.co.za This test is not yet approved or cleared by the [place] FDA and has been authorized for detection and/or diagnosis of SARS-CoV-2 by FDA under an Emergency Use Authorization (EUA).  This EUA will remain in effect (meaning this test can be  used) for the duration of the COVID-19 declaration under Section 564(b)(1) of the Act, 21 U.S.C. section 360bbb-3(b)(1), unless the authorization is terminated or revoked sooner. Performed at [facility], [place] [address], [place], [place] [address]    Dg Chest 2 View  Result Date: 08/16/2019 CLINICAL DATA:  62 year old female with chest pain and shortness of breath. EXAM: CHEST - 2 VIEW COMPARISON:  Chest radiograph dated 01/05/2019 FINDINGS: Small left pleural effusion and left lung base atelectasis versus infiltrate. Overall slight interval increase in the size of the left pleural effusion and associated left lung base opacity compared to the prior radiograph. The right lung remains clear. No pneumothorax. Stable cardiac silhouette. No acute osseous pathology. Degenerative changes of the spine. IMPRESSION: Small left pleural effusion and left lung base atelectasis versus infiltrate, increased in size since the prior radiograph. Electronically Signed   By: Anner Crete M.D.   On: 08/16/2019 19:01    Pending Labs Unresulted Labs (From admission, onward)    Start     Ordered   08/16/19 2253  Brain natriuretic peptide  Add-on,   AD     08/16/19 2252   Signed and Held  HIV antibody (Routine Testing)  Once,   R     Signed and Held   Signed and Held  CBC  (enoxaparin (LOVENOX)    CrCl >/= 30 ml/min)  Once,   R    Comments: Baseline for enoxaparin therapy IF NOT ALREADY DRAWN.  Notify MD if PLT < 100 K.    Signed and Held   Signed and Held  Creatinine, serum  (enoxaparin (LOVENOX)    CrCl >/= 30 ml/min)  Once,   R    Comments: Baseline for enoxaparin therapy IF NOT ALREADY DRAWN.    Signed  and Held   Signed and Held  Creatinine, serum  (enoxaparin (LOVENOX)    CrCl >/= 30 ml/min)  Weekly,   R    Comments: while on enoxaparin therapy    Signed and Held   Signed and Held  Comprehensive metabolic panel  Once,   R     Signed and Held   Signed and Held  Magnesium  Once,   R     Signed and Held   Signed and Held  CBC WITH DIFFERENTIAL  Once,   R     Signed and Held   Signed and Held  TSH  Once,   R     Signed and Held          Vitals/Pain Today's Vitals   08/16/19 1951 08/17/19 0100 08/17/19 0245 08/17/19 0253  BP: (!) 194/102 (!) 198/106 (!) 188/104 (!) 199/128  Pulse: 81 79 78   Resp: 18 (!) 25    Temp:      TempSrc:      SpO2: 93% 97% 93%   Weight:      Height:        Isolation Precautions No active isolations  Medications Medications  sodium chloride flush (NS) 0.9 % injection 3 mL (has no administration in time range)  hydrALAZINE (APRESOLINE) injection 10 mg (10 mg Intravenous Given 08/17/19 0253)  insulin aspart (novoLOG) injection 20 Units (has no administration in time range)  verapamil (CALAN-SR) CR tablet 300 mg (has no administration in time range)  ipratropium (ATROVENT HFA) inhaler 2 puff (2 puffs Inhalation Given 08/17/19 0033)  furosemide (LASIX) injection 40 mg (40 mg Intravenous Given 08/17/19 0013)  nitroGLYCERIN (NITROGLYN) 2 % ointment 1 inch (  1 inch Topical Given 08/17/19 0028)    Mobility walks with person assist Low fall risk    R Recommendations: See Admitting Provider Note  Report given to:   Additional Notes:

## 2019-08-17 NOTE — Progress Notes (Signed)
PROGRESS NOTE                                                                                                                                                                                                             Patient Demographics:    [name], is a 63 y.o. female, DOB - 09-04-1956, DGU:[id number]  Admit date - 08/16/2019   Admitting Physician Rise Patience, MD  Outpatient Primary MD for the patient is Luetta Nutting, DO  LOS - 1   Chief Complaint  Patient presents with  . Shortness of Breath  . Chest Pain       Brief Narrative    This is a no charge note, patient was admitted overnight, chart, imaging, and labs were reviewed   Subjective:    [name] today reports some dyspnea, generalized weakness, denies fever, chills or chest pain .    Assessment  & Plan :    Principal Problem:   Acute diastolic CHF (congestive heart failure) (HCC) Active Problems:   Essential hypertension   Morbid obesity due to excess calories (HCC)   HLD (hyperlipidemia)   OSA (obstructive sleep apnea)   COPD  GOLD 0 still smoking   Pulmonary hypertension, unspecified (HCC)   Type 2 diabetes mellitus with stage 3 chronic kidney disease, with long-term current use of insulin (HCC)  Acute on chronic diastolic CHF last EF measured in 2018 was 60 to 65% with grade 2 diastolic dysfunction. - Patient placed on Lasix 40 mg IV every 12.  May need higher dose closely follow intake output metabolic panel daily weights.  Hypertensive urgency -patient blood pressure was found to be elevated in the ER.  Patient has not taken her home medications.  Patient is received IV Lasix and nitroglycerin patch.  We will dose home medications and I have also added PRN IV hydralazine.  Follow blood pressure trends.  Diabetes mellitus type 2 on Lantus 120 units in the morning along with pre-meals insulin and I have ordered sliding scale coverage.  Sleep  apnea on CPAP.  COPD not actively wheezing continue inhalers.   Tobacco abuse advised about quitting.  Hyperlipidemia on statins.  History of CVA on statins and antiplatelet agents.  AKI on Chronic kidney disease stage III creatinine elevated at 0.8 today, baseline around 1.5, following with Dr. Hollie Salk from nephrology  hold her are currently she is on IV Lasix .  History of gout on allopurinol.  History of depression and anxiety on Prozac and BuSpar.   COVID-19 Labs  No results for input(s): DDIMER, FERRITIN, LDH, CRP in the last 72 hours.  Lab Results  Component Value Date   [place] NEGATIVE 08/17/2019     Code Status : Full code  Family Communication  : Discussed with patient  Disposition Plan  : Home once stable  Barriers For Discharge : Remains significantly dyspneic on physical exam, significant volume overload, in need of IV diuresis with close renal function monitoring given worsening creatinine  Consults  : None  Procedures  : None  DVT Prophylaxis  : Subcu Lovenox  Lab Results  Component Value Date   PLT 184 08/17/2019    Antibiotics  :    Anti-infectives (From admission, onward)   None        Objective:   Vitals:   08/17/19 0253 08/17/19 0331 08/17/19 0359 08/17/19 0802  BP: (!) 199/128 (!) 149/70 (!) 164/81 (!) 166/77  Pulse:  73 78 86  Resp:    17  Temp:   98 F (36.7 C) 98.1 F (36.7 C)  TempSrc:   Oral Oral  SpO2:  97% 95% 91%  Weight:   115.8 kg   Height:   5\' 2"  (1.575 m)     Wt Readings from Last 3 Encounters:  08/17/19 115.8 kg  08/16/19 116.6 kg  07/04/19 113.3 kg     Intake/Output Summary (Last 24 hours) at 08/17/2019 1120 Last data filed at 08/17/2019 1112 Gross per 24 hour  Intake 0 ml  Output 1750 ml  Net -1750 ml     Physical Exam  Awake Alert, Oriented X 3, No new F.N deficits, Normal affect Symmetrical Chest wall movement, diminished at the bases with bibasilar crackles RRR,No Gallops,Rubs or new Murmurs,  No Parasternal Heave +ve B.Sounds, Abd Soft, No rebound - guarding or rigidity. No Cyanosis, Clubbing ,+2  edema, No new Rash or bruise      Data Review:    CBC Recent Labs  Lab 08/16/19 1745 08/17/19 0510  WBC 7.4 7.6  HGB 16.4* 16.2*  HCT 53.2* 52.3*  PLT 184 184  MCV 94.2 92.7  MCH 29.0 28.7  MCHC 30.8 31.0  RDW 16.7* 16.5*  LYMPHSABS  --  1.8  MONOABS  --  0.4  EOSABS  --  0.2  BASOSABS  --  0.0    Chemistries  Recent Labs  Lab 08/16/19 1745 08/17/19 0510  NA 144 143  K 4.0 3.3*  CL 103 101  CO2 30 28  GLUCOSE 182* 165*  BUN 18 19  CREATININE 1.72* 1.80*  CALCIUM 9.3 9.2  MG  --  1.7  AST  --  20  ALT  --  12  ALKPHOS  --  88  BILITOT  --  1.7*   ------------------------------------------------------------------------------------------------------------------ No results for input(s): CHOL, HDL, LDLCALC, TRIG, CHOLHDL, LDLDIRECT in the last 72 hours.  Lab Results  Component Value Date   HGBA1C 8.2 (H) 05/23/2019   ------------------------------------------------------------------------------------------------------------------ Recent Labs    08/17/19 0457  TSH 4.742*   ------------------------------------------------------------------------------------------------------------------ No results for input(s): VITAMINB12, FOLATE, FERRITIN, TIBC, IRON, RETICCTPCT in the last 72 hours.  Coagulation profile No results for input(s): INR, PROTIME in the last 168 hours.  No results for input(s): DDIMER in the last 72 hours.  Cardiac Enzymes No results for input(s): CKMB, TROPONINI, MYOGLOBIN in the last  168 hours.  Invalid input(s): CK ------------------------------------------------------------------------------------------------------------------    Component Value Date/Time   BNP 1,427.6 (H) 08/17/2019 0457   BNP 43.7 11/07/2011 1436    Inpatient Medications  Scheduled Meds: . allopurinol  100 mg Oral Daily  . aspirin EC  81 mg Oral Daily   . busPIRone  15 mg Oral BID  . enoxaparin (LOVENOX) injection  40 mg Subcutaneous Q24H  . FLUoxetine  20 mg Oral QHS  . furosemide  40 mg Intravenous Q12H  . gabapentin  400 mg Oral TID  . insulin aspart  0-9 Units Subcutaneous TID WC  . insulin aspart  20 Units Subcutaneous TID WC  . insulin glargine  120 Units Subcutaneous Daily  . irbesartan  37.5 mg Oral Daily  . isosorbide mononitrate  60 mg Oral Daily  . nicotine  21 mg Transdermal Daily  . pantoprazole  40 mg Oral Daily  . potassium chloride SA  20 mEq Oral Daily  . rosuvastatin  20 mg Oral Daily  . topiramate  25 mg Oral BID  . verapamil  300 mg Oral Daily   Continuous Infusions: PRN Meds:.acetaminophen **OR** acetaminophen, albuterol, hydrALAZINE, nitroGLYCERIN, ondansetron **OR** ondansetron (ZOFRAN) IV  Micro Results Recent Results (from the past 240 hour(s))  SARS Coronavirus 2 [facility] order, Performed in [facility] hospital lab) Nasopharyngeal Nasopharyngeal Swab     Status: None   Collection Time: 08/17/19 12:38 AM   Specimen: Nasopharyngeal Swab  Result Value Ref Range Status   SARS Coronavirus 2 NEGATIVE NEGATIVE Final    Comment: (NOTE) If result is NEGATIVE SARS-CoV-2 target nucleic acids are NOT DETECTED. The SARS-CoV-2 RNA is generally detectable in upper and lower  respiratory specimens during the acute phase of infection. The lowest  concentration of SARS-CoV-2 viral copies this assay can detect is 250  copies / mL. A negative result does not preclude SARS-CoV-2 infection  and should not be used as the sole basis for treatment or other  patient management decisions.  A negative result may occur with  improper specimen collection / handling, submission of specimen other  than nasopharyngeal swab, presence of viral mutation(s) within the  areas targeted by this assay, and inadequate number of viral copies  (<250 copies / mL). A negative result must be combined with clinical  observations, patient  history, and epidemiological information. If result is POSITIVE SARS-CoV-2 target nucleic acids are DETECTED. The SARS-CoV-2 RNA is generally detectable in upper and lower  respiratory specimens dur ing the acute phase of infection.  Positive  results are indicative of active infection with SARS-CoV-2.  Clinical  correlation with patient history and other diagnostic information is  necessary to determine patient infection status.  Positive results do  not rule out bacterial infection or co-infection with other viruses. If result is PRESUMPTIVE POSTIVE SARS-CoV-2 nucleic acids MAY BE PRESENT.   A presumptive positive result was obtained on the submitted specimen  and confirmed on repeat testing.  While 2019 novel coronavirus  (SARS-CoV-2) nucleic acids may be present in the submitted sample  additional confirmatory testing may be necessary for epidemiological  and / or clinical management purposes  to differentiate between  SARS-CoV-2 and other Sarbecovirus currently known to infect humans.  If clinically indicated additional testing with an alternate test  methodology [phone]) is advised. The SARS-CoV-2 RNA is generally  detectable in upper and lower respiratory sp ecimens during the acute  phase of infection. The expected result is Negative. Fact Sheet for Patients:  StrictlyIdeas.no Fact Sheet  for Healthcare Providers: BankingDealers.co.za This test is not yet approved or cleared by the [place] and has been authorized for detection and/or diagnosis of SARS-CoV-2 by FDA under an Emergency Use Authorization (EUA).  This EUA will remain in effect (meaning this test can be used) for the duration of the COVID-19 declaration under Section 564(b)(1) of the Act, 21 U.S.C. section 360bbb-3(b)(1), unless the authorization is terminated or revoked sooner. Performed at [facility], [place] [address], [place], [place] [address]      Radiology Reports Dg Chest 2 View  Result Date: 08/16/2019 CLINICAL DATA:  63 year old female with chest pain and shortness of breath. EXAM: CHEST - 2 VIEW COMPARISON:  Chest radiograph dated 01/05/2019 FINDINGS: Small left pleural effusion and left lung base atelectasis versus infiltrate. Overall slight interval increase in the size of the left pleural effusion and associated left lung base opacity compared to the prior radiograph. The right lung remains clear. No pneumothorax. Stable cardiac silhouette. No acute osseous pathology. Degenerative changes of the spine. IMPRESSION: Small left pleural effusion and left lung base atelectasis versus infiltrate, increased in size since the prior radiograph. Electronically Signed   By: Anner Crete M.D.   On: 08/16/2019 19:01    Time Spent in minutes  No charge   Phillips Climes M.D on 08/17/2019 at 11:20 AM  Between 7am to 7pm - Pager - [phone]  After 7pm go to [email or web address] - password [facility]  [facility] -  Office  [phone]

## 2019-08-17 NOTE — Plan of Care (Signed)
  Problem: Elimination: Goal: Will not experience complications related to urinary retention Outcome: Progressing   Problem: Safety: Goal: Ability to remain free from injury will improve Outcome: Progressing   

## 2019-08-17 NOTE — ED Notes (Signed)
Report attempted to floor.   

## 2019-08-17 NOTE — H&P (Addendum)
History and Physical    [name] YYQ:[id number] DOB: February 05, 1956 DOA: 08/16/2019  PCP: Luetta Nutting, DO  Patient coming from: Home.  Chief Complaint: Shortness of breath.  HPI: [name] is a 63 y.o. female with history of chronic diastolic CHF, hypertension, chronic kidney disease, pulmonary hypertension diabetes mellitus, CVA, CSF rhinorrhea status post reconstruction surgery sleep apnea and ongoing tobacco abuse presents to the ER because of increasing shortness of breath over the last 3 weeks.  Patient states since renal function was worsening patient's Lasix was discontinued by primary care physician 3 weeks ago.  Since then patient has been having increasing weight gain with increasing peripheral edema exertional shortness of breath unable to lie flat with orthopnea paroxysmal nocturnal dyspnea.  Does have chest pressure.  Denies any fever chills has some productive cough.  ED Course: In the ER on exam patient has bilateral lower extremity edema extending up to the thigh.  Chest x-ray shows congestion.  Labs show creatinine 1.7 BNP pending WBC count 7.4 with high-sensitivity troponin XX 3 with EKG showing normal sinus rhythm.  Patient was given Lasix 40 mg IV and admitted for further management of acute on chronic diastolic CHF.  Review of Systems: As per HPI, rest all negative.   Past Medical History:  Diagnosis Date  . Anxiety    doesn't take any meds for this  . Arteriosclerosis of coronary artery 08/31/2011   Myoview 9/12: EF 44, no ischemia // Myoview 8/18: EF 57, low risk, possible small area of inferior ischemia // Minor nonobstructive CAD by cardiac catheterization - LHC 8/18: Proximal LAD 35, OM2 25  . Arthritis    knuckles  . Asthma   . Carotid artery disease ([place])    Carotid US 7/14: Bilateral ICA 40-59 // Carotid US ([facility]) 2/17: bilat plaque without sig stenosis  . Chronic diastolic CHF (congestive heart failure) ([place]) 07/26/2017   Echo 7/18:Severe  conc LVH, EF 60-65, no RWMA, Gr 2 DD, MAC, mild MR // right and left heart cath 8/18: LVEDP 21  . Claustrophobia 07/26/2012  . COPD (chronic obstructive pulmonary disease) ([place])    "chronic bronchitis - about every winter"  . CSF leak    dizziness/headache - assoc symptoms // 2/2 encephalocele s/p skull base reconstruction  . Diverticulosis   . History of CVA (cerebrovascular accident) 03/2004   denies residual 07/26/2012  . Hx of gout   . Hyperlipidemia    takes Pravastatin daily  . Hypertensive heart disease with chronic diastolic congestive heart failure ([place]) 07/26/2017  . Internal hemorrhoids   . Kidney stones   . LVH (left ventricular hypertrophy)    Echo 8/18: Severe conc LVH, EF 60-65, no RWMA, Gr 2 DD, MAC, mild MR  . OSA (obstructive sleep apnea) 07/26/2012   prior CPAP - stopped due to being primary caregiver for dying parent (stopped in 2016)  . PSVT (paroxysmal supraventricular tachycardia) ([place])   . Pulmonary hypertension, unspecified ([place]) 08/14/2017   Right heart cath 8/13:RVSP 66, PASP 57, mean PA 39, mean PWCP 18, LVEDP 21  . Type II diabetes mellitus ([place])     Past Surgical History:  Procedure Laterality Date  . ESOPHAGOGASTRODUODENOSCOPY (EGD) WITH PROPOFOL N/A 07/13/2015   Procedure: ESOPHAGOGASTRODUODENOSCOPY (EGD) WITH PROPOFOL;  Surgeon: Carol Ada, MD;  Location: [facility];  Service: Endoscopy;  Laterality: N/A;  . NASAL SINUS SURGERY  01/29/2012   Procedure: ENDOSCOPIC SINUS SURGERY WITH STEALTH;  Surgeon: Ruby Cola, MD;  Location: [facility];  Service:  ENT;  Laterality: N/A;  . NM MYOVIEW LTD  08/25/2011   Low risk study, no evidence of ischemia, EF 44%  . REFRACTIVE SURGERY  ~ 2010   left  . RIGHT/LEFT HEART CATH AND CORONARY ANGIOGRAPHY N/A 07/30/2017   Procedure: RIGHT/LEFT HEART CATH AND CORONARY ANGIOGRAPHY;  Surgeon: Martinique, Peter M, MD;  Location: [facility];  Service: Cardiovascular;  Laterality: N/A;  . SPHENOIDECTOMY  01/29/2012   Procedure:  SPHENOIDECTOMY;  Surgeon: Ruby Cola, MD;  Location: [facility];  Service: ENT;  Laterality: Left;  REPAIR OF LEFT SPHENOID    . TOTAL ABDOMINAL HYSTERECTOMY  1994     reports that she has been smoking cigarettes. She has a 17.50 pack-year smoking history. She has never used smokeless tobacco. She reports current alcohol use of about 1.0 standard drinks of alcohol per week. She reports that she does not use drugs.  Allergies  Allergen Reactions  . Atorvastatin Other (See Comments) and Nausea And Vomiting    "legs cramped; moderate to almost severe" "legs cramped; moderate to almost severe"  . Ibuprofen Other (See Comments)    Other reaction(s): Other (See Comments) Per pt, MD doesn't want pt to take Per pt, MD doesn't want pt to take  . Metformin And Related Nausea And Vomiting  . Ace Inhibitors Cough and Nausea And Vomiting  . Hydrocodone-Acetaminophen Nausea And Vomiting  . Albuterol Other (See Comments)    Caused SVT    Family History  Problem Relation Age of Onset  . Colon cancer Sister 65  . Breast cancer Mother 10  . Alzheimer's disease Mother   . Diabetes Sister 93  . Heart failure Sister   . Diabetes Sister 23  . Lung cancer Father   . Breast cancer Sister   . Cancer Sister        mouth  . Anesthesia problems Neg Hx   . Hypotension Neg Hx   . Malignant hyperthermia Neg Hx   . Pseudochol deficiency Neg Hx     Prior to Admission medications   Medication Sig Start Date End Date Taking? Authorizing Provider  acetaminophen (TYLENOL) 325 MG tablet Take 2 tablets (650 mg total) by mouth every 6 (six) hours as needed for mild pain. 06/30/18  Yes Luetta Nutting, DO  albuterol (VENTOLIN HFA) 108 (90 Base) MCG/ACT inhaler Inhale 2 puffs into the lungs every 6 (six) hours as needed for wheezing or shortness of breath. 04/21/19  Yes Luetta Nutting, DO  allopurinol (ZYLOPRIM) 100 MG tablet Take 100 mg by mouth daily.  06/24/18  Yes [provider]  aspirin EC 81 MG tablet  Take 1 tablet (81 mg total) by mouth daily. 07/13/17  Yes Weaver, Scott T, PA-C  busPIRone (BUSPAR) 15 MG tablet Take 15 mg by mouth 2 (two) times daily.  06/17/16  Yes [provider]  FLUoxetine (PROZAC) 20 MG capsule TAKE 1 CAPSULE (20 MG TOTAL) BY MOUTH AT BEDTIME. 07/31/19 08/30/19 Yes Matthews, Cody, DO  gabapentin (NEURONTIN) 400 MG capsule TAKE 1 CAPSULE BY MOUTH THREE TIMES A DAY Patient taking differently: Take 400 mg by mouth 3 (three) times daily.  04/25/19  Yes Luetta Nutting, DO  insulin aspart (NOVOLOG FLEXPEN) 100 UNIT/ML FlexPen Inject 30-40 Units into the skin 3 (three) times daily with meals. 07/04/19  Yes Luetta Nutting, DO  Insulin Glargine (TOUJEO SOLOSTAR) 300 UNIT/ML SOPN Inject 110 Units into the skin 2 (two) times daily. Patient taking differently: Inject 120 Units into the skin daily.  03/31/18  Yes Luetta Nutting, DO  isosorbide mononitrate (IMDUR) 60 MG 24 hr tablet TAKE 1 TABLET (60 MG TOTAL) BY MOUTH DAILY FOR 30 DAYS. ** DO NOT CRUSH ** 07/18/19 08/17/19 Yes Luetta Nutting, DO  nitroGLYCERIN (NITROSTAT) 0.4 MG SL tablet Place 1 tablet (0.4 mg total) under the tongue every 5 (five) minutes as needed. Patient taking differently: Place 0.4 mg under the tongue every 5 (five) minutes as needed for chest pain.  07/27/17  Yes Weaver, Scott T, PA-C  omeprazole (PRILOSEC) 40 MG capsule Take 30- 60 min before your first and last meals of the day Patient taking differently: Take 40 mg by mouth 2 (two) times daily.  01/13/18  Yes Tanda Rockers, MD  potassium chloride SA (KLOR-CON M20) 20 MEQ tablet Take 1 tablet (20 mEq total) by mouth daily. Please make overdue appt with Dr. Harrington Challenger before anymore refills. 1st attempt 10/12/18  Yes Weaver, Scott T, PA-C  rosuvastatin (CRESTOR) 20 MG tablet Take 1 tablet (20 mg total) by mouth daily. 07/04/19 10/02/19 Yes Luetta Nutting, DO  topiramate (TOPAMAX) 25 MG tablet Take 1 tablet (25 mg total) by mouth 2 (two) times daily. 07/04/19  Yes Luetta Nutting, DO  valsartan (DIOVAN) 40 MG tablet Take 40 mg by mouth daily.  02/23/19  Yes [provider]  Verapamil HCl CR 300 MG CP24 TAKE 1 CAPSULE (300 MG TOTAL) BY MOUTH DAILY. PLEASE CALL TO SCHEDULE OVERDUE APPT WITH DR ROSS Patient taking differently: Take 300 mg by mouth daily.  08/04/19  Yes Luetta Nutting, DO  Blood Glucose Monitoring Suppl (ACCU-CHEK AVIVA PLUS) w/Device KIT Patient is to test four times daily. Dx. N82.95 11/03/18   Luetta Nutting, DO  glucose blood [facility] VERIO) test strip For E11.22 07/11/19   Luetta Nutting, DO  Insulin Glargine, 1 Unit Dial, (TOUJEO SOLOSTAR) 300 UNIT/ML SOPN Inject 120 Units into the skin daily. Patient not taking: Reported on 08/16/2019 07/04/19 10/02/19  Luetta Nutting, DO  varenicline (CHANTIX STARTING MONTH PAK) 0.5 MG X 11 & 1 MG X 42 tablet Take one 0.5 mg tab po once daily for 3 days, then increase to one 0.5 mg tab bid for 4 days, then increase to one 1 mg tab bid. 05/19/19   Luetta Nutting, DO    Physical Exam: Constitutional: Moderately built and nourished. Vitals:   08/16/19 1736 08/16/19 1739 08/16/19 1951  BP:  (!) 181/90 (!) 194/102  Pulse: 94  81  Resp: 20  18  Temp: 98.6 F (37 C)    TempSrc: Oral    SpO2: 92%  93%  Weight: 116 kg    Height: _0  (1.575 m)     Eyes: Nonicteric no pallor. ENMT: No discharge from the ears eyes nose or mouth. Neck: No mass or.  JVD appreciated. Respiratory: No rhonchi mild crepitations. Cardiovascular: S1-S2 heard. Abdomen: Soft mild distention no guarding no rigidity. Musculoskeletal: Bilateral lower extremity edema extending up to the thighs. Skin: No rash. Neurologic: Alert awake oriented to time place and person.  Moves all extremities. Psychiatric: Appears normal.   Labs on Admission: I have personally reviewed following labs and imaging studies  CBC: Recent Labs  Lab 08/16/19 1745  WBC 7.4  HGB 16.4*  HCT 53.2*  MCV 94.2  PLT 621   Basic Metabolic Panel: Recent Labs   Lab 08/16/19 1745  NA 144  K 4.0  CL 103  CO2 30  GLUCOSE 182*  BUN 18  CREATININE 1.72*  CALCIUM 9.3   GFR:  Estimated Creatinine Clearance: 41 mL/min (A) (by C-G formula based on SCr of 1.72 mg/dL (H)). Liver Function Tests: No results for input(s): AST, ALT, ALKPHOS, BILITOT, PROT, ALBUMIN in the last 168 hours. No results for input(s): LIPASE, AMYLASE in the last 168 hours. No results for input(s): AMMONIA in the last 168 hours. Coagulation Profile: No results for input(s): INR, PROTIME in the last 168 hours. Cardiac Enzymes: No results for input(s): CKTOTAL, CKMB, CKMBINDEX, TROPONINI in the last 168 hours. BNP (last 3 results) Recent Labs    05/23/19 1201  PROBNP 1,442.0*   HbA1C: No results for input(s): HGBA1C in the last 72 hours. CBG: No results for input(s): GLUCAP in the last 168 hours. Lipid Profile: No results for input(s): CHOL, HDL, LDLCALC, TRIG, CHOLHDL, LDLDIRECT in the last 72 hours. Thyroid Function Tests: No results for input(s): TSH, T4TOTAL, FREET4, T3FREE, THYROIDAB in the last 72 hours. Anemia Panel: No results for input(s): VITAMINB12, FOLATE, FERRITIN, TIBC, IRON, RETICCTPCT in the last 72 hours. Urine analysis:    Component Value Date/Time   COLORURINE YELLOW 05/12/2018 0357   APPEARANCEUR CLEAR 05/12/2018 0357   LABSPEC 1.017 05/12/2018 0357   PHURINE 5.0 05/12/2018 0357   GLUCOSEU >=500 (A) 05/12/2018 0357   HGBUR NEGATIVE 05/12/2018 0357   BILIRUBINUR 1+ 02/07/2019 1433   KETONESUR NEGATIVE 05/12/2018 0357   PROTEINUR Positive (A) 02/07/2019 1433   PROTEINUR >=300 (A) 05/12/2018 0357   UROBILINOGEN 0.2 02/07/2019 1433   UROBILINOGEN 0.2 07/07/2013 1353   NITRITE neg 02/07/2019 1433   NITRITE NEGATIVE 05/12/2018 0357   LEUKOCYTESUR Large (3+) (A) 02/07/2019 1433   Sepsis Labs: _0 (procalcitonin:4,lacticidven:4) )No results found for this or any previous visit (from the past 240 hour(s)).   Radiological Exams on  Admission: Dg Chest 2 View  Result Date: 08/16/2019 CLINICAL DATA:  63 year old female with chest pain and shortness of breath. EXAM: CHEST - 2 VIEW COMPARISON:  Chest radiograph dated 01/05/2019 FINDINGS: Small left pleural effusion and left lung base atelectasis versus infiltrate. Overall slight interval increase in the size of the left pleural effusion and associated left lung base opacity compared to the prior radiograph. The right lung remains clear. No pneumothorax. Stable cardiac silhouette. No acute osseous pathology. Degenerative changes of the spine. IMPRESSION: Small left pleural effusion and left lung base atelectasis versus infiltrate, increased in size since the prior radiograph. Electronically Signed   By: Anner Crete M.D.   On: 08/16/2019 19:01    EKG: Independently reviewed.  Normal sinus rhythm.  Assessment/Plan Principal Problem:   Acute diastolic CHF (congestive heart failure) (HCC) Active Problems:   Essential hypertension   Morbid obesity due to excess calories (H[facility])   HLD (hyperlipidemia)   OSA (obstructive sleep apnea)   COPD  GOLD 0 still smoking   Pulmonary hypertension, unspecified ([facility])   Type 2 diabetes mellitus with stage 3 chronic kidney disease, with long-term current use of insulin ([place])    1. Acute on chronic diastolic CHF last EF measured in 2018 was 60 to 65% with grade 2 diastolic dysfunction.  Patient placed on Lasix 40 mg IV every 12.  May need higher dose closely follow intake output metabolic panel daily weights. 2. Hypertensive urgency -patient blood pressure was found to be elevated in the ER.  Patient has not taken her home medications.  Patient is received IV Lasix and nitroglycerin patch.  We will dose home medications and I have also added PRN IV hydralazine.  Follow blood pressure trends. 3. Diabetes mellitus type  2 on Lantus 120 units in the morning along with pre-meals insulin and I have ordered sliding scale coverage. 4. Sleep apnea on  CPAP. 5. COPD not actively wheezing continue inhalers.   6. Tobacco abuse advised about quitting. 7. Hyperlipidemia on statins. 8. History of CVA on statins and antiplatelet agents. 9. Chronic kidney disease stage III creatinine appears to be at baseline closely follow metabolic panel.  Note that patient is on Lasix and ARB. 10. History of gout on allopurinol. 11. History of depression and anxiety on Prozac and BuSpar.  Given that patient has significant peripheral edema with shortness of breath and acute CHF patient will need close monitoring and adequate diuresis before discharge so patient will need to be inpatient status.   DVT prophylaxis: Lovenox. Code Status: Full code. Family Communication: Discussed with patient. Disposition Plan: Home. Consults called: None. Admission status: Inpatient.   Rise Patience MD [facility] Pager [phone].  If 7PM-7AM, please contact night-coverage [email or web address] Password [email or web address]  08/17/2019, 12:30 AM

## 2019-08-18 ENCOUNTER — Other Ambulatory Visit: Payer: Self-pay | Admitting: Family Medicine

## 2019-08-18 ENCOUNTER — Encounter (HOSPITAL_COMMUNITY): Payer: Self-pay | Admitting: Cardiology

## 2019-08-18 ENCOUNTER — Inpatient Hospital Stay (HOSPITAL_COMMUNITY): Payer: Medicare Other

## 2019-08-18 DIAGNOSIS — E669 Obesity, unspecified: Secondary | ICD-10-CM

## 2019-08-18 DIAGNOSIS — I272 Pulmonary hypertension, unspecified: Secondary | ICD-10-CM

## 2019-08-18 DIAGNOSIS — Z720 Tobacco use: Secondary | ICD-10-CM

## 2019-08-18 DIAGNOSIS — I361 Nonrheumatic tricuspid (valve) insufficiency: Secondary | ICD-10-CM

## 2019-08-18 DIAGNOSIS — I340 Nonrheumatic mitral (valve) insufficiency: Secondary | ICD-10-CM

## 2019-08-18 DIAGNOSIS — I5033 Acute on chronic diastolic (congestive) heart failure: Secondary | ICD-10-CM

## 2019-08-18 DIAGNOSIS — J449 Chronic obstructive pulmonary disease, unspecified: Secondary | ICD-10-CM

## 2019-08-18 DIAGNOSIS — I471 Supraventricular tachycardia: Secondary | ICD-10-CM

## 2019-08-18 DIAGNOSIS — G4730 Sleep apnea, unspecified: Secondary | ICD-10-CM

## 2019-08-18 LAB — URINALYSIS, ROUTINE W REFLEX MICROSCOPIC
Bilirubin Urine: NEGATIVE
Glucose, UA: NEGATIVE mg/dL
Ketones, ur: NEGATIVE mg/dL
Nitrite: POSITIVE — AB
Protein, ur: >=300 mg/dL — AB
Specific Gravity, Urine: 1.017 (ref 1.005–1.030)
WBC, UA: >50 WBC/hpf — ABNORMAL HIGH (ref 0–5)
pH: 5 (ref 5.0–8.0)

## 2019-08-18 LAB — BASIC METABOLIC PANEL
Anion gap: 11 (ref 5–15)
BUN: 20 mg/dL (ref 8–23)
CO2: 32 mmol/L (ref 22–32)
Calcium: 8.6 mg/dL — ABNORMAL LOW (ref 8.9–10.3)
Chloride: 100 mmol/L (ref 98–111)
Creatinine, Ser: 2.21 mg/dL — ABNORMAL HIGH (ref 0.44–1.00)
GFR calc Af Amer: 27 mL/min — ABNORMAL LOW (ref >60)
GFR calc non Af Amer: 23 mL/min — ABNORMAL LOW (ref >60)
Glucose, Bld: 121 mg/dL — ABNORMAL HIGH (ref 70–99)
Potassium: 3.9 mmol/L (ref 3.5–5.1)
Sodium: 143 mmol/L (ref 135–145)

## 2019-08-18 LAB — SODIUM, URINE, RANDOM: Sodium, Ur: 58 mmol/L

## 2019-08-18 LAB — ECHOCARDIOGRAM COMPLETE
Height: 62 in
Weight: 4032 oz

## 2019-08-18 LAB — GLUCOSE, CAPILLARY
Glucose-Capillary: 129 mg/dL — ABNORMAL HIGH (ref 70–99)
Glucose-Capillary: 155 mg/dL — ABNORMAL HIGH (ref 70–99)
Glucose-Capillary: 127 mg/dL — ABNORMAL HIGH (ref 70–99)

## 2019-08-18 LAB — CREATININE, URINE, RANDOM: Creatinine, Urine: 183.45 mg/dL

## 2019-08-18 MED ORDER — PERFLUTREN LIPID MICROSPHERE
1.0000 mL | INTRAVENOUS | Status: AC | PRN
  Administered 2019-08-18: 4 mL via INTRAVENOUS
  Filled 2019-08-18: qty 10

## 2019-08-18 NOTE — Consult Note (Addendum)
Cardiology Consultation:   Patient ID: [name] MRN: [id number]; DOB: [birth year]  Admit date: 08/16/2019 Date of Consult: 08/18/2019  Primary Care Provider: Luetta Nutting, DO Primary Cardiologist: Dr. Harrington Challenger   Patient Profile:   [name] is a 63 y.o. female with a hx of chronic diastolic CHF, PSVT, prior CVA, HTN, DM, OSA, COPD, pulmonary hypertension and CSF rhinorrhea s/p skull base reconstruction and going tobacco smoking who is being seen today for the evaluation of CHF at the request of Dr. Waldron Labs.   Hx of PSVT dating back to 2018. Converted to sinus on BB which discontinued 2nd to nausea. Placed on Verapamil. Then she had chest pain and dyspnea. Follow up stress testing was low risk but did demonstrate a possible small area of ischemia in the inferior wall. Given her ongoing symptoms, she was set up for right and left heart catheterization.  R/L Cardiac catheterization demonstrated minor nonobstructive CAD, mildly elevated LVEDP and moderate pulmonary hypertension.  Last seen by Nicki Reaper weaver 08/14/2017.  History of Present Illness:   [name] presented with worsening shortness of breath, orthopnea, PND, lower extremity edema and weight gain for past couple of weeks.  Her Lasix was discontinued on 06/27/2019 secondary to worsening kidney function per note however patient reported it was discontinued 3 weeks ago.  She is noncompliant with her medication, she was not taking all of her meds multiple days per week.  Questionable compliance with low-sodium diet.  No regular exercise.  Due to worsening symptoms she went to see her pulmonologist 9/8.  She also had early satiety and abdominal bloating.  Noted hypoxic at oxygen saturation of mid 80s.  Send to ER for further evaluation and admitted for acute CHF exacerbation with hypertensive urgency.   BNP 1427. TSH 4.7.   She was treated with IV Lasix 40 mg BID (got 4 doses so far) with 2.1 L diuresis.  However creatinine  worsen 1.72>>1.8>>2.21  Today. Her Scr was normal in June 2020. Held ARB today.  Her breathing has been improved.  Still volume overloaded by exam.  BP now stable at 134/66.   Heart Pathway Score:     Past Medical History:  Diagnosis Date  . Anxiety    doesn't take any meds for this  . Arteriosclerosis of coronary artery 08/31/2011   Myoview 9/12: EF 44, no ischemia // Myoview 8/18: EF 57, low risk, possible small area of inferior ischemia // Minor nonobstructive CAD by cardiac catheterization - [facility] 8/18: Proximal LAD 35, OM2 25  . Arthritis    knuckles  . Asthma   . Carotid artery disease ([place])    Carotid US 7/14: Bilateral ICA 40-59 // Carotid US ([facility]) 2/17: bilat plaque without sig stenosis  . Chronic diastolic CHF (congestive heart failure) ([place]) 07/26/2017   Echo 7/18:Severe conc LVH, EF 60-65, no RWMA, Gr 2 DD, MAC, mild MR // right and left heart cath 8/18: LVEDP 21  . Claustrophobia 07/26/2012  . COPD (chronic obstructive pulmonary disease) ([place])    "chronic bronchitis - about every winter"  . CSF leak    dizziness/headache - assoc symptoms // 2/2 encephalocele s/p skull base reconstruction  . Diverticulosis   . History of CVA (cerebrovascular accident) 03/2004   denies residual 07/26/2012  . Hx of gout   . Hyperlipidemia    takes Pravastatin daily  . Hypertensive heart disease with chronic diastolic congestive heart failure ([place]) 07/26/2017  . Internal hemorrhoids   . Kidney stones   . LVH (  left ventricular hypertrophy)    Echo 8/18: Severe conc LVH, EF 60-65, no RWMA, Gr 2 DD, MAC, mild MR  . OSA (obstructive sleep apnea) 07/26/2012   prior CPAP - stopped due to being primary caregiver for dying parent (stopped in 2016)  . PSVT (paroxysmal supraventricular tachycardia) ([place])   . Pulmonary hypertension, unspecified ([place]) 08/14/2017   Right heart cath 8/13:RVSP 66, PASP 57, mean PA 39, mean PWCP 18, LVEDP 21  . Type II diabetes mellitus ([place])     Past Surgical History:   Procedure Laterality Date  . ESOPHAGOGASTRODUODENOSCOPY (EGD) WITH PROPOFOL N/A 07/13/2015   Procedure: ESOPHAGOGASTRODUODENOSCOPY (EGD) WITH PROPOFOL;  Surgeon: Carol Ada, MD;  Location: [facility];  Service: Endoscopy;  Laterality: N/A;  . NASAL SINUS SURGERY  01/29/2012   Procedure: ENDOSCOPIC SINUS SURGERY WITH STEALTH;  Surgeon: Ruby Cola, MD;  Location: [facility];  Service: ENT;  Laterality: N/A;  . NM MYOVIEW LTD  08/25/2011   Low risk study, no evidence of ischemia, EF 44%  . REFRACTIVE SURGERY  ~ 2010   left  . RIGHT/LEFT HEART CATH AND CORONARY ANGIOGRAPHY N/A 07/30/2017   Procedure: RIGHT/LEFT HEART CATH AND CORONARY ANGIOGRAPHY;  Surgeon: Martinique, Peter M, MD;  Location: [facility];  Service: Cardiovascular;  Laterality: N/A;  . SPHENOIDECTOMY  01/29/2012   Procedure: SPHENOIDECTOMY;  Surgeon: Ruby Cola, MD;  Location: [facility];  Service: ENT;  Laterality: Left;  REPAIR OF LEFT SPHENOID    . TOTAL ABDOMINAL HYSTERECTOMY  1994     Inpatient Medications: Scheduled Meds: . allopurinol  100 mg Oral Daily  . aspirin EC  81 mg Oral Daily  . busPIRone  15 mg Oral BID  . enoxaparin (LOVENOX) injection  40 mg Subcutaneous Q24H  . FLUoxetine  20 mg Oral QHS  . gabapentin  400 mg Oral TID  . insulin aspart  0-9 Units Subcutaneous TID WC  . insulin aspart  20 Units Subcutaneous TID WC  . insulin glargine  120 Units Subcutaneous Daily  . isosorbide mononitrate  60 mg Oral Daily  . nicotine  21 mg Transdermal Daily  . pantoprazole  40 mg Oral Daily  . potassium chloride SA  20 mEq Oral Daily  . rosuvastatin  20 mg Oral Daily  . topiramate  25 mg Oral BID  . verapamil  300 mg Oral Daily   Continuous Infusions:  PRN Meds: acetaminophen **OR** acetaminophen, albuterol, hydrALAZINE, nitroGLYCERIN, ondansetron **OR** ondansetron (ZOFRAN) IV  Allergies:    Allergies  Allergen Reactions  . Atorvastatin Other (See Comments) and Nausea And Vomiting    "legs cramped; moderate  to almost severe" "legs cramped; moderate to almost severe"  . Ibuprofen Other (See Comments)    Other reaction(s): Other (See Comments) Per pt, MD doesn't want pt to take Per pt, MD doesn't want pt to take  . Metformin And Related Nausea And Vomiting  . Ace Inhibitors Cough and Nausea And Vomiting  . Hydrocodone-Acetaminophen Nausea And Vomiting  . Albuterol Other (See Comments)    Caused SVT    Social History:   Social History   Socioeconomic History  . Marital status: Single    Spouse name: Not on file  . Number of children: 0  . Years of education: Not on file  . Highest education level: Not on file  Occupational History  . Occupation: disabled  Social Needs  . Financial resource strain: Not on file  . Food insecurity    Worry: Not on file  Inability: Not on file  . Transportation needs    Medical: Not on file    Non-medical: Not on file  Tobacco Use  . Smoking status: Current Every Day Smoker    Packs/day: 0.50    Years: 35.00    Pack years: 17.50    Types: Cigarettes  . Smokeless tobacco: Never Used  . Tobacco comment: 07/26/2012 has decreased smoking from 2ppd; offered cessation materials; pt declines  Substance and Sexual Activity  . Alcohol use: Yes    Alcohol/week: 1.0 standard drinks    Types: 1 Glasses of wine per week    Comment: rarely  . Drug use: No  . Sexual activity: Never    Birth control/protection: Surgical  Lifestyle  . Physical activity    Days per week: Not on file    Minutes per session: Not on file  . Stress: Not on file  Relationships  . Social Herbalist on phone: Not on file    Gets together: Not on file    Attends religious service: Not on file    Active member of club or organization: Not on file    Attends meetings of clubs or organizations: Not on file    Relationship status: Not on file  . Intimate partner violence    Fear of current or ex partner: Not on file    Emotionally abused: Not on file    Physically  abused: Not on file    Forced sexual activity: Not on file  Other Topics Concern  . Not on file  Social History Narrative   Currently unemployed.  Really wants to find a job, but has been difficult for her.  No children, not married    Family History:   Family History  Problem Relation Age of Onset  . Colon cancer Sister 62  . Breast cancer Mother 43  . Alzheimer's disease Mother   . Diabetes Sister 38  . Heart failure Sister   . Diabetes Sister 63  . Lung cancer Father   . Breast cancer Sister   . Cancer Sister        mouth  . Anesthesia problems Neg Hx   . Hypotension Neg Hx   . Malignant hyperthermia Neg Hx   . Pseudochol deficiency Neg Hx      ROS:  Please see the history of present illness.  All other ROS reviewed and negative.     Physical Exam/Data:   Vitals:   08/17/19 2354 08/18/19 0533 08/18/19 0806 08/18/19 1132  BP: (!) 107/56 133/70 130/63 134/66  Pulse: 66 60 (!) 58 62  Resp: _0 Temp: 97.7 F (36.5 C)  (!) 97.4 F (36.3 C) 97.6 F (36.4 C)  TempSrc: Oral  Oral Oral  SpO2: 93% 94% 94% 95%  Weight:  114.3 kg    Height:        Intake/Output Summary (Last 24 hours) at 08/18/2019 1317 Last data filed at 08/18/2019 1152 Gross per 24 hour  Intake 660 ml  Output 500 ml  Net 160 ml   Last 3 Weights 08/18/2019 08/17/2019 08/16/2019  Weight (lbs) 252 lb 255 lb 3.2 oz 255 lb 11.7 oz  Weight (kg) 114.306 kg 115.758 kg 116 kg     Body mass index is 46.09 kg/m.  General: Obese female in no acute distress HEENT: normal Lymph: no adenopathy Neck: no JVD Endocrine:  No thryomegaly Vascular: No carotid bruits; FA pulses 2+ bilaterally without bruits  Cardiac:  normal S1, S2; RRR; no murmur  Lungs: Diminished breath sounds Abd: soft, nontender, no hepatomegaly, distended Ext: 2+ bilateral lower extremity edema Musculoskeletal:  No deformities, BUE and BLE strength normal and equal Skin: warm and dry  Neuro:  CNs 2-12 intact, no focal abnormalities  noted Psych:  Normal affect   EKG:  The EKG was personally reviewed and demonstrates:  Sinus rhythm at rate of 95 bpm Telemetry:  Telemetry was personally reviewed and demonstrates:  Sinus rhythm at controlled rate, brief atrial tachycardia/SVT  Relevant CV Studies:  Echo 07/03/2017 Study Conclusions  - Left ventricle: The cavity size was normal. There was severe   concentric hypertrophy. Systolic function was normal. The   estimated ejection fraction was in the range of 60% to 65%. Wall   motion was normal; there were no regional wall motion   abnormalities. Features are consistent with a pseudonormal left   ventricular filling pattern, with concomitant abnormal relaxation   and increased filling pressure (grade 2 diastolic dysfunction).   Doppler parameters are consistent with high ventricular filling   pressure. - Aortic valve: Trileaflet; normal thickness, mildly calcified   leaflets. - Mitral valve: Calcified annulus. There was mild regurgitation   directed eccentrically. - Pulmonary arteries: Systolic pressure could not be accurately   estimated.   RIGHT/LEFT HEART CATH AND CORONARY ANGIOGRAPHY  07/30/2017  Conclusion    Prox LAD lesion, 35 %stenosed.  Ost 2nd Mrg to 2nd Mrg lesion, 25 %stenosed.  LV end diastolic pressure is mildly elevated.  Hemodynamic findings consistent with moderate pulmonary hypertension.   1. Minor nonobstructive CAD 2. Mildly elevated LVEDP 3. Moderate pulmonary HTN.  Plan: medical management. Weight loss and optimal therapy for OSA.     Fick Cardiac Output 3.68 L/min  Fick Cardiac Output Index 1.74 (L/min)/BSA  RA A Wave 15 mmHg  RA V Wave 0 mmHg  RA Mean 10 mmHg  RV Systolic Pressure 66 mmHg  RV Diastolic Pressure 3 mmHg  RV EDP 13 mmHg  PA Systolic Pressure 57 mmHg  PA Diastolic Pressure 22 mmHg  PA Mean 39 mmHg  PW A Wave 19 mmHg  PW V Wave 19 mmHg  PW Mean 18 mmHg  AO Systolic Pressure 224 mmHg  AO Diastolic  Pressure 82 mmHg  AO Mean 825 mmHg  LV Systolic Pressure 003 mmHg  LV Diastolic Pressure 11 mmHg  LV EDP 21 mmHg  AOp Systolic Pressure 704 mmHg  AOp Diastolic Pressure 80 mmHg  AOp Mean Pressure 888 mmHg  LVp Systolic Pressure 916 mmHg  LVp Diastolic Pressure 13 mmHg  QP/QS 1  TPVR Index 22.34 HRUI  TSVR Index 65.28 HRUI  PVR SVR Ratio 0.2  TPVR/TSVR Ratio 0.34     Laboratory Data:  High Sensitivity Troponin:   Recent Labs  Lab 08/16/19 1745 08/16/19 2311  TROPONINIHS 23* 29*     Chemistry Recent Labs  Lab 08/16/19 1745 08/17/19 0510 08/18/19 0537  NA 144 143 143  K 4.0 3.3* 3.9  CL 103 101 100  CO2 30 28 32  GLUCOSE 182* 165* 121*  BUN _0 CREATININE 1.72* 1.80* 2.21*  CALCIUM 9.3 9.2 8.6*  GFRNONAA 31* 30* 23*  GFRAA 36* 34* 27*  ANIONGAP _1 Recent Labs  Lab 08/17/19 0510  PROT 6.4*  ALBUMIN 3.6  AST 20  ALT 12  ALKPHOS 88  BILITOT 1.7*   Hematology Recent Labs  Lab 08/16/19 1745 08/17/19 0510  WBC 7.4 7.6  RBC 5.65* 5.64*  HGB 16.4* 16.2*  HCT 53.2* 52.3*  MCV 94.2 92.7  MCH 29.0 28.7  MCHC 30.8 31.0  RDW 16.7* 16.5*  PLT 184 184   BNP Recent Labs  Lab 08/17/19 0457  BNP 1,427.6*     Radiology/Studies:  Dg Chest 2 View  Result Date: 08/16/2019 CLINICAL DATA:  63 year old female with chest pain and shortness of breath. EXAM: CHEST - 2 VIEW COMPARISON:  Chest radiograph dated 01/05/2019 FINDINGS: Small left pleural effusion and left lung base atelectasis versus infiltrate. Overall slight interval increase in the size of the left pleural effusion and associated left lung base opacity compared to the prior radiograph. The right lung remains clear. No pneumothorax. Stable cardiac silhouette. No acute osseous pathology. Degenerative changes of the spine. IMPRESSION: Small left pleural effusion and left lung base atelectasis versus infiltrate, increased in size since the prior radiograph. Electronically Signed   By: Anner Crete M.D.   On: 08/16/2019 19:01    Assessment and Plan:   1. Acute on chronic diastolic heart failure -BNP 1427 on admit.  She was treated with IV Lasix 40 mg twice daily.  Diuresis 2.1 L.  However her kidney function worsened, 1.72>>1.8>>2.21. Weight down 3lb to 252lb. Baseline weight around 225lb. Still volume overloaded by exam. Place compression stocking. Will update echo.  - Concern for obesity hypoventilation syndorme   2.  Acute on chronic kidney disease stage III -She had normal renal function which June 2020.  Follow closely with diuresis. - Follows Dr.Upton  3 pulmonary hypertension -Moderate by cardiac catheterization in 2018.  Seems  secondary to her obesity, obstructive sleep apnea and chronic diastolic heart failure. - Will repeat echo  4.  Hypertensive urgency in setting of noncompliance with medication -Blood pressure normalized on current medication  5.  Diabetes mellitus - Per primary team  6. PSVT - Intermittent palpitations. Continue varapamil   7. Tobacco abuse - needs to quit   For questions or updates, please contact [place] Please consult [email or web address] for contact info under     SignedLeanor Kail, PA  08/18/2019 1:17 PM   History and all data above reviewed.  Patient examined.  I agree with the findings as above.  The patient presents with shortness of breath.  This was progressive to the point where she was getting short of breath lying in bed at night.  She was short of breath with minimal activity.  She had increased leg swelling and abdominal girth.  Her weights have gone up.  She has had sharp shooting chest pains.  She reports that she has been told to stop taking her Lasix for a few days and she has been off of it for several days.  She came in with hypertensive urgency and respiratory distress.  She does not follow a low-salt diet.  She reports she eats out quite a bit.  Of note her creatinine is increased as she has been  treated with IV diuretics.  The patient exam reveals COR:RRR  ,  Lungs: Decreased breath sounds  ,  Abd: Obese, Ext Moderate edema  .  All available labs, radiology testing, previous records reviewed. Agree with documented assessment and plan.   Acute on chronic diastolic HF: She probably has some element of hypoventilation obesity syndrome.  She is had some element of pulmonary hypertension in the past.  I suspect that she probably has some RV dysfunction.  This would make diuresis more difficult.  She needs to keep  her feet elevated.  Were going to apply compression stockings.  She needs a repeat echocardiogram.  Further evaluation will based on the results of the echo.  We will hold off on her PM dose of Lasix  SLEEP APNEA: She will need another sleep study and continue treatment of this.   MORBID OBESITY:  Continue education.  TOBACCO ABUSE:  She apparently has a new prescription for Chantix.   Jeneen Rinks Bryler Dibble  5:04 PM  08/18/2019

## 2019-08-18 NOTE — Progress Notes (Signed)
Echocardiogram 2D Echocardiogram has been performed.  Oneal Deputy Marimar Suber 08/18/2019, 3:46 PM

## 2019-08-18 NOTE — Progress Notes (Signed)
PROGRESS NOTE                                                                                                                                                                                                             Patient Demographics:    [name], is a 63 y.o. female, DOB - 1956-11-19, MBW:[id number]  Admit date - 08/16/2019   Admitting Physician Rise Patience, MD  Outpatient Primary MD for the patient is Luetta Nutting, DO  LOS - 2   Chief Complaint  Patient presents with  . Shortness of Breath  . Chest Pain       Brief Narrative    63 y.o. female with history of chronic diastolic CHF, hypertension, chronic kidney disease, pulmonary hypertension diabetes mellitus, CVA, CSF rhinorrhea status post reconstruction surgery sleep apnea and ongoing tobacco abuse presents to the ER because of increasing shortness of breath over the last 3 weeks, work-up significant for volume overload, admitted for IV diuresis.   Subjective:    [name] today reports some dyspnea, generalized weakness, denies fever, chills or chest pain .    Assessment  & Plan :    Principal Problem:   Acute diastolic CHF (congestive heart failure) (HCC) Active Problems:   Essential hypertension   Morbid obesity due to excess calories (HCC)   HLD (hyperlipidemia)   OSA (obstructive sleep apnea)   COPD  GOLD 0 still smoking   Pulmonary hypertension, unspecified (HCC)   Type 2 diabetes mellitus with stage 3 chronic kidney disease, with long-term current use of insulin (HCC)  Acute on chronic diastolic CHF  -EF was 60 to 65% with grade 2 diastolic dysfunction in 0177 . -Patient significantly volume overloaded on physical exam, with elevated proBNP, she was on IV Lasix 40 mg IV twice daily, with good response, she is -2.1 L over last 24 hours, but her creatinine significantly worsened, went from 1.7 on admission to 2.2 today , so I have held her Lasix, and  requested cardiology input.  AKI on Chronic kidney disease stage III  -Baseline creatinine around 1.5, trending up with diuresis, it is 2.2 today . -Avoid nephrotoxic medication  - following with Dr. Hollie Salk .  Hypertensive urgency  -patient blood pressure  elevated in ED. -Blood pressure acceptable on current regimen  Diabetes mellitus type 2 -CBG acceptable on  current regimen, of Lantus 120 subcu daily, and pre-meal NovoLog, and insulin sliding scale.  Sleep apnea on CPAP.  COPD  - not actively wheezing continue inhalers.   Tobacco abuse  - advised about quitting.  Hyperlipidemia on statins.  History of CVA  - on statins and antiplatelet agents.  History of gout  - on allopurinol.  History of depression and anxiety  - on Prozac and BuSpar.   COVID-19 Labs  No results for input(s): DDIMER, FERRITIN, LDH, CRP in the last 72 hours.  Lab Results  Component Value Date   [place] NEGATIVE 08/17/2019     Code Status : Full code  Family Communication  : Discussed with patient  Disposition Plan  : Home once stable  Barriers For Discharge : Remains significantly dyspneic on physical exam, significant volume overload, in need of IV diuresis with close renal function monitoring given worsening creatinine  Consults  : None  Procedures  : None  DVT Prophylaxis  : Subcu Lovenox  Lab Results  Component Value Date   PLT 184 08/17/2019    Antibiotics  :    Anti-infectives (From admission, onward)   None        Objective:   Vitals:   08/17/19 2354 08/18/19 0533 08/18/19 0806 08/18/19 1132  BP: (!) 107/56 133/70 130/63 134/66  Pulse: 66 60 (!) 58 62  Resp: 17 17 20 20   Temp: 97.7 F (36.5 C)  (!) 97.4 F (36.3 C) 97.6 F (36.4 C)  TempSrc: Oral  Oral Oral  SpO2: 93% 94% 94% 95%  Weight:  114.3 kg    Height:        Wt Readings from Last 3 Encounters:  08/18/19 114.3 kg  08/16/19 116.6 kg  07/04/19 113.3 kg     Intake/Output Summary (Last 24  hours) at 08/18/2019 1412 Last data filed at 08/18/2019 1152 Gross per 24 hour  Intake 660 ml  Output 500 ml  Net 160 ml     Physical Exam  Awake Alert, Oriented X 3, No new F.N deficits, Normal affect Symmetrical Chest wall movement, bibasilar crackles  RRR,No Gallops,Rubs or new Murmurs, No Parasternal Heave +ve B.Sounds, Abd Soft, No tenderness, No rebound - guarding or rigidity. No Cyanosis, Clubbing ,+1  edema, No new Rash or bruise       Data Review:    CBC Recent Labs  Lab 08/16/19 1745 08/17/19 0510  WBC 7.4 7.6  HGB 16.4* 16.2*  HCT 53.2* 52.3*  PLT 184 184  MCV 94.2 92.7  MCH 29.0 28.7  MCHC 30.8 31.0  RDW 16.7* 16.5*  LYMPHSABS  --  1.8  MONOABS  --  0.4  EOSABS  --  0.2  BASOSABS  --  0.0    Chemistries  Recent Labs  Lab 08/16/19 1745 08/17/19 0510 08/18/19 0537  NA 144 143 143  K 4.0 3.3* 3.9  CL 103 101 100  CO2 30 28 32  GLUCOSE 182* 165* 121*  BUN 18 19 20   CREATININE 1.72* 1.80* 2.21*  CALCIUM 9.3 9.2 8.6*  MG  --  1.7  --   AST  --  20  --   ALT  --  12  --   ALKPHOS  --  88  --   BILITOT  --  1.7*  --    ------------------------------------------------------------------------------------------------------------------ No results for input(s): CHOL, HDL, LDLCALC, TRIG, CHOLHDL, LDLDIRECT in the last 72 hours.  Lab Results  Component Value Date   HGBA1C 8.2 (H) 05/23/2019   ------------------------------------------------------------------------------------------------------------------  Recent Labs    08/17/19 0457  TSH 4.742*   ------------------------------------------------------------------------------------------------------------------ No results for input(s): VITAMINB12, FOLATE, FERRITIN, TIBC, IRON, RETICCTPCT in the last 72 hours.  Coagulation profile No results for input(s): INR, PROTIME in the last 168 hours.  No results for input(s): DDIMER in the last 72 hours.  Cardiac Enzymes No results for input(s): CKMB,  TROPONINI, MYOGLOBIN in the last 168 hours.  Invalid input(s): CK ------------------------------------------------------------------------------------------------------------------    Component Value Date/Time   BNP 1,427.6 (H) 08/17/2019 0457   BNP 43.7 11/07/2011 1436    Inpatient Medications  Scheduled Meds: . allopurinol  100 mg Oral Daily  . aspirin EC  81 mg Oral Daily  . busPIRone  15 mg Oral BID  . enoxaparin (LOVENOX) injection  40 mg Subcutaneous Q24H  . FLUoxetine  20 mg Oral QHS  . gabapentin  400 mg Oral TID  . insulin aspart  0-9 Units Subcutaneous TID WC  . insulin aspart  20 Units Subcutaneous TID WC  . insulin glargine  120 Units Subcutaneous Daily  . isosorbide mononitrate  60 mg Oral Daily  . nicotine  21 mg Transdermal Daily  . pantoprazole  40 mg Oral Daily  . potassium chloride SA  20 mEq Oral Daily  . rosuvastatin  20 mg Oral Daily  . topiramate  25 mg Oral BID  . verapamil  300 mg Oral Daily   Continuous Infusions: PRN Meds:.acetaminophen **OR** acetaminophen, albuterol, hydrALAZINE, nitroGLYCERIN, ondansetron **OR** ondansetron (ZOFRAN) IV  Micro Results Recent Results (from the past 240 hour(s))  SARS Coronavirus 2 [facility] order, Performed in [facility] hospital lab) Nasopharyngeal Nasopharyngeal Swab     Status: None   Collection Time: 08/17/19 12:38 AM   Specimen: Nasopharyngeal Swab  Result Value Ref Range Status   SARS Coronavirus 2 NEGATIVE NEGATIVE Final    Comment: (NOTE) If result is NEGATIVE SARS-CoV-2 target nucleic acids are NOT DETECTED. The SARS-CoV-2 RNA is generally detectable in upper and lower  respiratory specimens during the acute phase of infection. The lowest  concentration of SARS-CoV-2 viral copies this assay can detect is 250  copies / mL. A negative result does not preclude SARS-CoV-2 infection  and should not be used as the sole basis for treatment or other  patient management decisions.  A negative result may  occur with  improper specimen collection / handling, submission of specimen other  than nasopharyngeal swab, presence of viral mutation(s) within the  areas targeted by this assay, and inadequate number of viral copies  (<250 copies / mL). A negative result must be combined with clinical  observations, patient history, and epidemiological information. If result is POSITIVE SARS-CoV-2 target nucleic acids are DETECTED. The SARS-CoV-2 RNA is generally detectable in upper and lower  respiratory specimens dur ing the acute phase of infection.  Positive  results are indicative of active infection with SARS-CoV-2.  Clinical  correlation with patient history and other diagnostic information is  necessary to determine patient infection status.  Positive results do  not rule out bacterial infection or co-infection with other viruses. If result is PRESUMPTIVE POSTIVE SARS-CoV-2 nucleic acids MAY BE PRESENT.   A presumptive positive result was obtained on the submitted specimen  and confirmed on repeat testing.  While 2019 novel coronavirus  (SARS-CoV-2) nucleic acids may be present in the submitted sample  additional confirmatory testing may be necessary for epidemiological  and / or clinical management purposes  to differentiate between  SARS-CoV-2 and other Sarbecovirus currently known to infect  humans.  If clinically indicated additional testing with an alternate test  methodology [phone]) is advised. The SARS-CoV-2 RNA is generally  detectable in upper and lower respiratory sp ecimens during the acute  phase of infection. The expected result is Negative. Fact Sheet for Patients:  StrictlyIdeas.no Fact Sheet for Healthcare Providers: BankingDealers.co.za This test is not yet approved or cleared by the [place] FDA and has been authorized for detection and/or diagnosis of SARS-CoV-2 by FDA under an Emergency Use Authorization (EUA).  This  EUA will remain in effect (meaning this test can be used) for the duration of the COVID-19 declaration under Section 564(b)(1) of the Act, 21 U.S.C. section 360bbb-3(b)(1), unless the authorization is terminated or revoked sooner. Performed at [facility], [place] [address] [place], [place] [address]     Radiology Reports Dg Chest 2 View  Result Date: 08/16/2019 CLINICAL DATA:  63 year old female with chest pain and shortness of breath. EXAM: CHEST - 2 VIEW COMPARISON:  Chest radiograph dated 01/05/2019 FINDINGS: Small left pleural effusion and left lung base atelectasis versus infiltrate. Overall slight interval increase in the size of the left pleural effusion and associated left lung base opacity compared to the prior radiograph. The right lung remains clear. No pneumothorax. Stable cardiac silhouette. No acute osseous pathology. Degenerative changes of the spine. IMPRESSION: Small left pleural effusion and left lung base atelectasis versus infiltrate, increased in size since the prior radiograph. Electronically Signed   By: Anner Crete M.D.   On: 08/16/2019 19:01     Phillips Climes M.D on 08/18/2019 at 2:12 PM  Between 7am to 7pm - Pager - [phone]  After 7pm go to [email or web address] - password [facility]  [facility] -  Office  [phone]

## 2019-08-19 LAB — GLUCOSE, CAPILLARY
Glucose-Capillary: 212 mg/dL — ABNORMAL HIGH (ref 70–99)
Glucose-Capillary: 157 mg/dL — ABNORMAL HIGH (ref 70–99)
Glucose-Capillary: 51 mg/dL — ABNORMAL LOW (ref 70–99)
Glucose-Capillary: 122 mg/dL — ABNORMAL HIGH (ref 70–99)
Glucose-Capillary: 112 mg/dL — ABNORMAL HIGH (ref 70–99)
Glucose-Capillary: 83 mg/dL (ref 70–99)

## 2019-08-19 LAB — BASIC METABOLIC PANEL
Anion gap: 11 (ref 5–15)
BUN: 25 mg/dL — ABNORMAL HIGH (ref 8–23)
CO2: 29 mmol/L (ref 22–32)
Calcium: 8.2 mg/dL — ABNORMAL LOW (ref 8.9–10.3)
Chloride: 103 mmol/L (ref 98–111)
Creatinine, Ser: 2.64 mg/dL — ABNORMAL HIGH (ref 0.44–1.00)
GFR calc Af Amer: 22 mL/min — ABNORMAL LOW (ref >60)
GFR calc non Af Amer: 19 mL/min — ABNORMAL LOW (ref >60)
Glucose, Bld: 157 mg/dL — ABNORMAL HIGH (ref 70–99)
Potassium: 4 mmol/L (ref 3.5–5.1)
Sodium: 143 mmol/L (ref 135–145)

## 2019-08-19 MED ORDER — SODIUM CHLORIDE 0.9 % IV SOLN
1.0000 g | INTRAVENOUS | Status: DC
  Administered 2019-08-19 – 2019-08-21 (×3): 1 g via INTRAVENOUS
  Filled 2019-08-19: qty 1
  Filled 2019-08-19 (×2): qty 10

## 2019-08-19 MED ORDER — INSULIN GLARGINE 100 UNIT/ML ~~LOC~~ SOLN
60.0000 [IU] | Freq: Every day | SUBCUTANEOUS | Status: DC
  Administered 2019-08-20 – 2019-08-26 (×7): 60 [IU] via SUBCUTANEOUS
  Filled 2019-08-19 (×7): qty 0.6

## 2019-08-19 MED ORDER — ENOXAPARIN SODIUM 30 MG/0.3ML ~~LOC~~ SOLN
30.0000 mg | SUBCUTANEOUS | Status: DC
  Administered 2019-08-19 – 2019-08-22 (×4): 30 mg via SUBCUTANEOUS
  Filled 2019-08-19 (×4): qty 0.3

## 2019-08-19 NOTE — Plan of Care (Signed)
  Problem: Education: Goal: Knowledge of General Education information will improve Description Including pain rating scale, medication(s)/side effects and non-pharmacologic comfort measures Outcome: Progressing   

## 2019-08-19 NOTE — Consult Note (Signed)
[facility]    NEPHROLOGY CONSULTATION NOTE  PATIENT ID:  [name], DOB:  Aug 09, 1956  HPI: The patient is a 63 y.o. year old female patient with a past medical history significant for chronic diastolic CHF, hypertension, chronic kidney disease stage III, pulmonary hypertension, diabetes, CVA, CSF rhinorrhea status post reconstructive surgery for sleep apnea and ongoing tobacco use who presented to the emergency department for increased shortness of breath for 3 weeks.  She reports that her Lasix as an outpatient was discontinued for worsening creatinine.  She had a subsequent increase in her weight.  She was given IV diuretics, and her serum creatinine worsened prompting renal consultation.  She reports today feeling much improved although she still has some dyspnea on exertion.  She reports improvement in her lower extremity edema.  She denies any associated chest pain.   Past Medical History:  Diagnosis Date  . Anxiety    doesn't take any meds for this  . Arteriosclerosis of coronary artery 08/31/2011   Myoview 9/12: EF 44, no ischemia // Myoview 8/18: EF 57, low risk, possible small area of inferior ischemia // Minor nonobstructive CAD by cardiac catheterization - LHC 8/18: Proximal LAD 35, OM2 25  . Arthritis    knuckles  . Asthma   . Carotid artery disease ([place])    Carotid US 7/14: Bilateral ICA 40-59 // Carotid US ([facility]) 2/17: bilat plaque without sig stenosis  . Chronic diastolic CHF (congestive heart failure) ([place]) 07/26/2017   Echo 7/18:Severe conc LVH, EF 60-65, no RWMA, Gr 2 DD, MAC, mild MR // right and left heart cath 8/18: LVEDP 21  . Claustrophobia 07/26/2012  . COPD (chronic obstructive pulmonary disease) ([place])    "chronic bronchitis - about every winter"  . CSF leak    dizziness/headache - assoc symptoms // 2/2 encephalocele s/p skull base reconstruction  . Diverticulosis   . History of CVA (cerebrovascular accident) 03/2004   denies residual  07/26/2012  . Hx of gout   . Hyperlipidemia    takes Pravastatin daily  . Hypertensive heart disease with chronic diastolic congestive heart failure ([place]) 07/26/2017  . Internal hemorrhoids   . Kidney stones   . LVH (left ventricular hypertrophy)    Echo 8/18: Severe conc LVH, EF 60-65, no RWMA, Gr 2 DD, MAC, mild MR  . OSA (obstructive sleep apnea) 07/26/2012   prior CPAP - stopped due to being primary caregiver for dying parent (stopped in 2016)  . PSVT (paroxysmal supraventricular tachycardia) ([place])   . Pulmonary hypertension, unspecified ([place]) 08/14/2017   cath 07/2017 >>PASP 23m Hg, PA mean 39 mm Hg  . Type II diabetes mellitus ([place]     Past Surgical History:  Procedure Laterality Date  . ESOPHAGOGASTRODUODENOSCOPY (EGD) WITH PROPOFOL N/A 07/13/2015   Procedure: ESOPHAGOGASTRODUODENOSCOPY (EGD) WITH PROPOFOL;  Surgeon: PCarol Ada MD;  Location: [facility];  Service: Endoscopy;  Laterality: N/A;  . NASAL SINUS SURGERY  01/29/2012   Procedure: ENDOSCOPIC SINUS SURGERY WITH STEALTH;  Surgeon: MRuby Cola MD;  [place] [facility]  Service: ENT;  Laterality: N/A;  . NM MYOVIEW LTD  08/25/2011   Low risk study, no evidence of ischemia, EF 44%  . REFRACTIVE SURGERY  ~ 2010   left  . RIGHT/LEFT HEART CATH AND CORONARY ANGIOGRAPHY N/A 07/30/2017   Procedure: RIGHT/LEFT HEART CATH AND CORONARY ANGIOGRAPHY;  Surgeon: JMartinique Peter M, MD;  [facility];  Service: Cardiovascular;  Laterality: N/A;  . SPHENOIDECTOMY  01/29/2012  Procedure: SPHENOIDECTOMY;  Surgeon: Ruby Cola, MD;  Location: [place];  Service: ENT;  Laterality: Left;  REPAIR OF LEFT SPHENOID    . TOTAL ABDOMINAL HYSTERECTOMY  1994    Family History  Problem Relation Age of Onset  . Colon cancer Sister 65  . Breast cancer Mother 5  . Alzheimer's disease Mother   . Diabetes Sister 44  . Heart failure Sister   . Diabetes Sister 54  . Lung cancer Father   . Breast cancer Sister   . Cancer Sister         mouth  . Anesthesia problems Neg Hx   . Hypotension Neg Hx   . Malignant hyperthermia Neg Hx   . Pseudochol deficiency Neg Hx     Social History   Tobacco Use  . Smoking status: Current Every Day Smoker    Packs/day: 0.50    Years: 35.00    Pack years: 17.50    Types: Cigarettes  . Smokeless tobacco: Never Used  . Tobacco comment: 07/26/2012 has decreased smoking from 2ppd; offered cessation materials; pt declines  Substance Use Topics  . Alcohol use: Yes    Alcohol/week: 1.0 standard drinks    Types: 1 Glasses of wine per week    Comment: rarely  . Drug use: No    REVIEW OF SYSTEMS: General:  no fatigue, no weakness Head:  no headaches Eyes:  no blurred vision ENT:  no sore throat Neck:  no masses CV:  no chest pain, positive orthopnea, positive lower extremity edema Lungs: Positive dyspnea on exertion, no cough GI:  no nausea or vomiting, no diarrhea GU:  no dysuria or hematuria Skin:  no rashes or lesions Neuro:  no focal numbness or weakness Psych:  no depression or anxiety    PHYSICAL EXAM:  Vitals:   08/19/19 1542 08/19/19 1556  BP: (!) 81/48 98/78  Pulse: 61 63  Resp:    Temp:    SpO2: 93%    I/O last 3 completed shifts: In: 1260 [P.O.:1260] Out: 825 [Urine:825]   General:  AAOx3 NAD HEENT: MMM [place] AT anicteric sclera Neck:  No JVD, no adenopathy CV:  Heart RRR  Lungs:  L/S CTA bilaterally Abd:  abd SNT/ND with normal BS GU:  Bladder non-palpable Extremities: +2 bilateral lower extremity edema Skin:  No skin rash Psych:  normal mood and affect Neuro:  no focal deficits   CURRENT MEDICATIONS:  . allopurinol  100 mg Oral Daily  . aspirin EC  81 mg Oral Daily  . busPIRone  15 mg Oral BID  . enoxaparin (LOVENOX) injection  30 mg Subcutaneous Q24H  . FLUoxetine  20 mg Oral QHS  . gabapentin  400 mg Oral TID  . insulin aspart  0-9 Units Subcutaneous TID WC  . [START ON 08/20/2019] insulin glargine  60 Units Subcutaneous Daily  . isosorbide  mononitrate  60 mg Oral Daily  . nicotine  21 mg Transdermal Daily  . pantoprazole  40 mg Oral Daily  . potassium chloride SA  20 mEq Oral Daily  . rosuvastatin  20 mg Oral Daily  . topiramate  25 mg Oral BID     HOME MEDICATIONS:  Prior to Admission medications   Medication Sig Start Date End Date Taking? Authorizing Provider  acetaminophen (TYLENOL) 325 MG tablet Take 2 tablets (650 mg total) by mouth every 6 (six) hours as needed for mild pain. 06/30/18  Yes Luetta Nutting, DO  albuterol (VENTOLIN HFA) 108 (90 Base) MCG/ACT  inhaler Inhale 2 puffs into the lungs every 6 (six) hours as needed for wheezing or shortness of breath. 04/21/19  Yes Luetta Nutting, DO  allopurinol (ZYLOPRIM) 100 MG tablet Take 100 mg by mouth daily.  06/24/18  Yes [provider]  aspirin EC 81 MG tablet Take 1 tablet (81 mg total) by mouth daily. 07/13/17  Yes Weaver, Scott T, PA-C  busPIRone (BUSPAR) 15 MG tablet Take 15 mg by mouth 2 (two) times daily.  06/17/16  Yes [provider]  FLUoxetine (PROZAC) 20 MG capsule TAKE 1 CAPSULE (20 MG TOTAL) BY MOUTH AT BEDTIME. 07/31/19 08/30/19 Yes Matthews, Cody, DO  gabapentin (NEURONTIN) 400 MG capsule TAKE 1 CAPSULE BY MOUTH THREE TIMES A DAY Patient taking differently: Take 400 mg by mouth 3 (three) times daily.  04/25/19  Yes Luetta Nutting, DO  insulin aspart (NOVOLOG FLEXPEN) 100 UNIT/ML FlexPen Inject 30-40 Units into the skin 3 (three) times daily with meals. 07/04/19  Yes Luetta Nutting, DO  Insulin Glargine (TOUJEO SOLOSTAR) 300 UNIT/ML SOPN Inject 110 Units into the skin 2 (two) times daily. Patient taking differently: Inject 120 Units into the skin daily.  03/31/18  Yes Luetta Nutting, DO  isosorbide mononitrate (IMDUR) 60 MG 24 hr tablet TAKE 1 TABLET (60 MG TOTAL) BY MOUTH DAILY FOR 30 DAYS. ** DO NOT CRUSH ** 07/18/19 08/17/19 Yes Luetta Nutting, DO  nitroGLYCERIN (NITROSTAT) 0.4 MG SL tablet Place 1 tablet (0.4 mg total) under the tongue every 5  (five) minutes as needed. Patient taking differently: Place 0.4 mg under the tongue every 5 (five) minutes as needed for chest pain.  07/27/17  Yes Weaver, Scott T, PA-C  omeprazole (PRILOSEC) 40 MG capsule Take 30- 60 min before your first and last meals of the day Patient taking differently: Take 40 mg by mouth 2 (two) times daily.  01/13/18  Yes Tanda [place], MD  potassium chloride SA (KLOR-CON M20) 20 MEQ tablet Take 1 tablet (20 mEq total) by mouth daily. Please make overdue appt with Dr. Harrington Challenger before anymore refills. 1st attempt 10/12/18  Yes Weaver, Scott T, PA-C  rosuvastatin (CRESTOR) 20 MG tablet Take 1 tablet (20 mg total) by mouth daily. 07/04/19 10/02/19 Yes Luetta Nutting, DO  topiramate (TOPAMAX) 25 MG tablet Take 1 tablet (25 mg total) by mouth 2 (two) times daily. 07/04/19  Yes Luetta Nutting, DO  valsartan (DIOVAN) 40 MG tablet Take 40 mg by mouth daily.  02/23/19  Yes [provider]  Blood Glucose Monitoring Suppl (ACCU-CHEK AVIVA PLUS) w/Device KIT Patient is to test four times daily. Dx. A63.01 11/03/18   Luetta Nutting, DO  glucose blood [facility] VERIO) test strip For E11.22 07/11/19   Luetta Nutting, DO  Insulin Glargine, 1 Unit Dial, (TOUJEO SOLOSTAR) 300 UNIT/ML SOPN Inject 120 Units into the skin daily. Patient not taking: Reported on 08/16/2019 07/04/19 10/02/19  Luetta Nutting, DO  varenicline (CHANTIX STARTING MONTH PAK) 0.5 MG X 11 & 1 MG X 42 tablet Take one 0.5 mg tab po once daily for 3 days, then increase to one 0.5 mg tab bid for 4 days, then increase to one 1 mg tab bid. 05/19/19   Luetta Nutting, DO  Verapamil HCl CR 300 MG CP24 TAKE 1 CAPSULE (300 MG TOTAL) BY MOUTH DAILY. PLEASE CALL TO SCHEDULE OVERDUE APPT WITH DR ROSS 08/19/19   Luetta Nutting, DO       LABS:  CBC Latest Ref Rng & Units 08/17/2019 08/16/2019 01/05/2019  WBC 4.0 -  10.5 K/uL 7.6 7.4 8.3  Hemoglobin 12.0 - 15.0 g/dL 16.2(H) 16.4(H) 16.0(H)  Hematocrit 36.0 - 46.0 % 52.3(H) 53.2(H) 48.4(H)   Platelets 150 - 400 K/uL 184 184 215.0    CMP Latest Ref Rng & Units 08/19/2019 08/18/2019 08/17/2019  Glucose 70 - 99 mg/dL 157(H) 121(H) 165(H)  BUN 8 - 23 mg/dL 25(H) 20 19  Creatinine 0.44 - 1.00 mg/dL 2.64(H) 2.21(H) 1.80(H)  Sodium 135 - 145 mmol/L 143 143 143  Potassium 3.5 - 5.1 mmol/L 4.0 3.9 3.3(L)  Chloride 98 - 111 mmol/L 103 100 101  CO2 22 - 32 mmol/L 29 32 28  Calcium 8.9 - 10.3 mg/dL 8.2(L) 8.6(L) 9.2  Total Protein 6.5 - 8.1 g/dL - - 6.4(L)  Total Bilirubin 0.3 - 1.2 mg/dL - - 1.7(H)  Alkaline Phos 38 - 126 U/L - - 88  AST 15 - 41 U/L - - 20  ALT 0 - 44 U/L - - 12    Lab Results  Component Value Date   CALCIUM 8.2 (L) 08/19/2019   CAION 1.18 09/30/2013   PHOS 3.8 01/27/2012       Component Value Date/Time   COLORURINE AMBER (A) 08/18/2019 2150   APPEARANCEUR CLOUDY (A) 08/18/2019 2150   LABSPEC 1.017 08/18/2019 2150   PHURINE 5.0 08/18/2019 2150   GLUCOSEU NEGATIVE 08/18/2019 2150   HGBUR MODERATE (A) 08/18/2019 2150   BILIRUBINUR NEGATIVE 08/18/2019 2150   BILIRUBINUR 1+ 02/07/2019 1433   KETONESUR NEGATIVE 08/18/2019 2150   PROTEINUR >=300 (A) 08/18/2019 2150   UROBILINOGEN 0.2 02/07/2019 1433   UROBILINOGEN 0.2 07/07/2013 1353   NITRITE POSITIVE (A) 08/18/2019 2150   LEUKOCYTESUR LARGE (A) 08/18/2019 2150      Component Value Date/Time   PHART 7.420 07/30/2017 1338   PCO2ART 45.0 07/30/2017 1338   PO2ART 59.0 (L) 07/30/2017 1338   HCO3 33.0 (H) 07/30/2017 1339   TCO2 35 07/30/2017 1339   ACIDBASEDEF 0.8 12/20/2008 0130   O2SAT 55.0 07/30/2017 1339    No results found for: IRON, TIBC, FERRITIN, IRONPCTSAT     ASSESSMENT/PLAN:     1.  Chronic kidney disease stage III.  Her baseline serum creatinine appears to run in the mid ones.  Will check urine protein to creatinine ratio.  Does have underlying diabetes.  2.  Acute kidney injury.  Likely secondary to diuresis with low blood pressure.  Does not appear to be over diuresed.  Suspect the low  blood pressure is playing a large role.  Will be tricky to continue to diurese effectively in the setting of right-sided heart failure.  Agree with holding diuretics today and reassessing tomorrow.  3.  Hypertensive urgency.  Blood pressures elevated on admission, now currently on the low side.  Agree with discontinuation of her verapamil.  4.  Diabetes mellitus.  Changes per primary team.  5.  Acute on chronic systolic and diastolic congestive heart failure.  Suspect she has a significant opponent of right-sided heart failure.  Continue to diurese as able.  Continue CPAP support for obstructive sleep apnea.  Pulmonary hypertension playing a big role here.  Will need to be on maintenance diuretics despite low blood pressures once renal function improves.     [place], DO, [place]

## 2019-08-19 NOTE — Progress Notes (Addendum)
Progress Note  Patient Name: [name] Date of Encounter: 08/19/2019  Primary Cardiologist: Dorris Carnes, MD   Subjective   States she feels much better. Breathing significantly improved. Currently w/o supplemental O2 requirements. Was able to sleep fairly comfortably last night with head of bed reclined w/o orthopnea/PND. Notes good UOP last night. Denies recent CP. Admits to being noncompliant w/ CPAP at home.    Inpatient Medications    Scheduled Meds: . allopurinol  100 mg Oral Daily  . aspirin EC  81 mg Oral Daily  . busPIRone  15 mg Oral BID  . enoxaparin (LOVENOX) injection  40 mg Subcutaneous Q24H  . FLUoxetine  20 mg Oral QHS  . gabapentin  400 mg Oral TID  . insulin aspart  0-9 Units Subcutaneous TID WC  . insulin aspart  20 Units Subcutaneous TID WC  . insulin glargine  120 Units Subcutaneous Daily  . isosorbide mononitrate  60 mg Oral Daily  . nicotine  21 mg Transdermal Daily  . pantoprazole  40 mg Oral Daily  . potassium chloride SA  20 mEq Oral Daily  . rosuvastatin  20 mg Oral Daily  . topiramate  25 mg Oral BID  . verapamil  300 mg Oral Daily   Continuous Infusions: . cefTRIAXone (ROCEPHIN)  IV 1 g (08/19/19 0955)   PRN Meds: acetaminophen **OR** acetaminophen, albuterol, hydrALAZINE, nitroGLYCERIN, ondansetron **OR** ondansetron (ZOFRAN) IV   Vital Signs    Vitals:   08/18/19 2047 08/19/19 0053 08/19/19 0512 08/19/19 0750  BP: 114/69 (!) 116/57 124/61 124/65  Pulse: 69 63 64 69  Resp: 18 18 18 20   Temp: 98.1 F (36.7 C) 97.7 F (36.5 C) 97.6 F (36.4 C) 97.6 F (36.4 C)  TempSrc: Oral Oral Oral Oral  SpO2: 94% 93% 91% 92%  Weight:  114.9 kg    Height:        Intake/Output Summary (Last 24 hours) at 08/19/2019 1110 Last data filed at 08/19/2019 0856 Gross per 24 hour  Intake 960 ml  Output 775 ml  Net 185 ml   Last 3 Weights 08/19/2019 08/18/2019 08/17/2019  Weight (lbs) 253 lb 6.4 oz 252 lb 255 lb 3.2 oz  Weight (kg) 114.941 kg  114.306 kg 115.758 kg      Telemetry    NSR 70s  - Personally Reviewed  ECG    No new EKGs to review today. - Personally Reviewed  Physical Exam   GEN: morbidly obese middle aged WF in no acute distress.   Neck: thick neck making JVD assessment difficult Cardiac: RRR, no murmurs, rubs, or gallops.  Respiratory: slightly decreased BS on the lt side, faint basilar crackles. GI: obese abdomen, soft, nontender, non-distended  MS: 1+ bilateral pitting edema; compression stockings on Neuro:  Nonfocal  Psych: Normal affect   Labs    High Sensitivity Troponin:   Recent Labs  Lab 08/16/19 1745 08/16/19 2311  TROPONINIHS 23* 29*      Chemistry Recent Labs  Lab 08/17/19 0510 08/18/19 0537 08/19/19 0412  NA 143 143 143  K 3.3* 3.9 4.0  CL 101 100 103  CO2 28 32 29  GLUCOSE 165* 121* 157*  BUN 19 20 25*  CREATININE 1.80* 2.21* 2.64*  CALCIUM 9.2 8.6* 8.2*  PROT 6.4*  --   --   ALBUMIN 3.6  --   --   AST 20  --   --   ALT 12  --   --   ALKPHOS 88  --   --  BILITOT 1.7*  --   --   GFRNONAA 30* 23* 19*  GFRAA 34* 27* 22*  ANIONGAP 14 11 11      Hematology Recent Labs  Lab 08/16/19 1745 08/17/19 0510  WBC 7.4 7.6  RBC 5.65* 5.64*  HGB 16.4* 16.2*  HCT 53.2* 52.3*  MCV 94.2 92.7  MCH 29.0 28.7  MCHC 30.8 31.0  RDW 16.7* 16.5*  PLT 184 184    BNP Recent Labs  Lab 08/17/19 0457  BNP 1,427.6*     DDimer No results for input(s): DDIMER in the last 168 hours.   Radiology    No results found.  Cardiac Studies   2D echo 08/18/19  1. The left ventricle has moderate-severely reduced systolic function, with an ejection fraction of 30-35%. The cavity size was normal. There is severe concentric left ventricular hypertrophy. Left ventricular diastolic Doppler parameters are consistent with restrictive filling. Elevated left atrial and left ventricular end-diastolic pressures Left ventricular diffuse hypokinesis. 2. The right ventricle has severely reduced  systolic function. The cavity was moderately enlarged. There is no increase in right ventricular wall thickness. Right ventricular systolic pressure is normal. 3. Left atrial size was mildly dilated. 4. Large pleural effusion in the left lateral region. 5. Moderate thickening of the mitral valve leaflet. Moderate calcification of the mitral valve leaflet. Mild mitral valve stenosis. 6. The aorta is normal unless otherwise noted.  Patient Profile     [name] is a 63 y.o. female with a hx of chronic diastolic CHF, PSVT, prior CVA, HTN, DM, OSA, COPD, mild nonobstructive CAD, pulmonary hypertension and CSF rhinorrhea s/p skull base reconstruction and going tobacco smoking who is being seen today for the evaluation of CHF at the request of Dr. Waldron Labs.   Assessment & Plan    1. Acute Combined LV Systolic and Diastolic CHF and RV Failure: LV dysfunction is new. EF 30-35% w/ diffuse hypokinesis, severe concentric LVH and severely educed RV systolic function.  RVSP is normal at 25 mmHg. Suspect cardiomyopathy is likely nonischemic. Cardiac cath 63 y/o in 2018 showed mild nonobstructive CAD with only 35% prox LAD lesion and 25% ost 2nd marginal lesion. She denies any recent anginal symptomatology. Suspect she probably has some element of hypoventilation obesity syndrome. Prior diagnosis of OSA but none compliant w/ CPAP. Also suspect hypertensive heart disease. Presented w/ Hypertensive Urgency. Acute exacerbation of HF in the setting of poor compliance w/ home diuretics and dietary indiscretion w/ sodium.     Breathing improved but remains volume overloaded w/ edema on exam. Echo also demonstrated large Lt sided pleural effusion  Continue IV diuretics + TED hoses for edema  Pt reports good UOP overnight but Little UOP documented. No current order for strict I/Os. I will order. Follow closely  Low salt diet  Daily weights  Continue to treat HTN. Controlled today. 124/65. HR controlled  in the 60s  Currently on CCB, Verapamil. Given Systolic Dysfunction, will need to d/c.   No  blocker currently. ? Low output   No ACE/ARB/ARNI due to renal function  Monitor SCr closely while diuresing. SCr continues to rise, now at 2.64  ? Cardiorenal syndrome/ low output HF from biventricular failure  Needs wt loss   2. Acute on Chronic Kidney Disease: worsening renal function. SCr up to 2.64, in the setting of Biventricular heart failure  Concern for cardiorenal syndrome/ low output HF  Consider PICC line for Co-ox and CVP monitoring. May need milrinone and AHF consult  For questions or updates, please contact [place] Please consult [email or web address] for contact info under        Signed, Lyda Jester, PA-C  08/19/2019, 11:10 AM    History and all data above reviewed.  Patient examined.  I agree with the findings as above.  Creat up with diuresis.  She has RV and LV dysfunction and is likely preload dependent.  She is much better symptomatically.  The patient exam reveals COR:RRR  ,  Lungs: Clear  ,  Abd: Positive bowel sounds, no rebound no guarding, Ext No edema  .  All available labs, radiology testing, previous records reviewed. Agree with documented assessment and plan. Acute on chronic renal insufficiency:  Awaiting consult from nephrology.  She still has volume overload.  I am going to hold off on further diuresis however.  She needs a right heart cath which we will schedule for Monday.  Check creat in AM and if stable I would continue IV diuresis.  I had a long conversation with her niece and the patient.  She will need significant lifestyle modification.      Jeneen Rinks Olie Scaffidi  6:04 PM  08/19/2019

## 2019-08-19 NOTE — Progress Notes (Signed)
Inpatient Diabetes Program Recommendations  AACE/ADA: New Consensus Statement on Inpatient Glycemic Control (2015)  Target Ranges:  Prepandial:   less than 140 mg/dL      Peak postprandial:   less than 180 mg/dL (1-2 hours)      Critically ill patients:  140 - 180 mg/dL   Lab Results  Component Value Date   GLUCAP 51 (L) 08/19/2019   HGBA1C 8.2 (H) 05/23/2019    Review of Glycemic Control  Diabetes history: DM 2 Outpatient Diabetes medications: Toujeo 120 units Daily, Novolog 30-40 units tid with meals Current orders for Inpatient glycemic control:  Lantus 120 units Daily Novolog 0-9 units tid Novolog 20 units tid meal coverage  Inpatient Diabetes Program Recommendations:    Hypoglycemia 51 at lunch. Novolog 20 units meal coverage given, however patient only consumed 25% of meal.  No changes at this time. Make sure parameters on meal coverage.  (patient to eat at least 50% of meal and glucose is at least 80 mg/dl.)   Thanks,  Tama Headings RN, MSN, BC-ADM Inpatient Diabetes Coordinator Team Pager [phone] (8a-5p)

## 2019-08-19 NOTE — Care Management Important Message (Signed)
Important Message  Patient Details  Name: [name] MRN: [id number] Date of Birth: December 06, 1956   Medicare Important Message Given:  Yes     Shelda Altes 08/19/2019, 12:20 PM

## 2019-08-19 NOTE — Progress Notes (Signed)
PROGRESS NOTE                                                                                                                                                                                                             Patient Demographics:    [name], is a 63 y.o. female, DOB - [birth year], WUJ:[id number]  Admit date - 08/16/2019   Admitting Physician Rise Patience, MD  Outpatient Primary MD for the patient is Angel Nutting, DO  LOS - 3   Chief Complaint  Patient presents with  . Shortness of Breath  . Chest Pain       Brief Narrative    63 y.o. female with history of chronic diastolic CHF, hypertension, chronic kidney disease, pulmonary hypertension diabetes mellitus, CVA, CSF rhinorrhea status post reconstruction surgery sleep apnea and ongoing tobacco abuse presents to the ER because of increasing shortness of breath over the last 3 weeks, work-up significant for volume overload, admitted for IV diuresis.  Patient was on IV Lasix initially, but this has been held as her creatinine kept trending up despite holding her IV diuresis, cardiology were consulted to assist with diuresis, and renal consulted to assist with worsening renal function.   Subjective:    [name] today still reports some dyspnea, but it did improve, denies cough, fever or chills.     Assessment  & Plan :    Principal Problem:   Acute diastolic CHF (congestive heart failure) (HCC) Active Problems:   Essential hypertension   Morbid obesity due to excess calories (HCC)   HLD (hyperlipidemia)   OSA (obstructive sleep apnea)   COPD  GOLD 0 still smoking   Pulmonary hypertension, unspecified (HCC)   Type 2 diabetes mellitus with stage 3 chronic kidney disease, with long-term current use of insulin (HCC)  Acute on chronic combined systolic/diastolic CHF, and RV failure. -Most recent echo and 2018 showing EF was 60 to 65% with grade 2 diastolic  dysfunction, repeat echo this admission showing EF 30-35% w/ diffuse hypokinesis, severe concentric LVH and severely educed RV systolic function.  -Diuresis has been currently on hold in the setting of worsening renal function. -Cardiology service consulted, will await further recommendation.   AKI on Chronic kidney disease stage III  -Baseline creatinine around 1.5, continue to trend up despite holding diuresis, it is 2.6 today -  Avoid nephrotoxic medication  - following with Dr. Hollie Salk .  Renal service consulted for further recommendation.  Hypertensive urgency -patient blood pressure  elevated in ED. -Currently blood pressure on the lower side, I will discontinue her verapamil her systolic blood pressure in the 80s and 90s, which might worsen her renal function.  Diabetes mellitus type 2 -CBG is lower today, will decrease her Lantus to 60 units subcu daily, and will DC scheduled 20 units of NovoLog before meals, but will continue with insulin sliding scale .  Sleep apnea on CPAP.  COPD  - not actively wheezing continue inhalers.   Tobacco abuse  - advised about quitting.  Hyperlipidemia on statins.  History of CVA  - on statins and antiplatelet agents.  History of gout  - on allopurinol.  History of depression and anxiety  - on Prozac and BuSpar.   COVID-19 Labs  No results for input(s): DDIMER, FERRITIN, LDH, CRP in the last 72 hours.  Lab Results  Component Value Date   [place] NEGATIVE 08/17/2019     Code Status : Full code  Family Communication  : Discussed with patient  Disposition Plan  : Home once stable  Barriers For Discharge : Remains significantly dyspneic on physical exam, significant volume overload, in need of IV diuresis with close renal function monitoring given worsening creatinine  Consults  : None  Procedures  : None  DVT Prophylaxis  : Subcu Lovenox  Lab Results  Component Value Date   PLT 184 08/17/2019    Antibiotics  :     Anti-infectives (From admission, onward)   Start     Dose/Rate Route Frequency Ordered Stop   08/19/19 0815  cefTRIAXone (ROCEPHIN) 1 g in sodium chloride 0.9 % 100 mL IVPB     1 g 200 mL/hr over 30 Minutes Intravenous Every 24 hours 08/19/19 0814          Objective:   Vitals:   08/19/19 0512 08/19/19 0750 08/19/19 1145 08/19/19 1253  BP: 124/61 124/65 (!) 88/48 (!) 87/61  Pulse: 64 69 69 69  Resp: 18 20 20    Temp: 97.6 F (36.4 C) 97.6 F (36.4 C) (!) 97.4 F (36.3 C) (!) 97.5 F (36.4 C)  TempSrc: Oral Oral Oral Oral  SpO2: 91% 92% 92% 94%  Weight:      Height:        Wt Readings from Last 3 Encounters:  08/19/19 114.9 kg  08/16/19 116.6 kg  07/04/19 113.3 kg     Intake/Output Summary (Last 24 hours) at 08/19/2019 1344 Last data filed at 08/19/2019 0856 Gross per 24 hour  Intake 960 ml  Output 575 ml  Net 385 ml     Physical Exam  Awake Alert, Oriented X 3, No new F.N deficits, Normal affect Symmetrical Chest wall movement, Good air movement bilaterally, CTAB RRR,No Gallops,Rubs or new Murmurs, No Parasternal Heave +ve B.Sounds, Abd Soft, No tenderness, No rebound - guarding or rigidity. No Cyanosis, Clubbing ,+2  edema, No new Rash or bruise        Data Review:    CBC Recent Labs  Lab 08/16/19 1745 08/17/19 0510  WBC 7.4 7.6  HGB 16.4* 16.2*  HCT 53.2* 52.3*  PLT 184 184  MCV 94.2 92.7  MCH 29.0 28.7  MCHC 30.8 31.0  RDW 16.7* 16.5*  LYMPHSABS  --  1.8  MONOABS  --  0.4  EOSABS  --  0.2  BASOSABS  --  0.0    Chemistries  Recent Labs  Lab 08/16/19 1745 08/17/19 0510 08/18/19 0537 08/19/19 0412  NA 144 143 143 143  K 4.0 3.3* 3.9 4.0  CL 103 101 100 103  CO2 30 28 32 29  GLUCOSE 182* 165* 121* 157*  BUN 18 19 20  25*  CREATININE 1.72* 1.80* 2.21* 2.64*  CALCIUM 9.3 9.2 8.6* 8.2*  MG  --  1.7  --   --   AST  --  20  --   --   ALT  --  12  --   --   ALKPHOS  --  88  --   --   BILITOT  --  1.7*  --   --     ------------------------------------------------------------------------------------------------------------------ No results for input(s): CHOL, HDL, LDLCALC, TRIG, CHOLHDL, LDLDIRECT in the last 72 hours.  Lab Results  Component Value Date   HGBA1C 8.2 (H) 05/23/2019   ------------------------------------------------------------------------------------------------------------------ Recent Labs    08/17/19 0457  TSH 4.742*   ------------------------------------------------------------------------------------------------------------------ No results for input(s): VITAMINB12, FOLATE, FERRITIN, TIBC, IRON, RETICCTPCT in the last 72 hours.  Coagulation profile No results for input(s): INR, PROTIME in the last 168 hours.  No results for input(s): DDIMER in the last 72 hours.  Cardiac Enzymes No results for input(s): CKMB, TROPONINI, MYOGLOBIN in the last 168 hours.  Invalid input(s): CK ------------------------------------------------------------------------------------------------------------------    Component Value Date/Time   BNP 1,427.6 (H) 08/17/2019 0457   BNP 43.7 11/07/2011 1436    Inpatient Medications  Scheduled Meds: . allopurinol  100 mg Oral Daily  . aspirin EC  81 mg Oral Daily  . busPIRone  15 mg Oral BID  . enoxaparin (LOVENOX) injection  40 mg Subcutaneous Q24H  . FLUoxetine  20 mg Oral QHS  . gabapentin  400 mg Oral TID  . insulin aspart  0-9 Units Subcutaneous TID WC  . insulin glargine  60 Units Subcutaneous Daily  . isosorbide mononitrate  60 mg Oral Daily  . nicotine  21 mg Transdermal Daily  . pantoprazole  40 mg Oral Daily  . potassium chloride SA  20 mEq Oral Daily  . rosuvastatin  20 mg Oral Daily  . topiramate  25 mg Oral BID  . verapamil  300 mg Oral Daily   Continuous Infusions: . cefTRIAXone (ROCEPHIN)  IV 1 g (08/19/19 0955)   PRN Meds:.acetaminophen **OR** acetaminophen, albuterol, hydrALAZINE, nitroGLYCERIN, ondansetron **OR**  ondansetron (ZOFRAN) IV  Micro Results Recent Results (from the past 240 hour(s))  SARS Coronavirus 2 [facility] order, Performed in [facility] hospital lab) Nasopharyngeal Nasopharyngeal Swab     Status: None   Collection Time: 08/17/19 12:38 AM   Specimen: Nasopharyngeal Swab  Result Value Ref Range Status   SARS Coronavirus 2 NEGATIVE NEGATIVE Final    Comment: (NOTE) If result is NEGATIVE SARS-CoV-2 target nucleic acids are NOT DETECTED. The SARS-CoV-2 RNA is generally detectable in upper and lower  respiratory specimens during the acute phase of infection. The lowest  concentration of SARS-CoV-2 viral copies this assay can detect is 250  copies / mL. A negative result does not preclude SARS-CoV-2 infection  and should not be used as the sole basis for treatment or other  patient management decisions.  A negative result may occur with  improper specimen collection / handling, submission of specimen other  than nasopharyngeal swab, presence of viral mutation(s) within the  areas targeted by this assay, and inadequate number of viral copies  (<250 copies / mL). A negative result must be combined with clinical  observations, patient history, and epidemiological information. If result is POSITIVE SARS-CoV-2 target nucleic acids are DETECTED. The SARS-CoV-2 RNA is generally detectable in upper and lower  respiratory specimens dur ing the acute phase of infection.  Positive  results are indicative of active infection with SARS-CoV-2.  Clinical  correlation with patient history and other diagnostic information is  necessary to determine patient infection status.  Positive results do  not rule out bacterial infection or co-infection with other viruses. If result is PRESUMPTIVE POSTIVE SARS-CoV-2 nucleic acids MAY BE PRESENT.   A presumptive positive result was obtained on the submitted specimen  and confirmed on repeat testing.  While 2019 novel coronavirus  (SARS-CoV-2) nucleic acids  may be present in the submitted sample  additional confirmatory testing may be necessary for epidemiological  and / or clinical management purposes  to differentiate between  SARS-CoV-2 and other Sarbecovirus currently known to infect humans.  If clinically indicated additional testing with an alternate test  methodology [phone]) is advised. The SARS-CoV-2 RNA is generally  detectable in upper and lower respiratory sp ecimens during the acute  phase of infection. The expected result is Negative. Fact Sheet for Patients:  StrictlyIdeas.no Fact Sheet for Healthcare Providers: BankingDealers.co.za This test is not yet approved or cleared by the [place] FDA and has been authorized for detection and/or diagnosis of SARS-CoV-2 by FDA under an Emergency Use Authorization (EUA).  This EUA will remain in effect (meaning this test can be used) for the duration of the COVID-19 declaration under Section 564(b)(1) of the Act, 21 U.S.C. section 360bbb-3(b)(1), unless the authorization is terminated or revoked sooner. Performed at [facility], [place] [address] [place], [place] [address]     Radiology Reports Dg Chest 2 View  Result Date: 08/16/2019 CLINICAL DATA:  63 year old female with chest pain and shortness of breath. EXAM: CHEST - 2 VIEW COMPARISON:  Chest radiograph dated 01/05/2019 FINDINGS: Small left pleural effusion and left lung base atelectasis versus infiltrate. Overall slight interval increase in the size of the left pleural effusion and associated left lung base opacity compared to the prior radiograph. The right lung remains clear. No pneumothorax. Stable cardiac silhouette. No acute osseous pathology. Degenerative changes of the spine. IMPRESSION: Small left pleural effusion and left lung base atelectasis versus infiltrate, increased in size since the prior radiograph. Electronically Signed   By: Anner Crete M.D.   On:  08/16/2019 19:01     Phillips Climes M.D on 08/19/2019 at 1:44 PM  Between 7am to 7pm - Pager - [phone]  After 7pm go to [email or web address] - password [facility]  [facility] -  Office  [phone]

## 2019-08-20 ENCOUNTER — Inpatient Hospital Stay (HOSPITAL_COMMUNITY): Payer: Medicare Other

## 2019-08-20 DIAGNOSIS — N189 Chronic kidney disease, unspecified: Secondary | ICD-10-CM

## 2019-08-20 DIAGNOSIS — I10 Essential (primary) hypertension: Secondary | ICD-10-CM

## 2019-08-20 DIAGNOSIS — I5082 Biventricular heart failure: Secondary | ICD-10-CM

## 2019-08-20 DIAGNOSIS — N179 Acute kidney failure, unspecified: Secondary | ICD-10-CM

## 2019-08-20 DIAGNOSIS — I2583 Coronary atherosclerosis due to lipid rich plaque: Secondary | ICD-10-CM

## 2019-08-20 DIAGNOSIS — I5031 Acute diastolic (congestive) heart failure: Secondary | ICD-10-CM

## 2019-08-20 DIAGNOSIS — R570 Cardiogenic shock: Secondary | ICD-10-CM

## 2019-08-20 DIAGNOSIS — I2510 Atherosclerotic heart disease of native coronary artery without angina pectoris: Secondary | ICD-10-CM

## 2019-08-20 LAB — BASIC METABOLIC PANEL
Anion gap: 13 (ref 5–15)
BUN: 32 mg/dL — ABNORMAL HIGH (ref 8–23)
CO2: 27 mmol/L (ref 22–32)
Calcium: 8.1 mg/dL — ABNORMAL LOW (ref 8.9–10.3)
Chloride: 102 mmol/L (ref 98–111)
Creatinine, Ser: 2.73 mg/dL — ABNORMAL HIGH (ref 0.44–1.00)
GFR calc Af Amer: 21 mL/min — ABNORMAL LOW (ref >60)
GFR calc non Af Amer: 18 mL/min — ABNORMAL LOW (ref >60)
Glucose, Bld: 93 mg/dL (ref 70–99)
Potassium: 4.3 mmol/L (ref 3.5–5.1)
Sodium: 142 mmol/L (ref 135–145)

## 2019-08-20 LAB — COOXEMETRY PANEL
Carboxyhemoglobin: 2 % — ABNORMAL HIGH (ref 0.5–1.5)
Methemoglobin: 1.1 % (ref 0.0–1.5)
O2 Saturation: 70.5 %
Total hemoglobin: 15.2 g/dL (ref 12.0–16.0)

## 2019-08-20 LAB — URINE CULTURE

## 2019-08-20 LAB — MAGNESIUM: Magnesium: 1.9 mg/dL (ref 1.7–2.4)

## 2019-08-20 LAB — GLUCOSE, CAPILLARY
Glucose-Capillary: 107 mg/dL — ABNORMAL HIGH (ref 70–99)
Glucose-Capillary: 166 mg/dL — ABNORMAL HIGH (ref 70–99)
Glucose-Capillary: 106 mg/dL — ABNORMAL HIGH (ref 70–99)
Glucose-Capillary: 160 mg/dL — ABNORMAL HIGH (ref 70–99)

## 2019-08-20 MED ORDER — ASPIRIN 81 MG PO CHEW
81.0000 mg | CHEWABLE_TABLET | ORAL | Status: AC
  Administered 2019-08-22: 81 mg via ORAL
  Filled 2019-08-20: qty 1

## 2019-08-20 MED ORDER — MIDAZOLAM HCL 2 MG/2ML IJ SOLN
INTRAMUSCULAR | Status: AC
  Administered 2019-08-20: 2 mg via INTRAVENOUS
  Filled 2019-08-20: qty 2

## 2019-08-20 MED ORDER — SODIUM CHLORIDE 0.9 % IV SOLN: 250.0000 mL | INTRAVENOUS | Status: DC | PRN

## 2019-08-20 MED ORDER — SODIUM CHLORIDE 0.9 % IV SOLN
INTRAVENOUS | Status: DC
  Administered 2019-08-21: via INTRAVENOUS

## 2019-08-20 MED ORDER — CHLORHEXIDINE GLUCONATE CLOTH 2 % EX PADS
6.0000 | MEDICATED_PAD | Freq: Every day | CUTANEOUS | Status: DC
  Administered 2019-08-20 – 2019-08-24 (×5): 6 via TOPICAL

## 2019-08-20 MED ORDER — MIDAZOLAM HCL 2 MG/2ML IJ SOLN
2.0000 mg | Freq: Once | INTRAMUSCULAR | Status: AC
  Administered 2019-08-20: 15:00:00 2 mg via INTRAVENOUS

## 2019-08-20 MED ORDER — FENTANYL CITRATE (PF) 100 MCG/2ML IJ SOLN
INTRAMUSCULAR | Status: AC
  Administered 2019-08-20: 12.5 ug via INTRAVENOUS
  Filled 2019-08-20: qty 2

## 2019-08-20 MED ORDER — FUROSEMIDE 10 MG/ML IJ SOLN
8.0000 mg/h | INTRAVENOUS | Status: DC
  Administered 2019-08-20: 15 mg/h via INTRAVENOUS
  Administered 2019-08-21: 8 mg/h via INTRAVENOUS
  Filled 2019-08-20 (×2): qty 25

## 2019-08-20 MED ORDER — SODIUM CHLORIDE 0.9% FLUSH: 3.0000 mL | INTRAVENOUS | Status: DC | PRN

## 2019-08-20 MED ORDER — ORAL CARE MOUTH RINSE
15.0000 mL | Freq: Two times a day (BID) | OROMUCOSAL | Status: DC
  Administered 2019-08-20 – 2019-08-26 (×12): 15 mL via OROMUCOSAL

## 2019-08-20 MED ORDER — MILRINONE LACTATE IN DEXTROSE 20-5 MG/100ML-% IV SOLN
0.1250 ug/kg/min | INTRAVENOUS | Status: DC
  Administered 2019-08-20 – 2019-08-22 (×4): 0.25 ug/kg/min via INTRAVENOUS
  Administered 2019-08-22: 16:00:00 0.125 ug/kg/min via INTRAVENOUS
  Filled 2019-08-20 (×5): qty 100

## 2019-08-20 MED ORDER — FENTANYL CITRATE (PF) 100 MCG/2ML IJ SOLN
12.5000 ug | Freq: Once | INTRAMUSCULAR | Status: AC
  Administered 2019-08-20: 15:00:00 12.5 ug via INTRAVENOUS

## 2019-08-20 NOTE — Progress Notes (Signed)
Notified of 32-beat run of NSVT, pt asymptomatic. I've added a Mg lab to be checked.  Have consulted PCCM for central line for coox and likely milrinone.   Primary is transferring to 2H to help facilitate this.

## 2019-08-20 NOTE — Progress Notes (Signed)
Progress Note  Patient Name: [name] Date of Encounter: 08/20/2019  Primary Cardiologist: Dorris Carnes, MD   Subjective   Breathing much improved.  O2 sats 91% on RA.  Inpatient Medications    Scheduled Meds: . allopurinol  100 mg Oral Daily  . aspirin EC  81 mg Oral Daily  . busPIRone  15 mg Oral BID  . enoxaparin (LOVENOX) injection  30 mg Subcutaneous Q24H  . FLUoxetine  20 mg Oral QHS  . gabapentin  400 mg Oral TID  . insulin aspart  0-9 Units Subcutaneous TID WC  . insulin glargine  60 Units Subcutaneous Daily  . isosorbide mononitrate  60 mg Oral Daily  . nicotine  21 mg Transdermal Daily  . pantoprazole  40 mg Oral Daily  . potassium chloride SA  20 mEq Oral Daily  . rosuvastatin  20 mg Oral Daily  . topiramate  25 mg Oral BID   Continuous Infusions: . cefTRIAXone (ROCEPHIN)  IV 1 g (08/19/19 0955)   PRN Meds: acetaminophen **OR** acetaminophen, albuterol, hydrALAZINE, nitroGLYCERIN, ondansetron **OR** ondansetron (ZOFRAN) IV   Vital Signs    Vitals:   08/19/19 1556 08/19/19 1941 08/20/19 0446 08/20/19 0834  BP: 98/78 110/64 119/62 111/63  Pulse: 63 66 69 71  Resp:  18 18   Temp:  97.9 F (36.6 C) 98 F (36.7 C)   TempSrc:  Oral Oral   SpO2:  97% 91%   Weight:   116.3 kg   Height:        Intake/Output Summary (Last 24 hours) at 08/20/2019 1040 Last data filed at 08/20/2019 6720 Gross per 24 hour  Intake 1211 ml  Output 550 ml  Net 661 ml   Last 3 Weights 08/20/2019 08/19/2019 08/18/2019  Weight (lbs) 256 lb 6.4 oz 253 lb 6.4 oz 252 lb  Weight (kg) 116.302 kg 114.941 kg 114.306 kg      Telemetry    NSR - Personally Reviewed  ECG    No new EKG to review - Personally Reviewed  Physical Exam   GEN: Well nourished, well developed in no acute distress HEENT: Normal NECK: No JVD; No carotid bruits LYMPHATICS: No lymphadenopathy CARDIAC:RRR, no murmurs, rubs, gallops RESPIRATORY:  Clear to auscultation without rales, wheezing or  rhonchi  ABDOMEN: Soft, non-tender, non-distended MUSCULOSKELETAL:  No edema; No deformity  SKIN: Warm and dry NEUROLOGIC:  Alert and oriented x 3 PSYCHIATRIC:  Normal affect    Labs    High Sensitivity Troponin:   Recent Labs  Lab 08/16/19 1745 08/16/19 2311  TROPONINIHS 23* 29*      Chemistry Recent Labs  Lab 08/17/19 0510 08/18/19 0537 08/19/19 0412 08/20/19 0633  NA 143 143 143 142  K 3.3* 3.9 4.0 4.3  CL 101 100 103 102  CO2 28 32 29 27  GLUCOSE 165* 121* 157* 93  BUN 19 20 25* 32*  CREATININE 1.80* 2.21* 2.64* 2.73*  CALCIUM 9.2 8.6* 8.2* 8.1*  PROT 6.4*  --   --   --   ALBUMIN 3.6  --   --   --   AST 20  --   --   --   ALT 12  --   --   --   ALKPHOS 88  --   --   --   BILITOT 1.7*  --   --   --   GFRNONAA 30* 23* 19* 18*  GFRAA 34* 27* 22* 21*  ANIONGAP 14 11 11  13  Hematology Recent Labs  Lab 08/16/19 1745 08/17/19 0510  WBC 7.4 7.6  RBC 5.65* 5.64*  HGB 16.4* 16.2*  HCT 53.2* 52.3*  MCV 94.2 92.7  MCH 29.0 28.7  MCHC 30.8 31.0  RDW 16.7* 16.5*  PLT 184 184    BNP Recent Labs  Lab 08/17/19 0457  BNP 1,427.6*     DDimer No results for input(s): DDIMER in the last 168 hours.   Radiology    No results found.  Cardiac Studies   2D echo 08/18/19  1. The left ventricle has moderate-severely reduced systolic function, with an ejection fraction of 30-35%. The cavity size was normal. There is severe concentric left ventricular hypertrophy. Left ventricular diastolic Doppler parameters are consistent with restrictive filling. Elevated left atrial and left ventricular end-diastolic pressures Left ventricular diffuse hypokinesis. 2. The right ventricle has severely reduced systolic function. The cavity was moderately enlarged. There is no increase in right ventricular wall thickness. Right ventricular systolic pressure is normal. 3. Left atrial size was mildly dilated. 4. Large pleural effusion in the left lateral region. 5. Moderate  thickening of the mitral valve leaflet. Moderate calcification of the mitral valve leaflet. Mild mitral valve stenosis. 6. The aorta is normal unless otherwise noted.  Patient Profile     [name] is a 63 y.o. female with a hx of chronic diastolic CHF, PSVT, prior CVA, HTN, DM, OSA, COPD, mild nonobstructive CAD, pulmonary hypertension and CSF rhinorrhea s/p skull base reconstruction and going tobacco smoking who is being seen today for the evaluation of CHF at the request of Dr. Waldron Labs.   Assessment & Plan    1. Acute Combined LV Systolic and Diastolic CHF and RV Failure: LV dysfunction is new. EF 30-35% w/ diffuse hypokinesis, severe concentric LVH and severely educed RV systolic function.  RVSP is normal at 25 mmHg. Suspect cardiomyopathy is likely nonischemic. Cardiac cath 63 y/o in 2018 showed mild nonobstructive CAD with only 35% prox LAD lesion and 25% ost 2nd marginal lesion. She denies any recent anginal symptomatology. Suspect she probably has some element of hypoventilation obesity syndrome. Prior diagnosis of OSA but none compliant w/ CPAP. Also suspect hypertensive heart disease. Presented w/ Hypertensive Urgency. Acute exacerbation of HF in the setting of poor compliance w/ home diuretics and dietary indiscretion w/ sodium.     Breathing improved but remains volume overloaded w/ edema on exam. Echo also demonstrated large Lt sided pleural effusion  Continue TED hoses for edema  She put out 800cc yesterday and is new neg 1L  Continue Low salt diet and daily weights  No  blocker currently. ? Low output   No ACE/ARB/ARNI due to renal function  Diuretics now on hold due to worsening renal function with creatinine up to 2.73  ? Cardiorenal syndrome/ low output HF from biventricular failure  She has severely dilated RV and severe RV dysfunction   Place PICC line today and get co-ox  Will ask AHF to see - may need inotropic support and right heart cath  2. Acute  on Chronic Kidney Disease:   Creatinine continues to trend upward now at 2.73  Concern for cardiorenal syndrome/ low output HF  Diuretics on hold  Will get PICC line for Co-ox and CVP monitoring.   Will ask AHF service to see  May need milrinone  3.  HTN -BP controlled this am -only med for BP at this time is Imdur  5.  Non obstructive CAD -cath in 2018 with 35% pLAD,  25% oOM2 -no anginal sx -continue ASA 81mg  daily, statin and Imdur  I have spent a total of 40 minutes with patient reviewing 2D echo , telemetry, EKGs, labs and examining patient as well as establishing an assessment and plan that was discussed with the patient.  > 50% of time was spent in direct patient care.    For questions or updates, please contact [place] Please consult [email or web address] for contact info under       Signed, Fransico Him, MD  08/20/2019, 10:40 AM

## 2019-08-20 NOTE — Progress Notes (Signed)
[place] numbers reviewed personally.    MAP = 109 CVP = 25 PA = 64/26 (39) PCWP 26 Thermo 3.9/1.8 PVR 3.33  Co-ox pending.   Results d/w patient and family. Will start milrinone.   Additional CCT 35.mins  Glori Bickers, MD  3:52 PM

## 2019-08-20 NOTE — CV Procedure (Signed)
The risks and indication of the procedure were explained. Consent was signed and placed on the chart. An appropriate timeout was taken prior to the procedure.   The right neck was prepped and draped in the routine sterile fashion and anesthetized with 1% local lidocaine. A 7 FR venous sheath was placed in the right internal jugular vein using a modified Seldinger technique. A standard Swan-Ganz catheter was used for the procedure. The distal tip of the PA cath was maneuvered into the right pulmonary artery under fluoroscopic guidance and the sheath was sutured in place.   CXR: Good position. No PTX.   Glori Bickers, MD  3:49 PM

## 2019-08-20 NOTE — Consult Note (Addendum)
Advanced Heart Failure Team Consult Note   Primary Physician: Luetta Nutting, DO PCP-Cardiologist:  Dorris Carnes, MD  Reason for Consultation: Cardiogenic shock/RV failure  History of Present Illness:   [name] is a 63 y.o. female with a hx of morbid obesity, chronic diastolic CHF, PSVT, prior CVA, HTN, DM, OSA, CKD with baseline creatinine 1.3-1.4, COPD with ongoing tobacco use whom, pulmonary hypertension and CSF rhinorrhea s/p skull base reconstruction whom we are asked to see by Dr. Radford Pax for RV failure and cardiogenic shock .  R/LCardiac catheterization 8/18 demonstrated minor nonobstructive CAD, mildly elevated LVEDP and moderate pulmonary hypertension. LAD 35% OM2 25% RA 10 PA 57/22 (39) PCWP 19 LVEDP 21 Fick 3.7/1.7  Last seen by [facility] 08/14/2017.  Ms. How presented to the ER with worsening shortness of breath, orthopnea, PND, lower extremity edema and > 30 pound weight gain for past couple of weeks.  Her Lasix was discontinued on PCP due to 06/27/2019 secondary to worsening kidney function. She continued to smoke and just stopped a few days ago.   In ER markedly volume overloaded. BNP 1,427. Started on IV lasix. Only modest urine output. Creatinine 1.8 -> 2.7.  Echo EF 25-30% Mod LVH. RV dilated and severely HK. Personally reviewed  Seen by Dr. Radford Pax and transferred to ICU for invasive monitoring.   She is SOB with any activity. + orthopnea or PND. + LE edema. Weight up 35+ pounds   Review of Systems: [y] = yes, _0  = no   . General: Weight gain Blue.Reese ]; Weight loss _1 ; Anorexia _2 ; Fatigue Blue.Reese ]; Fever _3 ; Chills _4 ; Weakness Blue.Reese ]  . Cardiac: Chest pain/pressure _5 ; Resting SOB [ y]; Exertional SOB Blue.Reese ]; Orthopnea Blue.Reese ]; Pedal Edema _6 ; Palpitations _7 ; Syncope _8 ; Presyncope _9 ; Paroxysmal nocturnal dyspnea[y ]  . Pulmonary: Cough Blue.Reese ]; Wheezing[ y]; Hemoptysis_10 ; Sputum Blue.Reese ]; Snoring [ y]  . GI: Vomiting_11 ; Dysphagia_12 ; Melena_13 ;  Hematochezia _14 ; Heartburn_15 ; Abdominal pain _16 ; Constipation _17 ; Diarrhea _18 ; BRBPR _19   . GU: Hematuria_20 ; Dysuria _21 ; Nocturia_22   . Vascular: Pain in legs with walking _23 ; Pain in feet with lying flat _24 ; Non-healing sores _25 ; Stroke _26 ; TIA _27 ; Slurred speech _28 ;  . Neuro: Headaches_29 ; Vertigo_30 ; Seizures_31 ; Paresthesias_32 ;Blurred vision _33 ; Diplopia _34 ; Vision changes _35   . Ortho/Skin: Arthritis Blue.Reese ]; Joint pain Blue.Reese ]; Muscle pain _36 ; Joint swelling Blue.Reese ]; Back Pain [ y]; Rash _37   . Psych: Depression_38 ; Anxiety[ y]  . Heme: Bleeding problems _39 ; Clotting disorders _40 ; Anemia Blue.Reese ]  . Endocrine: Diabetes [ y]; Thyroid dysfunction_41   Home Medications Prior to Admission medications   Medication Sig Start Date End Date Taking? Authorizing Provider  acetaminophen (TYLENOL) 325 MG tablet Take 2 tablets (650 mg total) by mouth every 6 (six) hours as needed for mild pain. 06/30/18  Yes Luetta Nutting, DO  albuterol (VENTOLIN HFA) 108 (90 Base) MCG/ACT inhaler Inhale 2 puffs into the lungs every 6 (six) hours as needed for wheezing or shortness of breath. 04/21/19  Yes Luetta Nutting, DO  allopurinol (ZYLOPRIM) 100 MG tablet Take 100 mg by mouth daily.  06/24/18  Yes [provider]  aspirin EC 81 MG tablet Take 1 tablet (81 mg total) by mouth daily. 07/13/17  Yes Weaver, Scott T, PA-C  busPIRone (BUSPAR) 15 MG tablet Take 15 mg by mouth 2 (two) times daily.  06/17/16  Yes [provider]  FLUoxetine (PROZAC) 20 MG capsule TAKE 1 CAPSULE (20 MG TOTAL) BY MOUTH AT BEDTIME. 07/31/19 08/30/19 Yes Matthews, Cody, DO  gabapentin (NEURONTIN) 400 MG capsule TAKE 1 CAPSULE BY MOUTH THREE TIMES A DAY Patient taking differently: Take 400 mg by mouth 3 (three) times daily.  04/25/19  Yes Luetta Nutting, DO  insulin aspart (NOVOLOG FLEXPEN) 100 UNIT/ML FlexPen Inject 30-40 Units into the skin 3 (three) times daily with meals. 07/04/19  Yes Luetta Nutting, DO  Insulin Glargine  (TOUJEO SOLOSTAR) 300 UNIT/ML SOPN Inject 110 Units into the skin 2 (two) times daily. Patient taking differently: Inject 120 Units into the skin daily.  03/31/18  Yes Luetta Nutting, DO  isosorbide mononitrate (IMDUR) 60 MG 24 hr tablet TAKE 1 TABLET (60 MG TOTAL) BY MOUTH DAILY FOR 30 DAYS. ** DO NOT CRUSH ** 07/18/19 08/17/19 Yes Luetta Nutting, DO  nitroGLYCERIN (NITROSTAT) 0.4 MG SL tablet Place 1 tablet (0.4 mg total) under the tongue every 5 (five) minutes as needed. Patient taking differently: Place 0.4 mg under the tongue every 5 (five) minutes as needed for chest pain.  07/27/17  Yes Weaver, Scott T, PA-C  omeprazole (PRILOSEC) 40 MG capsule Take 30- 60 min before your first and last meals of the day Patient taking differently: Take 40 mg by mouth 2 (two) times daily.  01/13/18  Yes Tanda Rockers, MD  potassium chloride SA (KLOR-CON M20) 20 MEQ tablet Take 1 tablet (20 mEq total) by mouth daily. Please make overdue appt with Dr. Harrington Challenger before anymore refills. 1st attempt 10/12/18  Yes Weaver, Scott T, PA-C  rosuvastatin (CRESTOR) 20 MG tablet Take 1 tablet (20 mg total) by mouth daily. 07/04/19 10/02/19 Yes Luetta Nutting, DO  topiramate (TOPAMAX) 25 MG tablet Take 1 tablet (25 mg total) by mouth 2 (two) times daily. 07/04/19  Yes Luetta Nutting, DO  valsartan (DIOVAN) 40 MG tablet Take 40 mg by mouth daily.  02/23/19  Yes [provider]  Blood Glucose Monitoring Suppl (ACCU-CHEK AVIVA PLUS) w/Device KIT Patient is to test four times daily. Dx. O06.00 11/03/18   Luetta Nutting, DO  glucose blood [facility] VERIO) test strip For E11.22 07/11/19   Luetta Nutting, DO  Insulin Glargine, 1 Unit Dial, (TOUJEO SOLOSTAR) 300 UNIT/ML SOPN Inject 120 Units into the skin daily. Patient not taking: Reported on 08/16/2019 07/04/19 10/02/19  Luetta Nutting, DO  varenicline (CHANTIX STARTING MONTH PAK) 0.5 MG X 11 & 1 MG X 42 tablet Take one 0.5 mg tab po once daily for 3 days, then increase to one 0.5 mg tab bid  for 4 days, then increase to one 1 mg tab bid. 05/19/19   Luetta Nutting, DO  Verapamil HCl CR 300 MG CP24 TAKE 1 CAPSULE (300 MG TOTAL) BY MOUTH DAILY. PLEASE CALL TO SCHEDULE OVERDUE APPT WITH DR ROSS 08/19/19   Luetta Nutting, DO    Past Medical History: Past Medical History:  Diagnosis Date  . Anxiety    doesn't take any meds for this  . Arteriosclerosis of coronary artery 08/31/2011   Myoview 9/12: EF 44, no ischemia // Myoview 8/18: EF 57, low risk, possible small area of inferior ischemia // Minor nonobstructive CAD by cardiac catheterization - LHC 8/18: Proximal LAD 35, OM2 25  . Arthritis    knuckles  . Asthma   . Carotid  artery disease ([place])    Carotid US 7/14: Bilateral ICA 40-59 // Carotid US ([facility]) 2/17: bilat plaque without sig stenosis  . Chronic diastolic CHF (congestive heart failure) ([place]) 07/26/2017   Echo 7/18:Severe conc LVH, EF 60-65, no RWMA, Gr 2 DD, MAC, mild MR // right and left heart cath 8/18: LVEDP 21  . Claustrophobia 07/26/2012  . COPD (chronic obstructive pulmonary disease) ([place])    "chronic bronchitis - about every winter"  . CSF leak    dizziness/headache - assoc symptoms // 2/2 encephalocele s/p skull base reconstruction  . Diverticulosis   . History of CVA (cerebrovascular accident) 03/2004   denies residual 07/26/2012  . Hx of gout   . Hyperlipidemia    takes Pravastatin daily  . Hypertensive heart disease with chronic diastolic congestive heart failure ([place]) 07/26/2017  . Internal hemorrhoids   . Kidney stones   . LVH (left ventricular hypertrophy)    Echo 8/18: Severe conc LVH, EF 60-65, no RWMA, Gr 2 DD, MAC, mild MR  . OSA (obstructive sleep apnea) 07/26/2012   prior CPAP - stopped due to being primary caregiver for dying parent (stopped in 2016)  . PSVT (paroxysmal supraventricular tachycardia) ([place])   . Pulmonary hypertension, unspecified ([place]) 08/14/2017   cath 07/2017 >>PASP 54m Hg, PA mean 39 mm Hg  . Type II diabetes mellitus ([place]      Past Surgical History: Past Surgical History:  Procedure Laterality Date  . ESOPHAGOGASTRODUODENOSCOPY (EGD) WITH PROPOFOL N/A 07/13/2015   Procedure: ESOPHAGOGASTRODUODENOSCOPY (EGD) WITH PROPOFOL;  Surgeon: PCarol Ada MD;  Location: [facility];  Service: Endoscopy;  Laterality: N/A;  . NASAL SINUS SURGERY  01/29/2012   Procedure: ENDOSCOPIC SINUS SURGERY WITH STEALTH;  Surgeon: MRuby Cola MD;  [facility]  Service: ENT;  Laterality: N/A;  . NM MYOVIEW LTD  08/25/2011   Low risk study, no evidence of ischemia, EF 44%  . REFRACTIVE SURGERY  ~ 2010   left  . RIGHT/LEFT HEART CATH AND CORONARY ANGIOGRAPHY N/A 07/30/2017   Procedure: RIGHT/LEFT HEART CATH AND CORONARY ANGIOGRAPHY;  Surgeon: JMartinique Peter M, MD;  [facility];  Service: Cardiovascular;  Laterality: N/A;  . SPHENOIDECTOMY  01/29/2012   Procedure: SPHENOIDECTOMY;  Surgeon: MRuby Cola MD;  [facility]  Service: ENT;  Laterality: Left;  REPAIR OF LEFT SPHENOID    . TOTAL ABDOMINAL HYSTERECTOMY  1994    Family History: Family History  Problem Relation Age of Onset  . Colon cancer Sister 453 . Breast cancer Mother 684 . Alzheimer's disease Mother   . Diabetes Sister 46 . Heart failure Sister   . Diabetes Sister 477 . Lung cancer Father   . Breast cancer Sister   . Cancer Sister        mouth  . Anesthesia problems Neg Hx   . Hypotension Neg Hx   . Malignant hyperthermia Neg Hx   . Pseudochol deficiency Neg Hx     Social History: Social History   Socioeconomic History  . Marital status: Single    Spouse name: Not on file  . Number of children: 0  . Years of education: Not on file  . Highest education level: Not on file  Occupational History  . Occupation: disabled  Social Needs  . Financial resource strain: Not on file  . Food insecurity    Worry: Not on file    Inability: Not on file  . Transportation needs    Medical: Not  on file    Non-medical: Not on file  Tobacco  Use  . Smoking status: Current Every Day Smoker    Packs/day: 0.50    Years: 35.00    Pack years: 17.50    Types: Cigarettes  . Smokeless tobacco: Never Used  . Tobacco comment: 07/26/2012 has decreased smoking from 2ppd; offered cessation materials; pt declines  Substance and Sexual Activity  . Alcohol use: Yes    Alcohol/week: 1.0 standard drinks    Types: 1 Glasses of wine per week    Comment: rarely  . Drug use: No  . Sexual activity: Never    Birth control/protection: Surgical  Lifestyle  . Physical activity    Days per week: Not on file    Minutes per session: Not on file  . Stress: Not on file  Relationships  . Social Herbalist on phone: Not on file    Gets together: Not on file    Attends religious service: Not on file    Active member of club or organization: Not on file    Attends meetings of clubs or organizations: Not on file    Relationship status: Not on file  Other Topics Concern  . Not on file  Social History Narrative   Currently unemployed.   No children, not married    Allergies:  Allergies  Allergen Reactions  . Atorvastatin Other (See Comments) and Nausea And Vomiting    "legs cramped; moderate to almost severe" "legs cramped; moderate to almost severe"  . Ibuprofen Other (See Comments)    Other reaction(s): Other (See Comments) Per pt, MD doesn't want pt to take Per pt, MD doesn't want pt to take  . Metformin And Related Nausea And Vomiting  . Ace Inhibitors Cough and Nausea And Vomiting  . Hydrocodone-Acetaminophen Nausea And Vomiting  . Albuterol Other (See Comments)    Caused SVT    Objective:    Vital Signs:   Temp:  [97.9 F (36.6 C)-98 F (36.7 C)] 98 F (36.7 C) (09/12 0446) Pulse Rate:  [61-74] 74 (09/12 1345) Resp:  [18-24] 24 (09/12 1345) BP: (81-141)/(48-87) 141/87 (09/12 1345) SpO2:  [91 %-97 %] 92 % (09/12 1345) Weight:  [116.3 kg] 116.3 kg (09/12 0446) Last BM Date: 08/19/19  Weight change: Filed Weights    08/18/19 0533 08/19/19 0053 08/20/19 0446  Weight: 114.3 kg 114.9 kg 116.3 kg    Intake/Output:   Intake/Output Summary (Last 24 hours) at 08/20/2019 1519 Last data filed at 08/20/2019 1300 Gross per 24 hour  Intake 1538.54 ml  Output 550 ml  Net 988.54 ml      Physical Exam    General:  Obese woman. Ill appearing. + orthopnea HEENT: normal Neck: supple. JVP hard to see. + elevated . Carotids 2+ bilat; no bruits. No lymphadenopathy or thyromegaly appreciated. Cor: PMI nonpalpable Regular rate & rhythm. 2/6TR Lungs: markedly decreased BS No wheeze Abdomen: obese soft, nontender, nondistended. No hepatosplenomegaly. No bruits or masses. Good bowel sounds. Extremities: no cyanosis, clubbing, rash, 3+ edema Neuro: alert & orientedx3, cranial nerves grossly intact. moves all 4 extremities w/o difficulty. Affect pleasant   Telemetry   NSR 70-80 Personally reviewed  EKG    NSR 94 RAD ? Anterior Qs low volts. Personally reviewed   Labs   Basic Metabolic Panel: Recent Labs  Lab 08/16/19 1745 08/17/19 0510 08/18/19 0537 08/19/19 0412 08/20/19 0633  NA 144 143 143 143 142  K 4.0 3.3* 3.9 4.0 4.3  CL 103 101 100 103 102  CO2 30 28 32 29 27  GLUCOSE 182* 165* 121* 157* 93  BUN _0 25* 32*  CREATININE 1.72* 1.80* 2.21* 2.64* 2.73*  CALCIUM 9.3 9.2 8.6* 8.2* 8.1*  MG  --  1.7  --   --  1.9    Liver Function Tests: Recent Labs  Lab 08/17/19 0510  AST 20  ALT 12  ALKPHOS 88  BILITOT 1.7*  PROT 6.4*  ALBUMIN 3.6   No results for input(s): LIPASE, AMYLASE in the last 168 hours. No results for input(s): AMMONIA in the last 168 hours.  CBC: Recent Labs  Lab 08/16/19 1745 08/17/19 0510  WBC 7.4 7.6  NEUTROABS  --  5.1  HGB 16.4* 16.2*  HCT 53.2* 52.3*  MCV 94.2 92.7  PLT 184 184    Cardiac Enzymes: No results for input(s): CKTOTAL, CKMB, CKMBINDEX, TROPONINI in the last 168 hours.  BNP: BNP (last 3 results) Recent Labs    08/17/19 0457  BNP  1,427.6*    ProBNP (last 3 results) Recent Labs    05/23/19 1201  PROBNP 1,442.0*     CBG: Recent Labs  Lab 08/19/19 1244 08/19/19 1621 08/19/19 2130 08/20/19 0613 08/20/19 1147  GLUCAP 122* 112* 83 107* 166*    Coagulation Studies: No results for input(s): LABPROT, INR in the last 72 hours.   Imaging    No results found.   Medications:     Current Medications: . allopurinol  100 mg Oral Daily  . [START ON 08/22/2019] aspirin  81 mg Oral Pre-Cath  . aspirin EC  81 mg Oral Daily  . busPIRone  15 mg Oral BID  . Chlorhexidine Gluconate Cloth  6 each Topical Daily  . enoxaparin (LOVENOX) injection  30 mg Subcutaneous Q24H  . FLUoxetine  20 mg Oral QHS  . gabapentin  400 mg Oral TID  . insulin aspart  0-9 Units Subcutaneous TID WC  . insulin glargine  60 Units Subcutaneous Daily  . isosorbide mononitrate  60 mg Oral Daily  . nicotine  21 mg Transdermal Daily  . pantoprazole  40 mg Oral Daily  . potassium chloride SA  20 mEq Oral Daily  . rosuvastatin  20 mg Oral Daily  . topiramate  25 mg Oral BID     Infusions: . sodium chloride    . [START ON 08/22/2019] sodium chloride    . cefTRIAXone (ROCEPHIN)  IV 1 g (08/20/19 1141)       Assessment/Plan   1. Cardiogenic - suspect R>>L but LVEF newly depressed - has developed cardiorenal syndrome. Will plan RHC. Suspect she will need inotropes  2. Acute biventricular HF  - echo EF 25-30%. RV severely HK  - EF newly down. Troponin normal. Clearly not ACS - Cath 2018 with minimal CAD - Will not repeat coronary angin currently with AKI - Place swan. Start inotropes - Need to consider amyloid  3. Acute on chronic hypoxic respiratory failure - multifactorial  - continue O2. Diurese  4. PAH with /cor pulmonale - likely who GROUP II & III  5. AKI - baseline creatinine 1.3-1.5 - now 2.73 due to cardiorenal syndrome - hopefully with intoropes - DM and HTN also likely contributing - avoid nephrotoxic  meds - Renal following  6. COPD with ongoing tobacco use - discussed need to quit  7. Morbid obesity - need weight loss  8. OSA - needs CPA   9. DM2 - continue SSI  CRITICAL  CARE Performed by: Glori Bickers  Total critical care time: 55 minutes  Critical care time was exclusive of separately billable procedures and treating other patients.  Critical care was necessary to treat or prevent imminent or life-threatening deterioration.  Critical care was time spent personally by me (independent of midlevel providers or residents) on the following activities: development of treatment plan with patient and/or surrogate as well as nursing, discussions with consultants, evaluation of patient's response to treatment, examination of patient, obtaining history from patient or surrogate, ordering and performing treatments and interventions, ordering and review of laboratory studies, ordering and review of radiographic studies, pulse oximetry and re-evaluation of patient's condition.  Length of Stay: [place], MD  08/20/2019, 3:19 PM  Advanced Heart Failure Team Pager [phone] (M-F; 7a - 4p)  Please contact [facility] for night-coverage after hours (4p -7a ) and weekends on [email or web address]

## 2019-08-20 NOTE — Progress Notes (Signed)
MD made aware of 32 beats of VTach at reported by CCMD at 0837hrs.

## 2019-08-20 NOTE — Progress Notes (Addendum)
  Request seen for PICC placement.  Creatinine 2.73 so would avoid arm PICC unless patient will never be a long term hemodialysis candidate.  Will plan for tunneled central catheter on Monday for long term Milrinone.  If central line needed over the weekend, please consult CCM for bedside placement.  WENDY S BLAIR PA-C 08/20/2019 12:05 PM

## 2019-08-20 NOTE — Progress Notes (Signed)
PROGRESS NOTE                                                                                                                                                                                                             Patient Demographics:    [name], is a 63 y.o. female, DOB - 06/24/1956, IRW:[id number]  Admit date - 08/16/2019   Admitting Physician Rise Patience, MD  Outpatient Primary MD for the patient is Luetta Nutting, DO  LOS - 4   Chief Complaint  Patient presents with  . Shortness of Breath  . Chest Pain       Brief Narrative    63 y.o. female with history of chronic diastolic CHF, hypertension, chronic kidney disease, pulmonary hypertension diabetes mellitus, CVA, CSF rhinorrhea status post reconstruction surgery sleep apnea and ongoing tobacco abuse presents to the ER because of increasing shortness of breath over the last 3 weeks, work-up significant for volume overload, admitted for IV diuresis.  Patient was on IV Lasix initially, but this has been held as her creatinine kept trending up despite holding her IV diuresis, cardiology were consulted to assist with diuresis, and renal consulted to assist with worsening renal function.   Subjective:    [name] today reports some dyspnea, denies any chest pain, fever or chills .    Assessment  & Plan :    Principal Problem:   Acute diastolic CHF (congestive heart failure) (HCC) Active Problems:   Essential hypertension   Morbid obesity due to excess calories (HCC)   HLD (hyperlipidemia)   OSA (obstructive sleep apnea)   COPD  GOLD 0 still smoking   Pulmonary hypertension, unspecified (HCC)   Type 2 diabetes mellitus with stage 3 chronic kidney disease, with long-term current use of insulin (HCC)  Acute on chronic combined systolic/diastolic CHF, and RV failure. -Most recent echo and 2018 showing EF was 60 to 65% with grade 2 diastolic dysfunction, repeat echo  this admission showing EF 30-35% w/ diffuse hypokinesis, severe concentric LVH and severely educed RV systolic function.  -Diuresis has been currently on hold in the setting of worsening renal function. -Regimen per cardiology, no beta-blockers currently given low cardiac output, No ACE/ARB/ARNI due to renal function, patient will be transferred to stepdown unit, for central line insertion, co-ox monitoring and possible initiation of inotropic agents. -  Monitor ins and outs closely, daily weight -Patient with 32 beats of NSVT, cannot initiate beta-blockers due to low output acute heart failure, I within normal limit, will continue to monitor on telemetry   AKI on Chronic kidney disease stage III  -Baseline creatinine around 1.5, continue to trend up despite holding diuresis, it is 2.7 today -Avoid nephrotoxic medication  -Renal input greatly appreciated -Possibly related to cardiorenal syndrome  Hypertensive urgency -patient blood pressure  elevated in ED. -Currently blood pressure on the lower side.  Diabetes mellitus type 2 -Patient on significant Lantus dose at home 120 units subcu daily, she has been hypoglycemic, her CBG currently controlled on 60 units twice daily and sliding scale .  Sleep apnea on CPAP.  COPD  - not actively wheezing continue inhalers.   Tobacco abuse  - advised about quitting.  Hyperlipidemia on statins.  History of CVA  - on statins and antiplatelet agents.  History of gout  - on allopurinol.  History of depression and anxiety  - on Prozac and BuSpar.   COVID-19 Labs  No results for input(s): DDIMER, FERRITIN, LDH, CRP in the last 72 hours.  Lab Results  Component Value Date   Dane NEGATIVE 08/17/2019     Code Status : Full code  Family Communication  : Discussed with patient  Disposition Plan  : Home once stable  Consults  : cardiology, Nephrology.  Procedures  : None  DVT Prophylaxis  : Subcu Lovenox  Lab Results   Component Value Date   PLT 184 08/17/2019    Antibiotics  :    Anti-infectives (From admission, onward)   Start     Dose/Rate Route Frequency Ordered Stop   08/19/19 0815  cefTRIAXone (ROCEPHIN) 1 g in sodium chloride 0.9 % 100 mL IVPB     1 g 200 mL/hr over 30 Minutes Intravenous Every 24 hours 08/19/19 0814          Objective:   Vitals:   08/19/19 1941 08/20/19 0446 08/20/19 0834 08/20/19 1345  BP: 110/64 119/62 111/63 (!) 141/87  Pulse: 66 69 71 74  Resp: 18 18  (!) 24  Temp: 97.9 F (36.6 C) 98 F (36.7 C)    TempSrc: Oral Oral    SpO2: 97% 91%  92%  Weight:  116.3 kg    Height:        Wt Readings from Last 3 Encounters:  08/20/19 116.3 kg  08/16/19 116.6 kg  07/04/19 113.3 kg     Intake/Output Summary (Last 24 hours) at 08/20/2019 1430 Last data filed at 08/20/2019 1300 Gross per 24 hour  Intake 1538.54 ml  Output 550 ml  Net 988.54 ml     Physical Exam  Awake Alert, Oriented X 3, No new F.N deficits, Normal affect Symmetrical Chest wall movement, Good air movement bilaterally, CTAB RRR,No Gallops,Rubs or new Murmurs, No Parasternal Heave +ve B.Sounds, Abd Soft, No tenderness, No rebound - guarding or rigidity. No Cyanosis, Clubbing o,+2 edema, No new Rash or bruise         Data Review:    CBC Recent Labs  Lab 08/16/19 1745 08/17/19 0510  WBC 7.4 7.6  HGB 16.4* 16.2*  HCT 53.2* 52.3*  PLT 184 184  MCV 94.2 92.7  MCH 29.0 28.7  MCHC 30.8 31.0  RDW 16.7* 16.5*  LYMPHSABS  --  1.8  MONOABS  --  0.4  EOSABS  --  0.2  BASOSABS  --  0.0    Chemistries  Recent  Labs  Lab 08/16/19 1745 08/17/19 0510 08/18/19 0537 08/19/19 0412 08/20/19 0633  NA 144 143 143 143 142  K 4.0 3.3* 3.9 4.0 4.3  CL 103 101 100 103 102  CO2 30 28 32 29 27  GLUCOSE 182* 165* 121* 157* 93  BUN 18 19 20  25* 32*  CREATININE 1.72* 1.80* 2.21* 2.64* 2.73*  CALCIUM 9.3 9.2 8.6* 8.2* 8.1*  MG  --  1.7  --   --  1.9  AST  --  20  --   --   --   ALT  --  12   --   --   --   ALKPHOS  --  88  --   --   --   BILITOT  --  1.7*  --   --   --    ------------------------------------------------------------------------------------------------------------------ No results for input(s): CHOL, HDL, LDLCALC, TRIG, CHOLHDL, LDLDIRECT in the last 72 hours.  Lab Results  Component Value Date   HGBA1C 8.2 (H) 05/23/2019   ------------------------------------------------------------------------------------------------------------------ No results for input(s): TSH, T4TOTAL, T3FREE, THYROIDAB in the last 72 hours.  Invalid input(s): FREET3 ------------------------------------------------------------------------------------------------------------------ No results for input(s): VITAMINB12, FOLATE, FERRITIN, TIBC, IRON, RETICCTPCT in the last 72 hours.  Coagulation profile No results for input(s): INR, PROTIME in the last 168 hours.  No results for input(s): DDIMER in the last 72 hours.  Cardiac Enzymes No results for input(s): CKMB, TROPONINI, MYOGLOBIN in the last 168 hours.  Invalid input(s): CK ------------------------------------------------------------------------------------------------------------------    Component Value Date/Time   BNP 1,427.6 (H) 08/17/2019 0457   BNP 43.7 11/07/2011 1436    Inpatient Medications  Scheduled Meds: . fentaNYL      . midazolam      . allopurinol  100 mg Oral Daily  . aspirin EC  81 mg Oral Daily  . busPIRone  15 mg Oral BID  . Chlorhexidine Gluconate Cloth  6 each Topical Daily  . enoxaparin (LOVENOX) injection  30 mg Subcutaneous Q24H  . FLUoxetine  20 mg Oral QHS  . gabapentin  400 mg Oral TID  . insulin aspart  0-9 Units Subcutaneous TID WC  . insulin glargine  60 Units Subcutaneous Daily  . isosorbide mononitrate  60 mg Oral Daily  . nicotine  21 mg Transdermal Daily  . pantoprazole  40 mg Oral Daily  . potassium chloride SA  20 mEq Oral Daily  . rosuvastatin  20 mg Oral Daily  . topiramate   25 mg Oral BID   Continuous Infusions: . cefTRIAXone (ROCEPHIN)  IV 1 g (08/20/19 1141)   PRN Meds:.acetaminophen **OR** acetaminophen, albuterol, hydrALAZINE, nitroGLYCERIN, ondansetron **OR** ondansetron (ZOFRAN) IV  Micro Results Recent Results (from the past 240 hour(s))  SARS Coronavirus 2 [facility] order, Performed in [facility] hospital lab) Nasopharyngeal Nasopharyngeal Swab     Status: None   Collection Time: 08/17/19 12:38 AM   Specimen: Nasopharyngeal Swab  Result Value Ref Range Status   SARS Coronavirus 2 NEGATIVE NEGATIVE Final    Comment: (NOTE) If result is NEGATIVE SARS-CoV-2 target nucleic acids are NOT DETECTED. The SARS-CoV-2 RNA is generally detectable in upper and lower  respiratory specimens during the acute phase of infection. The lowest  concentration of SARS-CoV-2 viral copies this assay can detect is 250  copies / mL. A negative result does not preclude SARS-CoV-2 infection  and should not be used as the sole basis for treatment or other  patient management decisions.  A negative result may occur with  improper specimen  collection / handling, submission of specimen other  than nasopharyngeal swab, presence of viral mutation(s) within the  areas targeted by this assay, and inadequate number of viral copies  (<250 copies / mL). A negative result must be combined with clinical  observations, patient history, and epidemiological information. If result is POSITIVE SARS-CoV-2 target nucleic acids are DETECTED. The SARS-CoV-2 RNA is generally detectable in upper and lower  respiratory specimens dur ing the acute phase of infection.  Positive  results are indicative of active infection with SARS-CoV-2.  Clinical  correlation with patient history and other diagnostic information is  necessary to determine patient infection status.  Positive results do  not rule out bacterial infection or co-infection with other viruses. If result is PRESUMPTIVE POSTIVE  SARS-CoV-2 nucleic acids MAY BE PRESENT.   A presumptive positive result was obtained on the submitted specimen  and confirmed on repeat testing.  While 2019 novel coronavirus  (SARS-CoV-2) nucleic acids may be present in the submitted sample  additional confirmatory testing may be necessary for epidemiological  and / or clinical management purposes  to differentiate between  SARS-CoV-2 and other Sarbecovirus currently known to infect humans.  If clinically indicated additional testing with an alternate test  methodology [phone]) is advised. The SARS-CoV-2 RNA is generally  detectable in upper and lower respiratory sp ecimens during the acute  phase of infection. The expected result is Negative. Fact Sheet for Patients:  StrictlyIdeas.no Fact Sheet for Healthcare Providers: BankingDealers.co.za This test is not yet approved or cleared by the [place] FDA and has been authorized for detection and/or diagnosis of SARS-CoV-2 by FDA under an Emergency Use Authorization (EUA).  This EUA will remain in effect (meaning this test can be used) for the duration of the COVID-19 declaration under Section 564(b)(1) of the Act, 21 U.S.C. section 360bbb-3(b)(1), unless the authorization is terminated or revoked sooner. Performed at [facility], [place] [address] [place], [place] [address]     Radiology Reports Dg Chest 2 View  Result Date: 08/16/2019 CLINICAL DATA:  63 year old female with chest pain and shortness of breath. EXAM: CHEST - 2 VIEW COMPARISON:  Chest radiograph dated 01/05/2019 FINDINGS: Small left pleural effusion and left lung base atelectasis versus infiltrate. Overall slight interval increase in the size of the left pleural effusion and associated left lung base opacity compared to the prior radiograph. The right lung remains clear. No pneumothorax. Stable cardiac silhouette. No acute osseous pathology. Degenerative changes of  the spine. IMPRESSION: Small left pleural effusion and left lung base atelectasis versus infiltrate, increased in size since the prior radiograph. Electronically Signed   By: Anner Crete M.D.   On: 08/16/2019 19:01     Phillips Climes M.D on 08/20/2019 at 2:30 PM  Between 7am to 7pm - Pager - [phone]  After 7pm go to [email or web address] - password [email or web address]  [facility] -  Office  [phone]

## 2019-08-20 NOTE — Progress Notes (Signed)
[facility]    NEPHROLOGY PROGRESS NOTE  SUBJECTIVE: Continues to feel much better today.  Denies chest pain, shortness of breath, nausea, vomiting, diarrhea or dysuria.  Does have some dyspnea on exertion.  Continues to have lower extremity edema but improved.  All other review of systems are negative.     OBJECTIVE:  Vitals:   08/20/19 0446 08/20/19 0834  BP: 119/62 111/63  Pulse: 69 71  Resp: 18   Temp: 98 F (36.7 C)   SpO2: 91%     Intake/Output Summary (Last 24 hours) at 08/20/2019 0933 Last data filed at 08/20/2019 3149 Gross per 24 hour  Intake 1211 ml  Output 550 ml  Net 661 ml      General:  AAOx3 NAD HEENT: MMM [place] AT anicteric sclera Neck:  No JVD, no adenopathy CV:  Heart RRR  Lungs:  L/S CTA bilaterally Abd:  abd SNT/ND with normal BS, obese GU:  Bladder non-palpable Extremities: +3 bilateral lower extremity edema Skin:  No skin rash  MEDICATIONS:  . allopurinol  100 mg Oral Daily  . aspirin EC  81 mg Oral Daily  . busPIRone  15 mg Oral BID  . enoxaparin (LOVENOX) injection  30 mg Subcutaneous Q24H  . FLUoxetine  20 mg Oral QHS  . gabapentin  400 mg Oral TID  . insulin aspart  0-9 Units Subcutaneous TID WC  . insulin glargine  60 Units Subcutaneous Daily  . isosorbide mononitrate  60 mg Oral Daily  . nicotine  21 mg Transdermal Daily  . pantoprazole  40 mg Oral Daily  . potassium chloride SA  20 mEq Oral Daily  . rosuvastatin  20 mg Oral Daily  . topiramate  25 mg Oral BID       LABS:   CBC Latest Ref Rng & Units 08/17/2019 08/16/2019 01/05/2019  WBC 4.0 - 10.5 K/uL 7.6 7.4 8.3  Hemoglobin 12.0 - 15.0 g/dL 16.2(H) 16.4(H) 16.0(H)  Hematocrit 36.0 - 46.0 % 52.3(H) 53.2(H) 48.4(H)  Platelets 150 - 400 K/uL 184 184 215.0    CMP Latest Ref Rng & Units 08/20/2019 08/19/2019 08/18/2019  Glucose 70 - 99 mg/dL 93 157(H) 121(H)  BUN 8 - 23 mg/dL 32(H) 25(H) 20  Creatinine 0.44 - 1.00 mg/dL 2.73(H) 2.64(H) 2.21(H)  Sodium 135 - 145  mmol/L 142 143 143  Potassium 3.5 - 5.1 mmol/L 4.3 4.0 3.9  Chloride 98 - 111 mmol/L 102 103 100  CO2 22 - 32 mmol/L 27 29 32  Calcium 8.9 - 10.3 mg/dL 8.1(L) 8.2(L) 8.6(L)  Total Protein 6.5 - 8.1 g/dL - - -  Total Bilirubin 0.3 - 1.2 mg/dL - - -  Alkaline Phos 38 - 126 U/L - - -  AST 15 - 41 U/L - - -  ALT 0 - 44 U/L - - -    Lab Results  Component Value Date   CALCIUM 8.1 (L) 08/20/2019   CAION 1.18 09/30/2013   PHOS 3.8 01/27/2012       Component Value Date/Time   COLORURINE AMBER (A) 08/18/2019 2150   APPEARANCEUR CLOUDY (A) 08/18/2019 2150   LABSPEC 1.017 08/18/2019 2150   PHURINE 5.0 08/18/2019 2150   GLUCOSEU NEGATIVE 08/18/2019 2150   HGBUR MODERATE (A) 08/18/2019 2150   BILIRUBINUR NEGATIVE 08/18/2019 2150   BILIRUBINUR 1+ 02/07/2019 1433   KETONESUR NEGATIVE 08/18/2019 2150   PROTEINUR >=300 (A) 08/18/2019 2150   UROBILINOGEN 0.2 02/07/2019 1433   UROBILINOGEN 0.2 07/07/2013 1353   NITRITE POSITIVE (  A) 08/18/2019 2150   LEUKOCYTESUR LARGE (A) 08/18/2019 2150      Component Value Date/Time   PHART 7.420 07/30/2017 1338   PCO2ART 45.0 07/30/2017 1338   PO2ART 59.0 (L) 07/30/2017 1338   HCO3 33.0 (H) 07/30/2017 1339   TCO2 35 07/30/2017 1339   ACIDBASEDEF 0.8 12/20/2008 0130   O2SAT 55.0 07/30/2017 1339    No results found for: IRON, TIBC, FERRITIN, IRONPCTSAT     2D echocardiogram from 08/18/2019: Right ventricular with severely reduced systolic function.  Large pleural effusion in the left lateral region.  Severe concentric LVH.  Ejection fraction 30 to 35%.  ASSESSMENT/PLAN:    1.  Chronic kidney disease stage III.  Her baseline serum creatinine appears to run in the mid ones.  Will check urine protein to creatinine ratio.    Prior history of around 100 mg of microalbuminuria.  Does have underlying diabetes.  2.  Acute kidney injury.  Likely secondary to diuresis with low blood pressure.  Does not appear to be over diuresed.  Suspect the low blood  pressure is playing a large role.  Will be tricky to continue to diurese effectively in the setting of biventricular heart failure.  Agree with holding diuretics today and reassessing again tomorrow.  Slight worsening of renal function today, but overall trend appears to be plateauing.  3.  Hypertensive urgency.  Blood pressures elevated on admission, now currently on the low side.    Off verapamil.  4.  Diabetes mellitus.  Changes per primary team.  5.  Acute on chronic systolic and diastolic congestive heart failure.  Suspect she has a significant opponent of right-sided heart failure.  Continue to diurese as able.  Continue CPAP support for obstructive sleep apnea. Will need to be on maintenance diuretics despite low blood pressures once renal function improves.  Agree with cardiology plan.  Concern for low output heart failure.    [place], DO, [place]

## 2019-08-20 NOTE — Progress Notes (Signed)
Patient received from 3 E via wheelchair. Patient anxious, speaking about what will happen. Explained about ICu routine and discussed heart healthy diet. Dr Haroldine Laws into speak about procedure, consent obtained. Time out performed. Medicated per orders., patient tolerated procedure well. On 4 LPM nurse and RT at bedside.  Report given to [facility]

## 2019-08-20 NOTE — Progress Notes (Signed)
Patient report given to RN [facility] [place]

## 2019-08-21 DIAGNOSIS — G4733 Obstructive sleep apnea (adult) (pediatric): Secondary | ICD-10-CM

## 2019-08-21 LAB — BASIC METABOLIC PANEL
Anion gap: 12 (ref 5–15)
BUN: 30 mg/dL — ABNORMAL HIGH (ref 8–23)
CO2: 30 mmol/L (ref 22–32)
Calcium: 7.9 mg/dL — ABNORMAL LOW (ref 8.9–10.3)
Chloride: 100 mmol/L (ref 98–111)
Creatinine, Ser: 2.27 mg/dL — ABNORMAL HIGH (ref 0.44–1.00)
GFR calc Af Amer: 26 mL/min — ABNORMAL LOW (ref >60)
GFR calc non Af Amer: 22 mL/min — ABNORMAL LOW (ref >60)
Glucose, Bld: 112 mg/dL — ABNORMAL HIGH (ref 70–99)
Potassium: 4 mmol/L (ref 3.5–5.1)
Sodium: 142 mmol/L (ref 135–145)

## 2019-08-21 LAB — GLUCOSE, CAPILLARY
Glucose-Capillary: 109 mg/dL — ABNORMAL HIGH (ref 70–99)
Glucose-Capillary: 123 mg/dL — ABNORMAL HIGH (ref 70–99)
Glucose-Capillary: 161 mg/dL — ABNORMAL HIGH (ref 70–99)
Glucose-Capillary: 166 mg/dL — ABNORMAL HIGH (ref 70–99)

## 2019-08-21 LAB — CBC
HCT: 49.8 % — ABNORMAL HIGH (ref 36.0–46.0)
Hemoglobin: 15.3 g/dL — ABNORMAL HIGH (ref 12.0–15.0)
MCH: 29.3 pg (ref 26.0–34.0)
MCHC: 30.7 g/dL (ref 30.0–36.0)
MCV: 95.4 fL (ref 80.0–100.0)
Platelets: 173 10*3/uL (ref 150–400)
RBC: 5.22 MIL/uL — ABNORMAL HIGH (ref 3.87–5.11)
RDW: 16.5 % — ABNORMAL HIGH (ref 11.5–15.5)
WBC: 10.1 10*3/uL (ref 4.0–10.5)
nRBC: 0 % (ref 0.0–0.2)

## 2019-08-21 LAB — COOXEMETRY PANEL
Carboxyhemoglobin: 2.2 % — ABNORMAL HIGH (ref 0.5–1.5)
Methemoglobin: 1 % (ref 0.0–1.5)
O2 Saturation: 80.1 %
Total hemoglobin: 15.8 g/dL (ref 12.0–16.0)

## 2019-08-21 MED ORDER — SODIUM CHLORIDE 0.9% FLUSH
3.0000 mL | Freq: Two times a day (BID) | INTRAVENOUS | Status: DC
  Administered 2019-08-21 – 2019-08-26 (×10): 3 mL via INTRAVENOUS

## 2019-08-21 NOTE — Progress Notes (Signed)
Advanced Heart Failure Rounding Note   Subjective:    Swan placed yesterday. Milrinone started for low output. Lasix gtt started at 15.   Excellent diuresis overnight. Weight reported as unchanged however. Creatinine improved.   Breathing better. Orthopnea improving. No PND. No CP.   Swan numbers (done personally with RN)   - on milrinone 0.25  CVP 7 PA 59/24 (36) PCWP 13 Thermo 7.0/3.4 SVR 900 PVR 3.0  Objective:   Weight Range:  Vital Signs:   Temp:  [96.4 F (35.8 C)-98.4 F (36.9 C)] 97.9 F (36.6 C) (09/13 1012) Pulse Rate:  [42-95] 91 (09/13 1012) Resp:  [16-29] 24 (09/13 1012) BP: (70-162)/(39-121) 70/40 (09/13 1000) SpO2:  [87 %-100 %] 94 % (09/13 1012) Weight:  [116.2 kg] 116.2 kg (09/13 0500) Last BM Date: 08/19/19  Weight change: Filed Weights   08/19/19 0053 08/20/19 0446 08/21/19 0500  Weight: 114.9 kg 116.3 kg 116.2 kg    Intake/Output:   Intake/Output Summary (Last 24 hours) at 08/21/2019 1023 Last data filed at 08/21/2019 0800 Gross per 24 hour  Intake 948.12 ml  Output 2550 ml  Net -1601.88 ml     Physical Exam: General:  Obese woman lying in bed. No resp difficulty HEENT: normal Neck: supple. RIJ swan Carotids 2+ bilat; no bruits. No lymphadenopathy or thryomegaly appreciated. Cor: PMI nondisplaced. Regular rate & rhythm. No rubs, gallops or murmurs. Lungs: clear Abdomen: obese soft, nontender, nondistended. No hepatosplenomegaly. No bruits or masses. Good bowel sounds. Extremities: no cyanosis, clubbing, rash, edema Neuro: alert & orientedx3, cranial nerves grossly intact. moves all 4 extremities w/o difficulty. Affect pleasant  Telemetry: NSR 90 Personally reviewed   Labs: Basic Metabolic Panel: Recent Labs  Lab 08/17/19 0510 08/18/19 0537 08/19/19 0412 08/20/19 0633 08/21/19 0527  NA 143 143 143 142 142  K 3.3* 3.9 4.0 4.3 4.0  CL 101 100 103 102 100  CO2 28 32 29 27 30   GLUCOSE 165* 121* 157* 93 112*  BUN 19 20  25* 32* 30*  CREATININE 1.80* 2.21* 2.64* 2.73* 2.27*  CALCIUM 9.2 8.6* 8.2* 8.1* 7.9*  MG 1.7  --   --  1.9  --     Liver Function Tests: Recent Labs  Lab 08/17/19 0510  AST 20  ALT 12  ALKPHOS 88  BILITOT 1.7*  PROT 6.4*  ALBUMIN 3.6   No results for input(s): LIPASE, AMYLASE in the last 168 hours. No results for input(s): AMMONIA in the last 168 hours.  CBC: Recent Labs  Lab 08/16/19 1745 08/17/19 0510 08/21/19 0527  WBC 7.4 7.6 10.1  NEUTROABS  --  5.1  --   HGB 16.4* 16.2* 15.3*  HCT 53.2* 52.3* 49.8*  MCV 94.2 92.7 95.4  PLT 184 184 173    Cardiac Enzymes: No results for input(s): CKTOTAL, CKMB, CKMBINDEX, TROPONINI in the last 168 hours.  BNP: BNP (last 3 results) Recent Labs    08/17/19 0457  BNP 1,427.6*    ProBNP (last 3 results) Recent Labs    05/23/19 1201  PROBNP 1,442.0*      Other results:  Imaging: Dg Chest Port 1 View  Result Date: 08/20/2019 CLINICAL DATA:  Central line placement EXAM: PORTABLE CHEST 1 VIEW COMPARISON:  08/16/2019, 01/05/2019 FINDINGS: Insertion of right IJ Swan-Ganz catheter, tip projects over right lower lobe pulmonary artery. Negative for right pneumothorax. Suspected left pleural effusion and left basilar airspace disease. Possible small right effusion. Aortic atherosclerosis. Cardiomegaly. IMPRESSION: 1. Right IJ Swan-Ganz catheter  tip projects over right lower lobe pulmonary artery and may be withdrawn for more optimal positioning. Negative for right pneumothorax 2. Cardiomegaly with left pleural effusion and left basilar airspace disease. Probable small right pleural effusion. Electronically Signed   By: Donavan Foil M.D.   On: 08/20/2019 15:59      Medications:     Scheduled Medications: . allopurinol  100 mg Oral Daily  . [START ON 08/22/2019] aspirin  81 mg Oral Pre-Cath  . aspirin EC  81 mg Oral Daily  . busPIRone  15 mg Oral BID  . Chlorhexidine Gluconate Cloth  6 each Topical Daily  . enoxaparin  (LOVENOX) injection  30 mg Subcutaneous Q24H  . FLUoxetine  20 mg Oral QHS  . gabapentin  400 mg Oral TID  . insulin aspart  0-9 Units Subcutaneous TID WC  . insulin glargine  60 Units Subcutaneous Daily  . mouth rinse  15 mL Mouth Rinse BID  . nicotine  21 mg Transdermal Daily  . pantoprazole  40 mg Oral Daily  . potassium chloride SA  20 mEq Oral Daily  . rosuvastatin  20 mg Oral Daily  . topiramate  25 mg Oral BID     Infusions: . sodium chloride    . [START ON 08/22/2019] sodium chloride    . cefTRIAXone (ROCEPHIN)  IV 1 g (08/21/19 0926)  . furosemide (LASIX) infusion 15 mg/hr (08/21/19 0800)  . milrinone 0.25 mcg/kg/min (08/21/19 0800)     PRN Medications:  sodium chloride, acetaminophen **OR** acetaminophen, albuterol, hydrALAZINE, nitroGLYCERIN, ondansetron **OR** ondansetron (ZOFRAN) IV, sodium chloride flush   Assessment/Plan:   1. Cardiogenic shock  - initial swan numbers confirmed biventricular failure with low CI @ 1.6 - Improved with milrinone. Co-ox this am 80%  2. Acute biventricular HF  - echo EF 25-30%. RV severely HK  - EF newly down. Troponin normal. Clearly not ACS - Cath 2018 with minimal CAD - Will not repeat coronary angio currently with AKI - initial swan numbers confirmed biventricular failure with low CI @ 1.6. much improved with milrinone and diuresis.  - Will continue milrinone at 0.25 today. Begin wean tomorrow. Continue diuresis one more day. Drop lasix gtt to 8 - Need to consider amyloid. Possible PYP soon   3. Acute on chronic hypoxic respiratory failure - multifactorial  - continue O2. Diurese  4. PAH with /cor pulmonale - likely who GROUP II & III  5. AKI - baseline creatinine 1.4-1.7 - Creatinine peaked at 2.73. Improved with milrinone and diuresis -> 2.27 due to cardiorenal syndrome - DM and HTN also likely contributing - avoid nephrotoxic meds - D/w Renal at bedside  6. COPD with ongoing tobacco use - discussed need  to quit  7. Morbid obesity - need weight loss  8. OSA - needs CPA   9. DM2 - continue SSI  CRITICAL CARE Performed by: Glori Bickers  Total critical care time: 40 minutes  Critical care time was exclusive of separately billable procedures and treating other patients.  Critical care was necessary to treat or prevent imminent or life-threatening deterioration.  Critical care was time spent personally by me (independent of midlevel providers or residents) on the following activities: development of treatment plan with patient and/or surrogate as well as nursing, discussions with consultants, evaluation of patient's response to treatment, examination of patient, obtaining history from patient or surrogate, ordering and performing treatments and interventions, ordering and review of laboratory studies, ordering and review of radiographic studies, pulse oximetry and  re-evaluation of patient's condition.   Length of Stay: 5   Glori Bickers MD 08/21/2019, 10:23 AM  Advanced Heart Failure Team Pager [phone] (M-F; [place])  Please contact [facility] for night-coverage after hours (4p -7a ) and weekends on [email or web address]

## 2019-08-21 NOTE — Plan of Care (Signed)
  Problem: Clinical Measurements: Goal: Ability to maintain clinical measurements within normal limits will improve Outcome: Progressing   Problem: Nutrition: Goal: Adequate nutrition will be maintained Outcome: Progressing   Problem: Elimination: Goal: Will not experience complications related to urinary retention Outcome: Progressing   Problem: Cardiac: Goal: Ability to achieve and maintain adequate cardiopulmonary perfusion will improve Outcome: Progressing

## 2019-08-21 NOTE — Progress Notes (Signed)
[facility]    NEPHROLOGY PROGRESS NOTE  SUBJECTIVE: Now in ICU with Swan-Ganz catheter.  On milrinone and Lasix drips.  Large amount of urine output noted per RN.  Denies chest pain, shortness of breath, nausea, vomiting, diarrhea or dysuria.  Does have some dyspnea on exertion.  Continues to have lower extremity edema but improved.  All other review of systems are negative.    OBJECTIVE:  Vitals:   08/21/19 1045 08/21/19 1100  BP: (!) 145/66 (!) 135/53  Pulse: 86 85  Resp: (!) 22 (!) 23  Temp: 97.7 F (36.5 C) 97.7 F (36.5 C)  SpO2: 94% 93%    Intake/Output Summary (Last 24 hours) at 08/21/2019 1147 Last data filed at 08/21/2019 0800 Gross per 24 hour  Intake 948.12 ml  Output 2550 ml  Net -1601.88 ml      General:  AAOx3 NAD HEENT: MMM [place] AT anicteric sclera Neck:  No JVD visible, no adenopathy CV:  Heart RRR  Lungs:  L/S CTA bilaterally Abd:  abd SNT/ND with normal BS, obese GU:  Bladder non-palpable Extremities: +3 bilateral lower extremity edema Skin:  No skin rash  MEDICATIONS:  . allopurinol  100 mg Oral Daily  . [START ON 08/22/2019] aspirin  81 mg Oral Pre-Cath  . aspirin EC  81 mg Oral Daily  . busPIRone  15 mg Oral BID  . Chlorhexidine Gluconate Cloth  6 each Topical Daily  . enoxaparin (LOVENOX) injection  30 mg Subcutaneous Q24H  . FLUoxetine  20 mg Oral QHS  . gabapentin  400 mg Oral TID  . insulin aspart  0-9 Units Subcutaneous TID WC  . insulin glargine  60 Units Subcutaneous Daily  . mouth rinse  15 mL Mouth Rinse BID  . nicotine  21 mg Transdermal Daily  . pantoprazole  40 mg Oral Daily  . potassium chloride SA  20 mEq Oral Daily  . rosuvastatin  20 mg Oral Daily  . topiramate  25 mg Oral BID       LABS:   CBC Latest Ref Rng & Units 08/21/2019 08/17/2019 08/16/2019  WBC 4.0 - 10.5 K/uL 10.1 7.6 7.4  Hemoglobin 12.0 - 15.0 g/dL 15.3(H) 16.2(H) 16.4(H)  Hematocrit 36.0 - 46.0 % 49.8(H) 52.3(H) 53.2(H)  Platelets 150 - 400  K/uL 173 184 184    CMP Latest Ref Rng & Units 08/21/2019 08/20/2019 08/19/2019  Glucose 70 - 99 mg/dL 112(H) 93 157(H)  BUN 8 - 23 mg/dL 30(H) 32(H) 25(H)  Creatinine 0.44 - 1.00 mg/dL 2.27(H) 2.73(H) 2.64(H)  Sodium 135 - 145 mmol/L 142 142 143  Potassium 3.5 - 5.1 mmol/L 4.0 4.3 4.0  Chloride 98 - 111 mmol/L 100 102 103  CO2 22 - 32 mmol/L 30 27 29   Calcium 8.9 - 10.3 mg/dL 7.9(L) 8.1(L) 8.2(L)  Total Protein 6.5 - 8.1 g/dL - - -  Total Bilirubin 0.3 - 1.2 mg/dL - - -  Alkaline Phos 38 - 126 U/L - - -  AST 15 - 41 U/L - - -  ALT 0 - 44 U/L - - -    Lab Results  Component Value Date   CALCIUM 7.9 (L) 08/21/2019   CAION 1.18 09/30/2013   PHOS 3.8 01/27/2012       Component Value Date/Time   COLORURINE AMBER (A) 08/18/2019 2150   APPEARANCEUR CLOUDY (A) 08/18/2019 2150   LABSPEC 1.017 08/18/2019 2150   PHURINE 5.0 08/18/2019 2150   GLUCOSEU NEGATIVE 08/18/2019 2150   HGBUR MODERATE (  A) 08/18/2019 2150   BILIRUBINUR NEGATIVE 08/18/2019 2150   BILIRUBINUR 1+ 02/07/2019 1433   KETONESUR NEGATIVE 08/18/2019 2150   PROTEINUR >=300 (A) 08/18/2019 2150   UROBILINOGEN 0.2 02/07/2019 1433   UROBILINOGEN 0.2 07/07/2013 1353   NITRITE POSITIVE (A) 08/18/2019 2150   LEUKOCYTESUR LARGE (A) 08/18/2019 2150      Component Value Date/Time   PHART 7.420 07/30/2017 1338   PCO2ART 45.0 07/30/2017 1338   PO2ART 59.0 (L) 07/30/2017 1338   HCO3 33.0 (H) 07/30/2017 1339   TCO2 35 07/30/2017 1339   ACIDBASEDEF 0.8 12/20/2008 0130   O2SAT 80.1 08/21/2019 0530    No results found for: IRON, TIBC, FERRITIN, IRONPCTSAT     2D echocardiogram from 08/18/2019: Right ventricular with severely reduced systolic function.  Large pleural effusion in the left lateral region.  Severe concentric LVH.  Ejection fraction 30 to 35%.  ASSESSMENT/PLAN:    1.  Chronic kidney disease stage III.  Her baseline serum creatinine appears to run in the mid ones.  Will check urine protein to creatinine ratio.     Prior history of around 100 mg of microalbuminuria.  Does have underlying diabetes.  2.  Acute kidney injury.    Serum creatinine is improving.  Suspect low output cardiac failure playing a role.  Is diuresing well on Lasix drip with milrinone.  3.  Hypertensive urgency.  Blood pressures elevated on admission, but currently stable.  4.  Diabetes mellitus.  Changes per primary team.  5.  Acute on chronic systolic and diastolic congestive heart failure.    Markedly elevated wedge pressures.  Continue IV diuresis and milrinone.  Weights do not reflect significant diuresis, but will monitor closely on current regimen.   [place], DO, [place]

## 2019-08-21 NOTE — Progress Notes (Addendum)
PROGRESS NOTE                                                                                                                                                                                                             Patient Demographics:    [name], is a 63 y.o. female, DOB - [birth year], LTJ:[id number]  Admit date - 08/16/2019   Admitting Physician Rise Patience, MD  Outpatient Primary MD for the patient is Luetta Nutting, DO  LOS - 5   Chief Complaint  Patient presents with  . Shortness of Breath  . Chest Pain       Brief Narrative    63 y.o. female with history of chronic diastolic CHF, hypertension, chronic kidney disease, pulmonary hypertension diabetes mellitus, CVA, CSF rhinorrhea status post reconstruction surgery sleep apnea and ongoing tobacco abuse presents to the ER because of increasing shortness of breath over the last 3 weeks, work-up significant for volume overload, admitted for IV diuresis.  Patient was on IV Lasix initially, but this has been held as her creatinine kept trending up despite holding her IV diuresis, cardiology were consulted to assist with diuresis, and renal consulted to assist with worsening renal function.  He was transferred to CCU 9/12, for initiation of milrinone drip, and close monitoring with Swan-Ganz.   Subjective:    [name] today reports some dyspnea, denies any chest pain, fever or chills .    Assessment  & Plan :    Principal Problem:   Acute diastolic CHF (congestive heart failure) (HCC) Active Problems:   Essential hypertension   Morbid obesity due to excess calories (HCC)   HLD (hyperlipidemia)   OSA (obstructive sleep apnea)   COPD  GOLD 0 still smoking   Pulmonary hypertension, unspecified (HCC)   Type 2 diabetes mellitus with stage 3 chronic kidney disease, with long-term current use of insulin (HCC)  Acute on chronic combined systolic/diastolic CHF, and RV  failure/cardiogenic shock. -Most recent echo and 2018 showing EF was 60 to 65% with grade 2 diastolic dysfunction, repeat echo this admission showing EF 30-35% w/ diffuse hypokinesis, severe concentric LVH and severely educed RV systolic function.  -Significantly worsening renal function, despite minimal diuresis/holding diuresis. -CHF team input greatly appreciated, patient currently in CCU, secondary to cardiogenic shock, on IV Lasix 15 mg/hour, and on milrinone drip,  guided by Swan-Ganz. -monitor to renal function closely, daily weight, strict ins and out. -Plan for right heart cath in a.m.  AKI on Chronic kidney disease stage III  -Baseline creatinine around 1.5, continue to trend up despite holding diuresis, continued peaked at 2.7, it is improving today, it is 2.2. -Avoid nephrotoxic medication  -Renal input greatly appreciated -Possibly related to cardiorenal syndrome  Hypertensive urgency -patient blood pressure  elevated in ED. -Currently blood pressure on the lower side.  Diabetes mellitus type 2 -Patient on significant Lantus dose at home 120 units subcu daily, she has been hypoglycemic, her CBG currently controlled on 60 units twice daily and sliding scale .  Sleep apnea on CPAP.  UTI -Total of 3 days of Rocephin  COPD  - not actively wheezing continue inhalers.   Tobacco abuse  - advised about quitting.  Hyperlipidemia on statins.  History of CVA  - on statins and antiplatelet agents.  History of gout  - on allopurinol.  History of depression and anxiety  - on Prozac and BuSpar.   COVID-19 Labs  No results for input(s): DDIMER, FERRITIN, LDH, CRP in the last 72 hours.  Lab Results  Component Value Date   [place] NEGATIVE 08/17/2019     Code Status : Full code  Family Communication  : Discussed with patient  Disposition Plan  : Home once stable  Consults  : cardiology, Nephrology.  Procedures  : None  DVT Prophylaxis  : Subcu Lovenox   Lab Results  Component Value Date   PLT 173 08/21/2019    Antibiotics  :    Anti-infectives (From admission, onward)   Start     Dose/Rate Route Frequency Ordered Stop   08/19/19 0815  cefTRIAXone (ROCEPHIN) 1 g in sodium chloride 0.9 % 100 mL IVPB     1 g 200 mL/hr over 30 Minutes Intravenous Every 24 hours 08/19/19 0814          Objective:   Vitals:   08/21/19 1200 08/21/19 1213 08/21/19 1214 08/21/19 1215  BP: (!) 155/68   (!) 156/77  Pulse: 82 (!) 43 85 85  Resp: (!) 26 (!) 26 16 16   Temp: (!) 97.5 F (36.4 C) (!) 97.3 F (36.3 C) (!) 97.3 F (36.3 C) (!) 97.3 F (36.3 C)  TempSrc:      SpO2: 95% 95% 95% 95%  Weight:      Height:        Wt Readings from Last 3 Encounters:  08/21/19 116.2 kg  08/16/19 116.6 kg  07/04/19 113.3 kg     Intake/Output Summary (Last 24 hours) at 08/21/2019 1249 Last data filed at 08/21/2019 1200 Gross per 24 hour  Intake 1254.25 ml  Output 3315 ml  Net -2060.75 ml     Physical Exam  Awake Alert, Oriented X 3, No new F.N deficits, Normal affect, right IJ Swan  Symmetrical Chest wall movement, Good air movement bilaterally, CTAB RRR,No Gallops,Rubs or new Murmurs, No Parasternal Heave +ve B.Sounds, Abd Soft, No tenderness, No rebound - guarding or rigidity. No Cyanosis, Clubbing ,+2 edema, No new Rash or bruise       Data Review:    CBC Recent Labs  Lab 08/16/19 1745 08/17/19 0510 08/21/19 0527  WBC 7.4 7.6 10.1  HGB 16.4* 16.2* 15.3*  HCT 53.2* 52.3* 49.8*  PLT 184 184 173  MCV 94.2 92.7 95.4  MCH 29.0 28.7 29.3  MCHC 30.8 31.0 30.7  RDW 16.7* 16.5* 16.5*  LYMPHSABS  --  1.8  --   MONOABS  --  0.4  --   EOSABS  --  0.2  --   BASOSABS  --  0.0  --     Chemistries  Recent Labs  Lab 08/17/19 0510 08/18/19 0537 08/19/19 0412 08/20/19 0633 08/21/19 0527  NA 143 143 143 142 142  K 3.3* 3.9 4.0 4.3 4.0  CL 101 100 103 102 100  CO2 28 32 29 27 30   GLUCOSE 165* 121* 157* 93 112*  BUN 19 20 25* 32* 30*   CREATININE 1.80* 2.21* 2.64* 2.73* 2.27*  CALCIUM 9.2 8.6* 8.2* 8.1* 7.9*  MG 1.7  --   --  1.9  --   AST 20  --   --   --   --   ALT 12  --   --   --   --   ALKPHOS 88  --   --   --   --   BILITOT 1.7*  --   --   --   --    ------------------------------------------------------------------------------------------------------------------ No results for input(s): CHOL, HDL, LDLCALC, TRIG, CHOLHDL, LDLDIRECT in the last 72 hours.  Lab Results  Component Value Date   HGBA1C 8.2 (H) 05/23/2019   ------------------------------------------------------------------------------------------------------------------ No results for input(s): TSH, T4TOTAL, T3FREE, THYROIDAB in the last 72 hours.  Invalid input(s): FREET3 ------------------------------------------------------------------------------------------------------------------ No results for input(s): VITAMINB12, FOLATE, FERRITIN, TIBC, IRON, RETICCTPCT in the last 72 hours.  Coagulation profile No results for input(s): INR, PROTIME in the last 168 hours.  No results for input(s): DDIMER in the last 72 hours.  Cardiac Enzymes No results for input(s): CKMB, TROPONINI, MYOGLOBIN in the last 168 hours.  Invalid input(s): CK ------------------------------------------------------------------------------------------------------------------    Component Value Date/Time   BNP 1,427.6 (H) 08/17/2019 0457   BNP 43.7 11/07/2011 1436    Inpatient Medications  Scheduled Meds: . allopurinol  100 mg Oral Daily  . [START ON 08/22/2019] aspirin  81 mg Oral Pre-Cath  . aspirin EC  81 mg Oral Daily  . busPIRone  15 mg Oral BID  . Chlorhexidine Gluconate Cloth  6 each Topical Daily  . enoxaparin (LOVENOX) injection  30 mg Subcutaneous Q24H  . FLUoxetine  20 mg Oral QHS  . gabapentin  400 mg Oral TID  . insulin aspart  0-9 Units Subcutaneous TID WC  . insulin glargine  60 Units Subcutaneous Daily  . mouth rinse  15 mL Mouth Rinse BID  .  nicotine  21 mg Transdermal Daily  . pantoprazole  40 mg Oral Daily  . potassium chloride SA  20 mEq Oral Daily  . rosuvastatin  20 mg Oral Daily  . topiramate  25 mg Oral BID   Continuous Infusions: . sodium chloride    . [START ON 08/22/2019] sodium chloride    . cefTRIAXone (ROCEPHIN)  IV Stopped (08/21/19 0956)  . furosemide (LASIX) infusion 8 mg/hr (08/21/19 1200)  . milrinone 0.25 mcg/kg/min (08/21/19 1200)   PRN Meds:.sodium chloride, acetaminophen **OR** acetaminophen, albuterol, hydrALAZINE, nitroGLYCERIN, ondansetron **OR** ondansetron (ZOFRAN) IV, sodium chloride flush  Micro Results Recent Results (from the past 240 hour(s))  SARS Coronavirus 2 [facility] order, Performed in [facility] hospital lab) Nasopharyngeal Nasopharyngeal Swab     Status: None   Collection Time: 08/17/19 12:38 AM   Specimen: Nasopharyngeal Swab  Result Value Ref Range Status   SARS Coronavirus 2 NEGATIVE NEGATIVE Final    Comment: (NOTE) If result is NEGATIVE SARS-CoV-2 target nucleic acids are NOT DETECTED. The SARS-CoV-2 RNA  is generally detectable in upper and lower  respiratory specimens during the acute phase of infection. The lowest  concentration of SARS-CoV-2 viral copies this assay can detect is 250  copies / mL. A negative result does not preclude SARS-CoV-2 infection  and should not be used as the sole basis for treatment or other  patient management decisions.  A negative result may occur with  improper specimen collection / handling, submission of specimen other  than nasopharyngeal swab, presence of viral mutation(s) within the  areas targeted by this assay, and inadequate number of viral copies  (<250 copies / mL). A negative result must be combined with clinical  observations, patient history, and epidemiological information. If result is POSITIVE SARS-CoV-2 target nucleic acids are DETECTED. The SARS-CoV-2 RNA is generally detectable in upper and lower  respiratory specimens  dur ing the acute phase of infection.  Positive  results are indicative of active infection with SARS-CoV-2.  Clinical  correlation with patient history and other diagnostic information is  necessary to determine patient infection status.  Positive results do  not rule out bacterial infection or co-infection with other viruses. If result is PRESUMPTIVE POSTIVE SARS-CoV-2 nucleic acids MAY BE PRESENT.   A presumptive positive result was obtained on the submitted specimen  and confirmed on repeat testing.  While 2019 novel coronavirus  (SARS-CoV-2) nucleic acids may be present in the submitted sample  additional confirmatory testing may be necessary for epidemiological  and / or clinical management purposes  to differentiate between  SARS-CoV-2 and other Sarbecovirus currently known to infect humans.  If clinically indicated additional testing with an alternate test  methodology [phone]) is advised. The SARS-CoV-2 RNA is generally  detectable in upper and lower respiratory sp ecimens during the acute  phase of infection. The expected result is Negative. Fact Sheet for Patients:  StrictlyIdeas.no Fact Sheet for Healthcare Providers: BankingDealers.co.za This test is not yet approved or cleared by the [place] FDA and has been authorized for detection and/or diagnosis of SARS-CoV-2 by FDA under an Emergency Use Authorization (EUA).  This EUA will remain in effect (meaning this test can be used) for the duration of the COVID-19 declaration under Section 564(b)(1) of the Act, 21 U.S.C. section 360bbb-3(b)(1), unless the authorization is terminated or revoked sooner. Performed at [facility], [place] [address] [place], [place] [address]   Culture, Urine     Status: Abnormal   Collection Time: 08/19/19  3:51 PM   Specimen: Urine, Clean Catch  Result Value Ref Range Status   Specimen Description URINE, CLEAN CATCH  Final   Special  Requests   Final    NONE Performed at [facility], [place] [address] [place], [place] [address]    Culture MULTIPLE SPECIES PRESENT, SUGGEST RECOLLECTION (A)  Final   Report Status 08/20/2019 FINAL  Final    Radiology Reports Dg Chest 2 View  Result Date: 08/16/2019 CLINICAL DATA:  63 year old female with chest pain and shortness of breath. EXAM: CHEST - 2 VIEW COMPARISON:  Chest radiograph dated 01/05/2019 FINDINGS: Small left pleural effusion and left lung base atelectasis versus infiltrate. Overall slight interval increase in the size of the left pleural effusion and associated left lung base opacity compared to the prior radiograph. The right lung remains clear. No pneumothorax. Stable cardiac silhouette. No acute osseous pathology. Degenerative changes of the spine. IMPRESSION: Small left pleural effusion and left lung base atelectasis versus infiltrate, increased in size since the prior radiograph. Electronically Signed  By: Anner Crete M.D.   On: 08/16/2019 19:01   Dg Chest Port 1 View  Result Date: 08/20/2019 CLINICAL DATA:  Central line placement EXAM: PORTABLE CHEST 1 VIEW COMPARISON:  08/16/2019, 01/05/2019 FINDINGS: Insertion of right IJ Swan-Ganz catheter, tip projects over right lower lobe pulmonary artery. Negative for right pneumothorax. Suspected left pleural effusion and left basilar airspace disease. Possible small right effusion. Aortic atherosclerosis. Cardiomegaly. IMPRESSION: 1. Right IJ Swan-Ganz catheter tip projects over right lower lobe pulmonary artery and may be withdrawn for more optimal positioning. Negative for right pneumothorax 2. Cardiomegaly with left pleural effusion and left basilar airspace disease. Probable small right pleural effusion. Electronically Signed   By: Donavan Foil M.D.   On: 08/20/2019 15:59     Phillips Climes M.D on 08/21/2019 at 12:49 PM  Between 7am to 7pm - Pager - [phone]  After 7pm go to [email or web address] - password [facility]   [facility] -  Office  [phone]

## 2019-08-22 ENCOUNTER — Encounter (HOSPITAL_COMMUNITY)
Admission: EM | Disposition: A | Discharge status: Home Health | Payer: Self-pay | Source: Ambulatory Visit | Attending: Internal Medicine

## 2019-08-22 LAB — COOXEMETRY PANEL
Carboxyhemoglobin: 2.3 % — ABNORMAL HIGH (ref 0.5–1.5)
Methemoglobin: 1.1 % (ref 0.0–1.5)
O2 Saturation: 83.8 %
Total hemoglobin: 14.6 g/dL (ref 12.0–16.0)

## 2019-08-22 LAB — CBC
HCT: 46 % (ref 36.0–46.0)
Hemoglobin: 14.1 g/dL (ref 12.0–15.0)
MCH: 29.3 pg (ref 26.0–34.0)
MCHC: 30.7 g/dL (ref 30.0–36.0)
MCV: 95.6 fL (ref 80.0–100.0)
Platelets: 154 10*3/uL (ref 150–400)
RBC: 4.81 MIL/uL (ref 3.87–5.11)
RDW: 15.9 % — ABNORMAL HIGH (ref 11.5–15.5)
WBC: 8.7 10*3/uL (ref 4.0–10.5)
nRBC: 0 % (ref 0.0–0.2)

## 2019-08-22 LAB — BASIC METABOLIC PANEL
Anion gap: 11 (ref 5–15)
Anion gap: 12 (ref 5–15)
BUN: 31 mg/dL — ABNORMAL HIGH (ref 8–23)
BUN: 34 mg/dL — ABNORMAL HIGH (ref 8–23)
CO2: 30 mmol/L (ref 22–32)
CO2: 35 mmol/L — ABNORMAL HIGH (ref 22–32)
Calcium: 7.1 mg/dL — ABNORMAL LOW (ref 8.9–10.3)
Calcium: 7.6 mg/dL — ABNORMAL LOW (ref 8.9–10.3)
Chloride: 93 mmol/L — ABNORMAL LOW (ref 98–111)
Chloride: 97 mmol/L — ABNORMAL LOW (ref 98–111)
Creatinine, Ser: 2.57 mg/dL — ABNORMAL HIGH (ref 0.44–1.00)
Creatinine, Ser: 2.69 mg/dL — ABNORMAL HIGH (ref 0.44–1.00)
GFR calc Af Amer: 21 mL/min — ABNORMAL LOW (ref >60)
GFR calc Af Amer: 22 mL/min — ABNORMAL LOW (ref >60)
GFR calc non Af Amer: 18 mL/min — ABNORMAL LOW (ref >60)
GFR calc non Af Amer: 19 mL/min — ABNORMAL LOW (ref >60)
Glucose, Bld: 156 mg/dL — ABNORMAL HIGH (ref 70–99)
Glucose, Bld: 184 mg/dL — ABNORMAL HIGH (ref 70–99)
Potassium: 3.9 mmol/L (ref 3.5–5.1)
Potassium: 4.1 mmol/L (ref 3.5–5.1)
Sodium: 139 mmol/L (ref 135–145)
Sodium: 139 mmol/L (ref 135–145)

## 2019-08-22 LAB — GLUCOSE, CAPILLARY
Glucose-Capillary: 159 mg/dL — ABNORMAL HIGH (ref 70–99)
Glucose-Capillary: 160 mg/dL — ABNORMAL HIGH (ref 70–99)
Glucose-Capillary: 133 mg/dL — ABNORMAL HIGH (ref 70–99)
Glucose-Capillary: 189 mg/dL — ABNORMAL HIGH (ref 70–99)

## 2019-08-22 LAB — MAGNESIUM: Magnesium: 1.7 mg/dL (ref 1.7–2.4)

## 2019-08-22 LAB — MRSA PCR SCREENING: MRSA by PCR: NEGATIVE

## 2019-08-22 SURGERY — RIGHT HEART CATH
Anesthesia: LOCAL

## 2019-08-22 MED ORDER — ACETAZOLAMIDE 250 MG PO TABS
250.0000 mg | ORAL_TABLET | Freq: Two times a day (BID) | ORAL | Status: DC
  Filled 2019-08-22: qty 1

## 2019-08-22 MED ORDER — FUROSEMIDE 80 MG PO TABS: 80.0000 mg | ORAL_TABLET | Freq: Every day | ORAL | Status: DC

## 2019-08-22 MED ORDER — MAGNESIUM SULFATE 2 GM/50ML IV SOLN
2.0000 g | Freq: Once | INTRAVENOUS | Status: AC
  Administered 2019-08-22: 2 g via INTRAVENOUS
  Filled 2019-08-22: qty 50

## 2019-08-22 NOTE — Progress Notes (Signed)
[facility]    NEPHROLOGY PROGRESS NOTE  SUBJECTIVE: Now in ICU with Swan-Ganz catheter.  On milrinone, Lasix drip discontinued. Large amount of urine output noted per RN.  Denies chest pain, shortness of breath, nausea, vomiting, diarrhea or dysuria.  Does have some dyspnea on exertion which is improving.  Notes market improvement in lower extremity edema.  All other review of systems are negative.    OBJECTIVE:  Vitals:   08/22/19 1300 08/22/19 1400  BP: (!) 102/51 (!) 113/57  Pulse: 80 78  Resp: 15 12  Temp: (!) 97 F (36.1 C) (!) 97 F (36.1 C)  SpO2:      Intake/Output Summary (Last 24 hours) at 08/22/2019 1453 Last data filed at 08/22/2019 1400 Gross per 24 hour  Intake 1168.07 ml  Output 5990 ml  Net -4821.93 ml      General:  AAOx3 NAD HEENT: MMM [place] AT anicteric sclera Neck:  No JVD visible, no adenopathy CV:  Heart RRR  Lungs:  L/S CTA bilaterally Abd:  abd SNT/ND with normal BS, obese GU:  Bladder non-palpable Extremities: +1 bilateral lower extremity edema Skin:  No skin rash  MEDICATIONS:  . allopurinol  100 mg Oral Daily  . aspirin EC  81 mg Oral Daily  . busPIRone  15 mg Oral BID  . Chlorhexidine Gluconate Cloth  6 each Topical Daily  . enoxaparin (LOVENOX) injection  30 mg Subcutaneous Q24H  . FLUoxetine  20 mg Oral QHS  . gabapentin  400 mg Oral TID  . insulin aspart  0-9 Units Subcutaneous TID WC  . insulin glargine  60 Units Subcutaneous Daily  . mouth rinse  15 mL Mouth Rinse BID  . nicotine  21 mg Transdermal Daily  . pantoprazole  40 mg Oral Daily  . potassium chloride SA  20 mEq Oral Daily  . rosuvastatin  20 mg Oral Daily  . sodium chloride flush  3 mL Intravenous Q12H  . topiramate  25 mg Oral BID       LABS:   CBC Latest Ref Rng & Units 08/22/2019 08/21/2019 08/17/2019  WBC 4.0 - 10.5 K/uL 8.7 10.1 7.6  Hemoglobin 12.0 - 15.0 g/dL 14.1 15.3(H) 16.2(H)  Hematocrit 36.0 - 46.0 % 46.0 49.8(H) 52.3(H)  Platelets 150 -  400 K/uL 154 173 184    CMP Latest Ref Rng & Units 08/22/2019 08/21/2019 08/21/2019  Glucose 70 - 99 mg/dL 184(H) 156(H) 112(H)  BUN 8 - 23 mg/dL 34(H) 31(H) 30(H)  Creatinine 0.44 - 1.00 mg/dL 2.69(H) 2.57(H) 2.27(H)  Sodium 135 - 145 mmol/L 139 139 142  Potassium 3.5 - 5.1 mmol/L 4.1 3.9 4.0  Chloride 98 - 111 mmol/L 93(L) 97(L) 100  CO2 22 - 32 mmol/L 35(H) 30 30  Calcium 8.9 - 10.3 mg/dL 7.6(L) 7.1(L) 7.9(L)  Total Protein 6.5 - 8.1 g/dL - - -  Total Bilirubin 0.3 - 1.2 mg/dL - - -  Alkaline Phos 38 - 126 U/L - - -  AST 15 - 41 U/L - - -  ALT 0 - 44 U/L - - -    Lab Results  Component Value Date   CALCIUM 7.6 (L) 08/22/2019   CAION 1.18 09/30/2013   PHOS 3.8 01/27/2012       Component Value Date/Time   COLORURINE AMBER (A) 08/18/2019 2150   APPEARANCEUR CLOUDY (A) 08/18/2019 2150   LABSPEC 1.017 08/18/2019 2150   PHURINE 5.0 08/18/2019 2150   GLUCOSEU NEGATIVE 08/18/2019 2150   HGBUR MODERATE (  A) 08/18/2019 2150   BILIRUBINUR NEGATIVE 08/18/2019 2150   BILIRUBINUR 1+ 02/07/2019 1433   KETONESUR NEGATIVE 08/18/2019 2150   PROTEINUR >=300 (A) 08/18/2019 2150   UROBILINOGEN 0.2 02/07/2019 1433   UROBILINOGEN 0.2 07/07/2013 1353   NITRITE POSITIVE (A) 08/18/2019 2150   LEUKOCYTESUR LARGE (A) 08/18/2019 2150      Component Value Date/Time   PHART 7.420 07/30/2017 1338   PCO2ART 45.0 07/30/2017 1338   PO2ART 59.0 (L) 07/30/2017 1338   HCO3 33.0 (H) 07/30/2017 1339   TCO2 35 07/30/2017 1339   ACIDBASEDEF 0.8 12/20/2008 0130   O2SAT 83.8 08/22/2019 0410    No results found for: IRON, TIBC, FERRITIN, IRONPCTSAT     2D echocardiogram from 08/18/2019: Right ventricular with severely reduced systolic function.  Large pleural effusion in the left lateral region.  Severe concentric LVH.  Ejection fraction 30 to 35%.  ASSESSMENT/PLAN:    1.  Chronic kidney disease stage III.  Her baseline serum creatinine appears to run in the mid ones.  Will check urine protein to creatinine  ratio.    Prior history of around 100 mg of microalbuminuria.  Does have underlying diabetes.  2.  Acute kidney injury.    Serum creatinine is improving.  Suspect low output cardiac failure playing a role.  Agree with discontinuation of Lasix drip.  Watch serum bicarbonate level.  May need acetazolamide.  3.  Hypertensive urgency.  Blood pressures elevated on admission, but currently stable.  4.  Diabetes mellitus.  Changes per primary team.  5.  Acute on chronic systolic and diastolic congestive heart failure.    Markedly elevated wedge pressures which are improving.  Continue milrinone.    [place], DO, [place]

## 2019-08-22 NOTE — Progress Notes (Addendum)
Advanced Heart Failure Rounding Note   Subjective:    Remains on milrinone 0.25. CO-OX 83% . Yesterday lasix drip was cut back to 8 mg per hour. Brisk output noted.   Feeling better. Denies SOB.   CVP 8 PA 49/16 (26)  PCWP 13 Thermo 7.8/3.6  SVR 809 PVR 1.6    Objective:   Weight Range:  Vital Signs:   Temp:  [97.2 F (36.2 C)-99.1 F (37.3 C)] 97.6 F (36.4 C) (09/14 0745) Pulse Rate:  [43-96] 87 (09/14 0700) Resp:  [8-29] 15 (09/14 0700) BP: (70-173)/(39-106) 129/52 (09/14 0700) SpO2:  [85 %-100 %] 96 % (09/14 0700) Weight:  [110.7 kg] 110.7 kg (09/14 0400) Last BM Date: 08/19/19  Weight change: Filed Weights   08/20/19 0446 08/21/19 0500 08/22/19 0400  Weight: 116.3 kg 116.2 kg 110.7 kg    Intake/Output:   Intake/Output Summary (Last 24 hours) at 08/22/2019 0750 Last data filed at 08/22/2019 0700 Gross per 24 hour  Intake 1315.08 ml  Output 6795 ml  Net -5479.92 ml     Physical Exam: CVP 8  General:  No resp difficulty HEENT: normal Neck: supple. JVP 7-8 . Carotids 2+ bilat; no bruits. No lymphadenopathy or thryomegaly appreciated. RIJ Swan  Cor: PMI nondisplaced. Regular rate & rhythm. No rubs, gallops or murmurs. Lungs: clear on 4 liters Abdomen: obese soft, nontender, nondistended. No hepatosplenomegaly. No bruits or masses. Good bowel sounds. Extremities: no cyanosis, clubbing, rash, edema Neuro: alert & orientedx3, cranial nerves grossly intact. moves all 4 extremities w/o difficulty. Affect pleasant  Telemetry: NSR 90s personally reviewed.   Labs: Basic Metabolic Panel: Recent Labs  Lab 08/17/19 0510  08/19/19 0412 08/20/19 4967 08/21/19 0527 08/21/19 2333 08/22/19 0415  NA 143   < > 143 142 142 139 139  K 3.3*   < > 4.0 4.3 4.0 3.9 4.1  CL 101   < > 103 102 100 97* 93*  CO2 28   < > 29 27 30 30  35*  GLUCOSE 165*   < > 157* 93 112* 156* 184*  BUN 19   < > 25* 32* 30* 31* 34*  CREATININE 1.80*   < > 2.64* 2.73* 2.27* 2.57*  2.69*  CALCIUM 9.2   < > 8.2* 8.1* 7.9* 7.1* 7.6*  MG 1.7  --   --  1.9  --   --  1.7   < > = values in this interval not displayed.    Liver Function Tests: Recent Labs  Lab 08/17/19 0510  AST 20  ALT 12  ALKPHOS 88  BILITOT 1.7*  PROT 6.4*  ALBUMIN 3.6   No results for input(s): LIPASE, AMYLASE in the last 168 hours. No results for input(s): AMMONIA in the last 168 hours.  CBC: Recent Labs  Lab 08/16/19 1745 08/17/19 0510 08/21/19 0527 08/22/19 0415  WBC 7.4 7.6 10.1 8.7  NEUTROABS  --  5.1  --   --   HGB 16.4* 16.2* 15.3* 14.1  HCT 53.2* 52.3* 49.8* 46.0  MCV 94.2 92.7 95.4 95.6  PLT 184 184 173 154    Cardiac Enzymes: No results for input(s): CKTOTAL, CKMB, CKMBINDEX, TROPONINI in the last 168 hours.  BNP: BNP (last 3 results) Recent Labs    08/17/19 0457  BNP 1,427.6*    ProBNP (last 3 results) Recent Labs    05/23/19 1201  PROBNP 1,442.0*      Other results:  Imaging: Dg Chest Port 1 View  Result Date: 08/20/2019  CLINICAL DATA:  Central line placement EXAM: PORTABLE CHEST 1 VIEW COMPARISON:  08/16/2019, 01/05/2019 FINDINGS: Insertion of right IJ Swan-Ganz catheter, tip projects over right lower lobe pulmonary artery. Negative for right pneumothorax. Suspected left pleural effusion and left basilar airspace disease. Possible small right effusion. Aortic atherosclerosis. Cardiomegaly. IMPRESSION: 1. Right IJ Swan-Ganz catheter tip projects over right lower lobe pulmonary artery and may be withdrawn for more optimal positioning. Negative for right pneumothorax 2. Cardiomegaly with left pleural effusion and left basilar airspace disease. Probable small right pleural effusion. Electronically Signed   By: Donavan Foil M.D.   On: 08/20/2019 15:59     Medications:     Scheduled Medications: . allopurinol  100 mg Oral Daily  . aspirin EC  81 mg Oral Daily  . busPIRone  15 mg Oral BID  . Chlorhexidine Gluconate Cloth  6 each Topical Daily  .  enoxaparin (LOVENOX) injection  30 mg Subcutaneous Q24H  . FLUoxetine  20 mg Oral QHS  . gabapentin  400 mg Oral TID  . insulin aspart  0-9 Units Subcutaneous TID WC  . insulin glargine  60 Units Subcutaneous Daily  . mouth rinse  15 mL Mouth Rinse BID  . nicotine  21 mg Transdermal Daily  . pantoprazole  40 mg Oral Daily  . potassium chloride SA  20 mEq Oral Daily  . rosuvastatin  20 mg Oral Daily  . sodium chloride flush  3 mL Intravenous Q12H  . topiramate  25 mg Oral BID    Infusions: . sodium chloride    . sodium chloride 10 mL/hr at 08/22/19 0700  . furosemide (LASIX) infusion 8 mg/hr (08/22/19 0700)  . milrinone 0.25 mcg/kg/min (08/22/19 0700)    PRN Medications: sodium chloride, acetaminophen **OR** acetaminophen, albuterol, hydrALAZINE, nitroGLYCERIN, ondansetron **OR** ondansetron (ZOFRAN) IV, sodium chloride flush   Assessment/Plan:   1. Cardiogenic shock  - initial swan numbers confirmed biventricular failure with low CI @ 1.6 - Improved with milrinone. Co-ox 84%  2. Acute biventricular HF  - echo EF 25-30%. RV severely HK  - EF newly down. Troponin normal. Clearly not ACS.  - Cath 2018 with minimal CAD - Will not repeat coronary angio currently with AKI - initial swan numbers confirmed biventricular failure with low CI @ 1.6. much improved with milrinone and diuresis.  - Cut milrinone to 0.125 mcg. CO-OX 83% - CVP 8. Stop lasix drip. Start lasix 80 mg daily.  - Add diamox 250 bid today  - No spiro/dig/Arb  with elevated creatinine.   - Need to consider amyloid. Check myeloma panel.   Possible PYP soon   3. Acute on chronic hypoxic respiratory failure - multifactorial  - continue O2. Wean oxygen as needed.   4. PAH with /cor pulmonale - likely who GROUP II & III - Has sleep apnea but has not uses CPAP. Will need repeat sleep study.   5. AKI - baseline creatinine 1.4-1.7 - Creatinine peaked at 2.73.  - Creatinine trending back up 2.7. Stop lasix  drip.  - DM and HTN also likely contributing - avoid nephrotoxic meds - D/w Renal at bedside  6. COPD with ongoing tobacco use - discussed need to quit  7. Morbid obesity - need weight loss  8. OSA - needs CPAP.   9. DM2 - continue SSI  Hopefully can get swan out tomorrow.    Length of Stay: [place] NP-C  08/22/2019, 7:50 AM  Advanced Heart Failure Team Pager [phone] (M-F;  7a - 4p)  Please contact [facility] for night-coverage after hours (4p -7a ) and weekends on [email or web address]   Agree with above.  Swan in place. Remains in ICU. On milrinone 0.25 and lasix gtt. Brisk diuresis again last night. Hemodynamics improved (swan numbers reviewed personally)  But creatinine back up. Denies SOB, orthopnea or PND. BP stable  General:  Obese woman Lying in bed. No resp difficulty HEENT: normal Neck: supple. RIJ swan Carotids 2+ bilat; no bruits. No lymphadenopathy or thryomegaly appreciated. Cor: PMI nondisplaced. Regular rate & rhythm. No rubs, gallops or murmurs. Lungs: clear Abdomen: obese soft, nontender, nondistended. No hepatosplenomegaly. No bruits or masses. Good bowel sounds. Extremities: no cyanosis, clubbing, rash, edema Neuro: alert & orientedx3, cranial nerves grossly intact. moves all 4 extremities w/o difficulty. Affect pleasant  She remains on inotrope support. Volume status much improved but renal function worse. Will stop all diuretics. Turn milrinone down to 0.125. Would not use diamox at this time. Will leave swan in one more day. Will need to see where renal function settles out. I was hoping it would be better than this.   Glori Bickers, MD  8:53 AM

## 2019-08-22 NOTE — Progress Notes (Signed)
PROGRESS NOTE                                                                                                                                                                                                             Patient Demographics:    [name], is a 63 y.o. female, DOB - 12/31/1955, TGY:[id number]  Admit date - 08/16/2019   Admitting Physician Rise Patience, MD  Outpatient Primary MD for the patient is Luetta Nutting, DO  LOS - 6   Chief Complaint  Patient presents with  . Shortness of Breath  . Chest Pain       Brief Narrative    63 y.o. female with history of chronic diastolic CHF, hypertension, chronic kidney disease, pulmonary hypertension diabetes mellitus, CVA, CSF rhinorrhea status post reconstruction surgery sleep apnea and ongoing tobacco abuse presents to the ER because of increasing shortness of breath over the last 3 weeks, work-up significant for volume overload, admitted for IV diuresis.  Patient was on IV Lasix initially, but this has been held as her creatinine kept trending up despite holding her IV diuresis, cardiology were consulted to assist with diuresis, and renal consulted to assist with worsening renal function.  He was transferred to CCU 9/12, for initiation of milrinone drip, and close monitoring with Swan-Ganz.   Subjective:    [name] today denies any fever, chills, chest pain or dyspnea, reports he had a good night sleep .   Assessment  & Plan :    Principal Problem:   Acute diastolic CHF (congestive heart failure) (HCC) Active Problems:   Essential hypertension   Morbid obesity due to excess calories (HCC)   HLD (hyperlipidemia)   OSA (obstructive sleep apnea)   COPD  GOLD 0 still smoking   Pulmonary hypertension, unspecified (HCC)   Type 2 diabetes mellitus with stage 3 chronic kidney disease, with long-term current use of insulin (HCC)  Acute on chronic combined  systolic/diastolic CHF, and RV failure/cardiogenic shock. -Most recent echo and 2018 showing EF was 60 to 65% with grade 2 diastolic dysfunction, repeat echo this admission showing EF 30-35% w/ diffuse hypokinesis, severe concentric LVH and severely educed RV systolic function.  -Significantly worsening renal function, despite minimal diuresis/holding diuresis. -CHF team input greatly appreciated, patient currently in CCU, secondary to cardiogenic shock, on Lasix drip initially, but  it has been stopped today given jump in creatinine to 2.7, but she had good response to diuresis yesterday, -5.4 L over last 24 hours , he remains on milrinone drip . - No spiro/dig/Arb  with elevated creatinine.   - Amyloidosis ? follow-up multiple myeloma sent by CHF team,  AKI on Chronic kidney disease stage III  -Baseline creatinine around 1.5, continue to trend up despite holding diuresis, continued peaked at 2.7, it did improve initially with milrinone drip, but has worsened to 2.7 overnight, it is currently on hold . -Avoid nephrotoxic medication  -Renal input greatly appreciated -Possibly related to cardiorenal syndrome  Hypertensive urgency -patient blood pressure  elevated in ED. -Currently blood pressure on the lower side.  Diabetes mellitus type 2 -Patient on significant Lantus dose at home 120 units subcu daily, she has been hypoglycemic, her CBG currently controlled on 60 units twice daily and sliding scale .  Sleep apnea on CPAP.  UTI -Received Total of 3 days of Rocephin  COPD  - not actively wheezing continue inhalers.   Tobacco abuse  - advised about quitting.  Hyperlipidemia on statins.  History of CVA  - on statins and antiplatelet agents.  History of gout  - on allopurinol.  History of depression and anxiety  - on Prozac and BuSpar.   COVID-19 Labs  No results for input(s): DDIMER, FERRITIN, LDH, CRP in the last 72 hours.  Lab Results  Component Value Date    [place] NEGATIVE 08/17/2019     Code Status : Full code  Family Communication  : Discussed with her niece via phone  Disposition Plan  : Home once stable  Consults  : cardiology, Nephrology.  Procedures  : None  DVT Prophylaxis  : Subcu Lovenox  Lab Results  Component Value Date   PLT 154 08/22/2019    Antibiotics  :    Anti-infectives (From admission, onward)   Start     Dose/Rate Route Frequency Ordered Stop   08/19/19 0815  cefTRIAXone (ROCEPHIN) 1 g in sodium chloride 0.9 % 100 mL IVPB  Status:  Discontinued     1 g 200 mL/hr over 30 Minutes Intravenous Every 24 hours 08/19/19 0814 08/21/19 1256        Objective:   Vitals:   08/22/19 0630 08/22/19 0700 08/22/19 0745 08/22/19 0800  BP: (!) 134/56 (!) 129/52  127/62  Pulse: 88 87  92  Resp: 17 15  (!) 24  Temp: 98.1 F (36.7 C) 97.9 F (36.6 C) 97.6 F (36.4 C)   TempSrc:   Oral   SpO2: 97% 96%  97%  Weight:      Height:        Wt Readings from Last 3 Encounters:  08/22/19 110.7 kg  08/16/19 116.6 kg  07/04/19 113.3 kg     Intake/Output Summary (Last 24 hours) at 08/22/2019 1113 Last data filed at 08/22/2019 1100 Gross per 24 hour  Intake 1057.41 ml  Output 6620 ml  Net -5562.59 ml     Physical Exam  Awake Alert, Oriented X 3, No new F.N deficits, Normal affect Neck supple, JVP 7 cm Symmetrical Chest wall movement, Good air movement bilaterally, CTAB RRR,No Gallops,Rubs or new Murmurs, No Parasternal Heave +ve B.Sounds, Abd Soft, No tenderness, No rebound - guarding or rigidity. No Cyanosis, Clubbing, edema resolved, No new Rash or bruise       Data Review:    CBC Recent Labs  Lab 08/16/19 1745 08/17/19 0510 08/21/19 0527 08/22/19 5681  WBC 7.4 7.6 10.1 8.7  HGB 16.4* 16.2* 15.3* 14.1  HCT 53.2* 52.3* 49.8* 46.0  PLT 184 184 173 154  MCV 94.2 92.7 95.4 95.6  MCH 29.0 28.7 29.3 29.3  MCHC 30.8 31.0 30.7 30.7  RDW 16.7* 16.5* 16.5* 15.9*  LYMPHSABS  --  1.8  --   --    MONOABS  --  0.4  --   --   EOSABS  --  0.2  --   --   BASOSABS  --  0.0  --   --     Chemistries  Recent Labs  Lab 08/17/19 0510  08/19/19 0412 08/20/19 0633 08/21/19 0527 08/21/19 2333 08/22/19 0415  NA 143   < > 143 142 142 139 139  K 3.3*   < > 4.0 4.3 4.0 3.9 4.1  CL 101   < > 103 102 100 97* 93*  CO2 28   < > 29 27 30 30  35*  GLUCOSE 165*   < > 157* 93 112* 156* 184*  BUN 19   < > 25* 32* 30* 31* 34*  CREATININE 1.80*   < > 2.64* 2.73* 2.27* 2.57* 2.69*  CALCIUM 9.2   < > 8.2* 8.1* 7.9* 7.1* 7.6*  MG 1.7  --   --  1.9  --   --  1.7  AST 20  --   --   --   --   --   --   ALT 12  --   --   --   --   --   --   ALKPHOS 88  --   --   --   --   --   --   BILITOT 1.7*  --   --   --   --   --   --    < > = values in this interval not displayed.   ------------------------------------------------------------------------------------------------------------------ No results for input(s): CHOL, HDL, LDLCALC, TRIG, CHOLHDL, LDLDIRECT in the last 72 hours.  Lab Results  Component Value Date   HGBA1C 8.2 (H) 05/23/2019   ------------------------------------------------------------------------------------------------------------------ No results for input(s): TSH, T4TOTAL, T3FREE, THYROIDAB in the last 72 hours.  Invalid input(s): FREET3 ------------------------------------------------------------------------------------------------------------------ No results for input(s): VITAMINB12, FOLATE, FERRITIN, TIBC, IRON, RETICCTPCT in the last 72 hours.  Coagulation profile No results for input(s): INR, PROTIME in the last 168 hours.  No results for input(s): DDIMER in the last 72 hours.  Cardiac Enzymes No results for input(s): CKMB, TROPONINI, MYOGLOBIN in the last 168 hours.  Invalid input(s): CK ------------------------------------------------------------------------------------------------------------------    Component Value Date/Time   BNP 1,427.6 (H) 08/17/2019 0457    BNP 43.7 11/07/2011 1436    Inpatient Medications  Scheduled Meds: . allopurinol  100 mg Oral Daily  . aspirin EC  81 mg Oral Daily  . busPIRone  15 mg Oral BID  . Chlorhexidine Gluconate Cloth  6 each Topical Daily  . enoxaparin (LOVENOX) injection  30 mg Subcutaneous Q24H  . FLUoxetine  20 mg Oral QHS  . gabapentin  400 mg Oral TID  . insulin aspart  0-9 Units Subcutaneous TID WC  . insulin glargine  60 Units Subcutaneous Daily  . mouth rinse  15 mL Mouth Rinse BID  . nicotine  21 mg Transdermal Daily  . pantoprazole  40 mg Oral Daily  . potassium chloride SA  20 mEq Oral Daily  . rosuvastatin  20 mg Oral Daily  . sodium chloride flush  3 mL Intravenous Q12H  .  topiramate  25 mg Oral BID   Continuous Infusions: . sodium chloride    . sodium chloride Stopped (08/22/19 0850)  . milrinone 0.125 mcg/kg/min (08/22/19 0900)   PRN Meds:.sodium chloride, acetaminophen **OR** acetaminophen, albuterol, hydrALAZINE, nitroGLYCERIN, ondansetron **OR** ondansetron (ZOFRAN) IV, sodium chloride flush  Micro Results Recent Results (from the past 240 hour(s))  SARS Coronavirus 2 [facility] order, Performed in [facility] hospital lab) Nasopharyngeal Nasopharyngeal Swab     Status: None   Collection Time: 08/17/19 12:38 AM   Specimen: Nasopharyngeal Swab  Result Value Ref Range Status   SARS Coronavirus 2 NEGATIVE NEGATIVE Final    Comment: (NOTE) If result is NEGATIVE SARS-CoV-2 target nucleic acids are NOT DETECTED. The SARS-CoV-2 RNA is generally detectable in upper and lower  respiratory specimens during the acute phase of infection. The lowest  concentration of SARS-CoV-2 viral copies this assay can detect is 250  copies / mL. A negative result does not preclude SARS-CoV-2 infection  and should not be used as the sole basis for treatment or other  patient management decisions.  A negative result may occur with  improper specimen collection / handling, submission of specimen other   than nasopharyngeal swab, presence of viral mutation(s) within the  areas targeted by this assay, and inadequate number of viral copies  (<250 copies / mL). A negative result must be combined with clinical  observations, patient history, and epidemiological information. If result is POSITIVE SARS-CoV-2 target nucleic acids are DETECTED. The SARS-CoV-2 RNA is generally detectable in upper and lower  respiratory specimens dur ing the acute phase of infection.  Positive  results are indicative of active infection with SARS-CoV-2.  Clinical  correlation with patient history and other diagnostic information is  necessary to determine patient infection status.  Positive results do  not rule out bacterial infection or co-infection with other viruses. If result is PRESUMPTIVE POSTIVE SARS-CoV-2 nucleic acids MAY BE PRESENT.   A presumptive positive result was obtained on the submitted specimen  and confirmed on repeat testing.  While 2019 novel coronavirus  (SARS-CoV-2) nucleic acids may be present in the submitted sample  additional confirmatory testing may be necessary for epidemiological  and / or clinical management purposes  to differentiate between  SARS-CoV-2 and other Sarbecovirus currently known to infect humans.  If clinically indicated additional testing with an alternate test  methodology [phone]) is advised. The SARS-CoV-2 RNA is generally  detectable in upper and lower respiratory sp ecimens during the acute  phase of infection. The expected result is Negative. Fact Sheet for Patients:  StrictlyIdeas.no Fact Sheet for Healthcare Providers: BankingDealers.co.za This test is not yet approved or cleared by the [place] FDA and has been authorized for detection and/or diagnosis of SARS-CoV-2 by FDA under an Emergency Use Authorization (EUA).  This EUA will remain in effect (meaning this test can be used) for the duration of the  COVID-19 declaration under Section 564(b)(1) of the Act, 21 U.S.C. section 360bbb-3(b)(1), unless the authorization is terminated or revoked sooner. Performed at [facility], [place] [address], [place], [place] [address]   Culture, Urine     Status: Abnormal   Collection Time: 08/19/19  3:51 PM   Specimen: Urine, Clean Catch  Result Value Ref Range Status   Specimen Description URINE, CLEAN CATCH  Final   Special Requests   Final    NONE Performed at [facility], [place] [address] [place], [place] [address]    Culture MULTIPLE SPECIES PRESENT,  SUGGEST RECOLLECTION (A)  Final   Report Status 08/20/2019 FINAL  Final    Radiology Reports Dg Chest 2 View  Result Date: 08/16/2019 CLINICAL DATA:  63 year old female with chest pain and shortness of breath. EXAM: CHEST - 2 VIEW COMPARISON:  Chest radiograph dated 01/05/2019 FINDINGS: Small left pleural effusion and left lung base atelectasis versus infiltrate. Overall slight interval increase in the size of the left pleural effusion and associated left lung base opacity compared to the prior radiograph. The right lung remains clear. No pneumothorax. Stable cardiac silhouette. No acute osseous pathology. Degenerative changes of the spine. IMPRESSION: Small left pleural effusion and left lung base atelectasis versus infiltrate, increased in size since the prior radiograph. Electronically Signed   By: Anner Crete M.D.   On: 08/16/2019 19:01   Dg Chest Port 1 View  Result Date: 08/20/2019 CLINICAL DATA:  Central line placement EXAM: PORTABLE CHEST 1 VIEW COMPARISON:  08/16/2019, 01/05/2019 FINDINGS: Insertion of right IJ Swan-Ganz catheter, tip projects over right lower lobe pulmonary artery. Negative for right pneumothorax. Suspected left pleural effusion and left basilar airspace disease. Possible small right effusion. Aortic atherosclerosis. Cardiomegaly. IMPRESSION: 1. Right IJ Swan-Ganz catheter tip projects over right lower lobe  pulmonary artery and may be withdrawn for more optimal positioning. Negative for right pneumothorax 2. Cardiomegaly with left pleural effusion and left basilar airspace disease. Probable small right pleural effusion. Electronically Signed   By: Donavan Foil M.D.   On: 08/20/2019 15:59     Phillips Climes M.D on 08/22/2019 at 11:13 AM  Between 7am to 7pm - Pager - [phone]  After 7pm go to [email or web address] - password [facility]  [facility] -  Office  [phone]

## 2019-08-23 LAB — MULTIPLE MYELOMA PANEL, SERUM
Albumin SerPl Elph-Mcnc: 3.2 g/dL (ref 2.9–4.4)
Albumin/Glob SerPl: 1.3 (ref 0.7–1.7)
Alpha 1: 0.3 g/dL (ref 0.0–0.4)
Alpha2 Glob SerPl Elph-Mcnc: 0.8 g/dL (ref 0.4–1.0)
B-Globulin SerPl Elph-Mcnc: 0.8 g/dL (ref 0.7–1.3)
Gamma Glob SerPl Elph-Mcnc: 0.6 g/dL (ref 0.4–1.8)
Globulin, Total: 2.5 g/dL (ref 2.2–3.9)
IgA: 200 mg/dL (ref 87–352)
IgG (Immunoglobin G), Serum: 686 mg/dL (ref 586–1602)
IgM (Immunoglobulin M), Srm: 82 mg/dL (ref 26–217)
Total Protein ELP: 5.7 g/dL — ABNORMAL LOW (ref 6.0–8.5)

## 2019-08-23 LAB — BASIC METABOLIC PANEL
Anion gap: 11 (ref 5–15)
BUN: 32 mg/dL — ABNORMAL HIGH (ref 8–23)
CO2: 36 mmol/L — ABNORMAL HIGH (ref 22–32)
Calcium: 7.7 mg/dL — ABNORMAL LOW (ref 8.9–10.3)
Chloride: 93 mmol/L — ABNORMAL LOW (ref 98–111)
Creatinine, Ser: 2.31 mg/dL — ABNORMAL HIGH (ref 0.44–1.00)
GFR calc Af Amer: 25 mL/min — ABNORMAL LOW (ref >60)
GFR calc non Af Amer: 22 mL/min — ABNORMAL LOW (ref >60)
Glucose, Bld: 132 mg/dL — ABNORMAL HIGH (ref 70–99)
Potassium: 4.2 mmol/L (ref 3.5–5.1)
Sodium: 140 mmol/L (ref 135–145)

## 2019-08-23 LAB — CBC
HCT: 46.7 % — ABNORMAL HIGH (ref 36.0–46.0)
Hemoglobin: 14.4 g/dL (ref 12.0–15.0)
MCH: 29.2 pg (ref 26.0–34.0)
MCHC: 30.8 g/dL (ref 30.0–36.0)
MCV: 94.7 fL (ref 80.0–100.0)
Platelets: 157 10*3/uL (ref 150–400)
RBC: 4.93 MIL/uL (ref 3.87–5.11)
RDW: 15.8 % — ABNORMAL HIGH (ref 11.5–15.5)
WBC: 8.2 10*3/uL (ref 4.0–10.5)
nRBC: 0 % (ref 0.0–0.2)

## 2019-08-23 LAB — COOXEMETRY PANEL
Carboxyhemoglobin: 2.3 % — ABNORMAL HIGH (ref 0.5–1.5)
Methemoglobin: 1 % (ref 0.0–1.5)
O2 Saturation: 81.9 %
Total hemoglobin: 15 g/dL (ref 12.0–16.0)

## 2019-08-23 LAB — GLUCOSE, CAPILLARY
Glucose-Capillary: 93 mg/dL (ref 70–99)
Glucose-Capillary: 152 mg/dL — ABNORMAL HIGH (ref 70–99)
Glucose-Capillary: 133 mg/dL — ABNORMAL HIGH (ref 70–99)
Glucose-Capillary: 154 mg/dL — ABNORMAL HIGH (ref 70–99)
Glucose-Capillary: 171 mg/dL — ABNORMAL HIGH (ref 70–99)

## 2019-08-23 MED ORDER — ENOXAPARIN SODIUM 30 MG/0.3ML ~~LOC~~ SOLN: 30.0000 mg | Freq: Every day | SUBCUTANEOUS | Status: DC

## 2019-08-23 MED ORDER — SODIUM CHLORIDE 0.9 % IV BOLUS
250.0000 mL | Freq: Once | INTRAVENOUS | Status: AC
  Administered 2019-08-23: 10:00:00 250 mL via INTRAVENOUS

## 2019-08-23 MED ORDER — POLYETHYLENE GLYCOL 3350 17 G PO PACK
17.0000 g | PACK | Freq: Two times a day (BID) | ORAL | Status: DC
  Administered 2019-08-23 – 2019-08-25 (×3): 17 g via ORAL
  Filled 2019-08-23 (×6): qty 1

## 2019-08-23 MED ORDER — CLONAZEPAM 0.25 MG PO TBDP
0.2500 mg | ORAL_TABLET | Freq: Once | ORAL | Status: AC
  Administered 2019-08-23: 0.25 mg via ORAL
  Filled 2019-08-23: qty 1

## 2019-08-23 MED ORDER — ENOXAPARIN SODIUM 30 MG/0.3ML ~~LOC~~ SOLN
30.0000 mg | Freq: Every day | SUBCUTANEOUS | Status: DC
  Administered 2019-08-23: 30 mg via SUBCUTANEOUS
  Filled 2019-08-23: qty 0.3

## 2019-08-23 NOTE — Progress Notes (Signed)
PROGRESS NOTE                                                                                                                                                                                                             Patient Demographics:    [name], is a 63 y.o. female, DOB - 1956/06/02, ERD:[id number]  Admit date - 08/16/2019   Admitting Physician Rise Patience, MD  Outpatient Primary MD for the patient is Luetta Nutting, DO  LOS - 7   Chief Complaint  Patient presents with  . Shortness of Breath  . Chest Pain       Brief Narrative    63 y.o. female with history of chronic diastolic CHF, hypertension, chronic kidney disease, pulmonary hypertension diabetes mellitus, CVA, CSF rhinorrhea status post reconstruction surgery sleep apnea and ongoing tobacco abuse presents to the ER because of increasing shortness of breath over the last 3 weeks, work-up significant for volume overload, admitted for IV diuresis.  Patient was on IV Lasix initially, but this has been held as her creatinine kept trending up despite holding her IV diuresis, cardiology were consulted to assist with diuresis, and renal consulted to assist with worsening renal function.  He was transferred to CCU 9/12, for initiation of milrinone drip, and close monitoring with Swan-Ganz.  She is with improvement of volume status, renal function.   Subjective:    [name] today denies any fever, chills, chest pain or dyspnea, reports he had a good night sleep .   Assessment  & Plan :    Principal Problem:   Acute diastolic CHF (congestive heart failure) (HCC) Active Problems:   Essential hypertension   Morbid obesity due to excess calories (HCC)   HLD (hyperlipidemia)   OSA (obstructive sleep apnea)   COPD  GOLD 0 still smoking   Pulmonary hypertension, unspecified (HCC)   Type 2 diabetes mellitus with stage 3 chronic kidney disease, with long-term current use of  insulin (HCC)  Acute on chronic combined systolic/diastolic CHF, and RV failure/cardiogenic shock. -Most recent echo and 2018 showing EF was 60 to 65% with grade 2 diastolic dysfunction, repeat echo this admission showing EF 30-35% w/ diffuse hypokinesis, severe concentric LVH and severely educed RV systolic function.  -Significantly worsening renal function, despite minimal diuresis/holding diuresis on admission. -CHF team input greatly appreciated, patient  currently in CCU, secondary to cardiogenic shock, management per CHF team, volume status significantly improved, -9.7 L since admission, as well renal function improved, it is at 2.2 today, Swan catheter will be discontinued today, diuresis on hold, actually given she is lightheaded she will receive fluid bolus today . - Amyloidosis ? follow-up multiple myeloma sent by CHF team,  AKI on Chronic kidney disease stage III  -Baseline creatinine around 1.5, continue to trend up despite holding diuresis,  peaked at 2.7, improving with melatonin, it is 2.3 today. -Avoid nephrotoxic medication  -Renal input greatly appreciated -Possibly related to cardiorenal syndrome  Hypertensive urgency -patient blood pressure  elevated in ED.   Diabetes mellitus type 2 -Patient on significant Lantus dose at home 120 units subcu daily, she has been hypoglycemic, her CBG currently controlled on 60 units twice daily and sliding scale .  Sleep apnea on CPAP.  UTI -Received Total of 3 days of Rocephin  COPD  - not actively wheezing continue inhalers.   Tobacco abuse  - advised about quitting.  Hyperlipidemia on statins.  History of CVA  - on statins and antiplatelet agents.  History of gout  - on allopurinol.  History of depression and anxiety  - on Prozac and BuSpar.   COVID-19 Labs  No results for input(s): DDIMER, FERRITIN, LDH, CRP in the last 72 hours.  Lab Results  Component Value Date   [place] NEGATIVE 08/17/2019     Code  Status : Full code  Family Communication  : Discussed with her niece via phone 9/14  Disposition Plan  : Home once stable  Consults  : cardiology, Nephrology.  Advanced CHF team  Procedures  : Swan catheter 9/12>9/15  DVT Prophylaxis  : Subcu Lovenox  Lab Results  Component Value Date   PLT 157 08/23/2019    Antibiotics  :    Anti-infectives (From admission, onward)   Start     Dose/Rate Route Frequency Ordered Stop   08/19/19 0815  cefTRIAXone (ROCEPHIN) 1 g in sodium chloride 0.9 % 100 mL IVPB  Status:  Discontinued     1 g 200 mL/hr over 30 Minutes Intravenous Every 24 hours 08/19/19 0814 08/21/19 1256        Objective:   Vitals:   08/23/19 0900 08/23/19 1000 08/23/19 1100 08/23/19 1200  BP: (!) 166/88   (!) 167/74  Pulse: 86 83 81 78  Resp: _0 (!) 21  Temp: (!) 97.3 F (36.3 C)     TempSrc:      SpO2: 96% 95% 95% 96%  Weight:      Height:        Wt Readings from Last 3 Encounters:  08/23/19 109 kg  08/16/19 116.6 kg  07/04/19 113.3 kg     Intake/Output Summary (Last 24 hours) at 08/23/2019 1334 Last data filed at 08/23/2019 1200 Gross per 24 hour  Intake 983.53 ml  Output 2285 ml  Net -1301.47 ml     Physical Exam  Awake Alert, Oriented X 3, No new F.N deficits, Normal affect Symmetrical Chest wall movement, Good air movement bilaterally, CTAB RRR,No Gallops,Rubs or new Murmurs, No Parasternal Heave +ve B.Sounds, Abd Soft, No tenderness, No rebound - guarding or rigidity. No Cyanosis, Clubbing , trace edema, No new Rash or bruise        Data Review:    CBC Recent Labs  Lab 08/16/19 1745 08/17/19 0510 08/21/19 0527 08/22/19 0415 08/23/19 0320  WBC 7.4 7.6 10.1 8.7 8.2  HGB 16.4* 16.2* 15.3* 14.1 14.4  HCT 53.2* 52.3* 49.8* 46.0 46.7*  PLT 184 184 173 154 157  MCV 94.2 92.7 95.4 95.6 94.7  MCH 29.0 28.7 29.3 29.3 29.2  MCHC 30.8 31.0 30.7 30.7 30.8  RDW 16.7* 16.5* 16.5* 15.9* 15.8*  LYMPHSABS  --  1.8  --   --   --    MONOABS  --  0.4  --   --   --   EOSABS  --  0.2  --   --   --   BASOSABS  --  0.0  --   --   --     Chemistries  Recent Labs  Lab 08/17/19 0510  08/20/19 0633 08/21/19 0527 08/21/19 2333 08/22/19 0415 08/23/19 0320  NA 143   < > 142 142 139 139 140  K 3.3*   < > 4.3 4.0 3.9 4.1 4.2  CL 101   < > 102 100 97* 93* 93*  CO2 28   < > _0 35* 36*  GLUCOSE 165*   < > 93 112* 156* 184* 132*  BUN 19   < > 32* 30* 31* 34* 32*  CREATININE 1.80*   < > 2.73* 2.27* 2.57* 2.69* 2.31*  CALCIUM 9.2   < > 8.1* 7.9* 7.1* 7.6* 7.7*  MG 1.7  --  1.9  --   --  1.7  --   AST 20  --   --   --   --   --   --   ALT 12  --   --   --   --   --   --   ALKPHOS 88  --   --   --   --   --   --   BILITOT 1.7*  --   --   --   --   --   --    < > = values in this interval not displayed.   ------------------------------------------------------------------------------------------------------------------ No results for input(s): CHOL, HDL, LDLCALC, TRIG, CHOLHDL, LDLDIRECT in the last 72 hours.  Lab Results  Component Value Date   HGBA1C 8.2 (H) 05/23/2019   ------------------------------------------------------------------------------------------------------------------ No results for input(s): TSH, T4TOTAL, T3FREE, THYROIDAB in the last 72 hours.  Invalid input(s): FREET3 ------------------------------------------------------------------------------------------------------------------ No results for input(s): VITAMINB12, FOLATE, FERRITIN, TIBC, IRON, RETICCTPCT in the last 72 hours.  Coagulation profile No results for input(s): INR, PROTIME in the last 168 hours.  No results for input(s): DDIMER in the last 72 hours.  Cardiac Enzymes No results for input(s): CKMB, TROPONINI, MYOGLOBIN in the last 168 hours.  Invalid input(s): CK ------------------------------------------------------------------------------------------------------------------    Component Value Date/Time   BNP 1,427.6 (H)  08/17/2019 0457   BNP 43.7 11/07/2011 1436    Inpatient Medications  Scheduled Meds: . allopurinol  100 mg Oral Daily  . aspirin EC  81 mg Oral Daily  . busPIRone  15 mg Oral BID  . Chlorhexidine Gluconate Cloth  6 each Topical Daily  . enoxaparin (LOVENOX) injection  30 mg Subcutaneous Daily  . FLUoxetine  20 mg Oral QHS  . gabapentin  400 mg Oral TID  . insulin aspart  0-9 Units Subcutaneous TID WC  . insulin glargine  60 Units Subcutaneous Daily  . mouth rinse  15 mL Mouth Rinse BID  . nicotine  21 mg Transdermal Daily  . pantoprazole  40 mg Oral Daily  . polyethylene glycol  17 g Oral BID  . potassium chloride SA  20  mEq Oral Daily  . rosuvastatin  20 mg Oral Daily  . sodium chloride flush  3 mL Intravenous Q12H  . topiramate  25 mg Oral BID   Continuous Infusions: . sodium chloride    . sodium chloride Stopped (08/23/19 0629)   PRN Meds:.sodium chloride, acetaminophen **OR** acetaminophen, albuterol, hydrALAZINE, nitroGLYCERIN, ondansetron **OR** ondansetron (ZOFRAN) IV, sodium chloride flush  Micro Results Recent Results (from the past 240 hour(s))  SARS Coronavirus 2 [facility] order, Performed in [facility] hospital lab) Nasopharyngeal Nasopharyngeal Swab     Status: None   Collection Time: 08/17/19 12:38 AM   Specimen: Nasopharyngeal Swab  Result Value Ref Range Status   SARS Coronavirus 2 NEGATIVE NEGATIVE Final    Comment: (NOTE) If result is NEGATIVE SARS-CoV-2 target nucleic acids are NOT DETECTED. The SARS-CoV-2 RNA is generally detectable in upper and lower  respiratory specimens during the acute phase of infection. The lowest  concentration of SARS-CoV-2 viral copies this assay can detect is 250  copies / mL. A negative result does not preclude SARS-CoV-2 infection  and should not be used as the sole basis for treatment or other  patient management decisions.  A negative result may occur with  improper specimen collection / handling, submission of  specimen other  than nasopharyngeal swab, presence of viral mutation(s) within the  areas targeted by this assay, and inadequate number of viral copies  (<250 copies / mL). A negative result must be combined with clinical  observations, patient history, and epidemiological information. If result is POSITIVE SARS-CoV-2 target nucleic acids are DETECTED. The SARS-CoV-2 RNA is generally detectable in upper and lower  respiratory specimens dur ing the acute phase of infection.  Positive  results are indicative of active infection with SARS-CoV-2.  Clinical  correlation with patient history and other diagnostic information is  necessary to determine patient infection status.  Positive results do  not rule out bacterial infection or co-infection with other viruses. If result is PRESUMPTIVE POSTIVE SARS-CoV-2 nucleic acids MAY BE PRESENT.   A presumptive positive result was obtained on the submitted specimen  and confirmed on repeat testing.  While 2019 novel coronavirus  (SARS-CoV-2) nucleic acids may be present in the submitted sample  additional confirmatory testing may be necessary for epidemiological  and / or clinical management purposes  to differentiate between  SARS-CoV-2 and other Sarbecovirus currently known to infect humans.  If clinically indicated additional testing with an alternate test  methodology [phone]) is advised. The SARS-CoV-2 RNA is generally  detectable in upper and lower respiratory sp ecimens during the acute  phase of infection. The expected result is Negative. Fact Sheet for Patients:  StrictlyIdeas.no Fact Sheet for Healthcare Providers: BankingDealers.co.za This test is not yet approved or cleared by the [place] FDA and has been authorized for detection and/or diagnosis of SARS-CoV-2 by FDA under an Emergency Use Authorization (EUA).  This EUA will remain in effect (meaning this test can be used) for the  duration of the COVID-19 declaration under Section 564(b)(1) of the Act, 21 U.S.C. section 360bbb-3(b)(1), unless the authorization is terminated or revoked sooner. Performed at [facility], [place] [address], [place], [place] [address]   Culture, Urine     Status: Abnormal   Collection Time: 08/19/19  3:51 PM   Specimen: Urine, Clean Catch  Result Value Ref Range Status   Specimen Description URINE, CLEAN CATCH  Final   Special Requests   Final    NONE Performed at [facility], [place] [address] [place], [place] [address]    Culture MULTIPLE SPECIES PRESENT, SUGGEST RECOLLECTION (A)  Final   Report Status 08/20/2019 FINAL  Final  MRSA PCR Screening     Status: None   Collection Time: 08/22/19  1:33 PM   Specimen: Nasal Mucosa; Nasopharyngeal  Result Value Ref Range Status   MRSA by PCR NEGATIVE NEGATIVE Final    Comment:        The GeneXpert MRSA Assay (FDA approved for NASAL specimens only), is one component of a comprehensive MRSA colonization surveillance program. It is not intended to diagnose MRSA infection nor to guide or monitor treatment for MRSA infections. Performed at [facility], [place] [address], [place], [place] [address]     Radiology Reports Dg Chest 2 View  Result Date: 08/16/2019 CLINICAL DATA:  63 year old female with chest pain and shortness of breath. EXAM: CHEST - 2 VIEW COMPARISON:  Chest radiograph dated 01/05/2019 FINDINGS: Small left pleural effusion and left lung base atelectasis versus infiltrate. Overall slight interval increase in the size of the left pleural effusion and associated left lung base opacity compared to the prior radiograph. The right lung remains clear. No pneumothorax. Stable cardiac silhouette. No acute osseous pathology. Degenerative changes of the spine. IMPRESSION: Small left pleural effusion and left lung base atelectasis versus infiltrate, increased in size since the prior radiograph. Electronically Signed   By:  Anner Crete M.D.   On: 08/16/2019 19:01   Dg Chest Port 1 View  Result Date: 08/20/2019 CLINICAL DATA:  Central line placement EXAM: PORTABLE CHEST 1 VIEW COMPARISON:  08/16/2019, 01/05/2019 FINDINGS: Insertion of right IJ Swan-Ganz catheter, tip projects over right lower lobe pulmonary artery. Negative for right pneumothorax. Suspected left pleural effusion and left basilar airspace disease. Possible small right effusion. Aortic atherosclerosis. Cardiomegaly. IMPRESSION: 1. Right IJ Swan-Ganz catheter tip projects over right lower lobe pulmonary artery and may be withdrawn for more optimal positioning. Negative for right pneumothorax 2. Cardiomegaly with left pleural effusion and left basilar airspace disease. Probable small right pleural effusion. Electronically Signed   By: Donavan Foil M.D.   On: 08/20/2019 15:59     Phillips Climes M.D on 08/23/2019 at 1:34 PM  Between 7am to 7pm - Pager - [phone]  After 7pm go to [email or web address] - password [facility]  [facility] -  Office  [phone]

## 2019-08-23 NOTE — Evaluation (Signed)
Physical Therapy Evaluation Patient Details Name: [name] MRN: [id number] DOB: 1956-05-20 Today's Date: 08/23/2019   History of Present Illness  63 y.o. female with history of chronic diastolic CHF, hypertension, chronic kidney disease, pulmonary hypertension diabetes mellitus, CVA, CSF rhinorrhea status post reconstruction surgery sleep apnea and ongoing tobacco abuse admitted with SOB, CHF exacerbation.  Difficulty with diuresing with renal function so transferred to ICU.  Clinical Impression  Patient presents with decreased independence with mobility due to decreased strength, decreased balance, decreased endurance and will benefit from skilled PT in the acute setting to allow maximized strength prior to d/c home with family support and follow up HHPT.  Currently min A for ambulation in hallway with walker.  RN reported +2 to get to chair.  PT to follow.     Follow Up Recommendations Home health PT;Supervision/Assistance - 24 hour    Equipment Recommendations  Rolling walker with 5" wheels(possibly 4 wheeled walker with seat)    Recommendations for Other Services       Precautions / Restrictions Precautions Precautions: Fall Restrictions Weight Bearing Restrictions: No      Mobility  Bed Mobility               General bed mobility comments: up in chair with nursing assist  Transfers Overall transfer level: Needs assistance Equipment used: Rolling walker (2 wheeled) Transfers: Sit to/from Stand Sit to Stand: Min assist         General transfer comment: initiated to give lifting help from chair, but pt did not need  Ambulation/Gait Ambulation/Gait assistance: Min assist Gait Distance (Feet): 130 Feet Assistive device: Rolling walker (2 wheeled) Gait Pattern/deviations: Step-through pattern;Decreased stride length;Wide base of support     General Gait Details: moving well with walker, but min support given for safety and cues for slower  pace  Stairs            Wheelchair Mobility    Modified Rankin (Stroke Patients Only)       Balance Overall balance assessment: Needs assistance Sitting-balance support: Feet supported Sitting balance-Leahy Scale: Fair     Standing balance support: Bilateral upper extremity supported Standing balance-Leahy Scale: Poor Standing balance comment: UE support in standing                             Pertinent Vitals/Pain Pain Assessment: Faces Faces Pain Scale: Hurts even more Pain Location: legs Pain Descriptors / Indicators: Aching;Burning Pain Intervention(s): Monitored during session;Repositioned    Home Living Family/patient expects to be discharged to:: Private residence Living Arrangements: Alone Available Help at Discharge: Family Type of Home: House Home Access: Stairs to enter   Technical brewer of Steps: 2 Home Layout: One level Home Equipment: None;Shower seat - built in Additional Comments: could go stay with sister if needed    Prior Function Level of Independence: Independent               Hand Dominance        Extremity/Trunk Assessment   Upper Extremity Assessment Upper Extremity Assessment: Generalized weakness(numbness in fingers)    Lower Extremity Assessment Lower Extremity Assessment: LLE deficits/detail;RLE deficits/detail RLE Deficits / Details: able to lift antigravity and take some resistance, but has tremors similar to asterixis RLE Sensation: history of peripheral neuropathy LLE Deficits / Details: able to lift antigravity and take some resistance, but has tremors similar to asterixis LLE Sensation: history of peripheral neuropathy  Communication   Communication: No difficulties  Cognition Arousal/Alertness: Awake/alert Behavior During Therapy: WFL for tasks assessed/performed Overall Cognitive Status: Within Functional Limits for tasks assessed                                         General Comments General comments (skin integrity, edema, etc.): maintained on 2L O2 with ambulation    Exercises     Assessment/Plan    PT Assessment Patient needs continued PT services  PT Problem List Decreased strength;Decreased activity tolerance;Decreased balance;Decreased mobility;Decreased knowledge of use of DME       PT Treatment Interventions DME instruction;Functional mobility training;Balance training;Patient/family education;Gait training;Therapeutic activities;Stair training;Therapeutic exercise    PT Goals (Current goals can be found in the Care Plan section)  Acute Rehab PT Goals Patient Stated Goal: to return to independent PT Goal Formulation: With patient Time For Goal Achievement: 09/06/19 Potential to Achieve Goals: Good    Frequency Min 3X/week   Barriers to discharge        Co-evaluation               AM-PAC PT "6 Clicks" Mobility  Outcome Measure Help needed turning from your back to your side while in a flat bed without using bedrails?: A Little Help needed moving from lying on your back to sitting on the side of a flat bed without using bedrails?: A Lot Help needed moving to and from a bed to a chair (including a wheelchair)?: A Little Help needed standing up from a chair using your arms (e.g., wheelchair or bedside chair)?: A Little Help needed to walk in hospital room?: A Little Help needed climbing 3-5 steps with a railing? : A Lot 6 Click Score: 16    End of Session Equipment Utilized During Treatment: Oxygen Activity Tolerance: Patient tolerated treatment well Patient left: in chair;with call bell/phone within reach   PT Visit Diagnosis: Other abnormalities of gait and mobility (R26.89);Muscle weakness (generalized) (M62.81)    Time: 3734-2876 PT Time Calculation (min) (ACUTE ONLY): 27 min   Charges:   PT Evaluation $PT Eval Moderate Complexity: 1 Mod PT Treatments $Gait Training: 8-22 mins        Magda Kiel,  PT Acute Rehabilitation Services [phone] 08/23/2019   Reginia Naas 08/23/2019, 11:06 AM

## 2019-08-23 NOTE — TOC Initial Note (Signed)
Transition of Care [facility]) - Initial/Assessment Note    Patient Details  Name: [name] MRN: [id number] Date of Birth: 1956-04-21  Transition of Care [facility]) CM/SW Contact:    Carles Collet, RN Phone Number: 08/23/2019, 4:13 PM  Clinical Narrative:                 Damaris Schooner w patient at bedside. She states that she has a RW at home, and she would be interested in [facility] services. Reviewed [place] list and she would like to use [facility]. Referral accepted by [facility]. Will need orders for [facility] RN and PT at DC.     Expected Discharge Plan: [place] Barriers to Discharge: Continued Medical Work up   Patient Goals and CMS Choice Patient states their goals for this hospitalization and ongoing recovery are:: to go home CMS Medicare.gov Compare Post Acute Care list provided to:: Patient Choice offered to / list presented to : Patient  Expected Discharge Plan and Services Expected Discharge Plan: [place]     Post Acute Care Choice: Home Health   Expected Discharge Date: 08/18/19                         HH Arranged: PT, RN [place] Agency: [place] Date [facility] Agency Contacted: 08/23/19 Time HH Agency Contacted: 1608 Representative spoke with at [place]: Tommi Rumps  Prior Living Arrangements/Services       Do you feel safe going back to the place where you live?: Yes          Current home services: DME(walker)    Activities of Daily Living Home Assistive Devices/Equipment: CBG Meter ADL Screening (condition at time of admission) Patient's cognitive ability adequate to safely complete daily activities?: Yes Is the patient deaf or have difficulty hearing?: No Does the patient have difficulty seeing, even when wearing glasses/contacts?: No Does the patient have difficulty concentrating, remembering, or making decisions?: No Patient able to express need for assistance with ADLs?: Yes Does the patient have difficulty dressing or bathing?:  No Independently performs ADLs?: Yes (appropriate for developmental age) Does the patient have difficulty walking or climbing stairs?: No Weakness of Legs: None Weakness of Arms/Hands: None  Permission Sought/Granted                  Emotional Assessment              Admission diagnosis:  Elevated troponin [R79.89] Acute on chronic congestive heart failure, unspecified heart failure type ([place]) [P82.4] Acute diastolic CHF (congestive heart failure) ([place]) [I50.31] Patient Active Problem List   Diagnosis Date Noted  . Acute diastolic CHF (congestive heart failure) ([place]) 08/16/2019  . Migraine syndrome 07/05/2019  . Edema 05/23/2019  . COPD exacerbation ([place]) 01/05/2019  . Type 2 diabetes mellitus with stage 3 chronic kidney disease, with long-term current use of insulin ([place]) 11/18/2018  . Screening for breast cancer 06/30/2018  . Nicotine dependence, cigarettes, uncomplicated 23/53/6144  . Pulmonary hypertension, unspecified ([place]) 08/14/2017  . Hypertensive heart disease with chronic diastolic congestive heart failure ([place]) 07/26/2017  . Paroxysmal SVT (supraventricular tachycardia) ([place]) 07/26/2017  . COPD  GOLD 0 still smoking 07/13/2017  . Moderate episode of recurrent major depressive disorder ([place]) 07/12/2015  . OSA (obstructive sleep apnea) 10/17/2014  . Pain in joint, multiple sites 09/21/2013  . Carotid artery disease ([place]) 06/03/2013  . Dizziness 02/16/2013  . Depression 12/12/2012  . Uric acid kidney  stone 07/04/2012  . CSF leak from nose 10/19/2011  . Arteriosclerosis of coronary artery 08/31/2011  . Type 2 diabetes mellitus with diabetic polyneuropathy, with long-term current use of insulin ([place]) 08/31/2011  . HLD (hyperlipidemia) 07/02/2011  . Essential hypertension 06/20/2011  . Morbid obesity due to excess calories ([place]) 06/20/2011   PCP:  Luetta Nutting, DO Pharmacy:   [id number] [place], [place] [address] Phone: [phone] Fax: [phone]  [place] [address] Phone: [phone] Fax: [phone] [address], [place] [address] [place] [address] Phone: [phone] Fax: [phone]     Social Determinants of Health (SDOH) Interventions    Readmission Risk Interventions No flowsheet data found.

## 2019-08-23 NOTE — Progress Notes (Addendum)
[facility]    NEPHROLOGY PROGRESS NOTE  SUBJECTIVE: Going to stop milrinone today and d/c swan.  CVP 4, getting some fluid back today.    OBJECTIVE:  Vitals:   08/23/19 0800 08/23/19 0900  BP: (!) 156/69 (!) 166/88  Pulse: 89 86  Resp: (!) 22 15  Temp: (!) 97.3 F (36.3 C) (!) 97.3 F (36.3 C)  SpO2: 97% 96%    Intake/Output Summary (Last 24 hours) at 08/23/2019 1047 Last data filed at 08/23/2019 1000 Gross per 24 hour  Intake 742.57 ml  Output 2985 ml  Net -2242.43 ml      General:  AAOx3 NAD NECK thick neck CV:  Heart RRR  Lungs:  Clear bilaterally with no crackles at bases Abd:  abd SNT/ND with normal BS, obese Extremities: trace LE edema Skin:  No skin rash  MEDICATIONS:  . allopurinol  100 mg Oral Daily  . aspirin EC  81 mg Oral Daily  . busPIRone  15 mg Oral BID  . Chlorhexidine Gluconate Cloth  6 each Topical Daily  . enoxaparin (LOVENOX) injection  30 mg Subcutaneous Daily  . FLUoxetine  20 mg Oral QHS  . gabapentin  400 mg Oral TID  . insulin aspart  0-9 Units Subcutaneous TID WC  . insulin glargine  60 Units Subcutaneous Daily  . mouth rinse  15 mL Mouth Rinse BID  . nicotine  21 mg Transdermal Daily  . pantoprazole  40 mg Oral Daily  . polyethylene glycol  17 g Oral BID  . potassium chloride SA  20 mEq Oral Daily  . rosuvastatin  20 mg Oral Daily  . sodium chloride flush  3 mL Intravenous Q12H  . topiramate  25 mg Oral BID       LABS:   CBC Latest Ref Rng & Units 08/23/2019 08/22/2019 08/21/2019  WBC 4.0 - 10.5 K/uL 8.2 8.7 10.1  Hemoglobin 12.0 - 15.0 g/dL 14.4 14.1 15.3(H)  Hematocrit 36.0 - 46.0 % 46.7(H) 46.0 49.8(H)  Platelets 150 - 400 K/uL 157 154 173    CMP Latest Ref Rng & Units 08/23/2019 08/22/2019 08/21/2019  Glucose 70 - 99 mg/dL 132(H) 184(H) 156(H)  BUN 8 - 23 mg/dL 32(H) 34(H) 31(H)  Creatinine 0.44 - 1.00 mg/dL 2.31(H) 2.69(H) 2.57(H)  Sodium 135 - 145 mmol/L 140 139 139  Potassium 3.5 - 5.1 mmol/L 4.2 4.1  3.9  Chloride 98 - 111 mmol/L 93(L) 93(L) 97(L)  CO2 22 - 32 mmol/L 36(H) 35(H) 30  Calcium 8.9 - 10.3 mg/dL 7.7(L) 7.6(L) 7.1(L)  Total Protein 6.5 - 8.1 g/dL - - -  Total Bilirubin 0.3 - 1.2 mg/dL - - -  Alkaline Phos 38 - 126 U/L - - -  AST 15 - 41 U/L - - -  ALT 0 - 44 U/L - - -    Lab Results  Component Value Date   CALCIUM 7.7 (L) 08/23/2019   CAION 1.18 09/30/2013   PHOS 3.8 01/27/2012       Component Value Date/Time   COLORURINE AMBER (A) 08/18/2019 2150   APPEARANCEUR CLOUDY (A) 08/18/2019 2150   LABSPEC 1.017 08/18/2019 2150   PHURINE 5.0 08/18/2019 2150   GLUCOSEU NEGATIVE 08/18/2019 2150   HGBUR MODERATE (A) 08/18/2019 2150   BILIRUBINUR NEGATIVE 08/18/2019 2150   BILIRUBINUR 1+ 02/07/2019 1433   KETONESUR NEGATIVE 08/18/2019 2150   PROTEINUR >=300 (A) 08/18/2019 2150   UROBILINOGEN 0.2 02/07/2019 1433   UROBILINOGEN 0.2 07/07/2013 1353   NITRITE POSITIVE (  A) 08/18/2019 2150   LEUKOCYTESUR LARGE (A) 08/18/2019 2150      Component Value Date/Time   PHART 7.420 07/30/2017 1338   PCO2ART 45.0 07/30/2017 1338   PO2ART 59.0 (L) 07/30/2017 1338   HCO3 33.0 (H) 07/30/2017 1339   TCO2 35 07/30/2017 1339   ACIDBASEDEF 0.8 12/20/2008 0130   O2SAT 81.9 08/23/2019 0332    No results found for: IRON, TIBC, FERRITIN, IRONPCTSAT     2D echocardiogram from 08/18/2019: Right ventricular with severely reduced systolic function.  Large pleural effusion in the left lateral region.  Severe concentric LVH.  Ejection fraction 30 to 35%.  ASSESSMENT/PLAN:    1.  Chronic kidney disease stage III.  Her baseline serum creatinine appears to run in the mid ones.  Will check urine protein to creatinine ratio.    Prior history of around 100 mg of microalbuminuria.  Does have underlying diabetes.  2.  Acute kidney injury.    Serum creatinine is improving.  Suspect low output cardiac failure playing a role. Getting a little fluid back today.  3.  Hypertensive urgency.  Blood pressures  elevated on admission, but currently stable.  4.  Diabetes mellitus.  Changes per primary team.  5.  Acute on chronic systolic and diastolic congestive heart failure.    Markedly elevated wedge pressures which are improving.  Going to discontinue milrinone and swan today

## 2019-08-23 NOTE — Progress Notes (Addendum)
Advanced Heart Failure Rounding Note   Subjective:    Yesterday IV lasix stopped and po lasix started. Milrinone was cut back to 0.125 mcg. CO-OX 82%.   CVP 4 PA 60/30   PCWP 17  Thermo 6.5/3 SVR 1151  Feeling much better but complaining of dizziness. Complaining neck pain from swan. Denies SOB, orthopnea or PND.   Creatinine slightly better    Objective:   Weight Range:  Vital Signs:   Temp:  [96.8 F (36 C)-98.6 F (37 C)] 97.3 F (36.3 C) (09/15 0800) Pulse Rate:  [78-98] 89 (09/15 0800) Resp:  [12-22] 22 (09/15 0800) BP: (102-184)/(51-116) 156/69 (09/15 0800) SpO2:  [92 %-99 %] 97 % (09/15 0800) Weight:  [109 kg] 109 kg (09/15 0500) Last BM Date: 08/19/19  Weight change: Filed Weights   08/21/19 0500 08/22/19 0400 08/23/19 0500  Weight: 116.2 kg 110.7 kg 109 kg    Intake/Output:   Intake/Output Summary (Last 24 hours) at 08/23/2019 0843 Last data filed at 08/23/2019 0800 Gross per 24 hour  Intake 773.48 ml  Output 3060 ml  Net -2286.52 ml     Physical Exam: CVP 4  General:   Obese woman lying in bed No resp difficulty HEENT: normal Neck: supple. RIJ swan no JVD. Carotids 2+ bilat; no bruits. No lymphadenopathy or thryomegaly appreciated. RIJ swan  Cor: PMI nondisplaced. Regular rate & rhythm. No rubs, gallops or murmurs. Lungs: clear Abdomen: obese soft, nontender, nondistended. No hepatosplenomegaly. No bruits or masses. Good bowel sounds. Extremities: no cyanosis, clubbing, rash, trace edema Neuro: alert & orientedx3, cranial nerves grossly intact. moves all 4 extremities w/o difficulty. Affect pleasant  Telemetry: SR 80s personally reviewed.   Labs: Basic Metabolic Panel: Recent Labs  Lab 08/17/19 0510  08/20/19 6606 08/21/19 0527 08/21/19 2333 08/22/19 0415 08/23/19 0320  NA 143   < > 142 142 139 139 140  K 3.3*   < > 4.3 4.0 3.9 4.1 4.2  CL 101   < > 102 100 97* 93* 93*  CO2 28   < > 27 30 30  35* 36*  GLUCOSE 165*   < > 93 112*  156* 184* 132*  BUN 19   < > 32* 30* 31* 34* 32*  CREATININE 1.80*   < > 2.73* 2.27* 2.57* 2.69* 2.31*  CALCIUM 9.2   < > 8.1* 7.9* 7.1* 7.6* 7.7*  MG 1.7  --  1.9  --   --  1.7  --    < > = values in this interval not displayed.    Liver Function Tests: Recent Labs  Lab 08/17/19 0510  AST 20  ALT 12  ALKPHOS 88  BILITOT 1.7*  PROT 6.4*  ALBUMIN 3.6   No results for input(s): LIPASE, AMYLASE in the last 168 hours. No results for input(s): AMMONIA in the last 168 hours.  CBC: Recent Labs  Lab 08/16/19 1745 08/17/19 0510 08/21/19 0527 08/22/19 0415 08/23/19 0320  WBC 7.4 7.6 10.1 8.7 8.2  NEUTROABS  --  5.1  --   --   --   HGB 16.4* 16.2* 15.3* 14.1 14.4  HCT 53.2* 52.3* 49.8* 46.0 46.7*  MCV 94.2 92.7 95.4 95.6 94.7  PLT 184 184 173 154 157    Cardiac Enzymes: No results for input(s): CKTOTAL, CKMB, CKMBINDEX, TROPONINI in the last 168 hours.  BNP: BNP (last 3 results) Recent Labs    08/17/19 0457  BNP 1,427.6*    ProBNP (last 3 results) Recent Labs  05/23/19 1201  PROBNP 1,442.0*      Other results:  Imaging: No results found.   Medications:     Scheduled Medications: . allopurinol  100 mg Oral Daily  . aspirin EC  81 mg Oral Daily  . busPIRone  15 mg Oral BID  . Chlorhexidine Gluconate Cloth  6 each Topical Daily  . enoxaparin (LOVENOX) injection  30 mg Subcutaneous Daily  . FLUoxetine  20 mg Oral QHS  . gabapentin  400 mg Oral TID  . insulin aspart  0-9 Units Subcutaneous TID WC  . insulin glargine  60 Units Subcutaneous Daily  . mouth rinse  15 mL Mouth Rinse BID  . nicotine  21 mg Transdermal Daily  . pantoprazole  40 mg Oral Daily  . potassium chloride SA  20 mEq Oral Daily  . rosuvastatin  20 mg Oral Daily  . sodium chloride flush  3 mL Intravenous Q12H  . topiramate  25 mg Oral BID    Infusions: . sodium chloride    . sodium chloride 10 mL/hr at 08/23/19 0500  . milrinone 0.125 mcg/kg/min (08/23/19 0500)    PRN  Medications: sodium chloride, acetaminophen **OR** acetaminophen, albuterol, hydrALAZINE, nitroGLYCERIN, ondansetron **OR** ondansetron (ZOFRAN) IV, sodium chloride flush   Assessment/Plan:   1. Cardiogenic shock  - initial swan numbers confirmed biventricular failure with low CI @ 1.6 Resolved. Stop milrinone stoday   2. Acute biventricular HF  - echo EF 25-30%. RV severely HK  - EF newly down. Troponin normal. Clearly not ACS.  - Cath 2018 with minimal CAD - Will not repeat coronary angio currently with AKI - initial swan numbers confirmed biventricular failure with low CI @ 1.6. much improved with milrinone and diuresis.  CVP 4. Volume status low. Hold lasix. Given 250 cc NS back.  - CO-OX 82%. Stop milrinone.  Check CO-OX in the morning. .  - No spiro/dig/Arb  with elevated creatinine.   - Need to consider amyloid. Myeloma panel panel.  - Possible PYP soon   3. Acute on chronic hypoxic respiratory failure - multifactorial  - continue O2.  Continue to wean oxygen. Wean oxygen as needed.   4. PAH with /cor pulmonale - likely who GROUP II & III - Has sleep apnea but has not used CPAP. Will need repeat sleep study.   5. AKI - baseline creatinine 1.4-1.7 - Creatinine peaked at 2.73.  - Creatinine trending down 2.7>2.3   - DM and HTN also likely contributing - avoid nephrotoxic meds  6. COPD with ongoing tobacco use - discussed need to quit  7. Morbid obesity - need weight loss  8. OSA - needs CPAP.  Repeat sleep study.   9. DM2 - continue SSI  Consult PT. OOB. Transfer to progressive.  Ambulate check sats. May need home oxygen.    Length of Stay: [place] NP-C  08/23/2019, 8:43 AM  Advanced Heart Failure Team Pager [phone] (M-F; 7a - 4p)  Please contact [facility] for night-coverage after hours (4p -7a ) and weekends on [email or web address]  Agree with above  She remains on milrinone 0.125. Co-ox 82% Swan numbers with low CVP (4) and normal  cardiac output. Feels a bit dizzy. Denies SOB. Creatinine improving slowly  Obese woman lying in bed flushed NAD HEENT: normal Neck: supple. RIJ swan. Carotids 2+ bilat; no bruits. No lymphadenopathy or thryomegaly appreciated. Cor: PMI nondisplaced. Regular rate & rhythm. No rubs, gallops or murmurs. Lungs: coarse Abdomen: obese soft, nontender, nondistended.  No hepatosplenomegaly. No bruits or masses. Good bowel sounds. Extremities: no cyanosis, clubbing, rash, edema Neuro: alert & orientedx3, cranial nerves grossly intact. moves all 4 extremities w/o difficulty. Affect pleasant  RV failure improved with milrinone. Co-ox and CVP now improved. Renal function improving. Stop milrinone.  Will pull swan. Keep introducer and follow CVP and co-ox. Give 250 NS. Hold diuretics today.   Discussed need for: 1. Smoking cessation 2. Repeat sleep study -> CPAP 3. Fluid restriction 4. Diet/weight loss.   CRITICAL CARE Performed by: Glori Bickers  Total critical care time: 35 minutes  Critical care time was exclusive of separately billable procedures and treating other patients.  Critical care was necessary to treat or prevent imminent or life-threatening deterioration.  Critical care was time spent personally by me (independent of midlevel providers or residents) on the following activities: development of treatment plan with patient and/or surrogate as well as nursing, discussions with consultants, evaluation of patient's response to treatment, examination of patient, obtaining history from patient or surrogate, ordering and performing treatments and interventions, ordering and review of laboratory studies, ordering and review of radiographic studies, pulse oximetry and re-evaluation of patient's condition.    Glori Bickers, MD  11:31 AM

## 2019-08-24 LAB — BASIC METABOLIC PANEL
Anion gap: 12 (ref 5–15)
BUN: 29 mg/dL — ABNORMAL HIGH (ref 8–23)
CO2: 32 mmol/L (ref 22–32)
Calcium: 8 mg/dL — ABNORMAL LOW (ref 8.9–10.3)
Chloride: 95 mmol/L — ABNORMAL LOW (ref 98–111)
Creatinine, Ser: 1.79 mg/dL — ABNORMAL HIGH (ref 0.44–1.00)
GFR calc Af Amer: 35 mL/min — ABNORMAL LOW (ref >60)
GFR calc non Af Amer: 30 mL/min — ABNORMAL LOW (ref >60)
Glucose, Bld: 126 mg/dL — ABNORMAL HIGH (ref 70–99)
Potassium: 4.1 mmol/L (ref 3.5–5.1)
Sodium: 139 mmol/L (ref 135–145)

## 2019-08-24 LAB — GLUCOSE, CAPILLARY
Glucose-Capillary: 122 mg/dL — ABNORMAL HIGH (ref 70–99)
Glucose-Capillary: 119 mg/dL — ABNORMAL HIGH (ref 70–99)
Glucose-Capillary: 137 mg/dL — ABNORMAL HIGH (ref 70–99)
Glucose-Capillary: 147 mg/dL — ABNORMAL HIGH (ref 70–99)

## 2019-08-24 LAB — COOXEMETRY PANEL
Carboxyhemoglobin: 1.9 % — ABNORMAL HIGH (ref 0.5–1.5)
Methemoglobin: 0.6 % (ref 0.0–1.5)
O2 Saturation: 85.6 %
Total hemoglobin: 9.4 g/dL — ABNORMAL LOW (ref 12.0–16.0)

## 2019-08-24 LAB — CBC
HCT: 51.1 % — ABNORMAL HIGH (ref 36.0–46.0)
Hemoglobin: 15.5 g/dL — ABNORMAL HIGH (ref 12.0–15.0)
MCH: 28.8 pg (ref 26.0–34.0)
MCHC: 30.3 g/dL (ref 30.0–36.0)
MCV: 94.8 fL (ref 80.0–100.0)
Platelets: 163 10*3/uL (ref 150–400)
RBC: 5.39 MIL/uL — ABNORMAL HIGH (ref 3.87–5.11)
RDW: 15.9 % — ABNORMAL HIGH (ref 11.5–15.5)
WBC: 7.7 10*3/uL (ref 4.0–10.5)
nRBC: 0 % (ref 0.0–0.2)

## 2019-08-24 LAB — MAGNESIUM: Magnesium: 2.2 mg/dL (ref 1.7–2.4)

## 2019-08-24 MED ORDER — ENOXAPARIN SODIUM 60 MG/0.6ML ~~LOC~~ SOLN
0.5000 mg/kg | Freq: Every day | SUBCUTANEOUS | Status: DC
  Administered 2019-08-24 – 2019-08-26 (×3): 55 mg via SUBCUTANEOUS
  Filled 2019-08-24 (×2): qty 0.6
  Filled 2019-08-24: qty 0.55

## 2019-08-24 MED ORDER — GABAPENTIN 100 MG PO CAPS: 200.0000 mg | ORAL_CAPSULE | Freq: Three times a day (TID) | ORAL | Status: DC

## 2019-08-24 MED ORDER — PREGABALIN 75 MG PO CAPS
75.0000 mg | ORAL_CAPSULE | Freq: Two times a day (BID) | ORAL | Status: DC
  Administered 2019-08-24 – 2019-08-26 (×5): 75 mg via ORAL
  Filled 2019-08-24 (×5): qty 1

## 2019-08-24 MED ORDER — SODIUM CHLORIDE 0.9 % IV BOLUS
250.0000 mL | Freq: Once | INTRAVENOUS | Status: AC
  Administered 2019-08-24: 250 mL via INTRAVENOUS

## 2019-08-24 NOTE — Progress Notes (Addendum)
Advanced Heart Failure Rounding Note   Subjective:    Yesterday diuretics held and she was given 250 cc NS due to over diuresis.   Yesterday hypertensive and received a total of 30 mg IV hydralazine.   CO-OX remains stable off milrinone.   Having ongoing dizziness. Denies SOB.    Objective:   Weight Range:  Vital Signs:   Temp:  [97.3 F (36.3 C)-97.6 F (36.4 C)] 97.6 F (36.4 C) (09/16 0813) Pulse Rate:  [70-95] 78 (09/16 0800) Resp:  [14-26] 15 (09/16 0800) BP: (111-191)/(53-88) 157/74 (09/16 0800) SpO2:  [84 %-99 %] 97 % (09/16 0800) Weight:  [108.5 kg] 108.5 kg (09/16 0500) Last BM Date: 08/23/19  Weight change: Filed Weights   08/22/19 0400 08/23/19 0500 08/24/19 0500  Weight: 110.7 kg 109 kg 108.5 kg    Intake/Output:   Intake/Output Summary (Last 24 hours) at 08/24/2019 0938 Last data filed at 08/24/2019 0600 Gross per 24 hour  Intake 730 ml  Output 925 ml  Net -195 ml     Physical Exam: General:  Obese woman Sitting in the chair. No resp difficulty HEENT: normal Neck: supple. no JVD. Carotids 2+ bilat; no bruits. No lymphadenopathy or thryomegaly appreciated. RIJ introducer Cor: PMI nondisplaced. Regular rate & rhythm. No rubs, gallops or murmurs. Lungs: clear decreased BS throughout Abdomen: obese soft, nontender, nondistended. No hepatosplenomegaly. No bruits or masses. Good bowel sounds. Extremities: no cyanosis, clubbing, rash, edema Neuro: alert & oriented x 3, cranial nerves grossly intact. moves all 4 extremities w/o difficulty. Affect pleasant    Telemetry:  Labs: Basic Metabolic Panel: Recent Labs  Lab 08/20/19 0633 08/21/19 0527 08/21/19 2333 08/22/19 0415 08/23/19 0320 08/24/19 0435  NA 142 142 139 139 140 139  K 4.3 4.0 3.9 4.1 4.2 4.1  CL 102 100 97* 93* 93* 95*  CO2 27 30 30  35* 36* 32  GLUCOSE 93 112* 156* 184* 132* 126*  BUN 32* 30* 31* 34* 32* 29*  CREATININE 2.73* 2.27* 2.57* 2.69* 2.31* 1.79*  CALCIUM 8.1* 7.9*  7.1* 7.6* 7.7* 8.0*  MG 1.9  --   --  1.7  --  2.2    Liver Function Tests: No results for input(s): AST, ALT, ALKPHOS, BILITOT, PROT, ALBUMIN in the last 168 hours. No results for input(s): LIPASE, AMYLASE in the last 168 hours. No results for input(s): AMMONIA in the last 168 hours.  CBC: Recent Labs  Lab 08/21/19 0527 08/22/19 0415 08/23/19 0320 08/24/19 0435  WBC 10.1 8.7 8.2 7.7  HGB 15.3* 14.1 14.4 15.5*  HCT 49.8* 46.0 46.7* 51.1*  MCV 95.4 95.6 94.7 94.8  PLT 173 154 157 163    Cardiac Enzymes: No results for input(s): CKTOTAL, CKMB, CKMBINDEX, TROPONINI in the last 168 hours.  BNP: BNP (last 3 results) Recent Labs    08/17/19 0457  BNP 1,427.6*    ProBNP (last 3 results) Recent Labs    05/23/19 1201  PROBNP 1,442.0*      Other results:  Imaging: No results found.   Medications:     Scheduled Medications: . allopurinol  100 mg Oral Daily  . aspirin EC  81 mg Oral Daily  . busPIRone  15 mg Oral BID  . Chlorhexidine Gluconate Cloth  6 each Topical Daily  . enoxaparin (LOVENOX) injection  30 mg Subcutaneous Daily  . FLUoxetine  20 mg Oral QHS  . gabapentin  400 mg Oral TID  . insulin aspart  0-9 Units Subcutaneous TID WC  .  insulin glargine  60 Units Subcutaneous Daily  . mouth rinse  15 mL Mouth Rinse BID  . nicotine  21 mg Transdermal Daily  . pantoprazole  40 mg Oral Daily  . polyethylene glycol  17 g Oral BID  . potassium chloride SA  20 mEq Oral Daily  . rosuvastatin  20 mg Oral Daily  . sodium chloride flush  3 mL Intravenous Q12H  . topiramate  25 mg Oral BID    Infusions: . sodium chloride    . sodium chloride Stopped (08/23/19 0629)    PRN Medications: sodium chloride, acetaminophen **OR** acetaminophen, albuterol, hydrALAZINE, nitroGLYCERIN, ondansetron **OR** ondansetron (ZOFRAN) IV, sodium chloride flush   Assessment/Plan:   1. Cardiogenic shock  - initial swan numbers confirmed biventricular failure with low CI @  1.6 Resolved.   2. Acute biventricular HF  - echo EF 25-30%. RV severely HK  - EF newly down. Troponin normal. Clearly not ACS.  - Cath 2018 with minimal CAD - Will not repeat coronary angio currently with AKI - initial swan numbers confirmed biventricular failure with low CI @ 1.6. much improved with milrinone and diuresis.  CO-OX stable off milrinone. - Dizzy today. Check orthostatics. Does not appear volume overloaded. Hold diuretics.  - Hold off on bb. Can consider bisoprolol at some point.  - Over night hypertensive and received IV hydralazine. Add 12.5 mg hydralazine three times a day + 15 mg imdur. As renal function improves will switch to ARB.  - No spiro/dig/Arb with elevated creatinine.   - Need to consider amyloid. Myeloma panel- no M spike noted .  - Possible PYP soon   3. Acute on chronic hypoxic respiratory failure - multifactorial  - continue O2.  Continue to wean oxygen. Wean oxygen as needed.   4. PAH with /cor pulmonale - likely who GROUP II & III - Has sleep apnea but has not used CPAP.  -Will need repeat sleep study.   5. AKI - baseline creatinine 1.4-1.7 - Creatinine peaked at 2.73.  - Creatinine trending down 2.7>2.3>1.8  - DM and HTN also likely contributing - avoid nephrotoxic meds  6. COPD with ongoing tobacco use - discussed need to quit  7. Morbid obesity - need weight loss  8. OSA - needs CPAP.  Repeat sleep study.   9. DM2 - continue SSI  10. Deconditioning PT consulted with recommendations for HHPT.   11. HTN - BP low today  Move to progressive. Should be able to take out swan.    Length of Stay: [place] NP-C  08/24/2019, 9:38 AM  Advanced Heart Failure Team Pager [phone] (M-F; 7a - 4p)  Please contact [facility] for night-coverage after hours (4p -7a ) and weekends on [email or web address]  Patient seen and examined with the above-signed Advanced Practice Provider and/or Housestaff. I personally reviewed laboratory  data, imaging studies and relevant notes. I independently examined the patient and formulated the important aspects of the plan. I have edited the note to reflect any of my changes or salient points. I have personally discussed the plan with the patient and/or family.  Remains weak and dizzy but otherwise improving. Denies SOB, orthopnea or PNd. CVP ok. Creatinine nearing baseline. Co-ox ok. Can pull introducer, Will check orthostatics. If orthostatic will give more IVF.   Plan PYP scan tomorrow. Will need outpatient sleep study   Can got to SDU.

## 2019-08-24 NOTE — Progress Notes (Signed)
Physical Therapy Treatment Patient Details Name: [name] MRN: [id number] DOB: 27-Jun-1956 Today's Date: 08/24/2019    History of Present Illness 63 y.o. female with history of chronic diastolic CHF, hypertension, chronic kidney disease, pulmonary hypertension diabetes mellitus, CVA, CSF rhinorrhea status post reconstruction surgery sleep apnea and ongoing tobacco abuse admitted with SOB, CHF exacerbation.  Difficulty with diuresing with renal function so transferred to ICU.    PT Comments    Pt performed supine to sit, sit to stand and 2 bouts of gt training.  She was limited initially by dizziness.  Orthostatic vital taken.  Pt continues to fatigue quickly and required cues for pacing.  Plan next session for stair training and to review safety with rollator.  HHPT remains appropriate f/u at d/c.     Orthostatic VS for the past 24 hrs:  BP- Lying Pulse- Lying BP- Sitting Pulse- Sitting BP- Standing at 0 minutes Pulse- Standing at 0 minutes  08/24/19 0949 165/84 87 153/79 88 140/72 89      Follow Up Recommendations  Home health PT;Supervision/Assistance - 24 hour     Equipment Recommendations  Other (comment)(4 wheeled walker with a seat.)    Recommendations for Other Services       Precautions / Restrictions Precautions Precautions: Fall Restrictions Weight Bearing Restrictions: Yes    Mobility  Bed Mobility Overal bed mobility: Needs Assistance Bed Mobility: Supine to Sit     Supine to sit: Min assist     General bed mobility comments: Min assistance to elevate trunk into sitting, increased time and effort on patient's behalf with + rail use.  Transfers Overall transfer level: Needs assistance Equipment used: 4-wheeled walker Transfers: Sit to/from Stand Sit to Stand: Min guard         General transfer comment: min guard for safety with cues for hand placement.  Also educated on locking rollator brakes with performing transfer  activities.  Ambulation/Gait Ambulation/Gait assistance: Min assist Gait Distance (Feet): 150 Feet(+ 130 ft.  Seated rest break in between trials.) Assistive device: 4-wheeled walker Gait Pattern/deviations: Step-through pattern;Decreased stride length;Wide base of support     General Gait Details: Pt moving well with rollator she continues to fatigue quickly and required cues for pacing and rest periods.   Stairs             Wheelchair Mobility    Modified Rankin (Stroke Patients Only)       Balance Overall balance assessment: Needs assistance Sitting-balance support: Feet supported Sitting balance-Leahy Scale: Fair     Standing balance support: Bilateral upper extremity supported Standing balance-Leahy Scale: Fair Standing balance comment: UE support in standing                            Cognition Arousal/Alertness: Awake/alert Behavior During Therapy: WFL for tasks assessed/performed Overall Cognitive Status: Within Functional Limits for tasks assessed                                        Exercises      General Comments        Pertinent Vitals/Pain Pain Assessment: Faces Faces Pain Scale: Hurts even more Pain Location: knees Pain Descriptors / Indicators: Aching;Burning Pain Intervention(s): Monitored during session    Home Living  Prior Function            PT Goals (current goals can now be found in the care plan section) Acute Rehab PT Goals Patient Stated Goal: to return to independent Potential to Achieve Goals: Good Progress towards PT goals: Progressing toward goals    Frequency    Min 3X/week      PT Plan Current plan remains appropriate    Co-evaluation              AM-PAC PT "6 Clicks" Mobility   Outcome Measure  Help needed turning from your back to your side while in a flat bed without using bedrails?: A Little Help needed moving from lying on your back  to sitting on the side of a flat bed without using bedrails?: A Little Help needed moving to and from a bed to a chair (including a wheelchair)?: A Little Help needed standing up from a chair using your arms (e.g., wheelchair or bedside chair)?: A Little Help needed to walk in hospital room?: A Little Help needed climbing 3-5 steps with a railing? : A Little 6 Click Score: 18    End of Session Equipment Utilized During Treatment: Oxygen Activity Tolerance: Patient tolerated treatment well Patient left: in chair;with call bell/phone within reach Nurse Communication: Mobility status(orthostatic vitals.) PT Visit Diagnosis: Other abnormalities of gait and mobility (R26.89);Muscle weakness (generalized) (M62.81)     Time: 4076-8088 PT Time Calculation (min) (ACUTE ONLY): 37 min  Charges:  $Gait Training: 8-22 mins $Therapeutic Activity: 8-22 mins                     [name], PTA Acute Rehabilitation Services Pager [phone] Office [phone]     [name] 08/24/2019, 3:05 PM

## 2019-08-24 NOTE — Progress Notes (Signed)
PROGRESS NOTE                                                                                                                                                                                                             Patient Demographics:    [name], is a 63 y.o. female, DOB - 1955/12/22, ZOX:[id number]  Admit date - 08/16/2019   Admitting Physician Rise Patience, MD  Outpatient Primary MD for the patient is Luetta Nutting, DO  LOS - 8   Chief Complaint  Patient presents with  . Shortness of Breath  . Chest Pain       Brief Narrative    63 y.o. female with history of chronic diastolic CHF, hypertension, chronic kidney disease, pulmonary hypertension diabetes mellitus, CVA, CSF rhinorrhea status post reconstruction surgery sleep apnea and ongoing tobacco abuse presents to the ER because of increasing shortness of breath over the last 3 weeks, work-up significant for volume overload, admitted for IV diuresis.  Patient was on IV Lasix initially, but this has been held as her creatinine kept trending up despite holding her IV diuresis, cardiology were consulted to assist with diuresis, and renal consulted to assist with worsening renal function.  He was transferred to CCU 9/12, for initiation of milrinone drip, and close monitoring with Swan-Ganz.  She is with improvement of volume status, renal function.   Subjective:    [name] today reports she is being forgetful, more sleepy during the daytime, denies any chest pain, fever or chills .   Assessment  & Plan :    Principal Problem:   Acute diastolic CHF (congestive heart failure) (HCC) Active Problems:   Essential hypertension   Morbid obesity due to excess calories (HCC)   HLD (hyperlipidemia)   OSA (obstructive sleep apnea)   COPD  GOLD 0 still smoking   Pulmonary hypertension, unspecified (HCC)   Type 2 diabetes mellitus with stage 3 chronic kidney disease, with  long-term current use of insulin (HCC)  Acute on chronic combined systolic/diastolic CHF, and biventricular failure/cardiogenic shock. -Most recent echo and 2018 showing EF was 60 to 65% with grade 2 diastolic dysfunction - repeat echo this admission showing EF 30-35% w/ diffuse hypokinesis, severe concentric LVH and severely educed RV systolic function.  -Initial Swan cath numbers confirmed biventricular failure with low CL at 1.6, this  has resolved -Significantly worsening renal function, despite minimal diuresis/holding diuresis on admission. -CHF team input greatly appreciated, patient required admission to ICU, with [facility] cath monitoring, on milrinone drip, Lasix drip, volume management per CHF team, so far -10 L during hospital stay. -Currently appears to be dry, over diuresed, so she is receiving fluids per CHF team. - Amyloidosis ?  multiple myeloma work-up sent by CHF team, no M spike  Pulmonary artery hypertension/cor pulmonale -We will need repeat sleep study, has sleep apnea but has not been using CPAP in the past.  AKI on Chronic kidney disease stage III  -Baseline creatinine around 1.5, peaked at 2.2, improved with milrinone drip, most recent is 1.7 . -Renal input greatly appreciated  Hypertensive urgency -patient blood pressure  elevated in ED.   Diabetes mellitus type 2 -Patient on significant Lantus dose at home 120 units subcu daily, she has been hypoglycemic, her CBG currently controlled on 60 units twice daily and sliding scale .  Sleep apnea  -We will need repeat sleep study as an outpatient  UTI -Received Total of 3 days of Rocephin  COPD  - not actively wheezing continue inhalers.   Tobacco abuse  - advised about quitting.  Hyperlipidemia on statins.  History of CVA  - on statins and antiplatelet agents.  History of gout  - on allopurinol.  History of depression and anxiety  - on Prozac and BuSpar.  Reports she is forgetful, with excessive  sleepiness, will change gabapentin to Lyrica   COVID-19 Labs  No results for input(s): DDIMER, FERRITIN, LDH, CRP in the last 72 hours.  Lab Results  Component Value Date   [place] NEGATIVE 08/17/2019     Code Status : Full code  Family Communication  : Discussed with sister via phone.  Disposition Plan  : Home once stable  Consults  : cardiology, Nephrology.  Advanced CHF team  Procedures  : Swan catheter 9/12>9/15  DVT Prophylaxis  : Subcu Lovenox  Lab Results  Component Value Date   PLT 163 08/24/2019    Antibiotics  :    Anti-infectives (From admission, onward)   Start     Dose/Rate Route Frequency Ordered Stop   08/19/19 0815  cefTRIAXone (ROCEPHIN) 1 g in sodium chloride 0.9 % 100 mL IVPB  Status:  Discontinued     1 g 200 mL/hr over 30 Minutes Intravenous Every 24 hours 08/19/19 0814 08/21/19 1256        Objective:   Vitals:   08/24/19 1000 08/24/19 1100 08/24/19 1200 08/24/19 1223  BP: (!) 152/78   (!) 165/72  Pulse: 76 80 89 88  Resp: (!) 22     Temp:  97.6 F (36.4 C)    TempSrc:  Oral    SpO2: 97% 96% 92% 92%  Weight:      Height:        Wt Readings from Last 3 Encounters:  08/24/19 108.5 kg  08/16/19 116.6 kg  07/04/19 113.3 kg     Intake/Output Summary (Last 24 hours) at 08/24/2019 1415 Last data filed at 08/24/2019 0600 Gross per 24 hour  Intake 240 ml  Output 775 ml  Net -535 ml     Physical Exam  Awake Alert, Oriented X 3, No new F.N deficits, Normal affect Symmetrical Chest wall movement, Good air movement bilaterally, CTAB RRR,No Gallops,Rubs or new Murmurs, No Parasternal Heave +ve B.Sounds, Abd Soft, No tenderness, No rebound - guarding or rigidity. No Cyanosis, Clubbing ,trace edema, No new Rash  or bruise      Data Review:    CBC Recent Labs  Lab 08/21/19 0527 08/22/19 0415 08/23/19 0320 08/24/19 0435  WBC 10.1 8.7 8.2 7.7  HGB 15.3* 14.1 14.4 15.5*  HCT 49.8* 46.0 46.7* 51.1*  PLT 173 154 157 163   MCV 95.4 95.6 94.7 94.8  MCH 29.3 29.3 29.2 28.8  MCHC 30.7 30.7 30.8 30.3  RDW 16.5* 15.9* 15.8* 15.9*    Chemistries  Recent Labs  Lab 08/20/19 0633 08/21/19 0527 08/21/19 2333 08/22/19 0415 08/23/19 0320 08/24/19 0435  NA 142 142 139 139 140 139  K 4.3 4.0 3.9 4.1 4.2 4.1  CL 102 100 97* 93* 93* 95*  CO2 27 30 30  35* 36* 32  GLUCOSE 93 112* 156* 184* 132* 126*  BUN 32* 30* 31* 34* 32* 29*  CREATININE 2.73* 2.27* 2.57* 2.69* 2.31* 1.79*  CALCIUM 8.1* 7.9* 7.1* 7.6* 7.7* 8.0*  MG 1.9  --   --  1.7  --  2.2   ------------------------------------------------------------------------------------------------------------------ No results for input(s): CHOL, HDL, LDLCALC, TRIG, CHOLHDL, LDLDIRECT in the last 72 hours.  Lab Results  Component Value Date   HGBA1C 8.2 (H) 05/23/2019   ------------------------------------------------------------------------------------------------------------------ No results for input(s): TSH, T4TOTAL, T3FREE, THYROIDAB in the last 72 hours.  Invalid input(s): FREET3 ------------------------------------------------------------------------------------------------------------------ No results for input(s): VITAMINB12, FOLATE, FERRITIN, TIBC, IRON, RETICCTPCT in the last 72 hours.  Coagulation profile No results for input(s): INR, PROTIME in the last 168 hours.  No results for input(s): DDIMER in the last 72 hours.  Cardiac Enzymes No results for input(s): CKMB, TROPONINI, MYOGLOBIN in the last 168 hours.  Invalid input(s): CK ------------------------------------------------------------------------------------------------------------------    Component Value Date/Time   BNP 1,427.6 (H) 08/17/2019 0457   BNP 43.7 11/07/2011 1436    Inpatient Medications  Scheduled Meds: . allopurinol  100 mg Oral Daily  . aspirin EC  81 mg Oral Daily  . busPIRone  15 mg Oral BID  . Chlorhexidine Gluconate Cloth  6 each Topical Daily  . enoxaparin  (LOVENOX) injection  0.5 mg/kg Subcutaneous Daily  . FLUoxetine  20 mg Oral QHS  . insulin aspart  0-9 Units Subcutaneous TID WC  . insulin glargine  60 Units Subcutaneous Daily  . mouth rinse  15 mL Mouth Rinse BID  . nicotine  21 mg Transdermal Daily  . pantoprazole  40 mg Oral Daily  . polyethylene glycol  17 g Oral BID  . potassium chloride SA  20 mEq Oral Daily  . pregabalin  75 mg Oral BID  . rosuvastatin  20 mg Oral Daily  . sodium chloride flush  3 mL Intravenous Q12H  . topiramate  25 mg Oral BID   Continuous Infusions: . sodium chloride    . sodium chloride Stopped (08/23/19 0629)   PRN Meds:.sodium chloride, acetaminophen **OR** acetaminophen, albuterol, hydrALAZINE, nitroGLYCERIN, ondansetron **OR** ondansetron (ZOFRAN) IV, sodium chloride flush  Micro Results Recent Results (from the past 240 hour(s))  SARS Coronavirus 2 [facility] order, Performed in [facility] hospital lab) Nasopharyngeal Nasopharyngeal Swab     Status: None   Collection Time: 08/17/19 12:38 AM   Specimen: Nasopharyngeal Swab  Result Value Ref Range Status   SARS Coronavirus 2 NEGATIVE NEGATIVE Final    Comment: (NOTE) If result is NEGATIVE SARS-CoV-2 target nucleic acids are NOT DETECTED. The SARS-CoV-2 RNA is generally detectable in upper and lower  respiratory specimens during the acute phase of infection. The lowest  concentration of SARS-CoV-2 viral copies this assay  can detect is 250  copies / mL. A negative result does not preclude SARS-CoV-2 infection  and should not be used as the sole basis for treatment or other  patient management decisions.  A negative result may occur with  improper specimen collection / handling, submission of specimen other  than nasopharyngeal swab, presence of viral mutation(s) within the  areas targeted by this assay, and inadequate number of viral copies  (<250 copies / mL). A negative result must be combined with clinical  observations, patient history, and  epidemiological information. If result is POSITIVE SARS-CoV-2 target nucleic acids are DETECTED. The SARS-CoV-2 RNA is generally detectable in upper and lower  respiratory specimens dur ing the acute phase of infection.  Positive  results are indicative of active infection with SARS-CoV-2.  Clinical  correlation with patient history and other diagnostic information is  necessary to determine patient infection status.  Positive results do  not rule out bacterial infection or co-infection with other viruses. If result is PRESUMPTIVE POSTIVE SARS-CoV-2 nucleic acids MAY BE PRESENT.   A presumptive positive result was obtained on the submitted specimen  and confirmed on repeat testing.  While 2019 novel coronavirus  (SARS-CoV-2) nucleic acids may be present in the submitted sample  additional confirmatory testing may be necessary for epidemiological  and / or clinical management purposes  to differentiate between  SARS-CoV-2 and other Sarbecovirus currently known to infect humans.  If clinically indicated additional testing with an alternate test  methodology [phone]) is advised. The SARS-CoV-2 RNA is generally  detectable in upper and lower respiratory sp ecimens during the acute  phase of infection. The expected result is Negative. Fact Sheet for Patients:  StrictlyIdeas.no Fact Sheet for Healthcare Providers: BankingDealers.co.za This test is not yet approved or cleared by the [place] FDA and has been authorized for detection and/or diagnosis of SARS-CoV-2 by FDA under an Emergency Use Authorization (EUA).  This EUA will remain in effect (meaning this test can be used) for the duration of the COVID-19 declaration under Section 564(b)(1) of the Act, 21 U.S.C. section 360bbb-3(b)(1), unless the authorization is terminated or revoked sooner. Performed at [facility], [place] [address], [place], [place] [address]   Culture,  Urine     Status: Abnormal   Collection Time: 08/19/19  3:51 PM   Specimen: Urine, Clean Catch  Result Value Ref Range Status   Specimen Description URINE, CLEAN CATCH  Final   Special Requests   Final    NONE Performed at [facility], [place] [address] [place],  [address]    Culture MULTIPLE SPECIES PRESENT, SUGGEST RECOLLECTION (A)  Final   Report Status 08/20/2019 FINAL  Final  MRSA PCR Screening     Status: None   Collection Time: 08/22/19  1:33 PM   Specimen: Nasal Mucosa; Nasopharyngeal  Result Value Ref Range Status   MRSA by PCR NEGATIVE NEGATIVE Final    Comment:        The GeneXpert MRSA Assay (FDA approved for NASAL specimens only), is one component of a comprehensive MRSA colonization surveillance program. It is not intended to diagnose MRSA infection nor to guide or monitor treatment for MRSA infections. Performed at [facility], [place] [address], [place],  [address]     Radiology Reports Dg Chest 2 View  Result Date: 08/16/2019 CLINICAL DATA:  63 year old female with chest pain and shortness of breath. EXAM: CHEST - 2 VIEW COMPARISON:  Chest radiograph dated 01/05/2019 FINDINGS:  Small left pleural effusion and left lung base atelectasis versus infiltrate. Overall slight interval increase in the size of the left pleural effusion and associated left lung base opacity compared to the prior radiograph. The right lung remains clear. No pneumothorax. Stable cardiac silhouette. No acute osseous pathology. Degenerative changes of the spine. IMPRESSION: Small left pleural effusion and left lung base atelectasis versus infiltrate, increased in size since the prior radiograph. Electronically Signed   By: Anner Crete M.D.   On: 08/16/2019 19:01   Dg Chest Port 1 View  Result Date: 08/20/2019 CLINICAL DATA:  Central line placement EXAM: PORTABLE CHEST 1 VIEW COMPARISON:  08/16/2019, 01/05/2019 FINDINGS: Insertion of right IJ Swan-Ganz catheter, tip  projects over right lower lobe pulmonary artery. Negative for right pneumothorax. Suspected left pleural effusion and left basilar airspace disease. Possible small right effusion. Aortic atherosclerosis. Cardiomegaly. IMPRESSION: 1. Right IJ Swan-Ganz catheter tip projects over right lower lobe pulmonary artery and may be withdrawn for more optimal positioning. Negative for right pneumothorax 2. Cardiomegaly with left pleural effusion and left basilar airspace disease. Probable small right pleural effusion. Electronically Signed   By: Donavan Foil M.D.   On: 08/20/2019 15:59     Phillips Climes M.D on 08/24/2019 at 2:15 PM  Between 7am to 7pm - Pager - [phone]  After 7pm go to [email or web address] - password [email or web address]  [facility] -  Office  [phone]

## 2019-08-24 NOTE — Progress Notes (Addendum)
[facility]    NEPHROLOGY PROGRESS NOTE  SUBJECTIVE: Cr improved to 1.79 this AM.  Notes she's sleepier than usual and wonders if meds can contribute.      OBJECTIVE:  Vitals:   08/24/19 0800 08/24/19 0813  BP: (!) 157/74   Pulse: 78   Resp: 15   Temp:  97.6 F (36.4 C)  SpO2: 97%     Intake/Output Summary (Last 24 hours) at 08/24/2019 0859 Last data filed at 08/24/2019 0600 Gross per 24 hour  Intake 730 ml  Output 925 ml  Net -195 ml      General:  Sleeping, easily arousable in chair NECK thick neck CV:  Heart RRR  Lungs:  Clear bilaterally with no crackles at bases Abd:  obese Extremities: trace LE edema Skin:  No skin rash  MEDICATIONS:  . allopurinol  100 mg Oral Daily  . aspirin EC  81 mg Oral Daily  . busPIRone  15 mg Oral BID  . Chlorhexidine Gluconate Cloth  6 each Topical Daily  . enoxaparin (LOVENOX) injection  30 mg Subcutaneous Daily  . FLUoxetine  20 mg Oral QHS  . gabapentin  400 mg Oral TID  . insulin aspart  0-9 Units Subcutaneous TID WC  . insulin glargine  60 Units Subcutaneous Daily  . mouth rinse  15 mL Mouth Rinse BID  . nicotine  21 mg Transdermal Daily  . pantoprazole  40 mg Oral Daily  . polyethylene glycol  17 g Oral BID  . potassium chloride SA  20 mEq Oral Daily  . rosuvastatin  20 mg Oral Daily  . sodium chloride flush  3 mL Intravenous Q12H  . topiramate  25 mg Oral BID       LABS:   CBC Latest Ref Rng & Units 08/24/2019 08/23/2019 08/22/2019  WBC 4.0 - 10.5 K/uL 7.7 8.2 8.7  Hemoglobin 12.0 - 15.0 g/dL 15.5(H) 14.4 14.1  Hematocrit 36.0 - 46.0 % 51.1(H) 46.7(H) 46.0  Platelets 150 - 400 K/uL 163 157 154    CMP Latest Ref Rng & Units 08/24/2019 08/23/2019 08/22/2019  Glucose 70 - 99 mg/dL 126(H) 132(H) 184(H)  BUN 8 - 23 mg/dL 29(H) 32(H) 34(H)  Creatinine 0.44 - 1.00 mg/dL 1.79(H) 2.31(H) 2.69(H)  Sodium 135 - 145 mmol/L 139 140 139  Potassium 3.5 - 5.1 mmol/L 4.1 4.2 4.1  Chloride 98 - 111 mmol/L 95(L)  93(L) 93(L)  CO2 22 - 32 mmol/L 32 36(H) 35(H)  Calcium 8.9 - 10.3 mg/dL 8.0(L) 7.7(L) 7.6(L)  Total Protein 6.5 - 8.1 g/dL - - -  Total Bilirubin 0.3 - 1.2 mg/dL - - -  Alkaline Phos 38 - 126 U/L - - -  AST 15 - 41 U/L - - -  ALT 0 - 44 U/L - - -    Lab Results  Component Value Date   CALCIUM 8.0 (L) 08/24/2019   CAION 1.18 09/30/2013   PHOS 3.8 01/27/2012       Component Value Date/Time   COLORURINE AMBER (A) 08/18/2019 2150   APPEARANCEUR CLOUDY (A) 08/18/2019 2150   LABSPEC 1.017 08/18/2019 2150   PHURINE 5.0 08/18/2019 2150   GLUCOSEU NEGATIVE 08/18/2019 2150   HGBUR MODERATE (A) 08/18/2019 2150   BILIRUBINUR NEGATIVE 08/18/2019 2150   BILIRUBINUR 1+ 02/07/2019 1433   KETONESUR NEGATIVE 08/18/2019 2150   PROTEINUR >=300 (A) 08/18/2019 2150   UROBILINOGEN 0.2 02/07/2019 1433   UROBILINOGEN 0.2 07/07/2013 1353   NITRITE POSITIVE (A) 08/18/2019 2150  LEUKOCYTESUR LARGE (A) 08/18/2019 2150      Component Value Date/Time   PHART 7.420 07/30/2017 1338   PCO2ART 45.0 07/30/2017 1338   PO2ART 59.0 (L) 07/30/2017 1338   HCO3 33.0 (H) 07/30/2017 1339   TCO2 35 07/30/2017 1339   ACIDBASEDEF 0.8 12/20/2008 0130   O2SAT 85.6 08/24/2019 0356    No results found for: IRON, TIBC, FERRITIN, IRONPCTSAT     2D echocardiogram from 08/18/2019: Right ventricular with severely reduced systolic function.  Large pleural effusion in the left lateral region.  Severe concentric LVH.  Ejection fraction 30 to 35%.  ASSESSMENT/PLAN:    1.  Chronic kidney disease stage III.  Her baseline serum creatinine is 1.3-1.5.  CKD 2/2 HTN, DM, obesity, smoking  2.  Acute kidney injury.    Serum creatinine is improving to 1.79 today  3.  Hypertensive urgency.  Blood pressures elevated on admission, but currently stable.  4.  Diabetes mellitus.  Changes per primary team.  5.  Acute on chronic systolic and diastolic congestive heart failure.  Diuresis held yesterday d/t low CVPs.  If needing to add  back at some point, renal function would permit.  RV failure makes it difficult to diurese without Cr bump  6.  OSA--> she needs another sleep study.  She has what is likely an erythrocytosis which is likely from nocturnal hypoxia/ smoking.  7.  Excessive sleepiness- likely #6 is a big factor- will also decrease gabapentin to 200 mg TID and follow.

## 2019-08-24 NOTE — TOC Progression Note (Signed)
Transition of Care [facility]) - Progression Note    Patient Details  Name: [name] MRN: [id number] Date of Birth: [birth year]  Transition of Care [facility]) CM/SW Contact  Graves-Bigelow, [name], RN Phone Number: 08/24/2019, 4:55 PM  Clinical Narrative:   CM verified that patient will transition home with [name]- needs [facility] RN/PT orders and DME orders for [place]- MD aware. Patient states she uses [facility] on [address] and she is able to get medications without any issues. Patient states sister will provide transportation once stable to transition home. DME referral sent to [facility] and spoke with [name].  CM will continue to monitor for additional transition of care needs.    Expected Discharge Plan: [place] Barriers to Discharge: No Barriers Identified  Expected Discharge Plan and Services Expected Discharge Plan: [place] Choice: Durable Medical Equipment   Expected Discharge Date: 08/18/19               DME Arranged: Gilford Rile rolling with seat DME Agency: [facility] Date DME Agency Contacted: 08/24/19 Time DME Agency Contacted: [phone] Representative spoke with at DME Agency: [place]: PT, RN [name] Agency: [name] Date [place]: 08/23/19 Time [place]: 1608 Representative spoke with at [place]: [place] ([place]) Interventions    Readmission Risk Interventions No flowsheet data found.

## 2019-08-25 ENCOUNTER — Inpatient Hospital Stay (HOSPITAL_COMMUNITY): Payer: Medicare Other

## 2019-08-25 DIAGNOSIS — N183 Chronic kidney disease, stage 3 (moderate): Secondary | ICD-10-CM

## 2019-08-25 DIAGNOSIS — I509 Heart failure, unspecified: Secondary | ICD-10-CM

## 2019-08-25 DIAGNOSIS — E1122 Type 2 diabetes mellitus with diabetic chronic kidney disease: Secondary | ICD-10-CM

## 2019-08-25 DIAGNOSIS — Z794 Long term (current) use of insulin: Secondary | ICD-10-CM

## 2019-08-25 LAB — GLUCOSE, CAPILLARY
Glucose-Capillary: 113 mg/dL — ABNORMAL HIGH (ref 70–99)
Glucose-Capillary: 123 mg/dL — ABNORMAL HIGH (ref 70–99)
Glucose-Capillary: 118 mg/dL — ABNORMAL HIGH (ref 70–99)
Glucose-Capillary: 107 mg/dL — ABNORMAL HIGH (ref 70–99)

## 2019-08-25 LAB — BASIC METABOLIC PANEL
Anion gap: 9 (ref 5–15)
BUN: 27 mg/dL — ABNORMAL HIGH (ref 8–23)
CO2: 35 mmol/L — ABNORMAL HIGH (ref 22–32)
Calcium: 8.5 mg/dL — ABNORMAL LOW (ref 8.9–10.3)
Chloride: 96 mmol/L — ABNORMAL LOW (ref 98–111)
Creatinine, Ser: 1.78 mg/dL — ABNORMAL HIGH (ref 0.44–1.00)
GFR calc Af Amer: 35 mL/min — ABNORMAL LOW (ref >60)
GFR calc non Af Amer: 30 mL/min — ABNORMAL LOW (ref >60)
Glucose, Bld: 132 mg/dL — ABNORMAL HIGH (ref 70–99)
Potassium: 4.9 mmol/L (ref 3.5–5.1)
Sodium: 140 mmol/L (ref 135–145)

## 2019-08-25 LAB — CBC
HCT: 50.6 % — ABNORMAL HIGH (ref 36.0–46.0)
Hemoglobin: 15.5 g/dL — ABNORMAL HIGH (ref 12.0–15.0)
MCH: 29.1 pg (ref 26.0–34.0)
MCHC: 30.6 g/dL (ref 30.0–36.0)
MCV: 95.1 fL (ref 80.0–100.0)
Platelets: 182 10*3/uL (ref 150–400)
RBC: 5.32 MIL/uL — ABNORMAL HIGH (ref 3.87–5.11)
RDW: 16.1 % — ABNORMAL HIGH (ref 11.5–15.5)
WBC: 7.2 10*3/uL (ref 4.0–10.5)
nRBC: 0 % (ref 0.0–0.2)

## 2019-08-25 MED ORDER — FUROSEMIDE 10 MG/ML IJ SOLN: 80.0000 mg | Freq: Two times a day (BID) | INTRAMUSCULAR | Status: DC

## 2019-08-25 MED ORDER — METOLAZONE 2.5 MG PO TABS: 2.5000 mg | ORAL_TABLET | Freq: Once | ORAL | Status: DC

## 2019-08-25 MED ORDER — ISOSORBIDE MONONITRATE ER 30 MG PO TB24
30.0000 mg | ORAL_TABLET | Freq: Every day | ORAL | Status: DC
  Administered 2019-08-25 – 2019-08-26 (×2): 30 mg via ORAL
  Filled 2019-08-25 (×2): qty 1

## 2019-08-25 MED ORDER — TECHNETIUM TC 99M PYROPHOSPHATE
21.2000 | Freq: Once | INTRAVENOUS | Status: AC | PRN
  Administered 2019-08-25: 21.2 via INTRAVENOUS
  Filled 2019-08-25: qty 22

## 2019-08-25 MED ORDER — POTASSIUM CHLORIDE CRYS ER 20 MEQ PO TBCR: 20.0000 meq | EXTENDED_RELEASE_TABLET | Freq: Two times a day (BID) | ORAL | Status: DC

## 2019-08-25 MED ORDER — HYDRALAZINE HCL 25 MG PO TABS
12.5000 mg | ORAL_TABLET | Freq: Three times a day (TID) | ORAL | Status: DC
  Administered 2019-08-25 – 2019-08-26 (×3): 12.5 mg via ORAL
  Filled 2019-08-25 (×2): qty 1

## 2019-08-25 MED ORDER — TORSEMIDE 20 MG PO TABS
20.0000 mg | ORAL_TABLET | Freq: Every day | ORAL | Status: DC
  Administered 2019-08-25 – 2019-08-26 (×2): 20 mg via ORAL
  Filled 2019-08-25 (×2): qty 1

## 2019-08-25 NOTE — Progress Notes (Signed)
Advanced Heart Failure Rounding Note   Subjective:    Resting comfortably. No CP or SOB. Dizziness resolved. Creatinine remains close to baseline 1.7   BP up overnight  Weight up 1 pound.   Objective:   Weight Range:  Vital Signs:   Temp:  [97.6 F (36.4 C)-98 F (36.7 C)] 97.7 F (36.5 C) (09/17 0349) Pulse Rate:  [76-89] 85 (09/17 0349) Resp:  [15-22] 21 (09/17 0349) BP: (143-165)/(66-96) 154/66 (09/17 0349) SpO2:  [84 %-98 %] 93 % (09/17 0349) Weight:  [108.9 kg] 108.9 kg (09/17 0349) Last BM Date: 08/23/19  Weight change: Filed Weights   08/23/19 0500 08/24/19 0500 08/25/19 0349  Weight: 109 kg 108.5 kg 108.9 kg    Intake/Output:   Intake/Output Summary (Last 24 hours) at 08/25/2019 0648 Last data filed at 08/24/2019 1815 Gross per 24 hour  Intake 490 ml  Output -  Net 490 ml     Physical Exam: General:  Obese woman lying in bed . No resp difficulty HEENT: normal Neck: supple. no JVD. Carotids 2+ bilat; no bruits. No lymphadenopathy or thryomegaly appreciated. Cor: PMI nondisplaced. Regular rate & rhythm. No rubs, gallops or murmurs. Lungs: clear coarse  Abdomen: obese soft, nontender, nondistended. No hepatosplenomegaly. No bruits or masses. Good bowel sounds. Extremities: no cyanosis, clubbing, rash, edema Neuro: alert & orientedx3, cranial nerves grossly intact. moves all 4 extremities w/o difficulty. Affect pleasant    Telemetry:  NSR 80s Personally reviewed  Labs: Basic Metabolic Panel: Recent Labs  Lab 08/20/19 0633  08/21/19 2333 08/22/19 0415 08/23/19 0320 08/24/19 0435 08/25/19 0225  NA 142   < > 139 139 140 139 140  K 4.3   < > 3.9 4.1 4.2 4.1 4.9  CL 102   < > 97* 93* 93* 95* 96*  CO2 27   < > 30 35* 36* 32 35*  GLUCOSE 93   < > 156* 184* 132* 126* 132*  BUN 32*   < > 31* 34* 32* 29* 27*  CREATININE 2.73*   < > 2.57* 2.69* 2.31* 1.79* 1.78*  CALCIUM 8.1*   < > 7.1* 7.6* 7.7* 8.0* 8.5*  MG 1.9  --   --  1.7  --  2.2  --    <  > = values in this interval not displayed.    Liver Function Tests: No results for input(s): AST, ALT, ALKPHOS, BILITOT, PROT, ALBUMIN in the last 168 hours. No results for input(s): LIPASE, AMYLASE in the last 168 hours. No results for input(s): AMMONIA in the last 168 hours.  CBC: Recent Labs  Lab 08/21/19 0527 08/22/19 0415 08/23/19 0320 08/24/19 0435 08/25/19 0225  WBC 10.1 8.7 8.2 7.7 7.2  HGB 15.3* 14.1 14.4 15.5* 15.5*  HCT 49.8* 46.0 46.7* 51.1* 50.6*  MCV 95.4 95.6 94.7 94.8 95.1  PLT 173 154 157 163 182    Cardiac Enzymes: No results for input(s): CKTOTAL, CKMB, CKMBINDEX, TROPONINI in the last 168 hours.  BNP: BNP (last 3 results) Recent Labs    08/17/19 0457  BNP 1,427.6*    ProBNP (last 3 results) Recent Labs    05/23/19 1201  PROBNP 1,442.0*      Other results:  Imaging: No results found.   Medications:     Scheduled Medications: . allopurinol  100 mg Oral Daily  . aspirin EC  81 mg Oral Daily  . busPIRone  15 mg Oral BID  . Chlorhexidine Gluconate Cloth  6 each Topical Daily  .  enoxaparin (LOVENOX) injection  0.5 mg/kg Subcutaneous Daily  . FLUoxetine  20 mg Oral QHS  . insulin aspart  0-9 Units Subcutaneous TID WC  . insulin glargine  60 Units Subcutaneous Daily  . mouth rinse  15 mL Mouth Rinse BID  . nicotine  21 mg Transdermal Daily  . pantoprazole  40 mg Oral Daily  . polyethylene glycol  17 g Oral BID  . potassium chloride SA  20 mEq Oral Daily  . pregabalin  75 mg Oral BID  . rosuvastatin  20 mg Oral Daily  . sodium chloride flush  3 mL Intravenous Q12H  . topiramate  25 mg Oral BID    Infusions:   PRN Medications: acetaminophen **OR** acetaminophen, albuterol, hydrALAZINE, nitroGLYCERIN, ondansetron **OR** ondansetron (ZOFRAN) IV   Assessment/Plan:   1. Cardiogenic shock  - initial swan numbers confirmed biventricular failure with low CI @ 1.6 Resolved.  - swan out. No co-ox today but appears well perfused   2. Acute biventricular HF  - echo EF 25-30%. RV severely HK  - EF newly down. Troponin normal. Clearly not ACS.  - Cath 2018 with minimal CAD - Will not repeat coronary angio currently with AKI - initial swan numbers confirmed biventricular failure with low CI @ 1.6. much improved with milrinone and diuresis.  - BP up overnight again.  Add 12.5 mg hydralazine three times a day + 15 mg imdur. As renal function improves consdier switch to ARB.  - No spiro/dig/Arb with elevated creatinine.   - Need to consider amyloid. Myeloma panel- no M spike noted .  - PYP today - Start torsemide 20mg  daily  3. Acute on chronic hypoxic respiratory failure - multifactorial  - continue O2.  Continue to wean oxygen. Wean oxygen as needed.   4. PAH with /cor pulmonale - likely who GROUP II & III - Has sleep apnea but has not used CPAP.  -Will need repeat sleep study.   5. AKI - baseline creatinine 1.4-1.7 - Creatinine peaked at 2.73.  - Creatinine stable 1.7 - DM and HTN also likely contributing - avoid nephrotoxic meds  6. COPD with ongoing tobacco use - discussed need to quit  7. Morbid obesity - need weight loss  8. OSA - needs CPAP.  Repeat sleep study.   9. DM2 - continue SSI - consider SGLT2i at some point - renal function permitting  10. Deconditioning PT consulted with recommendations for HHPT.   11. HTN - BP up today  Making progress but med regimen still not stable. Will get PYP today. Consolidate meds. Ambulate. If stable -> home tomorrow fromour standpoint.   Length of Stay: 9   Glori Bickers MD 08/25/2019, 6:48 AM  Advanced Heart Failure Team Pager [phone] (M-F; [place])  Please contact [facility] for night-coverage after hours (4p -7a ) and weekends on [email or web address]

## 2019-08-25 NOTE — Progress Notes (Signed)
[facility]    NEPHROLOGY PROGRESS NOTE  SUBJECTIVE: Cr stable.  On oral diuretics now.    OBJECTIVE:  Vitals:   08/25/19 0349 08/25/19 0741  BP: (!) 154/66 123/85  Pulse: 85 87  Resp: (!) 21 16  Temp: 97.7 F (36.5 C) (!) 97.4 F (36.3 C)  SpO2: 93% 92%    Intake/Output Summary (Last 24 hours) at 08/25/2019 0836 Last data filed at 08/24/2019 1815 Gross per 24 hour  Intake 490 ml  Output -  Net 490 ml      General:  Sleeping, NAD NECK thick neck CV:  Heart RRR  Lungs:  Clear bilaterally with no crackles at bases Abd:  obese Extremities: trace LE edema Skin:  No skin rash  MEDICATIONS:  . allopurinol  100 mg Oral Daily  . aspirin EC  81 mg Oral Daily  . busPIRone  15 mg Oral BID  . Chlorhexidine Gluconate Cloth  6 each Topical Daily  . enoxaparin (LOVENOX) injection  0.5 mg/kg Subcutaneous Daily  . FLUoxetine  20 mg Oral QHS  . hydrALAZINE  12.5 mg Oral Q8H  . insulin aspart  0-9 Units Subcutaneous TID WC  . insulin glargine  60 Units Subcutaneous Daily  . isosorbide mononitrate  30 mg Oral Daily  . mouth rinse  15 mL Mouth Rinse BID  . nicotine  21 mg Transdermal Daily  . pantoprazole  40 mg Oral Daily  . polyethylene glycol  17 g Oral BID  . potassium chloride SA  20 mEq Oral Daily  . pregabalin  75 mg Oral BID  . rosuvastatin  20 mg Oral Daily  . sodium chloride flush  3 mL Intravenous Q12H  . topiramate  25 mg Oral BID  . torsemide  20 mg Oral Daily       LABS:   CBC Latest Ref Rng & Units 08/25/2019 08/24/2019 08/23/2019  WBC 4.0 - 10.5 K/uL 7.2 7.7 8.2  Hemoglobin 12.0 - 15.0 g/dL 15.5(H) 15.5(H) 14.4  Hematocrit 36.0 - 46.0 % 50.6(H) 51.1(H) 46.7(H)  Platelets 150 - 400 K/uL 182 163 157    CMP Latest Ref Rng & Units 08/25/2019 08/24/2019 08/23/2019  Glucose 70 - 99 mg/dL 132(H) 126(H) 132(H)  BUN 8 - 23 mg/dL 27(H) 29(H) 32(H)  Creatinine 0.44 - 1.00 mg/dL 1.78(H) 1.79(H) 2.31(H)  Sodium 135 - 145 mmol/L 140 139 140  Potassium  3.5 - 5.1 mmol/L 4.9 4.1 4.2  Chloride 98 - 111 mmol/L 96(L) 95(L) 93(L)  CO2 22 - 32 mmol/L 35(H) 32 36(H)  Calcium 8.9 - 10.3 mg/dL 8.5(L) 8.0(L) 7.7(L)  Total Protein 6.5 - 8.1 g/dL - - -  Total Bilirubin 0.3 - 1.2 mg/dL - - -  Alkaline Phos 38 - 126 U/L - - -  AST 15 - 41 U/L - - -  ALT 0 - 44 U/L - - -    Lab Results  Component Value Date   CALCIUM 8.5 (L) 08/25/2019   CAION 1.18 09/30/2013   PHOS 3.8 01/27/2012       Component Value Date/Time   COLORURINE AMBER (A) 08/18/2019 2150   APPEARANCEUR CLOUDY (A) 08/18/2019 2150   LABSPEC 1.017 08/18/2019 2150   PHURINE 5.0 08/18/2019 2150   GLUCOSEU NEGATIVE 08/18/2019 2150   HGBUR MODERATE (A) 08/18/2019 2150   BILIRUBINUR NEGATIVE 08/18/2019 2150   BILIRUBINUR 1+ 02/07/2019 1433   KETONESUR NEGATIVE 08/18/2019 2150   PROTEINUR >=300 (A) 08/18/2019 2150   UROBILINOGEN 0.2 02/07/2019 1433  UROBILINOGEN 0.2 07/07/2013 1353   NITRITE POSITIVE (A) 08/18/2019 2150   LEUKOCYTESUR LARGE (A) 08/18/2019 2150      Component Value Date/Time   PHART 7.420 07/30/2017 1338   PCO2ART 45.0 07/30/2017 1338   PO2ART 59.0 (L) 07/30/2017 1338   HCO3 33.0 (H) 07/30/2017 1339   TCO2 35 07/30/2017 1339   ACIDBASEDEF 0.8 12/20/2008 0130   O2SAT 85.6 08/24/2019 0356    No results found for: IRON, TIBC, FERRITIN, IRONPCTSAT     2D echocardiogram from 08/18/2019: Right ventricular with severely reduced systolic function.  Large pleural effusion in the left lateral region.  Severe concentric LVH.  Ejection fraction 30 to 35%.  ASSESSMENT/PLAN:    1.  Chronic kidney disease stage III.  Her baseline serum creatinine is 1.3-1.5.  CKD 2/2 HTN, DM, obesity, smoking  2.  Acute kidney injury.    Serum creatinine is improving to 1.7-1.8 and is stable.    3.  Hypertensive urgency.  Blood pressures elevated on admission, a little worse yesterday, hydralazine added today.  4.  Diabetes mellitus.  Changes per primary team.  5.  Acute on chronic  systolic and diastolic congestive heart failure. On torsemide 20 mg daily, started today.  6.  OSA--> she needs another sleep study.  She has what is likely an erythrocytosis which is likely from nocturnal hypoxia/ smoking.  7.  Excessive sleepiness- likely #6 is a big factor- will also decrease gabapentin to 200 mg TID and follow.  8.  Dispo: We will sign off now.  She is my clinic pt so I will arrange f/u with me in the office.

## 2019-08-25 NOTE — Progress Notes (Signed)
PROGRESS NOTE                                                                                                                                                                                                             Patient Demographics:    [name], is a 63 y.o. female, DOB - 01/22/1956, YNX:[id number]  Admit date - 08/16/2019   Admitting Physician Rise Patience, MD  Outpatient Primary MD for the patient is Luetta Nutting, DO  LOS - 9   Chief Complaint  Patient presents with  . Shortness of Breath  . Chest Pain       Brief Narrative    63 y.o. female with history of chronic diastolic CHF, hypertension, chronic kidney disease, pulmonary hypertension diabetes mellitus, CVA, CSF rhinorrhea status post reconstruction surgery sleep apnea and ongoing tobacco abuse presents to the ER because of increasing shortness of breath over the last 3 weeks, work-up significant for volume overload, admitted for IV diuresis.  Patient was on IV Lasix initially, but this has been held as her creatinine kept trending up despite holding her IV diuresis, cardiology were consulted to assist with diuresis, and renal consulted to assist with worsening renal function.  she was transferred to CCU 9/12, for initiation of milrinone drip, and close monitoring with Swan catheter.  She is with improvement of volume status, renal function, transferred out of the unit 08/24/2019   Subjective:    [name] today denies any chest pain, fever, chills, nausea or vomiting.  No further dizziness,   Assessment  & Plan :    Principal Problem:   Acute diastolic CHF (congestive heart failure) (HCC) Active Problems:   Essential hypertension   Morbid obesity due to excess calories (HCC)   HLD (hyperlipidemia)   OSA (obstructive sleep apnea)   COPD  GOLD 0 still smoking   Pulmonary hypertension, unspecified (HCC)   Type 2 diabetes mellitus with stage 3 chronic kidney  disease, with long-term current use of insulin (HCC)  Acute on chronic combined systolic/diastolic CHF, and biventricular failure/cardiogenic shock. -Most recent echo and 2018 showing EF was 60 to 65% with grade 2 diastolic dysfunction - repeat echo this admission showing EF 30-35% w/ diffuse hypokinesis, severe concentric LVH and severely educed RV systolic function.  -Initial Swan cath numbers confirmed biventricular failure with low CI at  1.6, this has resolved -CHF team input greatly appreciated, patient required admission to ICU, with [facility] cath monitoring, on milrinone drip, Lasix drip, volume management per CHF team, so far 9.6 L during hospital stay. -She did require some IV fluids over last 24 hours to improve her lightheadedness and dehydration, diuresis per CHF team, currently she is on p.o. torsemide. - Amyloidosis ?  multiple myeloma panel with no M spike, patient for PYP today.  Pulmonary artery hypertension/cor pulmonale -We will need repeat sleep study, will request on discharge summary for PCP to send for sleep study as an outpatient, has sleep apnea but has not been using CPAP in the past.  AKI on Chronic kidney disease stage III  -Baseline creatinine around 1.5, peaked at 2.2, improved with milrinone drip, most recent is 1.7 . -Renal input greatly appreciated  Hypertension -Meds management per cardiology, started on low-dose hydralazine, and Imdur.  Diabetes mellitus type 2 -Patient on significant Lantus dose at home 120 units subcu daily, she has been hypoglycemic, her CBG currently controlled on 60 units twice daily and sliding scale .  Sleep apnea  -We will need repeat sleep study as an outpatient  UTI -Received Total of 3 days of Rocephin  COPD  - not actively wheezing continue inhalers.   Tobacco abuse  - advised about quitting.  Hyperlipidemia on statins.  History of CVA  - on statins and antiplatelet agents.  History of gout  - on allopurinol.   History of depression and anxiety  - on Prozac and BuSpar.  Reports she is forgetful, with excessive sleepiness, will change gabapentin to Lyrica   COVID-19 Labs  No results for input(s): DDIMER, FERRITIN, LDH, CRP in the last 72 hours.  Lab Results  Component Value Date   [place] NEGATIVE 08/17/2019     Code Status : Full code  Family Communication  : Discussed with sister via phone 9/16,  Disposition Plan  : Home once stable  Consults  : cardiology, Nephrology.  Advanced CHF team  Procedures  : Swan catheter 9/12>9/15  DVT Prophylaxis  : Subcu Lovenox  Lab Results  Component Value Date   PLT 182 08/25/2019    Antibiotics  :    Anti-infectives (From admission, onward)   Start     Dose/Rate Route Frequency Ordered Stop   08/19/19 0815  cefTRIAXone (ROCEPHIN) 1 g in sodium chloride 0.9 % 100 mL IVPB  Status:  Discontinued     1 g 200 mL/hr over 30 Minutes Intravenous Every 24 hours 08/19/19 0814 08/21/19 1256        Objective:   Vitals:   08/24/19 2334 08/25/19 0349 08/25/19 0741 08/25/19 1110  BP: (!) 150/74 (!) 154/66 123/85 (!) 146/76  Pulse:  85 87 81  Resp:  (!) 21 16 18   Temp: 98 F (36.7 C) 97.7 F (36.5 C) (!) 97.4 F (36.3 C) 97.9 F (36.6 C)  TempSrc: Oral Oral Oral Oral  SpO2: 90% 93% 92% 92%  Weight:  108.9 kg    Height:        Wt Readings from Last 3 Encounters:  08/25/19 108.9 kg  08/16/19 116.6 kg  07/04/19 113.3 kg     Intake/Output Summary (Last 24 hours) at 08/25/2019 1117 Last data filed at 08/24/2019 1815 Gross per 24 hour  Intake 490 ml  Output -  Net 490 ml     Physical Exam  Awake Alert, Oriented X 3, No new F.N deficits, Normal affect Symmetrical Chest wall movement, Good  air movement bilaterally, CTAB RRR,No Gallops,Rubs or new Murmurs, No Parasternal Heave +ve B.Sounds, Abd Soft, No tenderness, No rebound - guarding or rigidity. No Cyanosis, Clubbing, trace edema, No new Rash or bruise       Data  Review:    CBC Recent Labs  Lab 08/21/19 0527 08/22/19 0415 08/23/19 0320 08/24/19 0435 08/25/19 0225  WBC 10.1 8.7 8.2 7.7 7.2  HGB 15.3* 14.1 14.4 15.5* 15.5*  HCT 49.8* 46.0 46.7* 51.1* 50.6*  PLT 173 154 157 163 182  MCV 95.4 95.6 94.7 94.8 95.1  MCH 29.3 29.3 29.2 28.8 29.1  MCHC 30.7 30.7 30.8 30.3 30.6  RDW 16.5* 15.9* 15.8* 15.9* 16.1*    Chemistries  Recent Labs  Lab 08/20/19 0633  08/21/19 2333 08/22/19 0415 08/23/19 0320 08/24/19 0435 08/25/19 0225  NA 142   < > 139 139 140 139 140  K 4.3   < > 3.9 4.1 4.2 4.1 4.9  CL 102   < > 97* 93* 93* 95* 96*  CO2 27   < > 30 35* 36* 32 35*  GLUCOSE 93   < > 156* 184* 132* 126* 132*  BUN 32*   < > 31* 34* 32* 29* 27*  CREATININE 2.73*   < > 2.57* 2.69* 2.31* 1.79* 1.78*  CALCIUM 8.1*   < > 7.1* 7.6* 7.7* 8.0* 8.5*  MG 1.9  --   --  1.7  --  2.2  --    < > = values in this interval not displayed.   ------------------------------------------------------------------------------------------------------------------ No results for input(s): CHOL, HDL, LDLCALC, TRIG, CHOLHDL, LDLDIRECT in the last 72 hours.  Lab Results  Component Value Date   HGBA1C 8.2 (H) 05/23/2019   ------------------------------------------------------------------------------------------------------------------ No results for input(s): TSH, T4TOTAL, T3FREE, THYROIDAB in the last 72 hours.  Invalid input(s): FREET3 ------------------------------------------------------------------------------------------------------------------ No results for input(s): VITAMINB12, FOLATE, FERRITIN, TIBC, IRON, RETICCTPCT in the last 72 hours.  Coagulation profile No results for input(s): INR, PROTIME in the last 168 hours.  No results for input(s): DDIMER in the last 72 hours.  Cardiac Enzymes No results for input(s): CKMB, TROPONINI, MYOGLOBIN in the last 168 hours.  Invalid input(s): CK  ------------------------------------------------------------------------------------------------------------------    Component Value Date/Time   BNP 1,427.6 (H) 08/17/2019 0457   BNP 43.7 11/07/2011 1436    Inpatient Medications  Scheduled Meds: . allopurinol  100 mg Oral Daily  . aspirin EC  81 mg Oral Daily  . busPIRone  15 mg Oral BID  . Chlorhexidine Gluconate Cloth  6 each Topical Daily  . enoxaparin (LOVENOX) injection  0.5 mg/kg Subcutaneous Daily  . FLUoxetine  20 mg Oral QHS  . hydrALAZINE  12.5 mg Oral Q8H  . insulin aspart  0-9 Units Subcutaneous TID WC  . insulin glargine  60 Units Subcutaneous Daily  . isosorbide mononitrate  30 mg Oral Daily  . mouth rinse  15 mL Mouth Rinse BID  . nicotine  21 mg Transdermal Daily  . pantoprazole  40 mg Oral Daily  . polyethylene glycol  17 g Oral BID  . potassium chloride SA  20 mEq Oral Daily  . pregabalin  75 mg Oral BID  . rosuvastatin  20 mg Oral Daily  . sodium chloride flush  3 mL Intravenous Q12H  . topiramate  25 mg Oral BID  . torsemide  20 mg Oral Daily   Continuous Infusions:  PRN Meds:.acetaminophen **OR** acetaminophen, albuterol, hydrALAZINE, nitroGLYCERIN, ondansetron **OR** ondansetron (ZOFRAN) IV  Micro Results Recent Results (  from the past 240 hour(s))  SARS Coronavirus 2 [facility] order, Performed in [facility] hospital lab) Nasopharyngeal Nasopharyngeal Swab     Status: None   Collection Time: 08/17/19 12:38 AM   Specimen: Nasopharyngeal Swab  Result Value Ref Range Status   SARS Coronavirus 2 NEGATIVE NEGATIVE Final    Comment: (NOTE) If result is NEGATIVE SARS-CoV-2 target nucleic acids are NOT DETECTED. The SARS-CoV-2 RNA is generally detectable in upper and lower  respiratory specimens during the acute phase of infection. The lowest  concentration of SARS-CoV-2 viral copies this assay can detect is 250  copies / mL. A negative result does not preclude SARS-CoV-2 infection  and should not be  used as the sole basis for treatment or other  patient management decisions.  A negative result may occur with  improper specimen collection / handling, submission of specimen other  than nasopharyngeal swab, presence of viral mutation(s) within the  areas targeted by this assay, and inadequate number of viral copies  (<250 copies / mL). A negative result must be combined with clinical  observations, patient history, and epidemiological information. If result is POSITIVE SARS-CoV-2 target nucleic acids are DETECTED. The SARS-CoV-2 RNA is generally detectable in upper and lower  respiratory specimens dur ing the acute phase of infection.  Positive  results are indicative of active infection with SARS-CoV-2.  Clinical  correlation with patient history and other diagnostic information is  necessary to determine patient infection status.  Positive results do  not rule out bacterial infection or co-infection with other viruses. If result is PRESUMPTIVE POSTIVE SARS-CoV-2 nucleic acids MAY BE PRESENT.   A presumptive positive result was obtained on the submitted specimen  and confirmed on repeat testing.  While 2019 novel coronavirus  (SARS-CoV-2) nucleic acids may be present in the submitted sample  additional confirmatory testing may be necessary for epidemiological  and / or clinical management purposes  to differentiate between  SARS-CoV-2 and other Sarbecovirus currently known to infect humans.  If clinically indicated additional testing with an alternate test  methodology [phone]) is advised. The SARS-CoV-2 RNA is generally  detectable in upper and lower respiratory sp ecimens during the acute  phase of infection. The expected result is Negative. Fact Sheet for Patients:  StrictlyIdeas.no Fact Sheet for Healthcare Providers: BankingDealers.co.za This test is not yet approved or cleared by the [place] FDA and has been authorized  for detection and/or diagnosis of SARS-CoV-2 by FDA under an Emergency Use Authorization (EUA).  This EUA will remain in effect (meaning this test can be used) for the duration of the COVID-19 declaration under Section 564(b)(1) of the Act, 21 U.S.C. section 360bbb-3(b)(1), unless the authorization is terminated or revoked sooner. Performed at [facility], [place] [address] [place], [place] [address]   Culture, Urine     Status: Abnormal   Collection Time: 08/19/19  3:51 PM   Specimen: Urine, Clean Catch  Result Value Ref Range Status   Specimen Description URINE, CLEAN CATCH  Final   Special Requests   Final    NONE Performed at [facility], [place] [address] [place], [place] [address]    Culture MULTIPLE SPECIES PRESENT, SUGGEST RECOLLECTION (A)  Final   Report Status 08/20/2019 FINAL  Final  MRSA PCR Screening     Status: None   Collection Time: 08/22/19  1:33 PM   Specimen: Nasal Mucosa; Nasopharyngeal  Result Value Ref Range Status   MRSA by PCR NEGATIVE NEGATIVE Final  Comment:        The GeneXpert MRSA Assay (FDA approved for NASAL specimens only), is one component of a comprehensive MRSA colonization surveillance program. It is not intended to diagnose MRSA infection nor to guide or monitor treatment for MRSA infections. Performed at [facility], [place] [address], [place], [place] [address]     Radiology Reports Dg Chest 2 View  Result Date: 08/16/2019 CLINICAL DATA:  63 year old female with chest pain and shortness of breath. EXAM: CHEST - 2 VIEW COMPARISON:  Chest radiograph dated 01/05/2019 FINDINGS: Small left pleural effusion and left lung base atelectasis versus infiltrate. Overall slight interval increase in the size of the left pleural effusion and associated left lung base opacity compared to the prior radiograph. The right lung remains clear. No pneumothorax. Stable cardiac silhouette. No acute osseous pathology. Degenerative changes of the  spine. IMPRESSION: Small left pleural effusion and left lung base atelectasis versus infiltrate, increased in size since the prior radiograph. Electronically Signed   By: Anner Crete M.D.   On: 08/16/2019 19:01   Dg Chest Port 1 View  Result Date: 08/20/2019 CLINICAL DATA:  Central line placement EXAM: PORTABLE CHEST 1 VIEW COMPARISON:  08/16/2019, 01/05/2019 FINDINGS: Insertion of right IJ Swan-Ganz catheter, tip projects over right lower lobe pulmonary artery. Negative for right pneumothorax. Suspected left pleural effusion and left basilar airspace disease. Possible small right effusion. Aortic atherosclerosis. Cardiomegaly. IMPRESSION: 1. Right IJ Swan-Ganz catheter tip projects over right lower lobe pulmonary artery and may be withdrawn for more optimal positioning. Negative for right pneumothorax 2. Cardiomegaly with left pleural effusion and left basilar airspace disease. Probable small right pleural effusion. Electronically Signed   By: Donavan Foil M.D.   On: 08/20/2019 15:59     Phillips Climes M.D on 08/25/2019 at 11:17 AM  Between 7am to 7pm - Pager - [phone]  After 7pm go to [email or web address] - password [facility]  [facility] -  Office  [phone]

## 2019-08-25 NOTE — Plan of Care (Signed)
Progressing

## 2019-08-25 NOTE — Care Management Important Message (Signed)
Important Message  Patient Details  Name: [name] MRN: [id number] Date of Birth: 1956-08-18   Medicare Important Message Given:  Yes     Shelda Altes 08/25/2019, 4:00 PM

## 2019-08-26 ENCOUNTER — Other Ambulatory Visit: Payer: Self-pay | Admitting: Family Medicine

## 2019-08-26 LAB — BASIC METABOLIC PANEL
Anion gap: 9 (ref 5–15)
BUN: 27 mg/dL — ABNORMAL HIGH (ref 8–23)
CO2: 32 mmol/L (ref 22–32)
Calcium: 8.7 mg/dL — ABNORMAL LOW (ref 8.9–10.3)
Chloride: 98 mmol/L (ref 98–111)
Creatinine, Ser: 1.79 mg/dL — ABNORMAL HIGH (ref 0.44–1.00)
GFR calc Af Amer: 35 mL/min — ABNORMAL LOW (ref >60)
GFR calc non Af Amer: 30 mL/min — ABNORMAL LOW (ref >60)
Glucose, Bld: 127 mg/dL — ABNORMAL HIGH (ref 70–99)
Potassium: 4.5 mmol/L (ref 3.5–5.1)
Sodium: 139 mmol/L (ref 135–145)

## 2019-08-26 LAB — GLUCOSE, CAPILLARY
Glucose-Capillary: 96 mg/dL (ref 70–99)
Glucose-Capillary: 97 mg/dL (ref 70–99)

## 2019-08-26 LAB — CBC
HCT: 50 % — ABNORMAL HIGH (ref 36.0–46.0)
Hemoglobin: 15.5 g/dL — ABNORMAL HIGH (ref 12.0–15.0)
MCH: 29.1 pg (ref 26.0–34.0)
MCHC: 31 g/dL (ref 30.0–36.0)
MCV: 93.8 fL (ref 80.0–100.0)
Platelets: 174 10*3/uL (ref 150–400)
RBC: 5.33 MIL/uL — ABNORMAL HIGH (ref 3.87–5.11)
RDW: 16.1 % — ABNORMAL HIGH (ref 11.5–15.5)
WBC: 6.6 10*3/uL (ref 4.0–10.5)
nRBC: 0 % (ref 0.0–0.2)

## 2019-08-26 MED ORDER — ISOSORBIDE MONONITRATE ER 30 MG PO TB24: 30.0000 mg | ORAL_TABLET | Freq: Every day | ORAL | 0 refills | Status: DC

## 2019-08-26 MED ORDER — PREGABALIN 75 MG PO CAPS: 75.0000 mg | ORAL_CAPSULE | Freq: Two times a day (BID) | ORAL | 0 refills | Status: DC

## 2019-08-26 MED ORDER — INSULIN GLARGINE 300 UNIT/ML ~~LOC~~ SOPN: 60.0000 [IU] | PEN_INJECTOR | Freq: Every day | SUBCUTANEOUS | Status: DC

## 2019-08-26 MED ORDER — LOSARTAN POTASSIUM 25 MG PO TABS: 12.5000 mg | ORAL_TABLET | Freq: Every day | ORAL | 0 refills | Status: DC

## 2019-08-26 MED ORDER — HYDRALAZINE HCL 25 MG PO TABS: 25.0000 mg | ORAL_TABLET | Freq: Three times a day (TID) | ORAL | 0 refills | Status: DC

## 2019-08-26 MED ORDER — GABAPENTIN 400 MG PO CAPS: 400.0000 mg | ORAL_CAPSULE | Freq: Every day | ORAL | Status: DC

## 2019-08-26 MED ORDER — NOVOLOG FLEXPEN 100 UNIT/ML ~~LOC~~ SOPN: 5.0000 [IU] | PEN_INJECTOR | Freq: Three times a day (TID) | SUBCUTANEOUS | Status: DC

## 2019-08-26 MED ORDER — LOSARTAN POTASSIUM 25 MG PO TABS
12.5000 mg | ORAL_TABLET | Freq: Every day | ORAL | Status: DC
  Administered 2019-08-26: 12.5 mg via ORAL
  Filled 2019-08-26: qty 1

## 2019-08-26 MED ORDER — INSULIN GLARGINE 300 UNIT/ML ~~LOC~~ SOPN: 120.0000 [IU] | PEN_INJECTOR | Freq: Every day | SUBCUTANEOUS | Status: DC

## 2019-08-26 MED ORDER — TORSEMIDE 20 MG PO TABS: 20.0000 mg | ORAL_TABLET | Freq: Every day | ORAL | 0 refills | Status: DC

## 2019-08-26 MED ORDER — HYDRALAZINE HCL 25 MG PO TABS: 25.0000 mg | ORAL_TABLET | Freq: Three times a day (TID) | ORAL | Status: DC

## 2019-08-26 MED FILL — ISOSORBIDE MN ER 30 MG TAB: 30 | 30 days supply | Qty: 30 | Fill #0

## 2019-08-26 MED FILL — hydrALAZINE HCL 25 MG TABS: 25 | 30 days supply | Qty: 90 | Fill #0

## 2019-08-26 MED FILL — LOSARTAN POTASSIUM 25 MG TA: 25 | 30 days supply | Qty: 15 | Fill #0

## 2019-08-26 MED FILL — TORSEMIDE 20 MG TABLET: 20 | 30 days supply | Qty: 30 | Fill #0

## 2019-08-26 NOTE — Progress Notes (Signed)
SATURATION QUALIFICATIONS: (This note is used to comply with regulatory documentation for home oxygen)  Patient Saturations on Room Air at Rest =94%  Patient Saturations on Room Air while Ambulating =91%  Patient Saturations on 0 Liters of oxygen while Ambulating = 91%  Please briefly explain why patient needs home oxygen:  Pt ambulated ~363ft on room air with a walker, maintained 91%. Denied SOB. Will not require home o2

## 2019-08-26 NOTE — TOC Transition Note (Addendum)
Transition of Care [facility]) - CM/SW Discharge Note   Patient Details  Name: LAYNEY GILLSON MRN: [id number] Date of Birth: [birth year]  [facility]) CM/SW Contact:  Zenon Mayo, RN Phone Number: 08/26/2019, 9:16 AM   Clinical Narrative:    Patient for dc today, she will need home oxygen, NCM contacted staff RN to put sats on chart and need oxygen order for patient in system.  Patient states she would like to work with [employer], referral made to [facility].  Patient states she is going to address of [address] [place] [address] and then the weekend she will be going to her address.    9/18 11:03 per ambulatory sats, patient sats does not qualify for oxygen.    Final next level of care: [place] Barriers to Discharge: No Barriers Identified   Patient Goals and CMS Choice Patient states their goals for this hospitalization and ongoing recovery are:: go home CMS Medicare.gov Compare Post Acute Care list provided to:: Patient Choice offered to / list presented to : Patient  Discharge Placement                       Discharge Plan and Services     Post Acute Care Choice: Durable Medical Equipment          DME Arranged: Walker rolling with seat, Oxygen DME Agency: [employer] Date DME Agency Contacted: 08/24/19 Time DME Agency Contacted: 0900 Representative spoke with at DME Agency: zack HH Arranged: RN, PT [place] Agency: [place] Date [place]: 08/24/19 Time HH Agency Contacted: 0900 Representative spoke with at [place]: cory  Social Determinants of Health ([place]) Interventions     Readmission Risk Interventions No flowsheet data found.

## 2019-08-26 NOTE — Progress Notes (Addendum)
Advanced Heart Failure Rounding Note   Subjective:   Weight down 4 pounds.   Feeling much better. Wants to go home. Walked with staff and maintained oxygen saturations  > 90 %.   Denies SOB.    Objective:   Weight Range:  Vital Signs:   Temp:  [98 F (36.7 C)-98.3 F (36.8 C)] 98.3 F (36.8 C) (09/18 1007) Pulse Rate:  [74-92] 74 (09/18 1007) Resp:  [18-20] 20 (09/18 1007) BP: (147-172)/(69-84) 154/73 (09/18 1007) SpO2:  [91 %-97 %] 92 % (09/18 1007) Weight:  [107.1 kg] 107.1 kg (09/18 0625) Last BM Date: 08/25/19  Weight change: Filed Weights   08/24/19 0500 08/25/19 0349 08/26/19 0625  Weight: 108.5 kg 108.9 kg 107.1 kg    Intake/Output:  No intake or output data in the 24 hours ending 08/26/19 1151   Physical Exam: General:  Well appearing. No resp difficulty HEENT: normal  Neck: supple. no JVD. Carotids 2+ bilat; no bruits. No lymphadenopathy or thryomegaly appreciated. Ecchymosis neck and small indurated area RIJ from swan.  Cor: PMI nondisplaced. Regular rate & rhythm. No rubs, gallops or murmurs. Lungs: clear on room air.   Abdomen: soft, nontender, nondistended. No hepatosplenomegaly. No bruits or masses. Good bowel sounds. Extremities: no cyanosis, clubbing, rash, edema Neuro: alert & orientedx3, cranial nerves grossly intact. moves all 4 extremities w/o difficulty. Affect pleasant  Telemetry:  SR 80-90s personally reviewed.   Labs: Basic Metabolic Panel: Recent Labs  Lab 08/20/19 0633  08/22/19 0415 08/23/19 0320 08/24/19 0435 08/25/19 0225 08/26/19 0146  NA 142   < > 139 140 139 140 139  K 4.3   < > 4.1 4.2 4.1 4.9 4.5  CL 102   < > 93* 93* 95* 96* 98  CO2 27   < > 35* 36* 32 35* 32  GLUCOSE 93   < > 184* 132* 126* 132* 127*  BUN 32*   < > 34* 32* 29* 27* 27*  CREATININE 2.73*   < > 2.69* 2.31* 1.79* 1.78* 1.79*  CALCIUM 8.1*   < > 7.6* 7.7* 8.0* 8.5* 8.7*  MG 1.9  --  1.7  --  2.2  --   --    < > = values in this interval not  displayed.    Liver Function Tests: No results for input(s): AST, ALT, ALKPHOS, BILITOT, PROT, ALBUMIN in the last 168 hours. No results for input(s): LIPASE, AMYLASE in the last 168 hours. No results for input(s): AMMONIA in the last 168 hours.  CBC: Recent Labs  Lab 08/22/19 0415 08/23/19 0320 08/24/19 0435 08/25/19 0225 08/26/19 0146  WBC 8.7 8.2 7.7 7.2 6.6  HGB 14.1 14.4 15.5* 15.5* 15.5*  HCT 46.0 46.7* 51.1* 50.6* 50.0*  MCV 95.6 94.7 94.8 95.1 93.8  PLT 154 157 163 182 174    Cardiac Enzymes: No results for input(s): CKTOTAL, CKMB, CKMBINDEX, TROPONINI in the last 168 hours.  BNP: BNP (last 3 results) Recent Labs    08/17/19 0457  BNP 1,427.6*    ProBNP (last 3 results) Recent Labs    05/23/19 1201  PROBNP 1,442.0*      Other results:  Imaging: Nm Cardiac Amyloid Tumor Loc Inflam Spect 1 Day  Result Date: 08/26/2019 CLINICAL DATA:  HEART FAILURE. CONCERN FOR CARDIAC AMYLOIDOSIS. EXAM: NUCLEAR MEDICINE TUMOR LOCALIZATION. PYP CARDIAC AMYLOIDOSIS SCAN WITH SPECT TECHNIQUE: Following intravenous administration of radiopharmaceutical, anterior planar images of the chest were obtained. Regions of interest were placed on the heart  and contralateral chest wall for quantitative assessment. Additional SPECT imaging of the chest was obtained. RADIOPHARMACEUTICALS:  21.2 mCi TECHNETIUM 99 PYROPHOSPHATE FINDINGS: Planar Visual assessment: There is considerable artifact secondary to scoliosis deformity. Anterior planar imaging demonstrates radiotracer uptake within the heart less than than uptake within the adjacent ribs (Grade 0). Quantitative assessment : Quantitative assessment of the cardiac uptake compared to the contralateral chest wall is equal to (H/CL = 0.73). SPECT assessment: SPECT imaging of the chest demonstrates no significant radiotracer accumulation within the LEFT ventricle. IMPRESSION: Visual and quantitative assessment (grade 1, H/CLL equal 0.73) are not  suggestive of transthyretin amyloidosis. Electronically Signed   By: Kerby Moors M.D.   On: 08/26/2019 09:19     Medications:     Scheduled Medications:  allopurinol  100 mg Oral Daily   aspirin EC  81 mg Oral Daily   busPIRone  15 mg Oral BID   Chlorhexidine Gluconate Cloth  6 each Topical Daily   enoxaparin (LOVENOX) injection  0.5 mg/kg Subcutaneous Daily   FLUoxetine  20 mg Oral QHS   hydrALAZINE  12.5 mg Oral Q8H   insulin aspart  0-9 Units Subcutaneous TID WC   insulin glargine  60 Units Subcutaneous Daily   isosorbide mononitrate  30 mg Oral Daily   mouth rinse  15 mL Mouth Rinse BID   nicotine  21 mg Transdermal Daily   pantoprazole  40 mg Oral Daily   polyethylene glycol  17 g Oral BID   potassium chloride SA  20 mEq Oral Daily   pregabalin  75 mg Oral BID   rosuvastatin  20 mg Oral Daily   sodium chloride flush  3 mL Intravenous Q12H   topiramate  25 mg Oral BID   torsemide  20 mg Oral Daily    Infusions:   PRN Medications: acetaminophen **OR** acetaminophen, albuterol, hydrALAZINE, nitroGLYCERIN, ondansetron **OR** ondansetron (ZOFRAN) IV   Assessment/Plan:   1. Cardiogenic shock  - initial swan numbers confirmed biventricular failure with low CI @ 1.6 Resolved.    2. Acute biventricular HF  - echo EF 25-30%. RV severely HK  - EF newly down. Troponin normal. Clearly not ACS.  - Cath 2018 with minimal CAD - Will not repeat coronary angio currently with AKI - initial swan numbers confirmed biventricular failure with low CI @ 1.6. much improved with milrinone and diuresis.  -   Volume status stable. Continue torsemide 20 mg daily.  -Increase hydralazine to 25 mg three times a day + 30 mg imdur.  - Add 12.5 mg losartan daily. Check BMEt next week at follow up.   Myeloma panel- no M spike and PYP  Scan negative for amyloid.   3. Acute on chronic hypoxic respiratory failure - multifactorial  - continue O2.  Continue to wean oxygen.  Wean oxygen as needed.   4. PAH with /cor pulmonale - likely who GROUP II & III - Has sleep apnea but has not used CPAP.  -Will need repeat sleep study. We will set up at follow up .   5. AKI - baseline creatinine 1.4-1.7 - Creatinine peaked at 2.73.  - Creatinine stable at 1.7  - DM and HTN also likely contributing - avoid nephrotoxic meds  6. COPD with ongoing tobacco use - discussed need to quit  7. Morbid obesity - need weight loss  8. OSA - needs CPAP.  Repeat sleep study.   9. DM2 - continue SSI - consider SGLT2i at some point - renal function  permitting  10. Deconditioning PT consulted with recommendations for HHPT.   11. HTN - BP up today  HF Meds for d/c  Losartan 12.5 mg daily  Hydralazine 25 mg three times a day  Imdur 30 mg daily  Torsemide 20 mg daily.  KDUR 20 meq daily  Crestor 20 mg daily   HF follow up has been set up.     Length of Stay: [place] NP-C  08/26/2019, 11:51 AM  Advanced Heart Failure Team Pager [phone] (M-F; 7a - 4p)  Please contact [facility] for night-coverage after hours (4p -7a ) and weekends on [email or web address]  Patient seen and examined with the above-signed Advanced Practice Provider and/or Housestaff. I personally reviewed laboratory data, imaging studies and relevant notes. I independently examined the patient and formulated the important aspects of the plan. I have edited the note to reflect any of my changes or salient points. I have personally discussed the plan with the patient and/or family.  Much improved. Volume status looks good. Creatinine now back to baseline. Agree with d/c today on meds listed above. Will need outpatient sleep study. F/u closely with [place] in [facility] and Dr. Hollie Salk in Nephrology. Discussed smoking cessation.   Glori Bickers, MD  2:59 PM

## 2019-08-26 NOTE — Discharge Summary (Addendum)
Physician Discharge Summary  [name] GYF:[id number] DOB: June 18, 1956 DOA: 08/16/2019  PCP: Luetta Nutting, DO  Admit date: 08/16/2019 Discharge date: 08/26/2019  Time spent: 35 minutes  Recommendations for Outpatient Follow-up:  1. CHF clinic 9/24, please check bmet at follow-up 2. Needs outpatient sleep study 3. Counseled regarding weight loss   Discharge Diagnoses:  Principal Problem:   Acute diastolic CHF (congestive heart failure) (HCC) Active Problems:   Essential hypertension   Morbid obesity due to excess calories (HCC)   HLD (hyperlipidemia)   OSA (obstructive sleep apnea)   COPD  GOLD 0 still smoking   Pulmonary hypertension, unspecified (HCC)   Type 2 diabetes mellitus with stage 3 chronic kidney disease, with long-term current use of insulin ([place])   Discharge Condition: Improved  Diet recommendation: Diabetic, heart healthy  Filed Weights   08/24/19 0500 08/25/19 0349 08/26/19 0625  Weight: 108.5 kg 108.9 kg 107.1 kg    History of present illness:  63 y.o.femalewithhistory of chronic diastolic CHF, hypertension, chronic kidney disease, pulmonary hypertension diabetes mellitus, CVA, CSF rhinorrhea status post reconstruction surgery sleep apnea and ongoing tobacco abuse presents to the ER because of increasing shortness of breath over the last 3 weeks, work-up significant for volume overload,   Hospital Course:   Acute on chronic combined systolic/diastolic CHF, and biventricular failure/cardiogenic shock. -Most recent echo and 2018 showing EF was 60 to 65% with grade 2 diastolic dysfunction - repeat echo this admission showing EF 30-35% w/ diffuse hypokinesis, severe concentric LVH and severely educed RV systolic function -Left heart cath in 2018 with minimal CAD felt to have primarily nonischemic cardiomyopathy.  -Initial Swan cath numbers confirmed biventricular failure with low CI at 1.6, this has resolved -CHF team was consulted, patient required  admission to ICU, with [facility] cath monitoring, on milrinone drip, Lasix drip -Diuresed well, volume status improved, negative 9.6 L -Discharged home on torsemide 20 mg daily along with hydralazine 25 mg 3 times daily, Imdur 30 mg daily, losartan 12.5 mg daily with KCl -Myeloma panel did not show an M spike and PYP scan was negative for amyloidosis -Follow-up in the CHF clinic on 9/24 with labs  Pulmonary artery hypertension/cor pulmonale -We will need repeat sleep study, will request on discharge summary for PCP to send for sleep study as an outpatient, has sleep apnea but has not been using CPAP in the past.  AKI on Chronic kidney disease stage III  -Baseline creatinine around 1.5, peaked at 2.2, improved with milrinone drip, most recent is 1.7 . -Renal input greatly appreciated -Stable now  Hypertension -Stable, started on low-dose hydralazine, and Imdur.  Diabetes mellitus type 2 -Patient was reportedly on 120 units of Lantus daily however was hypoglycemic on this regimen and required much lower doses of insulin in the hospital, CBGs controlled on Lantus 60 units daily and sliding scale -Titrate further at follow-up with PCP  Sleep apnea  -We will need repeat sleep study as an outpatient  UTI -Treated, completed 3 days of IV ceftriaxone  COPD  - not actively wheezing continue inhalers.   Tobacco abuse  -Counseled  Hyperlipidemia on statins.  History of CVA  - on statins and aspirin  History of gout  - on allopurinol.  History of depression and anxiety  - on Prozac and BuSpar.  Reports she is forgetful, with excessive sleepiness, gabapentin dose decreased to 429m QHS  Consultations:  CHF team  Discharge Exam: Vitals:   08/26/19 1007 08/26/19 1421  BP: (!) 154/73 (Marland Kitchen  145/103  Pulse: 74 82  Resp: 20 20  Temp: 98.3 F (36.8 C)   SpO2: 92% 93%    General: AAO x3 Cardiovascular: S1-S2 regular rate rhythm Respiratory: Clear  bilaterally  Discharge Instructions   Discharge Instructions    Diet - low sodium heart healthy   Complete by: As directed    Increase activity slowly   Complete by: As directed      Allergies as of 08/26/2019      Reactions   Atorvastatin Other (See Comments), Nausea And Vomiting   "legs cramped; moderate to almost severe" "legs cramped; moderate to almost severe"   Ibuprofen Other (See Comments)   Other reaction(s): Other (See Comments) Per pt, MD doesn't want pt to take Per pt, MD doesn't want pt to take   Metformin And Related Nausea And Vomiting   Ace Inhibitors Cough, Nausea And Vomiting   Hydrocodone-acetaminophen Nausea And Vomiting   Albuterol Other (See Comments)   Caused SVT      Medication List    STOP taking these medications   valsartan 40 MG tablet Commonly known as: DIOVAN   Verapamil HCl CR 300 MG Cp24     TAKE these medications   Accu-Chek Aviva Plus w/Device Kit Patient is to test four times daily. Dx. E11.42   acetaminophen 325 MG tablet Commonly known as: TYLENOL Take 2 tablets (650 mg total) by mouth every 6 (six) hours as needed for mild pain.   albuterol 108 (90 Base) MCG/ACT inhaler Commonly known as: VENTOLIN HFA Inhale 2 puffs into the lungs every 6 (six) hours as needed for wheezing or shortness of breath.   allopurinol 100 MG tablet Commonly known as: ZYLOPRIM Take 100 mg by mouth daily.   aspirin EC 81 MG tablet Take 1 tablet (81 mg total) by mouth daily.   busPIRone 15 MG tablet Commonly known as: BUSPAR Take 15 mg by mouth 2 (two) times daily.   Chantix Starting Month Pak 0.5 MG X 11 & 1 MG X 42 tablet Generic drug: varenicline Take one 0.5 mg tab po once daily for 3 days, then increase to one 0.5 mg tab bid for 4 days, then increase to one 1 mg tab bid.   FLUoxetine 20 MG capsule Commonly known as: PROZAC TAKE 1 CAPSULE (20 MG TOTAL) BY MOUTH AT BEDTIME.   gabapentin 400 MG capsule Commonly known as: NEURONTIN Take  1 capsule (400 mg total) by mouth at bedtime. What changed: See the new instructions.   hydrALAZINE 25 MG tablet Commonly known as: APRESOLINE Take 1 tablet (25 mg total) by mouth every 8 (eight) hours.   Insulin Glargine 300 UNIT/ML Sopn Inject 60 Units into the skin daily. What changed:   how much to take  when to take this  Another medication with the same name was removed. Continue taking this medication, and follow the directions you see here.   isosorbide mononitrate 30 MG 24 hr tablet Commonly known as: IMDUR Take 1 tablet (30 mg total) by mouth daily. ** DO NOT CRUSH ** What changed:   medication strength  how much to take   losartan 25 MG tablet Commonly known as: COZAAR Take 0.5 tablets (12.5 mg total) by mouth daily. Start taking on: August 27, 2019   nitroGLYCERIN 0.4 MG SL tablet Commonly known as: NITROSTAT Place 1 tablet (0.4 mg total) under the tongue every 5 (five) minutes as needed. What changed: reasons to take this   NovoLOG FlexPen 100 UNIT/ML FlexPen Generic  drug: insulin aspart Inject 5 Units into the skin 3 (three) times daily with meals. What changed: how much to take   omeprazole 40 MG capsule Commonly known as: PRILOSEC Take 30- 60 min before your first and last meals of the day What changed:   how much to take  how to take this  when to take this  additional instructions   OneTouch Verio test strip Generic drug: glucose blood For E11.22   potassium chloride SA 20 MEQ tablet Commonly known as: Klor-Con M20 Take 1 tablet (20 mEq total) by mouth daily. Please make overdue appt with Dr. Harrington Challenger before anymore refills. 1st attempt   rosuvastatin 20 MG tablet Commonly known as: Crestor Take 1 tablet (20 mg total) by mouth daily.   topiramate 25 MG tablet Commonly known as: TOPAMAX Take 1 tablet (25 mg total) by mouth 2 (two) times daily.   torsemide 20 MG tablet Commonly known as: DEMADEX Take 1 tablet (20 mg total) by  mouth daily. Start taking on: August 27, 2019            Durable Medical Equipment  (From admission, onward)         Start     Ordered   08/26/19 0940  For home use only DME oxygen  Once    Question Answer Comment  Length of Need 6 Months   Mode or (Route) Nasal cannula   Liters per Minute 2   Frequency Continuous (stationary and portable oxygen unit needed)   Oxygen conserving device Yes   Oxygen delivery system Gas      08/26/19 0939   08/24/19 1654  For home use only DME 4 wheeled rolling walker with seat  Once    Question:  Patient needs a walker to treat with the following condition  Answer:  Weakness   08/24/19 1654         Allergies  Allergen Reactions  . Atorvastatin Other (See Comments) and Nausea And Vomiting    "legs cramped; moderate to almost severe" "legs cramped; moderate to almost severe"  . Ibuprofen Other (See Comments)    Other reaction(s): Other (See Comments) Per pt, MD doesn't want pt to take Per pt, MD doesn't want pt to take  . Metformin And Related Nausea And Vomiting  . Ace Inhibitors Cough and Nausea And Vomiting  . Hydrocodone-Acetaminophen Nausea And Vomiting  . Albuterol Other (See Comments)    Caused SVT   Follow-up Information    [facility] Follow up.   Specialty: Home Health Services Why: For home health services. They will call you in 1-2 after DC.  Contact information: [address] [place] [address] [phone]        [facility] Follow up.   Why: Rollator- to be delivered to room prior to trnsition home.  Contact information: [place] [address] [phone]        [facility] Follow up.   Why: rollator,         [facility] Follow up on 09/01/2019.   Specialty: Cardiology Why: 1:30 Garage Code 9007 Contact information: [address] [id number] [place] [phone]           The results of significant diagnostics from this hospitalization (including imaging, microbiology, ancillary and laboratory) are listed below for reference.    Significant Diagnostic Studies: Dg Chest 2 View  Result Date: 08/16/2019 CLINICAL DATA:  63 year old female with chest pain and shortness of breath. EXAM: CHEST - 2 VIEW COMPARISON:  Chest radiograph dated 01/05/2019 FINDINGS: Small left pleural effusion and left lung base atelectasis versus infiltrate. Overall slight interval increase in the size of the left pleural effusion and associated left lung base opacity compared to the prior radiograph. The right lung remains clear. No pneumothorax. Stable cardiac silhouette. No acute osseous pathology. Degenerative changes of the spine. IMPRESSION: Small left pleural effusion and left lung base atelectasis versus infiltrate, increased in size since the prior radiograph. Electronically Signed   By: Anner Crete M.D.   On: 08/16/2019 19:01   Nm Cardiac Amyloid Tumor Loc Inflam Spect 1 Day  Result Date: 08/26/2019 CLINICAL DATA:  HEART FAILURE. CONCERN FOR CARDIAC AMYLOIDOSIS. EXAM: NUCLEAR MEDICINE TUMOR LOCALIZATION. PYP CARDIAC AMYLOIDOSIS SCAN WITH SPECT TECHNIQUE: Following intravenous administration of radiopharmaceutical, anterior planar images of the chest were obtained. Regions of interest were placed on the heart and contralateral chest wall for quantitative assessment. Additional SPECT imaging of the chest was obtained. RADIOPHARMACEUTICALS:  21.2 mCi TECHNETIUM 99 PYROPHOSPHATE FINDINGS: Planar Visual assessment: There is considerable artifact secondary to scoliosis deformity. Anterior planar imaging demonstrates radiotracer uptake within the heart less than than uptake within the adjacent ribs (Grade 0). Quantitative assessment : Quantitative assessment of the cardiac uptake compared to the contralateral chest wall is equal to (H/CL = 0.73). SPECT  assessment: SPECT imaging of the chest demonstrates no significant radiotracer accumulation within the LEFT ventricle. IMPRESSION: Visual and quantitative assessment (grade 1, H/CLL equal 0.73) are not suggestive of transthyretin amyloidosis. Electronically Signed   By: Kerby Moors M.D.   On: 08/26/2019 09:19   Dg Chest Port 1 View  Result Date: 08/20/2019 CLINICAL DATA:  Central line placement EXAM: PORTABLE CHEST 1 VIEW COMPARISON:  08/16/2019, 01/05/2019 FINDINGS: Insertion of right IJ Swan-Ganz catheter, tip projects over right lower lobe pulmonary artery. Negative for right pneumothorax. Suspected left pleural effusion and left basilar airspace disease. Possible small right effusion. Aortic atherosclerosis. Cardiomegaly. IMPRESSION: 1. Right IJ Swan-Ganz catheter tip projects over right lower lobe pulmonary artery and may be withdrawn for more optimal positioning. Negative for right pneumothorax 2. Cardiomegaly with left pleural effusion and left basilar airspace disease. Probable small right pleural effusion. Electronically Signed   By: Donavan Foil M.D.   On: 08/20/2019 15:59    Microbiology: Recent Results (from the past 240 hour(s))  SARS Coronavirus 2 [facility] order, Performed in [facility] hospital lab) Nasopharyngeal Nasopharyngeal Swab     Status: None   Collection Time: 08/17/19 12:38 AM   Specimen: Nasopharyngeal Swab  Result Value Ref Range Status   SARS Coronavirus 2 NEGATIVE NEGATIVE Final    Comment: (NOTE) If result is NEGATIVE SARS-CoV-2 target nucleic acids are NOT DETECTED. The SARS-CoV-2 RNA is generally detectable in upper and lower  respiratory specimens during the acute phase of infection. The lowest  concentration of SARS-CoV-2 viral copies this assay can detect is 250  copies / mL. A negative result does not preclude SARS-CoV-2 infection  and should not be used as the sole basis for treatment or other  patient management decisions.  A negative result may occur  with  improper specimen collection / handling, submission of specimen other  than nasopharyngeal swab, presence of viral mutation(s) within the  areas targeted by this assay, and inadequate number of viral copies  (<250 copies / mL). A negative result must be combined with clinical  observations, patient history, and epidemiological information. If  result is POSITIVE SARS-CoV-2 target nucleic acids are DETECTED. The SARS-CoV-2 RNA is generally detectable in upper and lower  respiratory specimens dur ing the acute phase of infection.  Positive  results are indicative of active infection with SARS-CoV-2.  Clinical  correlation with patient history and other diagnostic information is  necessary to determine patient infection status.  Positive results do  not rule out bacterial infection or co-infection with other viruses. If result is PRESUMPTIVE POSTIVE SARS-CoV-2 nucleic acids MAY BE PRESENT.   A presumptive positive result was obtained on the submitted specimen  and confirmed on repeat testing.  While 2019 novel coronavirus  (SARS-CoV-2) nucleic acids may be present in the submitted sample  additional confirmatory testing may be necessary for epidemiological  and / or clinical management purposes  to differentiate between  SARS-CoV-2 and other Sarbecovirus currently known to infect humans.  If clinically indicated additional testing with an alternate test  methodology [phone]) is advised. The SARS-CoV-2 RNA is generally  detectable in upper and lower respiratory sp ecimens during the acute  phase of infection. The expected result is Negative. Fact Sheet for Patients:  StrictlyIdeas.no Fact Sheet for Healthcare Providers: BankingDealers.co.za This test is not yet approved or cleared by the [place] FDA and has been authorized for detection and/or diagnosis of SARS-CoV-2 by FDA under an Emergency Use Authorization (EUA).  This EUA  will remain in effect (meaning this test can be used) for the duration of the COVID-19 declaration under Section 564(b)(1) of the Act, 21 U.S.C. section 360bbb-3(b)(1), unless the authorization is terminated or revoked sooner. Performed at [facility], [place] [address] [place], [place] [address]   Culture, Urine     Status: Abnormal   Collection Time: 08/19/19  3:51 PM   Specimen: Urine, Clean Catch  Result Value Ref Range Status   Specimen Description URINE, CLEAN CATCH  Final   Special Requests   Final    NONE Performed at [facility], [place] [address] [place], [place] [address]    Culture MULTIPLE SPECIES PRESENT, SUGGEST RECOLLECTION (A)  Final   Report Status 08/20/2019 FINAL  Final  MRSA PCR Screening     Status: None   Collection Time: 08/22/19  1:33 PM   Specimen: Nasal Mucosa; Nasopharyngeal  Result Value Ref Range Status   MRSA by PCR NEGATIVE NEGATIVE Final    Comment:        The GeneXpert MRSA Assay (FDA approved for NASAL specimens only), is one component of a comprehensive MRSA colonization surveillance program. It is not intended to diagnose MRSA infection nor to guide or monitor treatment for MRSA infections. Performed at [facility], [place] [address] [place], [place] [address]      Labs: Basic Metabolic Panel: Recent Labs  Lab 08/20/19 [phone]  08/22/19 0415 08/23/19 0320 08/24/19 0435 08/25/19 0225 08/26/19 0146  NA 142   < > 139 140 139 140 139  K 4.3   < > 4.1 4.2 4.1 4.9 4.5  CL 102   < > 93* 93* 95* 96* 98  CO2 27   < > 35* 36* 32 35* 32  GLUCOSE 93   < > 184* 132* 126* 132* 127*  BUN 32*   < > 34* 32* 29* 27* 27*  CREATININE 2.73*   < > 2.69* 2.31* 1.79* 1.78* 1.79*  CALCIUM 8.1*   < > 7.6* 7.7* 8.0* 8.5* 8.7*  MG 1.9  --  1.7  --  2.2  --   --    < > =  values in this interval not displayed.   Liver Function Tests: No results for input(s): AST, ALT, ALKPHOS, BILITOT, PROT, ALBUMIN in the last 168 hours. No results for  input(s): LIPASE, AMYLASE in the last 168 hours. No results for input(s): AMMONIA in the last 168 hours. CBC: Recent Labs  Lab 08/22/19 0415 08/23/19 0320 08/24/19 0435 08/25/19 0225 08/26/19 0146  WBC 8.7 8.2 7.7 7.2 6.6  HGB 14.1 14.4 15.5* 15.5* 15.5*  HCT 46.0 46.7* 51.1* 50.6* 50.0*  MCV 95.6 94.7 94.8 95.1 93.8  PLT 154 157 163 182 174   Cardiac Enzymes: No results for input(s): CKTOTAL, CKMB, CKMBINDEX, TROPONINI in the last 168 hours. BNP: BNP (last 3 results) Recent Labs    08/17/19 0457  BNP 1,427.6*    ProBNP (last 3 results) Recent Labs    05/23/19 1201  PROBNP 1,442.0*    CBG: Recent Labs  Lab 08/25/19 1111 08/25/19 1641 08/25/19 2124 08/26/19 0624 08/26/19 1115  GLUCAP 123* 118* 107* 96 97       Signed:  Domenic Polite MD.  [facility] 08/26/2019, 2:48 PM

## 2019-08-26 NOTE — Progress Notes (Signed)
Physical Therapy Treatment Patient Details Name: [name] MRN: [id number] DOB: [birth year] Today's Date: 08/26/2019    History of Present Illness 63 y.o. female with history of chronic diastolic CHF, hypertension, chronic kidney disease, pulmonary hypertension diabetes mellitus, CVA, CSF rhinorrhea status post reconstruction surgery sleep apnea and ongoing tobacco abuse admitted with SOB, CHF exacerbation.  Difficulty with diuresing with renal function so transferred to ICU.    PT Comments    Pt performed gt training and progression to stair training she fatigued quickly with activity.  HR stable at 97 bpm.  SPO2 on RA stable at 90%-94% on RA.  Pt reports feeling flushed and dizzy post session and requested temp to be taken which was within normal limits.  Plan for d/c home to her sisters house.  Follow Up Recommendations  Home health PT;Supervision/Assistance - 24 hour     Equipment Recommendations  Other (comment)(4 wheeled walker with a seat.)    Recommendations for Other Services       Precautions / Restrictions Precautions Precautions: Fall Restrictions Weight Bearing Restrictions: No    Mobility  Bed Mobility               General bed mobility comments: Pt seated in recliner.  Transfers Overall transfer level: Needs assistance Equipment used: 4-wheeled walker Transfers: Sit to/from Stand Sit to Stand: Supervision         General transfer comment: Cues for sequencing and hand placement.  Pt able to demonstrate safety with use of rollator brakes and backing device to wall when needing to sit.  Ambulation/Gait Ambulation/Gait assistance: Min guard Gait Distance (Feet): 120 Feet(x2 trials.) Assistive device: 4-wheeled walker Gait Pattern/deviations: Step-through pattern;Decreased stride length;Wide base of support     General Gait Details: Pt moving well with rollator she continues to fatigue quickly and required cues for pacing and rest periods.   required seated rest break x1 due to fatigue and feeling flushed.  Continues to complain of dizziness.   Stairs    Min guard x2 stairs with B rails.  Cues for sequencing and hand placement.           Wheelchair Mobility    Modified Rankin (Stroke Patients Only)       Balance Overall balance assessment: Needs assistance   Sitting balance-Leahy Scale: Fair       Standing balance-Leahy Scale: Fair Standing balance comment: UE support in standing                            Cognition Arousal/Alertness: Awake/alert Behavior During Therapy: WFL for tasks assessed/performed Overall Cognitive Status: Within Functional Limits for tasks assessed                                        Exercises      General Comments        Pertinent Vitals/Pain Pain Assessment: Faces Faces Pain Scale: Hurts even more Pain Location: knees Pain Descriptors / Indicators: Aching;Burning Pain Intervention(s): Monitored during session    Home Living                      Prior Function            PT Goals (current goals can now be found in the care plan section) Acute Rehab PT Goals Patient Stated Goal: to return to independent  Potential to Achieve Goals: Good Progress towards PT goals: Progressing toward goals    Frequency    Min 3X/week      PT Plan Current plan remains appropriate    Co-evaluation              AM-PAC PT "6 Clicks" Mobility   Outcome Measure  Help needed turning from your back to your side while in a flat bed without using bedrails?: A Little Help needed moving from lying on your back to sitting on the side of a flat bed without using bedrails?: A Little Help needed moving to and from a bed to a chair (including a wheelchair)?: A Little Help needed standing up from a chair using your arms (e.g., wheelchair or bedside chair)?: A Little Help needed to walk in hospital room?: A Little Help needed climbing 3-5  steps with a railing? : A Little 6 Click Score: 18    End of Session Equipment Utilized During Treatment: Oxygen Activity Tolerance: Patient tolerated treatment well Patient left: in chair;with call bell/phone within reach Nurse Communication: Mobility status PT Visit Diagnosis: Other abnormalities of gait and mobility (R26.89);Muscle weakness (generalized) (M62.81)     Time: 1100-1116 PT Time Calculation (min) (ACUTE ONLY): 16 min  Charges:  $Gait Training: 8-22 mins                     [name], PTA Acute Rehabilitation Services Pager [phone] Office [phone]     Carsten Carstarphen Eli Hose 08/26/2019, 11:25 AM

## 2019-08-26 NOTE — Plan of Care (Signed)
Progressing

## 2019-08-27 DIAGNOSIS — G4733 Obstructive sleep apnea (adult) (pediatric): Secondary | ICD-10-CM | POA: Diagnosis not present

## 2019-08-27 DIAGNOSIS — M1990 Unspecified osteoarthritis, unspecified site: Secondary | ICD-10-CM | POA: Diagnosis not present

## 2019-08-27 DIAGNOSIS — N390 Urinary tract infection, site not specified: Secondary | ICD-10-CM | POA: Diagnosis not present

## 2019-08-27 DIAGNOSIS — E1122 Type 2 diabetes mellitus with diabetic chronic kidney disease: Secondary | ICD-10-CM | POA: Diagnosis not present

## 2019-08-27 DIAGNOSIS — I471 Supraventricular tachycardia: Secondary | ICD-10-CM | POA: Diagnosis not present

## 2019-08-27 DIAGNOSIS — M1030 Gout due to renal impairment, unspecified site: Secondary | ICD-10-CM | POA: Diagnosis not present

## 2019-08-27 DIAGNOSIS — N183 Chronic kidney disease, stage 3 (moderate): Secondary | ICD-10-CM | POA: Diagnosis not present

## 2019-08-27 DIAGNOSIS — N179 Acute kidney failure, unspecified: Secondary | ICD-10-CM | POA: Diagnosis not present

## 2019-08-27 DIAGNOSIS — F419 Anxiety disorder, unspecified: Secondary | ICD-10-CM | POA: Diagnosis not present

## 2019-08-27 DIAGNOSIS — I2781 Cor pulmonale (chronic): Secondary | ICD-10-CM | POA: Diagnosis not present

## 2019-08-27 DIAGNOSIS — F329 Major depressive disorder, single episode, unspecified: Secondary | ICD-10-CM | POA: Diagnosis not present

## 2019-08-27 DIAGNOSIS — I6523 Occlusion and stenosis of bilateral carotid arteries: Secondary | ICD-10-CM | POA: Diagnosis not present

## 2019-08-27 DIAGNOSIS — E785 Hyperlipidemia, unspecified: Secondary | ICD-10-CM | POA: Diagnosis not present

## 2019-08-27 DIAGNOSIS — I272 Pulmonary hypertension, unspecified: Secondary | ICD-10-CM | POA: Diagnosis not present

## 2019-08-27 DIAGNOSIS — K5790 Diverticulosis of intestine, part unspecified, without perforation or abscess without bleeding: Secondary | ICD-10-CM | POA: Diagnosis not present

## 2019-08-27 DIAGNOSIS — J449 Chronic obstructive pulmonary disease, unspecified: Secondary | ICD-10-CM | POA: Diagnosis not present

## 2019-08-27 DIAGNOSIS — Z7982 Long term (current) use of aspirin: Secondary | ICD-10-CM | POA: Diagnosis not present

## 2019-08-27 DIAGNOSIS — F17210 Nicotine dependence, cigarettes, uncomplicated: Secondary | ICD-10-CM | POA: Diagnosis not present

## 2019-08-27 DIAGNOSIS — G960 Cerebrospinal fluid leak: Secondary | ICD-10-CM | POA: Diagnosis not present

## 2019-08-27 DIAGNOSIS — I5082 Biventricular heart failure: Secondary | ICD-10-CM | POA: Diagnosis not present

## 2019-08-27 DIAGNOSIS — M419 Scoliosis, unspecified: Secondary | ICD-10-CM | POA: Diagnosis not present

## 2019-08-27 DIAGNOSIS — I2510 Atherosclerotic heart disease of native coronary artery without angina pectoris: Secondary | ICD-10-CM | POA: Diagnosis not present

## 2019-08-27 DIAGNOSIS — I5043 Acute on chronic combined systolic (congestive) and diastolic (congestive) heart failure: Secondary | ICD-10-CM | POA: Diagnosis not present

## 2019-08-27 DIAGNOSIS — I110 Hypertensive heart disease with heart failure: Secondary | ICD-10-CM | POA: Diagnosis not present

## 2019-08-29 DIAGNOSIS — I5082 Biventricular heart failure: Secondary | ICD-10-CM | POA: Diagnosis not present

## 2019-08-29 DIAGNOSIS — I110 Hypertensive heart disease with heart failure: Secondary | ICD-10-CM | POA: Diagnosis not present

## 2019-08-29 DIAGNOSIS — I2781 Cor pulmonale (chronic): Secondary | ICD-10-CM | POA: Diagnosis not present

## 2019-08-29 DIAGNOSIS — I5043 Acute on chronic combined systolic (congestive) and diastolic (congestive) heart failure: Secondary | ICD-10-CM | POA: Diagnosis not present

## 2019-08-29 DIAGNOSIS — N179 Acute kidney failure, unspecified: Secondary | ICD-10-CM | POA: Diagnosis not present

## 2019-08-29 DIAGNOSIS — I272 Pulmonary hypertension, unspecified: Secondary | ICD-10-CM | POA: Diagnosis not present

## 2019-08-30 ENCOUNTER — Telehealth: Payer: Self-pay

## 2019-08-30 NOTE — Telephone Encounter (Signed)
Noted.  Should be ok since this is her fast acting insulin and this occurred 3 days ago.  She should double check which insulin she is giving herself before giving.    Please schedule a hospital follow up with me ASAP and have her bring all medications that she is taking to this appt.  Thanks!

## 2019-08-30 NOTE — Telephone Encounter (Signed)
Spoke with Langley Gauss from [place]. She states 3 days ago patient took 60 units of Novolog instead of using insulin Glargine. Langley Gauss states that pt hadn't had all of her medications and so she took what she had and dropped her sugar way down. EMS came out she is a lot better now. She states that she labeled everything for pt and that hopefully she will read her labels. Pt moved in with her sister and they are currently helping with making sure she is taking her sugar regularly and weight. She has currently stopped smoking with the chantix. Langley Gauss does not think that pt was being very compliant with medications but not that she moved in with sister hopefully that changes. She is currently not taking Buspirone or Fluoxetine. I will call pt to schedule a closer appointment time for a Hospital f/u and advise pt to bring all of her medications.   Copied from [place] [phone]. Topic: General - Other >> Aug 30, 2019  9:41 AM Carolyn Stare wrote: would like a call back , Langley Gauss with [name] calll to say pt had low blood sugar do to a medication error  [phone]

## 2019-08-30 NOTE — Telephone Encounter (Signed)
Left pt a VM to call back and ask for [name], need to schedule pt for a Hospital Follow up and make sure she is aware that she brings every medication that she takes with her to the appointment.

## 2019-08-30 NOTE — Telephone Encounter (Signed)
Spoke with pt. Made pt appointment for Monday 9/28. Pt states that she had PT and follow up with the heart doctor so she was unable to do this week. Pt agreed to bringing all of her medications.

## 2019-09-01 ENCOUNTER — Other Ambulatory Visit: Payer: Self-pay

## 2019-09-01 ENCOUNTER — Encounter (HOSPITAL_COMMUNITY): Payer: Self-pay

## 2019-09-01 ENCOUNTER — Ambulatory Visit (HOSPITAL_COMMUNITY)
Admission: RE | Admit: 2019-09-01 | Discharge: 2019-09-01 | Disposition: A | Discharge status: Home / Self Care | Payer: Medicare Other | Source: Ambulatory Visit | Attending: Cardiology | Admitting: Cardiology

## 2019-09-01 VITALS — BP 128/64 | HR 74 | Wt 234.4 lb

## 2019-09-01 DIAGNOSIS — Z6841 Body Mass Index (BMI) 40.0 and over, adult: Secondary | ICD-10-CM | POA: Diagnosis not present

## 2019-09-01 DIAGNOSIS — N183 Chronic kidney disease, stage 3 unspecified: Secondary | ICD-10-CM

## 2019-09-01 DIAGNOSIS — G4733 Obstructive sleep apnea (adult) (pediatric): Secondary | ICD-10-CM | POA: Diagnosis not present

## 2019-09-01 DIAGNOSIS — Z833 Family history of diabetes mellitus: Secondary | ICD-10-CM | POA: Diagnosis not present

## 2019-09-01 DIAGNOSIS — Z7982 Long term (current) use of aspirin: Secondary | ICD-10-CM | POA: Insufficient documentation

## 2019-09-01 DIAGNOSIS — Z1211 Encounter for screening for malignant neoplasm of colon: Secondary | ICD-10-CM | POA: Diagnosis not present

## 2019-09-01 DIAGNOSIS — I2781 Cor pulmonale (chronic): Secondary | ICD-10-CM | POA: Insufficient documentation

## 2019-09-01 DIAGNOSIS — I130 Hypertensive heart and chronic kidney disease with heart failure and stage 1 through stage 4 chronic kidney disease, or unspecified chronic kidney disease: Secondary | ICD-10-CM | POA: Diagnosis not present

## 2019-09-01 DIAGNOSIS — I5042 Chronic combined systolic (congestive) and diastolic (congestive) heart failure: Secondary | ICD-10-CM | POA: Insufficient documentation

## 2019-09-01 DIAGNOSIS — Z8249 Family history of ischemic heart disease and other diseases of the circulatory system: Secondary | ICD-10-CM | POA: Insufficient documentation

## 2019-09-01 DIAGNOSIS — J449 Chronic obstructive pulmonary disease, unspecified: Secondary | ICD-10-CM | POA: Diagnosis not present

## 2019-09-01 DIAGNOSIS — I5032 Chronic diastolic (congestive) heart failure: Secondary | ICD-10-CM | POA: Diagnosis not present

## 2019-09-01 DIAGNOSIS — E785 Hyperlipidemia, unspecified: Secondary | ICD-10-CM | POA: Insufficient documentation

## 2019-09-01 DIAGNOSIS — I5082 Biventricular heart failure: Secondary | ICD-10-CM | POA: Diagnosis not present

## 2019-09-01 DIAGNOSIS — E1122 Type 2 diabetes mellitus with diabetic chronic kidney disease: Secondary | ICD-10-CM | POA: Diagnosis not present

## 2019-09-01 DIAGNOSIS — I2721 Secondary pulmonary arterial hypertension: Secondary | ICD-10-CM | POA: Insufficient documentation

## 2019-09-01 DIAGNOSIS — I2510 Atherosclerotic heart disease of native coronary artery without angina pectoris: Secondary | ICD-10-CM | POA: Insufficient documentation

## 2019-09-01 DIAGNOSIS — R9431 Abnormal electrocardiogram [ECG] [EKG]: Secondary | ICD-10-CM | POA: Diagnosis not present

## 2019-09-01 DIAGNOSIS — I272 Pulmonary hypertension, unspecified: Secondary | ICD-10-CM | POA: Diagnosis not present

## 2019-09-01 DIAGNOSIS — Z8673 Personal history of transient ischemic attack (TIA), and cerebral infarction without residual deficits: Secondary | ICD-10-CM | POA: Diagnosis not present

## 2019-09-01 DIAGNOSIS — J9611 Chronic respiratory failure with hypoxia: Secondary | ICD-10-CM | POA: Diagnosis not present

## 2019-09-01 DIAGNOSIS — Z794 Long term (current) use of insulin: Secondary | ICD-10-CM | POA: Insufficient documentation

## 2019-09-01 DIAGNOSIS — F17210 Nicotine dependence, cigarettes, uncomplicated: Secondary | ICD-10-CM | POA: Diagnosis not present

## 2019-09-01 LAB — BASIC METABOLIC PANEL
Anion gap: 14 (ref 5–15)
BUN: 34 mg/dL — ABNORMAL HIGH (ref 8–23)
CO2: 27 mmol/L (ref 22–32)
Calcium: 9.2 mg/dL (ref 8.9–10.3)
Chloride: 101 mmol/L (ref 98–111)
Creatinine, Ser: 1.72 mg/dL — ABNORMAL HIGH (ref 0.44–1.00)
GFR calc Af Amer: 36 mL/min — ABNORMAL LOW (ref >60)
GFR calc non Af Amer: 31 mL/min — ABNORMAL LOW (ref >60)
Glucose, Bld: 133 mg/dL — ABNORMAL HIGH (ref 70–99)
Potassium: 4.1 mmol/L (ref 3.5–5.1)
Sodium: 142 mmol/L (ref 135–145)

## 2019-09-01 LAB — CBC
HCT: 50.6 % — ABNORMAL HIGH (ref 36.0–46.0)
Hemoglobin: 16.3 g/dL — ABNORMAL HIGH (ref 12.0–15.0)
MCH: 29.5 pg (ref 26.0–34.0)
MCHC: 32.2 g/dL (ref 30.0–36.0)
MCV: 91.7 fL (ref 80.0–100.0)
Platelets: 203 10*3/uL (ref 150–400)
RBC: 5.52 MIL/uL — ABNORMAL HIGH (ref 3.87–5.11)
RDW: 15.6 % — ABNORMAL HIGH (ref 11.5–15.5)
WBC: 8.5 10*3/uL (ref 4.0–10.5)
nRBC: 0 % (ref 0.0–0.2)

## 2019-09-01 LAB — BRAIN NATRIURETIC PEPTIDE: B Natriuretic Peptide: 889.6 pg/mL — ABNORMAL HIGH (ref 0.0–100.0)

## 2019-09-01 MED ORDER — POTASSIUM CHLORIDE CRYS ER 20 MEQ PO TBCR: 20.0000 meq | EXTENDED_RELEASE_TABLET | Freq: Every day | ORAL | 0 refills | Status: DC

## 2019-09-01 MED ORDER — OMEPRAZOLE 40 MG PO CPDR: DELAYED_RELEASE_CAPSULE | ORAL | 2 refills | Status: DC

## 2019-09-01 MED ORDER — ROSUVASTATIN CALCIUM 20 MG PO TABS: 20.0000 mg | ORAL_TABLET | Freq: Every day | ORAL | 2 refills | Status: DC

## 2019-09-01 NOTE — Progress Notes (Signed)
PCP: Primary Cardiologist: Dr Harrington Challenger HF MD; Dr Haroldine Laws  HPI: Angel Brandau Sprinkleis a 63 y.o.femalewith a hx of morbid obesity, chronic systolic/diastolic CHF, PSVT, prior CVA, HTN, DM, OSA, CKD with baseline creatinine 1.3-1.4, COPD with ongoing tobacco, pulmonary hypertensionand CSF rhinorrhea s/p skull base reconstruction.   Admitted with A/C diastolic heart failure. > 30 pound weight gain in a few weeks after her  PCP discontinued lasix  06/27/2019 secondary to worsening kidney function. In ER markedly volume overloaded. [name] was placed to guide therapy with initial swan numbers showing cardiac index 1.6 and elevated filling pressures. ECHO completed and showed biventricular HF with LVEF 25-30 and RV severely reduced. Placed on milrinone and diuresed with IV lasix. As she improved milrinone was weaned off. HF medications adjusted. Discharge weight was 236 pounds.   Today she returns for post hospital HF follow up.Overall feeling fine. Says she feels better than she has felt in a long time. Mild dyspnea with exertion. Denies PND/Orthopnea. Denies syncope.  Mild dizziness when she stands. Walking a little more at home. Appetite ok. No fever or chills. Weight at home 230 pounds.  Taking all medications. Followed [facility] HH[facility]. Lives with her sister for now. Able to get all medications. Drinking> 64 ounces.   Cardiac Testing Echo 08/18/19 EF 25-30% Mod LVH. RV dilated and severely HK. Personally reviewed  RHC/LHC 07/31/2019  Cardiac catheterization demonstrated minor nonobstructive CAD, mildly elevated LVEDP and moderate pulmonary hypertension. LAD 35% OM2 25% RA 10 PA 57/22 (39) PCWP 19 LVEDP 21 Fick 3.7/1.7   ROS: All systems negative except as listed in HPI, PMH and Problem List.  SH:  Social History   Socioeconomic History  . Marital status: Single    Spouse name: Not on file  . Number of children: 0  . Years of education: Not on file  . Highest education level: Not on  file  Occupational History  . Occupation: disabled  Social Needs  . Financial resource strain: Not on file  . Food insecurity    Worry: Not on file    Inability: Not on file  . Transportation needs    Medical: Not on file    Non-medical: Not on file  Tobacco Use  . Smoking status: Current Every Day Smoker    Packs/day: 0.50    Years: 35.00    Pack years: 17.50    Types: Cigarettes  . Smokeless tobacco: Never Used  . Tobacco comment: 07/26/2012 has decreased smoking from 2ppd; offered cessation materials; pt declines  Substance and Sexual Activity  . Alcohol use: Yes    Alcohol/week: 1.0 standard drinks    Types: 1 Glasses of wine per week    Comment: rarely  . Drug use: No  . Sexual activity: Never    Birth control/protection: Surgical  Lifestyle  . Physical activity    Days per week: Not on file    Minutes per session: Not on file  . Stress: Not on file  Relationships  . Social Herbalist on phone: Not on file    Gets together: Not on file    Attends religious service: Not on file    Active member of club or organization: Not on file    Attends meetings of clubs or organizations: Not on file    Relationship status: Not on file  . Intimate partner violence    Fear of current or ex partner: Not on file    Emotionally abused: Not on file  Physically abused: Not on file    Forced sexual activity: Not on file  Other Topics Concern  . Not on file  Social History Narrative   Currently unemployed.   No children, not married    FH:  Family History  Problem Relation Age of Onset  . Colon cancer Sister 68  . Breast cancer Mother 80  . Alzheimer's disease Mother   . Diabetes Sister 73  . Heart failure Sister   . Diabetes Sister 39  . Lung cancer Father   . Breast cancer Sister   . Cancer Sister        mouth  . Anesthesia problems Neg Hx   . Hypotension Neg Hx   . Malignant hyperthermia Neg Hx   . Pseudochol deficiency Neg Hx     Past Medical  History:  Diagnosis Date  . Anxiety    doesn't take any meds for this  . Arteriosclerosis of coronary artery 08/31/2011   Myoview 9/12: EF 44, no ischemia // Myoview 8/18: EF 57, low risk, possible small area of inferior ischemia // Minor nonobstructive CAD by cardiac catheterization - LHC 8/18: Proximal LAD 35, OM2 25  . Arthritis    knuckles  . Asthma   . Carotid artery disease ([place])    Carotid US 7/14: Bilateral ICA 40-59 // Carotid US ([facility]) 2/17: bilat plaque without sig stenosis  . Chronic diastolic CHF (congestive heart failure) ([place]) 07/26/2017   Echo 7/18:Severe conc LVH, EF 60-65, no RWMA, Gr 2 DD, MAC, mild MR // right and left heart cath 8/18: LVEDP 21  . Claustrophobia 07/26/2012  . COPD (chronic obstructive pulmonary disease) ([place])    "chronic bronchitis - about every winter"  . CSF leak    dizziness/headache - assoc symptoms // 2/2 encephalocele s/p skull base reconstruction  . Diverticulosis   . History of CVA (cerebrovascular accident) 03/2004   denies residual 07/26/2012  . Hx of gout   . Hyperlipidemia    takes Pravastatin daily  . Hypertensive heart disease with chronic diastolic congestive heart failure ([place]) 07/26/2017  . Internal hemorrhoids   . Kidney stones   . LVH (left ventricular hypertrophy)    Echo 8/18: Severe conc LVH, EF 60-65, no RWMA, Gr 2 DD, MAC, mild MR  . OSA (obstructive sleep apnea) 07/26/2012   prior CPAP - stopped due to being primary caregiver for dying parent (stopped in 2016)  . PSVT (paroxysmal supraventricular tachycardia) ([place])   . Pulmonary hypertension, unspecified ([place]) 08/14/2017   cath 07/2017 >>PASP 12m Hg, PA mean 39 mm Hg  . [place]     Current Outpatient Medications  Medication Sig Dispense Refill  . acetaminophen (TYLENOL) 325 MG tablet Take 2 tablets (650 mg total) by mouth every 6 (six) hours as needed for mild pain. 90 tablet 2  . albuterol (VENTOLIN HFA) 108 (90 Base) MCG/ACT inhaler Inhale 2  puffs into the lungs every 6 (six) hours as needed for wheezing or shortness of breath. 1 Inhaler 0  . allopurinol (ZYLOPRIM) 100 MG tablet Take 100 mg by mouth daily.     .Marland Kitchenaspirin EC 81 MG tablet Take 1 tablet (81 mg total) by mouth daily. 90 tablet 3  . Blood Glucose Monitoring Suppl (ACCU-CHEK AVIVA PLUS) w/Device KIT Patient is to test four times daily. Dx. E11.42 1 kit 1  . busPIRone (BUSPAR) 15 MG tablet Take 15 mg by mouth 2 (two) times daily.     .Marland KitchenFLUoxetine (PROZAC) 20  MG capsule TAKE 1 CAPSULE (20 MG TOTAL) BY MOUTH AT BEDTIME. 90 capsule 2  . gabapentin (NEURONTIN) 400 MG capsule Take 1 capsule (400 mg total) by mouth at bedtime.    Marland Kitchen glucose blood (ONETOUCH VERIO) test strip For E11.22 100 strip 12  . hydrALAZINE (APRESOLINE) 25 MG tablet Take 1 tablet (25 mg total) by mouth every 8 (eight) hours. 90 tablet 0  . insulin aspart (NOVOLOG FLEXPEN) 100 UNIT/ML FlexPen Inject 5 Units into the skin 3 (three) times daily with meals.    . Insulin Glargine 300 UNIT/ML SOPN Inject 60 Units into the skin daily.    . isosorbide mononitrate (IMDUR) 30 MG 24 hr tablet Take 1 tablet (30 mg total) by mouth daily. ** DO NOT CRUSH ** 30 tablet 0  . losartan (COZAAR) 25 MG tablet Take 0.5 tablets (12.5 mg total) by mouth daily. 30 tablet 0  . nitroGLYCERIN (NITROSTAT) 0.4 MG SL tablet Place 1 tablet (0.4 mg total) under the tongue every 5 (five) minutes as needed. (Patient taking differently: Place 0.4 mg under the tongue every 5 (five) minutes as needed for chest pain. ) 25 tablet 3  . omeprazole (PRILOSEC) 40 MG capsule Take 30- 60 min before your first and last meals of the day (Patient taking differently: Take 40 mg by mouth 2 (two) times daily. ) 60 capsule 2  . potassium chloride SA (KLOR-CON M20) 20 MEQ tablet Take 1 tablet (20 mEq total) by mouth daily. Please make overdue appt with Dr. Harrington Challenger before anymore refills. 1st attempt 30 tablet 0  . rosuvastatin (CRESTOR) 20 MG tablet Take 1 tablet (20 mg  total) by mouth daily. 90 tablet 2  . topiramate (TOPAMAX) 25 MG tablet Take 1 tablet (25 mg total) by mouth 2 (two) times daily. 180 tablet 1  . torsemide (DEMADEX) 20 MG tablet Take 1 tablet (20 mg total) by mouth daily. 30 tablet 0  . varenicline (CHANTIX STARTING MONTH PAK) 0.5 MG X 11 & 1 MG X 42 tablet TAKE AS DIRECTED ON PACKAGE 53 each 0   No current facility-administered medications for this encounter.     Vitals:   09/01/19 1343  BP: 128/64  Pulse: 74  SpO2: 94%  Weight: 106.3 kg (234 lb 6.4 oz)   Wt Readings from Last 3 Encounters:  09/01/19 106.3 kg (234 lb 6.4 oz)  08/26/19 107.1 kg (236 lb 1.6 oz)  08/16/19 116.6 kg (257 lb)    PHYSICAL EXAM: General:  No resp difficulty HEENT: normal Neck: supple. JVP flat. Carotids 2+ bilaterally; no bruits. No lymphadenopathy or thryomegaly appreciated. Ecchymotic under her chin. Small indurated area from central line. Nontender.  Cor: PMI normal. Regular rate & rhythm. No rubs, gallops or murmurs. Lungs: clear Abdomen: obese, soft, nontender, nondistended. No hepatosplenomegaly. No bruits or masses. Good bowel sounds. Extremities: no cyanosis, clubbing, rash, edema Neuro: alert & orientedx3, cranial nerves grossly intact. Moves all 4 extremities w/o difficulty. Affect pleasant.   ECG: NSR 79 bpm personally    ASSESSMENT & PLAN:  2. Chronic biventricular HF  - echo EF 25-30%. RV severely HK  - EF newly down. Troponin normal. Clearly not ACS. Myeloma panel- no M spike and PYP  Scan negative for amyloid.  - Cath 2018 with minimal CAD - Will not repeat coronary angio currently with AKI - NYHA II-III. Volume status stable. Continue torsemide 20 mg daily.  - Continue hydralazine to 25 mg three times a day + 30 mg imdur.  -  Continue 12.5 mg losartan daily.  - Hold off on increasing arb with recent elevated creatinine.  - Check BMEt today.    3. Chronic hypoxic respiratory failure - multifactorial - Sats stable on room  air.     4. PAH with /cor pulmonale - likely who GROUP II & III - Has sleep apnea but has not used CPAP.  Set up for home sleep study  5. CKE Stage III - baseline creatinine 1.4-1.7 - Check BMET today.   6. COPD with ongoing tobacco use - She has not smoked since discharge.   7. Morbid obesity Body mass index is 42.87 kg/m. Discussed portion control.   8. OSA - needs CPAP.  Repeat sleep study.   9. DM2 - continue SSI - consider SGLT2i at some point - renal function permitting  10. Deconditioning Continue HH PT.    Follow up in 2-3 week with APP and 6 weeks with pharmacy.   Plan to repeat ECHO in 3 months after HF medications optimized.   Crue Otero NP-C   4:36 PM

## 2019-09-01 NOTE — Progress Notes (Signed)
Patient Name: [name]          DOB:  17-Apr-2056      Height:     Weight:  Office Name:         Referring Provider:  Today's Date: 09/01/19  Date:   STOP BANG RISK ASSESSMENT S (snore) Have you been told that you snore?     YES   T (tired) Are you often tired, fatigued, or sleepy during the day?   YES  O (obstruction) Do you stop breathing, choke, or gasp during sleep? YES   P (pressure) Do you have or are you being treated for high blood pressure? YES   B (BMI) Is your body index greater than 35 kg/m? NO   A (age) Are you 63 years old or older? YES   N (neck) Do you have a neck circumference greater than 16 inches?   NO   G (gender) Are you a female? NO   TOTAL STOP/BANG "YES" ANSWERS                                                                        For Office Use Only              Procedure Order Form    YES to 3+ Stop Bang questions OR two clinical symptoms - patient qualifies for WatchPAT (CPT 95800)     Submit: This Form + Patient Face Sheet + Clinical Note via Cloud[employer] or Fax: [phone]         Clinical Notes: Will consult Sleep Specialist and refer for management of therapy due to patient increased risk of Sleep Apnea. Ordering a sleep study due to the following two clinical symptoms: Excessive daytime sleepiness G47.10 / Loud snoring R06.83/ Unrefreshed by sleep G47.8 /  History of high blood pressure R03.0    I understand that I am proceeding with a home sleep apnea test as ordered by my treating physician. I understand that untreated sleep apnea is a serious cardiovascular risk factor and it is my responsibility to perform the test and seek management for sleep apnea. I will be contacted with the results and be managed for sleep apnea by a local sleep physician. I will be receiving equipment and further instructions from [facility] I shall promptly ship back the equipment via the included mailing label. I understand my insurance will be billed for the  test and as the patient I am responsible for any insurance related out-of-pocket costs incurred. I have been provided with written instructions and can call for additional video or telephonic instruction, with 24-hour availability of qualified personnel to answer any questions: Patient Help Desk [phone].

## 2019-09-01 NOTE — Patient Instructions (Addendum)
Lab work done today. We will notify you of any abnormal lab work. No news is good news!  START Potassium 36meq daily.  REFILLED Crestor and Omeprazole  Your provider suggests that your have a home sleep study done. You will be receiving a call in order to set this up.  Please follow up with the [facility] Pharmacist in 6 weeks and the [facility] in 3 weeks.  At the [facility], you and your health needs are our priority. As part of our continuing mission to provide you with exceptional heart care, we have created designated Provider Care Teams. These Care Teams include your primary Cardiologist (physician) and Advanced Practice Providers (APPs- Physician Assistants and Nurse Practitioners) who all work together to provide you with the care you need, when you need it.   You may see any of the following providers on your designated Care Team at your next follow up: Marland Kitchen Dr Glori Bickers . Dr Loralie Champagne . Darrick Grinder, NP   Please be sure to bring in all your medications bottles to every appointment.

## 2019-09-02 DIAGNOSIS — N179 Acute kidney failure, unspecified: Secondary | ICD-10-CM | POA: Diagnosis not present

## 2019-09-02 DIAGNOSIS — I5082 Biventricular heart failure: Secondary | ICD-10-CM | POA: Diagnosis not present

## 2019-09-02 DIAGNOSIS — I272 Pulmonary hypertension, unspecified: Secondary | ICD-10-CM | POA: Diagnosis not present

## 2019-09-02 DIAGNOSIS — I5043 Acute on chronic combined systolic (congestive) and diastolic (congestive) heart failure: Secondary | ICD-10-CM | POA: Diagnosis not present

## 2019-09-02 DIAGNOSIS — I110 Hypertensive heart disease with heart failure: Secondary | ICD-10-CM | POA: Diagnosis not present

## 2019-09-02 DIAGNOSIS — I2781 Cor pulmonale (chronic): Secondary | ICD-10-CM | POA: Diagnosis not present

## 2019-09-05 ENCOUNTER — Other Ambulatory Visit: Payer: Self-pay

## 2019-09-05 ENCOUNTER — Encounter: Payer: Self-pay | Admitting: Family Medicine

## 2019-09-05 ENCOUNTER — Ambulatory Visit (INDEPENDENT_AMBULATORY_CARE_PROVIDER_SITE_OTHER): Payer: Medicare Other | Admitting: Family Medicine

## 2019-09-05 VITALS — BP 138/70 | HR 80 | Temp 97.7°F | Resp 18 | Ht 62.0 in | Wt 232.4 lb

## 2019-09-05 DIAGNOSIS — I5082 Biventricular heart failure: Secondary | ICD-10-CM | POA: Diagnosis not present

## 2019-09-05 DIAGNOSIS — I10 Essential (primary) hypertension: Secondary | ICD-10-CM | POA: Diagnosis not present

## 2019-09-05 DIAGNOSIS — N183 Chronic kidney disease, stage 3 unspecified: Secondary | ICD-10-CM

## 2019-09-05 DIAGNOSIS — I272 Pulmonary hypertension, unspecified: Secondary | ICD-10-CM | POA: Diagnosis not present

## 2019-09-05 DIAGNOSIS — E1122 Type 2 diabetes mellitus with diabetic chronic kidney disease: Secondary | ICD-10-CM

## 2019-09-05 DIAGNOSIS — I2781 Cor pulmonale (chronic): Secondary | ICD-10-CM | POA: Diagnosis not present

## 2019-09-05 DIAGNOSIS — N179 Acute kidney failure, unspecified: Secondary | ICD-10-CM | POA: Diagnosis not present

## 2019-09-05 DIAGNOSIS — I110 Hypertensive heart disease with heart failure: Secondary | ICD-10-CM | POA: Diagnosis not present

## 2019-09-05 DIAGNOSIS — F17210 Nicotine dependence, cigarettes, uncomplicated: Secondary | ICD-10-CM | POA: Diagnosis not present

## 2019-09-05 DIAGNOSIS — Z794 Long term (current) use of insulin: Secondary | ICD-10-CM | POA: Diagnosis not present

## 2019-09-05 DIAGNOSIS — M109 Gout, unspecified: Secondary | ICD-10-CM | POA: Diagnosis not present

## 2019-09-05 DIAGNOSIS — I5043 Acute on chronic combined systolic (congestive) and diastolic (congestive) heart failure: Secondary | ICD-10-CM | POA: Diagnosis not present

## 2019-09-05 IMAGING — CR DG CHEST 2V
2 series · 2 of 2 positions shown · non-contrast
Comparison: 06/18/2017

CLINICAL DATA: Pt c/o SVT, cough, and congestion x 1 day. Hx of
asthma, CHF, COPD, AND DM. Pt is a current smoker.

EXAM:
CHEST - 2 VIEW

[chest lat]
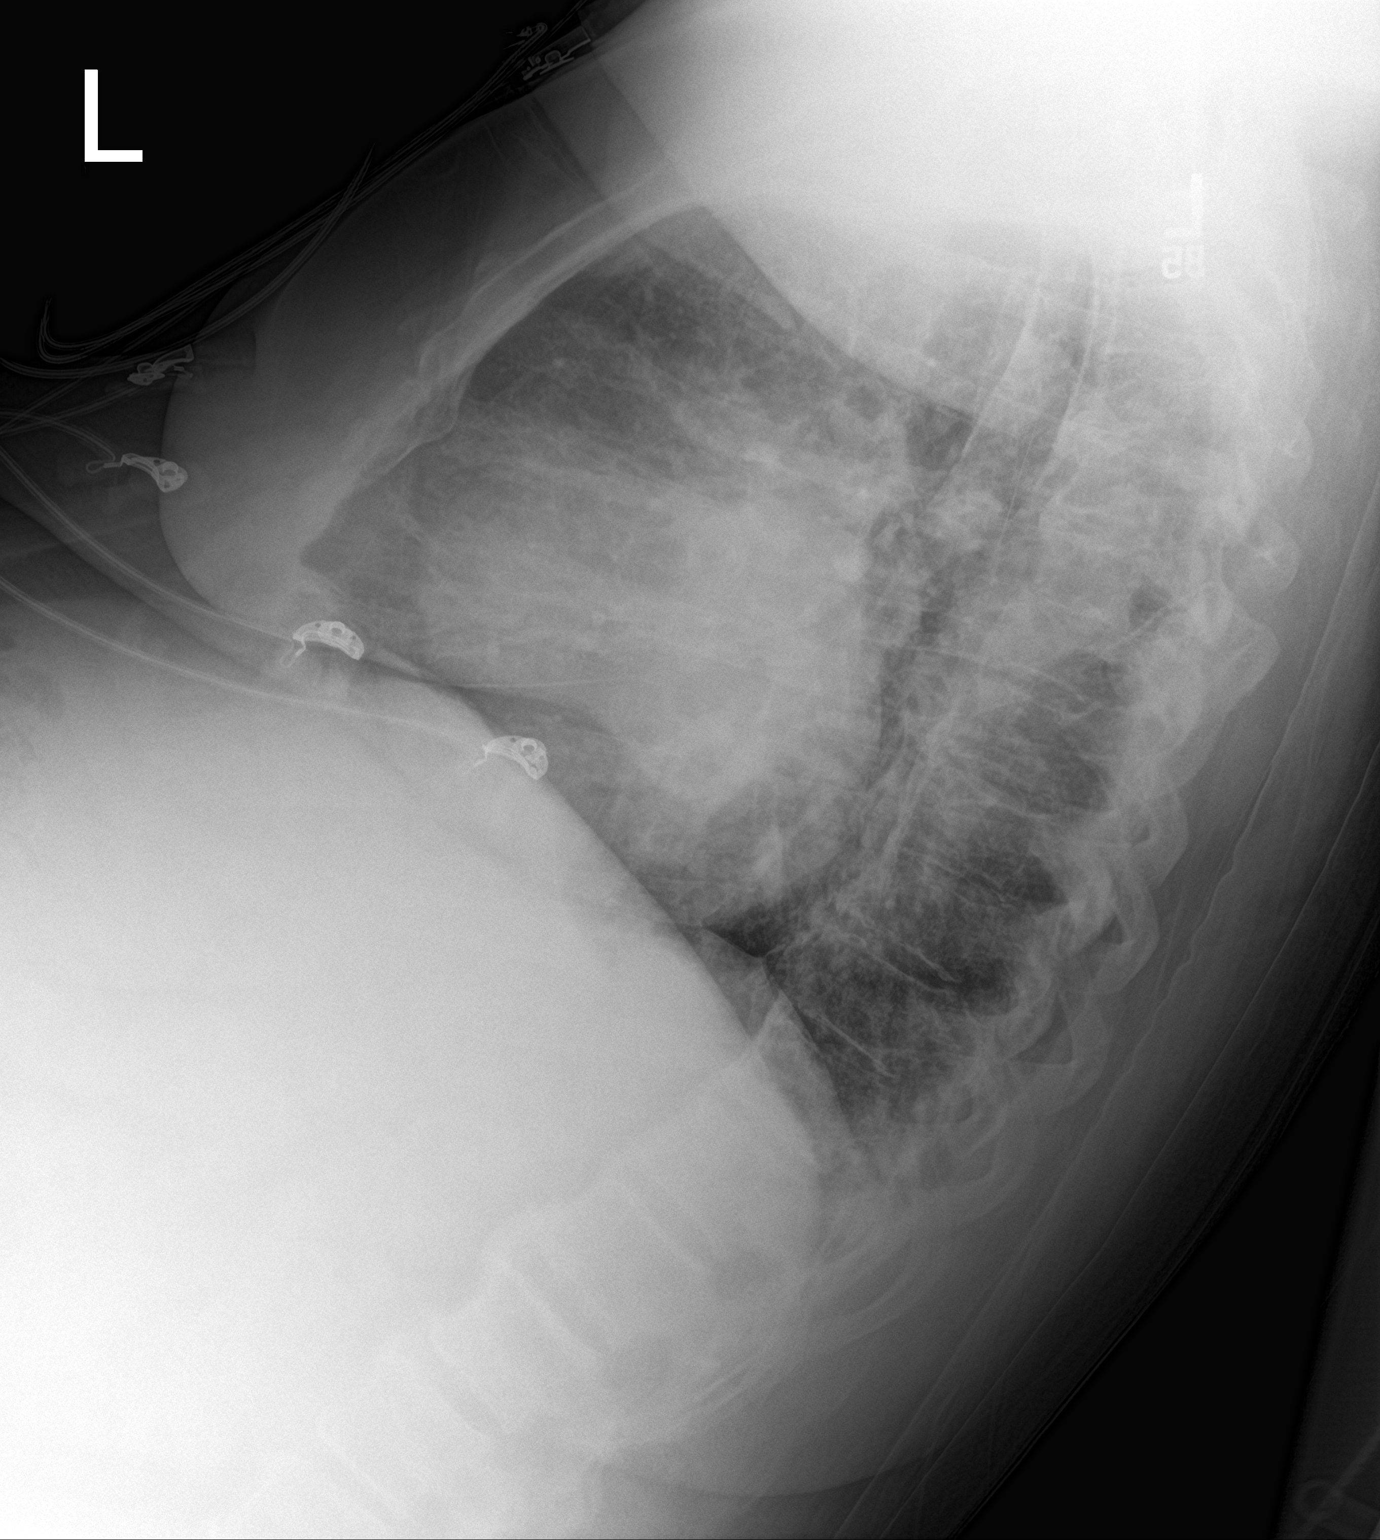

[chest ap]
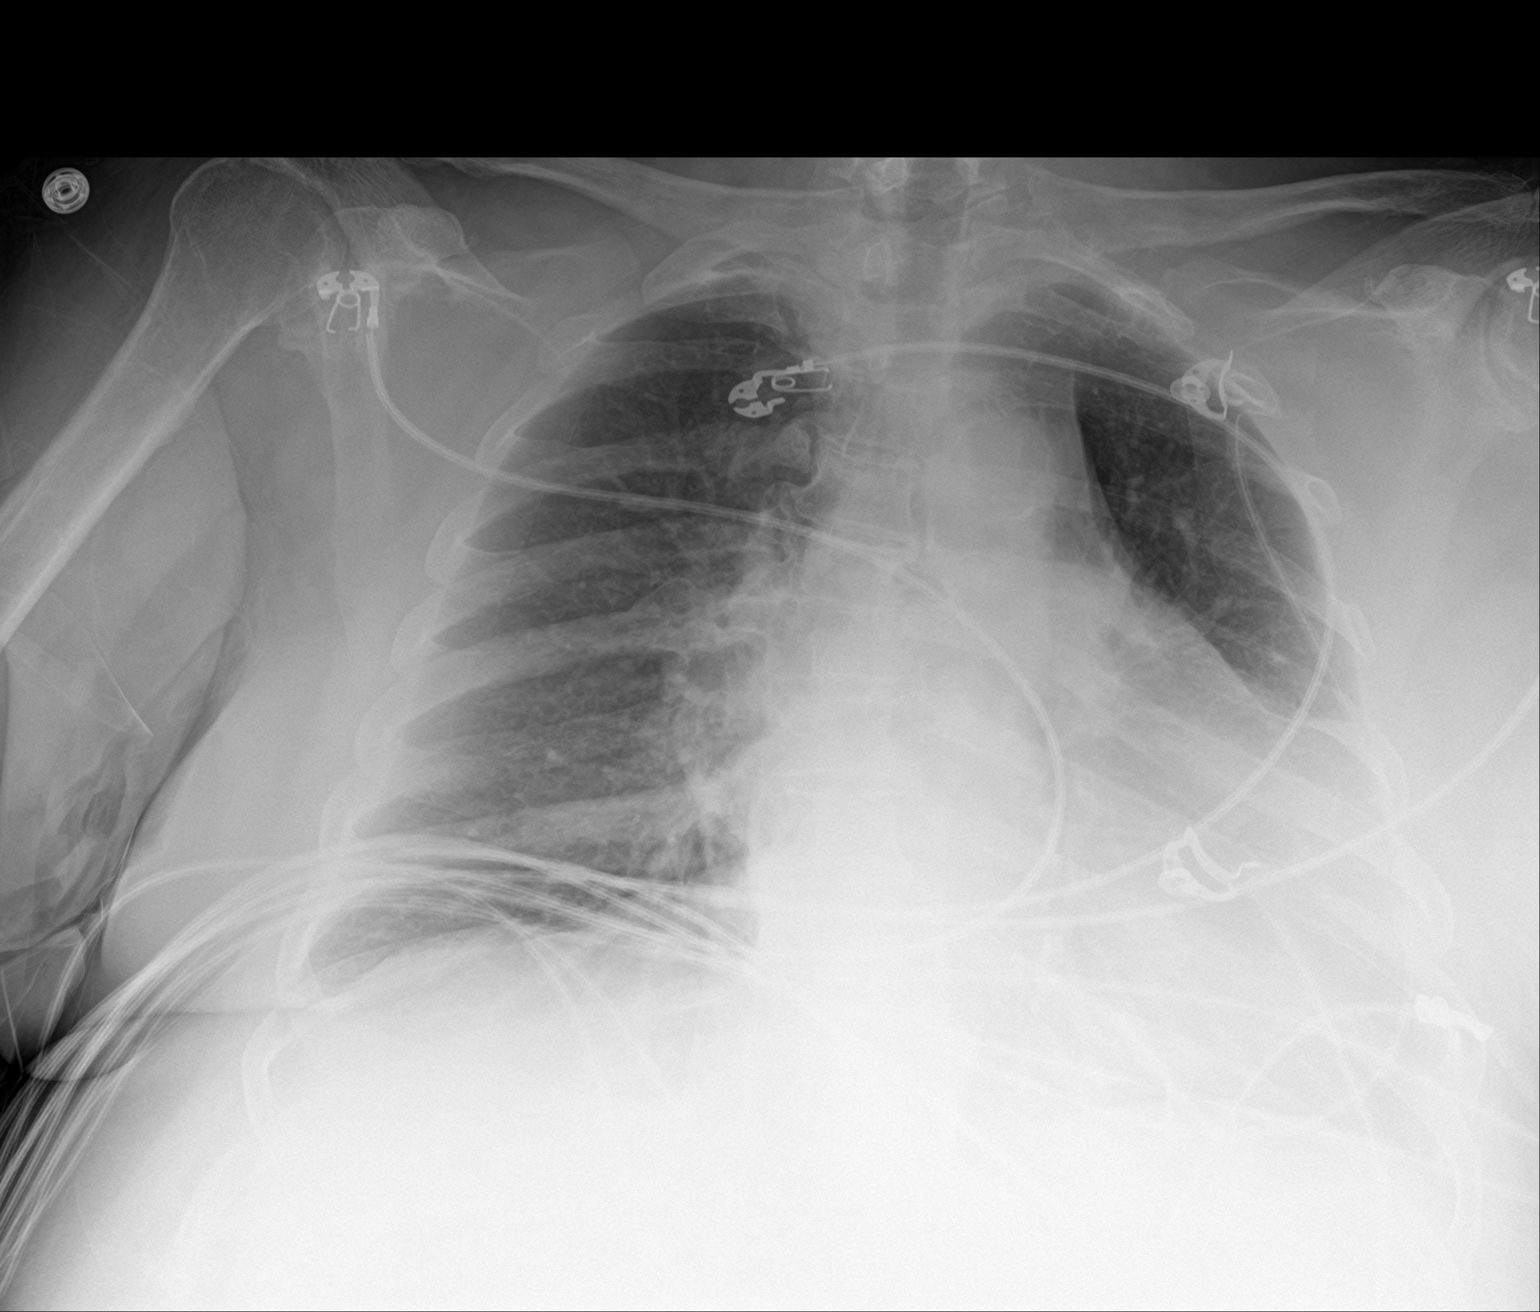

[2 of 2 positions shown; findings below may reference images not displayed]

FINDINGS: Cardiac silhouette is mildly enlarged. No mediastinal or hilar
masses. No convincing adenopathy.

Lungs are clear.

No pleural effusion or pneumothorax.

Skeletal structures are intact.
IMPRESSION: No acute cardiopulmonary disease.

## 2019-09-05 NOTE — Patient Instructions (Signed)
Great to see you and glad you are feeling better! Continue current medications and stay quit from smoking I will see you next month

## 2019-09-06 ENCOUNTER — Telehealth: Payer: Self-pay

## 2019-09-06 DIAGNOSIS — N179 Acute kidney failure, unspecified: Secondary | ICD-10-CM | POA: Diagnosis not present

## 2019-09-06 DIAGNOSIS — I5082 Biventricular heart failure: Secondary | ICD-10-CM | POA: Diagnosis not present

## 2019-09-06 DIAGNOSIS — I5043 Acute on chronic combined systolic (congestive) and diastolic (congestive) heart failure: Secondary | ICD-10-CM | POA: Diagnosis not present

## 2019-09-06 DIAGNOSIS — I272 Pulmonary hypertension, unspecified: Secondary | ICD-10-CM | POA: Diagnosis not present

## 2019-09-06 DIAGNOSIS — I110 Hypertensive heart disease with heart failure: Secondary | ICD-10-CM | POA: Diagnosis not present

## 2019-09-06 DIAGNOSIS — I2781 Cor pulmonale (chronic): Secondary | ICD-10-CM | POA: Diagnosis not present

## 2019-09-06 MED ORDER — COLCHICINE 0.6 MG PO TABS: ORAL_TABLET | ORAL | 0 refills | Status: DC

## 2019-09-06 NOTE — Telephone Encounter (Signed)
Copied from [place] [phone]. Topic: General - Other >> Sep 05, 2019  3:54 PM Mathis Bud wrote: Reason for CRM: Patient had an appt with PCP today.  Patient states that she dicussed her gout and PCP was going to call her in something and she believes PCP forgot. Call back [phone]

## 2019-09-06 NOTE — Telephone Encounter (Signed)
Colchicine sent in.

## 2019-09-07 ENCOUNTER — Encounter: Payer: Self-pay | Admitting: Family Medicine

## 2019-09-07 DIAGNOSIS — M109 Gout, unspecified: Secondary | ICD-10-CM | POA: Insufficient documentation

## 2019-09-07 DIAGNOSIS — I5082 Biventricular heart failure: Secondary | ICD-10-CM | POA: Insufficient documentation

## 2019-09-07 NOTE — Assessment & Plan Note (Signed)
-  BP is well controlled today, continue current medications.  -Recommend she continue to follow low salt diet.

## 2019-09-07 NOTE — Assessment & Plan Note (Signed)
-  Congratulated on stopping smoking since discharge.  -Continue chantix, tolerating well at this time.

## 2019-09-07 NOTE — Assessment & Plan Note (Signed)
-  Likely precipitated by increased diuresis -Will avoid NSAIDS given renal dysfunction and steroid given diabetes and fluid retention.  Treat with colchicine 1.2mg  initially followed by 0.6mg  1 hour later.

## 2019-09-07 NOTE — Progress Notes (Signed)
[name] - 63 y.o. female MRN [id number]  Date of birth: October 28, 1956  Subjective Chief Complaint  Patient presents with  . Hospitalization Follow-up    Pt states she is doing better    HPI [name] is a 63 y.o. female with history of HTN, T2DM, HLD, cerebrovascular disease, history of CSF leak with skull base repair,  and pulmonary HTN with R sided CHF here today for hospital follow up.  She was admitted to the [facility] hospital from 08/16/2019-08/26/2019 due to worsening dyspnea and fluid retention.  Upon admission she was diuresed however had increase in serum creatinine with worsening respiratory status and fluid retention.  Repeat echo at admission with LVEF of 30-35% and diffuse hypokinesis with severely reduced RV systolic function.  Swan-ganz catheter placed which confirming biventricular failure.  Cardiology was consulted and she was transferred to ICU for continued swan cath monitoring.  She was placed on milrinone and lasix drips with good diuresis and improvement in renal function.  She was subsequently discharged on torsemide 58m daily, hydralazine 25 mg TID, imdur 349mdaily and losartan 12.62m70m She does have history of OSA and need repeat sleep study.   She did previously have a CPAP machine however had to return due to poor compliance.  She admits that she has not been truthful about medication compliance and use of cpap as she had told me previously that she was still using this.   Since d/c from hospital she has followed up with cardiology and home sleep study has been ordered.  She has quit smoking since discharge and continues on chantix.  She is determined to stay quit this time.  She reports that she is feeling much better since discharge and quitting smoking.    Her blood sugars in the hospital were much better controlled following a diabetic diet.  She has tried to follow a healthier diet since discharge.  Her insulin dose was decreased to 60 units of lantus daily due  to hypoglycemia and SSI was added.  Blood sugars 120-150 per patient.    She does have complaint of foot and ankle pain, R>L similar to previous gout flares.    ROS:  A comprehensive ROS was completed and negative except as noted per HPI  Allergies  Allergen Reactions  . Atorvastatin Other (See Comments) and Nausea And Vomiting    "legs cramped; moderate to almost severe" "legs cramped; moderate to almost severe"  . Ibuprofen Other (See Comments)    Other reaction(s): Other (See Comments) Per pt, MD doesn't want pt to take Per pt, MD doesn't want pt to take  . Metformin And Related Nausea And Vomiting  . Ace Inhibitors Cough and Nausea And Vomiting  . Hydrocodone-Acetaminophen Nausea And Vomiting  . Albuterol Other (See Comments)    Caused SVT    Past Medical History:  Diagnosis Date  . Anxiety    doesn't take any meds for this  . Arteriosclerosis of coronary artery 08/31/2011   Myoview 9/12: EF 44, no ischemia // Myoview 8/18: EF 57, low risk, possible small area of inferior ischemia // Minor nonobstructive CAD by cardiac catheterization - LHC 8/18: Proximal LAD 35, OM2 25  . Arthritis    knuckles  . Asthma   . Carotid artery disease ([place]  Carotid US [place]: Bilateral ICA 40-59 // Carotid US [place]) 2/17: bilat plaque without sig stenosis  . Chronic diastolic CHF (congestive heart failure) ([place]/19/[place]   Echo 7/18:Severe conc LVH, EF  60-65, no RWMA, Gr 2 DD, MAC, mild MR // right and left heart cath 8/18: LVEDP 21  . Claustrophobia 07/26/2012  . COPD (chronic obstructive pulmonary disease) ([place])    "chronic bronchitis - about every winter"  . CSF leak    dizziness/headache - assoc symptoms // 2/2 encephalocele s/p skull base reconstruction  . Diverticulosis   . History of CVA (cerebrovascular accident) 03/2004   denies residual 07/26/2012  . Hx of gout   . Hyperlipidemia    takes Pravastatin daily  . Hypertensive heart disease with chronic diastolic congestive heart  failure ([place]) 07/26/2017  . Internal hemorrhoids   . Kidney stones   . LVH (left ventricular hypertrophy)    Echo 8/18: Severe conc LVH, EF 60-65, no RWMA, Gr 2 DD, MAC, mild MR  . OSA (obstructive sleep apnea) 07/26/2012   prior CPAP - stopped due to being primary caregiver for dying parent (stopped in 2016)  . PSVT (paroxysmal supraventricular tachycardia) ([place])   . Pulmonary hypertension, unspecified ([place]) 08/14/2017   cath 07/2017 >>PASP 59m Hg, PA mean 39 mm Hg  . Type II diabetes mellitus ([place]     Past Surgical History:  Procedure Laterality Date  . ESOPHAGOGASTRODUODENOSCOPY (EGD) WITH PROPOFOL N/A 07/13/2015   Procedure: ESOPHAGOGASTRODUODENOSCOPY (EGD) WITH PROPOFOL;  Surgeon: PCarol Ada MD;  Location: [facility];  Service: Endoscopy;  Laterality: N/A;  . NASAL SINUS SURGERY  01/29/2012   Procedure: ENDOSCOPIC SINUS SURGERY WITH STEALTH;  Surgeon: MRuby Cola MD;  [place] [facility]  Service: ENT;  Laterality: N/A;  . NM MYOVIEW LTD  08/25/2011   Low risk study, no evidence of ischemia, EF 44%  . REFRACTIVE SURGERY  ~ 2010   left  . RIGHT/LEFT HEART CATH AND CORONARY ANGIOGRAPHY N/A 07/30/2017   Procedure: RIGHT/LEFT HEART CATH AND CORONARY ANGIOGRAPHY;  Surgeon: JMartinique Peter M, MD;  [facility];  Service: Cardiovascular;  Laterality: N/A;  . SPHENOIDECTOMY  01/29/2012   Procedure: SPHENOIDECTOMY;  Surgeon: MRuby Cola MD;  [facility]  Service: ENT;  Laterality: Left;  REPAIR OF LEFT SPHENOID    . TOTAL ABDOMINAL HYSTERECTOMY  1994    Social History   Socioeconomic History  . Marital status: Single    Spouse name: Not on file  . Number of children: 0  . Years of education: Not on file  . Highest education level: Not on file  Occupational History  . Occupation: disabled  Social Needs  . Financial resource strain: Not on file  . Food insecurity    Worry: Not on file    Inability: Not on file  . Transportation needs    Medical: Not on  file    Non-medical: Not on file  Tobacco Use  . Smoking status: Former Smoker    Packs/day: 0.50    Years: 35.00    Pack years: 17.50    Types: Cigarettes  . Smokeless tobacco: Never Used  . Tobacco comment: 07/26/2012 has decreased smoking from 2ppd; offered cessation materials; pt declines  Substance and Sexual Activity  . Alcohol use: Yes    Alcohol/week: 1.0 standard drinks    Types: 1 Glasses of wine per week    Comment: rarely  . Drug use: No  . Sexual activity: Never    Birth control/protection: Surgical  Lifestyle  . Physical activity    Days per week: Not on file    Minutes per session: Not on file  . Stress: Not on file  Relationships  .  Social Herbalist on phone: Not on file    Gets together: Not on file    Attends religious service: Not on file    Active member of club or organization: Not on file    Attends meetings of clubs or organizations: Not on file    Relationship status: Not on file  Other Topics Concern  . Not on file  Social History Narrative   Currently unemployed.   No children, not married    Family History  Problem Relation Age of Onset  . Colon cancer Sister 25  . Breast cancer Mother 44  . Alzheimer's disease Mother   . Diabetes Sister 78  . Heart failure Sister   . Diabetes Sister 4  . Lung cancer Father   . Breast cancer Sister   . Cancer Sister        mouth  . Anesthesia problems Neg Hx   . Hypotension Neg Hx   . Malignant hyperthermia Neg Hx   . Pseudochol deficiency Neg Hx     Health Maintenance  Topic Date Due  . PAP SMEAR-Modifier  04/26/1978  . OPHTHALMOLOGY EXAM  03/04/2014  . FOOT EXAM  06/20/2015  . MAMMOGRAM  06/25/2018  . INFLUENZA VACCINE  07/09/2019  . COLONOSCOPY  10/09/2019  . HEMOGLOBIN A1C  11/22/2019  . TETANUS/TDAP  03/29/2025  . PNEUMOCOCCAL POLYSACCHARIDE VACCINE AGE 92-64 HIGH RISK  Completed  . Hepatitis C Screening  Completed  . HIV Screening  Completed     ----------------------------------------------------------------------------------------------------------------------------------------------------------------------------------------------------------------- Physical Exam BP 138/70 (BP Location: Right Arm)   Pulse 80   Temp 97.7 F (36.5 C) (Oral)   Resp 18   Ht _0  (1.575 m)   Wt 232 lb 6.4 oz (105.4 kg)   SpO2 95%   BMI 42.51 kg/m   Physical Exam Constitutional:      Appearance: Normal appearance.  HENT:     Head: Normocephalic and atraumatic.     Mouth/Throat:     Mouth: Mucous membranes are moist.  Eyes:     General: No scleral icterus. Neck:     Musculoskeletal: Neck supple.  Cardiovascular:     Rate and Rhythm: Normal rate and regular rhythm.  Pulmonary:     Effort: Pulmonary effort is normal.     Breath sounds: Normal breath sounds.  Abdominal:     General: There is no distension.     Palpations: Abdomen is soft.     Tenderness: There is no abdominal tenderness.  Musculoskeletal:     Right lower leg: No edema.     Left lower leg: No edema.     Comments: R ankle with erythema and tenderness, mild warmth.   Skin:    General: Skin is warm and dry.  Neurological:     General: No focal deficit present.     Mental Status: She is alert and oriented to person, place, and time.  Psychiatric:        Mood and Affect: Mood normal.        Behavior: Behavior normal.     ------------------------------------------------------------------------------------------------------------------------------------------------------------------------------------------------------------------- Assessment and Plan  Essential hypertension -BP is well controlled today, continue current medications.  -Recommend she continue to follow low salt diet.   Nicotine dependence, cigarettes, uncomplicated -Congratulated on stopping smoking since discharge.  -Continue chantix, tolerating well at this time.   Type 2 diabetes mellitus with  stage 3 chronic kidney disease, with long-term current use of insulin ([place]) -Diabetes has been much better controlled with  dietary changes.   -Recommend that she check glucose levels at home and review at f/u in 1 month with adjusted dose of lantus and SSI.    Biventricular heart failure ([place]) -Followed by cardiology -Recent renal function stable.  -She continues on torsemide for diuresis -BP well controlled at this time.  -Needs updated sleep study, home sleep study ordered by cardiology.  I asked her to let me know if she doesn't get equipment to complete this within the next week or so.     Acute gout -Likely precipitated by increased diuresis -Will avoid NSAIDS given renal dysfunction and steroid given diabetes and fluid retention.  Treat with colchicine 1.71m initially followed by 0.687m1 hour later.

## 2019-09-07 NOTE — Assessment & Plan Note (Signed)
-  Followed by cardiology -Recent renal function stable.  -She continues on torsemide for diuresis -BP well controlled at this time.  -Needs updated sleep study, home sleep study ordered by cardiology.  I asked her to let me know if she doesn't get equipment to complete this within the next week or so.

## 2019-09-07 NOTE — Assessment & Plan Note (Signed)
-  Diabetes has been much better controlled with dietary changes.   -Recommend that she check glucose levels at home and review at f/u in 1 month with adjusted dose of lantus and SSI.

## 2019-09-08 DIAGNOSIS — I272 Pulmonary hypertension, unspecified: Secondary | ICD-10-CM | POA: Diagnosis not present

## 2019-09-08 DIAGNOSIS — I5082 Biventricular heart failure: Secondary | ICD-10-CM | POA: Diagnosis not present

## 2019-09-08 DIAGNOSIS — I110 Hypertensive heart disease with heart failure: Secondary | ICD-10-CM | POA: Diagnosis not present

## 2019-09-08 DIAGNOSIS — N179 Acute kidney failure, unspecified: Secondary | ICD-10-CM | POA: Diagnosis not present

## 2019-09-08 DIAGNOSIS — I2781 Cor pulmonale (chronic): Secondary | ICD-10-CM | POA: Diagnosis not present

## 2019-09-08 DIAGNOSIS — I5043 Acute on chronic combined systolic (congestive) and diastolic (congestive) heart failure: Secondary | ICD-10-CM | POA: Diagnosis not present

## 2019-09-16 ENCOUNTER — Telehealth: Payer: Self-pay

## 2019-09-16 NOTE — Telephone Encounter (Signed)
Do you know something about this? I saw your last note where cardiology ordered the home sleep study.     Copied from [place] [id number]. Topic: General - Other >> Sep 16, 2019 10:06 AM Pauline Good wrote: Reason for CRM: pt stated she haven't received her cpap machine tester and want to speak to nurse about it

## 2019-09-18 NOTE — Telephone Encounter (Signed)
Per cardiology note it appeared they were working on setting this up but I asked her to let me know if she didn't receive the equipment.  I will message her pulmonologist, Dr. Valeta Harms to see if this is something they can assist in getting set up for her.

## 2019-09-19 ENCOUNTER — Telehealth: Payer: Self-pay

## 2019-09-19 NOTE — Telephone Encounter (Signed)
LMTCB   Please schedule patient a consult visit with VS or AO.

## 2019-09-19 NOTE — Telephone Encounter (Signed)
Appt scheduled 10/21 with AO. Nothing further needed at this time.

## 2019-09-19 NOTE — Telephone Encounter (Signed)
-----   Message from Garner Nash, DO sent at 09/18/2019  9:09 PM EDT ----- Regarding: FW: Sleep study [place], Please get her setup next available sleep evaluation with wale or vineet.  She needs a repeat study. I think she has had one in the past.  Thanks Leory Plowman ----- Message ----- From: Luetta Nutting, DO Sent: 09/18/2019   9:02 PM EDT To: Garner Nash, DO Subject: Sleep study                                    Dr. Valeta Harms,  Ms. Amedee needs to have repeat sleep study.  I would really appreciate if you could help [place] with getting this set up for her?    Thanks,   [employer]

## 2019-09-19 NOTE — Telephone Encounter (Signed)
Pt has appt with pulmonology.

## 2019-09-20 DIAGNOSIS — I5043 Acute on chronic combined systolic (congestive) and diastolic (congestive) heart failure: Secondary | ICD-10-CM | POA: Diagnosis not present

## 2019-09-20 DIAGNOSIS — I110 Hypertensive heart disease with heart failure: Secondary | ICD-10-CM | POA: Diagnosis not present

## 2019-09-20 DIAGNOSIS — I272 Pulmonary hypertension, unspecified: Secondary | ICD-10-CM | POA: Diagnosis not present

## 2019-09-20 DIAGNOSIS — I5082 Biventricular heart failure: Secondary | ICD-10-CM | POA: Diagnosis not present

## 2019-09-20 DIAGNOSIS — I2781 Cor pulmonale (chronic): Secondary | ICD-10-CM | POA: Diagnosis not present

## 2019-09-20 DIAGNOSIS — N179 Acute kidney failure, unspecified: Secondary | ICD-10-CM | POA: Diagnosis not present

## 2019-09-22 ENCOUNTER — Other Ambulatory Visit: Payer: Self-pay

## 2019-09-22 ENCOUNTER — Encounter (HOSPITAL_COMMUNITY): Payer: Self-pay

## 2019-09-22 ENCOUNTER — Ambulatory Visit (HOSPITAL_COMMUNITY)
Admission: RE | Admit: 2019-09-22 | Discharge: 2019-09-22 | Disposition: A | Discharge status: Home / Self Care | Payer: Medicare Other | Source: Ambulatory Visit | Attending: Adult Health | Admitting: Adult Health

## 2019-09-22 VITALS — BP 146/88 | HR 81 | Wt 228.2 lb

## 2019-09-22 DIAGNOSIS — Z8673 Personal history of transient ischemic attack (TIA), and cerebral infarction without residual deficits: Secondary | ICD-10-CM | POA: Diagnosis not present

## 2019-09-22 DIAGNOSIS — J449 Chronic obstructive pulmonary disease, unspecified: Secondary | ICD-10-CM | POA: Diagnosis not present

## 2019-09-22 DIAGNOSIS — I2510 Atherosclerotic heart disease of native coronary artery without angina pectoris: Secondary | ICD-10-CM | POA: Diagnosis not present

## 2019-09-22 DIAGNOSIS — G4733 Obstructive sleep apnea (adult) (pediatric): Secondary | ICD-10-CM | POA: Diagnosis not present

## 2019-09-22 DIAGNOSIS — F419 Anxiety disorder, unspecified: Secondary | ICD-10-CM | POA: Insufficient documentation

## 2019-09-22 DIAGNOSIS — Z794 Long term (current) use of insulin: Secondary | ICD-10-CM | POA: Insufficient documentation

## 2019-09-22 DIAGNOSIS — I272 Pulmonary hypertension, unspecified: Secondary | ICD-10-CM | POA: Insufficient documentation

## 2019-09-22 DIAGNOSIS — I5082 Biventricular heart failure: Secondary | ICD-10-CM | POA: Diagnosis not present

## 2019-09-22 DIAGNOSIS — I130 Hypertensive heart and chronic kidney disease with heart failure and stage 1 through stage 4 chronic kidney disease, or unspecified chronic kidney disease: Secondary | ICD-10-CM | POA: Insufficient documentation

## 2019-09-22 DIAGNOSIS — Z79899 Other long term (current) drug therapy: Secondary | ICD-10-CM | POA: Insufficient documentation

## 2019-09-22 DIAGNOSIS — E785 Hyperlipidemia, unspecified: Secondary | ICD-10-CM | POA: Diagnosis not present

## 2019-09-22 DIAGNOSIS — F17210 Nicotine dependence, cigarettes, uncomplicated: Secondary | ICD-10-CM | POA: Diagnosis not present

## 2019-09-22 DIAGNOSIS — N189 Chronic kidney disease, unspecified: Secondary | ICD-10-CM | POA: Insufficient documentation

## 2019-09-22 DIAGNOSIS — I428 Other cardiomyopathies: Secondary | ICD-10-CM | POA: Insufficient documentation

## 2019-09-22 DIAGNOSIS — Z7982 Long term (current) use of aspirin: Secondary | ICD-10-CM | POA: Insufficient documentation

## 2019-09-22 DIAGNOSIS — I5022 Chronic systolic (congestive) heart failure: Secondary | ICD-10-CM

## 2019-09-22 DIAGNOSIS — E1122 Type 2 diabetes mellitus with diabetic chronic kidney disease: Secondary | ICD-10-CM | POA: Insufficient documentation

## 2019-09-22 DIAGNOSIS — I5042 Chronic combined systolic (congestive) and diastolic (congestive) heart failure: Secondary | ICD-10-CM | POA: Diagnosis not present

## 2019-09-22 DIAGNOSIS — J9611 Chronic respiratory failure with hypoxia: Secondary | ICD-10-CM | POA: Diagnosis not present

## 2019-09-22 DIAGNOSIS — Z6841 Body Mass Index (BMI) 40.0 and over, adult: Secondary | ICD-10-CM | POA: Diagnosis not present

## 2019-09-22 MED ORDER — HYDRALAZINE HCL 50 MG PO TABS: 50.0000 mg | ORAL_TABLET | Freq: Three times a day (TID) | ORAL | 3 refills | Status: DC

## 2019-09-22 NOTE — Progress Notes (Addendum)
PCP: Primary Cardiologist: Dr Harrington Challenger HF MD; Dr Haroldine Laws  HPI: Angel Zillmer Sprinkleis a 63 y.o.femalewith a hx of morbid obesity, chronic systolic/diastolic CHF, PSVT, prior CVA, HTN, DM, OSA, CKD with baseline creatinine 1.3-1.4, COPD with ongoing tobacco, pulmonary hypertensionand CSF rhinorrhea s/p skull base reconstruction.   Admitted with A/C diastolic heart failure. > 30 pound weight gain in a few weeks after her  PCP discontinued lasix  06/27/2019 secondary to worsening kidney function. In ER markedly volume overloaded. [name] was placed to guide therapy with initial swan numbers showing cardiac index 1.6 and elevated filling pressures. ECHO completed and showed biventricular HF with LVEF 25-30 and RV severely reduced. Myeloma panel- no M spike and PYP scan negative for amyloid. She was placed on milrinone and diuresed with IV lasix. As she improved, milrinone was weaned off. HF medications adjusted. Discharge weight was 236 pounds.   Had post hospital f/u on 9/24. Overall was feeling fine.  Mild dyspnea with exertion. Denieed PND/Orthopnea. No syncope.  Mild dizziness with standing. Appetite ok. No fever or chills. Weight at home 230 pounds.  BMP showed stable SCr at 1.7. K 4.1.   She presents to clinic today for med titration. Intent is to repeat echocardiogram 3 months after maximum med titration. Current regimen:   hydralazine to 25 mg three times a day + 30 mg Imdur. 12.5 mg losartan daily. We have been hesitant to push ARB dose due to renal function.   She reports that she is doing great. Feels good. Exercising more. Walking at the school track. No exertional dyspnea w/ ADLs but has mild dyspnea w/ moderate activity. Weight stable. No LEE. Compliant w/ meds. Restricting salt in diet. Has quit smoking. She has an appt w/ sleep specialist next week in pulmonology clinic for sleep study evaluation.    Cardiac Testing Echo 08/18/19 EF 25-30% Mod LVH. RV dilated and severely HK. Personally  reviewed  RHC/LHC 07/31/2019  Cardiac catheterization demonstrated minor nonobstructive CAD, mildly elevated LVEDP and moderate pulmonary hypertension. LAD 35% OM2 25% RA 10 PA 57/22 (39) PCWP 19 LVEDP 21 Fick 3.7/1.7   ROS: All systems negative except as listed in HPI, PMH and Problem List.  SH:  Social History   Socioeconomic History  . Marital status: Single    Spouse name: Not on file  . Number of children: 0  . Years of education: Not on file  . Highest education level: Not on file  Occupational History  . Occupation: disabled  Social Needs  . Financial resource strain: Not on file  . Food insecurity    Worry: Not on file    Inability: Not on file  . Transportation needs    Medical: Not on file    Non-medical: Not on file  Tobacco Use  . Smoking status: Former Smoker    Packs/day: 0.50    Years: 35.00    Pack years: 17.50    Types: Cigarettes  . Smokeless tobacco: Never Used  . Tobacco comment: 07/26/2012 has decreased smoking from 2ppd; offered cessation materials; pt declines  Substance and Sexual Activity  . Alcohol use: Yes    Alcohol/week: 1.0 standard drinks    Types: 1 Glasses of wine per week    Comment: rarely  . Drug use: No  . Sexual activity: Never    Birth control/protection: Surgical  Lifestyle  . Physical activity    Days per week: Not on file    Minutes per session: Not on file  . Stress: Not  on file  Relationships  . Social Herbalist on phone: Not on file    Gets together: Not on file    Attends religious service: Not on file    Active member of club or organization: Not on file    Attends meetings of clubs or organizations: Not on file    Relationship status: Not on file  . Intimate partner violence    Fear of current or ex partner: Not on file    Emotionally abused: Not on file    Physically abused: Not on file    Forced sexual activity: Not on file  Other Topics Concern  . Not on file  Social History Narrative    Currently unemployed.   No children, not married    FH:  Family History  Problem Relation Age of Onset  . Colon cancer Sister 77  . Breast cancer Mother 50  . Alzheimer's disease Mother   . Diabetes Sister 76  . Heart failure Sister   . Diabetes Sister 54  . Lung cancer Father   . Breast cancer Sister   . Cancer Sister        mouth  . Anesthesia problems Neg Hx   . Hypotension Neg Hx   . Malignant hyperthermia Neg Hx   . Pseudochol deficiency Neg Hx     Past Medical History:  Diagnosis Date  . Anxiety    doesn't take any meds for this  . Arteriosclerosis of coronary artery 08/31/2011   Myoview 9/12: EF 44, no ischemia // Myoview 8/18: EF 57, low risk, possible small area of inferior ischemia // Minor nonobstructive CAD by cardiac catheterization - LHC 8/18: Proximal LAD 35, OM2 25  . Arthritis    knuckles  . Asthma   . Carotid artery disease ([place])    Carotid US 7/14: Bilateral ICA 40-59 // Carotid US ([facility]) 2/17: bilat plaque without sig stenosis  . Chronic diastolic CHF (congestive heart failure) ([place]) 07/26/2017   Echo 7/18:Severe conc LVH, EF 60-65, no RWMA, Gr 2 DD, MAC, mild MR // right and left heart cath 8/18: LVEDP 21  . Claustrophobia 07/26/2012  . COPD (chronic obstructive pulmonary disease) ([place])    "chronic bronchitis - about every winter"  . CSF leak    dizziness/headache - assoc symptoms // 2/2 encephalocele s/p skull base reconstruction  . Diverticulosis   . History of CVA (cerebrovascular accident) 03/2004   denies residual 07/26/2012  . Hx of gout   . Hyperlipidemia    takes Pravastatin daily  . Hypertensive heart disease with chronic diastolic congestive heart failure ([place]) 07/26/2017  . Internal hemorrhoids   . Kidney stones   . LVH (left ventricular hypertrophy)    Echo 8/18: Severe conc LVH, EF 60-65, no RWMA, Gr 2 DD, MAC, mild MR  . OSA (obstructive sleep apnea) 07/26/2012   prior CPAP - stopped due to being primary caregiver for dying parent  (stopped in 2016)  . PSVT (paroxysmal supraventricular tachycardia) ([place])   . Pulmonary hypertension, unspecified ([place]) 08/14/2017   cath 07/2017 >>PASP 17m Hg, PA mean 39 mm Hg  . Type II diabetes mellitus ([place]     Current Outpatient Medications  Medication Sig Dispense Refill  . acetaminophen (TYLENOL) 325 MG tablet Take 2 tablets (650 mg total) by mouth every 6 (six) hours as needed for mild pain. 90 tablet 2  . albuterol (VENTOLIN HFA) 108 (90 Base) MCG/ACT inhaler Inhale 2 puffs into the lungs every 6 (six)  hours as needed for wheezing or shortness of breath. 1 Inhaler 0  . allopurinol (ZYLOPRIM) 100 MG tablet Take 100 mg by mouth daily.     Marland Kitchen aspirin EC 81 MG tablet Take 1 tablet (81 mg total) by mouth daily. 90 tablet 3  . Blood Glucose Monitoring Suppl (ACCU-CHEK AVIVA PLUS) w/Device KIT Patient is to test four times daily. Dx. E11.42 1 kit 1  . busPIRone (BUSPAR) 15 MG tablet Take 15 mg by mouth 2 (two) times daily.     . colchicine 0.6 MG tablet Take 1.80m initially followed by 0.658m1 hour later.  Continue 0.8m44maily until gout is resolving. 15 tablet 0  . FLUoxetine (PROZAC) 20 MG capsule TAKE 1 CAPSULE (20 MG TOTAL) BY MOUTH AT BEDTIME. 90 capsule 2  . gabapentin (NEURONTIN) 400 MG capsule Take 1 capsule (400 mg total) by mouth at bedtime.    . gMarland Kitchenucose blood (ONETOUCH VERIO) test strip For E11.22 100 strip 12  . hydrALAZINE (APRESOLINE) 50 MG tablet Take 1 tablet (50 mg total) by mouth every 8 (eight) hours. 270 tablet 3  . insulin aspart (NOVOLOG FLEXPEN) 100 UNIT/ML FlexPen Inject 5 Units into the skin 3 (three) times daily with meals.    . Insulin Glargine 300 UNIT/ML SOPN Inject 60 Units into the skin daily.    . isosorbide mononitrate (IMDUR) 30 MG 24 hr tablet Take 1 tablet (30 mg total) by mouth daily. ** DO NOT CRUSH ** 30 tablet 0  . losartan (COZAAR) 25 MG tablet Take 0.5 tablets (12.5 mg total) by mouth daily. 30 tablet 0  . nitroGLYCERIN (NITROSTAT) 0.4 MG SL tablet  Place 1 tablet (0.4 mg total) under the tongue every 5 (five) minutes as needed. (Patient taking differently: Place 0.4 mg under the tongue every 5 (five) minutes as needed for chest pain. ) 25 tablet 3  . omeprazole (PRILOSEC) 40 MG capsule Take 30- 60 min before your first and last meals of the day 60 capsule 2  . potassium chloride SA (KLOR-CON M20) 20 MEQ tablet Take 1 tablet (20 mEq total) by mouth daily. 30 tablet 0  . rosuvastatin (CRESTOR) 20 MG tablet Take 1 tablet (20 mg total) by mouth daily. 90 tablet 2  . topiramate (TOPAMAX) 25 MG tablet Take 1 tablet (25 mg total) by mouth 2 (two) times daily. 180 tablet 1  . torsemide (DEMADEX) 20 MG tablet Take 1 tablet (20 mg total) by mouth daily. 30 tablet 0  . varenicline (CHANTIX STARTING MONTH PAK) 0.5 MG X 11 & 1 MG X 42 tablet TAKE AS DIRECTED ON PACKAGE 53 each 0   No current facility-administered medications for this encounter.     Vitals:   09/22/19 1023  BP: (!) 146/88  Pulse: 81  SpO2: 96%  Weight: 103.5 kg (228 lb 3.2 oz)   Wt Readings from Last 3 Encounters:  09/22/19 103.5 kg (228 lb 3.2 oz)  09/05/19 105.4 kg (232 lb 6.4 oz)  09/01/19 106.3 kg (234 lb 6.4 oz)    PHYSICAL EXAM: PHYSICAL EXAM: General: obese WF. Well appearing. No respiratory difficulty HEENT: normal Neck: supple. no JVD. Carotids 2+ bilat; no bruits. No lymphadenopathy or thyromegaly appreciated. Cor: PMI nondisplaced. Regular rate & rhythm. No rubs, gallops or murmurs. Lungs: clear Abdomen: obese soft, nontender, nondistended. No hepatosplenomegaly. No bruits or masses. Good bowel sounds. Extremities: no cyanosis, clubbing, rash, edema Neuro: alert & oriented x 3, cranial nerves grossly intact. moves all 4 extremities w/o difficulty. Affect  pleasant.    ECG: N/A  ASSESSMENT & PLAN:  2. Chronic biventricular HF  - echo EF 25-30%. RV severely HK  - EF newly down. NICM.   - Cath 2018 with minimal CAD - recent Myeloma panel- no M spike and  PYP Scan negative for amyloid.  - NYHA II-III. Volume status stable. Wt down from 234>>228. - Continue torsemide 20 mg daily.  - Continue low dose losartan 12.5 mg daily. Holding off on increasing ARB w/ recent elevated SCr.  - Avoiding  blocker given COPD - Increae hydralazine to 50 mg three times a day  - Continue Imdur 30 mg - Continue daily wts and low sodium diet    3. Chronic hypoxic respiratory failure - multifactorial - Sats stable on room air.     4. PAH with /cor pulmonale - likely who GROUP II (left heart dz) & III (COPD and OSA) - Has sleep apnea but has not used CPAP.  - has appt next week w/ sleep specialist for sleep study evaluation   5. CKE Stage III - baseline creatinine 1.4-1.7 - BMP stable on last visit SCr 1.7 -no change in diuretic or ARB dose today  6. COPD with ongoing tobacco use - She has not smoked since discharge. She was congratulated on her efforts  7. Morbid obesity Body mass index is 41.74 kg/m. -She has made lifestyle modifications. She is exercising and eating better. Encouraged to continue on this path  8. OSA - needs CPAP.   -repeat sleep study has been ordered   9. DM2 - consider SGLT2i at some point - renal function permitting. Will not add yet. GFR ~30   Keep appt w/ pharmacy 11/5 for further med optimization     Angel Perusse  PA-C   10:56 AM

## 2019-09-22 NOTE — Patient Instructions (Signed)
INCREASE Hydralazine to 50mg  tab three times daily.  Please keep follow up appointment in November with the Pharmacist.  Please follow up with the [facility] in December 2020.  At the [facility], you and your health needs are our priority. As part of our continuing mission to provide you with exceptional heart care, we have created designated Provider Care Teams. These Care Teams include your primary Cardiologist (physician) and Advanced Practice Providers (APPs- Physician Assistants and Nurse Practitioners) who all work together to provide you with the care you need, when you need it.   You may see any of the following providers on your designated Care Team at your next follow up: Marland Kitchen Dr Glori Bickers . Dr Loralie Champagne . Darrick Grinder, NP . Lyda Jester, PA   Please be sure to bring in all your medications bottles to every appointment.

## 2019-09-24 ENCOUNTER — Other Ambulatory Visit: Payer: Self-pay | Admitting: Family Medicine

## 2019-09-25 ENCOUNTER — Other Ambulatory Visit (HOSPITAL_COMMUNITY): Payer: Self-pay | Admitting: Adult Health

## 2019-09-25 DIAGNOSIS — I5032 Chronic diastolic (congestive) heart failure: Secondary | ICD-10-CM

## 2019-09-26 ENCOUNTER — Other Ambulatory Visit: Payer: Self-pay | Admitting: Family Medicine

## 2019-09-26 ENCOUNTER — Other Ambulatory Visit (HOSPITAL_COMMUNITY): Payer: Self-pay | Admitting: Adult Health

## 2019-09-26 ENCOUNTER — Encounter: Payer: Self-pay | Admitting: Family Medicine

## 2019-09-26 DIAGNOSIS — E1122 Type 2 diabetes mellitus with diabetic chronic kidney disease: Secondary | ICD-10-CM | POA: Diagnosis not present

## 2019-09-26 DIAGNOSIS — G4733 Obstructive sleep apnea (adult) (pediatric): Secondary | ICD-10-CM | POA: Diagnosis not present

## 2019-09-26 DIAGNOSIS — N183 Chronic kidney disease, stage 3 unspecified: Secondary | ICD-10-CM | POA: Diagnosis not present

## 2019-09-26 DIAGNOSIS — E785 Hyperlipidemia, unspecified: Secondary | ICD-10-CM | POA: Diagnosis not present

## 2019-09-26 DIAGNOSIS — I2510 Atherosclerotic heart disease of native coronary artery without angina pectoris: Secondary | ICD-10-CM | POA: Diagnosis not present

## 2019-09-26 DIAGNOSIS — I2781 Cor pulmonale (chronic): Secondary | ICD-10-CM | POA: Diagnosis not present

## 2019-09-26 DIAGNOSIS — M419 Scoliosis, unspecified: Secondary | ICD-10-CM | POA: Diagnosis not present

## 2019-09-26 DIAGNOSIS — F329 Major depressive disorder, single episode, unspecified: Secondary | ICD-10-CM | POA: Diagnosis not present

## 2019-09-26 DIAGNOSIS — J449 Chronic obstructive pulmonary disease, unspecified: Secondary | ICD-10-CM | POA: Diagnosis not present

## 2019-09-26 DIAGNOSIS — M1990 Unspecified osteoarthritis, unspecified site: Secondary | ICD-10-CM | POA: Diagnosis not present

## 2019-09-26 DIAGNOSIS — N390 Urinary tract infection, site not specified: Secondary | ICD-10-CM | POA: Diagnosis not present

## 2019-09-26 DIAGNOSIS — I6523 Occlusion and stenosis of bilateral carotid arteries: Secondary | ICD-10-CM | POA: Diagnosis not present

## 2019-09-26 DIAGNOSIS — I5082 Biventricular heart failure: Secondary | ICD-10-CM | POA: Diagnosis not present

## 2019-09-26 DIAGNOSIS — F419 Anxiety disorder, unspecified: Secondary | ICD-10-CM | POA: Diagnosis not present

## 2019-09-26 DIAGNOSIS — I272 Pulmonary hypertension, unspecified: Secondary | ICD-10-CM | POA: Diagnosis not present

## 2019-09-26 DIAGNOSIS — K5790 Diverticulosis of intestine, part unspecified, without perforation or abscess without bleeding: Secondary | ICD-10-CM | POA: Diagnosis not present

## 2019-09-26 DIAGNOSIS — M1030 Gout due to renal impairment, unspecified site: Secondary | ICD-10-CM | POA: Diagnosis not present

## 2019-09-26 DIAGNOSIS — N179 Acute kidney failure, unspecified: Secondary | ICD-10-CM | POA: Diagnosis not present

## 2019-09-26 DIAGNOSIS — Z7982 Long term (current) use of aspirin: Secondary | ICD-10-CM | POA: Diagnosis not present

## 2019-09-26 DIAGNOSIS — I110 Hypertensive heart disease with heart failure: Secondary | ICD-10-CM | POA: Diagnosis not present

## 2019-09-26 DIAGNOSIS — F17210 Nicotine dependence, cigarettes, uncomplicated: Secondary | ICD-10-CM | POA: Diagnosis not present

## 2019-09-26 DIAGNOSIS — I5043 Acute on chronic combined systolic (congestive) and diastolic (congestive) heart failure: Secondary | ICD-10-CM | POA: Diagnosis not present

## 2019-09-26 DIAGNOSIS — G9600 Cerebrospinal fluid leak, unspecified: Secondary | ICD-10-CM | POA: Diagnosis not present

## 2019-09-26 DIAGNOSIS — Z1211 Encounter for screening for malignant neoplasm of colon: Secondary | ICD-10-CM

## 2019-09-26 DIAGNOSIS — I471 Supraventricular tachycardia: Secondary | ICD-10-CM | POA: Diagnosis not present

## 2019-09-27 DIAGNOSIS — I110 Hypertensive heart disease with heart failure: Secondary | ICD-10-CM | POA: Diagnosis not present

## 2019-09-27 DIAGNOSIS — N179 Acute kidney failure, unspecified: Secondary | ICD-10-CM | POA: Diagnosis not present

## 2019-09-27 DIAGNOSIS — I5043 Acute on chronic combined systolic (congestive) and diastolic (congestive) heart failure: Secondary | ICD-10-CM | POA: Diagnosis not present

## 2019-09-27 DIAGNOSIS — I5082 Biventricular heart failure: Secondary | ICD-10-CM | POA: Diagnosis not present

## 2019-09-27 DIAGNOSIS — I2781 Cor pulmonale (chronic): Secondary | ICD-10-CM | POA: Diagnosis not present

## 2019-09-27 DIAGNOSIS — I272 Pulmonary hypertension, unspecified: Secondary | ICD-10-CM | POA: Diagnosis not present

## 2019-09-28 ENCOUNTER — Ambulatory Visit: Payer: Medicare Other | Admitting: Pulmonary Disease

## 2019-09-29 DIAGNOSIS — I110 Hypertensive heart disease with heart failure: Secondary | ICD-10-CM | POA: Diagnosis not present

## 2019-09-29 DIAGNOSIS — I272 Pulmonary hypertension, unspecified: Secondary | ICD-10-CM | POA: Diagnosis not present

## 2019-09-29 DIAGNOSIS — I5082 Biventricular heart failure: Secondary | ICD-10-CM | POA: Diagnosis not present

## 2019-09-29 DIAGNOSIS — N179 Acute kidney failure, unspecified: Secondary | ICD-10-CM | POA: Diagnosis not present

## 2019-09-29 DIAGNOSIS — I5043 Acute on chronic combined systolic (congestive) and diastolic (congestive) heart failure: Secondary | ICD-10-CM | POA: Diagnosis not present

## 2019-09-29 DIAGNOSIS — I2781 Cor pulmonale (chronic): Secondary | ICD-10-CM | POA: Diagnosis not present

## 2019-10-03 ENCOUNTER — Telehealth: Payer: Self-pay

## 2019-10-03 NOTE — Telephone Encounter (Signed)
Questions for Screening COVID-19  Symptom onset: N/a  Travel or Contacts: No  During this illness, did/does the patient experience any of the following symptoms? Fever >100.11F []   Yes [x]   No []   Unknown Subjective fever (felt feverish) []   Yes [x]   No []   Unknown Chills []   Yes [x]   No []   Unknown Muscle aches (myalgia) []   Yes [x]   No []   Unknown Runny nose (rhinorrhea) []   Yes [x]   No []   Unknown Sore throat []   Yes [x]   No []   Unknown Cough (new onset or worsening of chronic cough) []   Yes [x]   No []   Unknown Shortness of breath (dyspnea) []   Yes [x]   No []   Unknown Nausea or vomiting []   Yes [x]   No []   Unknown Headache []   Yes [x]   No []   Unknown Abdominal pain  []   Yes [x]   No []   Unknown Diarrhea (?3 loose/looser than normal stools/24hr period) []   Yes [x]   No []   Unknown Other, specify:

## 2019-10-04 ENCOUNTER — Ambulatory Visit: Payer: Medicare Other | Admitting: Family Medicine

## 2019-10-05 ENCOUNTER — Other Ambulatory Visit: Payer: Self-pay

## 2019-10-05 ENCOUNTER — Encounter: Payer: Self-pay | Admitting: Pulmonary Disease

## 2019-10-05 ENCOUNTER — Ambulatory Visit (INDEPENDENT_AMBULATORY_CARE_PROVIDER_SITE_OTHER): Payer: Medicare Other | Admitting: Pulmonary Disease

## 2019-10-05 VITALS — BP 130/82 | HR 76 | Temp 97.0°F | Ht 63.0 in | Wt 227.0 lb

## 2019-10-05 DIAGNOSIS — G4733 Obstructive sleep apnea (adult) (pediatric): Secondary | ICD-10-CM | POA: Diagnosis not present

## 2019-10-05 NOTE — Progress Notes (Signed)
[name]    [id number]    11-05-1956  Primary Care Physician:Matthews, Einar Pheasant, DO  Referring Physician: Luetta Nutting, [place],   [id number]  Chief complaint:   Patient is being seen for obstructive sleep apnea  HPI:  She was diagnosed with severe obstructive sleep apnea in 2016, she was only able to use CPAP for couple of days She was the primary caregiver for her mother was not available at the time  Has not been using CPAP since then She has a history of pulmonary hypertension, chronic obstructive pulmonary disease, coronary artery disease She has recently quit smoking She has recently lost about 25 to 30 pounds with weight watchers  Still admits to snoring Nonrestorative sleep Dryness of the mouth in the mornings Occasional choking episodes at night Headaches in the mornings Usually goes to bed between 9 and 10 PM Wakes up about every 2 to 3 hours to pee secondary to being on Lasix Final wake up time about 9 AM  Bedroom environment is conducive to sleep, no unusual noise  Pets: Dog Occupation: No significant predisposition Smoking history: Quit about 2 months ago Travel history: Relevant family history:  Outpatient Encounter Medications as of 10/05/2019  Medication Sig  . acetaminophen (TYLENOL) 325 MG tablet Take 2 tablets (650 mg total) by mouth every 6 (six) hours as needed for mild pain.  Marland Kitchen allopurinol (ZYLOPRIM) 100 MG tablet Take 100 mg by mouth daily.   Marland Kitchen aspirin EC 81 MG tablet Take 1 tablet (81 mg total) by mouth daily.  . Blood Glucose Monitoring Suppl (ONETOUCH VERIO) w/Device KIT USE TO CHECK BLOOD GLUCOSE 4 TIMES A DAY . DIAGNOSIS: TYPE 2 DIABETES E11.59  . busPIRone (BUSPAR) 15 MG tablet Take 15 mg by mouth 2 (two) times daily.   . CHANTIX STARTING MONTH PAK 0.5 MG X 11 & 1 MG X 42 tablet TAKE AS DIRECTED ON PACKAGE  . colchicine 0.6 MG tablet Take 1.35m initially followed by 0.678m1 hour later.   Continue 0.78m71maily until gout is resolving.  . gabapentin (NEURONTIN) 400 MG capsule Take 1 capsule (400 mg total) by mouth at bedtime.  . gMarland Kitchenucose blood (ONETOUCH VERIO) test strip For E11.22  . hydrALAZINE (APRESOLINE) 50 MG tablet Take 1 tablet (50 mg total) by mouth every 8 (eight) hours.  . insulin aspart (NOVOLOG FLEXPEN) 100 UNIT/ML FlexPen Inject 5 Units into the skin 3 (three) times daily with meals.  . Insulin Glargine 300 UNIT/ML SOPN Inject 60 Units into the skin daily.  . KMarland KitchenOR-CON M20 20 MEQ tablet TAKE 1 TABLET BY MOUTH EVERY DAY  . losartan (COZAAR) 25 MG tablet Take 0.5 tablets (12.5 mg total) by mouth daily.  . nitroGLYCERIN (NITROSTAT) 0.4 MG SL tablet Place 1 tablet (0.4 mg total) under the tongue every 5 (five) minutes as needed. (Patient taking differently: Place 0.4 mg under the tongue every 5 (five) minutes as needed for chest pain. )  . omeprazole (PRILOSEC) 40 MG capsule TAKE 1 CAPSULE BY MOUTH 30- 60 MIN BEFORE YOUR FIRST AND LAST MEALS OF THE DAY  . rosuvastatin (CRESTOR) 20 MG tablet Take 1 tablet (20 mg total) by mouth daily.  . tMarland Kitchenpiramate (TOPAMAX) 25 MG tablet Take 1 tablet (25 mg total) by mouth 2 (two) times daily.  . tMarland Kitchenrsemide (DEMADEX) 20 MG tablet Take 1 tablet (20 mg total) by mouth daily.  . VENTOLIN HFA 108 (90 Base) MCG/ACT inhaler TAKE  2 PUFFS BY MOUTH EVERY 6 HOURS AS NEEDED FOR WHEEZE OR SHORTNESS OF BREATH  . FLUoxetine (PROZAC) 20 MG capsule TAKE 1 CAPSULE (20 MG TOTAL) BY MOUTH AT BEDTIME.  . isosorbide mononitrate (IMDUR) 30 MG 24 hr tablet Take 1 tablet (30 mg total) by mouth daily. ** DO NOT CRUSH **   No facility-administered encounter medications on file as of 10/05/2019.     Allergies as of 10/05/2019 - Review Complete 10/05/2019  Allergen Reaction Noted  . Atorvastatin Other (See Comments) and Nausea And Vomiting 07/26/2012  . Ibuprofen Other (See Comments) 06/18/2017  . Metformin and related Nausea And Vomiting 07/26/2012  . Ace  inhibitors Cough and Nausea And Vomiting 04/28/2012  . Hydrocodone-acetaminophen Nausea And Vomiting 06/19/2014  . Albuterol Other (See Comments) 07/30/2017    Past Medical History:  Diagnosis Date  . Anxiety    doesn't take any meds for this  . Arteriosclerosis of coronary artery 08/31/2011   Myoview 9/12: EF 44, no ischemia // Myoview 8/18: EF 57, low risk, possible small area of inferior ischemia // Minor nonobstructive CAD by cardiac catheterization - LHC 8/18: Proximal LAD 35, OM2 25  . Arthritis    knuckles  . Asthma   . Carotid artery disease ([place])    Carotid US 7/14: Bilateral ICA 40-59 // Carotid US ([facility]) 2/17: bilat plaque without sig stenosis  . Chronic diastolic CHF (congestive heart failure) ([place]) 07/26/2017   Echo 7/18:Severe conc LVH, EF 60-65, no RWMA, Gr 2 DD, MAC, mild MR // right and left heart cath 8/18: LVEDP 21  . Claustrophobia 07/26/2012  . COPD (chronic obstructive pulmonary disease) ([place])    "chronic bronchitis - about every winter"  . CSF leak    dizziness/headache - assoc symptoms // 2/2 encephalocele s/p skull base reconstruction  . Diverticulosis   . History of CVA (cerebrovascular accident) 03/2004   denies residual 07/26/2012  . Hx of gout   . Hyperlipidemia    takes Pravastatin daily  . Hypertensive heart disease with chronic diastolic congestive heart failure ([place]) 07/26/2017  . Internal hemorrhoids   . Kidney stones   . LVH (left ventricular hypertrophy)    Echo 8/18: Severe conc LVH, EF 60-65, no RWMA, Gr 2 DD, MAC, mild MR  . OSA (obstructive sleep apnea) 07/26/2012   prior CPAP - stopped due to being primary caregiver for dying parent (stopped in 2016)  . PSVT (paroxysmal supraventricular tachycardia) ([place])   . Pulmonary hypertension, unspecified ([place]) 08/14/2017   cath 07/2017 >>PASP 58m Hg, PA mean 39 mm Hg  . Type II diabetes mellitus ([place]     Past Surgical History:  Procedure Laterality Date  . ESOPHAGOGASTRODUODENOSCOPY (EGD) WITH  PROPOFOL N/A 07/13/2015   Procedure: ESOPHAGOGASTRODUODENOSCOPY (EGD) WITH PROPOFOL;  Surgeon: PCarol Ada MD;  Location: [facility];  Service: Endoscopy;  Laterality: N/A;  . NASAL SINUS SURGERY  01/29/2012   Procedure: ENDOSCOPIC SINUS SURGERY WITH STEALTH;  Surgeon: MRuby Cola MD;  [facility]  Service: ENT;  Laterality: N/A;  . NM MYOVIEW LTD  08/25/2011   Low risk study, no evidence of ischemia, EF 44%  . REFRACTIVE SURGERY  ~ 2010   left  . RIGHT/LEFT HEART CATH AND CORONARY ANGIOGRAPHY N/A 07/30/2017   Procedure: RIGHT/LEFT HEART CATH AND CORONARY ANGIOGRAPHY;  Surgeon: JMartinique Peter M, MD;  [facility];  Service: Cardiovascular;  Laterality: N/A;  . SPHENOIDECTOMY  01/29/2012   Procedure: SPHENOIDECTOMY;  Surgeon: MRuby Cola MD;  [facility];  Service: ENT;  Laterality: Left;  REPAIR OF LEFT SPHENOID    . TOTAL ABDOMINAL HYSTERECTOMY  1994    Family History  Problem Relation Age of Onset  . Colon cancer Sister 55  . Breast cancer Mother 57  . Alzheimer's disease Mother   . Diabetes Sister 85  . Heart failure Sister   . Diabetes Sister 83  . Lung cancer Father   . Breast cancer Sister   . Cancer Sister        mouth  . Anesthesia problems Neg Hx   . Hypotension Neg Hx   . Malignant hyperthermia Neg Hx   . Pseudochol deficiency Neg Hx     Social History   Socioeconomic History  . Marital status: Single    Spouse name: Not on file  . Number of children: 0  . Years of education: Not on file  . Highest education level: Not on file  Occupational History  . Occupation: disabled  Social Needs  . Financial resource strain: Not on file  . Food insecurity    Worry: Not on file    Inability: Not on file  . Transportation needs    Medical: Not on file    Non-medical: Not on file  Tobacco Use  . Smoking status: Former Smoker    Packs/day: 0.50    Years: 35.00    Pack years: 17.50    Types: Cigarettes    Quit date: 08/16/2019    Years  since quitting: 0.1  . Smokeless tobacco: Never Used  Substance and Sexual Activity  . Alcohol use: Yes    Alcohol/week: 1.0 standard drinks    Types: 1 Glasses of wine per week    Comment: rarely  . Drug use: No  . Sexual activity: Never    Birth control/protection: Surgical  Lifestyle  . Physical activity    Days per week: Not on file    Minutes per session: Not on file  . Stress: Not on file  Relationships  . Social Herbalist on phone: Not on file    Gets together: Not on file    Attends religious service: Not on file    Active member of club or organization: Not on file    Attends meetings of clubs or organizations: Not on file    Relationship status: Not on file  . Intimate partner violence    Fear of current or ex partner: Not on file    Emotionally abused: Not on file    Physically abused: Not on file    Forced sexual activity: Not on file  Other Topics Concern  . Not on file  Social History Narrative   Currently unemployed.   No children, not married    Review of Systems  Constitutional: Negative.   HENT: Negative.   Respiratory: Positive for apnea and shortness of breath.   Cardiovascular: Positive for leg swelling. Negative for chest pain.  Gastrointestinal: Negative.   Genitourinary: Negative.   Musculoskeletal: Negative.   Psychiatric/Behavioral: Positive for sleep disturbance.    Vitals:   10/05/19 1508  BP: 130/82  Pulse: 76  Temp: (!) 97 F (36.1 C)  SpO2: 97%     Physical Exam  Constitutional: She is oriented to person, place, and time.  HENT:  Head: Normocephalic and atraumatic.  Crowded oropharynx, Mallampati 4  Eyes: Pupils are equal, round, and reactive to light. Right eye exhibits no discharge. Left eye exhibits no discharge.  Neck: Normal range  of motion. Neck supple. No tracheal deviation present. No thyromegaly present.  Cardiovascular: Normal rate and regular rhythm.  Pulmonary/Chest: Effort normal and breath sounds  normal. No respiratory distress. She has no wheezes. She has no rales. She exhibits no tenderness.  Abdominal: Soft.  Musculoskeletal: Normal range of motion.        General: Edema present.  Neurological: She is alert and oriented to person, place, and time.  Skin: Skin is warm and dry. No rash noted. No erythema.  Psychiatric: She has a normal mood and affect.   Data Reviewed: Sleep study from 2016 reviewed showing severe obstructive sleep apnea PFT from 2019 reveals moderate restrictive disease Echocardiogram reveals decreased ejection fraction with severely decreased right-sided systolic function  Assessment:  History of severe obstructive sleep apnea -Severe obstructive sleep apnea diagnosed in 2016 -Was not able to use CPAP at the time secondary to being a caregiver for her mother -She does have significant right-sided heart disease  History of pulmonary hypertension with right-sided heart failure -As evident on recent echocardiogram  History of restrictive lung disease  Morbid obesity  Pathophysiology of sleep disordered breathing discussed with the patient  Treatment options discussed with the patient  Plan/Recommendations: We will schedule the patient for home sleep study  Will advise patient of results as soon as results become available  We will set up CPAP therapy for patient  Encouraged to stay off cigarettes  Encouraged to continue graded exercises  I will see her back in the office in about 3 months  Patient is well-known to Dr. Valeta Harms Will also follow up with Dr. Valeta Harms as scheduled   Sherrilyn Rist MD [facility] 10/05/2019, 3:11 PM  CC: Luetta Nutting, DO

## 2019-10-05 NOTE — Patient Instructions (Signed)
History of severe obstructive sleep apnea  We will repeat your home sleep study  We will let you know what the results are  Set you up with CPAP   I will see you back in about 3 months  Call with significant concerns   Sleep Apnea Sleep apnea is a condition in which breathing pauses or becomes shallow during sleep. Episodes of sleep apnea usually last 10 seconds or longer, and they may occur as many as 20 times an hour. Sleep apnea disrupts your sleep and keeps your body from getting the rest that it needs. This condition can increase your risk of certain health problems, including:  Heart attack.  Stroke.  Obesity.  Diabetes.  Heart failure.  Irregular heartbeat. What are the causes? There are three kinds of sleep apnea:  Obstructive sleep apnea. This kind is caused by a blocked or collapsed airway.  Central sleep apnea. This kind happens when the part of the brain that controls breathing does not send the correct signals to the muscles that control breathing.  Mixed sleep apnea. This is a combination of obstructive and central sleep apnea. The most common cause of this condition is a collapsed or blocked airway. An airway can collapse or become blocked if:  Your throat muscles are abnormally relaxed.  Your tongue and tonsils are larger than normal.  You are overweight.  Your airway is smaller than normal. What increases the risk? You are more likely to develop this condition if you:  Are overweight.  Smoke.  Have a smaller than normal airway.  Are elderly.  Are female.  Drink alcohol.  Take sedatives or tranquilizers.  Have a family history of sleep apnea. What are the signs or symptoms? Symptoms of this condition include:  Trouble staying asleep.  Daytime sleepiness and tiredness.  Irritability.  Loud snoring.  Morning headaches.  Trouble concentrating.  Forgetfulness.  Decreased interest in sex.  Unexplained sleepiness.  Mood  swings.  Personality changes.  Feelings of depression.  Waking up often during the night to urinate.  Dry mouth.  Sore throat. How is this diagnosed? This condition may be diagnosed with:  A medical history.  A physical exam.  A series of tests that are done while you are sleeping (sleep study). These tests are usually done in a sleep lab, but they may also be done at home. How is this treated? Treatment for this condition aims to restore normal breathing and to ease symptoms during sleep. It may involve managing health issues that can affect breathing, such as high blood pressure or obesity. Treatment may include:  Sleeping on your side.  Using a decongestant if you have nasal congestion.  Avoiding the use of depressants, including alcohol, sedatives, and narcotics.  Losing weight if you are overweight.  Making changes to your diet.  Quitting smoking.  Using a device to open your airway while you sleep, such as: ? An oral appliance. This is a custom-made mouthpiece that shifts your lower jaw forward. ? A continuous positive airway pressure (CPAP) device. This device blows air through a mask when you breathe out (exhale). ? A nasal expiratory positive airway pressure (EPAP) device. This device has valves that you put into each nostril. ? A bi-level positive airway pressure (BPAP) device. This device blows air through a mask when you breathe in (inhale) and breathe out (exhale).  Having surgery if other treatments do not work. During surgery, excess tissue is removed to create a wider airway. It is important  to get treatment for sleep apnea. Without treatment, this condition can lead to:  High blood pressure.  Coronary artery disease.  In men, an inability to achieve or maintain an erection (impotence).  Reduced thinking abilities. Follow these instructions at home: Lifestyle  Make any lifestyle changes that your health care provider recommends.  Eat a healthy,  well-balanced diet.  Take steps to lose weight if you are overweight.  Avoid using depressants, including alcohol, sedatives, and narcotics.  Do not use any products that contain nicotine or tobacco, such as cigarettes, e-cigarettes, and chewing tobacco. If you need help quitting, ask your health care provider. General instructions  Take over-the-counter and prescription medicines only as told by your health care provider.  If you were given a device to open your airway while you sleep, use it only as told by your health care provider.  If you are having surgery, make sure to tell your health care provider you have sleep apnea. You may need to bring your device with you.  Keep all follow-up visits as told by your health care provider. This is important. Contact a health care provider if:  The device that you received to open your airway during sleep is uncomfortable or does not seem to be working.  Your symptoms do not improve.  Your symptoms get worse. Get help right away if:  You develop: ? Chest pain. ? Shortness of breath. ? Discomfort in your back, arms, or stomach.  You have: ? Trouble speaking. ? Weakness on one side of your body. ? Drooping in your face. These symptoms may represent a serious problem that is an emergency. Do not wait to see if the symptoms will go away. Get medical help right away. Call your local emergency services (911 in the [place] Do not drive yourself to the hospital. Summary  Sleep apnea is a condition in which breathing pauses or becomes shallow during sleep.  The most common cause is a collapsed or blocked airway.  The goal of treatment is to restore normal breathing and to ease symptoms during sleep. This information is not intended to replace advice given to you by your health care provider. Make sure you discuss any questions you have with your health care provider. Document Released: 11/14/2002 Document Revised: 09/10/2018 Document  Reviewed: 07/20/2018 Elsevier Patient Education  2020 [employer].

## 2019-10-06 DIAGNOSIS — I2781 Cor pulmonale (chronic): Secondary | ICD-10-CM | POA: Diagnosis not present

## 2019-10-06 DIAGNOSIS — I110 Hypertensive heart disease with heart failure: Secondary | ICD-10-CM | POA: Diagnosis not present

## 2019-10-06 DIAGNOSIS — I272 Pulmonary hypertension, unspecified: Secondary | ICD-10-CM | POA: Diagnosis not present

## 2019-10-06 DIAGNOSIS — I5082 Biventricular heart failure: Secondary | ICD-10-CM | POA: Diagnosis not present

## 2019-10-06 DIAGNOSIS — N179 Acute kidney failure, unspecified: Secondary | ICD-10-CM | POA: Diagnosis not present

## 2019-10-06 DIAGNOSIS — I5043 Acute on chronic combined systolic (congestive) and diastolic (congestive) heart failure: Secondary | ICD-10-CM | POA: Diagnosis not present

## 2019-10-11 DIAGNOSIS — I5030 Unspecified diastolic (congestive) heart failure: Secondary | ICD-10-CM | POA: Diagnosis not present

## 2019-10-11 DIAGNOSIS — E785 Hyperlipidemia, unspecified: Secondary | ICD-10-CM | POA: Diagnosis not present

## 2019-10-11 DIAGNOSIS — Z720 Tobacco use: Secondary | ICD-10-CM | POA: Diagnosis not present

## 2019-10-11 DIAGNOSIS — N1832 Chronic kidney disease, stage 3b: Secondary | ICD-10-CM | POA: Diagnosis not present

## 2019-10-11 DIAGNOSIS — I129 Hypertensive chronic kidney disease with stage 1 through stage 4 chronic kidney disease, or unspecified chronic kidney disease: Secondary | ICD-10-CM | POA: Diagnosis not present

## 2019-10-11 DIAGNOSIS — G4733 Obstructive sleep apnea (adult) (pediatric): Secondary | ICD-10-CM | POA: Diagnosis not present

## 2019-10-11 DIAGNOSIS — E1122 Type 2 diabetes mellitus with diabetic chronic kidney disease: Secondary | ICD-10-CM | POA: Diagnosis not present

## 2019-10-11 DIAGNOSIS — N179 Acute kidney failure, unspecified: Secondary | ICD-10-CM | POA: Diagnosis not present

## 2019-10-13 ENCOUNTER — Other Ambulatory Visit: Payer: Self-pay

## 2019-10-13 ENCOUNTER — Ambulatory Visit (INDEPENDENT_AMBULATORY_CARE_PROVIDER_SITE_OTHER): Payer: Medicare Other | Admitting: Podiatry

## 2019-10-13 ENCOUNTER — Ambulatory Visit (HOSPITAL_COMMUNITY)
Admission: RE | Admit: 2019-10-13 | Discharge: 2019-10-13 | Disposition: A | Discharge status: Home / Self Care | Payer: Medicare Other | Source: Ambulatory Visit | Attending: Internal Medicine | Admitting: Internal Medicine

## 2019-10-13 VITALS — BP 140/86 | HR 76 | Wt 226.6 lb

## 2019-10-13 DIAGNOSIS — B351 Tinea unguium: Secondary | ICD-10-CM

## 2019-10-13 DIAGNOSIS — I5022 Chronic systolic (congestive) heart failure: Secondary | ICD-10-CM | POA: Insufficient documentation

## 2019-10-13 DIAGNOSIS — M79676 Pain in unspecified toe(s): Secondary | ICD-10-CM | POA: Diagnosis not present

## 2019-10-13 DIAGNOSIS — E1140 Type 2 diabetes mellitus with diabetic neuropathy, unspecified: Secondary | ICD-10-CM

## 2019-10-13 MED ORDER — ISOSORBIDE MONONITRATE ER 60 MG PO TB24: 60.0000 mg | ORAL_TABLET | Freq: Every day | ORAL | 11 refills | Status: DC

## 2019-10-13 MED ORDER — EMPAGLIFLOZIN 10 MG PO TABS: 10.0000 mg | ORAL_TABLET | Freq: Every day | ORAL | 11 refills | Status: DC

## 2019-10-13 NOTE — Patient Instructions (Addendum)
It was a pleasure seeing you today!  MEDICATIONS: -We are changing your medications today - I will call [facility] and review your labs. If they look good, we will start Jardiance 10 mg (1 tab) daily. -Call if you have questions about your medications.  NEXT APPOINTMENT: Return to clinic in 2 weeks with Pharmacy [facility].  In general, to take care of your heart failure: -Limit your fluid intake to 2 Liters (half-gallon) per day.   -Limit your salt intake to ideally 2-3 grams (2000-3000 mg) per day. -Weigh yourself daily and record, and bring that "weight diary" to your next appointment.  (Weight gain of 2-3 pounds in 1 day typically means fluid weight.) -The medications for your heart are to help your heart and help you live longer.   -Please contact us before stopping any of your heart medications.  Call the clinic at [phone] with questions or to reschedule future appointments.

## 2019-10-14 ENCOUNTER — Other Ambulatory Visit: Payer: Self-pay

## 2019-10-14 ENCOUNTER — Encounter: Payer: Self-pay | Admitting: Family Medicine

## 2019-10-14 ENCOUNTER — Ambulatory Visit (INDEPENDENT_AMBULATORY_CARE_PROVIDER_SITE_OTHER): Payer: Medicare Other | Admitting: Family Medicine

## 2019-10-14 ENCOUNTER — Ambulatory Visit: Payer: Medicare Other

## 2019-10-14 VITALS — BP 142/88 | HR 77 | Temp 97.8°F | Ht 63.0 in | Wt 225.8 lb

## 2019-10-14 DIAGNOSIS — Z23 Encounter for immunization: Secondary | ICD-10-CM

## 2019-10-14 DIAGNOSIS — E1142 Type 2 diabetes mellitus with diabetic polyneuropathy: Secondary | ICD-10-CM | POA: Diagnosis not present

## 2019-10-14 DIAGNOSIS — G4733 Obstructive sleep apnea (adult) (pediatric): Secondary | ICD-10-CM | POA: Diagnosis not present

## 2019-10-14 DIAGNOSIS — E1122 Type 2 diabetes mellitus with diabetic chronic kidney disease: Secondary | ICD-10-CM | POA: Diagnosis not present

## 2019-10-14 DIAGNOSIS — F17210 Nicotine dependence, cigarettes, uncomplicated: Secondary | ICD-10-CM | POA: Diagnosis not present

## 2019-10-14 DIAGNOSIS — N183 Type 2 diabetes mellitus with diabetic chronic kidney disease: Secondary | ICD-10-CM

## 2019-10-14 DIAGNOSIS — I5082 Biventricular heart failure: Secondary | ICD-10-CM | POA: Diagnosis not present

## 2019-10-14 DIAGNOSIS — I10 Essential (primary) hypertension: Secondary | ICD-10-CM | POA: Diagnosis not present

## 2019-10-14 DIAGNOSIS — Z794 Long term (current) use of insulin: Secondary | ICD-10-CM

## 2019-10-14 DIAGNOSIS — Z1231 Encounter for screening mammogram for malignant neoplasm of breast: Secondary | ICD-10-CM | POA: Diagnosis not present

## 2019-10-14 LAB — BASIC METABOLIC PANEL
BUN: 25 mg/dL — ABNORMAL HIGH (ref 6–23)
CO2: 28 mEq/L (ref 19–32)
Calcium: 9 mg/dL (ref 8.4–10.5)
Chloride: 105 mEq/L (ref 96–112)
Creatinine, Ser: 1.53 mg/dL — ABNORMAL HIGH (ref 0.40–1.20)
GFR: 34.34 mL/min — ABNORMAL LOW (ref >60.00)
Glucose, Bld: 137 mg/dL — ABNORMAL HIGH (ref 70–99)
Potassium: 4.1 mEq/L (ref 3.5–5.1)
Sodium: 142 mEq/L (ref 135–145)

## 2019-10-14 LAB — HEMOGLOBIN A1C: Hgb A1c MFr Bld: 6.6 % — ABNORMAL HIGH (ref 4.6–6.5)

## 2019-10-14 NOTE — Patient Instructions (Addendum)
Diane, I am so very happy for you!  Keep up your hard work for continued weight loss and staying quit from smoking.  I would like to see you back in about 2 months.

## 2019-10-15 DIAGNOSIS — G4733 Obstructive sleep apnea (adult) (pediatric): Secondary | ICD-10-CM | POA: Diagnosis not present

## 2019-10-18 ENCOUNTER — Telehealth: Payer: Self-pay | Admitting: Pulmonary Disease

## 2019-10-18 DIAGNOSIS — G4733 Obstructive sleep apnea (adult) (pediatric): Secondary | ICD-10-CM

## 2019-10-18 NOTE — Telephone Encounter (Signed)
ATC Patient. LMTCB for sleep results. DME order pending.   Dr. Ander Slade has reviewed the home sleep test this showed mild sleep apnea.   Recommendations   Treatment options are CPAP with the settings auto 5 to 15.    Weight loss measures .   Advise against driving while sleepy & against medication with sedative side effects.    Make appointment for 3 months for compliance with download with Dr. Ander Slade.

## 2019-10-19 NOTE — Telephone Encounter (Signed)
ATC Patient.  LMTCB for sleep results.

## 2019-10-20 NOTE — Assessment & Plan Note (Signed)
-  Home blood sugars have improved, update a1c today.  -Encouraged to continue lifestyle changes -She will continue to follow with nephrology for CKD.

## 2019-10-20 NOTE — Progress Notes (Signed)
PCP: Primary Cardiologist: Dr Harrington Challenger HF MD; Dr Haroldine Laws  HPI:  Angel Bizzarro Sprinkleis a 63 y.o.femalewith a hx ofmorbid obesity,chronic systolic/diastolic CHF, PSVT, prior CVA, HTN, DM, OSA,CKD with baseline creatinine 1.3-1.4,COPDwith ongoing tobacco, pulmonary hypertensionand CSF rhinorrhea s/p skull base reconstruction.   Admitted with A/C diastolic heart failure. > 30 poundweight gain in a few weeks after her  PCP discontinued lasix 06/27/2019 secondary to worsening kidney function. In ER markedly volume overloaded. [name] was placed to guide therapy with initial swan numbers showing cardiac index 1.6 and elevated filling pressures. ECHO completed and showed biventricular HF with LVEF 25-30 and RV severely reduced. Myeloma panel- no M spike andPYPscan negative for amyloid. She was placed on milrinone and diuresed with IV lasix. As she improved, milrinone was weaned off. HF medications adjusted. Discharge weight was 236 pounds.   She presented to HF Clinic for post hospital f/u on 9/24 with NP. Overall was feeling fine.  Mild dyspnea with exertion. Denied PND/Orthopnea. No syncope.  Mild dizziness with standing. Appetite ok. No fever or chills. Weight at home 230 pounds.  BMP showed stable SCr at 1.7. K 4.1.   Recent presentation to HF Clinic on 09/22/19 with Angel Henri, PA-C. At that visit she reported that she was doing great. She was exercising more, walking at the school track. No exertional dyspnea w/ ADLs but has mild dyspnea w/ moderate activity. Weight stable. No LEE. Compliant w/ meds. Restricting salt in diet. Has quit smoking. She had an appt w/ sleep specialist next week in pulmonology clinic for sleep study evaluation.   Today she returns to HF clinic for pharmacist medication titration. At last visit, hydralazine was increased to 50 mg TID. Overall she has been doing very well. She will have occasional dizziness after taking medications, but this is not bothersome and  is infrequent. No chest pain. She does get SOB with mild activity but feels this has improved. She can walk halfway around the track without stopping. Her weight at home has been stable at 225-227 pounds. She feels she is at her dry weight. She takes torsemide 20 mg daily and has not needed any extra. No LEE, PND or orthopea. Her appetite is good. She has made a lot of positive changes since discharge, including no longer eating fast food and no longer eating canned goods, just fresh or frozen vegetables. She has quit smoking.     . Shortness of breath/dyspnea on exertion? yes - SOB with mild-moderate activity.  . Orthopnea/PND? no . Edema? no . Lightheadedness/dizziness? Occasional. Suggested taking mediations with food.  . Daily weights at home? Yes; 225-227 lbs. Weighs herself religiously at 11:30 AM daily.  . Blood pressure/heart rate monitoring at home? no . Following low-sodium/fluid-restricted diet? Yes - has made a lot of positive diet changes (see above).   HF Medications: Losartan 12.5 mg daily Hydralazine 50 mg TID Imdur 60 mg daily Torsemide 20 mg daily Potassium chloride 20 mEq daily  Has the patient been experiencing any side effects to the medications prescribed?  no  Does the patient have any problems obtaining medications due to transportation or finances?   No - has Pharmacist, community  Understanding of regimen: fair Understanding of indications: fair Potential of compliance: good Patient understands to avoid NSAIDs. Patient understands to avoid decongestants.    Pertinent Lab Values - obtained from [facility] on 10/13/2019.  Serum creatinine 1.39, BUN 19, Potassium 4.0, Sodium 140, BNP 889.6 (09/01/19), GFR 41. Protein/Creat ratio: 4482 (H)  Vital Signs: .  Weight: 226.6 (last clinic weight: 227) . Blood pressure: 140/86  . Heart rate: 76   Assessment: 2. Chronic biventricular HF  - echo EF 25-30%. RV severely HK, NICM  - Cath 2018 with minimal CAD -  recent Myeloma panel- no M spike andPYPScan negative for amyloid. - NYHA II-III.  - Volume status stable. Euvolemic on exam. Dry weight 225-227 lbs.  - Vitals: BP elevated at 140/86, HR 76 - Labs obtained from [facility] on 10/13/19: Scr improved from 1.72 to 1.39. GFR 41. K stable at 4.0 - Continue torsemide 20 mg daily.  - Continue low dose losartan 12.5 mg daily. Holding off on increasing ARB and initiating spironolactone w/ recent elevated SCr. Now back to baseline. Consider increasing losartan at next visit if renal function remains stable. - Avoiding ? blocker for now given low CO on recent cath. As she improves, could consider trial of bisoprolol given her COPD.  - Continue hydralazine to 50 mgthree times a day  - Continue Imdur 30 mg daily - Start Jardiance 10 mg daily for benefits in HF, T2DM and CKD. GFR 41. Repeat BMET in 2 weeks. Wilder Glade not preferred by insurance.  - Continue daily wts and low sodium diet   3. Chronic hypoxic respiratory failure - multifactorial - Sats stable on room air.    4. PAH with /cor pulmonale - likely who GROUP II (left heart dz) & III (COPD and OSA) - Has sleep apnea but has not used CPAP.  -Sleep specialist recommended sleep study evaluation.   5. CKE Stage III - baseline creatinine 1.4-1.7 - BMET stable on last visit SCr 1.7. Back to baseline of 1.39 on 10/13/19 on outside labs.  - no change in diuretic or ARB dose today - Start Jardiance 10 mg daily.   6. COPD with ongoing tobacco use - She has not smoked since discharge. She was congratulated on her efforts  7. Morbid obesity Body mass index is 41.74 kg/m. -She has made lifestyle modifications. She is exercising and eating better. Encouraged to continue on this path  8. OSA - needs CPAP.  - repeat sleep study has been ordered   9. DM2 -Hemoglobin A1C 8.2% (05/23/19) - Continue Lantus 60 units daily and Novolog 5 units TID.  - Start Jardiance 10 mg daily  for benefits in HF, T2DM and CKD. Will up-titrate to 25 mg daily at future visits as tolerated.  - Continue rosuvastatin 20 mg daily.   Plan: 1) Medication changes: Based on clinical presentation, vital signs and recent labs will start Jardiance 10 mg daily. Repeat BMET in 2 weeks.  2) Labs: Scr 1.39 (down from 1.79), K 4.1 3) Follow-up: Repeat BMET/Pharmacy Clinic in 2 week.    Audry Riles, PharmD, BCPS, CPP Heart Failure Clinic Pharmacist [phone]  Discussed patient with Dr. Lynelle Smoke.  Agree with assessment and plan.  Careful titration of HF medications in setting of CKD and low oupt HF.  Agree with initiation of SGLT2.   Bonnita Nasuti Pharm.D. CPP, BCPS Clinical Pharmacist [phone] 10/20/2019 11:50 AM

## 2019-10-20 NOTE — Progress Notes (Signed)
[name] - 63 y.o. female MRN [id number]  Date of birth: 20-May-1956  Subjective Chief Complaint  Patient presents with  . Follow-up    follow up a1c    HPI [name] is a 63 y.o. female with history of multiple medical problems including T2DM, HTN, CHF, OSA, prior CSF leak with skull base repair, and nicotine dependence here today for follow up visit.  She states she is doing well and feels good about her progress so far.    T2DM:  Current management with Lantus 60 units daily and novolog 5 units tid. She has been able to reduce her insulin requirements by changing her diet. She reports she is following a low carb diet and she has eliminated sweet tea and sodas.  Weight is down about 6 lbs since visit in September. She denies symptoms of hypoglycemia.  Blood sugar readings at home consistent <150.    OSA: History of OSA however she lost equipment due to prior non-compliance. She has been set up for repeat home sleep study.     HTN/CHF:  History of biventricular heart failure with cardio-renal syndrome.  She is followed by cardiology and nephrology.  She feels well without increased edema or shortness of breath.  She is compliant with current medications.    Nicotine dependence:  She has been able to abstain from smoking for about 8 weeks since discharge from hospital.  She has had occasional cravings but overall feels like she is doing well.   ROS:  A comprehensive ROS was completed and negative except as noted per HPI    Allergies  Allergen Reactions  . Atorvastatin Other (See Comments) and Nausea And Vomiting    "legs cramped; moderate to almost severe" "legs cramped; moderate to almost severe"  . Ibuprofen Other (See Comments)    Other reaction(s): Other (See Comments) Per pt, MD doesn't want pt to take Per pt, MD doesn't want pt to take  . Metformin And Related Nausea And Vomiting  . Ace Inhibitors Cough and Nausea And Vomiting  . Hydrocodone-Acetaminophen  Nausea And Vomiting  . Albuterol Other (See Comments)    Caused SVT    Past Medical History:  Diagnosis Date  . Anxiety    doesn't take any meds for this  . Arteriosclerosis of coronary artery 08/31/2011   Myoview 9/12: EF 44, no ischemia // Myoview 8/18: EF 57, low risk, possible small area of inferior ischemia // Minor nonobstructive CAD by cardiac catheterization - LHC 8/18: Proximal LAD 35, OM2 25  . Arthritis    knuckles  . Asthma   . Carotid artery disease ([place])    Carotid US 7/14: Bilateral ICA 40-59 // Carotid US ([facility]) 2/17: bilat plaque without sig stenosis  . Chronic diastolic CHF (congestive heart failure) ([place]) 07/26/2017   Echo 7/18:Severe conc LVH, EF 60-65, no RWMA, Gr 2 DD, MAC, mild MR // right and left heart cath 8/18: LVEDP 21  . Claustrophobia 07/26/2012  . COPD (chronic obstructive pulmonary disease) ([place])    "chronic bronchitis - about every winter"  . CSF leak    dizziness/headache - assoc symptoms // 2/2 encephalocele s/p skull base reconstruction  . Diverticulosis   . History of CVA (cerebrovascular accident) 03/2004   denies residual 07/26/2012  . Hx of gout   . Hyperlipidemia    takes Pravastatin daily  . Hypertensive heart disease with chronic diastolic congestive heart failure ([place]) 07/26/2017  . Internal hemorrhoids   . Kidney stones   .  LVH (left ventricular hypertrophy)    Echo 8/18: Severe conc LVH, EF 60-65, no RWMA, Gr 2 DD, MAC, mild MR  . OSA (obstructive sleep apnea) 07/26/2012   prior CPAP - stopped due to being primary caregiver for dying parent (stopped in 2016)  . PSVT (paroxysmal supraventricular tachycardia) ([place])   . Pulmonary hypertension, unspecified ([place]) 08/14/2017   cath 07/2017 >>PASP 88m Hg, PA mean 39 mm Hg  . Type II diabetes mellitus ([place]     Past Surgical History:  Procedure Laterality Date  . ESOPHAGOGASTRODUODENOSCOPY (EGD) WITH PROPOFOL N/A 07/13/2015   Procedure: ESOPHAGOGASTRODUODENOSCOPY (EGD) WITH PROPOFOL;   Surgeon: PCarol Ada MD;  Location: [facility];  Service: Endoscopy;  Laterality: N/A;  . NASAL SINUS SURGERY  01/29/2012   Procedure: ENDOSCOPIC SINUS SURGERY WITH STEALTH;  Surgeon: MRuby Cola MD;  [facility]  Service: ENT;  Laterality: N/A;  . NM MYOVIEW LTD  08/25/2011   Low risk study, no evidence of ischemia, EF 44%  . REFRACTIVE SURGERY  ~ 2010   left  . RIGHT/LEFT HEART CATH AND CORONARY ANGIOGRAPHY N/A 07/30/2017   Procedure: RIGHT/LEFT HEART CATH AND CORONARY ANGIOGRAPHY;  Surgeon: JMartinique Peter M, MD;  [facility];  Service: Cardiovascular;  Laterality: N/A;  . SPHENOIDECTOMY  01/29/2012   Procedure: SPHENOIDECTOMY;  Surgeon: MRuby Cola MD;  [facility]  Service: ENT;  Laterality: Left;  REPAIR OF LEFT SPHENOID    . TOTAL ABDOMINAL HYSTERECTOMY  1994    Social History   Socioeconomic History  . Marital status: Single    Spouse name: Not on file  . Number of children: 0  . Years of education: Not on file  . Highest education level: Not on file  Occupational History  . Occupation: disabled  Social Needs  . Financial resource strain: Not on file  . Food insecurity    Worry: Not on file    Inability: Not on file  . Transportation needs    Medical: Not on file    Non-medical: Not on file  Tobacco Use  . Smoking status: Former Smoker    Packs/day: 0.50    Years: 35.00    Pack years: 17.50    Types: Cigarettes    Quit date: 08/16/2019    Years since quitting: 0.1  . Smokeless tobacco: Never Used  Substance and Sexual Activity  . Alcohol use: Yes    Alcohol/week: 1.0 standard drinks    Types: 1 Glasses of wine per week    Comment: rarely  . Drug use: No  . Sexual activity: Never    Birth control/protection: Surgical  Lifestyle  . Physical activity    Days per week: Not on file    Minutes per session: Not on file  . Stress: Not on file  Relationships  . Social [place]: Not on file    Gets together: Not  on file    Attends religious service: Not on file    Active member of club or organization: Not on file    Attends meetings of clubs or organizations: Not on file    Relationship status: Not on file  Other Topics Concern  . Not on file  Social History Narrative   Currently unemployed.   No children, not married    Family History  Problem Relation Age of Onset  . Colon cancer Sister 470 . Breast cancer Mother 611 . Alzheimer's disease Mother   . Diabetes  Sister 75  . Heart failure Sister   . Diabetes Sister 24  . Lung cancer Father   . Breast cancer Sister   . Cancer Sister        mouth  . Anesthesia problems Neg Hx   . Hypotension Neg Hx   . Malignant hyperthermia Neg Hx   . Pseudochol deficiency Neg Hx     Health Maintenance  Topic Date Due  . PAP SMEAR-Modifier  04/26/1978  . OPHTHALMOLOGY EXAM  03/04/2014  . FOOT EXAM  06/20/2015  . MAMMOGRAM  06/25/2018  . COLONOSCOPY  10/09/2019  . HEMOGLOBIN A1C  04/12/2020  . TETANUS/TDAP  03/29/2025  . INFLUENZA VACCINE  Completed  . PNEUMOCOCCAL POLYSACCHARIDE VACCINE AGE 58-64 HIGH RISK  Completed  . Hepatitis C Screening  Completed  . HIV Screening  Completed    ----------------------------------------------------------------------------------------------------------------------------------------------------------------------------------------------------------------- Physical Exam BP (!) 142/88   Pulse 77   Temp 97.8 F (36.6 C) (Temporal)   Ht _0  (1.6 m)   Wt 225 lb 12.8 oz (102.4 kg)   SpO2 97%   BMI 40.00 kg/m   Physical Exam Constitutional:      Appearance: Normal appearance.  HENT:     Head: Normocephalic and atraumatic.  Eyes:     General: No scleral icterus. Cardiovascular:     Rate and Rhythm: Normal rate and regular rhythm.  Pulmonary:     Effort: Pulmonary effort is normal.     Breath sounds: Normal breath sounds.  Musculoskeletal:     Right lower leg: No edema.     Left lower leg: No  edema.  Skin:    General: Skin is warm and dry.  Neurological:     General: No focal deficit present.     Mental Status: She is alert.  Psychiatric:        Mood and Affect: Mood normal.     ------------------------------------------------------------------------------------------------------------------------------------------------------------------------------------------------------------------- Assessment and Plan  Biventricular heart failure ([place]) -Stable symptoms at this time.   She will continue to follow with her cardiologist and heart failure clinic.     Essential hypertension -BP up some today.  Stressed importance of low salt diet.   OSA (obstructive sleep apnea) -Home sleep study scheduled.   Type 2 diabetes mellitus with stage 3 chronic kidney disease, with long-term current use of insulin (H[facility]) -Home blood sugars have improved, update a1c today.  -Encouraged to continue lifestyle changes -She will continue to follow with nephrology for CKD.  Nicotine dependence, cigarettes, uncomplicated -Has been tobacco free x8 weeks.  Congratulated on her accomplishments and encouraged to stay quit.  She feels that chantix is helpful.

## 2019-10-20 NOTE — Assessment & Plan Note (Signed)
-  Has been tobacco free x8 weeks.  Congratulated on her accomplishments and encouraged to stay quit.  She feels that chantix is helpful.

## 2019-10-20 NOTE — Assessment & Plan Note (Addendum)
-  Stable symptoms at this time.   She will continue to follow with her cardiologist and heart failure clinic.

## 2019-10-20 NOTE — Assessment & Plan Note (Signed)
-  Home sleep study scheduled.

## 2019-10-20 NOTE — Telephone Encounter (Signed)
Called and spoke with Patient. Home sleep results and recommendations given.  Understanding stated. Patient instructed to contact office for follow up within 3 months of starting cpap.  DME order placed.  Nothing further at this time.

## 2019-10-20 NOTE — Assessment & Plan Note (Signed)
-  BP up some today.  Stressed importance of low salt diet.

## 2019-10-24 NOTE — Progress Notes (Signed)
Subjective: [name], 63 year old female, returns the office today for painful, elongated, thickened toenails which she cannot trim herself. Denies any redness or drainage around the nails. Denies any acute changes since last appointment and no new complaints today. Denies any systemic complaints such as fevers, chills, nausea, vomiting.   Objective: AAO 3, NAD DP/PT pulses palpable, CRT less than 3 seconds Sensation decreased with SWMF.  Nails hypertrophic, dystrophic, elongated, brittle, discolored 9. There is tenderness overlying the nails 1-5 bilaterally except the left hallux nail which has previously been removed. There is no surrounding erythema or drainage along the nail sites.  No open lesions or pre-ulcerative lesions are identified. No pain with calf compression, swelling, warmth, erythema. Overall doing well without any changes.   Assessment: Patient presents with symptomatic onychomycosis; neuropathy  Plan: -Treatment options including alternatives, risks, complications were discussed -Nails sharply debrided 9 without complication/bleeding. She refuses debridement of left great toe retained spicules. -Discussed daily foot inspection. If there are any changes, to call the office immediately.  -Follow-up in 3 months or sooner if any problems are to arise. In the meantime, encouraged to call the office with any questions, concerns, changes symptoms.  Trula Slade DPM

## 2019-10-27 ENCOUNTER — Inpatient Hospital Stay (HOSPITAL_COMMUNITY)
Admission: RE | Admit: 2019-10-27 | Discharge: 2019-10-27 | Disposition: A | Discharge status: Home / Self Care | Payer: Medicare Other | Source: Ambulatory Visit

## 2019-11-08 NOTE — Progress Notes (Signed)
Virtual Visit via Video Note  I connected with patient on 11/09/19 at  1:00 PM EST by audio enabled telemedicine application and verified that I am speaking with the correct person using two identifiers.   THIS ENCOUNTER IS A VIRTUAL VISIT DUE TO COVID-19 - PATIENT WAS NOT SEEN IN THE OFFICE. PATIENT HAS CONSENTED TO VIRTUAL VISIT / TELEMEDICINE VISIT   Location of patient: home  Location of provider: office  I discussed the limitations of evaluation and management by telemedicine and the availability of in person appointments. The patient expressed understanding and agreed to proceed.   Subjective:   [name] is a 63 y.o. female who presents for an Initial Medicare Annual Wellness Visit.  Review of Systems    Home Safety/Smoke Alarms: Feels safe in home. Smoke alarms in place.   Lives alone in 1 story home.   Female:   Pap- declines       Mammo- scheduled 12/07/19        CCS- 10/08/16. Recall 3 yrs.    Objective:     Advanced Directives 11/09/2019 08/17/2019 08/16/2019 12/29/2018 06/22/2017 06/18/2017 10/24/2016  Does Patient Have a Medical Advance Directive? _0  No No  Would patient like information on creating a medical advance directive? No - Patient declined No - Patient declined Yes (ED - Information included in AVS) No - Patient declined - - No - patient declined information  Pre-existing out of facility DNR order (yellow form or pink MOST form) - - - - - - -    Current Medications (verified) Outpatient Encounter Medications as of 11/09/2019  Medication Sig   acetaminophen (TYLENOL) 325 MG tablet Take 2 tablets (650 mg total) by mouth every 6 (six) hours as needed for mild pain.   allopurinol (ZYLOPRIM) 100 MG tablet Take 100 mg by mouth daily.    aspirin EC 81 MG tablet Take 1 tablet (81 mg total) by mouth daily.   Blood Glucose Monitoring Suppl (ONETOUCH VERIO) w/Device KIT USE TO CHECK BLOOD GLUCOSE 4 TIMES A DAY . DIAGNOSIS: TYPE 2 DIABETES E11.59     busPIRone (BUSPAR) 15 MG tablet Take 15 mg by mouth 2 (two) times daily.    CHANTIX STARTING MONTH PAK 0.5 MG X 11 & 1 MG X 42 tablet TAKE AS DIRECTED ON PACKAGE   empagliflozin (JARDIANCE) 10 MG TABS tablet Take 10 mg by mouth daily before breakfast.   FLUoxetine (PROZAC) 20 MG capsule TAKE 1 CAPSULE (20 MG TOTAL) BY MOUTH AT BEDTIME.   gabapentin (NEURONTIN) 400 MG capsule Take 1 capsule (400 mg total) by mouth at bedtime.   glucose blood (ONETOUCH VERIO) test strip For E11.22   hydrALAZINE (APRESOLINE) 50 MG tablet Take 1 tablet (50 mg total) by mouth every 8 (eight) hours.   isosorbide mononitrate (IMDUR) 60 MG 24 hr tablet Take 1 tablet (60 mg total) by mouth daily. ** DO NOT CRUSH **   KLOR-CON M20 20 MEQ tablet TAKE 1 TABLET BY MOUTH EVERY DAY   losartan (COZAAR) 25 MG tablet Take 0.5 tablets (12.5 mg total) by mouth daily.   omeprazole (PRILOSEC) 40 MG capsule TAKE 1 CAPSULE BY MOUTH 30- 60 MIN BEFORE YOUR FIRST AND LAST MEALS OF THE DAY   rosuvastatin (CRESTOR) 20 MG tablet Take 1 tablet (20 mg total) by mouth daily.   topiramate (TOPAMAX) 25 MG tablet Take 1 tablet (25 mg total) by mouth 2 (two) times daily.   torsemide (DEMADEX) 20 MG tablet Take 1  tablet (20 mg total) by mouth daily.   VENTOLIN HFA 108 (90 Base) MCG/ACT inhaler TAKE 2 PUFFS BY MOUTH EVERY 6 HOURS AS NEEDED FOR WHEEZE OR SHORTNESS OF BREATH   insulin aspart (NOVOLOG FLEXPEN) 100 UNIT/ML FlexPen Inject 5 Units into the skin 3 (three) times daily with meals. (Patient not taking: Reported on 11/09/2019)   Insulin Glargine 300 UNIT/ML SOPN Inject 60 Units into the skin daily. (Patient not taking: Reported on 11/09/2019)   nitroGLYCERIN (NITROSTAT) 0.4 MG SL tablet Place 1 tablet (0.4 mg total) under the tongue every 5 (five) minutes as needed. (Patient not taking: Reported on 11/09/2019)   No facility-administered encounter medications on file as of 11/09/2019.     Allergies (verified) Atorvastatin,  Ibuprofen, Metformin and related, Ace inhibitors, Hydrocodone-acetaminophen, and Albuterol   History: Past Medical History:  Diagnosis Date   Anxiety    doesn't take any meds for this   Arteriosclerosis of coronary artery 08/31/2011   Myoview 9/12: EF 44, no ischemia // Myoview 8/18: EF 57, low risk, possible small area of inferior ischemia // Minor nonobstructive CAD by cardiac catheterization - [facility] 8/18: Proximal LAD 35, OM2 25   Arthritis    knuckles   Asthma    Carotid artery disease ([facility])    Carotid US 7/14: Bilateral ICA 40-59 // Carotid US ([facility]) 2/17: bilat plaque without sig stenosis   Chronic diastolic CHF (congestive heart failure) ([facility]) 07/26/2017   Echo 7/18:Severe conc LVH, EF 60-65, no RWMA, Gr 2 DD, MAC, mild MR // right and left heart cath 8/18: LVEDP 21   Claustrophobia 07/26/2012   COPD (chronic obstructive pulmonary disease) ([facility])    "chronic bronchitis - about every winter"   CSF leak    dizziness/headache - assoc symptoms // 2/2 encephalocele s/p skull base reconstruction   Diverticulosis    History of CVA (cerebrovascular accident) 03/2004   denies residual 07/26/2012   Hx of gout    Hyperlipidemia    takes Pravastatin daily   Hypertensive heart disease with chronic diastolic congestive heart failure ([facility]) 07/26/2017   Internal hemorrhoids    Kidney stones    LVH (left ventricular hypertrophy)    Echo 8/18: Severe conc LVH, EF 60-65, no RWMA, Gr 2 DD, MAC, mild MR   OSA (obstructive sleep apnea) 07/26/2012   prior CPAP - stopped due to being primary caregiver for dying parent (stopped in 2016)   PSVT (paroxysmal supraventricular tachycardia) ([facility])    Pulmonary hypertension, unspecified ([facility]) 08/14/2017   cath 07/2017 >>PASP 13m Hg, PA mean 39 mm Hg   Type II diabetes mellitus ([facility]    Past Surgical History:  Procedure Laterality Date   ESOPHAGOGASTRODUODENOSCOPY (EGD) WITH PROPOFOL N/A 07/13/2015   Procedure: ESOPHAGOGASTRODUODENOSCOPY  (EGD) WITH PROPOFOL;  Surgeon: PCarol Ada MD;  Location: [facility];  Service: Endoscopy;  Laterality: N/A;   NASAL SINUS SURGERY  01/29/2012   Procedure: ENDOSCOPIC SINUS SURGERY WITH STEALTH;  Surgeon: MRuby Cola MD;  [facility]  Service: ENT;  Laterality: N/A;   NM MYOVIEW LTD  08/25/2011   Low risk study, no evidence of ischemia, EF 44%   REFRACTIVE SURGERY  ~ 2010   left   RIGHT/LEFT HEART CATH AND CORONARY ANGIOGRAPHY N/A 07/30/2017   Procedure: RIGHT/LEFT HEART CATH AND CORONARY ANGIOGRAPHY;  Surgeon: JMartinique Peter M, MD;  [facility];  Service: Cardiovascular;  Laterality: N/A;   SPHENOIDECTOMY  01/29/2012   Procedure: SPHENOIDECTOMY;  Surgeon: MRuby Cola MD;  [facility];  Service: ENT;  Laterality: Left;  REPAIR OF LEFT SPHENOID     TOTAL ABDOMINAL HYSTERECTOMY  1994   Family History  Problem Relation Age of Onset   Colon cancer Sister 87   Breast cancer Mother 70   Alzheimer's disease Mother    Diabetes Sister 63   Heart failure Sister    Diabetes Sister 24   Lung cancer Father    Breast cancer Sister    Cancer Sister        mouth   Anesthesia problems Neg Hx    Hypotension Neg Hx    Malignant hyperthermia Neg Hx    Pseudochol deficiency Neg Hx    Social History   Socioeconomic History   Marital status: Single    Spouse name: Not on file   Number of children: 0   Years of education: Not on file   Highest education level: Not on file  Occupational History   Occupation: disabled  Social Designer, fashion/clothing strain: Not on file   Food insecurity    Worry: Not on file    Inability: Not on file   Transportation needs    Medical: Not on file    Non-medical: Not on file  Tobacco Use   Smoking status: Former Smoker    Packs/day: 0.50    Years: 35.00    Pack years: 17.50    Types: Cigarettes    Quit date: 08/16/2019    Years since quitting: 0.2   Smokeless tobacco: Never Used  Substance  and Sexual Activity   Alcohol use: Yes    Alcohol/week: 1.0 standard drinks    Types: 1 Glasses of wine per week    Comment: rarely   Drug use: No   Sexual activity: Never    Birth control/protection: Surgical  Lifestyle   Physical activity    Days per week: Not on file    Minutes per session: Not on file   Stress: Not on file  Relationships   Social connections    Talks on phone: Not on file    Gets together: Not on file    Attends religious service: Not on file    Active member of club or organization: Not on file    Attends meetings of clubs or organizations: Not on file    Relationship status: Not on file  Other Topics Concern   Not on file  Social History Narrative   Currently unemployed.   No children, not married    Tobacco Counseling Counseling given: Not Answered   Clinical Intake: Pain : No/denies pain    Activities of Daily Living In your present state of health, do you have any difficulty performing the following activities: 11/09/2019 08/17/2019  Hearing? N N  Vision? N N  Difficulty concentrating or making decisions? N N  Walking or climbing stairs? N N  Dressing or bathing? N N  Doing errands, shopping? N N  Preparing Food and eating ? N -  Using the Toilet? N -  In the past six months, have you accidently leaked urine? N -  Do you have problems with loss of bowel control? N -  Managing your Medications? N -  Managing your Finances? N -  Housekeeping or managing your Housekeeping? N -  Some recent data might be hidden     Immunizations and Health Maintenance Immunization History  Administered Date(s) Administered   Influenza Split 10/21/2012, 10/10/2014   Influenza, Seasonal, Injecte, Preservative Fre 01/10/2016  Influenza,inj,Quad PF,6+ Mos 09/19/2013, 09/14/2017, 11/18/2018, 10/14/2019   Influenza,inj,quad, With Preservative 10/13/2016   Influenza-Unspecified 09/26/2014   Meningococcal Polysaccharide 02/04/2012   Pneumococcal  Conjugate-13 09/26/2014   Pneumococcal Polysaccharide-23 01/10/2016   Tdap 03/04/2012, 03/30/2015   Health Maintenance Due  Topic Date Due   PAP SMEAR-Modifier  04/26/1978   OPHTHALMOLOGY EXAM  03/04/2014   FOOT EXAM  06/20/2015   MAMMOGRAM  06/25/2018   COLONOSCOPY  10/09/2019    Patient Care Team: Luetta Nutting, DO as PCP - General (Family Medicine) Fay Records, MD as PCP - Cardiology (Cardiology)  Indicate any recent Medical Services you may have received from other than [facility] providers in the past year (date may be approximate).     Assessment:   This is a routine wellness examination for Shondell. Physical assessment deferred to PCP.  Hearing/Vision screen Unable to assess. This visit is enabled though telemedicine due to Covid 19.   Dietary issues and exercise activities discussed: Current Exercise Habits: The patient does not participate in regular exercise at present;Home exercise routine, Exercise limited by: None identified Diet (meal preparation, eat out, water intake, caffeinated beverages, dairy products, fruits and vegetables): in general, an "unhealthy" diet       Goals     Blood Pressure < 130/80     HEMOGLOBIN A1C < 8.0     Quit smoking / using tobacco      Depression Screen PHQ 2/9 Scores 11/09/2019 11/27/2013 11/24/2013 03/04/2012  PHQ - 2 Score 0 5 5 0  PHQ- 9 Score - 20 - -    Fall Risk Fall Risk  11/09/2019  Falls in the past year? 1  Number falls in past yr: 1  Injury with Fall? 0  Follow up Education provided;Falls prevention discussed     Cognitive Function: Ad8 score reviewed for issues:  Issues making decisions:no  Less interest in hobbies / activities:no  Repeats questions, stories (family complaining):no  Trouble using ordinary gadgets (microwave, computer, phone):no  Forgets the month or year: no  Mismanaging finances: no  Remembering appts:no  Daily problems with thinking and/or memory:no Ad8 score is=0          Screening Tests Health Maintenance  Topic Date Due   PAP SMEAR-Modifier  04/26/1978   OPHTHALMOLOGY EXAM  03/04/2014   FOOT EXAM  06/20/2015   MAMMOGRAM  06/25/2018   COLONOSCOPY  10/09/2019   HEMOGLOBIN A1C  04/12/2020   TETANUS/TDAP  03/29/2025   INFLUENZA VACCINE  Completed   PNEUMOCOCCAL POLYSACCHARIDE VACCINE AGE 26-64 HIGH RISK  Completed   Hepatitis C Screening  Completed   HIV Screening  Completed    Plan:    Please schedule your next medicare wellness visit with me in 1 yr.  Continue to eat heart healthy diet (full of fruits, vegetables, whole grains, lean protein, water--limit salt, fat, and sugar intake) and increase physical activity as tolerated.  Continue doing brain stimulating activities (puzzles, reading, adult coloring books, staying active) to keep memory sharp.     I have personally reviewed and noted the following in the patients chart:    Medical and social history  Use of alcohol, tobacco or illicit drugs   Current medications and supplements  Functional ability and status  Nutritional status  Physical activity  Advanced directives  List of other physicians  Hospitalizations, surgeries, and ER visits in previous 12 months  Vitals  Screenings to include cognitive, depression, and falls  Referrals and appointments  In addition, I have reviewed and discussed  with patient certain preventive protocols, quality metrics, and best practice recommendations. A written personalized care plan for preventive services as well as general preventive health recommendations were provided to patient.     Shela Nevin, [place]   11/09/2019

## 2019-11-09 ENCOUNTER — Other Ambulatory Visit: Payer: Self-pay | Admitting: Family Medicine

## 2019-11-09 ENCOUNTER — Other Ambulatory Visit: Payer: Self-pay

## 2019-11-09 ENCOUNTER — Encounter: Payer: Self-pay | Admitting: *Deleted

## 2019-11-09 ENCOUNTER — Ambulatory Visit (INDEPENDENT_AMBULATORY_CARE_PROVIDER_SITE_OTHER): Payer: Medicare Other | Admitting: *Deleted

## 2019-11-09 ENCOUNTER — Ambulatory Visit: Payer: Medicare Other | Admitting: Family Medicine

## 2019-11-09 ENCOUNTER — Encounter: Payer: Self-pay | Admitting: Family Medicine

## 2019-11-09 VITALS — Ht 63.0 in

## 2019-11-09 DIAGNOSIS — Z794 Long term (current) use of insulin: Secondary | ICD-10-CM

## 2019-11-09 DIAGNOSIS — Z0000 Encounter for general adult medical examination without abnormal findings: Secondary | ICD-10-CM | POA: Diagnosis not present

## 2019-11-09 DIAGNOSIS — N183 Type 2 diabetes mellitus with diabetic chronic kidney disease: Secondary | ICD-10-CM

## 2019-11-09 MED ORDER — ONETOUCH VERIO W/DEVICE KIT: PACK | 0 refills | Status: AC

## 2019-11-09 MED ORDER — ONETOUCH VERIO VI STRP: ORAL_STRIP | 12 refills | Status: DC

## 2019-11-09 MED ORDER — ONETOUCH VERIO VI STRP: ORAL_STRIP | 12 refills | Status: AC

## 2019-11-09 NOTE — Progress Notes (Signed)
Patient prefers not to have visit today.  Just needs new glucometer rx.  She has plenty of insulin.  Rx for onetouch verio and strips sent in.  She states that cough is chronic and she will f/u with Dr. Valeta Harms for this.

## 2019-11-09 NOTE — Patient Instructions (Signed)
Please schedule your next medicare wellness visit with me in 1 yr.  Continue to eat heart healthy diet (full of fruits, vegetables, whole grains, lean protein, water--limit salt, fat, and sugar intake) and increase physical activity as tolerated.  Continue doing brain stimulating activities (puzzles, reading, adult coloring books, staying active) to keep memory sharp.    [name] , Thank you for taking time to come for your Medicare Wellness Visit. I appreciate your ongoing commitment to your health goals. Please review the following plan we discussed and let me know if I can assist you in the future.   These are the goals we discussed: Goals    . Blood Pressure < 130/80    . HEMOGLOBIN A1C < 8.0    . Quit smoking / using tobacco       This is a list of the screening recommended for you and due dates:  Health Maintenance  Topic Date Due  . Pap Smear  04/26/1978  . Eye exam for diabetics  03/04/2014  . Complete foot exam   06/20/2015  . Mammogram  06/25/2018  . Colon Cancer Screening  10/09/2019  . Hemoglobin A1C  04/12/2020  . Tetanus Vaccine  03/29/2025  . Flu Shot  Completed  . Pneumococcal vaccine  Completed  .  Hepatitis C: One time screening is recommended by Center for Disease Control  (CDC) for  adults born from [birth year] through 1964.   Completed  . HIV Screening  Completed    Preventive Care 29-88 Years Old, Female Preventive care refers to visits with your health care provider and lifestyle choices that can promote health and wellness. This includes:  A yearly physical exam. This may also be called an annual well check.  Regular dental visits and eye exams.  Immunizations.  Screening for certain conditions.  Healthy lifestyle choices, such as eating a healthy diet, getting regular exercise, not using drugs or products that contain nicotine and tobacco, and limiting alcohol use. What can I expect for my preventive care visit? Physical exam Your health care  provider will check your:  Height and weight. This may be used to calculate body mass index (BMI), which tells if you are at a healthy weight.  Heart rate and blood pressure.  Skin for abnormal spots. Counseling Your health care provider may ask you questions about your:  Alcohol, tobacco, and drug use.  Emotional well-being.  Home and relationship well-being.  Sexual activity.  Eating habits.  Work and work Statistician.  Method of birth control.  Menstrual cycle.  Pregnancy history. What immunizations do I need?  Influenza (flu) vaccine  This is recommended every year. Tetanus, diphtheria, and pertussis (Tdap) vaccine  You may need a Td booster every 10 years. Varicella (chickenpox) vaccine  You may need this if you have not been vaccinated. Zoster (shingles) vaccine  You may need this after age 32. Measles, mumps, and rubella (MMR) vaccine  You may need at least one dose of MMR if you were born in 1957 or later. You may also need a second dose. Pneumococcal conjugate (PCV13) vaccine  You may need this if you have certain conditions and were not previously vaccinated. Pneumococcal polysaccharide (PPSV23) vaccine  You may need one or two doses if you smoke cigarettes or if you have certain conditions. Meningococcal conjugate (MenACWY) vaccine  You may need this if you have certain conditions. Hepatitis A vaccine  You may need this if you have certain conditions or if you travel or work  in places where you may be exposed to hepatitis A. Hepatitis B vaccine  You may need this if you have certain conditions or if you travel or work in places where you may be exposed to hepatitis B. Haemophilus influenzae type b (Hib) vaccine  You may need this if you have certain conditions. Human papillomavirus (HPV) vaccine  If recommended by your health care provider, you may need three doses over 6 months. You may receive vaccines as individual doses or as more than  one vaccine together in one shot (combination vaccines). Talk with your health care provider about the risks and benefits of combination vaccines. What tests do I need? Blood tests  Lipid and cholesterol levels. These may be checked every 5 years, or more frequently if you are over 35 years old.  Hepatitis C test.  Hepatitis B test. Screening  Lung cancer screening. You may have this screening every year starting at age 64 if you have a 30-pack-year history of smoking and currently smoke or have quit within the past 15 years.  Colorectal cancer screening. All adults should have this screening starting at age 55 and continuing until age 66. Your health care provider may recommend screening at age 36 if you are at increased risk. You will have tests every 1-10 years, depending on your results and the type of screening test.  Diabetes screening. This is done by checking your blood sugar (glucose) after you have not eaten for a while (fasting). You may have this done every 1-3 years.  Mammogram. This may be done every 1-2 years. Talk with your health care provider about when you should start having regular mammograms. This may depend on whether you have a family history of breast cancer.  BRCA-related cancer screening. This may be done if you have a family history of breast, ovarian, tubal, or peritoneal cancers.  Pelvic exam and Pap test. This may be done every 3 years starting at age 39. Starting at age 63, this may be done every 5 years if you have a Pap test in combination with an HPV test. Other tests  Sexually transmitted disease (STD) testing.  Bone density scan. This is done to screen for osteoporosis. You may have this scan if you are at high risk for osteoporosis. Follow these instructions at home: Eating and drinking  Eat a diet that includes fresh fruits and vegetables, whole grains, lean protein, and low-fat dairy.  Take vitamin and mineral supplements as recommended by your  health care provider.  Do not drink alcohol if: ? Your health care provider tells you not to drink. ? You are pregnant, may be pregnant, or are planning to become pregnant.  If you drink alcohol: ? Limit how much you have to 0-1 drink a day. ? Be aware of how much alcohol is in your drink. In the [place] one drink equals one 12 oz bottle of beer (355 mL), one 5 oz glass of wine (148 mL), or one 1 oz glass of hard liquor (44 mL). Lifestyle  Take daily care of your teeth and gums.  Stay active. Exercise for at least 30 minutes on 5 or more days each week.  Do not use any products that contain nicotine or tobacco, such as cigarettes, e-cigarettes, and chewing tobacco. If you need help quitting, ask your health care provider.  If you are sexually active, practice safe sex. Use a condom or other form of birth control (contraception) in order to prevent pregnancy and STIs (sexually transmitted  infections).  If told by your health care provider, take low-dose aspirin daily starting at age 16. What's next?  Visit your health care provider once a year for a well check visit.  Ask your health care provider how often you should have your eyes and teeth checked.  Stay up to date on all vaccines. This information is not intended to replace advice given to you by your health care provider. Make sure you discuss any questions you have with your health care provider. Document Released: 12/21/2015 Document Revised: 08/05/2018 Document Reviewed: 08/05/2018 Elsevier Patient Education  2020 [employer].

## 2019-11-11 NOTE — Progress Notes (Signed)
PCP: Primary Cardiologist: Dr Harrington Challenger HF MD; Dr Haroldine Laws  HPI:  Angel Pinegar Sprinkleis a 63 y.o.femalewith a hx ofmorbid obesity,chronic systolic/diastolic CHF, PSVT, prior CVA, HTN, DM, OSA,CKD with baseline creatinine 1.3-1.4,COPDwith ongoing tobacco, pulmonary hypertensionand CSF rhinorrhea s/p skull base reconstruction.   Admitted with A/C diastolic heart failure. > 30 poundweight gain in a few weeks after her PCP discontinued lasix 06/27/2019 secondary to worsening kidney function. In ER markedly volume overloaded. [name] was placed to guide therapy with initial swan numbers showing cardiac index 1.6 and elevated filling pressures. ECHO completed and showed biventricular HF with LVEF 25-30 and RV severely reduced. Myeloma panel- no M spike andPYPscan negative for amyloid. She was placed on milrinone and diuresed with IV lasix. As she improved, milrinone was weaned off. HF medications adjusted. Discharge weight was 236 pounds.   Recent presentation to HF Clinic for pharmacist medication titration on 10/13/2019. She reported doing very well at that visit. She noted occasional dizziness after taking medications, but this was not bothersome and was infrequent. No chest pain. She reported SOB with mild activity but felt this had improved. She can walk halfway around the track without stopping. Her weight at home had been stable at 225-227 pounds. She felt she was at her dry weight. She takes torsemide 20 mg daily and had not needed any extra. No LEE, PND or orthopea. Her appetite was good. She reported making many positive changes since discharge, including no longer eating fast food and no longer eating canned goods, just fresh or frozen vegetables. She quit smoking.   Today she returns to HF clinic for pharmacist medication titration. At recent visits to HF Clinic, hydralazine was increased to 50 mg TID and Jardiance 10 mg daily was started. Overall she is feeling very well today. She is  celebrating 3 months of smoking cessation. No dizziness, lightheadedness or chest pains. She does have palpitations 2-3x/day. These only last a few seconds. No SOB/DOE unless going up stairs. She does note that she is coughing more and believes it is related to quitting smoking. Will have her follow-up with her pulmonologist. Her weight has been stable at 224-227 lbs at home. No LEE, PND or orthopnea. Her hands sometimes swell, but this seems to be related more to arthritis than excess fluid. Her appetite is good. She continues to follow a low sodium diet and fluid restriction. Her BP is elevated at 146/88 today, but she did not take her medications yet.   Marland Kitchen Shortness of breath/dyspnea on exertion? no . Orthopnea/PND? no . Edema? no . Lightheadedness/dizziness? No . Daily weights at home? Yes; 225-227 lbs. Weighs herself religiously at 11:30 AM daily.  . Blood pressure/heart rate monitoring at home? no . Following low-sodium/fluid-restricted diet? Yes - has made a lot of positive diet changes (see above).   HF Medications: Losartan 12.5 mg daily Hydralazine 50 mg TID Imdur 60 mg daily Jardiance 10 mg daily Torsemide 20 mg daily Potassium chloride 20 mEq daily  Has the patient been experiencing any side effects to the medications prescribed?  no  Does the patient have any problems obtaining medications due to transportation or finances?   No - has Pharmacist, community  Understanding of regimen: fair Understanding of indications: fair Potential of compliance: good Patient understands to avoid NSAIDs. Patient understands to avoid decongestants.    Pertinent Lab Values  Serum creatinine 1.94, BUN 29, Potassium 4.4, Sodium 142, BNP 486.7, GFR 27. Protein/Creat ratio: 4482 (H)  Vital Signs: . Weight: 225.4 (last clinic  weight: 226.6) . Blood pressure: 140/86  . Heart rate: 76   Assessment: 2. Chronic biventricular HF  - echo EF 25-30%. RV severely HK, NICM  - Cath 2018 with minimal  CAD - recent Myeloma panel- no M spike andPYPScan negative for amyloid. - NYHA II-III.  - Volume status stable. Euvolemic on exam. Dry weight 224-227 lbs.  - Vitals: BP elevated at 146/88, HR 68. Had not taken medications yet today.  - Labs reviewed and discussed with patient: Scr increased from 1.53 to 1.94. K stable at 4.4. BNP improved from 889.6 to 486.7.  - Given Scr increase she is likely dry. Will have her decrease torsemide to 20 mg MWF. Repeat BMET in clinic next week.  - Continue low dose losartan 12.5 mg daily. Holding off on increasing ARB and initiating spironolactone w/ recent elevated SCr. Will have her discontinue potassium supplements. Hopefully will be able to start low-dose spironolactone once Scr returns to baseline.  - Avoiding ? blocker for now given low CO on recent cath. As she improves, would consider trial of bisoprolol given her COPD.  - Continue hydralazine 50 mgthree times a day  - Continue Imdur 60 mg daily - Continue Jardiance 10 mg daily for benefits in HF, T2DM and CKD.  - Continue daily wts and low sodium diet   3. Chronic hypoxic respiratory failure - multifactorial - Sats stable on room air.    4. PAH with /cor pulmonale - likely who GROUP II (left heart dz) & III (COPD and OSA) - Has sleep apnea but has not used CPAP.  -Sleep specialist recommended sleep study evaluation.   5. CKE Stage III - baseline creatinine 1.4-1.7 - BMET stable on last visit SCr 1.5. Scr increased to 1.94 today. Likely dry after starting Jardiance.  - Decrease torsemide to 20 mg MWF as above. - Continue Jardiance 10 mg daily.   6. COPD with ongoing tobacco use - She has not smoked since discharge. She was congratulated on her efforts. Celebrating 3 months of cessation today.  7. Morbid obesity Body mass index is 41.74 kg/m. -She has made lifestyle modifications. She is exercising and eating better. Encouraged to continue on this path  8. OSA - Home sleep study  confirmed need for CPAP. She will call today to discuss when she can get her machine.   9. DM2 -Hemoglobin A1C 6.6% (10/14/19) - Continue Lantus 60 units daily and Novolog 5 units TID.  - Continue Jardiance 10 mg daily for benefits in HF, T2DM and CKD.  - Continue rosuvastatin 20 mg daily.   Plan: 1) Medication changes: Based on clinical presentation, vital signs and recent labs will decrease torsemide to 20 mg MWF and stop potassium chloride supplements.  2) Labs: Scr increased to 1.94, K 4.4 3) Follow-up: HF Clinic with Dr. Haroldine Laws in 1 week.    Audry Riles, PharmD, BCPS, CPP Heart Failure Clinic Pharmacist [phone]

## 2019-11-15 ENCOUNTER — Ambulatory Visit (HOSPITAL_COMMUNITY)
Admission: RE | Admit: 2019-11-15 | Discharge: 2019-11-15 | Disposition: A | Discharge status: Home / Self Care | Payer: Medicare Other | Source: Ambulatory Visit | Attending: Cardiology | Admitting: Cardiology

## 2019-11-15 ENCOUNTER — Other Ambulatory Visit: Payer: Self-pay

## 2019-11-15 VITALS — BP 146/88 | HR 68 | Wt 225.4 lb

## 2019-11-15 DIAGNOSIS — I428 Other cardiomyopathies: Secondary | ICD-10-CM | POA: Insufficient documentation

## 2019-11-15 DIAGNOSIS — Z79899 Other long term (current) drug therapy: Secondary | ICD-10-CM | POA: Diagnosis not present

## 2019-11-15 DIAGNOSIS — Z87891 Personal history of nicotine dependence: Secondary | ICD-10-CM | POA: Diagnosis not present

## 2019-11-15 DIAGNOSIS — E1122 Type 2 diabetes mellitus with diabetic chronic kidney disease: Secondary | ICD-10-CM | POA: Diagnosis not present

## 2019-11-15 DIAGNOSIS — Z794 Long term (current) use of insulin: Secondary | ICD-10-CM | POA: Insufficient documentation

## 2019-11-15 DIAGNOSIS — I5032 Chronic diastolic (congestive) heart failure: Secondary | ICD-10-CM

## 2019-11-15 DIAGNOSIS — I5022 Chronic systolic (congestive) heart failure: Secondary | ICD-10-CM

## 2019-11-15 DIAGNOSIS — I130 Hypertensive heart and chronic kidney disease with heart failure and stage 1 through stage 4 chronic kidney disease, or unspecified chronic kidney disease: Secondary | ICD-10-CM | POA: Insufficient documentation

## 2019-11-15 DIAGNOSIS — J449 Chronic obstructive pulmonary disease, unspecified: Secondary | ICD-10-CM | POA: Insufficient documentation

## 2019-11-15 DIAGNOSIS — N183 Chronic kidney disease, stage 3 unspecified: Secondary | ICD-10-CM | POA: Diagnosis not present

## 2019-11-15 DIAGNOSIS — Z6841 Body Mass Index (BMI) 40.0 and over, adult: Secondary | ICD-10-CM | POA: Diagnosis not present

## 2019-11-15 DIAGNOSIS — G4733 Obstructive sleep apnea (adult) (pediatric): Secondary | ICD-10-CM | POA: Diagnosis not present

## 2019-11-15 DIAGNOSIS — Z8673 Personal history of transient ischemic attack (TIA), and cerebral infarction without residual deficits: Secondary | ICD-10-CM | POA: Insufficient documentation

## 2019-11-15 DIAGNOSIS — I5042 Chronic combined systolic (congestive) and diastolic (congestive) heart failure: Secondary | ICD-10-CM | POA: Insufficient documentation

## 2019-11-15 DIAGNOSIS — I2510 Atherosclerotic heart disease of native coronary artery without angina pectoris: Secondary | ICD-10-CM | POA: Insufficient documentation

## 2019-11-15 DIAGNOSIS — J9611 Chronic respiratory failure with hypoxia: Secondary | ICD-10-CM | POA: Diagnosis not present

## 2019-11-15 LAB — BASIC METABOLIC PANEL
Anion gap: 12 (ref 5–15)
BUN: 29 mg/dL — ABNORMAL HIGH (ref 8–23)
CO2: 25 mmol/L (ref 22–32)
Calcium: 8.6 mg/dL — ABNORMAL LOW (ref 8.9–10.3)
Chloride: 105 mmol/L (ref 98–111)
Creatinine, Ser: 1.94 mg/dL — ABNORMAL HIGH (ref 0.44–1.00)
GFR calc Af Amer: 31 mL/min — ABNORMAL LOW (ref >60)
GFR calc non Af Amer: 27 mL/min — ABNORMAL LOW (ref >60)
Glucose, Bld: 227 mg/dL — ABNORMAL HIGH (ref 70–99)
Potassium: 4.4 mmol/L (ref 3.5–5.1)
Sodium: 142 mmol/L (ref 135–145)

## 2019-11-15 LAB — BRAIN NATRIURETIC PEPTIDE: B Natriuretic Peptide: 486.7 pg/mL — ABNORMAL HIGH (ref 0.0–100.0)

## 2019-11-15 MED ORDER — TORSEMIDE 20 MG PO TABS: 20.0000 mg | ORAL_TABLET | ORAL | 0 refills | Status: DC

## 2019-11-15 MED ORDER — SPIRONOLACTONE 25 MG PO TABS: 12.5000 mg | ORAL_TABLET | Freq: Every day | ORAL | 3 refills | Status: DC

## 2019-11-15 MED ORDER — POTASSIUM CHLORIDE CRYS ER 20 MEQ PO TBCR: EXTENDED_RELEASE_TABLET | ORAL | 0 refills | Status: DC

## 2019-11-15 NOTE — Patient Instructions (Signed)
It was a pleasure seeing you today!  MEDICATIONS: -We are changing your medications today -Stop taking Klor-Con (potassium) -Start taking spironolactone 12.5 mg (1/2 tab) daily -Call if you have questions about your medications.  LABS: -We will call you if your labs need attention.  NEXT APPOINTMENT: Return to clinic in 1 week with Dr. Haroldine Laws.  In general, to take care of your heart failure: -Limit your fluid intake to 2 Liters (half-gallon) per day.   -Limit your salt intake to ideally 2-3 grams (2000-3000 mg) per day. -Weigh yourself daily and record, and bring that "weight diary" to your next appointment.  (Weight gain of 2-3 pounds in 1 day typically means fluid weight.) -The medications for your heart are to help your heart and help you live longer.   -Please contact us before stopping any of your heart medications.  Call the clinic at [phone] with questions or to reschedule future appointments.

## 2019-11-18 ENCOUNTER — Other Ambulatory Visit (HOSPITAL_COMMUNITY): Payer: Medicare Other

## 2019-11-25 ENCOUNTER — Encounter (HOSPITAL_COMMUNITY): Payer: Medicare Other | Admitting: Internal Medicine

## 2019-11-25 ENCOUNTER — Telehealth: Payer: Self-pay | Admitting: Pulmonary Disease

## 2019-11-25 NOTE — Telephone Encounter (Signed)
Dr. Ander Slade,  The message below has been routed to your attention. This patient was last seen by you on 10/05/19. The order was sent to the DME 10/20/19.   If the order ends up cancelled, did you want to discuss with her at her next visit?

## 2019-11-28 NOTE — Telephone Encounter (Signed)
Yes, I will be glad to follow-up with her   Let us reach out to the patient to figure out why she is not accepting DME calls?

## 2019-11-28 NOTE — Telephone Encounter (Signed)
I called the patient back and she stated she just got her phone fixed on Friday. I told her the DME was trying to reach her about CPAP order that was placed.  Patient confirmed she still wanted the CPAP and for them to please not cancel the order.  I called [facility] and spoke with Olivia Mackie. She stated she talked to the patient on Friday, the patient told her that she could not get the CPAP until January because of finances. The machine will be ordered for the patient. Nothing further needed at this time.

## 2019-11-29 ENCOUNTER — Other Ambulatory Visit: Payer: Self-pay

## 2019-11-29 ENCOUNTER — Encounter (HOSPITAL_COMMUNITY): Payer: Self-pay | Admitting: Internal Medicine

## 2019-11-29 ENCOUNTER — Ambulatory Visit (HOSPITAL_COMMUNITY)
Admission: RE | Admit: 2019-11-29 | Discharge: 2019-11-29 | Disposition: A | Discharge status: Home / Self Care | Payer: Medicare Other | Source: Ambulatory Visit | Attending: Internal Medicine | Admitting: Internal Medicine

## 2019-11-29 VITALS — BP 143/59 | HR 72 | Wt 225.2 lb

## 2019-11-29 DIAGNOSIS — Z87891 Personal history of nicotine dependence: Secondary | ICD-10-CM | POA: Diagnosis not present

## 2019-11-29 DIAGNOSIS — Z8249 Family history of ischemic heart disease and other diseases of the circulatory system: Secondary | ICD-10-CM | POA: Insufficient documentation

## 2019-11-29 DIAGNOSIS — I5022 Chronic systolic (congestive) heart failure: Secondary | ICD-10-CM

## 2019-11-29 DIAGNOSIS — Z833 Family history of diabetes mellitus: Secondary | ICD-10-CM | POA: Insufficient documentation

## 2019-11-29 DIAGNOSIS — N183 Chronic kidney disease, stage 3 unspecified: Secondary | ICD-10-CM | POA: Insufficient documentation

## 2019-11-29 DIAGNOSIS — I2510 Atherosclerotic heart disease of native coronary artery without angina pectoris: Secondary | ICD-10-CM | POA: Diagnosis not present

## 2019-11-29 DIAGNOSIS — Z803 Family history of malignant neoplasm of breast: Secondary | ICD-10-CM | POA: Diagnosis not present

## 2019-11-29 DIAGNOSIS — I5082 Biventricular heart failure: Secondary | ICD-10-CM | POA: Insufficient documentation

## 2019-11-29 DIAGNOSIS — J449 Chronic obstructive pulmonary disease, unspecified: Secondary | ICD-10-CM | POA: Insufficient documentation

## 2019-11-29 DIAGNOSIS — Z87442 Personal history of urinary calculi: Secondary | ICD-10-CM | POA: Insufficient documentation

## 2019-11-29 DIAGNOSIS — Z6839 Body mass index (BMI) 39.0-39.9, adult: Secondary | ICD-10-CM | POA: Diagnosis not present

## 2019-11-29 DIAGNOSIS — I272 Pulmonary hypertension, unspecified: Secondary | ICD-10-CM | POA: Diagnosis not present

## 2019-11-29 DIAGNOSIS — E785 Hyperlipidemia, unspecified: Secondary | ICD-10-CM | POA: Insufficient documentation

## 2019-11-29 DIAGNOSIS — J9611 Chronic respiratory failure with hypoxia: Secondary | ICD-10-CM | POA: Diagnosis not present

## 2019-11-29 DIAGNOSIS — Z7982 Long term (current) use of aspirin: Secondary | ICD-10-CM | POA: Diagnosis not present

## 2019-11-29 DIAGNOSIS — Z79899 Other long term (current) drug therapy: Secondary | ICD-10-CM | POA: Diagnosis not present

## 2019-11-29 DIAGNOSIS — I130 Hypertensive heart and chronic kidney disease with heart failure and stage 1 through stage 4 chronic kidney disease, or unspecified chronic kidney disease: Secondary | ICD-10-CM | POA: Insufficient documentation

## 2019-11-29 DIAGNOSIS — Z794 Long term (current) use of insulin: Secondary | ICD-10-CM | POA: Insufficient documentation

## 2019-11-29 DIAGNOSIS — I5042 Chronic combined systolic (congestive) and diastolic (congestive) heart failure: Secondary | ICD-10-CM | POA: Diagnosis not present

## 2019-11-29 DIAGNOSIS — E1122 Type 2 diabetes mellitus with diabetic chronic kidney disease: Secondary | ICD-10-CM | POA: Insufficient documentation

## 2019-11-29 DIAGNOSIS — I428 Other cardiomyopathies: Secondary | ICD-10-CM | POA: Insufficient documentation

## 2019-11-29 DIAGNOSIS — Z801 Family history of malignant neoplasm of trachea, bronchus and lung: Secondary | ICD-10-CM | POA: Diagnosis not present

## 2019-11-29 DIAGNOSIS — Z800 Family history of malignant neoplasm of digestive organs: Secondary | ICD-10-CM | POA: Diagnosis not present

## 2019-11-29 DIAGNOSIS — Z8673 Personal history of transient ischemic attack (TIA), and cerebral infarction without residual deficits: Secondary | ICD-10-CM | POA: Insufficient documentation

## 2019-11-29 DIAGNOSIS — G4733 Obstructive sleep apnea (adult) (pediatric): Secondary | ICD-10-CM | POA: Diagnosis not present

## 2019-11-29 LAB — BASIC METABOLIC PANEL
Anion gap: 12 (ref 5–15)
BUN: 24 mg/dL — ABNORMAL HIGH (ref 8–23)
CO2: 26 mmol/L (ref 22–32)
Calcium: 9.3 mg/dL (ref 8.9–10.3)
Chloride: 104 mmol/L (ref 98–111)
Creatinine, Ser: 1.66 mg/dL — ABNORMAL HIGH (ref 0.44–1.00)
GFR calc Af Amer: 38 mL/min — ABNORMAL LOW (ref >60)
GFR calc non Af Amer: 33 mL/min — ABNORMAL LOW (ref >60)
Glucose, Bld: 156 mg/dL — ABNORMAL HIGH (ref 70–99)
Potassium: 4.3 mmol/L (ref 3.5–5.1)
Sodium: 142 mmol/L (ref 135–145)

## 2019-11-29 LAB — BRAIN NATRIURETIC PEPTIDE: B Natriuretic Peptide: 660.1 pg/mL — ABNORMAL HIGH (ref 0.0–100.0)

## 2019-11-29 MED ORDER — ENTRESTO 24-26 MG PO TABS: 1.0000 | ORAL_TABLET | Freq: Two times a day (BID) | ORAL | 6 refills | Status: DC

## 2019-11-29 NOTE — Addendum Note (Signed)
Encounter addended by: Scarlette Calico, RN on: 11/29/2019 11:00 AM  Actions taken: Pharmacy for encounter modified, Order list changed, Diagnosis association updated, Charge Capture section accepted, Clinical Note Signed

## 2019-11-29 NOTE — Progress Notes (Signed)
ADVANCED HF CLINIC NOTE  PCP: Primary Cardiologist: Dr Harrington Challenger HF MD; Dr Haroldine Laws  HPI: Angel Lampron Sprinkleis a 63 y.o.femalewith a hx of morbid obesity, chronic systolic/diastolic CHF, PSVT, prior CVA, HTN, DM, OSA, CKD with baseline creatinine 1.3-1.4, COPD with ongoing tobacco, pulmonary hypertensionand CSF rhinorrhea s/p skull base reconstruction.   Admitted with A/C diastolic heart failure. > 30 pound weight gain in a few weeks after her  PCP discontinued lasix  06/27/2019 secondary to worsening kidney function. In ER markedly volume overloaded. [name] was placed to guide therapy with initial swan numbers showing cardiac index 1.6 and elevated filling pressures. ECHO completed and showed biventricular HF with LVEF 25-30 and RV severely reduced. Placed on milrinone and diuresed with IV lasix. As she improved milrinone was weaned off. HF medications adjusted. Discharge weight was 236 pounds.   Today she returns for routine f/u. Has been followed in [facility]. Meds titrated. Has not smoked since 08/16/2019. Says she feels great. Breathing much better. No CP. Walks up and down driveway. No edema.    Cardiac Testing Echo 08/18/19 EF 25-30% Mod LVH. RV dilated and severely HK. Personally reviewed  RHC/LHC 07/31/2019  Cardiac catheterization demonstrated minor nonobstructive CAD, mildly elevated LVEDP and moderate pulmonary hypertension. LAD 35% OM2 25% RA 10 PA 57/22 (39) PCWP 19 LVEDP 21 Fick 3.7/1.7   ROS: All systems negative except as listed in HPI, PMH and Problem List.  SH:  Social History   Socioeconomic History  . Marital status: Single    Spouse name: Not on file  . Number of children: 0  . Years of education: Not on file  . Highest education level: Not on file  Occupational History  . Occupation: disabled  Tobacco Use  . Smoking status: Former Smoker    Packs/day: 0.50    Years: 35.00    Pack years: 17.50    Types: Cigarettes    Quit date: 08/16/2019   Years since quitting: 0.2  . Smokeless tobacco: Never Used  Substance and Sexual Activity  . Alcohol use: Yes    Alcohol/week: 1.0 standard drinks    Types: 1 Glasses of wine per week    Comment: rarely  . Drug use: No  . Sexual activity: Never    Birth control/protection: Surgical  Other Topics Concern  . Not on file  Social History Narrative   Currently unemployed.   No children, not married   Social Determinants of Radio broadcast assistant Strain:   . Difficulty of Paying Living Expenses: Not on file  Food Insecurity:   . Worried About Charity fundraiser in the Last Year: Not on file  . Ran Out of Food in the Last Year: Not on file  Transportation Needs:   . Lack of Transportation (Medical): Not on file  . Lack of Transportation (Non-Medical): Not on file  Physical Activity:   . Days of Exercise per Week: Not on file  . Minutes of Exercise per Session: Not on file  Stress:   . Feeling of Stress : Not on file  Social Connections:   . Frequency of Communication with Friends and Family: Not on file  . Frequency of Social Gatherings with Friends and Family: Not on file  . Attends Religious Services: Not on file  . Active Member of Clubs or Organizations: Not on file  . Attends Archivist Meetings: Not on file  . Marital Status: Not on file  Intimate Partner Violence:   . Fear of  Current or Ex-Partner: Not on file  . Emotionally Abused: Not on file  . Physically Abused: Not on file  . Sexually Abused: Not on file    FH:  Family History  Problem Relation Age of Onset  . Colon cancer Sister 56  . Breast cancer Mother 90  . Alzheimer's disease Mother   . Diabetes Sister 47  . Heart failure Sister   . Diabetes Sister 32  . Lung cancer Father   . Breast cancer Sister   . Cancer Sister        mouth  . Anesthesia problems Neg Hx   . Hypotension Neg Hx   . Malignant hyperthermia Neg Hx   . Pseudochol deficiency Neg Hx     Past Medical History:    Diagnosis Date  . Anxiety    doesn't take any meds for this  . Arteriosclerosis of coronary artery 08/31/2011   Myoview 9/12: EF 44, no ischemia // Myoview 8/18: EF 57, low risk, possible small area of inferior ischemia // Minor nonobstructive CAD by cardiac catheterization - LHC 8/18: Proximal LAD 35, OM2 25  . Arthritis    knuckles  . Asthma   . Carotid artery disease ([place])    Carotid US 7/14: Bilateral ICA 40-59 // Carotid US ([facility]) 2/17: bilat plaque without sig stenosis  . Chronic diastolic CHF (congestive heart failure) ([place]) 07/26/2017   Echo 7/18:Severe conc LVH, EF 60-65, no RWMA, Gr 2 DD, MAC, mild MR // right and left heart cath 8/18: LVEDP 21  . Claustrophobia 07/26/2012  . COPD (chronic obstructive pulmonary disease) ([place])    "chronic bronchitis - about every winter"  . CSF leak    dizziness/headache - assoc symptoms // 2/2 encephalocele s/p skull base reconstruction  . Diverticulosis   . History of CVA (cerebrovascular accident) 03/2004   denies residual 07/26/2012  . Hx of gout   . Hyperlipidemia    takes Pravastatin daily  . Hypertensive heart disease with chronic diastolic congestive heart failure ([place]) 07/26/2017  . Internal hemorrhoids   . Kidney stones   . LVH (left ventricular hypertrophy)    Echo 8/18: Severe conc LVH, EF 60-65, no RWMA, Gr 2 DD, MAC, mild MR  . OSA (obstructive sleep apnea) 07/26/2012   prior CPAP - stopped due to being primary caregiver for dying parent (stopped in 2016)  . PSVT (paroxysmal supraventricular tachycardia) ([place])   . Pulmonary hypertension, unspecified ([place]) 08/14/2017   cath 07/2017 >>PASP 39m Hg, PA mean 39 mm Hg  . Type II diabetes mellitus ([place]     Current Outpatient Medications  Medication Sig Dispense Refill  . acetaminophen (TYLENOL) 325 MG tablet Take 2 tablets (650 mg total) by mouth every 6 (six) hours as needed for mild pain. 90 tablet 2  . allopurinol (ZYLOPRIM) 100 MG tablet Take 100 mg by mouth daily.     .Marland Kitchen aspirin EC 81 MG tablet Take 1 tablet (81 mg total) by mouth daily. 90 tablet 3  . Blood Glucose Monitoring Suppl (ONETOUCH VERIO) w/Device KIT USE TO CHECK BLOOD GLUCOSE 4 TIMES A DAY . DIAGNOSIS: TYPE 2 DIABETES E11.59 1 kit 0  . busPIRone (BUSPAR) 15 MG tablet Take 15 mg by mouth 2 (two) times daily.     . CHANTIX STARTING MONTH PAK 0.5 MG X 11 & 1 MG X 42 tablet TAKE AS DIRECTED ON PACKAGE 53 each 0  . empagliflozin (JARDIANCE) 10 MG TABS tablet Take 10 mg by mouth daily before breakfast.  30 tablet 11  . FLUoxetine (PROZAC) 20 MG capsule TAKE 1 CAPSULE (20 MG TOTAL) BY MOUTH AT BEDTIME. 90 capsule 2  . gabapentin (NEURONTIN) 400 MG capsule Take 1 capsule (400 mg total) by mouth at bedtime.    Marland Kitchen glucose blood (ONETOUCH VERIO) test strip For E11.22.  Use to check glucose qid 100 strip 12  . hydrALAZINE (APRESOLINE) 50 MG tablet Take 1 tablet (50 mg total) by mouth every 8 (eight) hours. 270 tablet 3  . insulin aspart (NOVOLOG FLEXPEN) 100 UNIT/ML FlexPen Inject 5 Units into the skin 3 (three) times daily with meals.    . Insulin Glargine 300 UNIT/ML SOPN Inject 60 Units into the skin daily.    . isosorbide mononitrate (IMDUR) 60 MG 24 hr tablet Take 1 tablet (60 mg total) by mouth daily. ** DO NOT CRUSH ** 30 tablet 11  . losartan (COZAAR) 25 MG tablet Take 0.5 tablets (12.5 mg total) by mouth daily. 30 tablet 0  . nitroGLYCERIN (NITROSTAT) 0.4 MG SL tablet Place 1 tablet (0.4 mg total) under the tongue every 5 (five) minutes as needed. 25 tablet 3  . omeprazole (PRILOSEC) 40 MG capsule TAKE 1 CAPSULE BY MOUTH 30- 60 MIN BEFORE YOUR FIRST AND LAST MEALS OF THE DAY 180 capsule 0  . potassium chloride SA (KLOR-CON M20) 20 MEQ tablet Take 1 tablet daily only when directed by the heart failure clinic 30 tablet 0  . rosuvastatin (CRESTOR) 20 MG tablet Take 1 tablet (20 mg total) by mouth daily. 90 tablet 2  . topiramate (TOPAMAX) 25 MG tablet Take 1 tablet (25 mg total) by mouth 2 (two) times daily.  180 tablet 1  . torsemide (DEMADEX) 20 MG tablet Take 1 tablet (20 mg total) by mouth every Monday, Wednesday, and Friday. 15 tablet 0  . VENTOLIN HFA 108 (90 Base) MCG/ACT inhaler TAKE 2 PUFFS BY MOUTH EVERY 6 HOURS AS NEEDED FOR WHEEZE OR SHORTNESS OF BREATH 18 g 1   No current facility-administered medications for this encounter.    Vitals:   11/29/19 1036  BP: (!) 143/59  Pulse: 72  SpO2: 95%  Weight: 102.2 kg (225 lb 3.2 oz)   Wt Readings from Last 3 Encounters:  11/29/19 102.2 kg (225 lb 3.2 oz)  11/15/19 102.2 kg (225 lb 6.4 oz)  10/14/19 102.4 kg (225 lb 12.8 oz)    PHYSICAL EXAM: General:  Well appearing. No resp difficulty Hoarse voice HEENT: normal Neck: supple. no JVD. Carotids 2+ bilat; no bruits. No lymphadenopathy or thryomegaly appreciated. Cor: PMI nondisplaced. Regular rate & rhythm. No rubs, gallops or murmurs. Lungs: clear with decreased BS Abdomen: obese soft, nontender, nondistended. No hepatosplenomegaly. No bruits or masses. Good bowel sounds. Extremities: no cyanosis, clubbing, rash, edema Neuro: alert & orientedx3, cranial nerves grossly intact. moves all 4 extremities w/o difficulty. Affect pleasant   ASSESSMENT & PLAN:  1. Chronic biventricular HF due to NICM - Echo 9/20 EF 25-30% (new). RV severely HK  - Cath 8/20 with minimal CAD - Much improved. Volume status stable. Continue torsemide 20 mg daily.  - Continue hydralazine 50 mg three times a day + 30 mg imdur.  - Will switch losartan to Entresto 24/26 bid. Watch renal function closely. May be able to cut back on torsemide with starting Entresto - Continue Jardiance - Not on b-blocker yet with COPD hopefully soon  - Labs today and in 1 week - Repeat echo   2. Chronic hypoxic respiratory failure - improved  3. PAH, mild to moderat - likely who GROUP II & III - Has sleep apnea but has not used CPAP.  - CPAP pending  4. CKD Stage III - baseline creatinine 1.4-1.7 - Check BMET today.    5. COPD - Quit smoking since 08/16/19. Awesome!   6. Morbid obesity Body mass index is 39.89 kg/m. Discussed portion control.   7. OSA - pending CPAP with Dr. Radford Pax  8. DM2 - on SGLT2i   Glori Bickers MD  10:42 AM

## 2019-11-29 NOTE — Patient Instructions (Signed)
Stop Losartan   Start Entresto 24/26 mg Twice daily   Labs done today, we will notify you for abnormal results  Labs in 2 weeks  Your physician recommends that you schedule a follow-up appointment in: 1 month with echocardiogram  If you have any questions or concerns before your next appointment please send [place] a message through [place] or call our office at [phone].  At the [facility], you and your health needs are our priority. As part of our continuing mission to provide you with exceptional heart care, we have created designated Provider Care Teams. These Care Teams include your primary Cardiologist (physician) and Advanced Practice Providers (APPs- Physician Assistants and Nurse Practitioners) who all work together to provide you with the care you need, when you need it.   You may see any of the following providers on your designated Care Team at your next follow up: Marland Kitchen Dr Glori Bickers . Dr Loralie Champagne . Darrick Grinder, NP . Lyda Jester, PA . Audry Riles, PharmD   Please be sure to bring in all your medications bottles to every appointment.

## 2019-11-30 ENCOUNTER — Encounter (HOSPITAL_COMMUNITY): Payer: Self-pay

## 2019-11-30 ENCOUNTER — Other Ambulatory Visit: Payer: Self-pay

## 2019-11-30 ENCOUNTER — Ambulatory Visit (HOSPITAL_COMMUNITY)
Admission: EM | Admit: 2019-11-30 | Discharge: 2019-11-30 | Disposition: A | Discharge status: Home / Self Care | Payer: Medicare Other | Attending: Emergency Medicine | Admitting: Emergency Medicine

## 2019-11-30 DIAGNOSIS — R05 Cough: Secondary | ICD-10-CM

## 2019-11-30 DIAGNOSIS — R432 Parageusia: Secondary | ICD-10-CM | POA: Diagnosis not present

## 2019-11-30 DIAGNOSIS — R5383 Other fatigue: Secondary | ICD-10-CM

## 2019-11-30 DIAGNOSIS — R059 Cough, unspecified: Secondary | ICD-10-CM

## 2019-11-30 DIAGNOSIS — Z20828 Contact with and (suspected) exposure to other viral communicable diseases: Secondary | ICD-10-CM | POA: Diagnosis not present

## 2019-11-30 DIAGNOSIS — Z20822 Contact with and (suspected) exposure to covid-19: Secondary | ICD-10-CM

## 2019-11-30 LAB — POCT RAPID STREP A: Streptococcus, Group A Screen (Direct): NEGATIVE

## 2019-11-30 MED ORDER — BENZONATATE 100 MG PO CAPS: 100.0000 mg | ORAL_CAPSULE | Freq: Three times a day (TID) | ORAL | 0 refills | Status: DC

## 2019-11-30 NOTE — ED Triage Notes (Signed)
Patient presents to Urgent Care with complaints of cough, loss of taste, and general unwellness since 2 days ago. Patient reports she has not been taking medicine for her sx at home.

## 2019-11-30 NOTE — Discharge Instructions (Addendum)
Strep test was negative.  Will send for culture.    COVID testing ordered.  It will take between 2-7 days for test results.  Someone will contact you regarding abnormal results.    In the meantime: You should remain isolated in your home for 10 days from symptom onset  Get plenty of rest and push fluids Tessalon Perles prescribed for cough Use medications daily for symptom relief Use OTC medications like ibuprofen or tylenol as needed fever or pain Call or go to the ED if you have any new or worsening symptoms such as fever, worsening cough, shortness of breath, chest tightness, chest pain, turning blue, changes in mental status, etc..Marland Kitchen

## 2019-11-30 NOTE — ED Provider Notes (Signed)
[place]    CSN: [id number] Arrival date & time: 11/30/19  1029      History   Chief Complaint Chief Complaint  Patient presents with  . Cough    HPI [name] is a 63 y.o. female.   [name] 63 years old female presented to the urgent care with a complaint of cough, loss of taste, fatigue for the past 2 days.  Has not tried any OTC medication.  Denies sick exposure to COVID, flu or strep.  Denies recent travel.  Denies aggravating or alleviating symptoms.  Denies previous COVID infection.   Denies fever, chills,  nasal congestion, rhinorrhea, sore throat, SOB, wheezing, chest pain, nausea, vomiting, changes in bowel or bladder habits.       Past Medical History:  Diagnosis Date  . Anxiety    doesn't take any meds for this  . Arteriosclerosis of coronary artery 08/31/2011   Myoview 9/12: EF 44, no ischemia // Myoview 8/18: EF 57, low risk, possible small area of inferior ischemia // Minor nonobstructive CAD by cardiac catheterization - LHC 8/18: Proximal LAD 35, OM2 25  . Arthritis    knuckles  . Asthma   . Carotid artery disease ([place])    Carotid US 7/14: Bilateral ICA 40-59 // Carotid US ([facility]) 2/17: bilat plaque without sig stenosis  . Chronic diastolic CHF (congestive heart failure) ([place]) 07/26/2017   Echo 7/18:Severe conc LVH, EF 60-65, no RWMA, Gr 2 DD, MAC, mild MR // right and left heart cath 8/18: LVEDP 21  . Claustrophobia 07/26/2012  . COPD (chronic obstructive pulmonary disease) ([place])    "chronic bronchitis - about every winter"  . CSF leak    dizziness/headache - assoc symptoms // 2/2 encephalocele s/p skull base reconstruction  . Diverticulosis   . History of CVA (cerebrovascular accident) 03/2004   denies residual 07/26/2012  . Hx of gout   . Hyperlipidemia    takes Pravastatin daily  . Hypertensive heart disease with chronic diastolic congestive heart failure ([place]) 07/26/2017  . Internal hemorrhoids   . Kidney stones   .  LVH (left ventricular hypertrophy)    Echo 8/18: Severe conc LVH, EF 60-65, no RWMA, Gr 2 DD, MAC, mild MR  . OSA (obstructive sleep apnea) 07/26/2012   prior CPAP - stopped due to being primary caregiver for dying parent (stopped in 2016)  . PSVT (paroxysmal supraventricular tachycardia) ([place])   . Pulmonary hypertension, unspecified ([place]) 08/14/2017   cath 07/2017 >>PASP 80m Hg, PA mean 39 mm Hg  . Type II diabetes mellitus ([facility]     Patient Active Problem List   Diagnosis Date Noted  . Biventricular heart failure ([place] 09/07/2019  . Acute gout 09/07/2019  . Migraine syndrome 07/05/2019  . Edema 05/23/2019  . COPD exacerbation ([place] 01/05/2019  . Type 2 diabetes mellitus with stage 3 chronic kidney disease, with long-term current use of insulin ([place] 11/18/2018  . Screening for breast cancer 06/30/2018  . Nicotine dependence, cigarettes, uncomplicated 045/36/4680 . Pulmonary hypertension, unspecified ([place] 08/14/2017  . Hypertensive heart disease with chronic diastolic congestive heart failure ([place] 07/26/2017  . Paroxysmal SVT (supraventricular tachycardia) ([place] 07/26/2017  . COPD  GOLD 0 still smoking 07/13/2017  . Moderate episode of recurrent major depressive disorder ([place] 07/12/2015  . OSA (obstructive sleep apnea) 10/17/2014  . Carotid artery disease ([place] 06/03/2013  . Uric acid kidney stone 07/04/2012  . CSF leak from nose 10/19/2011  . Arteriosclerosis of coronary artery 08/31/2011  .  Type 2 diabetes mellitus with diabetic polyneuropathy, with long-term current use of insulin ([place]) 08/31/2011  . HLD (hyperlipidemia) 07/02/2011  . Essential hypertension 06/20/2011  . Morbid obesity due to excess calories ([place]) 06/20/2011    Past Surgical History:  Procedure Laterality Date  . ESOPHAGOGASTRODUODENOSCOPY (EGD) WITH PROPOFOL N/A 07/13/2015   Procedure: ESOPHAGOGASTRODUODENOSCOPY (EGD) WITH PROPOFOL;  Surgeon: Carol Ada, MD;  Location: [facility];  Service: Endoscopy;   Laterality: N/A;  . NASAL SINUS SURGERY  01/29/2012   Procedure: ENDOSCOPIC SINUS SURGERY WITH STEALTH;  Surgeon: Ruby Cola, MD;  Location: [facility];  Service: ENT;  Laterality: N/A;  . NM MYOVIEW LTD  08/25/2011   Low risk study, no evidence of ischemia, EF 44%  . REFRACTIVE SURGERY  ~ 2010   left  . RIGHT/LEFT HEART CATH AND CORONARY ANGIOGRAPHY N/A 07/30/2017   Procedure: RIGHT/LEFT HEART CATH AND CORONARY ANGIOGRAPHY;  Surgeon: Martinique, Peter M, MD;  Location: [facility];  Service: Cardiovascular;  Laterality: N/A;  . SPHENOIDECTOMY  01/29/2012   Procedure: SPHENOIDECTOMY;  Surgeon: Ruby Cola, MD;  Location: [facility];  Service: ENT;  Laterality: Left;  REPAIR OF LEFT SPHENOID    . TOTAL ABDOMINAL HYSTERECTOMY  1994    OB History    Gravida  0   Para      Term      Preterm      AB      Living        SAB      TAB      Ectopic      Multiple      Live Births               Home Medications    Prior to Admission medications   Medication Sig Start Date End Date Taking? Authorizing Provider  acetaminophen (TYLENOL) 325 MG tablet Take 2 tablets (650 mg total) by mouth every 6 (six) hours as needed for mild pain. 06/30/18   Luetta Nutting, DO  allopurinol (ZYLOPRIM) 100 MG tablet Take 100 mg by mouth daily.  06/24/18   [provider]  aspirin EC 81 MG tablet Take 1 tablet (81 mg total) by mouth daily. 07/13/17   Richardson Dopp T, PA-C  benzonatate (TESSALON) 100 MG capsule Take 1 capsule (100 mg total) by mouth every 8 (eight) hours. 11/30/19   Glendia Olshefski, Darrelyn Hillock, FNP  Blood Glucose Monitoring Suppl (ONETOUCH VERIO) w/Device KIT USE TO CHECK BLOOD GLUCOSE 4 TIMES A DAY . DIAGNOSIS: TYPE 2 DIABETES E11.59 11/09/19   Luetta Nutting, DO  busPIRone (BUSPAR) 15 MG tablet Take 15 mg by mouth 2 (two) times daily.  06/17/16   [provider]  CHANTIX STARTING MONTH PAK 0.5 MG X 11 & 1 MG X 42 tablet TAKE AS DIRECTED ON PACKAGE 09/27/19   Luetta Nutting, DO    empagliflozin (JARDIANCE) 10 MG TABS tablet Take 10 mg by mouth daily before breakfast. 10/13/19   Bensimhon, Shaune Pascal, MD  FLUoxetine (PROZAC) 20 MG capsule TAKE 1 CAPSULE (20 MG TOTAL) BY MOUTH AT BEDTIME. 07/31/19   Luetta Nutting, DO  gabapentin (NEURONTIN) 400 MG capsule Take 1 capsule (400 mg total) by mouth at bedtime. 08/26/19   Domenic Polite, MD  glucose blood [facility] VERIO) test strip For E11.22.  Use to check glucose qid 11/09/19   Luetta Nutting, DO  hydrALAZINE (APRESOLINE) 50 MG tablet Take 1 tablet (50 mg total) by mouth every 8 (eight) hours. 09/22/19   Consuelo Pandy,  PA-C  insulin aspart (NOVOLOG FLEXPEN) 100 UNIT/ML FlexPen Inject 5 Units into the skin 3 (three) times daily with meals. 08/26/19   Domenic Polite, MD  Insulin Glargine 300 UNIT/ML SOPN Inject 60 Units into the skin daily. 08/26/19   Domenic Polite, MD  isosorbide mononitrate (IMDUR) 60 MG 24 hr tablet Take 1 tablet (60 mg total) by mouth daily. ** DO NOT CRUSH ** 10/13/19   Bensimhon, Shaune Pascal, MD  losartan (COZAAR) 25 MG tablet Take 0.5 tablets (12.5 mg total) by mouth daily. 08/27/19   Domenic Polite, MD  nitroGLYCERIN (NITROSTAT) 0.4 MG SL tablet Place 1 tablet (0.4 mg total) under the tongue every 5 (five) minutes as needed. 07/27/17   Richardson Dopp T, PA-C  omeprazole (PRILOSEC) 40 MG capsule TAKE 1 CAPSULE BY MOUTH 30- 60 MIN BEFORE YOUR FIRST AND LAST MEALS OF THE DAY 09/26/19   Clegg, Amy D, NP  potassium chloride SA (KLOR-CON M20) 20 MEQ tablet Take 1 tablet daily only when directed by the heart failure clinic 11/15/19   Bensimhon, Shaune Pascal, MD  rosuvastatin (CRESTOR) 20 MG tablet Take 1 tablet (20 mg total) by mouth daily. 09/01/19 11/30/19  Clegg, Amy D, NP  sacubitril-valsartan (ENTRESTO) 24-26 MG Take 1 tablet by mouth 2 (two) times daily. 11/29/19   Bensimhon, Shaune Pascal, MD  topiramate (TOPAMAX) 25 MG tablet Take 1 tablet (25 mg total) by mouth 2 (two) times daily. 07/04/19   Luetta Nutting, DO   torsemide (DEMADEX) 20 MG tablet Take 1 tablet (20 mg total) by mouth every Monday, Wednesday, and Friday. 11/16/19   Bensimhon, Shaune Pascal, MD  VENTOLIN HFA 108 (90 Base) MCG/ACT inhaler TAKE 2 PUFFS BY MOUTH EVERY 6 HOURS AS NEEDED FOR WHEEZE OR SHORTNESS OF BREATH 09/27/19   Luetta Nutting, DO    Family History Family History  Problem Relation Age of Onset  . Colon cancer Sister 72  . Breast cancer Mother 109  . Alzheimer's disease Mother   . Diabetes Sister 8  . Heart failure Sister   . Diabetes Sister 55  . Lung cancer Father   . Breast cancer Sister   . Cancer Sister        mouth  . Anesthesia problems Neg Hx   . Hypotension Neg Hx   . Malignant hyperthermia Neg Hx   . Pseudochol deficiency Neg Hx     Social History Social History   Tobacco Use  . Smoking status: Former Smoker    Packs/day: 0.50    Years: 35.00    Pack years: 17.50    Types: Cigarettes    Quit date: 08/16/2019    Years since quitting: 0.2  . Smokeless tobacco: Never Used  Substance Use Topics  . Alcohol use: Yes    Alcohol/week: 1.0 standard drinks    Types: 1 Glasses of wine per week    Comment: rarely  . Drug use: No     Allergies   Atorvastatin, Ibuprofen, Metformin and related, Ace inhibitors, Hydrocodone-acetaminophen, and Albuterol   Review of Systems Review of Systems  Constitutional: Positive for fatigue.  HENT: Negative.   Respiratory: Positive for cough.   Cardiovascular: Negative.   Gastrointestinal: Negative.   Neurological: Negative.        Loss of taste  ROS: All other are negatives   Physical Exam Triage Vital Signs ED Triage Vitals  Enc Vitals Group     BP 11/30/19 1103 (!) 158/88     Pulse Rate 11/30/19 1103 77  Resp 11/30/19 1103 16     Temp 11/30/19 1103 98.5 F (36.9 C)     Temp Source 11/30/19 1103 Oral     SpO2 11/30/19 1103 95 %     Weight --      Height --      Head Circumference --      Peak Flow --      Pain Score 11/30/19 1113 0     Pain Loc  --      Pain Edu? --      Excl. in [place]? --    No data found.  Updated Vital Signs BP (!) 158/88 (BP Location: Left Wrist)   Pulse 77   Temp 98.5 F (36.9 C) (Oral)   Resp 16   SpO2 95%   Visual Acuity Right Eye Distance:   Left Eye Distance:   Bilateral Distance:    Right Eye Near:   Left Eye Near:    Bilateral Near:     Physical Exam Constitutional:      General: She is not in acute distress.    Appearance: Normal appearance. She is normal weight. She is not ill-appearing or toxic-appearing.  HENT:     Head: Normocephalic.     Right Ear: Tympanic membrane, ear canal and external ear normal. There is no impacted cerumen.     Left Ear: Ear canal and external ear normal. There is no impacted cerumen.     Nose: Nose normal. No congestion.     Mouth/Throat:     Mouth: Mucous membranes are moist.     Pharynx: No oropharyngeal exudate or posterior oropharyngeal erythema.  Cardiovascular:     Rate and Rhythm: Normal rate and regular rhythm.     Pulses: Normal pulses.     Heart sounds: Normal heart sounds. No murmur.  Pulmonary:     Effort: Pulmonary effort is normal. No respiratory distress.     Breath sounds: No wheezing or rhonchi.  Chest:     Chest wall: No tenderness.  Abdominal:     General: Abdomen is flat. Bowel sounds are normal. There is no distension.     Palpations: There is no mass.  Skin:    Capillary Refill: Capillary refill takes less than 2 seconds.  Neurological:     Mental Status: She is alert and oriented to person, place, and time.      UC Treatments / Results  Labs (all labs ordered are listed, but only abnormal results are displayed) Labs Reviewed  NOVEL CORONAVIRUS, NAA (HOSP ORDER, SEND-OUT TO REF LAB; TAT 18-24 HRS)  CULTURE, GROUP A STREP [facility])  POCT RAPID STREP A    EKG   Radiology No results found.  Procedures Procedures (including critical care time)  Medications Ordered in UC Medications - No data to display  Initial  Impression / Assessment and Plan / UC Course  I have reviewed the triage vital signs and the nursing notes.  Pertinent labs & imaging results that were available during my care of the patient were reviewed by me and considered in my medical decision making (see chart for details).    Patient stable for discharge.  Benign physical exam.  Strep test was negative.  Will send it for culture.  Advised patient to take [facility] as prescribed.  Will call patient if COVID-19 test is abnormal.  To return for worsening of symptoms.  Patient verbalized understanding of the plan of care. Final Clinical Impressions(s) / UC Diagnoses   Final diagnoses:  Suspected COVID-19 virus infection  Cough     Discharge Instructions     Strep test was negative.  Will send for culture.    COVID testing ordered.  It will take between 2-7 days for test results.  Someone will contact you regarding abnormal results.    In the meantime: You should remain isolated in your home for 10 days from symptom onset  Get plenty of rest and push fluids Tessalon Perles prescribed for cough Use medications daily for symptom relief Use OTC medications like ibuprofen or tylenol as needed fever or pain Call or go to the ED if you have any new or worsening symptoms such as fever, worsening cough, shortness of breath, chest tightness, chest pain, turning blue, changes in mental status, etc...     ED Prescriptions    Medication Sig Dispense Auth. Provider   benzonatate (TESSALON) 100 MG capsule Take 1 capsule (100 mg total) by mouth every 8 (eight) hours. 21 capsule Deren Degrazia, Darrelyn Hillock, FNP     PDMP not reviewed this encounter.   Emerson Monte, [place] 11/30/19 1145

## 2019-12-02 LAB — CULTURE, GROUP A STREP (THRC)

## 2019-12-02 LAB — NOVEL CORONAVIRUS, NAA (HOSP ORDER, SEND-OUT TO REF LAB; TAT 18-24 HRS): SARS-CoV-2, NAA: NOT DETECTED

## 2019-12-07 ENCOUNTER — Other Ambulatory Visit (HOSPITAL_COMMUNITY): Payer: Self-pay | Admitting: Internal Medicine

## 2019-12-07 ENCOUNTER — Ambulatory Visit: Payer: Medicare Other

## 2019-12-07 DIAGNOSIS — I5032 Chronic diastolic (congestive) heart failure: Secondary | ICD-10-CM

## 2019-12-14 ENCOUNTER — Ambulatory Visit (INDEPENDENT_AMBULATORY_CARE_PROVIDER_SITE_OTHER): Payer: Medicare Other | Admitting: Family Medicine

## 2019-12-14 ENCOUNTER — Telehealth: Payer: Self-pay | Admitting: Family Medicine

## 2019-12-14 ENCOUNTER — Other Ambulatory Visit: Payer: Self-pay

## 2019-12-14 VITALS — BP 140/80 | HR 82 | Temp 96.4°F | Ht 63.0 in | Wt 229.0 lb

## 2019-12-14 DIAGNOSIS — F17211 Nicotine dependence, cigarettes, in remission: Secondary | ICD-10-CM | POA: Diagnosis not present

## 2019-12-14 DIAGNOSIS — Z794 Long term (current) use of insulin: Secondary | ICD-10-CM | POA: Diagnosis not present

## 2019-12-14 DIAGNOSIS — I5082 Biventricular heart failure: Secondary | ICD-10-CM

## 2019-12-14 DIAGNOSIS — N183 Chronic kidney disease, stage 3 unspecified: Secondary | ICD-10-CM | POA: Diagnosis not present

## 2019-12-14 DIAGNOSIS — I10 Essential (primary) hypertension: Secondary | ICD-10-CM | POA: Diagnosis not present

## 2019-12-14 DIAGNOSIS — E1122 Type 2 diabetes mellitus with diabetic chronic kidney disease: Secondary | ICD-10-CM | POA: Diagnosis not present

## 2019-12-14 DIAGNOSIS — E1142 Type 2 diabetes mellitus with diabetic polyneuropathy: Secondary | ICD-10-CM

## 2019-12-14 LAB — HEMOGLOBIN A1C: Hgb A1c MFr Bld: 6 % (ref 4.6–6.5)

## 2019-12-14 LAB — COMPREHENSIVE METABOLIC PANEL
ALT: 7 U/L (ref 0–35)
AST: 13 U/L (ref 0–37)
Albumin: 3.9 g/dL (ref 3.5–5.2)
Alkaline Phosphatase: 92 U/L (ref 39–117)
BUN: 20 mg/dL (ref 6–23)
CO2: 28 mEq/L (ref 19–32)
Calcium: 8.8 mg/dL (ref 8.4–10.5)
Chloride: 106 mEq/L (ref 96–112)
Creatinine, Ser: 1.37 mg/dL — ABNORMAL HIGH (ref 0.40–1.20)
GFR: 38.99 mL/min — ABNORMAL LOW (ref >60.00)
Glucose, Bld: 122 mg/dL — ABNORMAL HIGH (ref 70–99)
Potassium: 4.4 mEq/L (ref 3.5–5.1)
Sodium: 141 mEq/L (ref 135–145)
Total Bilirubin: 0.7 mg/dL (ref 0.2–1.2)
Total Protein: 6.5 g/dL (ref 6.0–8.3)

## 2019-12-14 LAB — BRAIN NATRIURETIC PEPTIDE: Pro B Natriuretic peptide (BNP): 905 pg/mL — ABNORMAL HIGH (ref 0.0–100.0)

## 2019-12-14 NOTE — Telephone Encounter (Signed)
Patient is requesting to [facility] from Dr. Zigmund Daniel to Dr. Bryan Lemma. Please advise.

## 2019-12-14 NOTE — Patient Instructions (Addendum)
Take a dose of torsemide tomorrow. Get back on track with weight loss.  Congratulations on staying quit from smoking!    Let me know if cough is not improving.    I will miss you!

## 2019-12-14 NOTE — Telephone Encounter (Signed)
Ok with me 

## 2019-12-15 ENCOUNTER — Ambulatory Visit: Payer: Medicare Other | Admitting: Podiatry

## 2019-12-16 ENCOUNTER — Telehealth: Payer: Self-pay | Admitting: Family Medicine

## 2019-12-16 ENCOUNTER — Ambulatory Visit: Payer: Medicare Other

## 2019-12-16 NOTE — Telephone Encounter (Signed)
Patient still has a cough with a little sob. Pt states she just don't feel right. I advise pt to go to the ER or urgent care if symptoms get worse and follow up with [place] next week. Pt verbalized understanding.

## 2019-12-16 NOTE — Telephone Encounter (Signed)
Patient niece is calling and wanted to see if patient could get a chest xray due to chronic cough and sob. Pt was seen on 12/14/2019 by Dr. Zigmund Daniel.  CB for niece is [phone]

## 2019-12-18 ENCOUNTER — Other Ambulatory Visit: Payer: Self-pay | Admitting: Family Medicine

## 2019-12-19 NOTE — Telephone Encounter (Signed)
Glad to hear she is feeling better.  I think she had a little extra fluid. If she feels that way again she can take an extra torsemide on one of her "off" days (Tuesday/Thursday).  Thanks!

## 2019-12-19 NOTE — Telephone Encounter (Signed)
How is she feeling today?

## 2019-12-19 NOTE — Telephone Encounter (Signed)
Patient verbalized understanding and said thank you so much for checking on her.

## 2019-12-19 NOTE — Telephone Encounter (Signed)
Patient is feeling better she did not have to go to urgent care.

## 2019-12-20 ENCOUNTER — Encounter: Payer: Self-pay | Admitting: Family Medicine

## 2019-12-20 NOTE — Assessment & Plan Note (Signed)
Blood sugars have been better controlled at home.  Update a1c  Continue current medications.

## 2019-12-20 NOTE — Progress Notes (Signed)
[name] - 64 y.o. female MRN [id number]  Date of birth: [birth year]  Subjective Chief Complaint  Patient presents with  . Follow-up    Follow up HTN DM.    HPI [name] is a 64 y.o. female with history of multiple medical issues including T2DM, HTN, CVA, spontaneous CSF leak s/p skull base surgery, COPD, CKD and recent dx of biventricular heart failure.  She is here today for a follow up visit. She reports mild cough and chest congestion today which has been going on for a few weeks.  She denies shortness of breath. Had negative COVID test when symptoms started.    -HTN/CHF:  History of HTN with hospitalization for biventricular CHF in 08/2019.  She is followed by cardiology/chf clinic.  She was recently started on entresto and continues on hydralazine and imdur.  She is also talking torsemide 58m MWF..Marland Kitchen Her BP is well controlled with current medications.  She denies weight change on home scales or increased edema.   -T2DM:  Current management with jardiance with lantus 60 units daily and novolog 5 units tid.  She reports that blood sugar readings have been much improved at home as she has really been watching her sugar intake.  She does not get a whole lot of exercise or activity.    -COPD/Nicotine dependence:  History of mild COPD and tobacco dependence.  She has been able to stay quit from smoking since d/c from hospital this past September.  She reports she is feeling much better since quitting smoking.   ROS:  A comprehensive ROS was completed and negative except as noted per HPI    Allergies  Allergen Reactions  . Atorvastatin Other (See Comments) and Nausea And Vomiting    "legs cramped; moderate to almost severe" "legs cramped; moderate to almost severe"  . Ibuprofen Other (See Comments)    Other reaction(s): Other (See Comments) Per pt, MD doesn't want pt to take Per pt, MD doesn't want pt to take  . Metformin And Related Nausea And Vomiting  . Ace  Inhibitors Cough and Nausea And Vomiting  . Hydrocodone-Acetaminophen Nausea And Vomiting  . Albuterol Other (See Comments)    Caused SVT    Past Medical History:  Diagnosis Date  . Anxiety    doesn't take any meds for this  . Arteriosclerosis of coronary artery 08/31/2011   Myoview 9/12: EF 44, no ischemia // Myoview 8/18: EF 57, low risk, possible small area of inferior ischemia // Minor nonobstructive CAD by cardiac catheterization - LHC 8/18: Proximal LAD 35, OM2 25  . Arthritis    knuckles  . Asthma   . Carotid artery disease ([place]    Carotid [place]/14: Bilateral ICA 40-59 // Carotid [place]([facility]) 2/17: bilat plaque without sig stenosis  . Chronic diastolic CHF (congestive heart failure) ([place] 07/26/2017   Echo 7/18:Severe conc LVH, EF 60-65, no RWMA, Gr 2 DD, MAC, mild MR // right and left heart cath 8/18: LVEDP 21  . Claustrophobia 07/26/2012  . COPD (chronic obstructive pulmonary disease) ([facility]    "chronic bronchitis - about every winter"  . CSF leak    dizziness/headache - assoc symptoms // 2/2 encephalocele s/p skull base reconstruction  . Diverticulosis   . History of CVA (cerebrovascular accident) 03/2004   denies residual 07/26/2012  . Hx of gout   . Hyperlipidemia    takes Pravastatin daily  . Hypertensive heart disease with chronic diastolic congestive heart failure ([facility] 07/26/2017  .  Internal hemorrhoids   . Kidney stones   . LVH (left ventricular hypertrophy)    Echo 8/18: Severe conc LVH, EF 60-65, no RWMA, Gr 2 DD, MAC, mild MR  . OSA (obstructive sleep apnea) 07/26/2012   prior CPAP - stopped due to being primary caregiver for dying parent (stopped in 2016)  . PSVT (paroxysmal supraventricular tachycardia) ([place])   . Pulmonary hypertension, unspecified ([place]) 08/14/2017   cath 07/2017 >>PASP 69m Hg, PA mean 39 mm Hg  . Type II diabetes mellitus ([place]     Past Surgical History:  Procedure Laterality Date  . ESOPHAGOGASTRODUODENOSCOPY (EGD) WITH PROPOFOL N/A  07/13/2015   Procedure: ESOPHAGOGASTRODUODENOSCOPY (EGD) WITH PROPOFOL;  Surgeon: PCarol Ada MD;  [facility];  Service: Endoscopy;  Laterality: N/A;  . NASAL SINUS SURGERY  01/29/2012   Procedure: ENDOSCOPIC SINUS SURGERY WITH STEALTH;  Surgeon: MRuby Cola MD;  [place] [facility]  Service: ENT;  Laterality: N/A;  . NM MYOVIEW LTD  08/25/2011   Low risk study, no evidence of ischemia, EF 44%  . REFRACTIVE SURGERY  ~ 2010   left  . RIGHT/LEFT HEART CATH AND CORONARY ANGIOGRAPHY N/A 07/30/2017   Procedure: RIGHT/LEFT HEART CATH AND CORONARY ANGIOGRAPHY;  Surgeon: JMartinique Peter M, MD;  [facility];  Service: Cardiovascular;  Laterality: N/A;  . SPHENOIDECTOMY  01/29/2012   Procedure: SPHENOIDECTOMY;  Surgeon: MRuby Cola MD;  [place] [facility]  Service: ENT;  Laterality: Left;  REPAIR OF LEFT SPHENOID    . TOTAL ABDOMINAL HYSTERECTOMY  1994    Social History   Socioeconomic History  . Marital status: Single    Spouse name: Not on file  . Number of children: 0  . Years of education: Not on file  . Highest education level: Not on file  Occupational History  . Occupation: disabled  Tobacco Use  . Smoking status: Former Smoker    Packs/day: 0.50    Years: 35.00    Pack years: 17.50    Types: Cigarettes    Quit date: 08/16/2019    Years since quitting: 0.3  . Smokeless tobacco: Never Used  Substance and Sexual Activity  . Alcohol use: Yes    Alcohol/week: 1.0 standard drinks    Types: 1 Glasses of wine per week    Comment: rarely  . Drug use: No  . Sexual activity: Never    Birth control/protection: Surgical  Other Topics Concern  . Not on file  Social History Narrative   Currently unemployed.   No children, not married   Social Determinants of HRadio broadcast assistantStrain:   . Difficulty of Paying Living Expenses: Not on file  Food Insecurity:   . Worried About RCharity fundraiserin the Last Year: Not on file  . Ran Out of Food in the  Last Year: Not on file  Transportation Needs:   . Lack of Transportation (Medical): Not on file  . Lack of Transportation (Non-Medical): Not on file  Physical Activity:   . Days of Exercise per Week: Not on file  . Minutes of Exercise per Session: Not on file  Stress:   . Feeling of Stress : Not on file  Social Connections:   . Frequency of Communication with Friends and Family: Not on file  . Frequency of Social Gatherings with Friends and Family: Not on file  . Attends Religious Services: Not on file  . Active Member of Clubs or Organizations: Not on file  . Attends  Club or Organization Meetings: Not on file  . Marital Status: Not on file    Family History  Problem Relation Age of Onset  . Colon cancer Sister 48  . Breast cancer Mother 82  . Alzheimer's disease Mother   . Diabetes Sister 89  . Heart failure Sister   . Diabetes Sister 88  . Lung cancer Father   . Breast cancer Sister   . Cancer Sister        mouth  . Anesthesia problems Neg Hx   . Hypotension Neg Hx   . Malignant hyperthermia Neg Hx   . Pseudochol deficiency Neg Hx     Health Maintenance  Topic Date Due  . PAP SMEAR-Modifier  04/26/1978  . OPHTHALMOLOGY EXAM  03/04/2014  . FOOT EXAM  06/20/2015  . MAMMOGRAM  06/25/2018  . COLONOSCOPY  10/09/2019  . HEMOGLOBIN A1C  06/12/2020  . TETANUS/TDAP  03/29/2025  . INFLUENZA VACCINE  Completed  . PNEUMOCOCCAL POLYSACCHARIDE VACCINE AGE 5-64 HIGH RISK  Completed  . Hepatitis C Screening  Completed  . HIV Screening  Completed    ----------------------------------------------------------------------------------------------------------------------------------------------------------------------------------------------------------------- Physical Exam BP 140/80   Pulse 82   Temp (!) 96.4 F (35.8 C) (Temporal)   Ht _0  (1.6 m)   Wt 229 lb (103.9 kg)   SpO2 94%   BMI 40.57 kg/m   Physical Exam Constitutional:      Appearance: Normal appearance.    HENT:     Head: Normocephalic and atraumatic.  Eyes:     General: No scleral icterus. Cardiovascular:     Rate and Rhythm: Normal rate.     Pulses: Normal pulses.     Heart sounds: Normal heart sounds.  Pulmonary:     Effort: Pulmonary effort is normal.     Comments: Bibasilar crackles noted  Musculoskeletal:     Right lower leg: No edema.     Left lower leg: No edema.  Skin:    General: Skin is warm and dry.  Neurological:     General: No focal deficit present.     Mental Status: She is alert.  Psychiatric:        Mood and Affect: Mood normal.        Behavior: Behavior normal.     ------------------------------------------------------------------------------------------------------------------------------------------------------------------------------------------------------------------- Assessment and Plan  Biventricular heart failure ([place]) Continues to follow with CHF clinic Has had cough for the past few weeks.  Mild bibasilar crackles and weight up about 4lbs from last visit.  Will have her take additional dose of torsemide tomorrow (typically an off day).   Check renal function and BNP  Essential hypertension BP fairly well controlled Discussed importance of low salt diet.  Continue current medications.   Nicotine dependence, cigarettes, in remission She has been able to remain quit since 08/2019.  Congratulated on staying quit.   Type 2 diabetes mellitus with stage 3 chronic kidney disease, with long-term current use of insulin (HCC) Blood sugars have been better controlled at home.  Update a1c  Continue current medications.     This visit occurred during the SARS-CoV-2 public health emergency.  Safety protocols were in place, including screening questions prior to the visit, additional usage of staff PPE, and extensive cleaning of exam room while observing appropriate contact time as indicated for disinfecting solutions.

## 2019-12-20 NOTE — Assessment & Plan Note (Signed)
She has been able to remain quit since 08/2019.  Congratulated on staying quit.

## 2019-12-20 NOTE — Assessment & Plan Note (Signed)
BP fairly well controlled Discussed importance of low salt diet.  Continue current medications.

## 2019-12-20 NOTE — Assessment & Plan Note (Signed)
Continues to follow with CHF clinic Has had cough for the past few weeks.  Mild bibasilar crackles and weight up about 4lbs from last visit.  Will have her take additional dose of torsemide tomorrow (typically an off day).   Check renal function and BNP

## 2019-12-22 NOTE — Progress Notes (Signed)
Please let her know that her a1c is down to 6.0%.  This is great and the best I have seen her blood sugars in a long time!  She should continue current medications with continued lifestyle change.  Kidney function is stable

## 2019-12-23 ENCOUNTER — Other Ambulatory Visit (HOSPITAL_COMMUNITY): Payer: Self-pay | Admitting: Internal Medicine

## 2019-12-23 DIAGNOSIS — I5032 Chronic diastolic (congestive) heart failure: Secondary | ICD-10-CM

## 2019-12-30 ENCOUNTER — Telehealth: Payer: Self-pay | Admitting: Family Medicine

## 2019-12-30 NOTE — Telephone Encounter (Signed)
Received a message from [facility] stating they notice the pt has multiple rescue inhaler fills without filling a controller med in the last 180 days. They were wondering if it is appropriate to start a daily asthma controller therapy?   Please advise

## 2020-01-02 ENCOUNTER — Other Ambulatory Visit: Payer: Self-pay | Admitting: Family Medicine

## 2020-01-02 ENCOUNTER — Other Ambulatory Visit: Payer: Self-pay

## 2020-01-02 MED ORDER — NOVOLOG FLEXPEN 100 UNIT/ML ~~LOC~~ SOPN: 5.0000 [IU] | PEN_INJECTOR | Freq: Three times a day (TID) | SUBCUTANEOUS | Status: DC

## 2020-01-03 ENCOUNTER — Telehealth (HOSPITAL_COMMUNITY): Payer: Self-pay

## 2020-01-03 NOTE — Telephone Encounter (Signed)

## 2020-01-04 ENCOUNTER — Ambulatory Visit (HOSPITAL_COMMUNITY)
Admission: RE | Admit: 2020-01-04 | Discharge: 2020-01-04 | Disposition: A | Discharge status: Home / Self Care | Payer: Medicare Other | Source: Ambulatory Visit | Attending: Internal Medicine | Admitting: Internal Medicine

## 2020-01-04 ENCOUNTER — Encounter (HOSPITAL_COMMUNITY): Payer: Self-pay | Admitting: Internal Medicine

## 2020-01-04 ENCOUNTER — Other Ambulatory Visit: Payer: Self-pay

## 2020-01-04 ENCOUNTER — Ambulatory Visit (HOSPITAL_BASED_OUTPATIENT_CLINIC_OR_DEPARTMENT_OTHER)
Admission: RE | Admit: 2020-01-04 | Discharge: 2020-01-04 | Disposition: A | Discharge status: Home / Self Care | Payer: Medicare Other | Source: Ambulatory Visit | Attending: Internal Medicine | Admitting: Internal Medicine

## 2020-01-04 VITALS — BP 132/63 | HR 71 | Wt 228.6 lb

## 2020-01-04 DIAGNOSIS — Z8249 Family history of ischemic heart disease and other diseases of the circulatory system: Secondary | ICD-10-CM | POA: Insufficient documentation

## 2020-01-04 DIAGNOSIS — Z7982 Long term (current) use of aspirin: Secondary | ICD-10-CM | POA: Diagnosis not present

## 2020-01-04 DIAGNOSIS — F419 Anxiety disorder, unspecified: Secondary | ICD-10-CM | POA: Insufficient documentation

## 2020-01-04 DIAGNOSIS — Z8673 Personal history of transient ischemic attack (TIA), and cerebral infarction without residual deficits: Secondary | ICD-10-CM | POA: Insufficient documentation

## 2020-01-04 DIAGNOSIS — I272 Pulmonary hypertension, unspecified: Secondary | ICD-10-CM | POA: Diagnosis not present

## 2020-01-04 DIAGNOSIS — I5042 Chronic combined systolic (congestive) and diastolic (congestive) heart failure: Secondary | ICD-10-CM | POA: Insufficient documentation

## 2020-01-04 DIAGNOSIS — G4733 Obstructive sleep apnea (adult) (pediatric): Secondary | ICD-10-CM | POA: Insufficient documentation

## 2020-01-04 DIAGNOSIS — Z87891 Personal history of nicotine dependence: Secondary | ICD-10-CM | POA: Diagnosis not present

## 2020-01-04 DIAGNOSIS — N183 Chronic kidney disease, stage 3 unspecified: Secondary | ICD-10-CM | POA: Diagnosis not present

## 2020-01-04 DIAGNOSIS — J9611 Chronic respiratory failure with hypoxia: Secondary | ICD-10-CM | POA: Insufficient documentation

## 2020-01-04 DIAGNOSIS — M109 Gout, unspecified: Secondary | ICD-10-CM | POA: Insufficient documentation

## 2020-01-04 DIAGNOSIS — I130 Hypertensive heart and chronic kidney disease with heart failure and stage 1 through stage 4 chronic kidney disease, or unspecified chronic kidney disease: Secondary | ICD-10-CM | POA: Insufficient documentation

## 2020-01-04 DIAGNOSIS — E785 Hyperlipidemia, unspecified: Secondary | ICD-10-CM | POA: Insufficient documentation

## 2020-01-04 DIAGNOSIS — Z79899 Other long term (current) drug therapy: Secondary | ICD-10-CM | POA: Diagnosis not present

## 2020-01-04 DIAGNOSIS — E1122 Type 2 diabetes mellitus with diabetic chronic kidney disease: Secondary | ICD-10-CM | POA: Insufficient documentation

## 2020-01-04 DIAGNOSIS — J449 Chronic obstructive pulmonary disease, unspecified: Secondary | ICD-10-CM | POA: Diagnosis not present

## 2020-01-04 DIAGNOSIS — I5022 Chronic systolic (congestive) heart failure: Secondary | ICD-10-CM

## 2020-01-04 DIAGNOSIS — I2510 Atherosclerotic heart disease of native coronary artery without angina pectoris: Secondary | ICD-10-CM | POA: Insufficient documentation

## 2020-01-04 DIAGNOSIS — I5032 Chronic diastolic (congestive) heart failure: Secondary | ICD-10-CM

## 2020-01-04 DIAGNOSIS — Z794 Long term (current) use of insulin: Secondary | ICD-10-CM | POA: Insufficient documentation

## 2020-01-04 DIAGNOSIS — R002 Palpitations: Secondary | ICD-10-CM | POA: Diagnosis not present

## 2020-01-04 DIAGNOSIS — R262 Difficulty in walking, not elsewhere classified: Secondary | ICD-10-CM | POA: Diagnosis not present

## 2020-01-04 DIAGNOSIS — I428 Other cardiomyopathies: Secondary | ICD-10-CM | POA: Diagnosis not present

## 2020-01-04 DIAGNOSIS — R0602 Shortness of breath: Secondary | ICD-10-CM | POA: Diagnosis not present

## 2020-01-04 NOTE — Patient Instructions (Signed)
EKG done today.  Your provider has recommended that  you wear a Zio Patch for 14 days.  This monitor will record your heart rhythm for our review.  IF you have any symptoms while wearing the monitor please press the button.  If you have any issues with the patch or you notice a red or orange light on it please call the company at [phone].  Once you remove the patch please mail it back to the company as soon as possible so we can get the results.  Please follow up with the [facility] in 3-4 months.  At the [facility], you and your health needs are our priority. As part of our continuing mission to provide you with exceptional heart care, we have created designated Provider Care Teams. These Care Teams include your primary Cardiologist (physician) and Advanced Practice Providers (APPs- Physician Assistants and Nurse Practitioners) who all work together to provide you with the care you need, when you need it.   You may see any of the following providers on your designated Care Team at your next follow up: Marland Kitchen Dr Glori Bickers . Dr Loralie Champagne . Darrick Grinder, NP . Lyda Jester, PA . Audry Riles, PharmD   Please be sure to bring in all your medications bottles to every appointment.

## 2020-01-04 NOTE — Progress Notes (Signed)
  Echocardiogram 2D Echocardiogram has been performed.  [name] 01/04/2020, 2:56 PM

## 2020-01-04 NOTE — Progress Notes (Addendum)
ADVANCED HF CLINIC NOTE  PCP: Primary Cardiologist: Dr Harrington Challenger HF MD; Dr Haroldine Laws  HPI: Angel Selmon Sprinkleis a 64 y.o.femalewith a hx of morbid obesity, chronic systolic/diastolic CHF, PSVT, prior CVA, HTN, DM, OSA, CKD with baseline creatinine 1.3-1.4, COPD with ongoing tobacco, pulmonary hypertensionand CSF rhinorrhea s/p skull base reconstruction.   Admitted with A/C diastolic heart failure. > 30 pound weight gain in a few weeks after her  PCP discontinued lasix  06/27/2019 secondary to worsening kidney function. In ER markedly volume overloaded. [name] was placed to guide therapy with initial swan numbers showing cardiac index 1.6 and elevated filling pressures. ECHO completed and showed biventricular HF with LVEF 25-30 and RV severely reduced. Placed on milrinone and diuresed with IV lasix. As she improved milrinone was weaned off. HF medications adjusted. Discharge weight was 236 pounds.   Returns for routine f/u. Has not smoked since 08/16/2019. Says breathing is worse over the last few weeks. Has had gout in her foot and unable to walk as much. Mild productive (white mucus) cough. No fevers or chills. No edema. No edema. + orthopnea. + frequent fluttering in chest. Lasting longer  Echo today (01/04/20)  EF 45-50% with mod RV dysfunction. Grade 2 DD. MV is thickened with mild MR/MS  Cardiac Testing Echo 08/18/19 EF 25-30% Mod LVH. RV dilated and severely HK. Personally reviewed  RHC/LHC 07/31/2019  Cardiac catheterization demonstrated minor nonobstructive CAD, mildly elevated LVEDP and moderate pulmonary hypertension. LAD 35% OM2 25% RA 10 PA 57/22 (39) PCWP 19 LVEDP 21 Fick 3.7/1.7   ROS: All systems negative except as listed in HPI, PMH and Problem List.  SH:  Social History   Socioeconomic History  . Marital status: Single    Spouse name: Not on file  . Number of children: 0  . Years of education: Not on file  . Highest education level: Not on file  Occupational  History  . Occupation: disabled  Tobacco Use  . Smoking status: Former Smoker    Packs/day: 0.50    Years: 35.00    Pack years: 17.50    Types: Cigarettes    Quit date: 08/16/2019    Years since quitting: 0.3  . Smokeless tobacco: Never Used  Substance and Sexual Activity  . Alcohol use: Yes    Alcohol/week: 1.0 standard drinks    Types: 1 Glasses of wine per week    Comment: rarely  . Drug use: No  . Sexual activity: Never    Birth control/protection: Surgical  Other Topics Concern  . Not on file  Social History Narrative   Currently unemployed.   No children, not married   Social Determinants of Radio broadcast assistant Strain:   . Difficulty of Paying Living Expenses: Not on file  Food Insecurity:   . Worried About Charity fundraiser in the Last Year: Not on file  . Ran Out of Food in the Last Year: Not on file  Transportation Needs:   . Lack of Transportation (Medical): Not on file  . Lack of Transportation (Non-Medical): Not on file  Physical Activity:   . Days of Exercise per Week: Not on file  . Minutes of Exercise per Session: Not on file  Stress:   . Feeling of Stress : Not on file  Social Connections:   . Frequency of Communication with Friends and Family: Not on file  . Frequency of Social Gatherings with Friends and Family: Not on file  . Attends Religious Services: Not on file  .  Active Member of Clubs or Organizations: Not on file  . Attends Archivist Meetings: Not on file  . Marital Status: Not on file  Intimate Partner Violence:   . Fear of Current or Ex-Partner: Not on file  . Emotionally Abused: Not on file  . Physically Abused: Not on file  . Sexually Abused: Not on file    FH:  Family History  Problem Relation Age of Onset  . Colon cancer Sister 77  . Breast cancer Mother 51  . Alzheimer's disease Mother   . Diabetes Sister 51  . Heart failure Sister   . Diabetes Sister 42  . Lung cancer Father   . Breast cancer Sister     . Cancer Sister        mouth  . Anesthesia problems Neg Hx   . Hypotension Neg Hx   . Malignant hyperthermia Neg Hx   . Pseudochol deficiency Neg Hx     Past Medical History:  Diagnosis Date  . Anxiety    doesn't take any meds for this  . Arteriosclerosis of coronary artery 08/31/2011   Myoview 9/12: EF 44, no ischemia // Myoview 8/18: EF 57, low risk, possible small area of inferior ischemia // Minor nonobstructive CAD by cardiac catheterization - LHC 8/18: Proximal LAD 35, OM2 25  . Arthritis    knuckles  . Asthma   . Carotid artery disease ([place])    Carotid US 7/14: Bilateral ICA 40-59 // Carotid US ([facility]) 2/17: bilat plaque without sig stenosis  . Chronic diastolic CHF (congestive heart failure) ([place]) 07/26/2017   Echo 7/18:Severe conc LVH, EF 60-65, no RWMA, Gr 2 DD, MAC, mild MR // right and left heart cath 8/18: LVEDP 21  . Claustrophobia 07/26/2012  . COPD (chronic obstructive pulmonary disease) ([place])    "chronic bronchitis - about every winter"  . CSF leak    dizziness/headache - assoc symptoms // 2/2 encephalocele s/p skull base reconstruction  . Diverticulosis   . History of CVA (cerebrovascular accident) 03/2004   denies residual 07/26/2012  . Hx of gout   . Hyperlipidemia    takes Pravastatin daily  . Hypertensive heart disease with chronic diastolic congestive heart failure ([place]) 07/26/2017  . Internal hemorrhoids   . Kidney stones   . LVH (left ventricular hypertrophy)    Echo 8/18: Severe conc LVH, EF 60-65, no RWMA, Gr 2 DD, MAC, mild MR  . OSA (obstructive sleep apnea) 07/26/2012   prior CPAP - stopped due to being primary caregiver for dying parent (stopped in 2016)  . PSVT (paroxysmal supraventricular tachycardia) ([place])   . Pulmonary hypertension, unspecified ([place]) 08/14/2017   cath 07/2017 >>PASP 32m Hg, PA mean 39 mm Hg  . Type II diabetes mellitus ([place]     Current Outpatient Medications  Medication Sig Dispense Refill  . acetaminophen (TYLENOL) 325  MG tablet Take 2 tablets (650 mg total) by mouth every 6 (six) hours as needed for mild pain. 90 tablet 2  . albuterol (VENTOLIN HFA) 108 (90 Base) MCG/ACT inhaler TAKE 2 PUFFS BY MOUTH EVERY 6 HOURS AS NEEDED FOR WHEEZE OR SHORTNESS OF BREATH 18 g 1  . allopurinol (ZYLOPRIM) 100 MG tablet Take 100 mg by mouth daily.     .Marland Kitchenaspirin EC 81 MG tablet Take 1 tablet (81 mg total) by mouth daily. 90 tablet 3  . benzonatate (TESSALON) 100 MG capsule Take 1 capsule (100 mg total) by mouth every 8 (eight) hours. 21 capsule 0  .  Blood Glucose Monitoring Suppl (ONETOUCH VERIO) w/Device KIT USE TO CHECK BLOOD GLUCOSE 4 TIMES A DAY . DIAGNOSIS: TYPE 2 DIABETES E11.59 1 kit 0  . busPIRone (BUSPAR) 15 MG tablet Take 15 mg by mouth 2 (two) times daily.     . CHANTIX STARTING MONTH PAK 0.5 MG X 11 & 1 MG X 42 tablet TAKE AS DIRECTED ON PACKAGE 53 each 0  . empagliflozin (JARDIANCE) 10 MG TABS tablet Take 10 mg by mouth daily before breakfast. 30 tablet 11  . FLUoxetine (PROZAC) 20 MG capsule TAKE 1 CAPSULE (20 MG TOTAL) BY MOUTH AT BEDTIME. 90 capsule 2  . gabapentin (NEURONTIN) 400 MG capsule Take 1 capsule (400 mg total) by mouth at bedtime.    Marland Kitchen glucose blood (ONETOUCH VERIO) test strip For E11.22.  Use to check glucose qid 100 strip 12  . hydrALAZINE (APRESOLINE) 50 MG tablet Take 1 tablet (50 mg total) by mouth every 8 (eight) hours. 270 tablet 3  . insulin aspart (NOVOLOG FLEXPEN) 100 UNIT/ML FlexPen Inject 5 Units into the skin 3 (three) times daily with meals. Dx: E11.9 15 mL   . Insulin Glargine 300 UNIT/ML SOPN Inject 60 Units into the skin daily.    . isosorbide mononitrate (IMDUR) 60 MG 24 hr tablet Take 1 tablet (60 mg total) by mouth daily. ** DO NOT CRUSH ** 30 tablet 11  . ketorolac (ACULAR) 0.5 % ophthalmic solution     . nitroGLYCERIN (NITROSTAT) 0.4 MG SL tablet Place 1 tablet (0.4 mg total) under the tongue every 5 (five) minutes as needed. 25 tablet 3  . ofloxacin (OCUFLOX) 0.3 % ophthalmic  solution     . omeprazole (PRILOSEC) 40 MG capsule TAKE 1 CAPSULE BY MOUTH 30- 60 MIN BEFORE YOUR FIRST AND LAST MEALS OF THE DAY 180 capsule 0  . potassium chloride SA (KLOR-CON M20) 20 MEQ tablet TAKE 1 TABLET DAILY ONLY WHEN DIRECTED BY THE HEART FAILURE CLINIC 90 tablet 1  . prednisoLONE acetate (PRED FORTE) 1 % ophthalmic suspension     . rosuvastatin (CRESTOR) 20 MG tablet Take 1 tablet (20 mg total) by mouth daily. 90 tablet 2  . sacubitril-valsartan (ENTRESTO) 24-26 MG Take 1 tablet by mouth 2 (two) times daily. 60 tablet 6  . topiramate (TOPAMAX) 25 MG tablet TAKE 1 TABLET BY MOUTH TWICE A DAY 180 tablet 1  . torsemide (DEMADEX) 20 MG tablet Take 1 tablet (20 mg total) by mouth every Monday, Wednesday, and Friday. 15 tablet 0  . Verapamil HCl CR 300 MG CP24      No current facility-administered medications for this encounter.    Vitals:   01/04/20 1502  BP: 132/63  Pulse: 71  SpO2: 96%  Weight: 103.7 kg (228 lb 9.6 oz)   Wt Readings from Last 3 Encounters:  12/14/19 103.9 kg (229 lb)  11/29/19 102.2 kg (225 lb 3.2 oz)  11/15/19 102.2 kg (225 lb 6.4 oz)    PHYSICAL EXAM: General:  Obese woman. No resp difficulty. Hoarse voice HEENT: normal Neck: supple. no obvious JVD. Carotids 2+ bilat; no bruits. No lymphadenopathy or thryomegaly appreciated. Cor: PMI nondisplaced. Regular rate & rhythm. No rubs, gallops or murmurs. Lungs: decreased air movement. No wheeze Abdomen: obese soft, nontender, nondistended. No hepatosplenomegaly. No bruits or masses. Good bowel sounds. Extremities: no cyanosis, clubbing, rash, edema Neuro: alert & orientedx3, cranial nerves grossly intact. moves all 4 extremities w/o difficulty. Affect pleasant    ASSESSMENT & PLAN:  1. Chronic biventricular HF  due to NICM - Echo 9/20 EF 25-30% (new). RV severely HK  - Cath 8/20 with minimal CAD - Echo today (01/04/20)  EF 45-50% with mod RV dysfunction. Grade 2 DD. MV is thickened with mild MR/MS -  NYHA III confounded by obesity and COPD - Increase Entresto to 49/51 bid - Continue hydralazine 50 mg three times a day + 30 mg imdur.  - Fluid status doesn't look to bad. Taking torsemide 20 daily. Can take extra as needed  - Has not been on b-blocker with wheezing - Continue Jardiance  2. Chronic hypoxic respiratory failure - stable. Follows with Dr. Valeta Harms in [place]  3. PAH, mild to moderate - likely who GROUP II & III - not candidate for selective pulmonary vasodilators  4. CKD Stage III - baseline creatinine 1.4-1.7 - check labs today  5. COPD - Quit smoking since 08/16/19.  - Congratulated her  6. Morbid obesity - continue weight loss efforts   7. OSA - pending CPAP with Dr. Radford Pax - Has sleep apnea but has not used CPAP.  - Recently got CPAP machine but waiting to get the distilled water - I stressed need for treatment   8. DM2 - on SGLT2i  9. Palpitations - Place zio patch   Glori Bickers MD  2:57 PM

## 2020-01-05 DIAGNOSIS — H2512 Age-related nuclear cataract, left eye: Secondary | ICD-10-CM | POA: Diagnosis not present

## 2020-01-06 NOTE — Progress Notes (Signed)
Zio patch placed onto patient.  All instructions and information reviewed with patient, they verbalize understanding with no questions. 

## 2020-01-20 ENCOUNTER — Ambulatory Visit: Payer: Medicare Other

## 2020-01-22 DIAGNOSIS — H2511 Age-related nuclear cataract, right eye: Secondary | ICD-10-CM | POA: Diagnosis not present

## 2020-01-25 ENCOUNTER — Other Ambulatory Visit (HOSPITAL_COMMUNITY): Payer: Self-pay | Admitting: Internal Medicine

## 2020-01-30 DIAGNOSIS — R002 Palpitations: Secondary | ICD-10-CM | POA: Diagnosis not present

## 2020-02-09 DIAGNOSIS — H2511 Age-related nuclear cataract, right eye: Secondary | ICD-10-CM | POA: Diagnosis not present

## 2020-02-14 NOTE — Addendum Note (Signed)
Encounter addended by: Micki Riley, RN on: 02/14/2020 2:48 PM  Actions taken: Imaging Exam ended

## 2020-02-16 ENCOUNTER — Encounter: Payer: Self-pay | Admitting: Podiatry

## 2020-02-16 ENCOUNTER — Ambulatory Visit (INDEPENDENT_AMBULATORY_CARE_PROVIDER_SITE_OTHER): Payer: Medicare Other | Admitting: Podiatry

## 2020-02-16 ENCOUNTER — Other Ambulatory Visit: Payer: Self-pay

## 2020-02-16 DIAGNOSIS — L600 Ingrowing nail: Secondary | ICD-10-CM | POA: Diagnosis not present

## 2020-02-16 DIAGNOSIS — B351 Tinea unguium: Secondary | ICD-10-CM | POA: Diagnosis not present

## 2020-02-16 DIAGNOSIS — M79676 Pain in unspecified toe(s): Secondary | ICD-10-CM

## 2020-02-16 DIAGNOSIS — E1140 Type 2 diabetes mellitus with diabetic neuropathy, unspecified: Secondary | ICD-10-CM

## 2020-02-16 MED ORDER — DOXYCYCLINE HYCLATE 100 MG PO TABS: 100.0000 mg | ORAL_TABLET | Freq: Two times a day (BID) | ORAL | 0 refills | Status: DC

## 2020-02-23 NOTE — Progress Notes (Signed)
Subjective: [name], 64 year old female, returns the office today for painful, elongated, thickened toenails which she cannot trim herself. Denies any redness or drainage around the nails. Denies any acute changes since last appointment and no new complaints today. Denies any systemic complaints such as fevers, chills, nausea, vomiting.   Objective: AAO 3, NAD DP/PT pulses palpable, CRT less than 3 seconds Sensation decreased with SWMF.  Nails hypertrophic, dystrophic, elongated, brittle, discolored 9.  At the base of the left second toenail there is a blistered and localized erythema.  There is no ascending cellulitis.  No pain.  There is tenderness overlying the nails 1-5 bilaterally except the left hallux nail which has previously been removed. There is no surrounding erythema or drainage along the nail sites.  No open lesions or pre-ulcerative lesions are identified. No pain with calf compression, swelling, warmth, erythema. Overall doing well without any changes.   Assessment: Patient presents with symptomatic onychomycosis; neuropathy; localized infection left second digit toenail  Plan: -Treatment options including alternatives, risks, complications were discussed -Nails sharply debrided 9 without complication/bleeding.  -Localized infection present to the left second toe proximal nail fold.  Blister was present which I did drain today for pain with alcohol.  Prescribed doxycycline.  Antibiotic ointment dressing changes daily. Monitor for any clinical signs or symptoms of infection and directed to call the office immediately should any occur or go to the ER. -Discussed daily foot inspection. If there are any changes, to call the office immediately.  -Follow-up as scheduled or sooner if any problems are to arise. In the meantime, encouraged to call the office with any questions, concerns, changes symptoms.  Trula Slade DPM

## 2020-02-24 ENCOUNTER — Ambulatory Visit: Payer: Medicare Other

## 2020-03-01 ENCOUNTER — Telehealth (HOSPITAL_COMMUNITY): Payer: Self-pay | Admitting: *Deleted

## 2020-03-01 ENCOUNTER — Other Ambulatory Visit: Payer: Self-pay

## 2020-03-01 ENCOUNTER — Ambulatory Visit (INDEPENDENT_AMBULATORY_CARE_PROVIDER_SITE_OTHER): Payer: Medicare Other | Admitting: Podiatry

## 2020-03-01 DIAGNOSIS — L600 Ingrowing nail: Secondary | ICD-10-CM | POA: Diagnosis not present

## 2020-03-01 DIAGNOSIS — E1140 Type 2 diabetes mellitus with diabetic neuropathy, unspecified: Secondary | ICD-10-CM

## 2020-03-01 DIAGNOSIS — I471 Supraventricular tachycardia: Secondary | ICD-10-CM

## 2020-03-01 NOTE — Telephone Encounter (Signed)
Spoke w/pt, she is aware and agreeable w/referral

## 2020-03-01 NOTE — Telephone Encounter (Signed)
-----   Message from Jolaine Artist, MD sent at 02/26/2020  9:48 PM EDT ----- Please refer to EP to evaluate for frequent SVT.

## 2020-03-05 NOTE — Progress Notes (Signed)
Subjective: 64 year old female presents the office for follow-up evaluation of her annual check on the left second toe.  She is unsure a lot better.  She is in occasional pain but overall much improved.  She is concerned with some dried blood within the nail.  She did take the antibiotic with any side effects.  Max her pain level is 2/10.  No recent injury or changes otherwise.  Denies any systemic complaints such as fevers, chills, nausea, vomiting. No acute changes since last appointment, and no other complaints at this time.   Objective: AAO x3, NAD DP/PT pulses palpable bilaterally, CRT less than 3 seconds Erythema that was present along the left second digit proximal nail fold has resolved.  There was some dried blood present with loss of the nail which was able to debride without any complications or bleeding.  No pain on exam today. No open lesions or pre-ulcerative lesions.  No pain with calf compression, swelling, warmth, erythema  Assessment: Resolved infection left second digit toenail  Plan: -All treatment options discussed with the patient including all alternatives, risks, complications.  -Debrided the nail with any complications or bleeding.  Continue small amounts of antibiotic ointment dressing changes daily.  Monitor for any reoccurrence. -Patient encouraged to call the office with any questions, concerns, change in symptoms.   RTC in about 2 months for routine care or sooner if needed  Trula Slade DPM

## 2020-03-08 ENCOUNTER — Encounter: Payer: Self-pay | Admitting: Internal Medicine

## 2020-03-09 ENCOUNTER — Institutional Professional Consult (permissible substitution): Payer: Medicare Other | Admitting: Internal Medicine

## 2020-03-13 ENCOUNTER — Ambulatory Visit: Payer: Medicare Other

## 2020-03-14 ENCOUNTER — Ambulatory Visit (INDEPENDENT_AMBULATORY_CARE_PROVIDER_SITE_OTHER): Payer: Medicare Other | Admitting: Podiatrist

## 2020-03-14 ENCOUNTER — Other Ambulatory Visit: Payer: Self-pay

## 2020-03-14 ENCOUNTER — Ambulatory Visit (INDEPENDENT_AMBULATORY_CARE_PROVIDER_SITE_OTHER): Payer: Medicare Other

## 2020-03-14 ENCOUNTER — Other Ambulatory Visit: Payer: Self-pay | Admitting: Podiatrist

## 2020-03-14 DIAGNOSIS — S93491A Sprain of other ligament of right ankle, initial encounter: Secondary | ICD-10-CM

## 2020-03-14 DIAGNOSIS — M79671 Pain in right foot: Secondary | ICD-10-CM | POA: Diagnosis not present

## 2020-03-14 DIAGNOSIS — S93601A Unspecified sprain of right foot, initial encounter: Secondary | ICD-10-CM

## 2020-03-14 NOTE — Progress Notes (Signed)
  Chief Complaint  Patient presents with  . Foot Injury    Pt states slipped and fell 1 week ago and injured right foot/ankle. Pt states there is swelling and pain all over, but that it has gradually been improving.     HPI: Patient is 64 y.o. female who presents today for  Pain in the right foot and ankle region after a slip and fall x 1 week duration.  She has tried icing and wrapping in a compressive dressing with some improvement in symptoms.   Review of Systems  DATA OBTAINED: from patient  GENERAL: Feels well no fevers, no fatigue, no changes in appetite SKIN: No itching, no rashes, no open wounds EYES: No eye pain,no redness, no discharge EARS: No earache,no ringing of ears, NOSE: No congestion, no drainage, no bleeding  MOUTH/THROAT: No mouth pain, No sore throat, No difficulty chewing or swallowing  RESPIRATORY: No cough, no wheezing, no SOB CARDIAC: No chest pain,no heart palpitations, GI: No abdominal pain, No Nausea, no vomiting, no diarrhea, no heartburn or no reflux  GU: No dysuria, no increased frequency or urgency MUSCULOSKELETAL: No unrelieved bone/joint pain,  NEUROLOGIC: Awake, alert, appropriate to situation, No change in mental status. PSYCHIATRIC: No overt anxiety or sadness.No behavior issue.      Physical Exam  GENERAL APPEARANCE: Alert, conversant. Appropriately groomed. No acute distress.   VASCULAR: Pedal pulses palpable DP and PT bilateral.  Capillary refill time is immediate to all digits,  Proximal to distal cooling it warm to warm.  Digital hair growth is present bilateral   NEUROLOGIC: sensation is intact epicritically and protectively to 5.07 monofilament at 5/5 sites bilateral.  Light touch is intact bilateral, vibratory sensation intact bilateral, achilles tendon reflex is intact bilateral.   MUSCULOSKELETAL: acceptable muscle strength, tone and stability bilateral.  Intrinsic muscluature intact bilateral.  Pain along the calcaneal fibular  ligament and anterior talofibular ligament is palpated.  Negative anterior drawer sign noted. Negative inversion stress test noted.    DERMATOLOGIC: skin is warm, supple, and dry.  No open lesions noted.  No interdigital maceration noted bilateral.  Digital nails are asymptomatifc.    Xray - 3 views right foot and 2 views right ankle are obtained.  No sign of osseous fracture or dislocation noted.  tib-fib overlap on ap ankle is normal,  No sign of fracture seen on foot xrays.  Fifth metatarsal base is normal.  Moderate swelling lateral ankle seen on xray.  Osseous mineralization normal.  Inferior and posterior heel spur seen.      Assessment   Right ankle sprain  Plan  Recommended an air fracture walker and this was dispensed for her use.  She will begin the at home physical therapy for ankle sprains and instructions given.  She will wear the boot for 3-5 weeks and will wean out into a supportive ankle brace.  She will return for a recheck if her ankle is still painful in 4 weeks.

## 2020-03-16 ENCOUNTER — Encounter: Payer: Self-pay | Admitting: Internal Medicine

## 2020-03-18 ENCOUNTER — Encounter: Payer: Self-pay | Admitting: Podiatrist

## 2020-03-20 ENCOUNTER — Other Ambulatory Visit: Payer: Self-pay | Admitting: Family Medicine

## 2020-04-03 ENCOUNTER — Encounter (HOSPITAL_COMMUNITY): Payer: Self-pay

## 2020-04-03 ENCOUNTER — Encounter: Payer: Self-pay | Admitting: Internal Medicine

## 2020-04-03 ENCOUNTER — Ambulatory Visit (INDEPENDENT_AMBULATORY_CARE_PROVIDER_SITE_OTHER): Payer: Medicare Other | Admitting: Internal Medicine

## 2020-04-03 ENCOUNTER — Other Ambulatory Visit: Payer: Self-pay

## 2020-04-03 ENCOUNTER — Ambulatory Visit (HOSPITAL_COMMUNITY)
Admission: RE | Admit: 2020-04-03 | Discharge: 2020-04-03 | Disposition: A | Discharge status: Home / Self Care | Payer: Medicare Other | Source: Ambulatory Visit | Attending: Cardiology | Admitting: Cardiology

## 2020-04-03 VITALS — BP 144/88 | HR 78 | Wt 232.0 lb

## 2020-04-03 VITALS — BP 138/68 | HR 77 | Ht 63.0 in | Wt 233.6 lb

## 2020-04-03 DIAGNOSIS — Z794 Long term (current) use of insulin: Secondary | ICD-10-CM | POA: Insufficient documentation

## 2020-04-03 DIAGNOSIS — Z79899 Other long term (current) drug therapy: Secondary | ICD-10-CM | POA: Insufficient documentation

## 2020-04-03 DIAGNOSIS — M1990 Unspecified osteoarthritis, unspecified site: Secondary | ICD-10-CM | POA: Insufficient documentation

## 2020-04-03 DIAGNOSIS — Z7982 Long term (current) use of aspirin: Secondary | ICD-10-CM | POA: Insufficient documentation

## 2020-04-03 DIAGNOSIS — I471 Supraventricular tachycardia: Secondary | ICD-10-CM | POA: Diagnosis not present

## 2020-04-03 DIAGNOSIS — N183 Chronic kidney disease, stage 3 unspecified: Secondary | ICD-10-CM | POA: Diagnosis not present

## 2020-04-03 DIAGNOSIS — I5022 Chronic systolic (congestive) heart failure: Secondary | ICD-10-CM

## 2020-04-03 DIAGNOSIS — Z87891 Personal history of nicotine dependence: Secondary | ICD-10-CM | POA: Diagnosis not present

## 2020-04-03 DIAGNOSIS — I5082 Biventricular heart failure: Secondary | ICD-10-CM | POA: Diagnosis not present

## 2020-04-03 DIAGNOSIS — I5042 Chronic combined systolic (congestive) and diastolic (congestive) heart failure: Secondary | ICD-10-CM | POA: Insufficient documentation

## 2020-04-03 DIAGNOSIS — I130 Hypertensive heart and chronic kidney disease with heart failure and stage 1 through stage 4 chronic kidney disease, or unspecified chronic kidney disease: Secondary | ICD-10-CM | POA: Insufficient documentation

## 2020-04-03 DIAGNOSIS — I428 Other cardiomyopathies: Secondary | ICD-10-CM | POA: Diagnosis not present

## 2020-04-03 DIAGNOSIS — Z8673 Personal history of transient ischemic attack (TIA), and cerebral infarction without residual deficits: Secondary | ICD-10-CM | POA: Insufficient documentation

## 2020-04-03 DIAGNOSIS — I272 Pulmonary hypertension, unspecified: Secondary | ICD-10-CM | POA: Diagnosis not present

## 2020-04-03 DIAGNOSIS — F419 Anxiety disorder, unspecified: Secondary | ICD-10-CM | POA: Diagnosis not present

## 2020-04-03 DIAGNOSIS — J9611 Chronic respiratory failure with hypoxia: Secondary | ICD-10-CM | POA: Diagnosis not present

## 2020-04-03 DIAGNOSIS — J449 Chronic obstructive pulmonary disease, unspecified: Secondary | ICD-10-CM | POA: Insufficient documentation

## 2020-04-03 DIAGNOSIS — E785 Hyperlipidemia, unspecified: Secondary | ICD-10-CM | POA: Insufficient documentation

## 2020-04-03 DIAGNOSIS — I2510 Atherosclerotic heart disease of native coronary artery without angina pectoris: Secondary | ICD-10-CM | POA: Insufficient documentation

## 2020-04-03 DIAGNOSIS — Z6841 Body Mass Index (BMI) 40.0 and over, adult: Secondary | ICD-10-CM | POA: Diagnosis not present

## 2020-04-03 DIAGNOSIS — R002 Palpitations: Secondary | ICD-10-CM | POA: Diagnosis not present

## 2020-04-03 DIAGNOSIS — G4733 Obstructive sleep apnea (adult) (pediatric): Secondary | ICD-10-CM | POA: Insufficient documentation

## 2020-04-03 DIAGNOSIS — E1122 Type 2 diabetes mellitus with diabetic chronic kidney disease: Secondary | ICD-10-CM | POA: Diagnosis not present

## 2020-04-03 LAB — BASIC METABOLIC PANEL
Anion gap: 10 (ref 5–15)
BUN: 34 mg/dL — ABNORMAL HIGH (ref 8–23)
CO2: 28 mmol/L (ref 22–32)
Calcium: 8.5 mg/dL — ABNORMAL LOW (ref 8.9–10.3)
Chloride: 105 mmol/L (ref 98–111)
Creatinine, Ser: 2.16 mg/dL — ABNORMAL HIGH (ref 0.44–1.00)
GFR calc Af Amer: 28 mL/min — ABNORMAL LOW (ref >60)
GFR calc non Af Amer: 24 mL/min — ABNORMAL LOW (ref >60)
Glucose, Bld: 127 mg/dL — ABNORMAL HIGH (ref 70–99)
Potassium: 4.6 mmol/L (ref 3.5–5.1)
Sodium: 143 mmol/L (ref 135–145)

## 2020-04-03 NOTE — Progress Notes (Signed)
ReDS Vest / Clip - 04/03/20 1400      ReDS Vest / Clip   Station Marker  B    Ruler Value  35    ReDS Value Range  Moderate volume overload    ReDS Actual Value  40    Anatomical Comments  sitting

## 2020-04-03 NOTE — Patient Instructions (Signed)
Please call the office and let us know what dose of Entresto you are on.  Labs today and repeat in 10 days We will only contact you if something comes back abnormal or we need to make some changes. Otherwise no news is good news!  Your physician recommends that you schedule a follow-up appointment in: 3 months with Dr Haroldine Laws  Please call office at [phone] option 2 if you have any questions or concerns.   At the [facility], you and your health needs are our priority. As part of our continuing mission to provide you with exceptional heart care, we have created designated Provider Care Teams. These Care Teams include your primary Cardiologist (physician) and Advanced Practice Providers (APPs- Physician Assistants and Nurse Practitioners) who all work together to provide you with the care you need, when you need it.   You may see any of the following providers on your designated Care Team at your next follow up: Marland Kitchen Dr Glori Bickers . Dr Loralie Champagne . Darrick Grinder, NP . Lyda Jester, PA . Audry Riles, PharmD   Please be sure to bring in all your medications bottles to every appointment.

## 2020-04-03 NOTE — Patient Instructions (Addendum)
Medication Instructions:  Your physician recommends that you continue on your current medications as directed. Please refer to the Current Medication list given to you today.  Labwork: None ordered.  Testing/Procedures: Your physician has recommended that you have an ablation. Catheter ablation is a medical procedure used to treat some cardiac arrhythmias (irregular heartbeats). During catheter ablation, a long, thin, flexible tube is put into a blood vessel in your groin (upper thigh), or neck. This tube is called an ablation catheter. It is then guided to your heart through the blood vessel. Radio frequency waves destroy small areas of heart tissue where abnormal heartbeats may cause an arrhythmia to start. Please see the instruction sheet given to you today.  Follow-Up:  The following dates are available for procedures:  May 3, 5, 10, 13 June 1, 3, 10, 11, 14, 18  Any Other Special Instructions Will Be Listed Below (If Applicable).  If you need a refill on your cardiac medications before your next appointment, please call your pharmacy.    Cardiac Ablation Cardiac ablation is a procedure to disable (ablate) a small amount of heart tissue in very specific places. The heart has many electrical connections. Sometimes these connections are abnormal and can cause the heart to beat very fast or irregularly. Ablating some of the problem areas can improve the heart rhythm or return it to normal. Ablation may be done for people who:  Have Wolff-Parkinson-White syndrome.  Have fast heart rhythms (tachycardia).  Have taken medicines for an abnormal heart rhythm (arrhythmia) that were not effective or caused side effects.  Have a high-risk heartbeat that may be life-threatening. During the procedure, a small incision is made in the neck or the groin, and a long, thin, flexible tube (catheter) is inserted into the incision and moved to the heart. Small devices (electrodes) on the tip of the  catheter will send out electrical currents. A type of X-ray (fluoroscopy) will be used to help guide the catheter and to provide images of the heart. Tell a health care provider about:  Any allergies you have.  All medicines you are taking, including vitamins, herbs, eye drops, creams, and over-the-counter medicines.  Any problems you or family members have had with anesthetic medicines.  Any blood disorders you have.  Any surgeries you have had.  Any medical conditions you have, such as kidney failure.  Whether you are pregnant or may be pregnant. What are the risks? Generally, this is a safe procedure. However, problems may occur, including:  Infection.  Bruising and bleeding at the catheter insertion site.  Bleeding into the chest, especially into the sac that surrounds the heart. This is a serious complication.  Stroke or blood clots.  Damage to other structures or organs.  Allergic reaction to medicines or dyes.  Need for a permanent pacemaker if the normal electrical system is damaged. A pacemaker is a small computer that sends electrical signals to the heart and helps your heart beat normally.  The procedure not being fully effective. This may not be recognized until months later. Repeat ablation procedures are sometimes required. What happens before the procedure?  Follow instructions from your health care provider about eating or drinking restrictions.  Ask your health care provider about: ? Changing or stopping your regular medicines. This is especially important if you are taking diabetes medicines or blood thinners. ? Taking medicines such as aspirin and ibuprofen. These medicines can thin your blood. Do not take these medicines before your procedure if your health care  provider instructs you not to.  Plan to have someone take you home from the hospital or clinic.  If you will be going home right after the procedure, plan to have someone with you for 24  hours. What happens during the procedure?  To lower your risk of infection: ? Your health care team will wash or sanitize their hands. ? Your skin will be washed with soap. ? Hair may be removed from the incision area.  An IV tube will be inserted into one of your veins.  You will be given a medicine to help you relax (sedative).  The skin on your neck or groin will be numbed.  An incision will be made in your neck or your groin.  A needle will be inserted through the incision and into a large vein in your neck or groin.  A catheter will be inserted into the needle and moved to your heart.  Dye may be injected through the catheter to help your surgeon see the area of the heart that needs treatment.  Electrical currents will be sent from the catheter to ablate heart tissue in desired areas. There are three types of energy that may be used to ablate heart tissue: ? Heat (radiofrequency energy). ? Laser energy. ? Extreme cold (cryoablation).  When the necessary tissue has been ablated, the catheter will be removed.  Pressure will be held on the catheter insertion area to prevent excessive bleeding.  A bandage (dressing) will be placed over the catheter insertion area. The procedure may vary among health care providers and hospitals. What happens after the procedure?  Your blood pressure, heart rate, breathing rate, and blood oxygen level will be monitored until the medicines you were given have worn off.  Your catheter insertion area will be monitored for bleeding. You will need to lie still for a few hours to ensure that you do not bleed from the catheter insertion area.  Do not drive for 24 hours or as long as directed by your health care provider. Summary  Cardiac ablation is a procedure to disable (ablate) a small amount of heart tissue in very specific places. Ablating some of the problem areas can improve the heart rhythm or return it to normal.  During the procedure,  electrical currents will be sent from the catheter to ablate heart tissue in desired areas. This information is not intended to replace advice given to you by your health care provider. Make sure you discuss any questions you have with your health care provider. Document Revised: 05/17/2018 Document Reviewed: 10/13/2016 [employer] Patient Education  [place].

## 2020-04-03 NOTE — Progress Notes (Signed)
ADVANCED HF CLINIC NOTE  PCP: Primary Cardiologist: Dr Harrington Challenger HF MD; Dr Haroldine Laws  HPI: Angel Elsayed Sprinkleis a 64 y.o.femalewith a hx of morbid obesity, chronic systolic/diastolic CHF, PSVT, prior CVA, HTN, DM, OSA, CKD with baseline creatinine 1.3-1.4, COPD with ongoing tobacco, pulmonary hypertensionand CSF rhinorrhea s/p skull base reconstruction.   Admitted with A/C diastolic heart failure. > 30 pound weight gain in a few weeks after her  PCP discontinued lasix  06/27/2019 secondary to worsening kidney function. In ER markedly volume overloaded. [name] was placed to guide therapy with initial swan numbers showing cardiac index 1.6 and elevated filling pressures. ECHO completed and showed biventricular HF with LVEF 25-30 and RV severely reduced. Placed on milrinone and diuresed with IV lasix. As she improved milrinone was weaned off. HF medications adjusted. Discharge weight was 236 pounds.   Echo 1/21 showed  EF 45-50% with mod RV dysfunction. Grade 2 DD. MV is thickened with mild MR/MS  At last visit w/ Dr. Haroldine Laws, [name] was increased to 49-51. She also complained of tachy palpitations. Zio patch showed frequent runs of SVT. Was referred to Dr. Lovena Le w/ EP. Seen earlier today. Note incomplete but she was offered catheter ablation.  She presents back to clinic for f/u. She feels that she is retaining fluid. Wt up 5 lb from previous office wt. ReDs clip elevated at 40%.  SOB w/ physical activity. No resting symptoms. She admits that she often skipd her torsemide on the weekends and has also been eating more fast food for lunch after starting a new job.     Cardiac Testing Echo 08/18/19 EF 25-30% Mod LVH. RV dilated and severely HK. Personally reviewed Echo 1/21 EF 45-50% with mod RV dysfunction. Grade 2 DD. MV is thickened with mild MR/MS  RHC/LHC 07/31/2019  Cardiac catheterization demonstrated minor nonobstructive CAD, mildly elevated LVEDP and moderate pulmonary  hypertension. LAD 35% OM2 25% RA 10 PA 57/22 (39) PCWP 19 LVEDP 21 Fick 3.7/1.7   ROS: All systems negative except as listed in HPI, PMH and Problem List.  SH:  Social History   Socioeconomic History  . Marital status: Single    Spouse name: Not on file  . Number of children: 0  . Years of education: Not on file  . Highest education level: Not on file  Occupational History  . Occupation: disabled  Tobacco Use  . Smoking status: Former Smoker    Packs/day: 0.50    Years: 35.00    Pack years: 17.50    Types: Cigarettes    Quit date: 08/16/2019    Years since quitting: 0.6  . Smokeless tobacco: Never Used  Substance and Sexual Activity  . Alcohol use: Yes    Alcohol/week: 1.0 standard drinks    Types: 1 Glasses of wine per week    Comment: rarely  . Drug use: No  . Sexual activity: Never    Birth control/protection: Surgical  Other Topics Concern  . Not on file  Social History Narrative   Currently unemployed.   No children, not married   Social Determinants of Radio broadcast assistant Strain:   . Difficulty of Paying Living Expenses:   Food Insecurity:   . Worried About Charity fundraiser in the Last Year:   . Arboriculturist in the Last Year:   Transportation Needs:   . Film/video editor (Medical):   Marland Kitchen Lack of Transportation (Non-Medical):   Physical Activity:   . Days of Exercise per Week:   .  Minutes of Exercise per Session:   Stress:   . Feeling of Stress :   Social Connections:   . Frequency of Communication with Friends and Family:   . Frequency of Social Gatherings with Friends and Family:   . Attends Religious Services:   . Active Member of Clubs or Organizations:   . Attends Archivist Meetings:   Marland Kitchen Marital Status:   Intimate Partner Violence:   . Fear of Current or Ex-Partner:   . Emotionally Abused:   Marland Kitchen Physically Abused:   . Sexually Abused:     FH:  Family History  Problem Relation Age of Onset  . Colon cancer  Sister 70  . Breast cancer Mother 29  . Alzheimer's disease Mother   . Diabetes Sister 27  . Heart failure Sister   . Diabetes Sister 54  . Lung cancer Father   . Breast cancer Sister   . Cancer Sister        mouth  . Anesthesia problems Neg Hx   . Hypotension Neg Hx   . Malignant hyperthermia Neg Hx   . Pseudochol deficiency Neg Hx     Past Medical History:  Diagnosis Date  . Anxiety    doesn't take any meds for this  . Arteriosclerosis of coronary artery 08/31/2011   Myoview 9/12: EF 44, no ischemia // Myoview 8/18: EF 57, low risk, possible small area of inferior ischemia // Minor nonobstructive CAD by cardiac catheterization - LHC 8/18: Proximal LAD 35, OM2 25  . Arthritis    knuckles  . Asthma   . Carotid artery disease ([place])    Carotid US 7/14: Bilateral ICA 40-59 // Carotid US ([facility]) 2/17: bilat plaque without sig stenosis  . Chronic diastolic CHF (congestive heart failure) ([place]) 07/26/2017   Echo 7/18:Severe conc LVH, EF 60-65, no RWMA, Gr 2 DD, MAC, mild MR // right and left heart cath 8/18: LVEDP 21  . Claustrophobia 07/26/2012  . COPD (chronic obstructive pulmonary disease) ([place])    "chronic bronchitis - about every winter"  . CSF leak    dizziness/headache - assoc symptoms // 2/2 encephalocele s/p skull base reconstruction  . Diverticulosis   . History of CVA (cerebrovascular accident) 03/2004   denies residual 07/26/2012  . Hx of gout   . Hyperlipidemia    takes Pravastatin daily  . Hypertensive heart disease with chronic diastolic congestive heart failure ([place]) 07/26/2017  . Internal hemorrhoids   . Kidney stones   . LVH (left ventricular hypertrophy)    Echo 8/18: Severe conc LVH, EF 60-65, no RWMA, Gr 2 DD, MAC, mild MR  . OSA (obstructive sleep apnea) 07/26/2012   prior CPAP - stopped due to being primary caregiver for dying parent (stopped in 2016)  . PSVT (paroxysmal supraventricular tachycardia) ([place])   . Pulmonary hypertension, unspecified ([place])  08/14/2017   cath 07/2017 >>PASP 39m Hg, PA mean 39 mm Hg  . Type II diabetes mellitus ([place]     Current Outpatient Medications  Medication Sig Dispense Refill  . acetaminophen (TYLENOL) 325 MG tablet Take 2 tablets (650 mg total) by mouth every 6 (six) hours as needed for mild pain. 90 tablet 2  . albuterol (VENTOLIN HFA) 108 (90 Base) MCG/ACT inhaler TAKE 2 PUFFS BY MOUTH EVERY 6 HOURS AS NEEDED FOR WHEEZE OR SHORTNESS OF BREATH 18 g 1  . allopurinol (ZYLOPRIM) 100 MG tablet Take 100 mg by mouth daily.     .Marland Kitchenaspirin EC 81 MG tablet Take  1 tablet (81 mg total) by mouth daily. 90 tablet 3  . benzonatate (TESSALON) 100 MG capsule Take 1 capsule (100 mg total) by mouth every 8 (eight) hours. 21 capsule 0  . Blood Glucose Monitoring Suppl (ONETOUCH VERIO) w/Device KIT USE TO CHECK BLOOD GLUCOSE 4 TIMES A DAY . DIAGNOSIS: TYPE 2 DIABETES E11.59 1 kit 0  . busPIRone (BUSPAR) 15 MG tablet Take 15 mg by mouth 2 (two) times daily.     Marland Kitchen doxycycline (VIBRA-TABS) 100 MG tablet Take 1 tablet (100 mg total) by mouth 2 (two) times daily. 20 tablet 0  . empagliflozin (JARDIANCE) 10 MG TABS tablet Take 10 mg by mouth daily before breakfast. 30 tablet 11  . FLUoxetine (PROZAC) 20 MG capsule TAKE 1 CAPSULE (20 MG TOTAL) BY MOUTH AT BEDTIME. 90 capsule 2  . gabapentin (NEURONTIN) 400 MG capsule Take 1 capsule (400 mg total) by mouth at bedtime.    Marland Kitchen glucose blood (ONETOUCH VERIO) test strip For E11.22.  Use to check glucose qid 100 strip 12  . hydrALAZINE (APRESOLINE) 50 MG tablet Take 1 tablet (50 mg total) by mouth every 8 (eight) hours. 270 tablet 3  . insulin aspart (NOVOLOG FLEXPEN) 100 UNIT/ML FlexPen Inject 5 Units into the skin 3 (three) times daily with meals. Dx: E11.9 15 mL   . isosorbide mononitrate (IMDUR) 60 MG 24 hr tablet TAKE 1 TABLET (60 MG TOTAL) BY MOUTH DAILY FOR 30 DAYS. ** DO NOT CRUSH ** 90 tablet 0  . nitroGLYCERIN (NITROSTAT) 0.4 MG SL tablet Place 1 tablet (0.4 mg total) under the  tongue every 5 (five) minutes as needed. 25 tablet 3  . omeprazole (PRILOSEC) 40 MG capsule TAKE 1 CAPSULE BY MOUTH 30- 60 MIN BEFORE YOUR FIRST AND LAST MEALS OF THE DAY 180 capsule 0  . rosuvastatin (CRESTOR) 20 MG tablet Take 1 tablet (20 mg total) by mouth daily. 90 tablet 2  . sacubitril-valsartan (ENTRESTO) 24-26 MG Take 1 tablet by mouth 2 (two) times daily. 60 tablet 6  . topiramate (TOPAMAX) 25 MG tablet TAKE 1 TABLET BY MOUTH TWICE A DAY 180 tablet 1  . torsemide (DEMADEX) 20 MG tablet TAKE 1 TABLET (20 MG TOTAL) BY MOUTH EVERY MONDAY, WEDNESDAY, AND FRIDAY. 15 tablet 0  . Verapamil HCl CR 300 MG CP24      No current facility-administered medications for this encounter.    Vitals:   04/03/20 1339  BP: (!) 144/88  Pulse: 78  SpO2: 94%  Weight: 105.2 kg (232 lb)   Wt Readings from Last 3 Encounters:  04/03/20 105.2 kg (232 lb)  04/03/20 106 kg (233 lb 9.6 oz)  01/04/20 103.7 kg (228 lb 9.6 oz)    PHYSICAL EXAM: General:  Well appearing, obese WF. No respiratory difficulty HEENT: normal Neck: supple. no JVD. Carotids 2+ bilat; no bruits. No lymphadenopathy or thyromegaly appreciated. Cor: PMI nondisplaced. Regular rate & rhythm. No rubs, gallops or murmurs. Lungs: clear Abdomen: soft, nontender, nondistended. No hepatosplenomegaly. No bruits or masses. Good bowel sounds. Extremities: no cyanosis, clubbing, rash, edema Neuro: alert & oriented x 3, cranial nerves grossly intact. moves all 4 extremities w/o difficulty. Affect pleasant.    ASSESSMENT & PLAN:  1. Chronic biventricular HF due to NICM - Echo 9/20 EF 25-30% (new). RV severely HK  - Cath 8/20 with minimal CAD - Echo 1/21  EF 45-50% with mod RV dysfunction. Grade 2 DD. MV is thickened with mild MR/MS - NYHA III confounded by obesity and  COPD - Volume elevated today. Wt up 5 lb. ReDs Clip elevated at 40%. Admits to eating more salt (fast food). Also has been holding her lasix on the weekends.  - Increase  Entresto to 97-103 bid - Continue torsemide 20 daily. Can take extra as needed. Strict compliance stressed.  - Continue hydralazine 50 mg three times a day + 30 mg imdur.  - Has not been on b-blocker with wheezing - Continue Jardiance - Check BMP today  - Advised low sodium diet   2. Chronic hypoxic respiratory failure - stable. Follows with Dr. Valeta Harms in [place]  3. PAH, mild to moderate - likely who GROUP II & III - not candidate for selective pulmonary vasodilators - continue management of systolic HF per above and CPAP therapy for OSA  4. CKD Stage III - baseline creatinine 1.4-1.7 - Check BMP today and again in 10 days w/ Entresto increase  5. COPD - Quit smoking since 08/16/19.  - Congratulated her  6. Morbid obesity Body mass index is 41.1 kg/m. - encouraged increasing physical activity for wt loss  7. OSA - followed by Dr. Radford Pax - admits to poor compliance w/ CPAP - strongly encouraged that she starts to use this nightly   8. DM2 - on SGLT2i  9. Palpitations=> SVT - Zio patch 1/21showed frequent SVT - Referred to EP. Dr. Lovena Le now following  - Plans to have SVT ablation, hopefully in June  - Encouraged to improve compliance w/ CPAP   Plan return lab visit in 10 days after diuretic increase. F/u w/ Dr. Haroldine Laws in 3 months or sooner if needed   Myrical Andujo PA-C 1:52 PM

## 2020-04-03 NOTE — Progress Notes (Signed)
HPI [name] is referred by Dr. Reine Just for consideration for eval of SVT. She is a pleasant mobidly obese 64 yo woman with a non-ischemic CM who experienced palpitations and a Zio patch demonstrated recurrent episodes of sustained SVT, which appears to be a longish short RP tachycardia. She thinks that her symptoms have been present for several months but she denies a long h/o palpitations or SVT. When she wore the cardiac monitor she also had VT, NS. She has not had syncope Her EF is 45% by echo, previously worse. Allergies  Allergen Reactions  . Atorvastatin Other (See Comments) and Nausea And Vomiting    "legs cramped; moderate to almost severe" "legs cramped; moderate to almost severe"  . Ibuprofen Other (See Comments)    Other reaction(s): Other (See Comments) Per pt, MD doesn't want pt to take Per pt, MD doesn't want pt to take  . Metformin And Related Nausea And Vomiting  . Ace Inhibitors Cough and Nausea And Vomiting  . Hydrocodone-Acetaminophen Nausea And Vomiting  . Albuterol Other (See Comments)    Caused SVT     Current Outpatient Medications  Medication Sig Dispense Refill  . acetaminophen (TYLENOL) 325 MG tablet Take 2 tablets (650 mg total) by mouth every 6 (six) hours as needed for mild pain. 90 tablet 2  . albuterol (VENTOLIN HFA) 108 (90 Base) MCG/ACT inhaler TAKE 2 PUFFS BY MOUTH EVERY 6 HOURS AS NEEDED FOR WHEEZE OR SHORTNESS OF BREATH 18 g 1  . allopurinol (ZYLOPRIM) 100 MG tablet Take 100 mg by mouth daily.     Marland Kitchen aspirin EC 81 MG tablet Take 1 tablet (81 mg total) by mouth daily. 90 tablet 3  . benzonatate (TESSALON) 100 MG capsule Take 1 capsule (100 mg total) by mouth every 8 (eight) hours. 21 capsule 0  . Blood Glucose Monitoring Suppl (ONETOUCH VERIO) w/Device KIT USE TO CHECK BLOOD GLUCOSE 4 TIMES A DAY . DIAGNOSIS: TYPE 2 DIABETES E11.59 1 kit 0  . busPIRone (BUSPAR) 15 MG tablet Take 15 mg by mouth 2 (two) times daily.     . CHANTIX STARTING MONTH  PAK 0.5 MG X 11 & 1 MG X 42 tablet TAKE AS DIRECTED ON PACKAGE 53 each 0  . doxycycline (VIBRA-TABS) 100 MG tablet Take 1 tablet (100 mg total) by mouth 2 (two) times daily. 20 tablet 0  . empagliflozin (JARDIANCE) 10 MG TABS tablet Take 10 mg by mouth daily before breakfast. 30 tablet 11  . FLUoxetine (PROZAC) 20 MG capsule TAKE 1 CAPSULE (20 MG TOTAL) BY MOUTH AT BEDTIME. 90 capsule 2  . gabapentin (NEURONTIN) 400 MG capsule Take 1 capsule (400 mg total) by mouth at bedtime.    Marland Kitchen glucose blood (ONETOUCH VERIO) test strip For E11.22.  Use to check glucose qid 100 strip 12  . hydrALAZINE (APRESOLINE) 50 MG tablet Take 1 tablet (50 mg total) by mouth every 8 (eight) hours. 270 tablet 3  . insulin aspart (NOVOLOG FLEXPEN) 100 UNIT/ML FlexPen Inject 5 Units into the skin 3 (three) times daily with meals. Dx: E11.9 15 mL   . Insulin Glargine 300 UNIT/ML SOPN Inject 60 Units into the skin daily.    . isosorbide mononitrate (IMDUR) 60 MG 24 hr tablet TAKE 1 TABLET (60 MG TOTAL) BY MOUTH DAILY FOR 30 DAYS. ** DO NOT CRUSH ** 90 tablet 0  . ketorolac (ACULAR) 0.5 % ophthalmic solution     . nitroGLYCERIN (NITROSTAT) 0.4 MG SL tablet Place  1 tablet (0.4 mg total) under the tongue every 5 (five) minutes as needed. 25 tablet 3  . ofloxacin (OCUFLOX) 0.3 % ophthalmic solution     . omeprazole (PRILOSEC) 40 MG capsule TAKE 1 CAPSULE BY MOUTH 30- 60 MIN BEFORE YOUR FIRST AND LAST MEALS OF THE DAY 180 capsule 0  . potassium chloride SA (KLOR-CON M20) 20 MEQ tablet TAKE 1 TABLET DAILY ONLY WHEN DIRECTED BY THE HEART FAILURE CLINIC 90 tablet 1  . prednisoLONE acetate (PRED FORTE) 1 % ophthalmic suspension     . sacubitril-valsartan (ENTRESTO) 24-26 MG Take 1 tablet by mouth 2 (two) times daily. 60 tablet 6  . topiramate (TOPAMAX) 25 MG tablet TAKE 1 TABLET BY MOUTH TWICE A DAY 180 tablet 1  . torsemide (DEMADEX) 20 MG tablet TAKE 1 TABLET (20 MG TOTAL) BY MOUTH EVERY MONDAY, WEDNESDAY, AND FRIDAY. 15 tablet 0  .  Verapamil HCl CR 300 MG CP24     . rosuvastatin (CRESTOR) 20 MG tablet Take 1 tablet (20 mg total) by mouth daily. 90 tablet 2   No current facility-administered medications for this visit.     Past Medical History:  Diagnosis Date  . Anxiety    doesn't take any meds for this  . Arteriosclerosis of coronary artery 08/31/2011   Myoview 9/12: EF 44, no ischemia // Myoview 8/18: EF 57, low risk, possible small area of inferior ischemia // Minor nonobstructive CAD by cardiac catheterization - LHC 8/18: Proximal LAD 35, OM2 25  . Arthritis    knuckles  . Asthma   . Carotid artery disease ([place])    Carotid US 7/14: Bilateral ICA 40-59 // Carotid US ([facility]) 2/17: bilat plaque without sig stenosis  . Chronic diastolic CHF (congestive heart failure) ([place]) 07/26/2017   Echo 7/18:Severe conc LVH, EF 60-65, no RWMA, Gr 2 DD, MAC, mild MR // right and left heart cath 8/18: LVEDP 21  . Claustrophobia 07/26/2012  . COPD (chronic obstructive pulmonary disease) ([place])    "chronic bronchitis - about every winter"  . CSF leak    dizziness/headache - assoc symptoms // 2/2 encephalocele s/p skull base reconstruction  . Diverticulosis   . History of CVA (cerebrovascular accident) 03/2004   denies residual 07/26/2012  . Hx of gout   . Hyperlipidemia    takes Pravastatin daily  . Hypertensive heart disease with chronic diastolic congestive heart failure ([place]) 07/26/2017  . Internal hemorrhoids   . Kidney stones   . LVH (left ventricular hypertrophy)    Echo 8/18: Severe conc LVH, EF 60-65, no RWMA, Gr 2 DD, MAC, mild MR  . OSA (obstructive sleep apnea) 07/26/2012   prior CPAP - stopped due to being primary caregiver for dying parent (stopped in 2016)  . PSVT (paroxysmal supraventricular tachycardia) ([place])   . Pulmonary hypertension, unspecified ([place]) 08/14/2017   cath 07/2017 >>PASP 58m Hg, PA mean 39 mm Hg  . Type II diabetes mellitus ([facility])     ROS:   All systems reviewed and negative except as  noted in the HPI.   Past Surgical History:  Procedure Laterality Date  . ESOPHAGOGASTRODUODENOSCOPY (EGD) WITH PROPOFOL N/A 07/13/2015   Procedure: ESOPHAGOGASTRODUODENOSCOPY (EGD) WITH PROPOFOL;  Surgeon: PCarol Ada MD;  Location: [facility];  Service: Endoscopy;  Laterality: N/A;  . NASAL SINUS SURGERY  01/29/2012   Procedure: ENDOSCOPIC SINUS SURGERY WITH STEALTH;  Surgeon: MRuby Cola MD;  [facility]  Service: ENT;  Laterality: N/A;  . NM MYOVIEW LTD  08/25/2011  Low risk study, no evidence of ischemia, EF 44%  . REFRACTIVE SURGERY  ~ 2010   left  . RIGHT/LEFT HEART CATH AND CORONARY ANGIOGRAPHY N/A 07/30/2017   Procedure: RIGHT/LEFT HEART CATH AND CORONARY ANGIOGRAPHY;  Surgeon: Martinique, Peter M, MD;  Location: [facility];  Service: Cardiovascular;  Laterality: N/A;  . SPHENOIDECTOMY  01/29/2012   Procedure: SPHENOIDECTOMY;  Surgeon: Ruby Cola, MD;  Location: [facility];  Service: ENT;  Laterality: Left;  REPAIR OF LEFT SPHENOID    . TOTAL ABDOMINAL HYSTERECTOMY  1994     Family History  Problem Relation Age of Onset  . Colon cancer Sister 11  . Breast cancer Mother 58  . Alzheimer's disease Mother   . Diabetes Sister 21  . Heart failure Sister   . Diabetes Sister 64  . Lung cancer Father   . Breast cancer Sister   . Cancer Sister        mouth  . Anesthesia problems Neg Hx   . Hypotension Neg Hx   . Malignant hyperthermia Neg Hx   . Pseudochol deficiency Neg Hx      Social History   Socioeconomic History  . Marital status: Single    Spouse name: Not on file  . Number of children: 0  . Years of education: Not on file  . Highest education level: Not on file  Occupational History  . Occupation: disabled  Tobacco Use  . Smoking status: Former Smoker    Packs/day: 0.50    Years: 35.00    Pack years: 17.50    Types: Cigarettes    Quit date: 08/16/2019    Years since quitting: 0.6  . Smokeless tobacco: Never Used  Substance and Sexual Activity   . Alcohol use: Yes    Alcohol/week: 1.0 standard drinks    Types: 1 Glasses of wine per week    Comment: rarely  . Drug use: No  . Sexual activity: Never    Birth control/protection: Surgical  Other Topics Concern  . Not on file  Social History Narrative   Currently unemployed.   No children, not married   Social Determinants of Radio broadcast assistant Strain:   . Difficulty of Paying Living Expenses:   Food Insecurity:   . Worried About Charity fundraiser in the Last Year:   . Arboriculturist in the Last Year:   Transportation Needs:   . Film/video editor (Medical):   Marland Kitchen Lack of Transportation (Non-Medical):   Physical Activity:   . Days of Exercise per Week:   . Minutes of Exercise per Session:   Stress:   . Feeling of Stress :   Social Connections:   . Frequency of Communication with Friends and Family:   . Frequency of Social Gatherings with Friends and Family:   . Attends Religious Services:   . Active Member of Clubs or Organizations:   . Attends Archivist Meetings:   Marland Kitchen Marital Status:   Intimate Partner Violence:   . Fear of Current or Ex-Partner:   . Emotionally Abused:   Marland Kitchen Physically Abused:   . Sexually Abused:      BP 138/68   Pulse 77   Ht _0  (1.6 m)   Wt 233 lb 9.6 oz (106 kg)   SpO2 93%   BMI 41.38 kg/m   Physical Exam:  Well appearing NAD HEENT: Unremarkable Neck:  No JVD, no thyromegally Lymphatics:  No adenopathy Back:  No CVA  tenderness Lungs:  Clear with no wheezes HEART:  Regular rate rhythm, no murmurs, no rubs, no clicks Abd:  soft, positive bowel sounds, no organomegally, no rebound, no guarding Ext:  2 plus pulses, no edema, no cyanosis, no clubbing Skin:  No rashes no nodules Neuro:  CN II through XII intact, motor grossly intact  EKG - NSR with poor R wave progression.  DEVICE  Normal device function.  See PaceArt for details.   Assess/Plan: 1. SVT - I have discussed the treatment options with the  patient and the risks/benefits/goals/expectations of EPS/catheter ablation were reviewed and she wishes to proceed. 2. Obesity - at some point, she will need to work on losing weight. 3. HTN - her SBP is up a bit. She will continue her current meds. She needs to reduce salt and calories.  Mikle Bosworth.D.

## 2020-04-04 ENCOUNTER — Other Ambulatory Visit (HOSPITAL_COMMUNITY): Payer: Self-pay | Admitting: *Deleted

## 2020-04-04 ENCOUNTER — Telehealth (HOSPITAL_COMMUNITY): Payer: Self-pay | Admitting: *Deleted

## 2020-04-04 ENCOUNTER — Other Ambulatory Visit: Payer: Self-pay | Admitting: Family Medicine

## 2020-04-04 DIAGNOSIS — N184 Chronic kidney disease, stage 4 (severe): Secondary | ICD-10-CM | POA: Diagnosis not present

## 2020-04-04 DIAGNOSIS — I471 Supraventricular tachycardia: Secondary | ICD-10-CM

## 2020-04-04 DIAGNOSIS — I5082 Biventricular heart failure: Secondary | ICD-10-CM | POA: Diagnosis not present

## 2020-04-04 DIAGNOSIS — G4733 Obstructive sleep apnea (adult) (pediatric): Secondary | ICD-10-CM | POA: Diagnosis not present

## 2020-04-04 DIAGNOSIS — E1122 Type 2 diabetes mellitus with diabetic chronic kidney disease: Secondary | ICD-10-CM | POA: Diagnosis not present

## 2020-04-04 DIAGNOSIS — N390 Urinary tract infection, site not specified: Secondary | ICD-10-CM | POA: Diagnosis not present

## 2020-04-04 DIAGNOSIS — I129 Hypertensive chronic kidney disease with stage 1 through stage 4 chronic kidney disease, or unspecified chronic kidney disease: Secondary | ICD-10-CM | POA: Diagnosis not present

## 2020-04-04 MED ORDER — TORSEMIDE 20 MG PO TABS: 20.0000 mg | ORAL_TABLET | Freq: Every day | ORAL | 0 refills | Status: DC | PRN

## 2020-04-04 MED ORDER — ENTRESTO 49-51 MG PO TABS: 1.0000 | ORAL_TABLET | Freq: Two times a day (BID) | ORAL | 3 refills | Status: DC

## 2020-04-04 NOTE — Telephone Encounter (Signed)
[place] please advise, last ov with Dr. Zigmund Daniel for BP was 12/14/2019--pt schedule TOC with you on 04/11/2020.

## 2020-04-04 NOTE — Telephone Encounter (Signed)
-----   Message from Consuelo Pandy, [place] sent at 04/03/2020  6:25 PM EDT ----- SCr is elevated. Continue Entresto but change torsemide to PRN use only. Monitor wt closely this week. If significant wt gain off of daily torsemide, call us for notification. Stop daily KCl. Only take when taking torsemide. Repeat BMP in 1 week.

## 2020-04-04 NOTE — Telephone Encounter (Signed)
[name], [place]  04/04/2020 10:21 AM EDT    Pt aware and agreeable with plan. Pt already has lab appt scheduled 5/7.    Consuelo Pandy, PA-C  04/03/2020 6:25 PM EDT    SCr is elevated. Continue Entresto but change torsemide to PRN use only. Monitor wt closely this week. If significant wt gain off of daily torsemide, call us for notification. Stop daily KCl. Only take when taking torsemide. Repeat BMP in 1 week.

## 2020-04-04 NOTE — Telephone Encounter (Signed)
Pt was told to call our office and confirm her dose of entresto before we increase it . Pt takes 24/26mg  twice daily new script sent for entresto 49/51mg  twice daily. Pt aware of medication change.

## 2020-04-09 ENCOUNTER — Telehealth: Payer: Self-pay | Admitting: Internal Medicine

## 2020-04-09 DIAGNOSIS — I471 Supraventricular tachycardia: Secondary | ICD-10-CM

## 2020-04-09 NOTE — Telephone Encounter (Signed)
Attempted to return call to Pt.  Went directly to VM  Left message  Also sent message via MyChart

## 2020-04-09 NOTE — Telephone Encounter (Signed)
Patient states that she would like her cardiac ablation to be schedule for June 14th.

## 2020-04-10 ENCOUNTER — Other Ambulatory Visit: Payer: Self-pay

## 2020-04-11 ENCOUNTER — Encounter: Payer: Self-pay | Admitting: Nurse Practitioner

## 2020-04-11 ENCOUNTER — Ambulatory Visit (INDEPENDENT_AMBULATORY_CARE_PROVIDER_SITE_OTHER): Payer: Medicare Other | Admitting: Nurse Practitioner

## 2020-04-11 VITALS — BP 140/84 | HR 93 | Temp 96.4°F | Ht 63.0 in | Wt 236.6 lb

## 2020-04-11 DIAGNOSIS — J449 Chronic obstructive pulmonary disease, unspecified: Secondary | ICD-10-CM | POA: Diagnosis not present

## 2020-04-11 DIAGNOSIS — I10 Essential (primary) hypertension: Secondary | ICD-10-CM | POA: Diagnosis not present

## 2020-04-11 DIAGNOSIS — E782 Mixed hyperlipidemia: Secondary | ICD-10-CM

## 2020-04-11 DIAGNOSIS — E1121 Type 2 diabetes mellitus with diabetic nephropathy: Secondary | ICD-10-CM

## 2020-04-11 DIAGNOSIS — Z794 Long term (current) use of insulin: Secondary | ICD-10-CM | POA: Diagnosis not present

## 2020-04-11 DIAGNOSIS — N1832 Chronic kidney disease, stage 3b: Secondary | ICD-10-CM

## 2020-04-11 DIAGNOSIS — E1122 Type 2 diabetes mellitus with diabetic chronic kidney disease: Secondary | ICD-10-CM

## 2020-04-11 DIAGNOSIS — F331 Major depressive disorder, recurrent, moderate: Secondary | ICD-10-CM | POA: Diagnosis not present

## 2020-04-11 LAB — HEPATIC FUNCTION PANEL
ALT: 8 U/L (ref 0–35)
AST: 13 U/L (ref 0–37)
Albumin: 4.2 g/dL (ref 3.5–5.2)
Alkaline Phosphatase: 91 U/L (ref 39–117)
Bilirubin, Direct: 0.1 mg/dL (ref 0.0–0.3)
Total Bilirubin: 0.6 mg/dL (ref 0.2–1.2)
Total Protein: 6.8 g/dL (ref 6.0–8.3)

## 2020-04-11 LAB — CBC WITH DIFFERENTIAL/PLATELET
Basophils Absolute: 0.1 10*3/uL (ref 0.0–0.1)
Basophils Relative: 0.7 % (ref 0.0–3.0)
Eosinophils Absolute: 0.2 10*3/uL (ref 0.0–0.7)
Eosinophils Relative: 3 % (ref 0.0–5.0)
HCT: 43.7 % (ref 36.0–46.0)
Hemoglobin: 14.2 g/dL (ref 12.0–15.0)
Lymphocytes Relative: 22 % (ref 12.0–46.0)
Lymphs Abs: 1.6 10*3/uL (ref 0.7–4.0)
MCHC: 32.6 g/dL (ref 30.0–36.0)
MCV: 90.4 fl (ref 78.0–100.0)
Monocytes Absolute: 0.4 10*3/uL (ref 0.1–1.0)
Monocytes Relative: 5.6 % (ref 3.0–12.0)
Neutro Abs: 5 10*3/uL (ref 1.4–7.7)
Neutrophils Relative %: 68.7 % (ref 43.0–77.0)
Platelets: 206 10*3/uL (ref 150.0–400.0)
RBC: 4.83 Mil/uL (ref 3.87–5.11)
RDW: 16.1 % — ABNORMAL HIGH (ref 11.5–15.5)
WBC: 7.3 10*3/uL (ref 4.0–10.5)

## 2020-04-11 LAB — LIPID PANEL
Cholesterol: 220 mg/dL — ABNORMAL HIGH (ref 0–200)
HDL: 55.4 mg/dL (ref >39.00)
NonHDL: 164.56
Total CHOL/HDL Ratio: 4
Triglycerides: 209 mg/dL — ABNORMAL HIGH (ref 0.0–149.0)
VLDL: 41.8 mg/dL — ABNORMAL HIGH (ref 0.0–40.0)

## 2020-04-11 LAB — MICROALBUMIN / CREATININE URINE RATIO
Creatinine,U: 124.8 mg/dL
Microalb Creat Ratio: 232.7 mg/g — ABNORMAL HIGH (ref 0.0–30.0)
Microalb, Ur: 290.5 mg/dL — ABNORMAL HIGH (ref 0.0–1.9)

## 2020-04-11 LAB — LDL CHOLESTEROL, DIRECT: Direct LDL: 121 mg/dL

## 2020-04-11 LAB — HEMOGLOBIN A1C: Hgb A1c MFr Bld: 6.2 % (ref 4.6–6.5)

## 2020-04-11 NOTE — Assessment & Plan Note (Addendum)
Controlled with jardiance and novolog Last HbA1c of 6. No hypoglycemia Hx of CKD (followed by [facility] and neuropathy (use of gabapentin) Up to date with eye exam Also under the care of podiatry: Dr. Jacqualyn Posey.  Continue jardiance Calculated GFr at 26ml/min Hold novolog F/up in 83months.

## 2020-04-11 NOTE — Telephone Encounter (Signed)
Follow up   Patient would like a call back in reference to the prior message.

## 2020-04-11 NOTE — Progress Notes (Signed)
Subjective:  Patient ID: [name], female    DOB: July 14, 1956  Age: 64 y.o. MRN: [id number]  CC: Establish Care (TOC from Dr. Toy Cookey month f/u-A1C-pt is fasting-Blood sugars staying aroun 98 to 131//no Covid..Eye Feb cataracts sugery-[facility]//Due for colonscopy//)  HPI HTN: BP at goal with entresto, hydralazine, imdur and torsemide. Hx of CHF and CKD Under the care of Dr. Hollie Salk (neprology with [facility]) and Dr. Lovena Le [facility] cardiology).  DM: Controlled with jardiance and novolog Last HbA1c of 6. No hypoglycemia Hx of CKD and neuropathy. Up to date with eye exam Also under the care of podiatry: Dr. Jacqualyn Posey. LDL not at goal with crestor.   Depression: Stable with buspar and prozac Depression screen [facility] 2/9 04/11/2020 11/09/2019 11/27/2013  Decreased Interest 0 0 3  Down, Depressed, Hopeless 0 0 2  PHQ - 2 Score 0 0 5  Altered sleeping 0 - 3  Tired, decreased energy 1 - 3  Change in appetite 3 - 3  Feeling bad or failure about yourself  1 - 3  Trouble concentrating 0 - 1  Moving slowly or fidgety/restless 1 - 2  Suicidal thoughts 0 - 0  PHQ-9 Score 6 - 20  Some recent data might be hidden   GAD 7 : Generalized Anxiety Score 04/11/2020  Nervous, Anxious, on Edge 0  Control/stop worrying 0  Worry too much - different things 0  Trouble relaxing 0  Restless 0  Easily annoyed or irritable 0  Afraid - awful might happen 0  Total GAD 7 Score 0    Reviewed past Medical, Social and Family history today.  Outpatient Medications Prior to Visit  Medication Sig Dispense Refill  . acetaminophen (TYLENOL) 325 MG tablet Take 2 tablets (650 mg total) by mouth every 6 (six) hours as needed for mild pain. 90 tablet 2  . albuterol (VENTOLIN HFA) 108 (90 Base) MCG/ACT inhaler TAKE 2 PUFFS BY MOUTH EVERY 6 HOURS AS NEEDED FOR WHEEZE OR SHORTNESS OF BREATH 18 g 1  . allopurinol (ZYLOPRIM) 100 MG tablet Take 100 mg by mouth daily.     Marland Kitchen  aspirin EC 81 MG tablet Take 1 tablet (81 mg total) by mouth daily. 90 tablet 3  . benzonatate (TESSALON) 100 MG capsule Take 1 capsule (100 mg total) by mouth every 8 (eight) hours. 21 capsule 0  . Blood Glucose Monitoring Suppl (ONETOUCH VERIO) w/Device KIT USE TO CHECK BLOOD GLUCOSE 4 TIMES A DAY . DIAGNOSIS: TYPE 2 DIABETES E11.59 1 kit 0  . busPIRone (BUSPAR) 15 MG tablet Take 15 mg by mouth 2 (two) times daily.     Marland Kitchen doxycycline (VIBRA-TABS) 100 MG tablet Take 1 tablet (100 mg total) by mouth 2 (two) times daily. 20 tablet 0  . empagliflozin (JARDIANCE) 10 MG TABS tablet Take 10 mg by mouth daily before breakfast. 30 tablet 11  . gabapentin (NEURONTIN) 400 MG capsule Take 1 capsule (400 mg total) by mouth at bedtime.    Marland Kitchen glucose blood (ONETOUCH VERIO) test strip For E11.22.  Use to check glucose qid 100 strip 12  . hydrALAZINE (APRESOLINE) 50 MG tablet Take 1 tablet (50 mg total) by mouth every 8 (eight) hours. 270 tablet 3  . insulin aspart (NOVOLOG FLEXPEN) 100 UNIT/ML FlexPen Inject 5 Units into the skin 3 (three) times daily with meals. Dx: E11.9 15 mL   . isosorbide mononitrate (IMDUR) 60 MG 24 hr tablet TAKE 1 TABLET (60 MG TOTAL) BY MOUTH  DAILY FOR 30 DAYS. ** DO NOT CRUSH ** 90 tablet 0  . nitroGLYCERIN (NITROSTAT) 0.4 MG SL tablet Place 1 tablet (0.4 mg total) under the tongue every 5 (five) minutes as needed. 25 tablet 3  . omeprazole (PRILOSEC) 40 MG capsule TAKE 1 CAPSULE BY MOUTH 30- 60 MIN BEFORE YOUR FIRST AND LAST MEALS OF THE DAY 180 capsule 0  . sacubitril-valsartan (ENTRESTO) 49-51 MG Take 1 tablet by mouth 2 (two) times daily. 60 tablet 3  . topiramate (TOPAMAX) 25 MG tablet TAKE 1 TABLET BY MOUTH TWICE A DAY 180 tablet 1  . torsemide (DEMADEX) 20 MG tablet Take 1 tablet (20 mg total) by mouth daily as needed. 15 tablet 0  . Verapamil HCl CR 300 MG CP24 TAKE 1 CAPSULE (300 MG TOTAL) BY MOUTH DAILY. PLEASE CALL TO SCHEDULE OVERDUE APPT WITH DR ROSS 90 capsule 1  .  FLUoxetine (PROZAC) 20 MG capsule TAKE 1 CAPSULE (20 MG TOTAL) BY MOUTH AT BEDTIME. 90 capsule 2  . rosuvastatin (CRESTOR) 20 MG tablet Take 1 tablet (20 mg total) by mouth daily. 90 tablet 2   No facility-administered medications prior to visit.    ROS See HPI  Objective:  BP 140/84   Pulse 93   Temp (!) 96.4 F (35.8 C) (Tympanic)   Ht 5' 3"  (1.6 m)   Wt 236 lb 9.6 oz (107.3 kg)   SpO2 98%   BMI 41.91 kg/m   BP Readings from Last 3 Encounters:  04/11/20 140/84  04/03/20 (!) 144/88  04/03/20 138/68    Wt Readings from Last 3 Encounters:  04/11/20 236 lb 9.6 oz (107.3 kg)  04/03/20 232 lb (105.2 kg)  04/03/20 233 lb 9.6 oz (106 kg)    Physical Exam Constitutional:      Appearance: She is obese.  Cardiovascular:     Rate and Rhythm: Normal rate and regular rhythm.     Pulses: Normal pulses.     Heart sounds: Normal heart sounds.  Pulmonary:     Effort: Pulmonary effort is normal.     Breath sounds: Normal breath sounds.  Abdominal:     Palpations: Abdomen is soft.  Musculoskeletal:     Right lower leg: Edema present.     Left lower leg: Edema present.  Neurological:     Mental Status: She is alert and oriented to person, place, and time.  Psychiatric:        Mood and Affect: Mood normal.        Behavior: Behavior normal.        Thought Content: Thought content normal.     Lab Results  Component Value Date   WBC 7.3 04/11/2020   HGB 14.2 04/11/2020   HCT 43.7 04/11/2020   PLT 206.0 04/11/2020   GLUCOSE 127 (H) 04/03/2020   CHOL 220 (H) 04/11/2020   TRIG 209.0 (H) 04/11/2020   HDL 55.40 04/11/2020   LDLDIRECT 121.0 04/11/2020   LDLCALC 68 05/23/2019   ALT 8 04/11/2020   AST 13 04/11/2020   NA 143 04/03/2020   K 4.6 04/03/2020   CL 105 04/03/2020   CREATININE 2.16 (H) 04/03/2020   BUN 34 (H) 04/03/2020   CO2 28 04/03/2020   TSH 4.742 (H) 08/17/2019   INR 1.0 07/27/2017   HGBA1C 6.2 04/11/2020   MICROALBUR 290.5 (H) 04/11/2020    Assessment  & Plan:  This visit occurred during the SARS-CoV-2 public health emergency.  Safety protocols were in place, including screening questions prior  to the visit, additional usage of staff PPE, and extensive cleaning of exam room while observing appropriate contact time as indicated for disinfecting solutions.   Mariella was seen today for establish care.  Diagnoses and all orders for this visit:  Essential hypertension -     CBC w/Diff  Type 2 diabetes mellitus with stage 3b chronic kidney disease, with long-term current use of insulin (HCC) -     Hemoglobin A1c -     Microalbumin / creatinine urine ratio -     CBC w/Diff  Mixed hyperlipidemia -     Lipid panel -     Hepatic function panel -     LDL cholesterol, direct -     rosuvastatin (CRESTOR) 20 MG tablet; Take 1 tablet (20 mg total) by mouth daily. -     fenofibrate 54 MG tablet; Take 1 tablet (54 mg total) by mouth daily.  Chronic obstructive pulmonary disease, unspecified COPD type (HCC)  Moderate episode of recurrent major depressive disorder (HCC) -     FLUoxetine (PROZAC) 20 MG capsule; Take 1 capsule (20 mg total) by mouth at bedtime.   I am having Othell Diluzio. Buonocore "Diane" start on fenofibrate. I am also having her maintain her busPIRone, aspirin EC, nitroGLYCERIN, allopurinol, acetaminophen, gabapentin, hydrALAZINE, omeprazole, empagliflozin, OneTouch Verio, OneTouch Verio, benzonatate, albuterol, topiramate, NovoLOG FlexPen, doxycycline, isosorbide mononitrate, Verapamil HCl CR, Entresto, torsemide, FLUoxetine, and rosuvastatin.  Meds ordered this encounter  Medications  . FLUoxetine (PROZAC) 20 MG capsule    Sig: Take 1 capsule (20 mg total) by mouth at bedtime.    Dispense:  90 capsule    Refill:  3    Order Specific Question:   Supervising Provider    Answer:   Ronnald Nian [[id number]]  . rosuvastatin (CRESTOR) 20 MG tablet    Sig: Take 1 tablet (20 mg total) by mouth daily.    Dispense:  90 tablet     Refill:  2    Order Specific Question:   Supervising Provider    Answer:   Ronnald Nian [[id number]]  . fenofibrate 54 MG tablet    Sig: Take 1 tablet (54 mg total) by mouth daily.    Dispense:  90 tablet    Refill:  3    Order Specific Question:   Supervising Provider    Answer:   Ronnald Nian [[id number]]    Problem List Items Addressed This Visit      Cardiovascular and Mediastinum   Essential hypertension - Primary    BP at goal with entresto, hydralazine, imdur and torsemide. Hx of CHF and CKD Under the care of Dr. Hollie Salk ([facility]eprology with [facility]) and Dr. Lovena Le [facility] cardiology). BP Readings from Last 3 Encounters:  04/11/20 140/84  04/03/20 (!) 144/88  04/03/20 138/68        Relevant Medications   rosuvastatin (CRESTOR) 20 MG tablet   fenofibrate 54 MG tablet   Other Relevant Orders   CBC w/Diff (Completed)     Respiratory   COPD  GOLD 0 still smoking    Quit tobacco use 08/2019 Current use of SABA prn. No exacerbation since 11/2019.        Endocrine   Type 2 diabetes mellitus with stage 3 chronic kidney disease, with long-term current use of insulin (HCC)    Controlled with jardiance and novolog Last HbA1c of 6. No hypoglycemia Hx of CKD (followed by [place] [facility] and neuropathy (use of gabapentin) Up to date  with eye exam Also under the care of podiatry: Dr. Jacqualyn Posey.  Continue jardiance Calculated GFr at 14m/min Hold novolog F/up in 673month       Relevant Medications   rosuvastatin (CRESTOR) 20 MG tablet   Other Relevant Orders   Hemoglobin A1c (Completed)   Microalbumin / creatinine urine ratio (Completed)   CBC w/Diff (Completed)     Other   HLD (hyperlipidemia)    LDL not at goal with crestor Add fenofibrate Lipid Panel     Component Value Date/Time   CHOL 220 (H) 04/11/2020 0854   TRIG 209.0 (H) 04/11/2020 0854   HDL 55.40 04/11/2020 0854   CHOLHDL 4 04/11/2020 0854   VLDL 41.8 (H) 04/11/2020 0854    LDLCALC 68 05/23/2019 1201   LDLDIRECT 121.0 04/11/2020 0854        Relevant Medications   rosuvastatin (CRESTOR) 20 MG tablet   fenofibrate 54 MG tablet   Other Relevant Orders   Lipid panel (Completed)   Hepatic function panel (Completed)   LDL cholesterol, direct (Completed)   Moderate episode of recurrent major depressive disorder (HCC)    Stable mood with prozac and buspar.      Relevant Medications   FLUoxetine (PROZAC) 20 MG capsule       Follow-up: Return in about 6 months (around 10/12/2020) for DM and HTN, hyperlipidemia (fasting).  ChWilfred LacyNP

## 2020-04-11 NOTE — Telephone Encounter (Signed)
Attempted to return call to Pt  Went directly to VM  Left message requesting call back this afternoon  Also advised she can respond to me via mychart if that is easier for her.  Await call back.

## 2020-04-11 NOTE — Assessment & Plan Note (Signed)
Stable mood with prozac and buspar.

## 2020-04-11 NOTE — Telephone Encounter (Signed)
Unable to reach Pt.  Have sent message via myChart requesting response.  All phone calls to Pt go directly to VM.  Have left 2 messages.

## 2020-04-11 NOTE — Assessment & Plan Note (Addendum)
Quit tobacco use 08/2019 Current use of SABA prn. No exacerbation since 11/2019.

## 2020-04-11 NOTE — Telephone Encounter (Signed)
Patient returning call. Transferred call to the nurse.

## 2020-04-11 NOTE — Patient Instructions (Addendum)
Abnormal urine microalbumin due to CKD Abnormal lipid panel, continue crestor and f/up with cardiology. Normal liver function and cbc HgbA1c at 6.2, maintain jairdiance and stop novolog

## 2020-04-11 NOTE — Assessment & Plan Note (Signed)
BP at goal with entresto, hydralazine, imdur and torsemide. Hx of CHF and CKD Under the care of Dr. Hollie Salk (neprology with [facility]) and Dr. Lovena Le [facility] cardiology). BP Readings from Last 3 Encounters:  04/11/20 140/84  04/03/20 (!) 144/88  04/03/20 138/68

## 2020-04-12 ENCOUNTER — Other Ambulatory Visit: Payer: Self-pay | Admitting: Family Medicine

## 2020-04-12 NOTE — Telephone Encounter (Signed)
CN-Plz see refill req/thx dmf 

## 2020-04-13 ENCOUNTER — Telehealth: Payer: Self-pay | Admitting: Nurse Practitioner

## 2020-04-13 ENCOUNTER — Other Ambulatory Visit (HOSPITAL_COMMUNITY): Payer: Medicare Other

## 2020-04-13 ENCOUNTER — Other Ambulatory Visit: Payer: Self-pay | Admitting: Family Medicine

## 2020-04-13 DIAGNOSIS — F17200 Nicotine dependence, unspecified, uncomplicated: Secondary | ICD-10-CM

## 2020-04-13 MED ORDER — VARENICLINE TARTRATE 0.5 MG PO TABS: 0.5000 mg | ORAL_TABLET | Freq: Two times a day (BID) | ORAL | 2 refills | Status: DC

## 2020-04-13 MED ORDER — FENOFIBRATE 54 MG PO TABS: 54.0000 mg | ORAL_TABLET | Freq: Every day | ORAL | 3 refills | Status: DC

## 2020-04-13 MED ORDER — ROSUVASTATIN CALCIUM 20 MG PO TABS: 20.0000 mg | ORAL_TABLET | Freq: Every day | ORAL | 2 refills | Status: DC

## 2020-04-13 MED ORDER — FLUOXETINE HCL 20 MG PO CAPS: 20.0000 mg | ORAL_CAPSULE | Freq: Every day | ORAL | 3 refills | Status: AC

## 2020-04-13 NOTE — Assessment & Plan Note (Addendum)
LDL not at goal with crestor Add fenofibrate Lipid Panel     Component Value Date/Time   CHOL 220 (H) 04/11/2020 0854   TRIG 209.0 (H) 04/11/2020 0854   HDL 55.40 04/11/2020 0854   CHOLHDL 4 04/11/2020 0854   VLDL 41.8 (H) 04/11/2020 0854   LDLCALC 68 05/23/2019 1201   LDLDIRECT 121.0 04/11/2020 4327

## 2020-04-13 NOTE — Telephone Encounter (Signed)
chantix sent Need to schedule f/up appt in 72months instead of 50months.

## 2020-04-13 NOTE — Telephone Encounter (Signed)
Pt notified and understood to pick up RX and was rescheduled to 07/12/2020 in 3 months instead of 6.

## 2020-04-13 NOTE — Telephone Encounter (Signed)
Patient called to request new prescription for Chantix because she is feeling the need to smoke cigarettes again.

## 2020-04-13 NOTE — Telephone Encounter (Signed)
Pt scheduled for sVT ablation on June 14  Labs/covid test June 11   Instruction letters created and mailed to Pt.

## 2020-04-16 ENCOUNTER — Ambulatory Visit (HOSPITAL_COMMUNITY)
Admission: RE | Admit: 2020-04-16 | Discharge: 2020-04-16 | Disposition: A | Discharge status: Home / Self Care | Payer: Medicare Other | Source: Ambulatory Visit | Attending: Cardiology | Admitting: Cardiology

## 2020-04-16 ENCOUNTER — Other Ambulatory Visit: Payer: Self-pay

## 2020-04-16 ENCOUNTER — Inpatient Hospital Stay (HOSPITAL_COMMUNITY)
Admission: EM | Admit: 2020-04-16 | Discharge: 2020-04-20 | DRG: 291 | Disposition: A | Discharge status: Home Health | Payer: Medicare Other | Attending: Student | Admitting: Student

## 2020-04-16 ENCOUNTER — Emergency Department (HOSPITAL_COMMUNITY): Payer: Medicare Other

## 2020-04-16 ENCOUNTER — Encounter (HOSPITAL_COMMUNITY): Payer: Self-pay | Admitting: Emergency Medicine

## 2020-04-16 DIAGNOSIS — F40240 Claustrophobia: Secondary | ICD-10-CM | POA: Diagnosis present

## 2020-04-16 DIAGNOSIS — Z5329 Procedure and treatment not carried out because of patient's decision for other reasons: Secondary | ICD-10-CM | POA: Diagnosis not present

## 2020-04-16 DIAGNOSIS — Z886 Allergy status to analgesic agent status: Secondary | ICD-10-CM

## 2020-04-16 DIAGNOSIS — I5033 Acute on chronic diastolic (congestive) heart failure: Secondary | ICD-10-CM

## 2020-04-16 DIAGNOSIS — Z833 Family history of diabetes mellitus: Secondary | ICD-10-CM

## 2020-04-16 DIAGNOSIS — N183 Chronic kidney disease, stage 3 unspecified: Secondary | ICD-10-CM

## 2020-04-16 DIAGNOSIS — J449 Chronic obstructive pulmonary disease, unspecified: Secondary | ICD-10-CM | POA: Diagnosis present

## 2020-04-16 DIAGNOSIS — Z20822 Contact with and (suspected) exposure to covid-19: Secondary | ICD-10-CM | POA: Diagnosis present

## 2020-04-16 DIAGNOSIS — K5900 Constipation, unspecified: Secondary | ICD-10-CM | POA: Diagnosis not present

## 2020-04-16 DIAGNOSIS — E1122 Type 2 diabetes mellitus with diabetic chronic kidney disease: Secondary | ICD-10-CM | POA: Diagnosis present

## 2020-04-16 DIAGNOSIS — I272 Pulmonary hypertension, unspecified: Secondary | ICD-10-CM

## 2020-04-16 DIAGNOSIS — Z885 Allergy status to narcotic agent status: Secondary | ICD-10-CM

## 2020-04-16 DIAGNOSIS — E1142 Type 2 diabetes mellitus with diabetic polyneuropathy: Secondary | ICD-10-CM | POA: Diagnosis present

## 2020-04-16 DIAGNOSIS — Z87891 Personal history of nicotine dependence: Secondary | ICD-10-CM

## 2020-04-16 DIAGNOSIS — F329 Major depressive disorder, single episode, unspecified: Secondary | ICD-10-CM | POA: Diagnosis present

## 2020-04-16 DIAGNOSIS — I5043 Acute on chronic combined systolic (congestive) and diastolic (congestive) heart failure: Secondary | ICD-10-CM | POA: Diagnosis not present

## 2020-04-16 DIAGNOSIS — I130 Hypertensive heart and chronic kidney disease with heart failure and stage 1 through stage 4 chronic kidney disease, or unspecified chronic kidney disease: Secondary | ICD-10-CM | POA: Diagnosis not present

## 2020-04-16 DIAGNOSIS — Z8249 Family history of ischemic heart disease and other diseases of the circulatory system: Secondary | ICD-10-CM

## 2020-04-16 DIAGNOSIS — Z8673 Personal history of transient ischemic attack (TIA), and cerebral infarction without residual deficits: Secondary | ICD-10-CM

## 2020-04-16 DIAGNOSIS — I248 Other forms of acute ischemic heart disease: Secondary | ICD-10-CM | POA: Diagnosis present

## 2020-04-16 DIAGNOSIS — K219 Gastro-esophageal reflux disease without esophagitis: Secondary | ICD-10-CM | POA: Diagnosis present

## 2020-04-16 DIAGNOSIS — I471 Supraventricular tachycardia: Secondary | ICD-10-CM | POA: Diagnosis not present

## 2020-04-16 DIAGNOSIS — N1832 Chronic kidney disease, stage 3b: Secondary | ICD-10-CM | POA: Diagnosis present

## 2020-04-16 DIAGNOSIS — Z6841 Body Mass Index (BMI) 40.0 and over, adult: Secondary | ICD-10-CM

## 2020-04-16 DIAGNOSIS — Z888 Allergy status to other drugs, medicaments and biological substances status: Secondary | ICD-10-CM

## 2020-04-16 DIAGNOSIS — I110 Hypertensive heart disease with heart failure: Secondary | ICD-10-CM | POA: Diagnosis present

## 2020-04-16 DIAGNOSIS — I10 Essential (primary) hypertension: Secondary | ICD-10-CM | POA: Diagnosis present

## 2020-04-16 DIAGNOSIS — N179 Acute kidney failure, unspecified: Secondary | ICD-10-CM | POA: Diagnosis not present

## 2020-04-16 DIAGNOSIS — I509 Heart failure, unspecified: Secondary | ICD-10-CM | POA: Diagnosis not present

## 2020-04-16 DIAGNOSIS — Z7982 Long term (current) use of aspirin: Secondary | ICD-10-CM

## 2020-04-16 DIAGNOSIS — Z794 Long term (current) use of insulin: Secondary | ICD-10-CM

## 2020-04-16 DIAGNOSIS — F419 Anxiety disorder, unspecified: Secondary | ICD-10-CM | POA: Diagnosis present

## 2020-04-16 DIAGNOSIS — Z79899 Other long term (current) drug therapy: Secondary | ICD-10-CM

## 2020-04-16 DIAGNOSIS — M109 Gout, unspecified: Secondary | ICD-10-CM | POA: Diagnosis present

## 2020-04-16 DIAGNOSIS — I2510 Atherosclerotic heart disease of native coronary artery without angina pectoris: Secondary | ICD-10-CM | POA: Diagnosis present

## 2020-04-16 DIAGNOSIS — Z7289 Other problems related to lifestyle: Secondary | ICD-10-CM

## 2020-04-16 DIAGNOSIS — I5032 Chronic diastolic (congestive) heart failure: Secondary | ICD-10-CM | POA: Diagnosis present

## 2020-04-16 DIAGNOSIS — G4733 Obstructive sleep apnea (adult) (pediatric): Secondary | ICD-10-CM | POA: Diagnosis present

## 2020-04-16 DIAGNOSIS — R0602 Shortness of breath: Secondary | ICD-10-CM | POA: Diagnosis not present

## 2020-04-16 DIAGNOSIS — Z90710 Acquired absence of both cervix and uterus: Secondary | ICD-10-CM

## 2020-04-16 DIAGNOSIS — N184 Chronic kidney disease, stage 4 (severe): Secondary | ICD-10-CM | POA: Diagnosis present

## 2020-04-16 DIAGNOSIS — E785 Hyperlipidemia, unspecified: Secondary | ICD-10-CM | POA: Diagnosis present

## 2020-04-16 HISTORY — DX: Dyspnea, unspecified: R06.00

## 2020-04-16 LAB — BASIC METABOLIC PANEL
Anion gap: 10 (ref 5–15)
Anion gap: 12 (ref 5–15)
BUN: 25 mg/dL — ABNORMAL HIGH (ref 8–23)
BUN: 26 mg/dL — ABNORMAL HIGH (ref 8–23)
CO2: 22 mmol/L (ref 22–32)
CO2: 25 mmol/L (ref 22–32)
Calcium: 8.8 mg/dL — ABNORMAL LOW (ref 8.9–10.3)
Calcium: 8.9 mg/dL (ref 8.9–10.3)
Chloride: 108 mmol/L (ref 98–111)
Chloride: 109 mmol/L (ref 98–111)
Creatinine, Ser: 1.97 mg/dL — ABNORMAL HIGH (ref 0.44–1.00)
Creatinine, Ser: 2.05 mg/dL — ABNORMAL HIGH (ref 0.44–1.00)
GFR calc Af Amer: 29 mL/min — ABNORMAL LOW (ref >60)
GFR calc Af Amer: 31 mL/min — ABNORMAL LOW (ref >60)
GFR calc non Af Amer: 25 mL/min — ABNORMAL LOW (ref >60)
GFR calc non Af Amer: 27 mL/min — ABNORMAL LOW (ref >60)
Glucose, Bld: 138 mg/dL — ABNORMAL HIGH (ref 70–99)
Glucose, Bld: 142 mg/dL — ABNORMAL HIGH (ref 70–99)
Potassium: 4.4 mmol/L (ref 3.5–5.1)
Potassium: 4.8 mmol/L (ref 3.5–5.1)
Sodium: 142 mmol/L (ref 135–145)
Sodium: 144 mmol/L (ref 135–145)

## 2020-04-16 LAB — CBC
HCT: 43.9 % (ref 36.0–46.0)
Hemoglobin: 13.1 g/dL (ref 12.0–15.0)
MCH: 28.6 pg (ref 26.0–34.0)
MCHC: 29.8 g/dL — ABNORMAL LOW (ref 30.0–36.0)
MCV: 95.9 fL (ref 80.0–100.0)
Platelets: 168 10*3/uL (ref 150–400)
RBC: 4.58 MIL/uL (ref 3.87–5.11)
RDW: 16 % — ABNORMAL HIGH (ref 11.5–15.5)
WBC: 5.5 10*3/uL (ref 4.0–10.5)
nRBC: 0 % (ref 0.0–0.2)

## 2020-04-16 LAB — TROPONIN I (HIGH SENSITIVITY)
Troponin I (High Sensitivity): 20 ng/L — ABNORMAL HIGH (ref <18)
Troponin I (High Sensitivity): 20 ng/L — ABNORMAL HIGH (ref <18)

## 2020-04-16 MED ORDER — FUROSEMIDE 10 MG/ML IJ SOLN
40.0000 mg | INTRAMUSCULAR | Status: AC
  Administered 2020-04-16: 40 mg via INTRAVENOUS
  Filled 2020-04-16: qty 4

## 2020-04-16 MED ORDER — SODIUM CHLORIDE 0.9% FLUSH
3.0000 mL | Freq: Once | INTRAVENOUS | Status: AC
  Administered 2020-04-16: 3 mL via INTRAVENOUS

## 2020-04-16 MED ORDER — FUROSEMIDE 10 MG/ML IJ SOLN
40.0000 mg | Freq: Two times a day (BID) | INTRAMUSCULAR | Status: DC
  Administered 2020-04-17 – 2020-04-18 (×3): 40 mg via INTRAVENOUS
  Filled 2020-04-16 (×3): qty 4

## 2020-04-16 NOTE — ED Provider Notes (Signed)
[facility] EMERGENCY DEPARTMENT Provider Note   CSN: [id number] Arrival date & time: 04/16/20  1247     History Chief Complaint  Patient presents with  . Shortness of Breath    [name] is a 64 y.o. female.  HPI   This patient is a 64 year old female, unfortunately she has multiple medical problems including COPD, congestive heart failure, pulmonary hypertension and had been admitted to the hospital last year due to severe congestive heart failure requiring a milrinone drip and a Swan-Ganz catheter. Ultimately she was weaned off of those medications and after a weeklong stay in the intensive care unit was able to be discharged to the home setting. Since that time she has had ongoing shortness of breath which has really rapidly progressed over the last week. She was taken off of her diuretic about a week ago by nephrology due to renal dysfunction and states that since that time she feels more swollen edematous and is becoming orthopneic at night. She will get some oxygen levels down into the 70% range, at this time she is 95%.  She showed up to the outpatient cardiac clinic today to have lab work drawn in anticipation for an outpatient SVT ablation by Dr. Lovena Le however when she showed up she told the staff that she was feeling more short of breath and was redirected to the emergency department and in fact brought here by the nurse at that clinic.  The patient denies significant coughing fevers diarrhea or dysuria, she has had increasing swelling of the legs orthopnea shortness of breath which is much much worse with exertion.  I personally reviewed the medical record, her last echocardiogram was in January 2021 at that time she had an ejection fraction of 45 to 50%. That is up from 25% 6 months prior when she was admitted.  Past Medical History:  Diagnosis Date  . Anxiety    doesn't take any meds for this  . Arteriosclerosis of coronary artery 08/31/2011    Myoview 9/12: EF 44, no ischemia // Myoview 8/18: EF 57, low risk, possible small area of inferior ischemia // Minor nonobstructive CAD by cardiac catheterization - [facility] 8/18: Proximal LAD 35, OM2 25  . Arthritis    knuckles  . Asthma   . Carotid artery disease ([place])    Carotid US 7/14: Bilateral ICA 40-59 // Carotid US ([facility]) 2/17: bilat plaque without sig stenosis  . Chronic diastolic CHF (congestive heart failure) ([place]) 07/26/2017   Echo 7/18:Severe conc LVH, EF 60-65, no RWMA, Gr 2 DD, MAC, mild MR // right and left heart cath 8/18: LVEDP 21  . Claustrophobia 07/26/2012  . COPD (chronic obstructive pulmonary disease) ([place])    "chronic bronchitis - about every winter"  . CSF leak    dizziness/headache - assoc symptoms // 2/2 encephalocele s/p skull base reconstruction  . Diverticulosis   . History of CVA (cerebrovascular accident) 03/2004   denies residual 07/26/2012  . Hx of gout   . Hyperlipidemia    takes Pravastatin daily  . Hypertensive heart disease with chronic diastolic congestive heart failure ([place]) 07/26/2017  . Internal hemorrhoids   . Kidney stones   . LVH (left ventricular hypertrophy)    Echo 8/18: Severe conc LVH, EF 60-65, no RWMA, Gr 2 DD, MAC, mild MR  . OSA (obstructive sleep apnea) 07/26/2012   prior CPAP - stopped due to being primary caregiver for dying parent (stopped in 2016)  . PSVT (paroxysmal supraventricular tachycardia) ([place])   .  Pulmonary hypertension, unspecified ([place]) 08/14/2017   cath 07/2017 >>PASP 80m Hg, PA mean 39 mm Hg  . Type II diabetes mellitus ([facility]     Patient Active Problem List   Diagnosis Date Noted  . Biventricular heart failure ([place] 09/07/2019  . Acute gout 09/07/2019  . Migraine syndrome 07/05/2019  . Edema 05/23/2019  . COPD (chronic obstructive pulmonary disease) ([place] 01/05/2019  . Type 2 diabetes mellitus with stage 3 chronic kidney disease, with long-term current use of insulin ([facility] 11/18/2018  . Screening for breast  cancer 06/30/2018  . Nicotine dependence, cigarettes, in remission 03/31/2018  . Pulmonary hypertension, unspecified ([facility] 08/14/2017  . Hypertensive heart disease with chronic diastolic congestive heart failure ([place] 07/26/2017  . Paroxysmal SVT (supraventricular tachycardia) ([facility] 07/26/2017  . COPD  GOLD 0 still smoking 07/13/2017  . Moderate episode of recurrent major depressive disorder ([facility] 07/12/2015  . OSA (obstructive sleep apnea) 10/17/2014  . Carotid artery disease ([facility] 06/03/2013  . Uric acid kidney stone 07/04/2012  . CSF leak from nose 10/19/2011  . Arteriosclerosis of coronary artery 08/31/2011  . Type 2 diabetes mellitus with diabetic polyneuropathy, with long-term current use of insulin ([facility] 08/31/2011  . HLD (hyperlipidemia) 07/02/2011  . Essential hypertension 06/20/2011  . Morbid obesity due to excess calories ([place] 06/20/2011    Past Surgical History:  Procedure Laterality Date  . ESOPHAGOGASTRODUODENOSCOPY (EGD) WITH PROPOFOL N/A 07/13/2015   Procedure: ESOPHAGOGASTRODUODENOSCOPY (EGD) WITH PROPOFOL;  Surgeon: PCarol Ada MD;  Location: [facility];  Service: Endoscopy;  Laterality: N/A;  . NASAL SINUS SURGERY  01/29/2012   Procedure: ENDOSCOPIC SINUS SURGERY WITH STEALTH;  Surgeon: MRuby Cola MD;  [place]: [facility]  Service: ENT;  Laterality: N/A;  . NM MYOVIEW LTD  08/25/2011   Low risk study, no evidence of ischemia, EF 44%  . REFRACTIVE SURGERY  ~ 2010   left  . RIGHT/LEFT HEART CATH AND CORONARY ANGIOGRAPHY N/A 07/30/2017   Procedure: RIGHT/LEFT HEART CATH AND CORONARY ANGIOGRAPHY;  Surgeon: JMartinique Peter M, MD;  [facility];  Service: Cardiovascular;  Laterality: N/A;  . SPHENOIDECTOMY  01/29/2012   Procedure: SPHENOIDECTOMY;  Surgeon: MRuby Cola MD;  Location: [facility]  Service: ENT;  Laterality: Left;  REPAIR OF LEFT SPHENOID    . TOTAL ABDOMINAL HYSTERECTOMY  1994     OB History    Gravida  0   Para      Term      Preterm       AB      Living        SAB      TAB      Ectopic      Multiple      Live Births              Family History  Problem Relation Age of Onset  . Colon cancer Sister 453 . Breast cancer Mother 658 . Alzheimer's disease Mother   . Diabetes Sister 474 . Heart failure Sister   . Diabetes Sister 433 . Lung cancer Father   . Breast cancer Sister   . Cancer Sister        mouth  . Anesthesia problems Neg Hx   . Hypotension Neg Hx   . Malignant hyperthermia Neg Hx   . Pseudochol deficiency Neg Hx     Social History   Tobacco Use  . Smoking status: Former Smoker    Packs/day: 0.50    Years: 35.00  Pack years: 17.50    Types: Cigarettes    Quit date: 08/16/2019    Years since quitting: 0.6  . Smokeless tobacco: Never Used  Substance Use Topics  . Alcohol use: Yes    Alcohol/week: 1.0 standard drinks    Types: 1 Glasses of wine per week    Comment: rarely  . Drug use: No    Home Medications Prior to Admission medications   Medication Sig Start Date End Date Taking? Authorizing Provider  acetaminophen (TYLENOL) 325 MG tablet Take 2 tablets (650 mg total) by mouth every 6 (six) hours as needed for mild pain. 06/30/18   Luetta Nutting, DO  albuterol (VENTOLIN HFA) 108 (90 Base) MCG/ACT inhaler TAKE 2 PUFFS BY MOUTH EVERY 6 HOURS AS NEEDED FOR WHEEZE OR SHORTNESS OF BREATH Patient taking differently: Inhale 2 puffs into the lungs every 6 (six) hours as needed for wheezing or shortness of breath.  12/19/19   Luetta Nutting, DO  allopurinol (ZYLOPRIM) 100 MG tablet Take 100 mg by mouth daily.  06/24/18   [provider]  aspirin EC 81 MG tablet Take 1 tablet (81 mg total) by mouth daily. 07/13/17   Richardson Dopp T, PA-C  benzonatate (TESSALON) 100 MG capsule Take 1 capsule (100 mg total) by mouth every 8 (eight) hours. 11/30/19   Avegno, Darrelyn Hillock, FNP  Blood Glucose Monitoring Suppl (ONETOUCH VERIO) w/Device KIT USE TO CHECK BLOOD GLUCOSE 4 TIMES A DAY .  DIAGNOSIS: TYPE 2 DIABETES E11.59 Patient taking differently: 1 each by Other route See admin instructions. USE TO CHECK BLOOD GLUCOSE 4 TIMES A DAY . DIAGNOSIS: TYPE 2 DIABETES E11.59 11/09/19   Luetta Nutting, DO  busPIRone (BUSPAR) 15 MG tablet Take 15 mg by mouth 2 (two) times daily.  06/17/16   [provider]  doxycycline (VIBRA-TABS) 100 MG tablet Take 1 tablet (100 mg total) by mouth 2 (two) times daily. 02/16/20   Trula Slade, DPM  empagliflozin (JARDIANCE) 10 MG TABS tablet Take 10 mg by mouth daily before breakfast. 10/13/19   Bensimhon, Shaune Pascal, MD  fenofibrate 54 MG tablet Take 1 tablet (54 mg total) by mouth daily. 04/13/20   Nche, Charlene Brooke, NP  FLUoxetine (PROZAC) 20 MG capsule Take 1 capsule (20 mg total) by mouth at bedtime. 04/13/20   Nche, Charlene Brooke, NP  gabapentin (NEURONTIN) 400 MG capsule Take 1 capsule (400 mg total) by mouth at bedtime. 08/26/19   Domenic Polite, MD  glucose blood [facility] VERIO) test strip For E11.22.  Use to check glucose qid Patient taking differently: 1 each by Other route See admin instructions. For E11.22.  Use to check glucose four times daily 11/09/19   Luetta Nutting, DO  hydrALAZINE (APRESOLINE) 50 MG tablet Take 1 tablet (50 mg total) by mouth every 8 (eight) hours. 09/22/19   Lyda Jester M, PA-C  insulin aspart (NOVOLOG FLEXPEN) 100 UNIT/ML FlexPen Inject 5 Units into the skin 3 (three) times daily with meals. Dx: E11.9 01/02/20   Luetta Nutting, DO  isosorbide mononitrate (IMDUR) 60 MG 24 hr tablet TAKE 1 TABLET (60 MG TOTAL) BY MOUTH DAILY FOR 30 DAYS. ** DO NOT CRUSH ** Patient taking differently: Take 60 mg by mouth daily.  03/20/20   Nche, Charlene Brooke, NP  nitroGLYCERIN (NITROSTAT) 0.4 MG SL tablet Place 1 tablet (0.4 mg total) under the tongue every 5 (five) minutes as needed. Patient taking differently: Place 0.4 mg under the tongue every 5 (five) minutes as needed for chest  pain.  07/27/17   Richardson Dopp T, PA-C   omeprazole (PRILOSEC) 40 MG capsule TAKE 1 CAPSULE BY MOUTH 30- 60 MIN BEFORE YOUR FIRST AND LAST MEALS OF THE DAY Patient taking differently: Take 40 mg by mouth See admin instructions. TAKE 1 CAPSULE BY MOUTH 30- 60 MIN BEFORE YOUR FIRST AND LAST MEALS OF THE DAY 09/26/19   Clegg, Amy D, NP  rosuvastatin (CRESTOR) 20 MG tablet Take 1 tablet (20 mg total) by mouth daily. 04/13/20 07/12/20  Nche, Charlene Brooke, NP  sacubitril-valsartan (ENTRESTO) 49-51 MG Take 1 tablet by mouth 2 (two) times daily. 04/04/20   Lyda Jester M, PA-C  topiramate (TOPAMAX) 25 MG tablet TAKE 1 TABLET BY MOUTH TWICE A DAY Patient taking differently: Take 25 mg by mouth 2 (two) times daily.  01/02/20   Luetta Nutting, DO  torsemide (DEMADEX) 20 MG tablet Take 1 tablet (20 mg total) by mouth daily as needed. Patient taking differently: Take 20 mg by mouth daily as needed (fluid retention.).  04/04/20   Lyda Jester M, PA-C  varenicline (CHANTIX) 0.5 MG tablet Take 1 tablet (0.5 mg total) by mouth 2 (two) times daily. 04/13/20   Nche, Charlene Brooke, NP  Verapamil HCl CR 300 MG CP24 TAKE 1 CAPSULE (300 MG TOTAL) BY MOUTH DAILY. PLEASE CALL TO SCHEDULE OVERDUE APPT WITH DR ROSS Patient taking differently: Take 300 mg by mouth daily.  04/04/20   Nche, Charlene Brooke, NP    Allergies    Atorvastatin, Ibuprofen, Metformin and related, Ace inhibitors, Hydrocodone-acetaminophen, and Albuterol  Review of Systems   Review of Systems  All other systems reviewed and are negative.   Physical Exam Updated Vital Signs BP (!) 154/65 (BP Location: Right Arm)   Pulse 64   Temp 97.9 F (36.6 C) (Oral)   Resp 18   SpO2 94%   Physical Exam Vitals and nursing note reviewed.  Constitutional:      General: She is not in acute distress.    Appearance: She is well-developed.  HENT:     Head: Normocephalic and atraumatic.     Mouth/Throat:     Pharynx: No oropharyngeal exudate.  Eyes:     General: No scleral icterus.        Right eye: No discharge.        Left eye: No discharge.     Conjunctiva/sclera: Conjunctivae normal.     Pupils: Pupils are equal, round, and reactive to light.  Neck:     Thyroid: No thyromegaly.     Vascular: No JVD.  Cardiovascular:     Rate and Rhythm: Normal rate and regular rhythm.     Heart sounds: Normal heart sounds. No murmur. No friction rub. No gallop.   Pulmonary:     Effort: Pulmonary effort is normal. Tachypnea present. No respiratory distress.     Breath sounds: Normal breath sounds. No wheezing or rales.  Abdominal:     General: Bowel sounds are normal. There is no distension.     Palpations: Abdomen is soft. There is no mass.     Tenderness: There is no abdominal tenderness.  Musculoskeletal:        General: No tenderness. Normal range of motion.     Cervical back: Normal range of motion and neck supple.     Right lower leg: Edema present.     Left lower leg: Edema present.  Lymphadenopathy:     Cervical: No cervical adenopathy.  Skin:    General: Skin is  warm and dry.     Findings: No erythema or rash.  Neurological:     Mental Status: She is alert.     Coordination: Coordination normal.  Psychiatric:        Behavior: Behavior normal.     ED Results / Procedures / Treatments   Labs (all labs ordered are listed, but only abnormal results are displayed) Labs Reviewed  BASIC METABOLIC PANEL - Abnormal; Notable for the following components:      Result Value   Glucose, Bld 142 (*)    BUN 25 (*)    Creatinine, Ser 1.97 (*)    GFR calc non Af Amer 27 (*)    GFR calc Af Amer 31 (*)    All other components within normal limits  CBC - Abnormal; Notable for the following components:   MCHC 29.8 (*)    RDW 16.0 (*)    All other components within normal limits  TROPONIN I (HIGH SENSITIVITY) - Abnormal; Notable for the following components:   Troponin I (High Sensitivity) 20 (*)    All other components within normal limits  TROPONIN I (HIGH SENSITIVITY) -  Abnormal; Notable for the following components:   Troponin I (High Sensitivity) 20 (*)    All other components within normal limits  BRAIN NATRIURETIC PEPTIDE    EKG EKG Interpretation  Date/Time:  Monday Apr 16 2020 12:50:33 EDT Ventricular Rate:  68 PR Interval:  176 QRS Duration: 86 QT Interval:  486 QTC Calculation: 516 R Axis:   -83 Text Interpretation: Normal sinus rhythm with sinus arrhythmia Left anterior fascicular block Septal infarct , age undetermined Prolonged QT Abnormal ECG No STEMI Confirmed by Octaviano Glow [phone]) on 04/16/2020 3:59:54 PM   Radiology DG Chest 2 View  Result Date: 04/16/2020 CLINICAL DATA:  Shortness of breath EXAM: CHEST - 2 VIEW COMPARISON:  08/20/2019 FINDINGS: Stable cardiomegaly. Atherosclerotic calcification of the aortic knob. No focal airspace consolidation, pleural effusion, or pneumothorax. No acute osseous findings. IMPRESSION: No active cardiopulmonary disease. Electronically Signed   By: Davina Poke D.O.   On: 04/16/2020 13:45    Procedures Procedures (including critical care time)  Medications Ordered in ED Medications  sodium chloride flush (NS) 0.9 % injection 3 mL (has no administration in time range)  furosemide (LASIX) injection 40 mg (has no administration in time range)    ED Course  I have reviewed the triage vital signs and the nursing notes.  Pertinent labs & imaging results that were available during my care of the patient were reviewed by me and considered in my medical decision making (see chart for details).    MDM Rules/Calculators/A&P                       Patient has lower extremity edema with some orthopnea and shortness of breath on exertion, and bilateral lower extremity edema. There is not appear to be any pulmonary edema on the x-ray, her oxygen level is currently 94% however given the rapid worsening of her symptoms off of the diuretic and her hypoxia at night when she sleeps with dyspnea on  exertion she will likely need to be diuresed. I would suggest that this happens as an inpatient. I discussed this with the cardiology on-call provider who recommended that she go to the hospitalist service to be diuresed.  The patient's troponin is elevated at 20 but it is chronically elevated and this is no different than baseline. I do not think she  is having a non-ST elevation MI.  EKG does not reveal any signs of significant arrhythmia or ischemia  Hospitalist paged for admission, Lasix ordered  I discussed the care with the hospitalist, he will come see the patient for admission.  Vitals:   04/16/20 1256 04/16/20 1458 04/16/20 1958  BP: 140/60 (!) 135/58 (!) 154/65  Pulse: 74 65 64  Resp: _0 Temp: 98.1 F (36.7 C) 97.9 F (36.6 C)   TempSrc: Oral Oral   SpO2: 94% 95% 94%     Final Clinical Impression(s) / ED Diagnoses Final diagnoses:  Acute on chronic congestive heart failure, unspecified heart failure type [facility])    Rx / DC Orders ED Discharge Orders    None       Noemi Chapel, MD 04/16/20 2232

## 2020-04-16 NOTE — Telephone Encounter (Signed)
Last OV 04/11/20 Last fill 04/13/20  #60/2

## 2020-04-16 NOTE — ED Triage Notes (Signed)
Pt reports sob that has been getting progressively worse over the last 1 month. Pt states last night she had an episode after walking up a steep hill where she became very short of breath" felt like I was dying and checked my 02 sats were 70". Pt states recently she was in the ICU for breathing difficulty.

## 2020-04-17 DIAGNOSIS — I130 Hypertensive heart and chronic kidney disease with heart failure and stage 1 through stage 4 chronic kidney disease, or unspecified chronic kidney disease: Secondary | ICD-10-CM | POA: Diagnosis present

## 2020-04-17 DIAGNOSIS — F329 Major depressive disorder, single episode, unspecified: Secondary | ICD-10-CM | POA: Diagnosis present

## 2020-04-17 DIAGNOSIS — E1122 Type 2 diabetes mellitus with diabetic chronic kidney disease: Secondary | ICD-10-CM | POA: Diagnosis present

## 2020-04-17 DIAGNOSIS — I471 Supraventricular tachycardia: Secondary | ICD-10-CM | POA: Diagnosis not present

## 2020-04-17 DIAGNOSIS — E1142 Type 2 diabetes mellitus with diabetic polyneuropathy: Secondary | ICD-10-CM | POA: Diagnosis present

## 2020-04-17 DIAGNOSIS — F40240 Claustrophobia: Secondary | ICD-10-CM | POA: Diagnosis present

## 2020-04-17 DIAGNOSIS — I5032 Chronic diastolic (congestive) heart failure: Secondary | ICD-10-CM

## 2020-04-17 DIAGNOSIS — J449 Chronic obstructive pulmonary disease, unspecified: Secondary | ICD-10-CM | POA: Diagnosis present

## 2020-04-17 DIAGNOSIS — K219 Gastro-esophageal reflux disease without esophagitis: Secondary | ICD-10-CM

## 2020-04-17 DIAGNOSIS — Z6841 Body Mass Index (BMI) 40.0 and over, adult: Secondary | ICD-10-CM | POA: Diagnosis not present

## 2020-04-17 DIAGNOSIS — I10 Essential (primary) hypertension: Secondary | ICD-10-CM

## 2020-04-17 DIAGNOSIS — Z20822 Contact with and (suspected) exposure to covid-19: Secondary | ICD-10-CM | POA: Diagnosis present

## 2020-04-17 DIAGNOSIS — K5900 Constipation, unspecified: Secondary | ICD-10-CM | POA: Diagnosis not present

## 2020-04-17 DIAGNOSIS — R609 Edema, unspecified: Secondary | ICD-10-CM | POA: Diagnosis not present

## 2020-04-17 DIAGNOSIS — I5033 Acute on chronic diastolic (congestive) heart failure: Secondary | ICD-10-CM

## 2020-04-17 DIAGNOSIS — N1832 Chronic kidney disease, stage 3b: Secondary | ICD-10-CM | POA: Diagnosis present

## 2020-04-17 DIAGNOSIS — I110 Hypertensive heart disease with heart failure: Secondary | ICD-10-CM

## 2020-04-17 DIAGNOSIS — Z90710 Acquired absence of both cervix and uterus: Secondary | ICD-10-CM | POA: Diagnosis not present

## 2020-04-17 DIAGNOSIS — N189 Chronic kidney disease, unspecified: Secondary | ICD-10-CM | POA: Diagnosis not present

## 2020-04-17 DIAGNOSIS — I5043 Acute on chronic combined systolic (congestive) and diastolic (congestive) heart failure: Secondary | ICD-10-CM | POA: Diagnosis present

## 2020-04-17 DIAGNOSIS — F419 Anxiety disorder, unspecified: Secondary | ICD-10-CM | POA: Diagnosis present

## 2020-04-17 DIAGNOSIS — G4733 Obstructive sleep apnea (adult) (pediatric): Secondary | ICD-10-CM | POA: Diagnosis present

## 2020-04-17 DIAGNOSIS — I509 Heart failure, unspecified: Secondary | ICD-10-CM

## 2020-04-17 DIAGNOSIS — I272 Pulmonary hypertension, unspecified: Secondary | ICD-10-CM | POA: Diagnosis present

## 2020-04-17 DIAGNOSIS — N179 Acute kidney failure, unspecified: Secondary | ICD-10-CM | POA: Diagnosis not present

## 2020-04-17 DIAGNOSIS — Z794 Long term (current) use of insulin: Secondary | ICD-10-CM | POA: Diagnosis not present

## 2020-04-17 DIAGNOSIS — R9431 Abnormal electrocardiogram [ECG] [EKG]: Secondary | ICD-10-CM | POA: Diagnosis not present

## 2020-04-17 DIAGNOSIS — I248 Other forms of acute ischemic heart disease: Secondary | ICD-10-CM | POA: Diagnosis present

## 2020-04-17 DIAGNOSIS — M109 Gout, unspecified: Secondary | ICD-10-CM | POA: Diagnosis present

## 2020-04-17 DIAGNOSIS — J9601 Acute respiratory failure with hypoxia: Secondary | ICD-10-CM | POA: Diagnosis not present

## 2020-04-17 DIAGNOSIS — Z8673 Personal history of transient ischemic attack (TIA), and cerebral infarction without residual deficits: Secondary | ICD-10-CM | POA: Diagnosis not present

## 2020-04-17 DIAGNOSIS — E785 Hyperlipidemia, unspecified: Secondary | ICD-10-CM | POA: Diagnosis present

## 2020-04-17 DIAGNOSIS — I2510 Atherosclerotic heart disease of native coronary artery without angina pectoris: Secondary | ICD-10-CM | POA: Diagnosis present

## 2020-04-17 LAB — GLUCOSE, CAPILLARY
Glucose-Capillary: 153 mg/dL — ABNORMAL HIGH (ref 70–99)
Glucose-Capillary: 104 mg/dL — ABNORMAL HIGH (ref 70–99)

## 2020-04-17 LAB — COMPREHENSIVE METABOLIC PANEL
ALT: 11 U/L (ref 0–44)
AST: 18 U/L (ref 15–41)
Albumin: 3.5 g/dL (ref 3.5–5.0)
Alkaline Phosphatase: 85 U/L (ref 38–126)
Anion gap: 11 (ref 5–15)
BUN: 26 mg/dL — ABNORMAL HIGH (ref 8–23)
CO2: 25 mmol/L (ref 22–32)
Calcium: 8.6 mg/dL — ABNORMAL LOW (ref 8.9–10.3)
Chloride: 108 mmol/L (ref 98–111)
Creatinine, Ser: 2.07 mg/dL — ABNORMAL HIGH (ref 0.44–1.00)
GFR calc Af Amer: 29 mL/min — ABNORMAL LOW (ref >60)
GFR calc non Af Amer: 25 mL/min — ABNORMAL LOW (ref >60)
Glucose, Bld: 174 mg/dL — ABNORMAL HIGH (ref 70–99)
Potassium: 3.6 mmol/L (ref 3.5–5.1)
Sodium: 144 mmol/L (ref 135–145)
Total Bilirubin: 0.9 mg/dL (ref 0.3–1.2)
Total Protein: 6.7 g/dL (ref 6.5–8.1)

## 2020-04-17 LAB — CBC WITH DIFFERENTIAL/PLATELET
Abs Immature Granulocytes: 0.02 10*3/uL (ref 0.00–0.07)
Basophils Absolute: 0 10*3/uL (ref 0.0–0.1)
Basophils Relative: 0 %
Eosinophils Absolute: 0.3 10*3/uL (ref 0.0–0.5)
Eosinophils Relative: 4 %
HCT: 45.1 % (ref 36.0–46.0)
Hemoglobin: 13.3 g/dL (ref 12.0–15.0)
Immature Granulocytes: 0 %
Lymphocytes Relative: 22 %
Lymphs Abs: 1.5 10*3/uL (ref 0.7–4.0)
MCH: 28.4 pg (ref 26.0–34.0)
MCHC: 29.5 g/dL — ABNORMAL LOW (ref 30.0–36.0)
MCV: 96.2 fL (ref 80.0–100.0)
Monocytes Absolute: 0.4 10*3/uL (ref 0.1–1.0)
Monocytes Relative: 6 %
Neutro Abs: 4.6 10*3/uL (ref 1.7–7.7)
Neutrophils Relative %: 68 %
Platelets: 175 10*3/uL (ref 150–400)
RBC: 4.69 MIL/uL (ref 3.87–5.11)
RDW: 16.3 % — ABNORMAL HIGH (ref 11.5–15.5)
WBC: 6.8 10*3/uL (ref 4.0–10.5)
nRBC: 0 % (ref 0.0–0.2)

## 2020-04-17 LAB — CBG MONITORING, ED
Glucose-Capillary: 147 mg/dL — ABNORMAL HIGH (ref 70–99)
Glucose-Capillary: 92 mg/dL (ref 70–99)

## 2020-04-17 LAB — BRAIN NATRIURETIC PEPTIDE: B Natriuretic Peptide: 1641.1 pg/mL — ABNORMAL HIGH (ref 0.0–100.0)

## 2020-04-17 LAB — MAGNESIUM: Magnesium: 2.1 mg/dL (ref 1.7–2.4)

## 2020-04-17 LAB — SARS CORONAVIRUS 2 BY RT PCR (HOSPITAL ORDER, PERFORMED IN ~~LOC~~ HOSPITAL LAB): SARS Coronavirus 2: NEGATIVE

## 2020-04-17 LAB — HEMOGLOBIN A1C
Hgb A1c MFr Bld: 6.3 % — ABNORMAL HIGH (ref 4.8–5.6)
Mean Plasma Glucose: 134.11 mg/dL

## 2020-04-17 MED ORDER — PANTOPRAZOLE SODIUM 40 MG PO TBEC
40.0000 mg | DELAYED_RELEASE_TABLET | Freq: Two times a day (BID) | ORAL | Status: DC
  Administered 2020-04-17 – 2020-04-20 (×7): 40 mg via ORAL
  Filled 2020-04-17 (×7): qty 1

## 2020-04-17 MED ORDER — NITROGLYCERIN 0.4 MG SL SUBL: 0.4000 mg | SUBLINGUAL_TABLET | SUBLINGUAL | Status: DC | PRN

## 2020-04-17 MED ORDER — HYDRALAZINE HCL 50 MG PO TABS
50.0000 mg | ORAL_TABLET | Freq: Three times a day (TID) | ORAL | Status: DC
  Administered 2020-04-17 – 2020-04-18 (×6): 50 mg via ORAL
  Filled 2020-04-17 (×2): qty 2
  Filled 2020-04-17: qty 1
  Filled 2020-04-17: qty 2
  Filled 2020-04-17 (×2): qty 1

## 2020-04-17 MED ORDER — VERAPAMIL HCL ER 180 MG PO TBCR
300.0000 mg | EXTENDED_RELEASE_TABLET | Freq: Every day | ORAL | Status: DC
  Administered 2020-04-17 – 2020-04-18 (×2): 300 mg via ORAL
  Filled 2020-04-17 (×3): qty 1

## 2020-04-17 MED ORDER — LABETALOL HCL 5 MG/ML IV SOLN
5.0000 mg | Freq: Once | INTRAVENOUS | Status: DC
  Filled 2020-04-17: qty 4

## 2020-04-17 MED ORDER — INSULIN ASPART 100 UNIT/ML ~~LOC~~ SOLN
5.0000 [IU] | Freq: Three times a day (TID) | SUBCUTANEOUS | Status: DC
  Administered 2020-04-17 – 2020-04-20 (×6): 5 [IU] via SUBCUTANEOUS

## 2020-04-17 MED ORDER — FLUOXETINE HCL 20 MG PO CAPS
20.0000 mg | ORAL_CAPSULE | Freq: Every day | ORAL | Status: DC
  Administered 2020-04-17 – 2020-04-19 (×4): 20 mg via ORAL
  Filled 2020-04-17 (×5): qty 1

## 2020-04-17 MED ORDER — SODIUM CHLORIDE 0.9% FLUSH
3.0000 mL | Freq: Two times a day (BID) | INTRAVENOUS | Status: DC
  Administered 2020-04-17 – 2020-04-19 (×7): 3 mL via INTRAVENOUS

## 2020-04-17 MED ORDER — ALBUTEROL SULFATE (2.5 MG/3ML) 0.083% IN NEBU: 2.5000 mg | INHALATION_SOLUTION | RESPIRATORY_TRACT | Status: DC | PRN

## 2020-04-17 MED ORDER — POLYETHYLENE GLYCOL 3350 17 G PO PACK: 17.0000 g | PACK | Freq: Every day | ORAL | Status: DC | PRN

## 2020-04-17 MED ORDER — SACUBITRIL-VALSARTAN 97-103 MG PO TABS
1.0000 | ORAL_TABLET | Freq: Two times a day (BID) | ORAL | Status: DC
  Administered 2020-04-17 – 2020-04-18 (×4): 1 via ORAL
  Filled 2020-04-17 (×6): qty 1

## 2020-04-17 MED ORDER — VERAPAMIL HCL ER 300 MG PO CP24: 300.0000 mg | ORAL_CAPSULE | Freq: Every day | ORAL | Status: DC

## 2020-04-17 MED ORDER — SODIUM CHLORIDE 0.9% FLUSH
3.0000 mL | INTRAVENOUS | Status: DC | PRN
  Administered 2020-04-17: 3 mL via INTRAVENOUS

## 2020-04-17 MED ORDER — GABAPENTIN 400 MG PO CAPS
400.0000 mg | ORAL_CAPSULE | Freq: Every day | ORAL | Status: DC
  Administered 2020-04-17 – 2020-04-19 (×4): 400 mg via ORAL
  Filled 2020-04-17 (×4): qty 1

## 2020-04-17 MED ORDER — ROSUVASTATIN CALCIUM 20 MG PO TABS
20.0000 mg | ORAL_TABLET | Freq: Every day | ORAL | Status: DC
  Administered 2020-04-17 – 2020-04-20 (×4): 20 mg via ORAL
  Filled 2020-04-17: qty 1
  Filled 2020-04-17: qty 4
  Filled 2020-04-17 (×2): qty 1

## 2020-04-17 MED ORDER — ISOSORBIDE MONONITRATE ER 60 MG PO TB24
60.0000 mg | ORAL_TABLET | Freq: Every day | ORAL | Status: DC
  Administered 2020-04-17 – 2020-04-18 (×2): 60 mg via ORAL
  Filled 2020-04-17: qty 1
  Filled 2020-04-17: qty 2

## 2020-04-17 MED ORDER — FENOFIBRATE 54 MG PO TABS
54.0000 mg | ORAL_TABLET | Freq: Every day | ORAL | Status: DC
  Administered 2020-04-17 – 2020-04-19 (×3): 54 mg via ORAL
  Filled 2020-04-17 (×4): qty 1

## 2020-04-17 MED ORDER — ALLOPURINOL 100 MG PO TABS
100.0000 mg | ORAL_TABLET | Freq: Every day | ORAL | Status: DC
  Administered 2020-04-17 – 2020-04-20 (×4): 100 mg via ORAL
  Filled 2020-04-17 (×5): qty 1

## 2020-04-17 MED ORDER — INSULIN ASPART 100 UNIT/ML ~~LOC~~ SOLN
0.0000 [IU] | Freq: Three times a day (TID) | SUBCUTANEOUS | Status: DC
  Administered 2020-04-17: 3 [IU] via SUBCUTANEOUS
  Administered 2020-04-17: 2 [IU] via SUBCUTANEOUS
  Administered 2020-04-18: 3 [IU] via SUBCUTANEOUS
  Administered 2020-04-19 (×2): 2 [IU] via SUBCUTANEOUS

## 2020-04-17 MED ORDER — ASPIRIN EC 81 MG PO TBEC
81.0000 mg | DELAYED_RELEASE_TABLET | Freq: Every day | ORAL | Status: DC
  Administered 2020-04-17 – 2020-04-20 (×4): 81 mg via ORAL
  Filled 2020-04-17 (×4): qty 1

## 2020-04-17 MED ORDER — ACETAMINOPHEN 325 MG PO TABS
650.0000 mg | ORAL_TABLET | ORAL | Status: DC | PRN
  Administered 2020-04-18: 650 mg via ORAL
  Filled 2020-04-17: qty 2

## 2020-04-17 MED ORDER — ONDANSETRON HCL 4 MG/2ML IJ SOLN: 4.0000 mg | Freq: Four times a day (QID) | INTRAMUSCULAR | Status: DC | PRN

## 2020-04-17 MED ORDER — BUSPIRONE HCL 5 MG PO TABS
15.0000 mg | ORAL_TABLET | Freq: Two times a day (BID) | ORAL | Status: DC
  Administered 2020-04-17 – 2020-04-20 (×8): 15 mg via ORAL
  Filled 2020-04-17 (×4): qty 1
  Filled 2020-04-17: qty 2
  Filled 2020-04-17 (×2): qty 1
  Filled 2020-04-17: qty 2

## 2020-04-17 MED ORDER — SODIUM CHLORIDE 0.9 % IV SOLN: 250.0000 mL | INTRAVENOUS | Status: DC | PRN

## 2020-04-17 MED ORDER — ENOXAPARIN SODIUM 40 MG/0.4ML ~~LOC~~ SOLN
40.0000 mg | Freq: Every day | SUBCUTANEOUS | Status: DC
  Administered 2020-04-17 – 2020-04-19 (×3): 40 mg via SUBCUTANEOUS
  Filled 2020-04-17 (×2): qty 0.4

## 2020-04-17 NOTE — H&P (Signed)
History and Physical    [name] MRN:[id number] DOB: 02/08/1956 DOA: 04/16/2020  PCP: Nche, Charlotte Lum, NP  Patient coming from: Home   Chief Complaint:  Chief Complaint  Patient presents with  . Shortness of Breath     HPI:    64-year-old female with past medical history of systolic and diastolic congestive heart failure, hypertension, obesity, COPD, pulmonary hypertension, previous CVA, paroxysmal SVT who presents to [facility] emergency department with complaints of shortness of breath.  Patient explains that for the past 3 to 4 weeks she has been experiencing shortness of breath.  The shortness of breath is mainly with exertion and improved with rest.  Shortness of breath was initially mild in intensity but has now become severe in intensity.  Patient states that her shortness of breath occurs after only walking several feet.  Shortness of breath is associated with progressively worsening bilateral lower extremity edema, increasing abdominal girth and an approximate 15 pound weight gain over the past several weeks.  Patient denies any associated chest pain, shortness of breath, fevers or sick contacts.  Patient does additionally complain of paroxysmal nocturnal dyspnea and pillow orthopnea.  Denies any confirmed contact with COVID-19.  Patient has not had a COVID-19 vaccination.  Additionally, patient states that her nephrologist discontinued her torsemide in the past 1 to 2 weeks although she explains that her symptoms began prior to this.  Patient symptoms continue to worsen until she eventually presented to [facility] emergency department for evaluation.  Upon evaluation in the emergency department patient was felt to clinically be suffering from acute congestive heart failure.  Patient was administered 40 mg of IV Lasix and the hospitalist group was then called to assess the patient for mission the hospital.  Review of Systems: A 10-system review of  systems has been performed and all systems are negative with the exception of what is listed in the HPI.    Past Medical History:  Diagnosis Date  . Anxiety    doesn't take any meds for this  . Arteriosclerosis of coronary artery 08/31/2011   Myoview 9/12: EF 44, no ischemia // Myoview 8/18: EF 57, low risk, possible small area of inferior ischemia // Minor nonobstructive CAD by cardiac catheterization - [facility] 8/18: Proximal LAD 35, OM2 25  . Arthritis    knuckles  . Asthma   . Carotid artery disease (HCC)    Carotid US 7/14: Bilateral ICA 40-59 // Carotid US ([facility]) 2/17: bilat plaque without sig stenosis  . Chronic diastolic CHF (congestive heart failure) (HCC) 07/26/2017   Echo 7/18:Severe conc LVH, EF 60-65, no RWMA, Gr 2 DD, MAC, mild MR // right and left heart cath 8/18: LVEDP 21  . Claustrophobia 07/26/2012  . COPD (chronic obstructive pulmonary disease) (HCC)    "chronic bronchitis - about every winter"  . CSF leak    dizziness/headache - assoc symptoms // 2/2 encephalocele s/p skull base reconstruction  . Diverticulosis   . History of CVA (cerebrovascular accident) 03/2004   denies residual 07/26/2012  . Hx of gout   . Hyperlipidemia    takes Pravastatin daily  . Hypertensive heart disease with chronic diastolic congestive heart failure (HCC) 07/26/2017  . Internal hemorrhoids   . Kidney stones   . LVH (left ventricular hypertrophy)    Echo 8/18: Severe conc LVH, EF 60-65, no RWMA, Gr 2 DD, MAC, mild MR  . OSA (obstructive sleep apnea) 07/26/2012   prior CPAP - stopped due to being   primary caregiver for dying parent (stopped in 2016)  . PSVT (paroxysmal supraventricular tachycardia) (HCC)   . Pulmonary hypertension, unspecified (HCC) 08/14/2017   cath 07/2017 >>PASP 57mm Hg, PA mean 39 mm Hg  . Type II diabetes mellitus (HCC)     Past Surgical History:  Procedure Laterality Date  . ESOPHAGOGASTRODUODENOSCOPY (EGD) WITH PROPOFOL N/A 07/13/2015   Procedure:  ESOPHAGOGASTRODUODENOSCOPY (EGD) WITH PROPOFOL;  Surgeon: Patrick Hung, MD;  Location: [facility] ENDOSCOPY;  Service: Endoscopy;  Laterality: N/A;  . NASAL SINUS SURGERY  01/29/2012   Procedure: ENDOSCOPIC SINUS SURGERY WITH STEALTH;  Surgeon: Mitchell Gore, MD;  Location: [facility] OR;  Service: ENT;  Laterality: N/A;  . NM MYOVIEW LTD  08/25/2011   Low risk study, no evidence of ischemia, EF 44%  . REFRACTIVE SURGERY  ~ 2010   left  . RIGHT/LEFT HEART CATH AND CORONARY ANGIOGRAPHY N/A 07/30/2017   Procedure: RIGHT/LEFT HEART CATH AND CORONARY ANGIOGRAPHY;  Surgeon: Jordan, Peter M, MD;  Location: [facility] INVASIVE CV LAB;  Service: Cardiovascular;  Laterality: N/A;  . SPHENOIDECTOMY  01/29/2012   Procedure: SPHENOIDECTOMY;  Surgeon: Mitchell Gore, MD;  Location: [facility] OR;  Service: ENT;  Laterality: Left;  REPAIR OF LEFT SPHENOID    . TOTAL ABDOMINAL HYSTERECTOMY  1994     reports that she quit smoking about 8 months ago. Her smoking use included cigarettes. She has a 17.50 pack-year smoking history. She has never used smokeless tobacco. She reports current alcohol use of about 1.0 standard drinks of alcohol per week. She reports that she does not use drugs.  Allergies  Allergen Reactions  . Atorvastatin Other (See Comments) and Nausea And Vomiting    "legs cramped; moderate to almost severe" "legs cramped; moderate to almost severe"  . Ibuprofen Other (See Comments)    Other reaction(s): Other (See Comments) Per pt, MD doesn't want pt to take Per pt, MD doesn't want pt to take  . Metformin And Related Nausea And Vomiting  . Ace Inhibitors Cough and Nausea And Vomiting  . Hydrocodone-Acetaminophen Nausea And Vomiting  . Albuterol Other (See Comments)    Caused SVT    Family History  Problem Relation Age of Onset  . Colon cancer Sister 49  . Breast cancer Mother 63  . Alzheimer's disease Mother   . Diabetes Sister 45  . Heart failure Sister   . Diabetes Sister 49  . Lung cancer Father   . Breast  cancer Sister   . Cancer Sister        mouth  . Anesthesia problems Neg Hx   . Hypotension Neg Hx   . Malignant hyperthermia Neg Hx   . Pseudochol deficiency Neg Hx      Prior to Admission medications   Medication Sig Start Date End Date Taking? Authorizing Provider  acetaminophen (TYLENOL) 325 MG tablet Take 2 tablets (650 mg total) by mouth every 6 (six) hours as needed for mild pain. 06/30/18  Yes Matthews, Cody, DO  albuterol (VENTOLIN HFA) 108 (90 Base) MCG/ACT inhaler TAKE 2 PUFFS BY MOUTH EVERY 6 HOURS AS NEEDED FOR WHEEZE OR SHORTNESS OF BREATH Patient taking differently: Inhale 2 puffs into the lungs every 6 (six) hours as needed for wheezing or shortness of breath.  12/19/19  Yes Matthews, Cody, DO  allopurinol (ZYLOPRIM) 100 MG tablet Take 100 mg by mouth daily.  06/24/18  Yes [provider]  aspirin EC 81 MG tablet Take 1 tablet (81 mg total) by mouth daily. 07/13/17  Yes Weaver,   Scott T, PA-C  benzonatate (TESSALON) 100 MG capsule Take 1 capsule (100 mg total) by mouth every 8 (eight) hours. 11/30/19  Yes Avegno, Darrelyn Hillock, FNP  Blood Glucose Monitoring Suppl (ONETOUCH VERIO) w/Device KIT USE TO CHECK BLOOD GLUCOSE 4 TIMES A DAY . DIAGNOSIS: TYPE 2 DIABETES E11.59 Patient taking differently: 1 each by Other route See admin instructions. USE TO CHECK BLOOD GLUCOSE 4 TIMES A DAY . DIAGNOSIS: TYPE 2 DIABETES E11.59 11/09/19  Yes Luetta Nutting, DO  busPIRone (BUSPAR) 15 MG tablet Take 15 mg by mouth 2 (two) times daily.  06/17/16  Yes [provider]  doxycycline (VIBRA-TABS) 100 MG tablet Take 1 tablet (100 mg total) by mouth 2 (two) times daily. 02/16/20  Yes Trula Slade, DPM  empagliflozin (JARDIANCE) 10 MG TABS tablet Take 10 mg by mouth daily before breakfast. 10/13/19  Yes Bensimhon, Shaune Pascal, MD  fenofibrate 54 MG tablet Take 1 tablet (54 mg total) by mouth daily. 04/13/20  Yes Nche, Charlene Brooke, NP  FLUoxetine (PROZAC) 20 MG capsule Take 1 capsule (20 mg  total) by mouth at bedtime. 04/13/20  Yes Nche, Charlene Brooke, NP  gabapentin (NEURONTIN) 400 MG capsule Take 1 capsule (400 mg total) by mouth at bedtime. 08/26/19  Yes Domenic Polite, MD  glucose blood [facility] VERIO) test strip For E11.22.  Use to check glucose qid Patient taking differently: 1 each by Other route See admin instructions. For E11.22.  Use to check glucose four times daily 11/09/19  Yes Luetta Nutting, DO  hydrALAZINE (APRESOLINE) 50 MG tablet Take 1 tablet (50 mg total) by mouth every 8 (eight) hours. 09/22/19  Yes Simmons, Brittainy M, PA-C  insulin aspart (NOVOLOG FLEXPEN) 100 UNIT/ML FlexPen Inject 5 Units into the skin 3 (three) times daily with meals. Dx: E11.9 01/02/20  Yes Luetta Nutting, DO  isosorbide mononitrate (IMDUR) 60 MG 24 hr tablet TAKE 1 TABLET (60 MG TOTAL) BY MOUTH DAILY FOR 30 DAYS. ** DO NOT CRUSH ** Patient taking differently: Take 60 mg by mouth daily.  03/20/20  Yes Nche, Charlene Brooke, NP  nitroGLYCERIN (NITROSTAT) 0.4 MG SL tablet Place 1 tablet (0.4 mg total) under the tongue every 5 (five) minutes as needed. Patient taking differently: Place 0.4 mg under the tongue every 5 (five) minutes as needed for chest pain.  07/27/17  Yes Weaver, Scott T, PA-C  omeprazole (PRILOSEC) 40 MG capsule TAKE 1 CAPSULE BY MOUTH 30- 60 MIN BEFORE YOUR FIRST AND LAST MEALS OF THE DAY Patient taking differently: Take 40 mg by mouth See admin instructions. TAKE 1 CAPSULE BY MOUTH 30- 60 MIN BEFORE YOUR FIRST AND LAST MEALS OF THE DAY 09/26/19  Yes Clegg, Amy D, NP  rosuvastatin (CRESTOR) 20 MG tablet Take 1 tablet (20 mg total) by mouth daily. 04/13/20 07/12/20 Yes Nche, Charlene Brooke, NP  sacubitril-valsartan (ENTRESTO) 49-51 MG Take 1 tablet by mouth 2 (two) times daily. 04/04/20  Yes Simmons, Brittainy M, PA-C  topiramate (TOPAMAX) 25 MG tablet TAKE 1 TABLET BY MOUTH TWICE A DAY Patient taking differently: Take 25 mg by mouth 2 (two) times daily.  01/02/20  Yes Luetta Nutting, DO   torsemide (DEMADEX) 20 MG tablet Take 1 tablet (20 mg total) by mouth daily as needed. Patient taking differently: Take 20 mg by mouth daily as needed (fluid retention.).  04/04/20  Yes Lyda Jester M, PA-C  varenicline (CHANTIX) 0.5 MG tablet Take 1 tablet (0.5 mg total) by mouth 2 (two) times daily. 04/13/20  Yes  Nche, Charlene Brooke, NP  Verapamil HCl CR 300 MG CP24 TAKE 1 CAPSULE (300 MG TOTAL) BY MOUTH DAILY. PLEASE CALL TO SCHEDULE OVERDUE APPT WITH DR ROSS Patient taking differently: Take 300 mg by mouth daily.  04/04/20  Yes NcheCharlene Brooke, NP    Physical Exam: Vitals:   04/16/20 1256 04/16/20 1458 04/16/20 1958 04/16/20 2345  BP: 140/60 (!) 135/58 (!) 154/65   Pulse: 74 65 64 68  Resp: _0 (!) 27  Temp: 98.1 F (36.7 C) 97.9 F (36.6 C)    TempSrc: Oral Oral    SpO2: 94% 95% 94% 96%     Constitutional: Acute alert and oriented x3, no associated distress.   Skin: no rashes, no lesions, good skin turgor noted. Eyes: Pupils are equally reactive to light.  No evidence of scleral icterus or conjunctival pallor.  ENMT: Moist mucous membranes noted.  Posterior pharynx clear of any exudate or lesions.   Neck: normal, supple, no masses, no thyromegaly.  Difficult to assess jugular venous pulse due to body habitus. Respiratory: Mild bibasilar rales noted without any evidence of wheezing.  Normal respiratory effort. No accessory muscle use.  Cardiovascular: Regular rate and rhythm, no murmurs / rubs / gallops.  Significant bilateral lower extremity pitting edema that tracks in the distal lower extremities up to the knees.  2+ pedal pulses. No carotid bruits.  Chest:   Nontender without crepitus or deformity.   Back:   Nontender without crepitus or deformity. Abdomen: Abdomen is extremely protuberant but soft and nontender.  No evidence of intra-abdominal masses.  Positive bowel sounds noted in all quadrants.   Musculoskeletal: No joint deformity upper and lower extremities.  Good ROM, no contractures. Normal muscle tone.  Neurologic: CN 2-12 grossly intact. Sensation intact, strength noted to be 5 out of 5 in all 4 extremities.  Patient is following all commands.  Patient is responsive to verbal stimuli.   Psychiatric: Patient presents as a normal mood with appropriate affect.  Patient seems to possess insight as to theircurrent situation.     Labs on Admission: I have personally reviewed following labs and imaging studies -   CBC: Recent Labs  Lab 04/11/20 0854 04/16/20 1304  WBC 7.3 5.5  NEUTROABS 5.0  --   HGB 14.2 13.1  HCT 43.7 43.9  MCV 90.4 95.9  PLT 206.0 371   Basic Metabolic Panel: Recent Labs  Lab 04/16/20 1240 04/16/20 1304  NA 142 144  K 4.4 4.8  CL 108 109  CO2 22 25  GLUCOSE 138* 142*  BUN 26* 25*  CREATININE 2.05* 1.97*  CALCIUM 8.8* 8.9   GFR: Estimated Creatinine Clearance: 34.8 mL/min (A) (by C-G formula based on SCr of 1.97 mg/dL (H)). Liver Function Tests: Recent Labs  Lab 04/11/20 0854  AST 13  ALT 8  ALKPHOS 91  BILITOT 0.6  PROT 6.8  ALBUMIN 4.2   No results for input(s): LIPASE, AMYLASE in the last 168 hours. No results for input(s): AMMONIA in the last 168 hours. Coagulation Profile: No results for input(s): INR, PROTIME in the last 168 hours. Cardiac Enzymes: No results for input(s): CKTOTAL, CKMB, CKMBINDEX, TROPONINI in the last 168 hours. BNP (last 3 results) Recent Labs    05/23/19 1201 12/14/19 1400  PROBNP 1,442.0* 905.0*   HbA1C: No results for input(s): HGBA1C in the last 72 hours. CBG: No results for input(s): GLUCAP in the last 168 hours. Lipid Profile: No results for input(s): CHOL, HDL, LDLCALC, TRIG, CHOLHDL,  LDLDIRECT in the last 72 hours. Thyroid Function Tests: No results for input(s): TSH, T4TOTAL, FREET4, T3FREE, THYROIDAB in the last 72 hours. Anemia Panel: No results for input(s): VITAMINB12, FOLATE, FERRITIN, TIBC, IRON, RETICCTPCT in the last 72 hours. Urine analysis:     Component Value Date/Time   COLORURINE AMBER (A) 08/18/2019 2150   APPEARANCEUR CLOUDY (A) 08/18/2019 2150   LABSPEC 1.017 08/18/2019 2150   PHURINE 5.0 08/18/2019 2150   GLUCOSEU NEGATIVE 08/18/2019 2150   HGBUR MODERATE (A) 08/18/2019 2150   BILIRUBINUR NEGATIVE 08/18/2019 2150   BILIRUBINUR 1+ 02/07/2019 1433   KETONESUR NEGATIVE 08/18/2019 2150   PROTEINUR >=300 (A) 08/18/2019 2150   UROBILINOGEN 0.2 02/07/2019 1433   UROBILINOGEN 0.2 07/07/2013 1353   NITRITE POSITIVE (A) 08/18/2019 2150   LEUKOCYTESUR LARGE (A) 08/18/2019 2150    Radiological Exams on Admission - Personally Reviewed: DG Chest 2 View  Result Date: 04/16/2020 CLINICAL DATA:  Shortness of breath EXAM: CHEST - 2 VIEW COMPARISON:  08/20/2019 FINDINGS: Stable cardiomegaly. Atherosclerotic calcification of the aortic knob. No focal airspace consolidation, pleural effusion, or pneumothorax. No acute osseous findings. IMPRESSION: No active cardiopulmonary disease. Electronically Signed   By: Davina Poke D.O.   On: 04/16/2020 13:45    EKG: Personally reviewed.  Rhythm is normal sinus rhythm with left anterior fascicular block with heart rate of 68 bpm.  Q waves noted in the anterior septal leads with poor R wave progression.  No dynamic ST segment changes appreciated.  Assessment/Plan Principal Problem:   Acute on chronic combined systolic and diastolic CHF (congestive heart failure) (HCC)   Patient presenting with several week history of progressively worsening peripheral edema, increasing abdominal girth, 15 pound weight gain, paroxysmal nocturnal dyspnea and pillow orthopnea all consistent with acute on chronic systolic and diastolic congestive heart failure.  Patient additionally had her home regimen torsemide discontinued by her nephrologist 1 to 2 weeks ago according to the patient.  Patient furthermore admits to eating fast food on a regular basis as of late.  Patient's been placed on Lasix 40 mg IV  every 12  Strict input and output monitoring  Monitoring renal function and electrolytes with serial chemistries  Monitoring patient on telemetry  Continuing home regimen of Entresto  Active Problems:   Essential hypertension  Continue home regimen of verapamil and Entresto    Type 2 diabetes mellitus with diabetic polyneuropathy, with long-term current use of insulin (HCC)   Accu-Cheks before every meal and nightly with sliding scale insulin  Continuing home regimen of prandial insulin therapy  Hemoglobin A1c pending  Holding home regimen of Jardiance  Will add basal insulin therapy if patient remains hyperglycemic    Paroxysmal SVT (supraventricular tachycardia) ([place])   Patient has ongoing paroxysmal SVT identified via Zio patch  Patient now following with Dr. Lovena Le with electrophysiology  Currently normal sinus rhythm  Continue home regimen of verapamil  Monitoring on telemetry    COPD (chronic obstructive pulmonary disease) ([place])   As needed bronchodilator therapy for shortness of breath and wheezing  No clinical evidence of COPD exacerbation at this time    Chronic kidney disease, stage 3b   Gradual decline in renal function in the past year  Strict input and output monitoring  Limiting nephrotoxic agents is much as possible  Serial chemistries to monitor renal function and electrolytes    GERD without esophagitis   Protonix twice daily    Code Status:  Full code Family Communication: Deferred  Status is: Observation  The patient remains OBS appropriate and will d/c before 2 midnights.  Dispo: The patient is from: Home              Anticipated d/c is to: Home              Anticipated d/c date is: 2 days              Patient currently is not medically stable to d/c.        Vernelle Emerald MD [facility] Pager [phone]  If 7PM-7AM, please contact night-coverage [email or web address] Use universal [place] password  for that web site. If you do not have the password, please call the hospital operator.  04/17/2020, 12:10 AM

## 2020-04-17 NOTE — ED Notes (Signed)
Lunch Tray Ordered 

## 2020-04-17 NOTE — Progress Notes (Signed)
PROGRESS NOTE    [name]  ZOX:[id number] DOB: 1956-07-06 DOA: 04/16/2020 PCP: Flossie Buffy, NP    Brief Narrative:  Patient was admitted to the hospital with a working diagnosis of acute diastolic heart failure decompensation.  65 year old female who presented with dyspnea.  She does have significant past medical history for heart failure, hypertension, obesity, COPD, pulmonary hypertension, paroxysmal SVT and history of CVA.  Patient reported 3 to 4 weeks of worsening dyspnea, worse with exertion, associated with lower extremity edema, increased abdominal girth and 15 pound weight gain.  About 1 to 2 weeks prior to hospitalization her torsemide was discontinued, by her nephrologist.  On her initial physical examination blood pressure 140/60, heart rate 74, respiratory rate 27, oxygen saturation 94%, temperature 98.1.  Her lungs had bibasilar rales, no wheezing, heart S1-S2, present, rhythmic, abdomen protuberant, positive lower extremity edema, bilateral and pitting up to the knees. Sodium 144, potassium 4.8, chloride 109, bicarb 25, glucose 142, BUN 25, creatinine 1.97, troponin I 20, BNP 1641, white count 5.5, hemoglobin 13.1, hematocrit 43.9, platelets 168.  SARS COVID-19 negative.  Chest x-ray with cardiomegaly, small bilateral pleural effusions, bilateral congested hilar vasculature.   EKG 68 bpm, left axis deviation, left anterior fascicular block, prolonged QTC 516, sinus rhythm, poor R wave progression, no ST segment or T wave changes, low voltage.  Assessment & Plan:   Principal Problem:   Acute on chronic diastolic CHF (congestive heart failure) (HCC) Active Problems:   Essential hypertension   Morbid obesity due to excess calories ([place])   Type 2 diabetes mellitus with diabetic polyneuropathy, with long-term current use of insulin (HCC)   Hypertensive heart disease with chronic diastolic congestive heart failure (HCC)   Paroxysmal SVT (supraventricular tachycardia)  (HCC)   Type 2 diabetes mellitus with stage 3 chronic kidney disease, with long-term current use of insulin (HCC)   COPD (chronic obstructive pulmonary disease) (HCC)   Chronic kidney disease, stage 3b   GERD without esophagitis   1. Acute systolic heart failure decompensation (EF 45 to 50%). Patient symptoms improving but not yet back to baseline. Her blood pressure this am 811 mmHg systolic.  Not documentation of urine output.   Will continue diuresis with IV furosemide 40 mg IV q 12H to target negative fluid balance. Continue afterload reduction with hydralazine, isosorbide and entresto.   Patient continue to have hypervolemia and has high risk for worsening hypervolemia.   2. Paroxysmal SVT/ prolonged Qtc. Patient with rate controlled, sinus rhythm.   Will continue diltiazem for now, continue telemetry monitoring and monitor Qtc, keep K at 4 and Mg at 2.   3. CKD stage 3b, (base cr at 2,0). Today renal function with serum cr at 2,0 with K at 3,6 and serum bicarbonate at 26, Mg at 2,1.   Will continue diuresis with furosemide, will add 20 kcl today and follow up renal function in am. Avoid hypotension and nephrotoxic medications.  4. T2DM/ dyslipdemia. Continue glucose cover and monitoring with insulin sliding scale, continue with pre-meal insulin 5 units.   Continue with rosuvastatin and fenofibrate  5. HTN. Continue blood pressure control with hydralazine, isosorbide and entresto.   6. Obesity/ depression. Calculate BMI 41,9 consistent with obesity class 3. Continue with buspirone and fluoxetine for depression. Patient will need follow up as outpatient.   Status is: Observation  The patient will require care spanning > 2 midnights and should be moved to inpatient because: IV treatments appropriate due to intensity of illness or  inability to take PO  Dispo: The patient is from: Home              Anticipated d/c is to: Home              Anticipated d/c date is: 2 days               Patient currently is not medically stable to d/c. Patient continue to need IV furosemide for diuresis and close inpatient monitoring of hemodynamics and renal function.        DVT prophylaxis:  heparin   Code Status:   full  Family Communication:  No family at the bedside      Subjective: Patient continue to have dyspnea and edema, some improvement in her symptoms but not yet back to baseline. No nausea or vomiting.   Objective: Vitals:   04/17/20 0600 04/17/20 0649 04/17/20 0800 04/17/20 0900  BP: (!) 151/59 (!) 137/55 (!) 139/57 (!) 160/71  Pulse:   63 64  Resp: 17 18 (!) 24 (!) 22  Temp:      TempSrc:      SpO2:   94% 95%    Intake/Output Summary (Last 24 hours) at 04/17/2020 1127 Last data filed at 04/17/2020 0919 Gross per 24 hour  Intake --  Output 1700 ml  Net -1700 ml   There were no vitals filed for this visit.  Examination:   General: Not in pain or dyspnea, deconditioned  Neurology: Awake and alert, non focal  E ENT: no pallor, no icterus, oral mucosa moist Cardiovascular: unable to assess JVD short and wide neck. S1-S2 present, rhythmic, no gallops, rubs, or murmurs. ++ pitting lower extremity edema. Pulmonary: positive breath sounds bilaterally, decreased air movement, no wheezing,or  Rhonchi, positive dependent rales. Gastrointestinal. Abdomen protuberant no organomegaly, non tender, no rebound or guarding Skin. No rashes Musculoskeletal: no joint deformities     Data Reviewed: I have personally reviewed following labs and imaging studies  CBC: Recent Labs  Lab 04/11/20 0854 04/16/20 1304 04/17/20 0342  WBC 7.3 5.5 6.8  NEUTROABS 5.0  --  4.6  HGB 14.2 13.1 13.3  HCT 43.7 43.9 45.1  MCV 90.4 95.9 96.2  PLT 206.0 168 993   Basic Metabolic Panel: Recent Labs  Lab 04/16/20 1240 04/16/20 1304 04/17/20 0342  NA 142 144 144  K 4.4 4.8 3.6  CL 108 109 108  CO2 22 25 25   GLUCOSE 138* 142* 174*  BUN 26* 25* 26*  CREATININE 2.05* 1.97*  2.07*  CALCIUM 8.8* 8.9 8.6*  MG  --   --  2.1   GFR: Estimated Creatinine Clearance: 33.1 mL/min (A) (by C-G formula based on SCr of 2.07 mg/dL (H)). Liver Function Tests: Recent Labs  Lab 04/11/20 0854 04/17/20 0342  AST 13 18  ALT 8 11  ALKPHOS 91 85  BILITOT 0.6 0.9  PROT 6.8 6.7  ALBUMIN 4.2 3.5   No results for input(s): LIPASE, AMYLASE in the last 168 hours. No results for input(s): AMMONIA in the last 168 hours. Coagulation Profile: No results for input(s): INR, PROTIME in the last 168 hours. Cardiac Enzymes: No results for input(s): CKTOTAL, CKMB, CKMBINDEX, TROPONINI in the last 168 hours. BNP (last 3 results) Recent Labs    05/23/19 1201 12/14/19 1400  PROBNP 1,442.0* 905.0*   HbA1C: Recent Labs    04/16/20 2301  HGBA1C 6.3*   CBG: Recent Labs  Lab 04/17/20 0909  GLUCAP 147*   Lipid Profile: No results  for input(s): CHOL, HDL, LDLCALC, TRIG, CHOLHDL, LDLDIRECT in the last 72 hours. Thyroid Function Tests: No results for input(s): TSH, T4TOTAL, FREET4, T3FREE, THYROIDAB in the last 72 hours. Anemia Panel: No results for input(s): VITAMINB12, FOLATE, FERRITIN, TIBC, IRON, RETICCTPCT in the last 72 hours.    Radiology Studies: I have reviewed all of the imaging during this hospital visit personally     Scheduled Meds: . allopurinol  100 mg Oral Daily  . aspirin EC  81 mg Oral Daily  . busPIRone  15 mg Oral BID  . enoxaparin (LOVENOX) injection  40 mg Subcutaneous Daily  . fenofibrate  54 mg Oral Daily  . FLUoxetine  20 mg Oral QHS  . furosemide  40 mg Intravenous BID  . gabapentin  400 mg Oral QHS  . hydrALAZINE  50 mg Oral Q8H  . insulin aspart  0-15 Units Subcutaneous TID AC & HS  . insulin aspart  5 Units Subcutaneous TID WC  . isosorbide mononitrate  60 mg Oral Daily  . labetalol  5 mg Intravenous Once  . pantoprazole  40 mg Oral BID AC  . rosuvastatin  20 mg Oral Daily  . sacubitril-valsartan  1 tablet Oral BID  . sodium chloride  flush  3 mL Intravenous Q12H  . verapamil  300 mg Oral Daily   Continuous Infusions: . sodium chloride       LOS: 0 days        Sherriann Szuch Gerome Apley, MD

## 2020-04-17 NOTE — ED Notes (Signed)
No response earlier when MD was pa\ged d//t bp. Repaging md

## 2020-04-17 NOTE — ED Notes (Signed)
Dr. Cyd Silence page d/t pt's bp

## 2020-04-17 NOTE — ED Notes (Signed)
Contacted admitting provider about bp and bp med.

## 2020-04-18 ENCOUNTER — Encounter (HOSPITAL_COMMUNITY): Payer: Medicare Other

## 2020-04-18 DIAGNOSIS — Z794 Long term (current) use of insulin: Secondary | ICD-10-CM

## 2020-04-18 DIAGNOSIS — R9431 Abnormal electrocardiogram [ECG] [EKG]: Secondary | ICD-10-CM

## 2020-04-18 DIAGNOSIS — N189 Chronic kidney disease, unspecified: Secondary | ICD-10-CM

## 2020-04-18 DIAGNOSIS — J449 Chronic obstructive pulmonary disease, unspecified: Secondary | ICD-10-CM

## 2020-04-18 DIAGNOSIS — N179 Acute kidney failure, unspecified: Secondary | ICD-10-CM

## 2020-04-18 DIAGNOSIS — E1142 Type 2 diabetes mellitus with diabetic polyneuropathy: Secondary | ICD-10-CM

## 2020-04-18 DIAGNOSIS — I5043 Acute on chronic combined systolic (congestive) and diastolic (congestive) heart failure: Secondary | ICD-10-CM

## 2020-04-18 LAB — MAGNESIUM: Magnesium: 1.8 mg/dL (ref 1.7–2.4)

## 2020-04-18 LAB — GLUCOSE, CAPILLARY
Glucose-Capillary: 115 mg/dL — ABNORMAL HIGH (ref 70–99)
Glucose-Capillary: 116 mg/dL — ABNORMAL HIGH (ref 70–99)
Glucose-Capillary: 152 mg/dL — ABNORMAL HIGH (ref 70–99)
Glucose-Capillary: 155 mg/dL — ABNORMAL HIGH (ref 70–99)

## 2020-04-18 LAB — BASIC METABOLIC PANEL
Anion gap: 13 (ref 5–15)
BUN: 30 mg/dL — ABNORMAL HIGH (ref 8–23)
CO2: 25 mmol/L (ref 22–32)
Calcium: 8.5 mg/dL — ABNORMAL LOW (ref 8.9–10.3)
Chloride: 102 mmol/L (ref 98–111)
Creatinine, Ser: 2.4 mg/dL — ABNORMAL HIGH (ref 0.44–1.00)
GFR calc Af Amer: 24 mL/min — ABNORMAL LOW (ref >60)
GFR calc non Af Amer: 21 mL/min — ABNORMAL LOW (ref >60)
Glucose, Bld: 130 mg/dL — ABNORMAL HIGH (ref 70–99)
Potassium: 3.8 mmol/L (ref 3.5–5.1)
Sodium: 140 mmol/L (ref 135–145)

## 2020-04-18 MED ORDER — HYDRALAZINE HCL 50 MG PO TABS
50.0000 mg | ORAL_TABLET | Freq: Three times a day (TID) | ORAL | Status: DC
  Administered 2020-04-19: 50 mg via ORAL
  Filled 2020-04-18: qty 1

## 2020-04-18 NOTE — Progress Notes (Signed)
PROGRESS NOTE  [name] IZT:[id number] DOB: October 25, 1956   PCP: Flossie Buffy, NP  Patient is from: Home  DOA: 04/16/2020 LOS: 1  Brief Narrative / Interim history: 64 year old female with history of combined CHF, pHTN, COPD not on oxygen, CVA, paroxysmal SVT, HTN and obesity presenting with progressive shortness of breath, DOE, orthopnea, PND, BLE edema and about 15 pound weight gain in the last 3 to 4 weeks and admitted for acute diastolic CHF.  In ED, BNP 1641.  CXR with cardiomegaly and bilateral pleural effusions.  Started on IV Lasix and admitted.  Subjective: Seen and examined earlier this morning.  No major events overnight of this morning.  Denies dyspnea at rest but endorses DOE.  Denies chest pain, nausea, vomiting or abdominal pain.  Denies UTI symptoms.  Having good urine output.  Objective: Vitals:   04/18/20 0617 04/18/20 0811 04/18/20 1004 04/18/20 1224  BP: 121/83 (!) 125/51 (!) 100/55 (!) 105/48  Pulse: 64 60 (!) 57 60  Resp: 18 18 20 20   Temp: (!) 97.4 F (36.3 C) 98 F (36.7 C) 97.7 F (36.5 C) 97.6 F (36.4 C)  TempSrc: Oral Oral Oral Oral  SpO2: 91% 95% 94% 96%  Weight: 107.6 kg     Height:        Intake/Output Summary (Last 24 hours) at 04/18/2020 1557 Last data filed at 04/18/2020 1000 Gross per 24 hour  Intake 480 ml  Output 2500 ml  Net -2020 ml   Filed Weights   04/17/20 1751 04/18/20 0617  Weight: 108.7 kg 107.6 kg    Examination:  GENERAL: No apparent distress.  Nontoxic. HEENT: MMM.  Vision and hearing grossly intact.  NECK: Supple.  No apparent JVD.  RESP: On RA.  No IWOB.  Fair aeration bilaterally. CVS:  RRR. Heart sounds normal.  ABD/GI/GU: BS+. Abd soft, NTND.  MSK/EXT:  Moves extremities. No apparent deformity.  RLE> LLE. SKIN: no apparent skin lesion or wound NEURO: Awake, alert and oriented appropriately.  No apparent focal neuro deficit. PSYCH: Calm. Normal affect.   Procedures:  None  Microbiology  summarized: Influenza PCR and COVID-19 PCR negative.  Assessment & Plan: Acute on chronic combined CHF: Echo in 12/2019 with EF of 45 to 50%,G2DD.  Presented with cardinal symptoms.  BNP elevated to 1600.  CXR concerning for CHF.  Diuresed with IV Lasix 40 mg twice daily with good UOP but developing AKI.  Still with significant DOE.  -Received IV Lasix 40 mg x 1 before lab results.  Hold afternoon Lasix.  Will transition to p.o. in the morning -Continue BiDil.  Hold Entresto in the setting of AKI. -Monitor fluid status, renal function and electrolytes. -Sodium and fluid restrictions.  Paroxysmal SVT/Prolonged QTc: Rate controlled on home Cardizem.  Scheduled for SVT ablation on 05/21/2020. -Continue home Cardizem CD. -Minimize QT prolonging drugs -Optimize K and Mg  Mildly elevated troponin: Likely demand ischemia.  Non-ACS pattern.  No chest pain. -Manage CHF as above  AKI on CKD-3D: b/d Cr 2.0> 1.97 (admit)> 2.40.  -Hold nephrotoxic meds -Recheck in the morning  Controlled DM-2: A1c 6.3%.  CBG was in fair range.  On insulin and Jardiance at home. Recent Labs    04/17/20 2159 04/18/20 0618 04/18/20 1221  GLUCAP 104* 115* 116*  -Continue current regimen  Essential HTN: Normotensive. -Cardiac meds as above  Morbid obesity: Body mass index is 43.38 kg/m. -Encourage lifestyle change to lose weight. -Could benefit from GLP-1 inhibitors.  Anxiety/depression: Stable -Continue home  medications  Right lower extremity swelling/edema -Obtain venous Doppler to exclude DVT.           DVT prophylaxis: Subcu Lovenox Code Status: Full code Family Communication: Patient and/or RN. Available if any question.  Status is: Inpatient  Remains inpatient appropriate because:due to CHF exacerbation and AKI   Dispo: The patient is from: Home              Anticipated d/c is to: Home              Anticipated d/c date is: 1 day              Patient currently is not medically  stable to d/c.        Consultants:  None   Sch Meds:  Scheduled Meds: . allopurinol  100 mg Oral Daily  . aspirin EC  81 mg Oral Daily  . busPIRone  15 mg Oral BID  . enoxaparin (LOVENOX) injection  40 mg Subcutaneous Daily  . fenofibrate  54 mg Oral Daily  . FLUoxetine  20 mg Oral QHS  . gabapentin  400 mg Oral QHS  . hydrALAZINE  50 mg Oral Q8H  . insulin aspart  0-15 Units Subcutaneous TID AC & HS  . insulin aspart  5 Units Subcutaneous TID WC  . isosorbide mononitrate  60 mg Oral Daily  . labetalol  5 mg Intravenous Once  . pantoprazole  40 mg Oral BID AC  . rosuvastatin  20 mg Oral Daily  . sodium chloride flush  3 mL Intravenous Q12H  . verapamil  300 mg Oral Daily   Continuous Infusions: . sodium chloride     PRN Meds:.sodium chloride, acetaminophen, albuterol, nitroGLYCERIN, ondansetron (ZOFRAN) IV, polyethylene glycol, sodium chloride flush  Antimicrobials: Anti-infectives (From admission, onward)   None       I have personally reviewed the following labs and images: CBC: Recent Labs  Lab 04/16/20 1304 04/17/20 0342  WBC 5.5 6.8  NEUTROABS  --  4.6  HGB 13.1 13.3  HCT 43.9 45.1  MCV 95.9 96.2  PLT 168 175   BMP &GFR Recent Labs  Lab 04/16/20 1240 04/16/20 1304 04/17/20 0342 04/18/20 0820  NA 142 144 144 140  K 4.4 4.8 3.6 3.8  CL 108 109 108 102  CO2 22 25 25 25   GLUCOSE 138* 142* 174* 130*  BUN 26* 25* 26* 30*  CREATININE 2.05* 1.97* 2.07* 2.40*  CALCIUM 8.8* 8.9 8.6* 8.5*  MG  --   --  2.1 1.8   Estimated Creatinine Clearance: 28 mL/min (A) (by C-G formula based on SCr of 2.4 mg/dL (H)). Liver & Pancreas: Recent Labs  Lab 04/17/20 0342  AST 18  ALT 11  ALKPHOS 85  BILITOT 0.9  PROT 6.7  ALBUMIN 3.5   No results for input(s): LIPASE, AMYLASE in the last 168 hours. No results for input(s): AMMONIA in the last 168 hours. Diabetic: Recent Labs    04/16/20 2301  HGBA1C 6.3*   Recent Labs  Lab 04/17/20 1222  04/17/20 1833 04/17/20 2159 04/18/20 0618 04/18/20 1221  GLUCAP 92 153* 104* 115* 116*   Cardiac Enzymes: No results for input(s): CKTOTAL, CKMB, CKMBINDEX, TROPONINI in the last 168 hours. Recent Labs    05/23/19 1201 12/14/19 1400  PROBNP 1,442.0* 905.0*   Coagulation Profile: No results for input(s): INR, PROTIME in the last 168 hours. Thyroid Function Tests: No results for input(s): TSH, T4TOTAL, FREET4, T3FREE, THYROIDAB in the last 72 hours.  Lipid Profile: No results for input(s): CHOL, HDL, LDLCALC, TRIG, CHOLHDL, LDLDIRECT in the last 72 hours. Anemia Panel: No results for input(s): VITAMINB12, FOLATE, FERRITIN, TIBC, IRON, RETICCTPCT in the last 72 hours. Urine analysis:    Component Value Date/Time   COLORURINE AMBER (A) 08/18/2019 2150   APPEARANCEUR CLOUDY (A) 08/18/2019 2150   LABSPEC 1.017 08/18/2019 2150   PHURINE 5.0 08/18/2019 2150   GLUCOSEU NEGATIVE 08/18/2019 2150   HGBUR MODERATE (A) 08/18/2019 2150   BILIRUBINUR NEGATIVE 08/18/2019 2150   BILIRUBINUR 1+ 02/07/2019 1433   KETONESUR NEGATIVE 08/18/2019 2150   PROTEINUR >=300 (A) 08/18/2019 2150   UROBILINOGEN 0.2 02/07/2019 1433   UROBILINOGEN 0.2 07/07/2013 1353   NITRITE POSITIVE (A) 08/18/2019 2150   LEUKOCYTESUR LARGE (A) 08/18/2019 2150   Sepsis Labs: Invalid input(s): PROCALCITONIN, [place]  Microbiology: Recent Results (from the past 240 hour(s))  SARS Coronavirus 2 by RT PCR (hospital order, performed in [facility] hospital lab) Nasopharyngeal Nasopharyngeal Swab     Status: None   Collection Time: 04/17/20 12:07 AM   Specimen: Nasopharyngeal Swab  Result Value Ref Range Status   SARS Coronavirus 2 NEGATIVE NEGATIVE Final    Comment: (NOTE) SARS-CoV-2 target nucleic acids are NOT DETECTED. The SARS-CoV-2 RNA is generally detectable in upper and lower respiratory specimens during the acute phase of infection. The lowest concentration of SARS-CoV-2 viral copies this assay can  detect is 250 copies / mL. A negative result does not preclude SARS-CoV-2 infection and should not be used as the sole basis for treatment or other patient management decisions.  A negative result may occur with improper specimen collection / handling, submission of specimen other than nasopharyngeal swab, presence of viral mutation(s) within the areas targeted by this assay, and inadequate number of viral copies (<250 copies / mL). A negative result must be combined with clinical observations, patient history, and epidemiological information. Fact Sheet for Patients:   StrictlyIdeas.no Fact Sheet for Healthcare Providers: BankingDealers.co.za This test is not yet approved or cleared  by the [place] FDA and has been authorized for detection and/or diagnosis of SARS-CoV-2 by FDA under an Emergency Use Authorization (EUA).  This EUA will remain in effect (meaning this test can be used) for the duration of the COVID-19 declaration under Section 564(b)(1) of the Act, 21 U.S.C. section 360bbb-3(b)(1), unless the authorization is terminated or revoked sooner. Performed at [facility], [place] [address] [place], [place] [address]     Radiology Studies: No results found.     Taye T. [place]  If 7PM-7AM, please contact night-coverage [email or web address] Password [facility] 04/18/2020, 3:57 PM

## 2020-04-18 NOTE — Plan of Care (Signed)

## 2020-04-18 NOTE — Plan of Care (Signed)
  Problem: Clinical Measurements: Goal: Respiratory complications will improve Outcome: Progressing   Problem: Activity: Goal: Risk for activity intolerance will decrease Outcome: Progressing   Problem: Elimination: Goal: Will not experience complications related to bowel motility Outcome: Progressing   

## 2020-04-18 NOTE — Procedures (Signed)
Patient declined to wear CPAP tonight, states she will be getting up often to void.

## 2020-04-19 ENCOUNTER — Encounter (HOSPITAL_COMMUNITY): Payer: Self-pay | Admitting: Internal Medicine

## 2020-04-19 ENCOUNTER — Inpatient Hospital Stay (HOSPITAL_COMMUNITY): Payer: Medicare Other

## 2020-04-19 DIAGNOSIS — R609 Edema, unspecified: Secondary | ICD-10-CM

## 2020-04-19 LAB — RENAL FUNCTION PANEL
Albumin: 3.5 g/dL (ref 3.5–5.0)
Anion gap: 11 (ref 5–15)
BUN: 39 mg/dL — ABNORMAL HIGH (ref 8–23)
CO2: 25 mmol/L (ref 22–32)
Calcium: 8 mg/dL — ABNORMAL LOW (ref 8.9–10.3)
Chloride: 104 mmol/L (ref 98–111)
Creatinine, Ser: 3.18 mg/dL — ABNORMAL HIGH (ref 0.44–1.00)
GFR calc Af Amer: 17 mL/min — ABNORMAL LOW (ref >60)
GFR calc non Af Amer: 15 mL/min — ABNORMAL LOW (ref >60)
Glucose, Bld: 101 mg/dL — ABNORMAL HIGH (ref 70–99)
Phosphorus: 5.5 mg/dL — ABNORMAL HIGH (ref 2.5–4.6)
Potassium: 4.2 mmol/L (ref 3.5–5.1)
Sodium: 140 mmol/L (ref 135–145)

## 2020-04-19 LAB — GLUCOSE, CAPILLARY
Glucose-Capillary: 102 mg/dL — ABNORMAL HIGH (ref 70–99)
Glucose-Capillary: 108 mg/dL — ABNORMAL HIGH (ref 70–99)
Glucose-Capillary: 141 mg/dL — ABNORMAL HIGH (ref 70–99)
Glucose-Capillary: 148 mg/dL — ABNORMAL HIGH (ref 70–99)

## 2020-04-19 LAB — MAGNESIUM: Magnesium: 2 mg/dL (ref 1.7–2.4)

## 2020-04-19 MED ORDER — SENNOSIDES-DOCUSATE SODIUM 8.6-50 MG PO TABS
1.0000 | ORAL_TABLET | Freq: Two times a day (BID) | ORAL | Status: DC | PRN
  Administered 2020-04-19 (×2): 1 via ORAL
  Filled 2020-04-19 (×2): qty 1

## 2020-04-19 MED ORDER — ISOSORBIDE MONONITRATE ER 30 MG PO TB24
30.0000 mg | ORAL_TABLET | Freq: Every day | ORAL | Status: DC
  Administered 2020-04-19 – 2020-04-20 (×2): 30 mg via ORAL
  Filled 2020-04-19 (×2): qty 1

## 2020-04-19 MED ORDER — HYDRALAZINE HCL 25 MG PO TABS
25.0000 mg | ORAL_TABLET | Freq: Three times a day (TID) | ORAL | Status: DC
  Administered 2020-04-19 – 2020-04-20 (×2): 25 mg via ORAL
  Filled 2020-04-19 (×3): qty 1

## 2020-04-19 MED ORDER — SODIUM CHLORIDE 0.9 % IV SOLN: INTRAVENOUS | Status: AC

## 2020-04-19 MED ORDER — POLYETHYLENE GLYCOL 3350 17 G PO PACK: 17.0000 g | PACK | Freq: Two times a day (BID) | ORAL | Status: DC | PRN

## 2020-04-19 MED ORDER — VERAPAMIL HCL ER 240 MG PO TBCR
240.0000 mg | EXTENDED_RELEASE_TABLET | Freq: Every day | ORAL | Status: DC
  Administered 2020-04-19 – 2020-04-20 (×2): 240 mg via ORAL
  Filled 2020-04-19 (×2): qty 1

## 2020-04-19 MED ORDER — ENOXAPARIN SODIUM 30 MG/0.3ML ~~LOC~~ SOLN
30.0000 mg | Freq: Every day | SUBCUTANEOUS | Status: DC
  Administered 2020-04-20: 30 mg via SUBCUTANEOUS
  Filled 2020-04-19: qty 0.3

## 2020-04-19 NOTE — Progress Notes (Signed)
Bilateral lower extremity venous duplex completed. Refer to "CV Proc" under chart review to view preliminary results.  04/19/2020 3:10 PM Kelby Aline., MHA, RVT, RDCS, RDMS

## 2020-04-19 NOTE — Progress Notes (Signed)
Patient refuses to wear CPAP, states it make her head hurt and causes a migraine.

## 2020-04-19 NOTE — Progress Notes (Signed)
Per CCMD, patient had a 14 beat run of v-tach. Patient denies any chest pain, shortness of breath, or dizziness, states "I feel fine, I do have SVT and I'm supposed to have a procedure soon for it". Current vitals are as follows:   04/19/20 0755  Vitals  Temp (!) 97.5 F (36.4 C)  Temp Source Oral  BP 108/61  MAP (mmHg) 76  BP Location Left Arm  BP Method Automatic  Patient Position (if appropriate) Sitting  Pulse Rate 68  Pulse Rate Source Monitor  Resp 16  Level of Consciousness  Level of Consciousness Alert  Oxygen Therapy  SpO2 94 %  O2 Device Room Air  MEWS Score  MEWS Temp 0  MEWS Systolic 0  MEWS Pulse 0  MEWS RR 0  MEWS LOC 0  MEWS Score 0  MEWS Score Color Christena Flake, MD paged.

## 2020-04-19 NOTE — Progress Notes (Signed)
PT Cancellation Note  Patient Details Name: [name] MRN: [id number] DOB: 15-Nov-1956   Cancelled Treatment:    Reason Eval/Treat Not Completed: Other (comment).  Politely declined, reporting she is up walking the halls and is doing fine.  Her resting O2 sats were 94%.  Will check on her tomorrow with her OK to see if she is still comfortable with her current functional level.   Ramond Dial 04/19/2020, 1:37 PM  Mee Hives, PT MS Acute Rehab Dept. Number: [place] [facility] [place]

## 2020-04-19 NOTE — Progress Notes (Signed)
PROGRESS NOTE  [name] TTS:[id number] DOB: 08-Sep-1956   PCP: Flossie Buffy, NP  Patient is from: Home  DOA: 04/16/2020 LOS: 2  Brief Narrative / Interim history: 64 year old female with history of combined CHF, pHTN, COPD not on oxygen, CVA, paroxysmal SVT, HTN and obesity presenting with progressive shortness of breath, DOE, orthopnea, PND, BLE edema and about 15 pound weight gain in the last 3 to 4 weeks and admitted for acute diastolic CHF.  In ED, BNP 1641.  CXR with cardiomegaly and bilateral pleural effusions.  Started on IV Lasix and admitted. Patient diuresed well with IV Lasix with improvement in his symptoms.  However, she developed AKI.  Diuretics held.  Subjective: Seen and examined earlier this morning.  No major events overnight of this morning.  No complaints other than constipation.  She denies chest pain, dyspnea, nausea, vomiting or UTI symptoms.  Blood pressure soft.  Renal function worse.  Objective: Vitals:   04/19/20 0437 04/19/20 0439 04/19/20 0755 04/19/20 0855  BP: (!) 116/48  108/61 (!) 125/59  Pulse: 61  68 66  Resp: 14  16 16   Temp: 98.3 F (36.8 C)  (!) 97.5 F (36.4 C)   TempSrc: Oral  Oral   SpO2: 93%  94% 92%  Weight:  108.4 kg    Height:        Intake/Output Summary (Last 24 hours) at 04/19/2020 1329 Last data filed at 04/19/2020 0814 Gross per 24 hour  Intake 903 ml  Output 1500 ml  Net -597 ml   Filed Weights   04/17/20 1751 04/18/20 0617 04/19/20 0439  Weight: 108.7 kg 107.6 kg 108.4 kg    Examination:  GENERAL: No apparent distress.  Nontoxic. HEENT: MMM.  Vision and hearing grossly intact.  NECK: Supple.  No apparent JVD.  RESP: On RA.  No IWOB.  Fair aeration bilaterally. CVS:  RRR. Heart sounds normal.  ABD/GI/GU: BS+. Abd soft, NTND.  MSK/EXT:  Moves extremities. No apparent deformity.  Trace edema bilaterally. SKIN: no apparent skin lesion or wound NEURO: Awake, alert and oriented appropriately.  No  apparent focal neuro deficit. PSYCH: Calm. Normal affect.   Procedures:  None  Microbiology summarized: Influenza PCR and COVID-19 PCR negative.  Assessment & Plan: Acute on chronic combined CHF: Echo in 12/2019 with EF of 45 to 50%,G2DD.  Presented with cardinal symptoms.  BNP elevated to 1600.  CXR concerning for CHF.  Diuresed with IV Lasix 40 mg with improvement in his symptoms but now with AKI. -IV Lasix on hold.  Will give gentle IV fluid back -Decrease Imdur, hydralazine and Cardizem given soft blood pressures -Monitor fluid status, renal function and electrolytes. -HH RN on discharge.  Paroxysmal SVT/Prolonged QTc: Rate controlled  Scheduled for SVT ablation on 05/21/2020. -Reduced home Cardizem CD to 240 mg given soft blood pressures. -Minimize QT prolonging drugs -Optimize K and Mg  Mildly elevated troponin: Likely demand ischemia.  Non-ACS pattern.  No chest pain. -Manage CHF as above  AKI on CKD-3D/azotemia: b/d Cr 2.0> 1.97 (admit)> 2.40> 3.18 -Hold nephrotoxic meds and reduce antihypertensives -Recheck in the morning  Controlled DM-2: A1c 6.3%.  CBG was in fair range.  On insulin and Jardiance at home. Recent Labs    04/18/20 2131 04/19/20 0625 04/19/20 1121  GLUCAP 155* 102* 108*  -Continue current regimen  Essential HTN: Normotensive. -Cardiac meds as above  Morbid obesity: Body mass index is 43.7 kg/m. -Encourage lifestyle change to lose weight. -Could benefit from GLP-1 inhibitors.  Anxiety/depression: Stable -Continue home medications  Right lower extremity swelling/edema -Obtain venous Doppler to exclude DVT.  Constipation -MiraLAX and Senokot-S twice daily as needed         DVT prophylaxis: Subcu Lovenox Code Status: Full code Family Communication: Patient and/or RN. Available if any question.  Status is: Inpatient  Remains inpatient appropriate because:Inpatient level of care appropriate due to severity of illness and AKI  requiring gentle IV fluid   Dispo: The patient is from: Home              Anticipated d/c is to: Home              Anticipated d/c date is: 1 day              Patient currently is not medically stable to d/c.        Consultants:  None   Sch Meds:  Scheduled Meds: . allopurinol  100 mg Oral Daily  . aspirin EC  81 mg Oral Daily  . busPIRone  15 mg Oral BID  . [START ON 04/20/2020] enoxaparin (LOVENOX) injection  30 mg Subcutaneous Daily  . fenofibrate  54 mg Oral Daily  . FLUoxetine  20 mg Oral QHS  . gabapentin  400 mg Oral QHS  . hydrALAZINE  25 mg Oral Q8H  . insulin aspart  0-15 Units Subcutaneous TID AC & HS  . insulin aspart  5 Units Subcutaneous TID WC  . isosorbide mononitrate  30 mg Oral Daily  . labetalol  5 mg Intravenous Once  . pantoprazole  40 mg Oral BID AC  . rosuvastatin  20 mg Oral Daily  . sodium chloride flush  3 mL Intravenous Q12H  . verapamil  240 mg Oral Daily   Continuous Infusions: . sodium chloride    . sodium chloride     PRN Meds:.sodium chloride, acetaminophen, albuterol, nitroGLYCERIN, ondansetron (ZOFRAN) IV, polyethylene glycol, senna-docusate, sodium chloride flush  Antimicrobials: Anti-infectives (From admission, onward)   None       I have personally reviewed the following labs and images: CBC: Recent Labs  Lab 04/16/20 1304 04/17/20 0342  WBC 5.5 6.8  NEUTROABS  --  4.6  HGB 13.1 13.3  HCT 43.9 45.1  MCV 95.9 96.2  PLT 168 175   BMP &GFR Recent Labs  Lab 04/16/20 1240 04/16/20 1304 04/17/20 0342 04/18/20 0820 04/19/20 0623  NA 142 144 144 140 140  K 4.4 4.8 3.6 3.8 4.2  CL 108 109 108 102 104  CO2 22 25 25 25 25   GLUCOSE 138* 142* 174* 130* 101*  BUN 26* 25* 26* 30* 39*  CREATININE 2.05* 1.97* 2.07* 2.40* 3.18*  CALCIUM 8.8* 8.9 8.6* 8.5* 8.0*  MG  --   --  2.1 1.8 2.0  PHOS  --   --   --   --  5.5*   Estimated Creatinine Clearance: 21.3 mL/min (A) (by C-G formula based on SCr of 3.18 mg/dL  (H)). Liver & Pancreas: Recent Labs  Lab 04/17/20 0342 04/19/20 0623  AST 18  --   ALT 11  --   ALKPHOS 85  --   BILITOT 0.9  --   PROT 6.7  --   ALBUMIN 3.5 3.5   No results for input(s): LIPASE, AMYLASE in the last 168 hours. No results for input(s): AMMONIA in the last 168 hours. Diabetic: Recent Labs    04/16/20 2301  HGBA1C 6.3*   Recent Labs  Lab 04/18/20 1221 04/18/20 1636  04/18/20 2131 04/19/20 0625 04/19/20 1121  GLUCAP 116* 152* 155* 102* 108*   Cardiac Enzymes: No results for input(s): CKTOTAL, CKMB, CKMBINDEX, TROPONINI in the last 168 hours. Recent Labs    05/23/19 1201 12/14/19 1400  PROBNP 1,442.0* 905.0*   Coagulation Profile: No results for input(s): INR, PROTIME in the last 168 hours. Thyroid Function Tests: No results for input(s): TSH, T4TOTAL, FREET4, T3FREE, THYROIDAB in the last 72 hours. Lipid Profile: No results for input(s): CHOL, HDL, LDLCALC, TRIG, CHOLHDL, LDLDIRECT in the last 72 hours. Anemia Panel: No results for input(s): VITAMINB12, FOLATE, FERRITIN, TIBC, IRON, RETICCTPCT in the last 72 hours. Urine analysis:    Component Value Date/Time   COLORURINE AMBER (A) 08/18/2019 2150   APPEARANCEUR CLOUDY (A) 08/18/2019 2150   LABSPEC 1.017 08/18/2019 2150   PHURINE 5.0 08/18/2019 2150   GLUCOSEU NEGATIVE 08/18/2019 2150   HGBUR MODERATE (A) 08/18/2019 2150   BILIRUBINUR NEGATIVE 08/18/2019 2150   BILIRUBINUR 1+ 02/07/2019 1433   KETONESUR NEGATIVE 08/18/2019 2150   PROTEINUR >=300 (A) 08/18/2019 2150   UROBILINOGEN 0.2 02/07/2019 1433   UROBILINOGEN 0.2 07/07/2013 1353   NITRITE POSITIVE (A) 08/18/2019 2150   LEUKOCYTESUR LARGE (A) 08/18/2019 2150   Sepsis Labs: Invalid input(s): PROCALCITONIN, [place]  Microbiology: Recent Results (from the past 240 hour(s))  SARS Coronavirus 2 by RT PCR (hospital order, performed in [facility] hospital lab) Nasopharyngeal Nasopharyngeal Swab     Status: None   Collection Time:  04/17/20 12:07 AM   Specimen: Nasopharyngeal Swab  Result Value Ref Range Status   SARS Coronavirus 2 NEGATIVE NEGATIVE Final    Comment: (NOTE) SARS-CoV-2 target nucleic acids are NOT DETECTED. The SARS-CoV-2 RNA is generally detectable in upper and lower respiratory specimens during the acute phase of infection. The lowest concentration of SARS-CoV-2 viral copies this assay can detect is 250 copies / mL. A negative result does not preclude SARS-CoV-2 infection and should not be used as the sole basis for treatment or other patient management decisions.  A negative result may occur with improper specimen collection / handling, submission of specimen other than nasopharyngeal swab, presence of viral mutation(s) within the areas targeted by this assay, and inadequate number of viral copies (<250 copies / mL). A negative result must be combined with clinical observations, patient history, and epidemiological information. Fact Sheet for Patients:   StrictlyIdeas.no Fact Sheet for Healthcare Providers: BankingDealers.co.za This test is not yet approved or cleared  by the [place] FDA and has been authorized for detection and/or diagnosis of SARS-CoV-2 by FDA under an Emergency Use Authorization (EUA).  This EUA will remain in effect (meaning this test can be used) for the duration of the COVID-19 declaration under Section 564(b)(1) of the Act, 21 U.S.C. section 360bbb-3(b)(1), unless the authorization is terminated or revoked sooner. Performed at [facility], [place] [address], [place], [place] [address]     Radiology Studies: No results found.     Khyan Oats T. Burnett  If 7PM-7AM, please contact night-coverage [email or web address] Password [facility] 04/19/2020, 1:29 PM

## 2020-04-19 NOTE — TOC Transition Note (Addendum)
Transition of Care [facility]) - CM/SW Discharge Note   Patient Details  Name: [name] MRN: [id number] Date of Birth: 05-07-1956  [facility] Care [facility]) CM/SW Contact:  Zenon Mayo, RN Phone Number: 04/19/2020, 9:17 AM   Clinical Narrative:    NCM spoke with patient, offered choice for [facility] for CHF disease management, she states she does not have a preference.   She states she needs a walker.  Referral given to [facility] with [facility]. NCM made referral to [facility] with [facility] for [facility] CHF disease management. She is able to take referral.  Per Thedore Mins with [employer] patient decided she did not want the rolling walker because she has a rollator and she does not want to private pay for it.    Final next level of care: [place] Barriers to Discharge: Continued Medical Work up   Patient Goals and CMS Choice Patient states their goals for this hospitalization and ongoing recovery are:: get better CMS Medicare.gov Compare Post Acute Care list provided to:: Patient Choice offered to / list presented to : Patient  Discharge Placement                       Discharge Plan and Services In-house Referral: NA Discharge Planning Services: CM Consult Post Acute Care Choice: Home Health          DME Arranged: Walker rolling DME Agency: [employer] Date DME Agency Contacted: 04/19/20 Time DME Agency Contacted: [phone] Representative spoke with at DME Agency: zach HH Arranged: RN, Disease Management [place] Agency: [place] Date [place]: 04/19/20 Time [place]: 0915 Representative spoke with at [place]: [place] ([place]) Interventions     Readmission Risk Interventions Readmission Risk Prevention Plan 04/19/2020  Transportation Screening Complete  PCP or Specialist Appt within 3-5 Days Complete  HRI or [place] Complete  Social Work Consult for [place] Planning/Counseling  [place] Not Applicable  Medication Review Press photographer) Complete  Some recent data might be hidden

## 2020-04-19 NOTE — TOC Initial Note (Signed)
Transition of Care [facility]) - Initial/Assessment Note    Patient Details  Name: [name] MRN: [id number] Date of Birth: [birth year]  Transition of Care [facility]) CM/SW Contact:    Zenon Mayo, RN Phone Number: 04/19/2020, 8:57 AM  Clinical Narrative:                 NCM spoke with patient, offered choice for [facility] for CHF disease management, she states she does not have a preference.   She states she needs a walker.  Referral given to [facility] with [employer].  Expected Discharge Plan: Home/Self Care Barriers to Discharge: Continued Medical Work up(check cret level)   Patient Goals and CMS Choice Patient states their goals for this hospitalization and ongoing recovery are:: get better CMS Medicare.gov Compare Post Acute Care list provided to:: Patient Choice offered to / list presented to : Patient  Expected Discharge Plan and Services Expected Discharge Plan: Home/Self Care In-house Referral: NA Discharge Planning Services: CM Consult Post Acute Care Choice: Home Health Living arrangements for the past 2 months: Single Family Home                 DME Arranged: Walker rolling DME Agency: [facility] Date DME Agency Contacted: 04/19/20 Time DME Agency Contacted: [phone] Representative spoke with at DME Agency: zach HH Arranged: RN, Disease Management          Prior Living Arrangements/Services Living arrangements for the past 2 months: Single Family Home Lives with:: Self Patient language and need for interpreter reviewed:: Yes Do you feel safe going back to the place where you live?: Yes      Need for Family Participation in Patient Care: No (Comment) Care giver support system in place?: No (comment) Current home services: DME(rollator) Criminal Activity/Legal Involvement Pertinent to Current Situation/Hospitalization: No - Comment as needed  Activities of Daily Living      Permission Sought/Granted                  Emotional Assessment Appearance::  Appears stated age     Orientation: : Oriented to Place, Oriented to Self, Oriented to  Time, Oriented to Situation Alcohol / Substance Use: Not Applicable Psych Involvement: No (comment)  Admission diagnosis:  Acute on chronic combined systolic (congestive) and diastolic (congestive) heart failure (HCC) [I50.43] Heart failure ([place]) [I50.9] Acute on chronic congestive heart failure, unspecified heart failure type ([place]) [I50.9] Patient Active Problem List   Diagnosis Date Noted  . Acute on chronic diastolic CHF (congestive heart failure) ([place]) 04/17/2020  . Heart failure ([place]) 04/17/2020  . Acute on chronic combined systolic and diastolic CHF (congestive heart failure) ([place]) 04/16/2020  . Chronic kidney disease, stage 3b 04/16/2020  . GERD without esophagitis 04/16/2020  . Biventricular heart failure ([place]) 09/07/2019  . Acute gout 09/07/2019  . Migraine syndrome 07/05/2019  . Edema 05/23/2019  . COPD (chronic obstructive pulmonary disease) ([place]) 01/05/2019  . Type 2 diabetes mellitus with stage 3 chronic kidney disease, with long-term current use of insulin ([place]) 11/18/2018  . Screening for breast cancer 06/30/2018  . Nicotine dependence, cigarettes, in remission 03/31/2018  . Pulmonary hypertension, unspecified ([place]) 08/14/2017  . Hypertensive heart disease with chronic diastolic congestive heart failure ([place]) 07/26/2017  . Paroxysmal SVT (supraventricular tachycardia) ([place]) 07/26/2017  . COPD  GOLD 0 still smoking 07/13/2017  . Moderate episode of recurrent major depressive disorder ([place]) 07/12/2015  . OSA (obstructive sleep apnea) 10/17/2014  . Carotid artery disease ([place]) 06/03/2013  . Uric acid kidney  stone 07/04/2012  . CSF leak from nose 10/19/2011  . Arteriosclerosis of coronary artery 08/31/2011  . Type 2 diabetes mellitus with diabetic polyneuropathy, with long-term current use of insulin ([place]) 08/31/2011  . HLD (hyperlipidemia) 07/02/2011  . Essential hypertension  06/20/2011  . Morbid obesity due to excess calories ([place]) 06/20/2011   PCP:  Flossie Buffy, NP Pharmacy:   [id number] [place], [place] [address] Phone: [phone] Fax: [phone]  [facility] [place] [address] Phone: [phone] Fax: [phone] [place], [place] [address] [place] [address] Phone: [phone] Fax: [place], [place] [address] [place] [address] Phone: [phone] Fax: [phone]     Social Determinants of Health (SDOH) Interventions    Readmission Risk Interventions Readmission Risk Prevention Plan 04/19/2020  Transportation Screening Complete  PCP or Specialist Appt within 3-5 Days Complete  HRI or [place] Complete  Social Work Consult for [place] Planning/Counseling Complete  Palliative Care Screening Not Applicable  Medication Review Press photographer) Complete  Some recent data might be hidden

## 2020-04-20 ENCOUNTER — Telehealth (HOSPITAL_COMMUNITY): Payer: Self-pay | Admitting: Vascular Surgery

## 2020-04-20 DIAGNOSIS — G4733 Obstructive sleep apnea (adult) (pediatric): Secondary | ICD-10-CM

## 2020-04-20 DIAGNOSIS — J9601 Acute respiratory failure with hypoxia: Secondary | ICD-10-CM

## 2020-04-20 LAB — GLUCOSE, CAPILLARY: Glucose-Capillary: 116 mg/dL — ABNORMAL HIGH (ref 70–99)

## 2020-04-20 LAB — RENAL FUNCTION PANEL
Albumin: 3.3 g/dL — ABNORMAL LOW (ref 3.5–5.0)
Anion gap: 11 (ref 5–15)
BUN: 44 mg/dL — ABNORMAL HIGH (ref 8–23)
CO2: 24 mmol/L (ref 22–32)
Calcium: 8 mg/dL — ABNORMAL LOW (ref 8.9–10.3)
Chloride: 106 mmol/L (ref 98–111)
Creatinine, Ser: 3.33 mg/dL — ABNORMAL HIGH (ref 0.44–1.00)
GFR calc Af Amer: 16 mL/min — ABNORMAL LOW (ref >60)
GFR calc non Af Amer: 14 mL/min — ABNORMAL LOW (ref >60)
Glucose, Bld: 119 mg/dL — ABNORMAL HIGH (ref 70–99)
Phosphorus: 5.2 mg/dL — ABNORMAL HIGH (ref 2.5–4.6)
Potassium: 4.2 mmol/L (ref 3.5–5.1)
Sodium: 141 mmol/L (ref 135–145)

## 2020-04-20 LAB — MAGNESIUM: Magnesium: 2.1 mg/dL (ref 1.7–2.4)

## 2020-04-20 MED ORDER — TORSEMIDE 20 MG PO TABS: 20.0000 mg | ORAL_TABLET | Freq: Every day | ORAL | Status: DC | PRN

## 2020-04-20 NOTE — Telephone Encounter (Signed)
Returned pt call to make hosp f/u... pt is scheduled 5/27 @ 9;30 ASKED PT TO CALL BACK TO confirm

## 2020-04-20 NOTE — Evaluation (Signed)
Physical Therapy One Time Evaluation Patient Details Name: [name] MRN: [id number] DOB: [birth year] Today's Date: 04/20/2020   History of Present Illness  64 year old female with history of combined CHF, pHTN, COPD not on oxygen, CVA, paroxysmal SVT, HTN and obesity presenting with progressive shortness of breath, DOE, orthopnea, PND, BLE edema and about 15 pound weight gain in the last 3 to 4 weeks and admitted for acute diastolic CHF  Clinical Impression  Pt admitted with above diagnosis. Pt was able to ambulate in room and had ambulated in halls with nursing with nursing doing an ambulation with pulse ox reading note.  Pt needed education on how to adjust her rollator.  She was appreciative. Would also benefit from HHPT. O2 is ordered and to be delivered prior to d/c. Pt encouraged to wear O2 with activity. Will not follow.   Follow Up Recommendations Home health PT    Equipment Recommendations  (home O2)    Recommendations for Other Services       Precautions / Restrictions Precautions Precautions: None Restrictions Weight Bearing Restrictions: No      Mobility  Bed Mobility Overal bed mobility: Independent                Transfers Overall transfer level: Independent                  Ambulation/Gait Ambulation/Gait assistance: Independent Gait Distance (Feet): 50 Feet Assistive device: None       General Gait Details: Pt ambulating in room without device with good stability.   Stairs            Wheelchair Mobility    Modified Rankin (Stroke Patients Only)       Balance                                             Pertinent Vitals/Pain Pain Assessment: No/denies pain    Home Living Family/patient expects to be discharged to:: Private residence Living Arrangements: Non-relatives/Friends Available Help at Discharge: Family;Available PRN/intermittently Type of Home: House Home Access: Stairs to enter;Ramped  entrance   Entrance Stairs-Number of Steps: 1 Home Layout: One level Home Equipment: Walker - 4 wheels(pulse oximeter) Additional Comments: rents a room    Prior Function Level of Independence: Independent               Hand Dominance   Dominant Hand: Right    Extremity/Trunk Assessment   Upper Extremity Assessment Upper Extremity Assessment: Defer to OT evaluation    Lower Extremity Assessment Lower Extremity Assessment: Generalized weakness    Cervical / Trunk Assessment Cervical / Trunk Assessment: Normal  Communication   Communication: No difficulties  Cognition Arousal/Alertness: Awake/alert Behavior During Therapy: WFL for tasks assessed/performed Overall Cognitive Status: Within Functional Limits for tasks assessed                                        General Comments General comments (skin integrity, edema, etc.): Reviewed with pt how to adjust the rollator and pt appreciative. She says hers that she jsut got is too short.     Exercises     Assessment/Plan    PT Assessment Patent does not need any further PT services  PT Problem List  PT Treatment Interventions      PT Goals (Current goals can be found in the Care Plan section)  Acute Rehab PT Goals Patient Stated Goal: to go home today PT Goal Formulation: All assessment and education complete, DC therapy    Frequency     Barriers to discharge        Co-evaluation               AM-PAC PT "6 Clicks" Mobility  Outcome Measure Help needed turning from your back to your side while in a flat bed without using bedrails?: None Help needed moving from lying on your back to sitting on the side of a flat bed without using bedrails?: None Help needed moving to and from a bed to a chair (including a wheelchair)?: None Help needed standing up from a chair using your arms (e.g., wheelchair or bedside chair)?: None Help needed to walk in hospital room?: None Help  needed climbing 3-5 steps with a railing? : None 6 Click Score: 24    End of Session Equipment Utilized During Treatment: Gait belt;Oxygen Activity Tolerance: Patient limited by fatigue Patient left: in chair;with call bell/phone within reach;with chair alarm set Nurse Communication: Mobility status PT Visit Diagnosis: Muscle weakness (generalized) (M62.81)    Time: 4314-2767 PT Time Calculation (min) (ACUTE ONLY): 16 min   Charges:   PT Evaluation $PT Eval Low Complexity: [place] W,PT Acute Rehabilitation Services Pager:  [phone]  Office:  [place] 04/20/2020, 1:32 PM

## 2020-04-20 NOTE — Progress Notes (Signed)
SATURATION QUALIFICATIONS: (This note is used to comply with regulatory documentation for home oxygen)  Patient Saturations on Room Air at Rest = 90%  Patient Saturations on Room Air while Ambulating = 87%  Patient Saturations on 2 Liters of oxygen while Ambulating = 97%  Please briefly explain why patient needs home oxygen:

## 2020-04-20 NOTE — Discharge Summary (Signed)
Physician Discharge Summary  [name] BHA:[id number] DOB: 09-28-1956 DOA: 04/16/2020  PCP: Flossie Buffy, NP  Admit date: 04/16/2020 Discharge date: 04/20/2020  Admitted From: Home Disposition: Home  Recommendations for Outpatient Follow-up:  1. Follow ups as below. 2. Please obtain CBC/BMP/Mag at follow up 3. Please follow up on the following pending results: None  Home Health: RN Equipment/Devices: Portable oxygen and walker  Discharge Condition: Stable CODE STATUS: Full code  Follow-up Information    [facility] Follow up.   Why: [facility] Contact information: [place] [address] [phone]        Flossie Buffy, NP. Schedule an appointment as soon as possible for a visit in 4 day(s).   Specialty: Internal Medicine Contact information: [place] [address] [phone]        Fay Records, MD. Schedule an appointment as soon as possible for a visit in 2 week(s).   Specialty: Cardiology Contact information: [place] [address] [phone]            Hospital Course: 64 year old female with history of combined CHF, pHTN, COPD not on oxygen, CVA, paroxysmal SVT, HTN and obesity presenting with progressive shortness of breath, DOE, orthopnea, PND, BLE edema and about 15 pound weight gain in the last 3 to 4 weeks and admitted for acute diastolic CHF.  In ED, BNP 1641.  CXR with cardiomegaly and bilateral pleural effusions.  Started on IV Lasix and admitted. Patient diuresed well with IV Lasix with improvement in his symptoms.  However, she developed AKI.  Diuretics held.  Received gentle IV fluid.  Creatinine rising slowed down.  Although we wanted to watch her 1 more day to see the creatinine trending down, patient wanted to go home and follow-up with a primary care doctor and cardiologist. She says she has to look after her late sister's dog.   She was ambulated on room air and desaturated to 87% requiring 2 L to recover.  So discharge on 2 L by nasal cannula to be used with ambulation.  Patient was also encouraged to use his CPAP at night.  She will resume her home torsemide 20 mg daily in 2 days.  She will follow up with a primary care doctor early next week, and cardiologist in 1 to 2 weeks.  Home health RN ordered for help with CHF.  See individual problem list below for more hospital course.  Discharge Diagnoses:  Acute respiratory failure with hypoxia: Multifactorial including CHF, OSA and possible OHS.  Not compliant with CPAP.  Ambulated and desaturated to 87% on room air.   -Discharged on 2 L by [place] to be used with ambulation.   -Encouraged to use CPAP.  Discussed the importance of this. -Manage CHF as below  Acute on chronic combined CHF: Echo in 12/2019 with EF of 45 to 50%,G2DD.  Presented with cardinal symptoms.  BNP elevated to 1600.  CXR concerning for CHF.  Diuresed with IV Lasix 40 mg with improvement in his symptoms but developed AKI.  Diuretics discontinued.  Received gentle IV fluid.  Creatinine rising slow down. -Patient to hold home torsemide for 2 days -Entresto discontinued on discharge.  -She will continue Imdur and hydralazine. -HH RN for CHF management -Patient is also on verapamil for paroxysmal SVT which is not ideal with CHF. -Patient will follow up with PCP and cardiology next week.  Paroxysmal SVT/Prolonged QTc: Rate controlled  Scheduled  for SVT ablation on 05/21/2020. -Continue home Cardizem CD although this is not ideal in the setting of a CHF.  Mildly elevated troponin: Likely demand ischemia.  Non-ACS pattern.  No chest pain. -Manage CHF as above  AKI on CKD-3D/azotemia: b/d Cr 2.0> 1.97 (admit)> 2.40> 3.18>3.33 -Repeat BMP early next week -Advised to hold diuretics for 2 more days -Discontinued Entresto on discharge  Controlled DM-2: A1c 6.3%.  CBG was in fair range.  On insulin and  Jardiance at home. Recent Labs    04/19/20 1614 04/19/20 2037 04/20/20 0624  GLUCAP 141* 148* 116*  -Discharged on home medications.  Essential HTN: Normotensive. -Cardiac meds as above  Morbid obesity: Body mass index is 43.7 kg/m. -Encourage lifestyle change to lose weight. -Could benefit from GLP-1 inhibitors.  Anxiety/depression: Stable -Continue home medications  Right lower extremity swelling/edema: LLE Doppler negative for DVT.  Constipation -MiraLAX and Senokot-S twice daily as needed                  Discharge Exam: Vitals:   04/19/20 2300 04/20/20 0633  BP:  (!) 148/58  Pulse: 62 66  Resp: 16 18  Temp:  98.3 F (36.8 C)  SpO2: 94% 97%    GENERAL: No apparent distress.  Nontoxic. HEENT: MMM.  Vision and hearing grossly intact.  NECK: Supple.  No apparent JVD but difficult exam due to body habitus.  RESP: On room air at rest.  No IWOB.  Fair aeration bilaterally. CVS:  RRR. Heart sounds normal.  ABD/GI/GU: Bowel sounds present. Soft. Non tender.  MSK/EXT:  Moves extremities. No apparent deformity.  Trace edema bilaterally. SKIN: no apparent skin lesion or wound NEURO: Awake, alert and oriented appropriately.  No apparent focal neuro deficit. PSYCH: Calm. Normal affect.   Discharge Instructions  Discharge Instructions    (HEART FAILURE PATIENTS) Call MD:  Anytime you have any of the following symptoms: 1) 3 pound weight gain in 24 hours or 5 pounds in 1 week 2) shortness of breath, with or without a dry hacking cough 3) swelling in the hands, feet or stomach 4) if you have to sleep on extra pillows at night in order to breathe.   Complete by: As directed    Call MD for:  difficulty breathing, headache or visual disturbances   Complete by: As directed    Call MD for:  extreme fatigue   Complete by: As directed    Call MD for:  persistant dizziness or light-headedness   Complete by: As directed    Call MD for:  persistant nausea and  vomiting   Complete by: As directed    Diet - low sodium heart healthy   Complete by: As directed    Diet Carb Modified   Complete by: As directed    Discharge instructions   Complete by: As directed    It has been a pleasure taking care of you! You were hospitalized with shortness of breath, swelling and weight gain likely due to CHF exacerbation.  You were treated with IV Lasix (furosemide) with improvement in your symptoms.  Unfortunately, your creatinine (your kidney number) went up a little bit and we had to stop diuretics.  You may resume your torsemide in 2 to 3 days as needed for swelling, shortness of breath or significant weight gain. Please review your new medication list and the directions before you take your medications.  In addition to taking your medications as prescribed, we also recommend avoiding alcohol or over-the-counter pain  medication other than plain Tylenol, limiting the amount of water/fluid you drink to less than 6 cups (1500 cc) a day,  limiting your sodium (salt) intake to less than 2 g (2000 mg) a day, and weighing yourself daily at the same time and keeping your weight log.  Please follow up with your PCP early next week to have your kidney numbers checked. We also recommend follow up with your cardiologist in one to two weeks   Take care,   Increase activity slowly   Complete by: As directed      Allergies as of 04/20/2020      Reactions   Atorvastatin Other (See Comments), Nausea And Vomiting   "legs cramped; moderate to almost severe" "legs cramped; moderate to almost severe"   Ibuprofen Other (See Comments)   Other reaction(s): Other (See Comments) Per pt, MD doesn't want pt to take Per pt, MD doesn't want pt to take   Metformin And Related Nausea And Vomiting   Ace Inhibitors Cough, Nausea And Vomiting   Hydrocodone-acetaminophen Nausea And Vomiting   Albuterol Other (See Comments)   Caused SVT, but pt is currently taking it.       Medication  List    STOP taking these medications   doxycycline 100 MG tablet Commonly known as: VIBRA-TABS   Entresto 49-51 MG Generic drug: sacubitril-valsartan   varenicline 0.5 MG tablet Commonly known as: CHANTIX     TAKE these medications   acetaminophen 325 MG tablet Commonly known as: TYLENOL Take 2 tablets (650 mg total) by mouth every 6 (six) hours as needed for mild pain.   albuterol 108 (90 Base) MCG/ACT inhaler Commonly known as: VENTOLIN HFA TAKE 2 PUFFS BY MOUTH EVERY 6 HOURS AS NEEDED FOR WHEEZE OR SHORTNESS OF BREATH What changed: See the new instructions.   allopurinol 100 MG tablet Commonly known as: ZYLOPRIM Take 100 mg by mouth daily.   aspirin EC 81 MG tablet Take 1 tablet (81 mg total) by mouth daily.   benzonatate 100 MG capsule Commonly known as: TESSALON Take 1 capsule (100 mg total) by mouth every 8 (eight) hours.   busPIRone 15 MG tablet Commonly known as: BUSPAR Take 15 mg by mouth 2 (two) times daily.   empagliflozin 10 MG Tabs tablet Commonly known as: JARDIANCE Take 10 mg by mouth daily before breakfast.   fenofibrate 54 MG tablet Take 1 tablet (54 mg total) by mouth daily.   FLUoxetine 20 MG capsule Commonly known as: PROZAC Take 1 capsule (20 mg total) by mouth at bedtime.   gabapentin 400 MG capsule Commonly known as: NEURONTIN Take 1 capsule (400 mg total) by mouth at bedtime.   hydrALAZINE 50 MG tablet Commonly known as: APRESOLINE Take 1 tablet (50 mg total) by mouth every 8 (eight) hours.   isosorbide mononitrate 60 MG 24 hr tablet Commonly known as: IMDUR TAKE 1 TABLET (60 MG TOTAL) BY MOUTH DAILY FOR 30 DAYS. ** DO NOT CRUSH ** What changed: See the new instructions.   nitroGLYCERIN 0.4 MG SL tablet Commonly known as: NITROSTAT Place 1 tablet (0.4 mg total) under the tongue every 5 (five) minutes as needed. What changed: reasons to take this   NovoLOG FlexPen 100 UNIT/ML FlexPen Generic drug: insulin aspart Inject 5  Units into the skin 3 (three) times daily with meals. Dx: E11.9   omeprazole 40 MG capsule Commonly known as: PRILOSEC TAKE 1 CAPSULE BY MOUTH 30- 60 MIN BEFORE YOUR FIRST AND LAST MEALS OF THE  DAY What changed:   how much to take  how to take this  when to take this   OneTouch Verio test strip Generic drug: glucose blood For E11.22.  Use to check glucose qid What changed:   how much to take  how to take this  when to take this  additional instructions   OneTouch Verio w/Device Kit USE TO CHECK BLOOD GLUCOSE 4 TIMES A DAY . DIAGNOSIS: TYPE 2 DIABETES E11.59 What changed:   how much to take  how to take this  when to take this   rosuvastatin 20 MG tablet Commonly known as: Crestor Take 1 tablet (20 mg total) by mouth daily.   topiramate 25 MG tablet Commonly known as: TOPAMAX TAKE 1 TABLET BY MOUTH TWICE A DAY   torsemide 20 MG tablet Commonly known as: DEMADEX Take 1 tablet (20 mg total) by mouth daily as needed (fluid retention.). Start taking on: Apr 22, 2020 What changed:   reasons to take this  These instructions start on Apr 22, 2020. If you are unsure what to do until then, ask your doctor or other care provider.   Verapamil HCl CR 300 MG Cp24 TAKE 1 CAPSULE (300 MG TOTAL) BY MOUTH DAILY. PLEASE CALL TO SCHEDULE OVERDUE APPT WITH DR ROSS What changed: See the new instructions.            Durable Medical Equipment  (From admission, onward)         Start     Ordered   04/20/20 0914  For home use only DME oxygen  Once    Comments: Desaturated to 87% with ambulation on room air and required 2L 2L with ambulation  Question Answer Comment  Length of Need 6 Months   Mode or (Route) Nasal cannula   Liters per Minute 2   Frequency Continuous (stationary and portable oxygen unit needed)   Oxygen conserving device Yes   Oxygen delivery system Gas      04/20/20 0914   04/19/20 0855  For home use only DME Walker rolling  Once    Question  Answer Comment  Walker: With [place]   Patient needs a walker to treat with the following condition Weakness      04/19/20 0854          Consultations:  None  Procedures/Studies:   DG Chest 2 View  Result Date: 04/16/2020 CLINICAL DATA:  Shortness of breath EXAM: CHEST - 2 VIEW COMPARISON:  08/20/2019 FINDINGS: Stable cardiomegaly. Atherosclerotic calcification of the aortic knob. No focal airspace consolidation, pleural effusion, or pneumothorax. No acute osseous findings. IMPRESSION: No active cardiopulmonary disease. Electronically Signed   By: Davina Poke D.O.   On: 04/16/2020 13:45   VAS [place] LOWER EXTREMITY VENOUS (DVT)  Result Date: 04/19/2020  Lower Venous DVTStudy Indications: Edema.  Comparison Study: No prior study. Performing Technologist: Maudry Mayhew MHA, RDMS, RVT, RDCS  Examination Guidelines: A complete evaluation includes B-mode imaging, spectral Doppler, color Doppler, and power Doppler as needed of all accessible portions of each vessel. Bilateral testing is considered an integral part of a complete examination. Limited examinations for reoccurring indications may be performed as noted. The reflux portion of the exam is performed with the patient in reverse Trendelenburg.  +---------+---------------+---------+-----------+----------+--------------+ RIGHT    CompressibilityPhasicitySpontaneityPropertiesThrombus Aging +---------+---------------+---------+-----------+----------+--------------+ CFV      Full           No       Yes                                 +---------+---------------+---------+-----------+----------+--------------+  SFJ      Full                                                        +---------+---------------+---------+-----------+----------+--------------+ FV Prox  Full                                                        +---------+---------------+---------+-----------+----------+--------------+ FV Mid   Full                                                         +---------+---------------+---------+-----------+----------+--------------+ FV DistalFull                                                        +---------+---------------+---------+-----------+----------+--------------+ PFV      Full                                                        +---------+---------------+---------+-----------+----------+--------------+ POP      Full           No       Yes                                 +---------+---------------+---------+-----------+----------+--------------+ PTV      Full                                                        +---------+---------------+---------+-----------+----------+--------------+ PERO     Full                                                        +---------+---------------+---------+-----------+----------+--------------+   +---------+---------------+---------+-----------+----------+--------------+ LEFT     CompressibilityPhasicitySpontaneityPropertiesThrombus Aging +---------+---------------+---------+-----------+----------+--------------+ CFV      Full           No       Yes                                 +---------+---------------+---------+-----------+----------+--------------+ SFJ      Full                                                        +---------+---------------+---------+-----------+----------+--------------+  FV Prox  Full                                                        +---------+---------------+---------+-----------+----------+--------------+ FV Mid   Full                                                        +---------+---------------+---------+-----------+----------+--------------+ FV DistalFull                                                        +---------+---------------+---------+-----------+----------+--------------+ PFV      Full                                                         +---------+---------------+---------+-----------+----------+--------------+ POP      Full           No       Yes                                 +---------+---------------+---------+-----------+----------+--------------+ PTV      Full                                                        +---------+---------------+---------+-----------+----------+--------------+ PERO     Full                                                        +---------+---------------+---------+-----------+----------+--------------+     Summary: RIGHT: - There is no evidence of deep vein thrombosis in the lower extremity.  - No cystic structure found in the popliteal fossa.  LEFT: - There is no evidence of deep vein thrombosis in the lower extremity.  - No cystic structure found in the popliteal fossa.  Bilateral lower extremity venous flow is pulsatile, suggestive of possible elevated right heart pressure.  *See table(s) above for measurements and observations. Electronically signed by Servando Snare MD on 04/19/2020 at 5:39:59 PM.    Final         The results of significant diagnostics from this hospitalization (including imaging, microbiology, ancillary and laboratory) are listed below for reference.     Microbiology: Recent Results (from the past 240 hour(s))  SARS Coronavirus 2 by RT PCR (hospital order, performed in [facility] hospital lab) Nasopharyngeal Nasopharyngeal Swab     Status: None   Collection Time: 04/17/20 12:07 AM   Specimen: Nasopharyngeal Swab  Result Value Ref Range Status  SARS Coronavirus 2 NEGATIVE NEGATIVE Final    Comment: (NOTE) SARS-CoV-2 target nucleic acids are NOT DETECTED. The SARS-CoV-2 RNA is generally detectable in upper and lower respiratory specimens during the acute phase of infection. The lowest concentration of SARS-CoV-2 viral copies this assay can detect is 250 copies / mL. A negative result does not preclude SARS-CoV-2 infection and should not  be used as the sole basis for treatment or other patient management decisions.  A negative result may occur with improper specimen collection / handling, submission of specimen other than nasopharyngeal swab, presence of viral mutation(s) within the areas targeted by this assay, and inadequate number of viral copies (<250 copies / mL). A negative result must be combined with clinical observations, patient history, and epidemiological information. Fact Sheet for Patients:   StrictlyIdeas.no Fact Sheet for Healthcare Providers: BankingDealers.co.za This test is not yet approved or cleared  by the [place] FDA and has been authorized for detection and/or diagnosis of SARS-CoV-2 by FDA under an Emergency Use Authorization (EUA).  This EUA will remain in effect (meaning this test can be used) for the duration of the COVID-19 declaration under Section 564(b)(1) of the Act, 21 U.S.C. section 360bbb-3(b)(1), unless the authorization is terminated or revoked sooner. Performed at [facility], [place] [address] [place], [place] [address]      Labs: BNP (last 3 results) Recent Labs    11/15/19 1250 11/29/19 1100 04/16/20 2301  BNP 486.7* 660.1* 9,021.1*   Basic Metabolic Panel: Recent Labs  Lab 04/16/20 1304 04/17/20 0342 04/18/20 0820 04/19/20 0623 04/20/20 0646  NA 144 144 140 140 141  K 4.8 3.6 3.8 4.2 4.2  CL 109 108 102 104 106  CO2 _0 GLUCOSE 142* 174* 130* 101* 119*  BUN 25* 26* 30* 39* 44*  CREATININE 1.97* 2.07* 2.40* 3.18* 3.33*  CALCIUM 8.9 8.6* 8.5* 8.0* 8.0*  MG  --  2.1 1.8 2.0 2.1  PHOS  --   --   --  5.5* 5.2*   Liver Function Tests: Recent Labs  Lab 04/17/20 0342 04/19/20 0623 04/20/20 0646  AST 18  --   --   ALT 11  --   --   ALKPHOS 85  --   --   BILITOT 0.9  --   --   PROT 6.7  --   --   ALBUMIN 3.5 3.5 3.3*   No results for input(s): LIPASE, AMYLASE in the last 168 hours. No  results for input(s): AMMONIA in the last 168 hours. CBC: Recent Labs  Lab 04/16/20 1304 04/17/20 0342  WBC 5.5 6.8  NEUTROABS  --  4.6  HGB 13.1 13.3  HCT 43.9 45.1  MCV 95.9 96.2  PLT 168 175   Cardiac Enzymes: No results for input(s): CKTOTAL, CKMB, CKMBINDEX, TROPONINI in the last 168 hours. BNP: Invalid input(s): POCBNP CBG: Recent Labs  Lab 04/19/20 0625 04/19/20 1121 04/19/20 1614 04/19/20 2037 04/20/20 0624  GLUCAP 102* 108* 141* 148* 116*   D-Dimer No results for input(s): DDIMER in the last 72 hours. Hgb A1c No results for input(s): HGBA1C in the last 72 hours. Lipid Profile No results for input(s): CHOL, HDL, LDLCALC, TRIG, CHOLHDL, LDLDIRECT in the last 72 hours. Thyroid function studies No results for input(s): TSH, T4TOTAL, T3FREE, THYROIDAB in the last 72 hours.  Invalid input(s): FREET3 Anemia work up No results for input(s): VITAMINB12, FOLATE, FERRITIN, TIBC, IRON, RETICCTPCT in the last 72 hours. Urinalysis  Component Value Date/Time   COLORURINE AMBER (A) 08/18/2019 2150   APPEARANCEUR CLOUDY (A) 08/18/2019 2150   LABSPEC 1.017 08/18/2019 2150   PHURINE 5.0 08/18/2019 2150   GLUCOSEU NEGATIVE 08/18/2019 2150   HGBUR MODERATE (A) 08/18/2019 2150   BILIRUBINUR NEGATIVE 08/18/2019 2150   BILIRUBINUR 1+ 02/07/2019 1433   KETONESUR NEGATIVE 08/18/2019 2150   PROTEINUR >=300 (A) 08/18/2019 2150   UROBILINOGEN 0.2 02/07/2019 1433   UROBILINOGEN 0.2 07/07/2013 1353   NITRITE POSITIVE (A) 08/18/2019 2150   LEUKOCYTESUR LARGE (A) 08/18/2019 2150   Sepsis Labs Invalid input(s): PROCALCITONIN,  WBC,  LACTICIDVEN   Time coordinating discharge: 40 minutes  SIGNED:  Mercy Riding, MD  [facility] 04/20/2020, 9:23 AM  If 7PM-7AM, please contact night-coverage [email or web address] Password [email or web address]

## 2020-04-20 NOTE — Care Management Important Message (Signed)
Important Message  Patient Details  Name: [name] MRN: [id number] Date of Birth: 27-Nov-1956   Medicare Important Message Given:  Yes     Shelda Altes 04/20/2020, 9:52 AM

## 2020-04-20 NOTE — TOC Transition Note (Signed)
Transition of Care [facility]) - CM/SW Discharge Note   Patient Details  Name: [name] MRN: [id number] Date of Birth: 1956/09/19  Transition of Care [facility]) CM/SW Contact:  Verdell Carmine, RN Phone Number: 04/20/2020, 10:27 AM   Clinical Narrative:    Qualified for oxygen, chose [facility] as she has CPAP at home with [facility]. Set up  And patient and nurse notified.  Patient will discharge today with [facility] and [facility]. No further needs identified. .    Final next level of care: [place] Barriers to Discharge: No Barriers Identified   Patient Goals and CMS Choice Patient states their goals for this hospitalization and ongoing recovery are:: get better CMS Medicare.gov Compare Post Acute Care list provided to:: Patient Choice offered to / list presented to : Patient  Discharge Placement                       Discharge Plan and Services In-house Referral: NA Discharge Planning Services: CM Consult Post Acute Care Choice: Home Health          DME Arranged: Oxygen DME Agency: [facility] Date DME Agency Contacted: 04/19/20 Time DME Agency Contacted: [phone] Representative spoke with at DME Agency: Caryl Pina HH Arranged: RN, Disease Management [place] Agency: [place] Date [place]: 04/19/20 Time [place]: 0915 Representative spoke with at [place]: [place] ([place]) Interventions     Readmission Risk Interventions Readmission Risk Prevention Plan 04/19/2020  Transportation Screening Complete  PCP or Specialist Appt within 3-5 Days Complete  HRI or [place] Complete  Social Work Consult for [place] Planning/Counseling Complete  Palliative Care Screening Not Applicable  Medication Review Press photographer) Complete  Some recent data might be hidden

## 2020-04-23 ENCOUNTER — Telehealth: Payer: Self-pay | Admitting: *Deleted

## 2020-04-23 NOTE — Telephone Encounter (Signed)
1st attempt. Unable to reach patient.   

## 2020-04-24 ENCOUNTER — Ambulatory Visit (INDEPENDENT_AMBULATORY_CARE_PROVIDER_SITE_OTHER): Payer: Medicare Other | Admitting: Nurse Practitioner

## 2020-04-24 ENCOUNTER — Other Ambulatory Visit: Payer: Self-pay

## 2020-04-24 ENCOUNTER — Encounter: Payer: Self-pay | Admitting: Nurse Practitioner

## 2020-04-24 VITALS — BP 138/82 | HR 77 | Temp 96.4°F | Ht 62.0 in | Wt 242.4 lb

## 2020-04-24 DIAGNOSIS — E1142 Type 2 diabetes mellitus with diabetic polyneuropathy: Secondary | ICD-10-CM

## 2020-04-24 DIAGNOSIS — N184 Chronic kidney disease, stage 4 (severe): Secondary | ICD-10-CM

## 2020-04-24 DIAGNOSIS — Z794 Long term (current) use of insulin: Secondary | ICD-10-CM | POA: Diagnosis not present

## 2020-04-24 DIAGNOSIS — K219 Gastro-esophageal reflux disease without esophagitis: Secondary | ICD-10-CM | POA: Diagnosis not present

## 2020-04-24 LAB — RENAL FUNCTION PANEL
Albumin: 4.2 g/dL (ref 3.5–5.2)
BUN: 35 mg/dL — ABNORMAL HIGH (ref 6–23)
CO2: 30 mEq/L (ref 19–32)
Calcium: 8.6 mg/dL (ref 8.4–10.5)
Chloride: 103 mEq/L (ref 96–112)
Creatinine, Ser: 1.78 mg/dL — ABNORMAL HIGH (ref 0.40–1.20)
GFR: 28.79 mL/min — ABNORMAL LOW (ref >60.00)
Glucose, Bld: 121 mg/dL — ABNORMAL HIGH (ref 70–99)
Phosphorus: 3.4 mg/dL (ref 2.3–4.6)
Potassium: 4.5 mEq/L (ref 3.5–5.1)
Sodium: 140 mEq/L (ref 135–145)

## 2020-04-24 LAB — MAGNESIUM: Magnesium: 2 mg/dL (ref 1.5–2.5)

## 2020-04-24 LAB — CBC
HCT: 38.8 % (ref 36.0–46.0)
Hemoglobin: 12.7 g/dL (ref 12.0–15.0)
MCHC: 32.7 g/dL (ref 30.0–36.0)
MCV: 90 fl (ref 78.0–100.0)
Platelets: 170 10*3/uL (ref 150.0–400.0)
RBC: 4.31 Mil/uL (ref 3.87–5.11)
RDW: 16.6 % — ABNORMAL HIGH (ref 11.5–15.5)
WBC: 5.6 10*3/uL (ref 4.0–10.5)

## 2020-04-24 MED ORDER — GABAPENTIN 400 MG PO CAPS: 400.0000 mg | ORAL_CAPSULE | Freq: Every day | ORAL | 3 refills | Status: AC

## 2020-04-24 MED ORDER — OMEPRAZOLE 20 MG PO CPDR: 20.0000 mg | DELAYED_RELEASE_CAPSULE | Freq: Every day | ORAL | 1 refills | Status: AC

## 2020-04-24 NOTE — Patient Instructions (Addendum)
Schedule appt with nephrology Maintain upcoming appt with cardiology Call home health agency for oxygen tank today.  Improved cbc, magnesium and renal function. Creatinine decreased from 3.33 to 1.78, normal phosphorus, and BUN 44 to 35. You are at stage 4 CKD. Schedule appt with nephrologist as discussed Maintain appt with cardiology  I decreased omeprazole to 20mg  daily due to CKD Gabapentin refilled Need to check glucose before meals Do not take insulin if glucose <150.

## 2020-04-24 NOTE — Progress Notes (Signed)
Subjective:  Patient ID: [name], female    DOB: 07/24/1956  Age: 64 y.o. MRN: [id number]  CC: Hospitalization Follow-up (hospital for SOB//pt is not feeling any better//had cataract surgery in April//needs colonoscopy//refill gabapentin)  HPI Hospitalization: 05/11 to 05/14 TCM completed 04/23/2020 [name] was admitted due to CHF exacerbation and acute kidney injury. He hospitalization was complicated by hypoxia, so [name] was discharged home with supplemental oxygen at 2L via nasal cannula. I reviewed discharge summary, lab results and radiology report. Today, [name] reports SOB with minimal exertion and PND. [name] reports [name] ran out of oxygen at home. [name] contact home health agency for replacement, but could not coordinate an adequate time for tanks to be delivered. No weight gain noted today. [name] has upcoming appt with cardiology, but has nor schedule appt with nephrology. [name] uses novolog insulin TID with meals, but does not check glucose. [name] denies any hypoglycemic symptoms. [name] also takes jardiance. BP Readings from Last 3 Encounters:  04/24/20 138/82  04/20/20 (!) 148/58  04/11/20 140/84   Wt Readings from Last 3 Encounters:  04/24/20 242 lb 6.4 oz (110 kg)  04/20/20 241 lb 1.6 oz (109.4 kg)  04/11/20 236 lb 9.6 oz (107.3 kg)   Reviewed past Medical, Social and Family history today.  Outpatient Medications Prior to Visit  Medication Sig Dispense Refill  . acetaminophen (TYLENOL) 325 MG tablet Take 2 tablets (650 mg total) by mouth every 6 (six) hours as needed for mild pain. 90 tablet 2  . albuterol (VENTOLIN HFA) 108 (90 Base) MCG/ACT inhaler TAKE 2 PUFFS BY MOUTH EVERY 6 HOURS AS NEEDED FOR WHEEZE OR SHORTNESS OF BREATH (Patient taking differently: Inhale 2 puffs into the lungs every 6 (six) hours as needed for wheezing or shortness of breath. ) 18 g 1  . allopurinol (ZYLOPRIM) 100 MG tablet Take 100 mg by mouth daily.     Marland Kitchen aspirin EC 81 MG tablet Take 1  tablet (81 mg total) by mouth daily. 90 tablet 3  . benzonatate (TESSALON) 100 MG capsule Take 1 capsule (100 mg total) by mouth every 8 (eight) hours. 21 capsule 0  . Blood Glucose Monitoring Suppl (ONETOUCH VERIO) w/Device KIT USE TO CHECK BLOOD GLUCOSE 4 TIMES A DAY . DIAGNOSIS: TYPE 2 DIABETES E11.59 (Patient taking differently: 1 each by Other route See admin instructions. USE TO CHECK BLOOD GLUCOSE 4 TIMES A DAY . DIAGNOSIS: TYPE 2 DIABETES E11.59) 1 kit 0  . busPIRone (BUSPAR) 15 MG tablet Take 15 mg by mouth 2 (two) times daily.     . empagliflozin (JARDIANCE) 10 MG TABS tablet Take 10 mg by mouth daily before breakfast. 30 tablet 11  . fenofibrate 54 MG tablet Take 1 tablet (54 mg total) by mouth daily. 90 tablet 3  . FLUoxetine (PROZAC) 20 MG capsule Take 1 capsule (20 mg total) by mouth at bedtime. 90 capsule 3  . glucose blood (ONETOUCH VERIO) test strip For E11.22.  Use to check glucose qid (Patient taking differently: 1 each by Other route See admin instructions. For E11.22.  Use to check glucose four times daily) 100 strip 12  . hydrALAZINE (APRESOLINE) 50 MG tablet Take 1 tablet (50 mg total) by mouth every 8 (eight) hours. 270 tablet 3  . insulin aspart (NOVOLOG FLEXPEN) 100 UNIT/ML FlexPen Inject 5 Units into the skin 3 (three) times daily with meals. Dx: E11.9 15 mL   . isosorbide mononitrate (IMDUR) 60 MG 24 hr tablet TAKE 1 TABLET (60  MG TOTAL) BY MOUTH DAILY FOR 30 DAYS. ** DO NOT CRUSH ** (Patient taking differently: Take 60 mg by mouth daily. ) 90 tablet 0  . nitroGLYCERIN (NITROSTAT) 0.4 MG SL tablet Place 1 tablet (0.4 mg total) under the tongue every 5 (five) minutes as needed. (Patient taking differently: Place 0.4 mg under the tongue every 5 (five) minutes as needed for chest pain. ) 25 tablet 3  . rosuvastatin (CRESTOR) 20 MG tablet Take 1 tablet (20 mg total) by mouth daily. 90 tablet 2  . topiramate (TOPAMAX) 25 MG tablet TAKE 1 TABLET BY MOUTH TWICE A DAY (Patient taking  differently: Take 25 mg by mouth 2 (two) times daily. ) 180 tablet 1  . torsemide (DEMADEX) 20 MG tablet Take 1 tablet (20 mg total) by mouth daily as needed (fluid retention.).    Marland Kitchen Verapamil HCl CR 300 MG CP24 TAKE 1 CAPSULE (300 MG TOTAL) BY MOUTH DAILY. PLEASE CALL TO SCHEDULE OVERDUE APPT WITH DR ROSS (Patient taking differently: Take 300 mg by mouth daily. ) 90 capsule 1  . gabapentin (NEURONTIN) 400 MG capsule Take 1 capsule (400 mg total) by mouth at bedtime.    Marland Kitchen omeprazole (PRILOSEC) 40 MG capsule TAKE 1 CAPSULE BY MOUTH 30- 60 MIN BEFORE YOUR FIRST AND LAST MEALS OF THE DAY (Patient taking differently: Take 40 mg by mouth See admin instructions. TAKE 1 CAPSULE BY MOUTH 30- 60 MIN BEFORE YOUR FIRST AND LAST MEALS OF THE DAY) 180 capsule 0   No facility-administered medications prior to visit.    ROS See HPI  Objective:  BP 138/82   Pulse 77   Temp (!) 96.4 F (35.8 C) (Tympanic)   Ht _0  (1.575 m)   Wt 242 lb 6.4 oz (110 kg)   SpO2 93%   BMI 44.34 kg/m   BP Readings from Last 3 Encounters:  04/24/20 138/82  04/20/20 (!) 148/58  04/11/20 140/84    Wt Readings from Last 3 Encounters:  04/24/20 242 lb 6.4 oz (110 kg)  04/20/20 241 lb 1.6 oz (109.4 kg)  04/11/20 236 lb 9.6 oz (107.3 kg)    Physical Exam Vitals reviewed.  Cardiovascular:     Rate and Rhythm: Normal rate.     Pulses: Normal pulses.  Pulmonary:     Effort: Pulmonary effort is normal. No respiratory distress.     Breath sounds: Rales present.  Musculoskeletal:     Right lower leg: Edema present.     Left lower leg: Edema present.  Neurological:     Mental Status: [name] is alert and oriented to person, place, and time.    Lab Results  Component Value Date   WBC 5.6 04/24/2020   HGB 12.7 04/24/2020   HCT 38.8 04/24/2020   PLT 170.0 04/24/2020   GLUCOSE 121 (H) 04/24/2020   CHOL 220 (H) 04/11/2020   TRIG 209.0 (H) 04/11/2020   HDL 55.40 04/11/2020   LDLDIRECT 121.0 04/11/2020   LDLCALC 68  05/23/2019   ALT 11 04/17/2020   AST 18 04/17/2020   NA 140 04/24/2020   K 4.5 04/24/2020   CL 103 04/24/2020   CREATININE 1.78 (H) 04/24/2020   BUN 35 (H) 04/24/2020   CO2 30 04/24/2020   TSH 4.742 (H) 08/17/2019   INR 1.0 07/27/2017   HGBA1C 6.3 (H) 04/16/2020   MICROALBUR 290.5 (H) 04/11/2020    DG Chest 2 View  Result Date: 04/16/2020 CLINICAL DATA:  Shortness of breath EXAM: CHEST - 2 VIEW  COMPARISON:  08/20/2019 FINDINGS: Stable cardiomegaly. Atherosclerotic calcification of the aortic knob. No focal airspace consolidation, pleural effusion, or pneumothorax. No acute osseous findings. IMPRESSION: No active cardiopulmonary disease. Electronically Signed   By: Davina Poke D.O.   On: 04/16/2020 13:45    Assessment & Plan:  This visit occurred during the SARS-CoV-2 public health emergency.  Safety protocols were in place, including screening questions prior to the visit, additional usage of staff PPE, and extensive cleaning of exam room while observing appropriate contact time as indicated for disinfecting solutions.   [name] was seen today for hospitalization follow-up.  Diagnoses and all orders for this visit:  Type 2 diabetes mellitus with diabetic polyneuropathy, with long-term current use of insulin (HCC) -     gabapentin (NEURONTIN) 400 MG capsule; Take 1 capsule (400 mg total) by mouth at bedtime.  CKD (chronic kidney disease) stage 4, GFR 15-29 ml/min (HCC) -     CBC -     Renal Function Panel -     Magnesium  GERD without esophagitis -     omeprazole (PRILOSEC) 20 MG capsule; Take 1 capsule (20 mg total) by mouth daily.  Schedule appt with nephrology Maintain upcoming appt with cardiology Call home health agency for oxygen tank today.  Improved cbc, magnesium and renal function. Creatinine decreased from 3.33 to 1.78, normal phosphorus, and BUN 44 to 35. You are at stage 4 CKD. Schedule appt with nephrologist as discussed Maintain appt with  cardiology  I decreased omeprazole to 67m daily due to CKD Gabapentin refilled Need to check glucose before meals Do not take insulin if glucose <150.  I have changed CBarbette Or Rottenberg "Diane"'s omeprazole. I am also having her maintain her busPIRone, aspirin EC, nitroGLYCERIN, allopurinol, acetaminophen, hydrALAZINE, empagliflozin, OneTouch Verio, OneTouch Verio, benzonatate, albuterol, topiramate, NovoLOG FlexPen, isosorbide mononitrate, Verapamil HCl CR, FLUoxetine, rosuvastatin, fenofibrate, torsemide, and gabapentin.  Meds ordered this encounter  Medications  . gabapentin (NEURONTIN) 400 MG capsule    Sig: Take 1 capsule (400 mg total) by mouth at bedtime.    Dispense:  90 capsule    Refill:  3    Order Specific Question:   Supervising Provider    Answer:   CRonnald Nian[[[id number]] . omeprazole (PRILOSEC) 20 MG capsule    Sig: Take 1 capsule (20 mg total) by mouth daily.    Dispense:  90 capsule    Refill:  1    Change in dose    Order Specific Question:   Supervising Provider    Answer:   CRonnald Nian[[[id number]   Problem List Items Addressed This Visit      Digestive   GERD without esophagitis   Relevant Medications   omeprazole (PRILOSEC) 20 MG capsule     Endocrine   Type 2 diabetes mellitus with diabetic polyneuropathy, with long-term current use of insulin ([facility]    Last HgbA1c of 6.3 Glucose trend during hospitalization: 150s-110s Continue jardiance Need to check glucose before meals Do not take insulin if glucose <150.      Relevant Medications   gabapentin (NEURONTIN) 400 MG capsule     Genitourinary   CKD (chronic kidney disease) stage 4, GFR 15-29 ml/min (HCC)   Relevant Orders   CBC (Completed)   Renal Function Panel (Completed)   Magnesium (Completed)      Follow-up: Return in about 3 months (around 07/25/2020) for Dm and , hyperlipidemia (F2F, 355ms).  ChWilfred LacyNP

## 2020-04-25 ENCOUNTER — Telehealth: Payer: Self-pay | Admitting: Nurse Practitioner

## 2020-04-25 NOTE — Telephone Encounter (Signed)
Patient is calling and wanted to see if a copy of her labs be faxed over to Dr. Hollie Salk. CB is [phone]

## 2020-04-25 NOTE — Telephone Encounter (Signed)
Pt was contacted and recent lab results faxed to [facility].

## 2020-04-30 NOTE — Assessment & Plan Note (Signed)
Last HgbA1c of 6.3 Glucose trend during hospitalization: 150s-110s Continue jardiance Need to check glucose before meals Do not take insulin if glucose <150.

## 2020-05-01 ENCOUNTER — Telehealth (HOSPITAL_COMMUNITY): Payer: Self-pay | Admitting: *Deleted

## 2020-05-01 ENCOUNTER — Emergency Department (HOSPITAL_COMMUNITY)
Admission: EM | Admit: 2020-05-01 | Discharge: 2020-05-01 | Disposition: A | Discharge status: Home / Self Care | Payer: Medicare Other | Attending: Emergency Medicine | Admitting: Emergency Medicine

## 2020-05-01 ENCOUNTER — Encounter (HOSPITAL_COMMUNITY): Payer: Self-pay | Admitting: Emergency Medicine

## 2020-05-01 ENCOUNTER — Emergency Department (HOSPITAL_COMMUNITY): Payer: Medicare Other

## 2020-05-01 ENCOUNTER — Other Ambulatory Visit: Payer: Self-pay

## 2020-05-01 DIAGNOSIS — Z20822 Contact with and (suspected) exposure to covid-19: Secondary | ICD-10-CM | POA: Insufficient documentation

## 2020-05-01 DIAGNOSIS — R600 Localized edema: Secondary | ICD-10-CM | POA: Insufficient documentation

## 2020-05-01 DIAGNOSIS — I5042 Chronic combined systolic (congestive) and diastolic (congestive) heart failure: Secondary | ICD-10-CM | POA: Diagnosis not present

## 2020-05-01 DIAGNOSIS — E669 Obesity, unspecified: Secondary | ICD-10-CM | POA: Insufficient documentation

## 2020-05-01 DIAGNOSIS — J189 Pneumonia, unspecified organism: Secondary | ICD-10-CM | POA: Diagnosis not present

## 2020-05-01 DIAGNOSIS — Z794 Long term (current) use of insulin: Secondary | ICD-10-CM | POA: Diagnosis not present

## 2020-05-01 DIAGNOSIS — R609 Edema, unspecified: Secondary | ICD-10-CM | POA: Diagnosis not present

## 2020-05-01 DIAGNOSIS — Z6841 Body Mass Index (BMI) 40.0 and over, adult: Secondary | ICD-10-CM | POA: Diagnosis not present

## 2020-05-01 DIAGNOSIS — Z79899 Other long term (current) drug therapy: Secondary | ICD-10-CM | POA: Insufficient documentation

## 2020-05-01 DIAGNOSIS — Z7982 Long term (current) use of aspirin: Secondary | ICD-10-CM | POA: Insufficient documentation

## 2020-05-01 DIAGNOSIS — E1122 Type 2 diabetes mellitus with diabetic chronic kidney disease: Secondary | ICD-10-CM | POA: Insufficient documentation

## 2020-05-01 DIAGNOSIS — I130 Hypertensive heart and chronic kidney disease with heart failure and stage 1 through stage 4 chronic kidney disease, or unspecified chronic kidney disease: Secondary | ICD-10-CM | POA: Insufficient documentation

## 2020-05-01 DIAGNOSIS — N184 Chronic kidney disease, stage 4 (severe): Secondary | ICD-10-CM | POA: Insufficient documentation

## 2020-05-01 DIAGNOSIS — R0602 Shortness of breath: Secondary | ICD-10-CM | POA: Diagnosis not present

## 2020-05-01 DIAGNOSIS — I10 Essential (primary) hypertension: Secondary | ICD-10-CM | POA: Diagnosis not present

## 2020-05-01 DIAGNOSIS — Z87891 Personal history of nicotine dependence: Secondary | ICD-10-CM | POA: Diagnosis not present

## 2020-05-01 LAB — SARS CORONAVIRUS 2 BY RT PCR (HOSPITAL ORDER, PERFORMED IN ~~LOC~~ HOSPITAL LAB): SARS Coronavirus 2: NEGATIVE

## 2020-05-01 LAB — CBC WITH DIFFERENTIAL/PLATELET
Abs Immature Granulocytes: 0.01 10*3/uL (ref 0.00–0.07)
Basophils Absolute: 0 10*3/uL (ref 0.0–0.1)
Basophils Relative: 0 %
Eosinophils Absolute: 0.2 10*3/uL (ref 0.0–0.5)
Eosinophils Relative: 3 %
HCT: 42.1 % (ref 36.0–46.0)
Hemoglobin: 12.6 g/dL (ref 12.0–15.0)
Immature Granulocytes: 0 %
Lymphocytes Relative: 15 %
Lymphs Abs: 0.9 10*3/uL (ref 0.7–4.0)
MCH: 28.7 pg (ref 26.0–34.0)
MCHC: 29.9 g/dL — ABNORMAL LOW (ref 30.0–36.0)
MCV: 95.9 fL (ref 80.0–100.0)
Monocytes Absolute: 0.3 10*3/uL (ref 0.1–1.0)
Monocytes Relative: 5 %
Neutro Abs: 4.7 10*3/uL (ref 1.7–7.7)
Neutrophils Relative %: 77 %
Platelets: 168 10*3/uL (ref 150–400)
RBC: 4.39 MIL/uL (ref 3.87–5.11)
RDW: 15 % (ref 11.5–15.5)
WBC: 6.1 10*3/uL (ref 4.0–10.5)
nRBC: 0 % (ref 0.0–0.2)

## 2020-05-01 LAB — BASIC METABOLIC PANEL
Anion gap: 9 (ref 5–15)
BUN: 26 mg/dL — ABNORMAL HIGH (ref 8–23)
CO2: 25 mmol/L (ref 22–32)
Calcium: 8.5 mg/dL — ABNORMAL LOW (ref 8.9–10.3)
Chloride: 106 mmol/L (ref 98–111)
Creatinine, Ser: 1.5 mg/dL — ABNORMAL HIGH (ref 0.44–1.00)
GFR calc Af Amer: 43 mL/min — ABNORMAL LOW (ref >60)
GFR calc non Af Amer: 37 mL/min — ABNORMAL LOW (ref >60)
Glucose, Bld: 104 mg/dL — ABNORMAL HIGH (ref 70–99)
Potassium: 5.1 mmol/L (ref 3.5–5.1)
Sodium: 140 mmol/L (ref 135–145)

## 2020-05-01 LAB — BRAIN NATRIURETIC PEPTIDE: B Natriuretic Peptide: 2032.8 pg/mL — ABNORMAL HIGH (ref 0.0–100.0)

## 2020-05-01 MED ORDER — AMOXICILLIN-POT CLAVULANATE 875-125 MG PO TABS
1.0000 | ORAL_TABLET | Freq: Two times a day (BID) | ORAL | 0 refills | Status: DC
Start: 2020-05-01 — End: 2020-06-20

## 2020-05-01 NOTE — Discharge Instructions (Signed)
Make sure you take all of your medications including your fluid pill, to improve your discomfort.  Follow-up with your doctor for a checkup in a week or so.

## 2020-05-01 NOTE — ED Provider Notes (Signed)
[facility] EMERGENCY DEPARTMENT Provider Note   CSN: [id number] Arrival date & time: 05/01/20  1206     History Chief Complaint  Patient presents with  . Shortness of Breath    [name] is a 64 y.o. female.  HPI She presents for evaluation of shortness of breath and dyspnea exertion, following recent hospitalization for similar problem.  She is on home oxygen and has noticed her O2 saturations dropping into the 70s.  She was transferred here by EMS who treated her with nitroglycerin during transport.  Patient denies having any chest pain.  She states she has been walking in her home without her oxygen on and has noticed her sats dropping to the 60s.  During the recent hospitalization, she was diuresed and had elevated creatinine level.  She was discharged on 04/20/2020, at that time told to stop taking her diuretic medication but to restart on 04/22/2020.  She has not restarted it because she is worried that her creatinine would go back up.  She is eating normally.  She plans on moving in with her sister soon.  There are no other known modifying factors.    Past Medical History:  Diagnosis Date  . Anxiety    doesn't take any meds for this  . Arteriosclerosis of coronary artery 08/31/2011   Myoview 9/12: EF 44, no ischemia // Myoview 8/18: EF 57, low risk, possible small area of inferior ischemia // Minor nonobstructive CAD by cardiac catheterization - [facility] 8/18: Proximal LAD 35, OM2 25  . Arthritis    knuckles  . Asthma   . Carotid artery disease ([place])    Carotid US 7/14: Bilateral ICA 40-59 // Carotid US ([facility]) 2/17: bilat plaque without sig stenosis  . Chronic diastolic CHF (congestive heart failure) ([place]) 07/26/2017   Echo 7/18:Severe conc LVH, EF 60-65, no RWMA, Gr 2 DD, MAC, mild MR // right and left heart cath 8/18: LVEDP 21  . Claustrophobia 07/26/2012  . COPD (chronic obstructive pulmonary disease) ([place])    "chronic bronchitis - about every  winter"  . CSF leak    dizziness/headache - assoc symptoms // 2/2 encephalocele s/p skull base reconstruction  . Diverticulosis   . Dyspnea   . History of CVA (cerebrovascular accident) 03/2004   denies residual 07/26/2012  . Hx of gout   . Hyperlipidemia    takes Pravastatin daily  . Hypertensive heart disease with chronic diastolic congestive heart failure ([place]) 07/26/2017  . Internal hemorrhoids   . Kidney stones   . LVH (left ventricular hypertrophy)    Echo 8/18: Severe conc LVH, EF 60-65, no RWMA, Gr 2 DD, MAC, mild MR  . OSA (obstructive sleep apnea) 07/26/2012   prior CPAP - stopped due to being primary caregiver for dying parent (stopped in 2016)  . PSVT (paroxysmal supraventricular tachycardia) ([place])   . Pulmonary hypertension, unspecified ([place]) 08/14/2017   cath 07/2017 >>PASP 27m Hg, PA mean 39 mm Hg  . Type II diabetes mellitus ([facility]     Patient Active Problem List   Diagnosis Date Noted  . Acute on chronic diastolic CHF (congestive heart failure) ([facility] 04/17/2020  . Heart failure ([facility] 04/17/2020  . Acute on chronic combined systolic and diastolic CHF (congestive heart failure) ([facility] 04/16/2020  . CKD (chronic kidney disease) stage 4, GFR 15-29 ml/min ([facility]) 04/16/2020  . GERD without esophagitis 04/16/2020  . Biventricular heart failure ([facility] 09/07/2019  . Acute gout 09/07/2019  . Migraine syndrome 07/05/2019  . Edema  05/23/2019  . COPD (chronic obstructive pulmonary disease) ([place]) 01/05/2019  . Type 2 diabetes mellitus with stage 3 chronic kidney disease, with long-term current use of insulin ([place]) 11/18/2018  . Screening for breast cancer 06/30/2018  . Nicotine dependence, cigarettes, in remission 03/31/2018  . Pulmonary hypertension, unspecified ([place]) 08/14/2017  . Hypertensive heart disease with chronic diastolic congestive heart failure ([place]) 07/26/2017  . Paroxysmal SVT (supraventricular tachycardia) ([place]) 07/26/2017  . COPD  GOLD 0 still smoking 07/13/2017    . Moderate episode of recurrent major depressive disorder ([place]) 07/12/2015  . OSA (obstructive sleep apnea) 10/17/2014  . Carotid artery disease ([place]) 06/03/2013  . Uric acid kidney stone 07/04/2012  . CSF leak from nose 10/19/2011  . Arteriosclerosis of coronary artery 08/31/2011  . Type 2 diabetes mellitus with diabetic polyneuropathy, with long-term current use of insulin ([place]) 08/31/2011  . HLD (hyperlipidemia) 07/02/2011  . Essential hypertension 06/20/2011  . Morbid obesity due to excess calories ([place]) 06/20/2011    Past Surgical History:  Procedure Laterality Date  . ESOPHAGOGASTRODUODENOSCOPY (EGD) WITH PROPOFOL N/A 07/13/2015   Procedure: ESOPHAGOGASTRODUODENOSCOPY (EGD) WITH PROPOFOL;  Surgeon: Carol Ada, MD;  Location: [facility];  Service: Endoscopy;  Laterality: N/A;  . NASAL SINUS SURGERY  01/29/2012   Procedure: ENDOSCOPIC SINUS SURGERY WITH STEALTH;  Surgeon: Ruby Cola, MD;  Location: [facility];  Service: ENT;  Laterality: N/A;  . NM MYOVIEW LTD  08/25/2011   Low risk study, no evidence of ischemia, EF 44%  . REFRACTIVE SURGERY  ~ 2010   left  . RIGHT/LEFT HEART CATH AND CORONARY ANGIOGRAPHY N/A 07/30/2017   Procedure: RIGHT/LEFT HEART CATH AND CORONARY ANGIOGRAPHY;  Surgeon: Martinique, Peter M, MD;  Location: [facility];  Service: Cardiovascular;  Laterality: N/A;  . SPHENOIDECTOMY  01/29/2012   Procedure: SPHENOIDECTOMY;  Surgeon: Ruby Cola, MD;  Location: [facility];  Service: ENT;  Laterality: Left;  REPAIR OF LEFT SPHENOID    . TOTAL ABDOMINAL HYSTERECTOMY  1994     OB History    Gravida  0   Para      Term      Preterm      AB      Living        SAB      TAB      Ectopic      Multiple      Live Births              Family History  Problem Relation Age of Onset  . Colon cancer Sister 37  . Breast cancer Mother 65  . Alzheimer's disease Mother   . Diabetes Sister 46  . Heart failure Sister   . Diabetes Sister 43  . Lung cancer  Father   . Breast cancer Sister   . Cancer Sister        mouth  . Anesthesia problems Neg Hx   . Hypotension Neg Hx   . Malignant hyperthermia Neg Hx   . Pseudochol deficiency Neg Hx     Social History   Tobacco Use  . Smoking status: Former Smoker    Packs/day: 0.50    Years: 35.00    Pack years: 17.50    Types: Cigarettes    Quit date: 08/16/2019    Years since quitting: 0.7  . Smokeless tobacco: Never Used  Substance Use Topics  . Alcohol use: Yes    Alcohol/week: 1.0 standard drinks    Types: 1 Glasses of wine per week  Comment: rarely  . Drug use: No    Home Medications Prior to Admission medications   Medication Sig Start Date End Date Taking? Authorizing Provider  acetaminophen (TYLENOL) 325 MG tablet Take 2 tablets (650 mg total) by mouth every 6 (six) hours as needed for mild pain. 06/30/18  Yes Luetta Nutting, DO  albuterol (VENTOLIN HFA) 108 (90 Base) MCG/ACT inhaler TAKE 2 PUFFS BY MOUTH EVERY 6 HOURS AS NEEDED FOR WHEEZE OR SHORTNESS OF BREATH Patient taking differently: Inhale 2 puffs into the lungs every 6 (six) hours as needed for wheezing or shortness of breath.  12/19/19  Yes Luetta Nutting, DO  allopurinol (ZYLOPRIM) 100 MG tablet Take 100 mg by mouth daily as needed (gout).  06/24/18  Yes [provider]  aspirin EC 81 MG tablet Take 1 tablet (81 mg total) by mouth daily. 07/13/17  Yes Weaver, Scott T, PA-C  empagliflozin (JARDIANCE) 10 MG TABS tablet Take 10 mg by mouth daily before breakfast. Patient taking differently: Take 10 mg by mouth daily.  10/13/19  Yes Bensimhon, Shaune Pascal, MD  fenofibrate 54 MG tablet Take 1 tablet (54 mg total) by mouth daily. 04/13/20  Yes Nche, Charlene Brooke, NP  FLUoxetine (PROZAC) 20 MG capsule Take 1 capsule (20 mg total) by mouth at bedtime. Patient taking differently: Take 20 mg by mouth daily.  04/13/20  Yes Nche, Charlene Brooke, NP  gabapentin (NEURONTIN) 400 MG capsule Take 1 capsule (400 mg total) by mouth at bedtime.  04/24/20  Yes Nche, Charlene Brooke, NP  hydrALAZINE (APRESOLINE) 50 MG tablet Take 1 tablet (50 mg total) by mouth every 8 (eight) hours. Patient taking differently: Take 50 mg by mouth daily.  09/22/19  Yes Simmons, Brittainy M, PA-C  insulin aspart (NOVOLOG FLEXPEN) 100 UNIT/ML FlexPen Inject 5 Units into the skin 3 (three) times daily with meals. Dx: E11.9 01/02/20  Yes Luetta Nutting, DO  isosorbide mononitrate (IMDUR) 60 MG 24 hr tablet TAKE 1 TABLET (60 MG TOTAL) BY MOUTH DAILY FOR 30 DAYS. ** DO NOT CRUSH ** Patient taking differently: Take 60 mg by mouth daily.  03/20/20  Yes Nche, Charlene Brooke, NP  nitroGLYCERIN (NITROSTAT) 0.4 MG SL tablet Place 1 tablet (0.4 mg total) under the tongue every 5 (five) minutes as needed. Patient taking differently: Place 0.4 mg under the tongue every 5 (five) minutes as needed for chest pain.  07/27/17  Yes Weaver, Scott T, PA-C  topiramate (TOPAMAX) 25 MG tablet TAKE 1 TABLET BY MOUTH TWICE A DAY Patient taking differently: Take 25 mg by mouth daily.  01/02/20  Yes Luetta Nutting, DO  torsemide (DEMADEX) 20 MG tablet Take 1 tablet (20 mg total) by mouth daily as needed (fluid retention.). 04/22/20  Yes Gonfa, Charlesetta Ivory, MD  Verapamil HCl CR 300 MG CP24 TAKE 1 CAPSULE (300 MG TOTAL) BY MOUTH DAILY. PLEASE CALL TO SCHEDULE OVERDUE APPT WITH DR ROSS Patient taking differently: Take 300 mg by mouth daily.  04/04/20  Yes Nche, Charlene Brooke, NP  amoxicillin-clavulanate (AUGMENTIN) 875-125 MG tablet Take 1 tablet by mouth 2 (two) times daily. One po bid x 7 days 05/01/20   Daleen Bo, MD  benzonatate (TESSALON) 100 MG capsule Take 1 capsule (100 mg total) by mouth every 8 (eight) hours. Patient not taking: Reported on 05/01/2020 11/30/19   Emerson Monte, FNP  Blood Glucose Monitoring Suppl (ONETOUCH VERIO) w/Device KIT USE TO CHECK BLOOD GLUCOSE 4 TIMES A DAY . DIAGNOSIS: TYPE 2 DIABETES E11.59 Patient taking differently:  1 each by Other route See admin  instructions. USE TO CHECK BLOOD GLUCOSE 4 TIMES A DAY . DIAGNOSIS: TYPE 2 DIABETES E11.59 11/09/19   Luetta Nutting, DO  glucose blood (ONETOUCH VERIO) test strip For E11.22.  Use to check glucose qid Patient taking differently: 1 each by Other route See admin instructions. For E11.22.  Use to check glucose four times daily 11/09/19   Luetta Nutting, DO  omeprazole (PRILOSEC) 20 MG capsule Take 1 capsule (20 mg total) by mouth daily. Patient not taking: Reported on 05/01/2020 04/24/20   Nche, Charlene Brooke, NP  rosuvastatin (CRESTOR) 20 MG tablet Take 1 tablet (20 mg total) by mouth daily. Patient not taking: Reported on 05/01/2020 04/13/20 07/12/20  Flossie Buffy, NP    Allergies    Atorvastatin, Ibuprofen, Metformin and related, Ace inhibitors, Hydrocodone-acetaminophen, and Albuterol  Review of Systems   Review of Systems  All other systems reviewed and are negative.   Physical Exam Updated Vital Signs BP (!) 141/93   Pulse 63   Temp 98 F (36.7 C) (Oral)   Resp 18   Ht _0  (1.575 m)   Wt 108.9 kg   SpO2 99%   BMI 43.90 kg/m   Physical Exam Vitals and nursing note reviewed.  Constitutional:      General: She is not in acute distress.    Appearance: She is well-developed. She is obese. She is not ill-appearing, toxic-appearing or diaphoretic.  HENT:     Head: Normocephalic and atraumatic.     Right Ear: External ear normal.     Left Ear: External ear normal.  Eyes:     Conjunctiva/sclera: Conjunctivae normal.     Pupils: Pupils are equal, round, and reactive to light.  Neck:     Trachea: Phonation normal.  Cardiovascular:     Rate and Rhythm: Normal rate and regular rhythm.     Heart sounds: Normal heart sounds.  Pulmonary:     Effort: Pulmonary effort is normal. No respiratory distress.     Breath sounds: Normal breath sounds. No stridor.  Abdominal:     General: There is no distension.     Palpations: Abdomen is soft.     Tenderness: There is no abdominal  tenderness.  Musculoskeletal:        General: No tenderness. Normal range of motion.     Cervical back: Normal range of motion and neck supple.     Right lower leg: Edema present.     Left lower leg: Edema present.  Skin:    General: Skin is warm and dry.  Neurological:     Mental Status: She is alert and oriented to person, place, and time.     Cranial Nerves: No cranial nerve deficit.     Sensory: No sensory deficit.     Motor: No abnormal muscle tone.     Coordination: Coordination normal.  Psychiatric:        Mood and Affect: Mood normal.        Behavior: Behavior normal.        Thought Content: Thought content normal.        Judgment: Judgment normal.     ED Results / Procedures / Treatments   Labs (all labs ordered are listed, but only abnormal results are displayed) Labs Reviewed  BASIC METABOLIC PANEL - Abnormal; Notable for the following components:      Result Value   Glucose, Bld 104 (*)    BUN 26 (*)  Creatinine, Ser 1.50 (*)    Calcium 8.5 (*)    GFR calc non Af Amer 37 (*)    GFR calc Af Amer 43 (*)    All other components within normal limits  CBC WITH DIFFERENTIAL/PLATELET - Abnormal; Notable for the following components:   MCHC 29.9 (*)    All other components within normal limits  BRAIN NATRIURETIC PEPTIDE - Abnormal; Notable for the following components:   B Natriuretic Peptide 2,032.8 (*)    All other components within normal limits    EKG EKG Interpretation  Date/Time:  Tuesday May 01 2020 12:18:37 EDT Ventricular Rate:  73 PR Interval:    QRS Duration: 95 QT Interval:  426 QTC Calculation: 470 R Axis:   -145 Text Interpretation: Sinus rhythm Probable left atrial enlargement Low voltage with right axis deviation Consider anterior infarct since last tracing no significant change Confirmed by Daleen Bo [phone]) on 05/01/2020 12:26:12 PM   Radiology DG Chest Port 1 View  Result Date: 05/01/2020 CLINICAL DATA:  Shortness of breath EXAM:  PORTABLE CHEST 1 VIEW COMPARISON:  04/16/2020 FINDINGS: Cardiac shadow is enlarged but stable. Aortic calcifications are again seen. Lungs are well aerated bilaterally. Slight increased opacity is noted in the left lung base consistent with early infiltrate. No other focal abnormality is noted. IMPRESSION: Early left basilar infiltrate. Electronically Signed   By: Inez Catalina M.D.   On: 05/01/2020 14:11    Procedures Procedures (including critical care time)  Medications Ordered in ED Medications - No data to display  ED Course  I have reviewed the triage vital signs and the nursing notes.  Pertinent labs & imaging results that were available during my care of the patient were reviewed by me and considered in my medical decision making (see chart for details).  Clinical Course as of May 01 1520  Tue May 01, 2020  1504 Normal except glucose high, BUN high, creatinine high, calcium low, GFR low  Basic metabolic panel(!) [EW]  8588 Normal  CBC with Differential(!) [EW]  1505 Per radiologist, slight increase in left lower lobe opacity  DG Chest Port 1 View [EW]    Clinical Course User Index [EW] Daleen Bo, MD   MDM Rules/Calculators/A&P                       Patient Vitals for the past 24 hrs:  BP Temp Temp src Pulse Resp SpO2 Height Weight  05/01/20 1445 (!) 141/93 -- -- 63 18 99 % -- --  05/01/20 1300 (!) 159/70 -- -- 66 14 97 % -- --  05/01/20 1223 (!) 163/76 98 F (36.7 C) Oral -- (!) 22 98 % -- --  05/01/20 1220 -- -- -- -- -- -- _0  (1.575 m) 108.9 kg    3:18 PM Reevaluation with update and discussion. After initial assessment and treatment, an updated evaluation reveals she is comfortable now saturating 100% on her normal oxygen supplementation by nasal cannula.  Findings discussed and questions answered. Daleen Bo   Medical Decision Making:  This patient is presenting for evaluation of shortness of breath, which does require a range of treatment options, and  is a complaint that involves a moderate risk of morbidity and mortality. The differential diagnoses include pneumonia, heart failure, PE. I decided to review old records, and in summary obese female currently on oxygen supplementation with multiple medical problems.  Recently hospitalized, diuresed with elevated creatinine, told to restart diuretic but did not.  I  did not additional historical information from anyone.  Clinical Laboratory Tests Ordered, included CBC and Metabolic panel. Review indicates mild creatinine elevation, improved from baseline. Radiologic Tests Ordered, included chest x-ray.  I independently Visualized: Radiographic images, which show prior left lower lobe infiltrate  Cardiac Monitor Tracing which shows normal sinus rhythm    Critical Interventions-clinical evaluation, laboratory testing, chest x-ray, observation, reevaluation  After These Interventions, the Patient was reevaluated and was found improved and stable for discharge.  Symptomatic pneumonia, causing shortness of breath and mild cough.  Doubt unstable congestive heart failure, ACS or PE.  CRITICAL CARE-no Performed by: Daleen Bo  Nursing Notes Reviewed/ Care Coordinated Applicable Imaging Reviewed Interpretation of Laboratory Data incorporated into ED treatment  The patient appears reasonably screened and/or stabilized for discharge and I doubt any other medical condition or other [facility] requiring further screening, evaluation, or treatment in the ED at this time prior to discharge.  Plan: Home Medications-continue usual; Home Treatments-rest, usual diet; return here if the recommended treatment, does not improve the symptoms; Recommended follow up-PCP follow-up 1 week and as needed     Final Clinical Impression(s) / ED Diagnoses Final diagnoses:  Community acquired pneumonia of left lower lobe of lung    Rx / DC Orders ED Discharge Orders         Ordered    amoxicillin-clavulanate  (AUGMENTIN) 875-125 MG tablet  2 times daily     05/01/20 1517           Daleen Bo, MD 05/01/20 1521

## 2020-05-01 NOTE — Plan of Care (Signed)
[name] with the patient about our program.  She is nervous about a home based program.  She says she would need to stay with her sister.  She is nervous about diuretic therapy and the possibility of dialysis.  She reports her weight is up 3lbs since discharge from the hospital and that she never restarted torsemide as instructed.   She is not using cpap at home.  She reports development of productive cough which is new and increased shortness of breath.  Ultimately after discussion with the patient and ED provider she was not enrolled in the program.    Katherine Roan, MD 05/01/2020, 1:59 PM

## 2020-05-01 NOTE — ED Triage Notes (Signed)
Pt arrives via EMS- hx of CHF, COPD. Wears 2L [place] at home. Pt recently hospitalized for SOB. Pt has continued to have increased SOB esp on exertion. Poor urine output, increased abd swelling and ankle swelling. When pt walks on 2L  [place], sats drop to 70-80s- per EMS. BP 200/110, HR 60, 93% on 3L [place]. EMS gave 1 nitro. Denies CP. Pt is currently not taking lasix, since she was discharged from the hospital due to her kidney function.

## 2020-05-01 NOTE — Telephone Encounter (Signed)
Received a VM at 10:36am that pt took off oxygen to go to the bathroom and her O2 dropped in the 70's pt said she didn't think that was normal and requested a call back. I called pt back but no answer because pt is the emergency dept for shortness of breath. ED note at 12:15pm

## 2020-05-02 ENCOUNTER — Encounter (HOSPITAL_COMMUNITY): Payer: Self-pay

## 2020-05-03 ENCOUNTER — Encounter (HOSPITAL_COMMUNITY): Payer: Self-pay

## 2020-05-03 ENCOUNTER — Other Ambulatory Visit: Payer: Self-pay

## 2020-05-03 ENCOUNTER — Ambulatory Visit (HOSPITAL_COMMUNITY)
Admit: 2020-05-03 | Discharge: 2020-05-03 | Disposition: A | Discharge status: Home / Self Care | Payer: Medicare Other | Source: Ambulatory Visit | Attending: Cardiology | Admitting: Cardiology

## 2020-05-03 VITALS — BP 142/80 | HR 66 | Wt 244.0 lb

## 2020-05-03 DIAGNOSIS — Z6841 Body Mass Index (BMI) 40.0 and over, adult: Secondary | ICD-10-CM | POA: Insufficient documentation

## 2020-05-03 DIAGNOSIS — M1990 Unspecified osteoarthritis, unspecified site: Secondary | ICD-10-CM | POA: Diagnosis not present

## 2020-05-03 DIAGNOSIS — Z8673 Personal history of transient ischemic attack (TIA), and cerebral infarction without residual deficits: Secondary | ICD-10-CM | POA: Insufficient documentation

## 2020-05-03 DIAGNOSIS — I272 Pulmonary hypertension, unspecified: Secondary | ICD-10-CM | POA: Diagnosis not present

## 2020-05-03 DIAGNOSIS — I428 Other cardiomyopathies: Secondary | ICD-10-CM | POA: Diagnosis not present

## 2020-05-03 DIAGNOSIS — J9611 Chronic respiratory failure with hypoxia: Secondary | ICD-10-CM | POA: Diagnosis not present

## 2020-05-03 DIAGNOSIS — N183 Chronic kidney disease, stage 3 unspecified: Secondary | ICD-10-CM | POA: Insufficient documentation

## 2020-05-03 DIAGNOSIS — I471 Supraventricular tachycardia: Secondary | ICD-10-CM | POA: Insufficient documentation

## 2020-05-03 DIAGNOSIS — J45909 Unspecified asthma, uncomplicated: Secondary | ICD-10-CM | POA: Insufficient documentation

## 2020-05-03 DIAGNOSIS — I2510 Atherosclerotic heart disease of native coronary artery without angina pectoris: Secondary | ICD-10-CM | POA: Diagnosis not present

## 2020-05-03 DIAGNOSIS — I5082 Biventricular heart failure: Secondary | ICD-10-CM | POA: Insufficient documentation

## 2020-05-03 DIAGNOSIS — I5032 Chronic diastolic (congestive) heart failure: Secondary | ICD-10-CM | POA: Diagnosis not present

## 2020-05-03 DIAGNOSIS — E1122 Type 2 diabetes mellitus with diabetic chronic kidney disease: Secondary | ICD-10-CM | POA: Insufficient documentation

## 2020-05-03 DIAGNOSIS — N179 Acute kidney failure, unspecified: Secondary | ICD-10-CM | POA: Insufficient documentation

## 2020-05-03 DIAGNOSIS — E785 Hyperlipidemia, unspecified: Secondary | ICD-10-CM | POA: Insufficient documentation

## 2020-05-03 DIAGNOSIS — Z794 Long term (current) use of insulin: Secondary | ICD-10-CM | POA: Insufficient documentation

## 2020-05-03 DIAGNOSIS — Z7982 Long term (current) use of aspirin: Secondary | ICD-10-CM | POA: Diagnosis not present

## 2020-05-03 DIAGNOSIS — Z79899 Other long term (current) drug therapy: Secondary | ICD-10-CM | POA: Diagnosis not present

## 2020-05-03 DIAGNOSIS — I5042 Chronic combined systolic (congestive) and diastolic (congestive) heart failure: Secondary | ICD-10-CM | POA: Insufficient documentation

## 2020-05-03 DIAGNOSIS — G4733 Obstructive sleep apnea (adult) (pediatric): Secondary | ICD-10-CM | POA: Diagnosis not present

## 2020-05-03 DIAGNOSIS — J449 Chronic obstructive pulmonary disease, unspecified: Secondary | ICD-10-CM | POA: Insufficient documentation

## 2020-05-03 DIAGNOSIS — F419 Anxiety disorder, unspecified: Secondary | ICD-10-CM | POA: Insufficient documentation

## 2020-05-03 DIAGNOSIS — Z87891 Personal history of nicotine dependence: Secondary | ICD-10-CM | POA: Insufficient documentation

## 2020-05-03 DIAGNOSIS — I130 Hypertensive heart and chronic kidney disease with heart failure and stage 1 through stage 4 chronic kidney disease, or unspecified chronic kidney disease: Secondary | ICD-10-CM | POA: Diagnosis not present

## 2020-05-03 DIAGNOSIS — R002 Palpitations: Secondary | ICD-10-CM | POA: Insufficient documentation

## 2020-05-03 LAB — BASIC METABOLIC PANEL
Anion gap: 11 (ref 5–15)
BUN: 31 mg/dL — ABNORMAL HIGH (ref 8–23)
CO2: 26 mmol/L (ref 22–32)
Calcium: 8.4 mg/dL — ABNORMAL LOW (ref 8.9–10.3)
Chloride: 106 mmol/L (ref 98–111)
Creatinine, Ser: 2.06 mg/dL — ABNORMAL HIGH (ref 0.44–1.00)
GFR calc Af Amer: 29 mL/min — ABNORMAL LOW (ref >60)
GFR calc non Af Amer: 25 mL/min — ABNORMAL LOW (ref >60)
Glucose, Bld: 117 mg/dL — ABNORMAL HIGH (ref 70–99)
Potassium: 4 mmol/L (ref 3.5–5.1)
Sodium: 143 mmol/L (ref 135–145)

## 2020-05-03 MED ORDER — TORSEMIDE 20 MG PO TABS: 40.0000 mg | ORAL_TABLET | Freq: Every day | ORAL | 6 refills | Status: AC

## 2020-05-03 NOTE — Progress Notes (Signed)
ADVANCED HF CLINIC NOTE  PCP: Primary Cardiologist: Dr Harrington Challenger HF MD; Dr Haroldine Laws  Reason for Visit: f/u for Chronic Systolic HF. [facility] f/u for a/c CHF.   HPI: Angel Blok Sprinkleis a 64 y.o.femalewith a hx of morbid obesity, chronic systolic/diastolic CHF, PSVT, prior CVA, HTN, DM, OSA, CKD with baseline creatinine 1.3-1.4, COPD with ongoing tobacco, pulmonary hypertensionand CSF rhinorrhea s/p skull base reconstruction.   Admitted with A/C diastolic heart failure. > 30 pound weight gain in a few weeks after her  PCP discontinued lasix  06/27/2019 secondary to worsening kidney function. In ER markedly volume overloaded. [name] was placed to guide therapy with initial swan numbers showing cardiac index 1.6 and elevated filling pressures. ECHO completed and showed biventricular HF with LVEF 25-30 and RV severely reduced. Placed on milrinone and diuresed with IV lasix. As she improved milrinone was weaned off. HF medications adjusted. Discharge weight was 236 pounds.   Echo 1/21 showed  EF 45-50% with mod RV dysfunction. Grade 2 DD. MV is thickened with mild MR/MS  Zio patch placed 1/21 for palpitations and showed frequent runs of SVT. Was referred to Dr. Lovena Le w/ EP. Plan is for SVT ablation in June.   Recently admitted to [facility] 4/21 for a/c CHF requiring IV Lasix. Hospital course c/b AKI. SCr spiked to 3.3. [name] was discontinued.  SCr improved, down to 1.78 day of discharge. She is now only on torsemide 20 mg PRN. She remains on Jardiance. She was also unable to wean off of supplemental O2. She is now on home O2 at 2L/ min.   She went back to the ED, just 2 days ago on 5/25 for worsening dyspnea and diagnosed w/ Left sided PNA and placed on abx. Did not require admission. BNP was also checked in ED that day was was up to 2,032 (up from 1,641). SCr down to 1.50. Diuretic not adjusted.    She presents to clinic today for f/u. She is here w/ her niece. She feels slightly improved but  still not back to baseline. Continues to require supp O2. Continues to feel SOB w/ physical activity.     Cardiac Testing Echo 08/18/19 EF 25-30% Mod LVH. RV dilated and severely HK. Personally reviewed Echo 1/21 EF 45-50% with mod RV dysfunction. Grade 2 DD. MV is thickened with mild MR/MS  RHC/LHC 07/31/2019  Cardiac catheterization demonstrated minor nonobstructive CAD, mildly elevated LVEDP and moderate pulmonary hypertension. LAD 35% OM2 25% RA 10 PA 57/22 (39) PCWP 19 LVEDP 21 Fick 3.7/1.7   ROS: All systems negative except as listed in HPI, PMH and Problem List.  SH:  Social History   Socioeconomic History  . Marital status: Single    Spouse name: Not on file  . Number of children: 0  . Years of education: Not on file  . Highest education level: Not on file  Occupational History  . Occupation: disabled  Tobacco Use  . Smoking status: Former Smoker    Packs/day: 0.50    Years: 35.00    Pack years: 17.50    Types: Cigarettes    Quit date: 08/16/2019    Years since quitting: 0.7  . Smokeless tobacco: Never Used  Substance and Sexual Activity  . Alcohol use: Yes    Alcohol/week: 1.0 standard drinks    Types: 1 Glasses of wine per week    Comment: rarely  . Drug use: No  . Sexual activity: Not Currently    Birth control/protection: Surgical  Other Topics  Concern  . Not on file  Social History Narrative   Currently unemployed.   No children, not married   Social Determinants of Radio broadcast assistant Strain:   . Difficulty of Paying Living Expenses:   Food Insecurity:   . Worried About Charity fundraiser in the Last Year:   . Arboriculturist in the Last Year:   Transportation Needs:   . Film/video editor (Medical):   Marland Kitchen Lack of Transportation (Non-Medical):   Physical Activity:   . Days of Exercise per Week:   . Minutes of Exercise per Session:   Stress:   . Feeling of Stress :   Social Connections:   . Frequency of Communication with  Friends and Family:   . Frequency of Social Gatherings with Friends and Family:   . Attends Religious Services:   . Active Member of Clubs or Organizations:   . Attends Archivist Meetings:   Marland Kitchen Marital Status:   Intimate Partner Violence:   . Fear of Current or Ex-Partner:   . Emotionally Abused:   Marland Kitchen Physically Abused:   . Sexually Abused:     FH:  Family History  Problem Relation Age of Onset  . Colon cancer Sister 86  . Breast cancer Mother 36  . Alzheimer's disease Mother   . Diabetes Sister 23  . Heart failure Sister   . Diabetes Sister 47  . Lung cancer Father   . Breast cancer Sister   . Cancer Sister        mouth  . Anesthesia problems Neg Hx   . Hypotension Neg Hx   . Malignant hyperthermia Neg Hx   . Pseudochol deficiency Neg Hx     Past Medical History:  Diagnosis Date  . Anxiety    doesn't take any meds for this  . Arteriosclerosis of coronary artery 08/31/2011   Myoview 9/12: EF 44, no ischemia // Myoview 8/18: EF 57, low risk, possible small area of inferior ischemia // Minor nonobstructive CAD by cardiac catheterization - LHC 8/18: Proximal LAD 35, OM2 25  . Arthritis    knuckles  . Asthma   . Carotid artery disease ([place])    Carotid US 7/14: Bilateral ICA 40-59 // Carotid US ([facility]) 2/17: bilat plaque without sig stenosis  . Chronic diastolic CHF (congestive heart failure) ([place]) 07/26/2017   Echo 7/18:Severe conc LVH, EF 60-65, no RWMA, Gr 2 DD, MAC, mild MR // right and left heart cath 8/18: LVEDP 21  . Claustrophobia 07/26/2012  . COPD (chronic obstructive pulmonary disease) ([place])    "chronic bronchitis - about every winter"  . CSF leak    dizziness/headache - assoc symptoms // 2/2 encephalocele s/p skull base reconstruction  . Diverticulosis   . Dyspnea   . History of CVA (cerebrovascular accident) 03/2004   denies residual 07/26/2012  . Hx of gout   . Hyperlipidemia    takes Pravastatin daily  . Hypertensive heart disease with chronic  diastolic congestive heart failure ([place]) 07/26/2017  . Internal hemorrhoids   . Kidney stones   . LVH (left ventricular hypertrophy)    Echo 8/18: Severe conc LVH, EF 60-65, no RWMA, Gr 2 DD, MAC, mild MR  . OSA (obstructive sleep apnea) 07/26/2012   prior CPAP - stopped due to being primary caregiver for dying parent (stopped in 2016)  . PSVT (paroxysmal supraventricular tachycardia) ([place])   . Pulmonary hypertension, unspecified ([place]) 08/14/2017   cath 07/2017 >>PASP 69m Hg,  PA mean 39 mm Hg  . Type II diabetes mellitus ([place])     Current Outpatient Medications  Medication Sig Dispense Refill  . acetaminophen (TYLENOL) 325 MG tablet Take 2 tablets (650 mg total) by mouth every 6 (six) hours as needed for mild pain. 90 tablet 2  . albuterol (VENTOLIN HFA) 108 (90 Base) MCG/ACT inhaler TAKE 2 PUFFS BY MOUTH EVERY 6 HOURS AS NEEDED FOR WHEEZE OR SHORTNESS OF BREATH 18 g 1  . allopurinol (ZYLOPRIM) 100 MG tablet Take 100 mg by mouth daily as needed (gout).     Marland Kitchen amoxicillin-clavulanate (AUGMENTIN) 875-125 MG tablet Take 1 tablet by mouth 2 (two) times daily. One po bid x 7 days 14 tablet 0  . aspirin EC 81 MG tablet Take 1 tablet (81 mg total) by mouth daily. 90 tablet 3  . benzonatate (TESSALON) 100 MG capsule Take 1 capsule (100 mg total) by mouth every 8 (eight) hours. 21 capsule 0  . Blood Glucose Monitoring Suppl (ONETOUCH VERIO) w/Device KIT USE TO CHECK BLOOD GLUCOSE 4 TIMES A DAY . DIAGNOSIS: TYPE 2 DIABETES E11.59 1 kit 0  . fenofibrate 54 MG tablet Take 1 tablet (54 mg total) by mouth daily. 90 tablet 3  . FLUoxetine (PROZAC) 20 MG capsule Take 1 capsule (20 mg total) by mouth at bedtime. 90 capsule 3  . gabapentin (NEURONTIN) 400 MG capsule Take 1 capsule (400 mg total) by mouth at bedtime. 90 capsule 3  . glucose blood (ONETOUCH VERIO) test strip For E11.22.  Use to check glucose qid 100 strip 12  . hydrALAZINE (APRESOLINE) 50 MG tablet Take 1 tablet (50 mg total) by mouth every 8  (eight) hours. 270 tablet 3  . insulin aspart (NOVOLOG FLEXPEN) 100 UNIT/ML FlexPen Inject 5 Units into the skin 3 (three) times daily with meals. Dx: E11.9 15 mL   . isosorbide mononitrate (IMDUR) 60 MG 24 hr tablet TAKE 1 TABLET (60 MG TOTAL) BY MOUTH DAILY FOR 30 DAYS. ** DO NOT CRUSH ** 90 tablet 0  . nitroGLYCERIN (NITROSTAT) 0.4 MG SL tablet Place 1 tablet (0.4 mg total) under the tongue every 5 (five) minutes as needed. 25 tablet 3  . omeprazole (PRILOSEC) 20 MG capsule Take 1 capsule (20 mg total) by mouth daily. 90 capsule 1  . topiramate (TOPAMAX) 25 MG tablet TAKE 1 TABLET BY MOUTH TWICE A DAY 180 tablet 1  . torsemide (DEMADEX) 20 MG tablet Take 20 mg by mouth daily.    . Verapamil HCl CR 300 MG CP24 TAKE 1 CAPSULE (300 MG TOTAL) BY MOUTH DAILY. PLEASE CALL TO SCHEDULE OVERDUE APPT WITH DR ROSS 90 capsule 1   No current facility-administered medications for this encounter.    Vitals:   05/03/20 0953  BP: (!) 142/80  Pulse: 66  SpO2: 99%  Weight: 110.7 kg (244 lb)   Wt Readings from Last 3 Encounters:  05/03/20 110.7 kg (244 lb)  05/01/20 108.9 kg (240 lb)  04/24/20 110 kg (242 lb 6.4 oz)   PHYSICAL EXAM: General:  Obese WF No respiratory difficulty but using [place] HEENT: normal Neck: supple. Thick neck, JVD assessment difficult. Carotids 2+ bilat; no bruits. No lymphadenopathy or thyromegaly appreciated. Cor: PMI nondisplaced. Regular rate & rhythm. No rubs, gallops or murmurs. Lungs: clear Abdomen: soft, nontender, nondistended. No hepatosplenomegaly. No bruits or masses. Good bowel sounds. Extremities: no cyanosis, clubbing, rash, Trace bilateral LE edema Neuro: alert & oriented x 3, cranial nerves grossly intact. moves all 4 extremities  w/o difficulty. Affect pleasant.   ASSESSMENT & PLAN:  1. Chronic biventricular HF due to NICM - Echo 9/20 EF 25-30% (new). RV severely HK  - Cath 8/20 with minimal CAD - Echo 1/21  EF 45-50% with mod RV dysfunction. Grade 2 DD. MV  is thickened with mild MR/MS - NYHA IIIb confounded by obesity and COPD - new supp O2 requirements since recent admit for a/c CHF + AKI. Remains on 2L [place] - seen in ED 2 days ago 5/25 and BNP elevated at 2500.  - With new O2 requirements and recent admission for CHF and ongoing problems w/ fluid overload, will plan RHC to reassess pulmonary artery and filling pressures. She had moderate PH by cath in 2018 and has not been compliant w/ CPAP. Also has COPD. Concerned her [place] has worsened. Will need to assess CO as well w/ recent worsening of renal function.  -  Plan to increase torsemide to 40 mg BID x 2 days, then reduce to 40 mg once daily - Check BMP today and again in 7 days.   - No longer on Entresto due to recent AKI  - Continue hydralazine 50 mg three times a day + 60 mg imdur.  - Has not been on b-blocker with wheezing - Continue Jardiance - Advised low sodium diet   2. Chronic hypoxic respiratory failure - Follows with Dr. Valeta Harms in Pulmonary - now on supp O2 - concern that her [place] has worsened  - needs to improve compliance w/ CPAP   3. PAH, mild to moderate - likely who GROUP II & III  - not candidate for selective pulmonary vasodilators - continue management of systolic HF per above and CPAP therapy for OSA. We discussed this in detail today and she will work to improve compliance - Will arrange for RHC w/ Dr. Haroldine Laws  - Once better diuresed, would benefit from repeat PFTs and V/Q scan   4. CKD Stage III - baseline creatinine 1.4-1.7 - recent AKI during hospitalization 4/1 w/ SCr spiking to 3.3. Improved to 1.5 on 5/25 - Entresto has been discontinued  - repeat BMP today   5. COPD - Quit smoking since 08/16/19.  - followed by pulmonology   6. Morbid obesity Body mass index is 44.63 kg/m. - encouraged increasing physical activity for wt loss  7. OSA - followed by Dr. Radford Pax - admits to poor compliance w/ CPAP - strongly encouraged that she starts to use this  nightly   8. DM2 - on SGLT2i  9. Palpitations=> SVT - Zio patch 1/21showed frequent SVT - Referred to EP. Dr. Lovena Le now following  - Plans to have SVT ablation, hopefully in June  - Encouraged to improve compliance w/ CPAP   Arrange RHC w/ Dr. Haroldine Laws. F/u 1-2 weeks post procedure.   Brittainy Simmons PA-C 10:41 AM

## 2020-05-03 NOTE — Patient Instructions (Signed)
Increase Torsemide to 40 mg (2 tabs) daily  Labs done today, your results will be available in MyChart, we will contact you for abnormal readings.  Labs needed in 1 week  Your physician recommends that you schedule a follow-up appointment in: 4-6 weeks  If you have any questions or concerns before your next appointment please send [place] a message through [place] or call our office at [phone].    TO LEAVE A MESSAGE FOR A NURSE SELECT OPTION 2, PLEASE LEAVE A MESSAGE INCLUDING: . YOUR NAME . DATE OF BIRTH . CALL BACK NUMBER . REASON FOR CALL  YOU WILL RECEIVE A CALL BACK THE SAME DAY AS LONG AS YOU CALL BEFORE 4:00 PM  At the [facility], you and your health needs are our priority. As part of our continuing mission to provide you with exceptional heart care, we have created designated Provider Care Teams. These Care Teams include your primary Cardiologist (physician) and Advanced Practice Providers (APPs- Physician Assistants and Nurse Practitioners) who all work together to provide you with the care you need, when you need it.   You may see any of the following providers on your designated Care Team at your next follow up: Marland Kitchen Dr Glori Bickers . Dr Loralie Champagne . Darrick Grinder, NP . Lyda Jester, PA . Audry Riles, PharmD   Please be sure to bring in all your medications bottles to every appointment.

## 2020-05-04 ENCOUNTER — Encounter (HOSPITAL_COMMUNITY): Payer: Self-pay

## 2020-05-04 DIAGNOSIS — G4733 Obstructive sleep apnea (adult) (pediatric): Secondary | ICD-10-CM | POA: Diagnosis not present

## 2020-05-04 DIAGNOSIS — E785 Hyperlipidemia, unspecified: Secondary | ICD-10-CM | POA: Diagnosis not present

## 2020-05-04 DIAGNOSIS — E1122 Type 2 diabetes mellitus with diabetic chronic kidney disease: Secondary | ICD-10-CM | POA: Diagnosis not present

## 2020-05-04 DIAGNOSIS — I129 Hypertensive chronic kidney disease with stage 1 through stage 4 chronic kidney disease, or unspecified chronic kidney disease: Secondary | ICD-10-CM | POA: Diagnosis not present

## 2020-05-04 DIAGNOSIS — I5082 Biventricular heart failure: Secondary | ICD-10-CM | POA: Diagnosis not present

## 2020-05-04 DIAGNOSIS — N184 Chronic kidney disease, stage 4 (severe): Secondary | ICD-10-CM | POA: Diagnosis not present

## 2020-05-04 DIAGNOSIS — I471 Supraventricular tachycardia: Secondary | ICD-10-CM | POA: Diagnosis not present

## 2020-05-08 ENCOUNTER — Ambulatory Visit: Payer: Medicare Other | Admitting: Podiatry

## 2020-05-10 ENCOUNTER — Other Ambulatory Visit: Payer: Self-pay

## 2020-05-10 ENCOUNTER — Ambulatory Visit (HOSPITAL_COMMUNITY)
Admission: RE | Admit: 2020-05-10 | Discharge: 2020-05-10 | Disposition: A | Discharge status: Home / Self Care | Payer: Medicare Other | Source: Ambulatory Visit | Attending: Cardiology | Admitting: Cardiology

## 2020-05-10 DIAGNOSIS — I5022 Chronic systolic (congestive) heart failure: Secondary | ICD-10-CM | POA: Diagnosis not present

## 2020-05-10 LAB — BASIC METABOLIC PANEL
Anion gap: 12 (ref 5–15)
BUN: 29 mg/dL — ABNORMAL HIGH (ref 8–23)
CO2: 32 mmol/L (ref 22–32)
Calcium: 8.6 mg/dL — ABNORMAL LOW (ref 8.9–10.3)
Chloride: 101 mmol/L (ref 98–111)
Creatinine, Ser: 1.99 mg/dL — ABNORMAL HIGH (ref 0.44–1.00)
GFR calc Af Amer: 30 mL/min — ABNORMAL LOW (ref >60)
GFR calc non Af Amer: 26 mL/min — ABNORMAL LOW (ref >60)
Glucose, Bld: 122 mg/dL — ABNORMAL HIGH (ref 70–99)
Potassium: 4.5 mmol/L (ref 3.5–5.1)
Sodium: 145 mmol/L (ref 135–145)

## 2020-05-17 DIAGNOSIS — I129 Hypertensive chronic kidney disease with stage 1 through stage 4 chronic kidney disease, or unspecified chronic kidney disease: Secondary | ICD-10-CM | POA: Diagnosis not present

## 2020-05-17 DIAGNOSIS — E1122 Type 2 diabetes mellitus with diabetic chronic kidney disease: Secondary | ICD-10-CM | POA: Diagnosis not present

## 2020-05-17 DIAGNOSIS — I5082 Biventricular heart failure: Secondary | ICD-10-CM | POA: Diagnosis not present

## 2020-05-17 DIAGNOSIS — N184 Chronic kidney disease, stage 4 (severe): Secondary | ICD-10-CM | POA: Diagnosis not present

## 2020-05-17 DIAGNOSIS — I471 Supraventricular tachycardia: Secondary | ICD-10-CM | POA: Diagnosis not present

## 2020-05-18 ENCOUNTER — Other Ambulatory Visit: Payer: Medicare Other | Admitting: *Deleted

## 2020-05-18 ENCOUNTER — Other Ambulatory Visit: Payer: Self-pay

## 2020-05-18 ENCOUNTER — Other Ambulatory Visit (HOSPITAL_COMMUNITY): Payer: Medicare Other

## 2020-05-18 DIAGNOSIS — I471 Supraventricular tachycardia: Secondary | ICD-10-CM | POA: Diagnosis not present

## 2020-05-18 LAB — CBC WITH DIFFERENTIAL/PLATELET
Basophils Absolute: 0 10*3/uL (ref 0.0–0.2)
Basos: 0 %
EOS (ABSOLUTE): 0.2 10*3/uL (ref 0.0–0.4)
Eos: 3 %
Hematocrit: 39.4 % (ref 34.0–46.6)
Hemoglobin: 12.3 g/dL (ref 11.1–15.9)
Immature Grans (Abs): 0 10*3/uL (ref 0.0–0.1)
Immature Granulocytes: 0 %
Lymphocytes Absolute: 1.7 10*3/uL (ref 0.7–3.1)
Lymphs: 23 %
MCH: 27.5 pg (ref 26.6–33.0)
MCHC: 31.2 g/dL — ABNORMAL LOW (ref 31.5–35.7)
MCV: 88 fL (ref 79–97)
Monocytes Absolute: 0.4 10*3/uL (ref 0.1–0.9)
Monocytes: 6 %
Neutrophils Absolute: 4.9 10*3/uL (ref 1.4–7.0)
Neutrophils: 68 %
Platelets: 207 10*3/uL (ref 150–450)
RBC: 4.47 x10E6/uL (ref 3.77–5.28)
RDW: 14.4 % (ref 11.7–15.4)
WBC: 7.2 10*3/uL (ref 3.4–10.8)

## 2020-05-18 LAB — BASIC METABOLIC PANEL
BUN/Creatinine Ratio: 21 (ref 12–28)
BUN: 37 mg/dL — ABNORMAL HIGH (ref 8–27)
CO2: 28 mmol/L (ref 20–29)
Calcium: 8.4 mg/dL — ABNORMAL LOW (ref 8.7–10.3)
Chloride: 100 mmol/L (ref 96–106)
Creatinine, Ser: 1.79 mg/dL — ABNORMAL HIGH (ref 0.57–1.00)
GFR calc Af Amer: 34 mL/min/{1.73_m2} — ABNORMAL LOW (ref >59)
GFR calc non Af Amer: 30 mL/min/{1.73_m2} — ABNORMAL LOW (ref >59)
Glucose: 105 mg/dL — ABNORMAL HIGH (ref 65–99)
Potassium: 4 mmol/L (ref 3.5–5.2)
Sodium: 144 mmol/L (ref 134–144)

## 2020-05-21 ENCOUNTER — Ambulatory Visit (HOSPITAL_COMMUNITY)
Admission: RE | Disposition: A | Discharge status: Home / Self Care | Payer: Self-pay | Source: Home / Self Care | Attending: Internal Medicine

## 2020-05-21 ENCOUNTER — Ambulatory Visit (HOSPITAL_COMMUNITY)
Admission: RE | Admit: 2020-05-21 | Discharge: 2020-05-21 | Disposition: A | Discharge status: Home / Self Care | Payer: Medicare Other | Attending: Internal Medicine | Admitting: Internal Medicine

## 2020-05-21 ENCOUNTER — Other Ambulatory Visit: Payer: Self-pay

## 2020-05-21 DIAGNOSIS — Z87891 Personal history of nicotine dependence: Secondary | ICD-10-CM | POA: Insufficient documentation

## 2020-05-21 DIAGNOSIS — Z79899 Other long term (current) drug therapy: Secondary | ICD-10-CM | POA: Diagnosis not present

## 2020-05-21 DIAGNOSIS — Z8673 Personal history of transient ischemic attack (TIA), and cerebral infarction without residual deficits: Secondary | ICD-10-CM | POA: Diagnosis not present

## 2020-05-21 DIAGNOSIS — Z6841 Body Mass Index (BMI) 40.0 and over, adult: Secondary | ICD-10-CM | POA: Insufficient documentation

## 2020-05-21 DIAGNOSIS — J449 Chronic obstructive pulmonary disease, unspecified: Secondary | ICD-10-CM | POA: Diagnosis not present

## 2020-05-21 DIAGNOSIS — I272 Pulmonary hypertension, unspecified: Secondary | ICD-10-CM | POA: Diagnosis not present

## 2020-05-21 DIAGNOSIS — Z886 Allergy status to analgesic agent status: Secondary | ICD-10-CM | POA: Diagnosis not present

## 2020-05-21 DIAGNOSIS — E785 Hyperlipidemia, unspecified: Secondary | ICD-10-CM | POA: Insufficient documentation

## 2020-05-21 DIAGNOSIS — I4891 Unspecified atrial fibrillation: Secondary | ICD-10-CM | POA: Insufficient documentation

## 2020-05-21 DIAGNOSIS — I110 Hypertensive heart disease with heart failure: Secondary | ICD-10-CM | POA: Insufficient documentation

## 2020-05-21 DIAGNOSIS — Z7982 Long term (current) use of aspirin: Secondary | ICD-10-CM | POA: Diagnosis not present

## 2020-05-21 DIAGNOSIS — I2510 Atherosclerotic heart disease of native coronary artery without angina pectoris: Secondary | ICD-10-CM | POA: Insufficient documentation

## 2020-05-21 DIAGNOSIS — Z888 Allergy status to other drugs, medicaments and biological substances status: Secondary | ICD-10-CM | POA: Diagnosis not present

## 2020-05-21 DIAGNOSIS — F419 Anxiety disorder, unspecified: Secondary | ICD-10-CM | POA: Insufficient documentation

## 2020-05-21 DIAGNOSIS — M1990 Unspecified osteoarthritis, unspecified site: Secondary | ICD-10-CM | POA: Diagnosis not present

## 2020-05-21 DIAGNOSIS — I5032 Chronic diastolic (congestive) heart failure: Secondary | ICD-10-CM | POA: Diagnosis not present

## 2020-05-21 DIAGNOSIS — E119 Type 2 diabetes mellitus without complications: Secondary | ICD-10-CM | POA: Insufficient documentation

## 2020-05-21 DIAGNOSIS — Z794 Long term (current) use of insulin: Secondary | ICD-10-CM | POA: Insufficient documentation

## 2020-05-21 DIAGNOSIS — I471 Supraventricular tachycardia: Secondary | ICD-10-CM | POA: Diagnosis not present

## 2020-05-21 DIAGNOSIS — E669 Obesity, unspecified: Secondary | ICD-10-CM | POA: Diagnosis not present

## 2020-05-21 DIAGNOSIS — Z8249 Family history of ischemic heart disease and other diseases of the circulatory system: Secondary | ICD-10-CM | POA: Diagnosis not present

## 2020-05-21 DIAGNOSIS — I6523 Occlusion and stenosis of bilateral carotid arteries: Secondary | ICD-10-CM | POA: Diagnosis not present

## 2020-05-21 DIAGNOSIS — Z833 Family history of diabetes mellitus: Secondary | ICD-10-CM | POA: Insufficient documentation

## 2020-05-21 DIAGNOSIS — Z885 Allergy status to narcotic agent status: Secondary | ICD-10-CM | POA: Diagnosis not present

## 2020-05-21 DIAGNOSIS — G4733 Obstructive sleep apnea (adult) (pediatric): Secondary | ICD-10-CM | POA: Insufficient documentation

## 2020-05-21 HISTORY — PX: SVT ABLATION: EP1225

## 2020-05-21 LAB — GLUCOSE, CAPILLARY
Glucose-Capillary: 143 mg/dL — ABNORMAL HIGH (ref 70–99)
Glucose-Capillary: 142 mg/dL — ABNORMAL HIGH (ref 70–99)

## 2020-05-21 SURGERY — SVT ABLATION

## 2020-05-21 SURGERY — A-FLUTTER ABLATION

## 2020-05-21 MED ORDER — SODIUM CHLORIDE 0.9 % IV SOLN: INTRAVENOUS | Status: DC

## 2020-05-21 MED ORDER — SODIUM CHLORIDE 0.9% FLUSH: 3.0000 mL | Freq: Two times a day (BID) | INTRAVENOUS | Status: DC

## 2020-05-21 MED ORDER — HEPARIN (PORCINE) IN NACL 1000-0.9 UT/500ML-% IV SOLN
INTRAVENOUS | Status: AC
  Filled 2020-05-21: qty 500

## 2020-05-21 MED ORDER — SODIUM CHLORIDE 0.9 % IV SOLN: 250.0000 mL | INTRAVENOUS | Status: DC | PRN

## 2020-05-21 MED ORDER — ONDANSETRON HCL 4 MG/2ML IJ SOLN: 4.0000 mg | Freq: Four times a day (QID) | INTRAMUSCULAR | Status: DC | PRN

## 2020-05-21 MED ORDER — METOPROLOL TARTRATE 5 MG/5ML IV SOLN
INTRAVENOUS | Status: AC
  Filled 2020-05-21: qty 5

## 2020-05-21 MED ORDER — BUPIVACAINE HCL (PF) 0.25 % IJ SOLN
INTRAMUSCULAR | Status: AC
  Filled 2020-05-21: qty 30

## 2020-05-21 MED ORDER — MIDAZOLAM HCL 5 MG/5ML IJ SOLN
INTRAMUSCULAR | Status: AC
  Filled 2020-05-21: qty 5

## 2020-05-21 MED ORDER — HEPARIN (PORCINE) IN NACL 1000-0.9 UT/500ML-% IV SOLN
INTRAVENOUS | Status: DC | PRN
  Administered 2020-05-21: 500 mL

## 2020-05-21 MED ORDER — METOPROLOL TARTRATE 5 MG/5ML IV SOLN
INTRAVENOUS | Status: DC | PRN
  Administered 2020-05-21: 5 mg via INTRAVENOUS

## 2020-05-21 MED ORDER — LIDOCAINE HCL (PF) 1 % IJ SOLN
INTRAMUSCULAR | Status: AC
  Filled 2020-05-21: qty 30

## 2020-05-21 MED ORDER — FENTANYL CITRATE (PF) 100 MCG/2ML IJ SOLN
INTRAMUSCULAR | Status: AC
  Filled 2020-05-21: qty 2

## 2020-05-21 MED ORDER — MIDAZOLAM HCL 5 MG/5ML IJ SOLN
INTRAMUSCULAR | Status: DC | PRN
  Administered 2020-05-21 (×3): 1 mg via INTRAVENOUS
  Administered 2020-05-21: 2 mg via INTRAVENOUS
  Administered 2020-05-21 (×2): 1 mg via INTRAVENOUS

## 2020-05-21 MED ORDER — FENTANYL CITRATE (PF) 100 MCG/2ML IJ SOLN
INTRAMUSCULAR | Status: DC | PRN
  Administered 2020-05-21: 12.5 ug via INTRAVENOUS
  Administered 2020-05-21: 25 ug via INTRAVENOUS
  Administered 2020-05-21: 12.5 ug via INTRAVENOUS
  Administered 2020-05-21: 25 ug via INTRAVENOUS
  Administered 2020-05-21: 12.5 ug via INTRAVENOUS

## 2020-05-21 MED ORDER — SODIUM CHLORIDE 0.9% FLUSH: 3.0000 mL | INTRAVENOUS | Status: DC | PRN

## 2020-05-21 MED ORDER — BUPIVACAINE HCL (PF) 0.25 % IJ SOLN
INTRAMUSCULAR | Status: DC | PRN
  Administered 2020-05-21: 30 mL

## 2020-05-21 SURGICAL SUPPLY — 11 items
BAG SNAP BAND KOVER 36X36 (MISCELLANEOUS) ×3 IMPLANT
CATH DUODECA HALO/ISMUS 7FR (CATHETERS) ×3 IMPLANT
CATH HEX JOSEPH 2-5-2 65CM 6F (CATHETERS) ×6 IMPLANT
CATH JOSEPH QUAD ALLRED 6F REP (CATHETERS) ×6 IMPLANT
PACK EP LATEX FREE (CUSTOM PROCEDURE TRAY) ×3
PACK EP LF (CUSTOM PROCEDURE TRAY) ×1 IMPLANT
PAD PRO RADIOLUCENT 2001M-C (PAD) ×3 IMPLANT
PATCH CARTO3 (PAD) ×3 IMPLANT
SHEATH PINNACLE 6F 10CM (SHEATH) ×6 IMPLANT
SHEATH PINNACLE 7F 10CM (SHEATH) ×3 IMPLANT
SHEATH PINNACLE 8F 10CM (SHEATH) ×6 IMPLANT

## 2020-05-21 NOTE — Progress Notes (Signed)
Discharge instructions reviewed with pt and Baker Janus (via telephone) both voice understanding.

## 2020-05-21 NOTE — Progress Notes (Signed)
Site area: rt IJ venous sheath Site Prior to Removal:  Level 0 Pressure Applied For: 10 minutes Manual:   yes Patient Status During Pull:  stable Post Pull Site:  Level 0 Post Pull Instructions Given:  yes Post Pull Pulses Present: NA Dressing Applied:  Petroleum gauze, gauze and tegaderm Bedrest begins @  Comments:   

## 2020-05-21 NOTE — H&P (Signed)
HPI [name] is referred by Dr. Reine Just for consideration for eval of SVT. She is a pleasant mobidly obese 64 yo woman with a non-ischemic CM who experienced palpitations and a Zio patch demonstrated recurrent episodes of sustained SVT, which appears to be a longish short RP tachycardia. She thinks that her symptoms have been present for several months but she denies a long h/o palpitations or SVT. When she wore the cardiac monitor she also had VT, NS. She has not had syncope Her EF is 45% by echo, previously worse. Allergies  Allergen Reactions  . Atorvastatin Other (See Comments) and Nausea And Vomiting    "legs cramped; moderate to almost severe" "legs cramped; moderate to almost severe"  . Ibuprofen Other (See Comments)    Other reaction(s): Other (See Comments) Per pt, MD doesn't want pt to take Per pt, MD doesn't want pt to take  . Metformin And Related Nausea And Vomiting  . Ace Inhibitors Cough and Nausea And Vomiting  . Hydrocodone-Acetaminophen Nausea And Vomiting  . Albuterol Other (See Comments)    Caused SVT           Current Outpatient Medications  Medication Sig Dispense Refill  . acetaminophen (TYLENOL) 325 MG tablet Take 2 tablets (650 mg total) by mouth every 6 (six) hours as needed for mild pain. 90 tablet 2  . albuterol (VENTOLIN HFA) 108 (90 Base) MCG/ACT inhaler TAKE 2 PUFFS BY MOUTH EVERY 6 HOURS AS NEEDED FOR WHEEZE OR SHORTNESS OF BREATH 18 g 1  . allopurinol (ZYLOPRIM) 100 MG tablet Take 100 mg by mouth daily.     Marland Kitchen aspirin EC 81 MG tablet Take 1 tablet (81 mg total) by mouth daily. 90 tablet 3  . benzonatate (TESSALON) 100 MG capsule Take 1 capsule (100 mg total) by mouth every 8 (eight) hours. 21 capsule 0  . Blood Glucose Monitoring Suppl (ONETOUCH VERIO) w/Device KIT USE TO CHECK BLOOD GLUCOSE 4 TIMES A DAY . DIAGNOSIS: TYPE 2 DIABETES E11.59 1 kit 0  . busPIRone (BUSPAR) 15 MG tablet Take 15 mg by mouth 2 (two) times daily.     .  CHANTIX STARTING MONTH PAK 0.5 MG X 11 & 1 MG X 42 tablet TAKE AS DIRECTED ON PACKAGE 53 each 0  . doxycycline (VIBRA-TABS) 100 MG tablet Take 1 tablet (100 mg total) by mouth 2 (two) times daily. 20 tablet 0  . empagliflozin (JARDIANCE) 10 MG TABS tablet Take 10 mg by mouth daily before breakfast. 30 tablet 11  . FLUoxetine (PROZAC) 20 MG capsule TAKE 1 CAPSULE (20 MG TOTAL) BY MOUTH AT BEDTIME. 90 capsule 2  . gabapentin (NEURONTIN) 400 MG capsule Take 1 capsule (400 mg total) by mouth at bedtime.    Marland Kitchen glucose blood (ONETOUCH VERIO) test strip For E11.22.  Use to check glucose qid 100 strip 12  . hydrALAZINE (APRESOLINE) 50 MG tablet Take 1 tablet (50 mg total) by mouth every 8 (eight) hours. 270 tablet 3  . insulin aspart (NOVOLOG FLEXPEN) 100 UNIT/ML FlexPen Inject 5 Units into the skin 3 (three) times daily with meals. Dx: E11.9 15 mL   . Insulin Glargine 300 UNIT/ML SOPN Inject 60 Units into the skin daily.    . isosorbide mononitrate (IMDUR) 60 MG 24 hr tablet TAKE 1 TABLET (60 MG TOTAL) BY MOUTH DAILY FOR 30 DAYS. ** DO NOT CRUSH ** 90 tablet 0  . ketorolac (ACULAR) 0.5 % ophthalmic solution     . nitroGLYCERIN (  NITROSTAT) 0.4 MG SL tablet Place 1 tablet (0.4 mg total) under the tongue every 5 (five) minutes as needed. 25 tablet 3  . ofloxacin (OCUFLOX) 0.3 % ophthalmic solution     . omeprazole (PRILOSEC) 40 MG capsule TAKE 1 CAPSULE BY MOUTH 30- 60 MIN BEFORE YOUR FIRST AND LAST MEALS OF THE DAY 180 capsule 0  . potassium chloride SA (KLOR-CON M20) 20 MEQ tablet TAKE 1 TABLET DAILY ONLY WHEN DIRECTED BY THE HEART FAILURE CLINIC 90 tablet 1  . prednisoLONE acetate (PRED FORTE) 1 % ophthalmic suspension     . sacubitril-valsartan (ENTRESTO) 24-26 MG Take 1 tablet by mouth 2 (two) times daily. 60 tablet 6  . topiramate (TOPAMAX) 25 MG tablet TAKE 1 TABLET BY MOUTH TWICE A DAY 180 tablet 1  . torsemide (DEMADEX) 20 MG tablet TAKE 1 TABLET (20 MG TOTAL) BY MOUTH EVERY MONDAY,  WEDNESDAY, AND FRIDAY. 15 tablet 0  . Verapamil HCl CR 300 MG CP24     . rosuvastatin (CRESTOR) 20 MG tablet Take 1 tablet (20 mg total) by mouth daily. 90 tablet 2   No current facility-administered medications for this visit.         Past Medical History:  Diagnosis Date  . Anxiety    doesn't take any meds for this  . Arteriosclerosis of coronary artery 08/31/2011   Myoview 9/12: EF 44, no ischemia // Myoview 8/18: EF 57, low risk, possible small area of inferior ischemia // Minor nonobstructive CAD by cardiac catheterization - [facility] 8/18: Proximal LAD 35, OM2 25  . Arthritis    knuckles  . Asthma   . Carotid artery disease ([place])    Carotid US 7/14: Bilateral ICA 40-59 // Carotid US ([facility]) 2/17: bilat plaque without sig stenosis  . Chronic diastolic CHF (congestive heart failure) ([place]) 07/26/2017   Echo 7/18:Severe conc LVH, EF 60-65, no RWMA, Gr 2 DD, MAC, mild MR // right and left heart cath 8/18: LVEDP 21  . Claustrophobia 07/26/2012  . COPD (chronic obstructive pulmonary disease) ([place])    "chronic bronchitis - about every winter"  . CSF leak    dizziness/headache - assoc symptoms // 2/2 encephalocele s/p skull base reconstruction  . Diverticulosis   . History of CVA (cerebrovascular accident) 03/2004   denies residual 07/26/2012  . Hx of gout   . Hyperlipidemia    takes Pravastatin daily  . Hypertensive heart disease with chronic diastolic congestive heart failure ([place]) 07/26/2017  . Internal hemorrhoids   . Kidney stones   . LVH (left ventricular hypertrophy)    Echo 8/18: Severe conc LVH, EF 60-65, no RWMA, Gr 2 DD, MAC, mild MR  . OSA (obstructive sleep apnea) 07/26/2012   prior CPAP - stopped due to being primary caregiver for dying parent (stopped in 2016)  . PSVT (paroxysmal supraventricular tachycardia) ([place])   . Pulmonary hypertension, unspecified ([place]) 08/14/2017   cath 07/2017 >>PASP 13m Hg, PA mean 39 mm Hg  . Type II diabetes  mellitus ([facility])     ROS:   All systems reviewed and negative except as noted in the HPI.        Past Surgical History:  Procedure Laterality Date  . ESOPHAGOGASTRODUODENOSCOPY (EGD) WITH PROPOFOL N/A 07/13/2015   Procedure: ESOPHAGOGASTRODUODENOSCOPY (EGD) WITH PROPOFOL;  Surgeon: PCarol Ada MD;  Location: [facility];  Service: Endoscopy;  Laterality: N/A;  . NASAL SINUS SURGERY  01/29/2012   Procedure: ENDOSCOPIC SINUS SURGERY WITH STEALTH;  Surgeon: MRuby Cola MD;  [facility];  Service: ENT;  Laterality: N/A;  . NM MYOVIEW LTD  08/25/2011   Low risk study, no evidence of ischemia, EF 44%  . REFRACTIVE SURGERY  ~ 2010   left  . RIGHT/LEFT HEART CATH AND CORONARY ANGIOGRAPHY N/A 07/30/2017   Procedure: RIGHT/LEFT HEART CATH AND CORONARY ANGIOGRAPHY;  Surgeon: Martinique, Peter M, MD;  Location: [facility];  Service: Cardiovascular;  Laterality: N/A;  . SPHENOIDECTOMY  01/29/2012   Procedure: SPHENOIDECTOMY;  Surgeon: Ruby Cola, MD;  Location: [facility];  Service: ENT;  Laterality: Left;  REPAIR OF LEFT SPHENOID    . TOTAL ABDOMINAL HYSTERECTOMY  1994          Family History  Problem Relation Age of Onset  . Colon cancer Sister 80  . Breast cancer Mother 11  . Alzheimer's disease Mother   . Diabetes Sister 28  . Heart failure Sister   . Diabetes Sister 87  . Lung cancer Father   . Breast cancer Sister   . Cancer Sister        mouth  . Anesthesia problems Neg Hx   . Hypotension Neg Hx   . Malignant hyperthermia Neg Hx   . Pseudochol deficiency Neg Hx      Social History        Socioeconomic History  . Marital status: Single    Spouse name: Not on file  . Number of children: 0  . Years of education: Not on file  . Highest education level: Not on file  Occupational History  . Occupation: disabled  Tobacco Use  . Smoking status: Former Smoker    Packs/day: 0.50    Years: 35.00    Pack years: 17.50     Types: Cigarettes    Quit date: 08/16/2019    Years since quitting: 0.6  . Smokeless tobacco: Never Used  Substance and Sexual Activity  . Alcohol use: Yes    Alcohol/week: 1.0 standard drinks    Types: 1 Glasses of wine per week    Comment: rarely  . Drug use: No  . Sexual activity: Never    Birth control/protection: Surgical  Other Topics Concern  . Not on file  Social History Narrative   Currently unemployed.   No children, not married   Social Determinants of Scientist, physiological Strain:   . Difficulty of Paying Living Expenses:   Food Insecurity:   . Worried About Charity fundraiser in the Last Year:   . Arboriculturist in the Last Year:   Transportation Needs:   . Film/video editor (Medical):   Marland Kitchen Lack of Transportation (Non-Medical):   Physical Activity:   . Days of Exercise per Week:   . Minutes of Exercise per Session:   Stress:   . Feeling of Stress :   Social Connections:   . Frequency of Communication with Friends and Family:   . Frequency of Social Gatherings with Friends and Family:   . Attends Religious Services:   . Active Member of Clubs or Organizations:   . Attends Archivist Meetings:   Marland Kitchen Marital Status:   Intimate Partner Violence:   . Fear of Current or Ex-Partner:   . Emotionally Abused:   Marland Kitchen Physically Abused:   . Sexually Abused:      BP 138/68   Pulse 77   Ht _0  (1.6 m)   Wt 233 lb 9.6 oz (106 kg)   SpO2 93%  BMI 41.38 kg/m   Physical Exam:  Well appearing NAD HEENT: Unremarkable Neck:  No JVD, no thyromegally Lymphatics:  No adenopathy Back:  No CVA tenderness Lungs:  Clear with no wheezes HEART:  Regular rate rhythm, no murmurs, no rubs, no clicks Abd:  soft, positive bowel sounds, no organomegally, no rebound, no guarding Ext:  2 plus pulses, no edema, no cyanosis, no clubbing Skin:  No rashes no nodules Neuro:  CN II through XII intact, motor grossly intact  EKG - NSR  with poor R wave progression.  DEVICE  Normal device function.  See PaceArt for details.   Assess/Plan: 1. SVT - I have discussed the treatment options with the patient and the risks/benefits/goals/expectations of EPS/catheter ablation were reviewed and she wishes to proceed. 2. Obesity - at some point, she will need to work on losing weight. 3. HTN - her SBP is up a bit. She will continue her current meds. She needs to reduce salt and calories.  Ponciano Ort.  EP Attending  Patient seen and examined. Agree with above. No change since her clinic visit.   Mikle Bosworth.D.

## 2020-05-21 NOTE — Discharge Instructions (Signed)
Post procedure care instructions No driving for 4 days. No lifting over 5 lbs for 1 week. No vigorous or sexual activity for 1 week. You may return to work/your usual pre-procedure activities on 05/28/2020. Keep procedure site clean & dry. If you notice increased pain, swelling, bleeding or pus, call/return!  You may shower, but no soaking baths/hot tubs/pools for 1 week.      Cardiac Ablation, Care After This sheet gives you information about how to care for yourself after your procedure. Your health care provider may also give you more specific instructions. If you have problems or questions, contact your health care provider. What can I expect after the procedure? After the procedure, it is common to have:  Bruising around your puncture site.  Tenderness around your puncture site.  Skipped heartbeats.  Tiredness (fatigue). Follow these instructions at home: Puncture site care   Follow instructions from your health care provider about how to take care of your puncture site. Make sure you: ? Wash your hands with soap and water before you change your bandage (dressing). If soap and water are not available, use hand sanitizer. ? Change your dressing as told by your health care provider. ? Leave stitches (sutures), skin glue, or adhesive strips in place. These skin closures may need to stay in place for up to 2 weeks. If adhesive strip edges start to loosen and curl up, you may trim the loose edges. Do not remove adhesive strips completely unless your health care provider tells you to do that.  Check your puncture site every day for signs of infection. Check for: ? Redness, swelling, or pain. ? Fluid or blood. If your puncture site starts to bleed, lie down on your back, apply firm pressure to the area, and contact your health care provider. ? Warmth. ? Pus or a bad smell. Driving  Ask your health care provider when it is safe for you to drive again after the procedure.  Do not drive  or use heavy machinery while taking prescription pain medicine.  Do not drive for 24 hours if you were given a medicine to help you relax (sedative) during your procedure. Activity  Avoid activities that take a lot of effort for at least 3 days after your procedure.  Do not lift anything that is heavier than 10 lb (4.5 kg), or the limit that you are told, until your health care provider says that it is safe.  Return to your normal activities as told by your health care provider. Ask your health care provider what activities are safe for you. General instructions  Take over-the-counter and prescription medicines only as told by your health care provider.  Do not use any products that contain nicotine or tobacco, such as cigarettes and e-cigarettes. If you need help quitting, ask your health care provider.  Do not take baths, swim, or use a hot tub until your health care provider approves.  Do not drink alcohol for 24 hours after your procedure.  Keep all follow-up visits as told by your health care provider. This is important. Contact a health care provider if:  You have redness, mild swelling, or pain around your puncture site.  You have fluid or blood coming from your puncture site that stops after applying firm pressure to the area.  Your puncture site feels warm to the touch.  You have pus or a bad smell coming from your puncture site.  You have a fever.  You have chest pain or discomfort that  spreads to your neck, jaw, or arm.  You are sweating a lot.  You feel nauseous.  You have a fast or irregular heartbeat.  You have shortness of breath.  You are dizzy or light-headed and feel the need to lie down.  You have pain or numbness in the arm or leg closest to your puncture site. Get help right away if:  Your puncture site suddenly swells.  Your puncture site is bleeding and the bleeding does not stop after applying firm pressure to the area. These symptoms may  represent a serious problem that is an emergency. Do not wait to see if the symptoms will go away. Get medical help right away. Call your local emergency services (911 in the [place] Do not drive yourself to the hospital. Summary  After the procedure, it is normal to have bruising and tenderness at the puncture site in your groin, neck, or forearm.  Check your puncture site every day for signs of infection.  Get help right away if your puncture site is bleeding and the bleeding does not stop after applying firm pressure to the area. This is a medical emergency. This information is not intended to replace advice given to you by your health care provider. Make sure you discuss any questions you have with your health care provider. Document Revised: 11/06/2017 Document Reviewed: 03/05/2017 Elsevier Patient Education  [place].

## 2020-05-22 ENCOUNTER — Encounter (HOSPITAL_COMMUNITY): Payer: Self-pay | Admitting: Internal Medicine

## 2020-05-22 MED FILL — Bupivacaine HCl Preservative Free (PF) Inj 0.25%: INTRAMUSCULAR | Qty: 30 | Status: AC

## 2020-05-25 ENCOUNTER — Telehealth (HOSPITAL_COMMUNITY): Payer: Self-pay | Admitting: *Deleted

## 2020-05-25 NOTE — Telephone Encounter (Signed)
Harvie Junior, [facility]  05/25/2020 10:49 AM EDT Back to Top    Left VM for pt to return call to schedule RHC.    Marsh Dolly [place], RN  05/08/2020 2:20 PM EDT     Spoke w/pt, she is aware of need for RHC and is agreeable to proceed however she is concerned about the timing of it. She states the nephrologist was concerned about her fluid and adjusted medications and she is supposed to see them back next week. Advised I will reach out to them to get their note, discuss with Dr Haroldine Laws timing of [place] and call her back. Called [facility] they state note is not complete at this time, they will fax when complete   Consuelo Pandy, PA-C  05/03/2020 7:15 PM EDT     Her SCr has increased again. K level ok. She should still increase her diuretics as we discussed today for fluid overload, but lets go ahead and arrange RHC to reassess pulmonary pressures and cardiac output. Since Dr. Haroldine Laws will be on vacation, can see if Dr. Aundra Dubin can do next week. She is also seeing her nephrologist tomorrow. Can you f/u w/ [facility] to see if they can fax note when done?

## 2020-05-25 NOTE — Telephone Encounter (Signed)
-----   Message from [name], [place] sent at 05/03/2020  7:15 PM EDT ----- Her SCr has increased again. K level ok. She should still increase her diuretics as we discussed today for fluid overload, but lets go ahead and arrange RHC to reassess pulmonary pressures and cardiac output. Since Dr. Haroldine Laws will be on vacation, can see if Dr. Aundra Dubin can do next week. She is also seeing her nephrologist tomorrow. Can you f/u w/ [facility] to see if they can fax note when done?

## 2020-06-07 ENCOUNTER — Encounter (HOSPITAL_COMMUNITY): Payer: Medicare Other

## 2020-06-12 ENCOUNTER — Other Ambulatory Visit: Payer: Self-pay | Admitting: Nurse Practitioner

## 2020-06-19 ENCOUNTER — Telehealth (HOSPITAL_COMMUNITY): Payer: Self-pay | Admitting: Cardiology

## 2020-06-19 DIAGNOSIS — I509 Heart failure, unspecified: Secondary | ICD-10-CM

## 2020-06-19 NOTE — Addendum Note (Signed)
Addended by: Kerry Dory on: 06/19/2020 09:39 AM   Modules accepted: Orders

## 2020-06-19 NOTE — Telephone Encounter (Signed)
[place] rescheduled for 7/23 @1030 , patient arrival 37  See instruction letter for details  Patient aware

## 2020-06-19 NOTE — Telephone Encounter (Signed)
Opened in error

## 2020-06-20 ENCOUNTER — Ambulatory Visit (INDEPENDENT_AMBULATORY_CARE_PROVIDER_SITE_OTHER): Payer: Medicare Other | Admitting: Internal Medicine

## 2020-06-20 ENCOUNTER — Other Ambulatory Visit: Payer: Self-pay

## 2020-06-20 ENCOUNTER — Telehealth: Payer: Self-pay | Admitting: Internal Medicine

## 2020-06-20 ENCOUNTER — Encounter: Payer: Self-pay | Admitting: Internal Medicine

## 2020-06-20 VITALS — BP 134/80 | HR 77 | Ht 62.0 in | Wt 245.0 lb

## 2020-06-20 DIAGNOSIS — I471 Supraventricular tachycardia: Secondary | ICD-10-CM

## 2020-06-20 MED ORDER — FLECAINIDE ACETATE 50 MG PO TABS: 50.0000 mg | ORAL_TABLET | Freq: Two times a day (BID) | ORAL | 3 refills | Status: AC

## 2020-06-20 MED ORDER — APIXABAN 5 MG PO TABS: 5.0000 mg | ORAL_TABLET | Freq: Two times a day (BID) | ORAL | 11 refills | Status: AC

## 2020-06-20 NOTE — Telephone Encounter (Signed)
Jonni Sanger from [facility] is calling to get med clarification. He stated that the pharmacy received the prescription on flecainide (TAMBOCOR) 50 MG tablet, but the patient is already on FLUoxetine (PROZAC) 20 MG capsule. He said that they interact, which causes an increase in the QT intervals. Please advise.

## 2020-06-20 NOTE — Patient Instructions (Addendum)
Medication Instructions:  Your physician has recommended you make the following change in your medication:  1 Stop Aspirin 2 Start Eliquis  (5mg )  Take one tablet by mouth twice a day 3 Start Flecainide (50mg )  Take one tablet by mouth twice a day Lab Work: None ordered. If you have labs (blood work) drawn today and your tests are completely normal, you will receive your results only by: Marland Kitchen MyChart Message (if you have MyChart) OR . A paper copy in the mail If you have any lab test that is abnormal or we need to change your treatment, we will call you to review the results. Testing/Procedures: None ordered. Follow-Up: At [facility], you and your health needs are our priority.  As part of our continuing mission to provide you with exceptional heart care, we have created designated Provider Care Teams.  These Care Teams include your primary Cardiologist (physician) and Advanced Practice Providers (APPs -  Physician Assistants and Nurse Practitioners) who all work together to provide you with the care you need, when you need it.  We recommend signing up for the patient portal called "MyChart".  Sign up information is provided on this After Visit Summary.  MyChart is used to connect with patients for Virtual Visits (Telemedicine).  Patients are able to view lab/test results, encounter notes, upcoming appointments, etc.  Non-urgent messages can be sent to your provider as well.   To learn more about what you can do with MyChart, go to [email or web address].    Your next appointment:   Your physician wants you to follow-up in: 6 weeks with Dr. Lovena Le.  08/08/2020 at 215pm at the [address] office Other Instructions: Apixaban oral tablets What is this medicine? APIXABAN (a PIX a ban) is an anticoagulant (blood thinner). It is used to lower the chance of stroke in people with a medical condition called atrial fibrillation. It is also used to treat or prevent blood clots in the lungs or in the  veins. This medicine may be used for other purposes; ask your health care provider or pharmacist if you have questions. COMMON BRAND NAME(S): Eliquis What should I tell my health care provider before I take this medicine? They need to know if you have any of these conditions:  antiphospholipid antibody syndrome  bleeding disorders  bleeding in the brain  blood in your stools (black or tarry stools) or if you have blood in your vomit  history of blood clots  history of stomach bleeding  kidney disease  liver disease  mechanical heart valve  an unusual or allergic reaction to apixaban, other medicines, foods, dyes, or preservatives  pregnant or trying to get pregnant  breast-feeding How should I use this medicine? Take this medicine by mouth with a glass of water. Follow the directions on the prescription label. You can take it with or without food. If it upsets your stomach, take it with food. Take your medicine at regular intervals. Do not take it more often than directed. Do not stop taking except on your doctor's advice. Stopping this medicine may increase your risk of a blood clot. Be sure to refill your prescription before you run out of medicine. Talk to your pediatrician regarding the use of this medicine in children. Special care may be needed. Overdosage: If you think you have taken too much of this medicine contact a poison control center or emergency room at once. NOTE: This medicine is only for you. Do not share this medicine with others. What if  I miss a dose? If you miss a dose, take it as soon as you can. If it is almost time for your next dose, take only that dose. Do not take double or extra doses. What may interact with this medicine? This medicine may interact with the following:  aspirin and aspirin-like medicines  certain medicines for fungal infections like ketoconazole and itraconazole  certain medicines for seizures like carbamazepine and  phenytoin  certain medicines that treat or prevent blood clots like warfarin, enoxaparin, and dalteparin  clarithromycin  NSAIDs, medicines for pain and inflammation, like ibuprofen or naproxen  rifampin  ritonavir  St. John's wort This list may not describe all possible interactions. Give your health care provider a list of all the medicines, herbs, non-prescription drugs, or dietary supplements you use. Also tell them if you smoke, drink alcohol, or use illegal drugs. Some items may interact with your medicine. What should I watch for while using this medicine? Visit your healthcare professional for regular checks on your progress. You may need blood work done while you are taking this medicine. Your condition will be monitored carefully while you are receiving this medicine. It is important not to miss any appointments. Avoid sports and activities that might cause injury while you are using this medicine. Severe falls or injuries can cause unseen bleeding. Be careful when using sharp tools or knives. Consider using an Copy. Take special care brushing or flossing your teeth. Report any injuries, bruising, or red spots on the skin to your healthcare professional. If you are going to need surgery or other procedure, tell your healthcare professional that you are taking this medicine. Wear a medical ID bracelet or chain. Carry a card that describes your disease and details of your medicine and dosage times. What side effects may I notice from receiving this medicine? Side effects that you should report to your doctor or health care professional as soon as possible:  allergic reactions like skin rash, itching or hives, swelling of the face, lips, or tongue  signs and symptoms of bleeding such as bloody or black, tarry stools; red or dark-brown urine; spitting up blood or brown material that looks like coffee grounds; red spots on the skin; unusual bruising or bleeding from the eye,  gums, or nose  signs and symptoms of a blood clot such as chest pain; shortness of breath; pain, swelling, or warmth in the leg  signs and symptoms of a stroke such as changes in vision; confusion; trouble speaking or understanding; severe headaches; sudden numbness or weakness of the face, arm or leg; trouble walking; dizziness; loss of coordination This list may not describe all possible side effects. Call your doctor for medical advice about side effects. You may report side effects to FDA at 1-800-FDA-1088. Where should I keep my medicine? Keep out of the reach of children. Store at room temperature between 20 and 25 degrees C (68 and 77 degrees F). Throw away any unused medicine after the expiration date. NOTE: This sheet is a summary. It may not cover all possible information. If you have questions about this medicine, talk to your doctor, pharmacist, or health care provider.  2020 Elsevier/Gold Standard (2018-08-04 17:39:34)  Flecainide tablets What is this medicine? FLECAINIDE (FLEK a nide) is an antiarrhythmic drug. This medicine is used to prevent irregular heart rhythm. It can also slow down fast heartbeats called tachycardia. This medicine may be used for other purposes; ask your health care provider or pharmacist if you have questions.  COMMON BRAND NAME(S): Tambocor What should I tell my health care provider before I take this medicine? They need to know if you have any of these conditions:  abnormal levels of potassium in the blood  heart disease including heart rhythm and heart rate problems  kidney or liver disease  recent heart attack  an unusual or allergic reaction to flecainide, local anesthetics, other medicines, foods, dyes, or preservatives  pregnant or trying to get pregnant  breast-feeding How should I use this medicine? Take this medicine by mouth with a glass of water. Follow the directions on the prescription label. You can take this medicine with or  without food. Take your doses at regular intervals. Do not take your medicine more often than directed. Do not stop taking this medicine suddenly. This may cause serious, heart-related side effects. If your doctor wants you to stop the medicine, the dose may be slowly lowered over time to avoid any side effects. Talk to your pediatrician regarding the use of this medicine in children. While this drug may be prescribed for children as young as 1 year of age for selected conditions, precautions do apply. Overdosage: If you think you have taken too much of this medicine contact a poison control center or emergency room at once. NOTE: This medicine is only for you. Do not share this medicine with others. What if I miss a dose? If you miss a dose, take it as soon as you can. If it is almost time for your next dose, take only that dose. Do not take double or extra doses. What may interact with this medicine? Do not take this medicine with any of the following medications:  amoxapine  arsenic trioxide  certain antibiotics like clarithromycin, erythromycin, gatifloxacin, gemifloxacin, levofloxacin, moxifloxacin, sparfloxacin, or troleandomycin  certain antidepressants called tricyclic antidepressants like amitriptyline, imipramine, or nortriptyline  certain medicines to control heart rhythm like disopyramide, encainide, moricizine, procainamide, propafenone, and quinidine  cisapride  delavirdine  droperidol  haloperidol  hawthorn  imatinib  levomethadyl  maprotiline  medicines for malaria like chloroquine and halofantrine  pentamidine  phenothiazines like chlorpromazine, mesoridazine, prochlorperazine, thioridazine  pimozide  quinine  ranolazine  ritonavir  sertindole This medicine may also interact with the following medications:  cimetidine  dofetilide  medicines for angina or high blood pressure  medicines to control heart rhythm like amiodarone and  digoxin  ziprasidone This list may not describe all possible interactions. Give your health care provider a list of all the medicines, herbs, non-prescription drugs, or dietary supplements you use. Also tell them if you smoke, drink alcohol, or use illegal drugs. Some items may interact with your medicine. What should I watch for while using this medicine? Visit your doctor or health care professional for regular checks on your progress. Because your condition and the use of this medicine carries some risk, it is a good idea to carry an identification card, necklace or bracelet with details of your condition, medications and doctor or health care professional. Check your blood pressure and pulse rate regularly. Ask your health care professional what your blood pressure and pulse rate should be, and when you should contact him or her. Your doctor or health care professional also may schedule regular blood tests and electrocardiograms to check your progress. You may get drowsy or dizzy. Do not drive, use machinery, or do anything that needs mental alertness until you know how this medicine affects you. Do not stand or sit up quickly, especially if you are an  older patient. This reduces the risk of dizzy or fainting spells. Alcohol can make you more dizzy, increase flushing and rapid heartbeats. Avoid alcoholic drinks. What side effects may I notice from receiving this medicine? Side effects that you should report to your doctor or health care professional as soon as possible:  chest pain, continued irregular heartbeats  difficulty breathing  swelling of the legs or feet  trembling, shaking  unusually weak or tired Side effects that usually do not require medical attention (report to your doctor or health care professional if they continue or are bothersome):  blurred vision  constipation  headache  nausea, vomiting  stomach pain This list may not describe all possible side effects. Call  your doctor for medical advice about side effects. You may report side effects to FDA at 1-800-FDA-1088. Where should I keep my medicine? Keep out of the reach of children. Store at room temperature between 15 and 30 degrees C (59 and 86 degrees F). Protect from light. Keep container tightly closed. Throw away any unused medicine after the expiration date. NOTE: This sheet is a summary. It may not cover all possible information. If you have questions about this medicine, talk to your doctor, pharmacist, or health care provider.  2020 Elsevier/Gold Standard (2018-11-15 11:41:38)

## 2020-06-20 NOTE — Telephone Encounter (Signed)
Returned call to Pharmacist.    Advised ok to fill flecainide

## 2020-06-20 NOTE — Progress Notes (Signed)
HPI [name] returns today for followup. She initially was thought to have SVT but was noted to only have inducible Atrial fib and EP study. She has continue to have recurrent palpitations. She has been on verapamil. I did not know what to make of inducible Atrial fib at EP study.  Allergies  Allergen Reactions  . Atorvastatin Other (See Comments) and Nausea And Vomiting    "legs cramped; moderate to almost severe" "legs cramped; moderate to almost severe"  . Ibuprofen Other (See Comments)    Other reaction(s): Other (See Comments) Per pt, MD doesn't want pt to take Per pt, MD doesn't want pt to take  . Metformin And Related Nausea And Vomiting  . Ace Inhibitors Cough and Nausea And Vomiting  . Hydrocodone-Acetaminophen Nausea And Vomiting  . Albuterol Other (See Comments)    Caused SVT, but pt is currently taking it.      Current Outpatient Medications  Medication Sig Dispense Refill  . acetaminophen (TYLENOL) 325 MG tablet Take 2 tablets (650 mg total) by mouth every 6 (six) hours as needed for mild pain. 90 tablet 2  . albuterol (VENTOLIN HFA) 108 (90 Base) MCG/ACT inhaler TAKE 2 PUFFS BY MOUTH EVERY 6 HOURS AS NEEDED FOR WHEEZE OR SHORTNESS OF BREATH 18 g 1  . allopurinol (ZYLOPRIM) 100 MG tablet Take 100 mg by mouth daily as needed (gout).     Marland Kitchen aspirin EC 81 MG tablet Take 1 tablet (81 mg total) by mouth daily. 90 tablet 3  . benzonatate (TESSALON) 100 MG capsule Take 1 capsule (100 mg total) by mouth every 8 (eight) hours. 21 capsule 0  . Blood Glucose Monitoring Suppl (ONETOUCH VERIO) w/Device KIT USE TO CHECK BLOOD GLUCOSE 4 TIMES A DAY . DIAGNOSIS: TYPE 2 DIABETES E11.59 1 kit 0  . fenofibrate 54 MG tablet Take 1 tablet (54 mg total) by mouth daily. 90 tablet 3  . FLUoxetine (PROZAC) 20 MG capsule Take 1 capsule (20 mg total) by mouth at bedtime. 90 capsule 3  . gabapentin (NEURONTIN) 400 MG capsule Take 1 capsule (400 mg total) by mouth at bedtime. 90 capsule 3   . glucose blood (ONETOUCH VERIO) test strip For E11.22.  Use to check glucose qid 100 strip 12  . hydrALAZINE (APRESOLINE) 50 MG tablet Take 1 tablet (50 mg total) by mouth every 8 (eight) hours. 270 tablet 3  . isosorbide mononitrate (IMDUR) 60 MG 24 hr tablet TAKE 1 TABLET (60 MG TOTAL) BY MOUTH DAILY FOR 30 DAYS. ** DO NOT CRUSH ** 90 tablet 0  . nitroGLYCERIN (NITROSTAT) 0.4 MG SL tablet Place 1 tablet (0.4 mg total) under the tongue every 5 (five) minutes as needed. 25 tablet 3  . omeprazole (PRILOSEC) 20 MG capsule Take 1 capsule (20 mg total) by mouth daily. 90 capsule 1  . topiramate (TOPAMAX) 25 MG tablet TAKE 1 TABLET BY MOUTH TWICE A DAY 180 tablet 1  . torsemide (DEMADEX) 20 MG tablet Take 2 tablets (40 mg total) by mouth daily. 60 tablet 6  . Verapamil HCl CR 300 MG CP24 TAKE 1 CAPSULE (300 MG TOTAL) BY MOUTH DAILY. PLEASE CALL TO SCHEDULE OVERDUE APPT WITH DR ROSS 90 capsule 1   No current facility-administered medications for this visit.     Past Medical History:  Diagnosis Date  . Anxiety    doesn't take any meds for this  . Arteriosclerosis of coronary artery 08/31/2011   Myoview 9/12: EF 44, no ischemia //  Myoview 8/18: EF 57, low risk, possible small area of inferior ischemia // Minor nonobstructive CAD by cardiac catheterization - [facility] 8/18: Proximal LAD 35, OM2 25  . Arthritis    knuckles  . Asthma   . Carotid artery disease ([place])    Carotid US 7/14: Bilateral ICA 40-59 // Carotid US ([facility]) 2/17: bilat plaque without sig stenosis  . Chronic diastolic CHF (congestive heart failure) ([place]) 07/26/2017   Echo 7/18:Severe conc LVH, EF 60-65, no RWMA, Gr 2 DD, MAC, mild MR // right and left heart cath 8/18: LVEDP 21  . Claustrophobia 07/26/2012  . COPD (chronic obstructive pulmonary disease) ([place])    "chronic bronchitis - about every winter"  . CSF leak    dizziness/headache - assoc symptoms // 2/2 encephalocele s/p skull base reconstruction  . Diverticulosis   . Dyspnea    . History of CVA (cerebrovascular accident) 03/2004   denies residual 07/26/2012  . Hx of gout   . Hyperlipidemia    takes Pravastatin daily  . Hypertensive heart disease with chronic diastolic congestive heart failure ([place]) 07/26/2017  . Internal hemorrhoids   . Kidney stones   . LVH (left ventricular hypertrophy)    Echo 8/18: Severe conc LVH, EF 60-65, no RWMA, Gr 2 DD, MAC, mild MR  . OSA (obstructive sleep apnea) 07/26/2012   prior CPAP - stopped due to being primary caregiver for dying parent (stopped in 2016)  . PSVT (paroxysmal supraventricular tachycardia) ([place])   . Pulmonary hypertension, unspecified ([place]) 08/14/2017   cath 07/2017 >>PASP 64m Hg, PA mean 39 mm Hg  . Type II diabetes mellitus ([facility])     ROS:   All systems reviewed and negative except as noted in the HPI.   Past Surgical History:  Procedure Laterality Date  . ESOPHAGOGASTRODUODENOSCOPY (EGD) WITH PROPOFOL N/A 07/13/2015   Procedure: ESOPHAGOGASTRODUODENOSCOPY (EGD) WITH PROPOFOL;  Surgeon: PCarol Ada MD;  Location: [facility];  Service: Endoscopy;  Laterality: N/A;  . NASAL SINUS SURGERY  01/29/2012   Procedure: ENDOSCOPIC SINUS SURGERY WITH STEALTH;  Surgeon: MRuby Cola MD;  [facility]  Service: ENT;  Laterality: N/A;  . NM MYOVIEW LTD  08/25/2011   Low risk study, no evidence of ischemia, EF 44%  . REFRACTIVE SURGERY  ~ 2010   left  . RIGHT/LEFT HEART CATH AND CORONARY ANGIOGRAPHY N/A 07/30/2017   Procedure: RIGHT/LEFT HEART CATH AND CORONARY ANGIOGRAPHY;  Surgeon: JMartinique Peter M, MD;  [facility];  Service: Cardiovascular;  Laterality: N/A;  . SPHENOIDECTOMY  01/29/2012   Procedure: SPHENOIDECTOMY;  Surgeon: MRuby Cola MD;  [facility]  Service: ENT;  Laterality: Left;  REPAIR OF LEFT SPHENOID    . SVT ABLATION N/A 05/21/2020   Procedure: SVT ABLATION;  Surgeon: TEvans Lance MD;  [facility];  Service: Cardiovascular;  Laterality: N/A;  . TOTAL  ABDOMINAL HYSTERECTOMY  1994     Family History  Problem Relation Age of Onset  . Colon cancer Sister 490 . Breast cancer Mother 679 . Alzheimer's disease Mother   . Diabetes Sister 499 . Heart failure Sister   . Diabetes Sister 430 . Lung cancer Father   . Breast cancer Sister   . Cancer Sister        mouth  . Anesthesia problems Neg Hx   . Hypotension Neg Hx   . Malignant hyperthermia Neg Hx   . Pseudochol deficiency Neg Hx  Social History   Socioeconomic History  . Marital status: Single    Spouse name: Not on file  . Number of children: 0  . Years of education: Not on file  . Highest education level: Not on file  Occupational History  . Occupation: disabled  Tobacco Use  . Smoking status: Former Smoker    Packs/day: 0.50    Years: 35.00    Pack years: 17.50    Types: Cigarettes    Quit date: 08/16/2019    Years since quitting: 0.8  . Smokeless tobacco: Never Used  Vaping Use  . Vaping Use: Never used  Substance and Sexual Activity  . Alcohol use: Yes    Alcohol/week: 1.0 standard drink    Types: 1 Glasses of wine per week    Comment: rarely  . Drug use: No  . Sexual activity: Not Currently    Birth control/protection: Surgical  Other Topics Concern  . Not on file  Social History Narrative   Currently unemployed.   No children, not married   Social Determinants of Radio broadcast assistant Strain:   . Difficulty of Paying Living Expenses:   Food Insecurity:   . Worried About Charity fundraiser in the Last Year:   . Arboriculturist in the Last Year:   Transportation Needs:   . Film/video editor (Medical):   Marland Kitchen Lack of Transportation (Non-Medical):   Physical Activity:   . Days of Exercise per Week:   . Minutes of Exercise per Session:   Stress:   . Feeling of Stress :   Social Connections:   . Frequency of Communication with Friends and Family:   . Frequency of Social Gatherings with Friends and Family:   . Attends Religious  Services:   . Active Member of Clubs or Organizations:   . Attends Archivist Meetings:   Marland Kitchen Marital Status:   Intimate Partner Violence:   . Fear of Current or Ex-Partner:   . Emotionally Abused:   Marland Kitchen Physically Abused:   . Sexually Abused:      BP 134/80   Pulse 77   Ht _0  (1.575 m)   Wt 245 lb (111.1 kg)   SpO2 98%   BMI 44.81 kg/m   Physical Exam:  Chronically ill appearing 64 yo woman, NAD HEENT: Unremarkable Neck:  7 cm JVD, no thyromegally Lymphatics:  No adenopathy Back:  No CVA tenderness Lungs:  Clear with no wheezes HEART:  IRegular rate rhythm, no murmurs, no rubs, no clicks Abd:  soft, obese, positive bowel sounds, no organomegally, no rebound, no guarding Ext:  2 plus pulses, no edema, no cyanosis, no clubbing Skin:  No rashes no nodules Neuro:  CN II through XII intact, motor grossly intact  EKG - nsr at 74/min   Assess/Plan: 1. SVT/Atrial fib - She is quite symptomatic and I have recommended we start her on low dose flecainide. Additional recs to follow her ECG in 2 weeks. She will start eliquis. 2. Obesity - she is encouraged to lose weight.  3. COPD - she is oxygen dependent. She will continue her current meds. 4. HTN - her bp is fairly well controlled. We will follow. Avoid salty food.  Mikle Bosworth.D.

## 2020-06-23 ENCOUNTER — Other Ambulatory Visit (HOSPITAL_COMMUNITY): Payer: Self-pay | Admitting: Internal Medicine

## 2020-06-23 DIAGNOSIS — I5032 Chronic diastolic (congestive) heart failure: Secondary | ICD-10-CM

## 2020-06-25 ENCOUNTER — Other Ambulatory Visit (HOSPITAL_COMMUNITY): Payer: Self-pay | Admitting: *Deleted

## 2020-06-25 DIAGNOSIS — I5022 Chronic systolic (congestive) heart failure: Secondary | ICD-10-CM

## 2020-06-25 MED ORDER — SODIUM CHLORIDE 0.9% FLUSH: 3.0000 mL | Freq: Two times a day (BID) | INTRAVENOUS | Status: DC

## 2020-06-26 ENCOUNTER — Other Ambulatory Visit: Payer: Self-pay

## 2020-06-26 ENCOUNTER — Ambulatory Visit (HOSPITAL_COMMUNITY)
Admission: RE | Admit: 2020-06-26 | Discharge: 2020-06-26 | Disposition: A | Discharge status: Home / Self Care | Payer: Medicare Other | Source: Ambulatory Visit | Attending: Cardiology | Admitting: Cardiology

## 2020-06-26 DIAGNOSIS — I509 Heart failure, unspecified: Secondary | ICD-10-CM | POA: Insufficient documentation

## 2020-06-26 LAB — PROTIME-INR
INR: 1.1 (ref 0.8–1.2)
Prothrombin Time: 13.5 seconds (ref 11.4–15.2)

## 2020-06-26 LAB — BASIC METABOLIC PANEL
Anion gap: 10 (ref 5–15)
BUN: 32 mg/dL — ABNORMAL HIGH (ref 8–23)
CO2: 34 mmol/L — ABNORMAL HIGH (ref 22–32)
Calcium: 8.3 mg/dL — ABNORMAL LOW (ref 8.9–10.3)
Chloride: 100 mmol/L (ref 98–111)
Creatinine, Ser: 1.98 mg/dL — ABNORMAL HIGH (ref 0.44–1.00)
GFR calc Af Amer: 30 mL/min — ABNORMAL LOW (ref >60)
GFR calc non Af Amer: 26 mL/min — ABNORMAL LOW (ref >60)
Glucose, Bld: 130 mg/dL — ABNORMAL HIGH (ref 70–99)
Potassium: 4.7 mmol/L (ref 3.5–5.1)
Sodium: 144 mmol/L (ref 135–145)

## 2020-06-26 LAB — CBC
HCT: 41.8 % (ref 36.0–46.0)
Hemoglobin: 12.3 g/dL (ref 12.0–15.0)
MCH: 27.3 pg (ref 26.0–34.0)
MCHC: 29.4 g/dL — ABNORMAL LOW (ref 30.0–36.0)
MCV: 92.7 fL (ref 80.0–100.0)
Platelets: 198 10*3/uL (ref 150–400)
RBC: 4.51 MIL/uL (ref 3.87–5.11)
RDW: 15.1 % (ref 11.5–15.5)
WBC: 6.3 10*3/uL (ref 4.0–10.5)
nRBC: 0 % (ref 0.0–0.2)

## 2020-06-29 ENCOUNTER — Ambulatory Visit (HOSPITAL_COMMUNITY)
Admission: RE | Admit: 2020-06-29 | Discharge: 2020-06-29 | Disposition: A | Discharge status: Home / Self Care | Payer: Medicare Other | Attending: Internal Medicine | Admitting: Internal Medicine

## 2020-06-29 ENCOUNTER — Encounter (HOSPITAL_COMMUNITY): Payer: Self-pay | Admitting: Internal Medicine

## 2020-06-29 ENCOUNTER — Encounter (HOSPITAL_COMMUNITY)
Admission: RE | Disposition: A | Discharge status: Home / Self Care | Payer: Self-pay | Source: Home / Self Care | Attending: Internal Medicine

## 2020-06-29 ENCOUNTER — Other Ambulatory Visit: Payer: Self-pay

## 2020-06-29 DIAGNOSIS — Z9981 Dependence on supplemental oxygen: Secondary | ICD-10-CM | POA: Diagnosis not present

## 2020-06-29 DIAGNOSIS — E1122 Type 2 diabetes mellitus with diabetic chronic kidney disease: Secondary | ICD-10-CM | POA: Insufficient documentation

## 2020-06-29 DIAGNOSIS — G4733 Obstructive sleep apnea (adult) (pediatric): Secondary | ICD-10-CM | POA: Diagnosis not present

## 2020-06-29 DIAGNOSIS — N183 Chronic kidney disease, stage 3 unspecified: Secondary | ICD-10-CM | POA: Insufficient documentation

## 2020-06-29 DIAGNOSIS — J449 Chronic obstructive pulmonary disease, unspecified: Secondary | ICD-10-CM | POA: Diagnosis not present

## 2020-06-29 DIAGNOSIS — I2721 Secondary pulmonary arterial hypertension: Secondary | ICD-10-CM | POA: Insufficient documentation

## 2020-06-29 DIAGNOSIS — E785 Hyperlipidemia, unspecified: Secondary | ICD-10-CM | POA: Diagnosis not present

## 2020-06-29 DIAGNOSIS — I471 Supraventricular tachycardia: Secondary | ICD-10-CM | POA: Diagnosis not present

## 2020-06-29 DIAGNOSIS — I428 Other cardiomyopathies: Secondary | ICD-10-CM | POA: Diagnosis not present

## 2020-06-29 DIAGNOSIS — Z87891 Personal history of nicotine dependence: Secondary | ICD-10-CM | POA: Insufficient documentation

## 2020-06-29 DIAGNOSIS — I5032 Chronic diastolic (congestive) heart failure: Secondary | ICD-10-CM | POA: Diagnosis not present

## 2020-06-29 DIAGNOSIS — I5022 Chronic systolic (congestive) heart failure: Secondary | ICD-10-CM

## 2020-06-29 DIAGNOSIS — I2510 Atherosclerotic heart disease of native coronary artery without angina pectoris: Secondary | ICD-10-CM | POA: Diagnosis not present

## 2020-06-29 DIAGNOSIS — I130 Hypertensive heart and chronic kidney disease with heart failure and stage 1 through stage 4 chronic kidney disease, or unspecified chronic kidney disease: Secondary | ICD-10-CM | POA: Diagnosis not present

## 2020-06-29 DIAGNOSIS — Z6841 Body Mass Index (BMI) 40.0 and over, adult: Secondary | ICD-10-CM | POA: Insufficient documentation

## 2020-06-29 DIAGNOSIS — Z79899 Other long term (current) drug therapy: Secondary | ICD-10-CM | POA: Insufficient documentation

## 2020-06-29 DIAGNOSIS — I5082 Biventricular heart failure: Secondary | ICD-10-CM | POA: Diagnosis not present

## 2020-06-29 DIAGNOSIS — Z8673 Personal history of transient ischemic attack (TIA), and cerebral infarction without residual deficits: Secondary | ICD-10-CM | POA: Diagnosis not present

## 2020-06-29 DIAGNOSIS — J9611 Chronic respiratory failure with hypoxia: Secondary | ICD-10-CM | POA: Insufficient documentation

## 2020-06-29 DIAGNOSIS — Z7984 Long term (current) use of oral hypoglycemic drugs: Secondary | ICD-10-CM | POA: Insufficient documentation

## 2020-06-29 HISTORY — PX: RIGHT HEART CATH: CATH118263

## 2020-06-29 LAB — POCT I-STAT EG7
Acid-Base Excess: 10 mmol/L — ABNORMAL HIGH (ref 0.0–2.0)
Acid-Base Excess: 9 mmol/L — ABNORMAL HIGH (ref 0.0–2.0)
Bicarbonate: 36.8 mmol/L — ABNORMAL HIGH (ref 20.0–28.0)
Bicarbonate: 37.9 mmol/L — ABNORMAL HIGH (ref 20.0–28.0)
Calcium, Ion: 1.05 mmol/L — ABNORMAL LOW (ref 1.15–1.40)
Calcium, Ion: 1.1 mmol/L — ABNORMAL LOW (ref 1.15–1.40)
HCT: 37 % (ref 36.0–46.0)
HCT: 38 % (ref 36.0–46.0)
Hemoglobin: 12.6 g/dL (ref 12.0–15.0)
Hemoglobin: 12.9 g/dL (ref 12.0–15.0)
O2 Saturation: 56 %
O2 Saturation: 57 %
Potassium: 4.3 mmol/L (ref 3.5–5.1)
Potassium: 4.4 mmol/L (ref 3.5–5.1)
Sodium: 144 mmol/L (ref 135–145)
Sodium: 146 mmol/L — ABNORMAL HIGH (ref 135–145)
TCO2: 39 mmol/L — ABNORMAL HIGH (ref 22–32)
TCO2: 40 mmol/L — ABNORMAL HIGH (ref 22–32)
pCO2, Ven: 66.7 mmHg — ABNORMAL HIGH (ref 44.0–60.0)
pCO2, Ven: 68.8 mmHg — ABNORMAL HIGH (ref 44.0–60.0)
pH, Ven: 7.349 (ref 7.250–7.430)
pH, Ven: 7.349 (ref 7.250–7.430)
pO2, Ven: 32 mmHg (ref 32.0–45.0)
pO2, Ven: 32 mmHg (ref 32.0–45.0)

## 2020-06-29 LAB — GLUCOSE, CAPILLARY: Glucose-Capillary: 132 mg/dL — ABNORMAL HIGH (ref 70–99)

## 2020-06-29 SURGERY — RIGHT HEART CATH
Anesthesia: LOCAL

## 2020-06-29 MED ORDER — LIDOCAINE HCL (PF) 1 % IJ SOLN
INTRAMUSCULAR | Status: DC | PRN
  Administered 2020-06-29: 2 mL

## 2020-06-29 MED ORDER — ASPIRIN 81 MG PO CHEW: 81.0000 mg | CHEWABLE_TABLET | ORAL | Status: DC

## 2020-06-29 MED ORDER — SODIUM CHLORIDE 0.9% FLUSH: 3.0000 mL | INTRAVENOUS | Status: DC | PRN

## 2020-06-29 MED ORDER — FUROSEMIDE 10 MG/ML IJ SOLN
INTRAMUSCULAR | Status: DC | PRN
  Administered 2020-06-29: 80 mg via INTRAVENOUS

## 2020-06-29 MED ORDER — LIDOCAINE HCL (PF) 1 % IJ SOLN
INTRAMUSCULAR | Status: AC
  Filled 2020-06-29: qty 30

## 2020-06-29 MED ORDER — SODIUM CHLORIDE 0.9 % IV SOLN: 250.0000 mL | INTRAVENOUS | Status: DC | PRN

## 2020-06-29 MED ORDER — ACETAMINOPHEN 325 MG PO TABS: 650.0000 mg | ORAL_TABLET | ORAL | Status: DC | PRN

## 2020-06-29 MED ORDER — HYDRALAZINE HCL 20 MG/ML IJ SOLN: 10.0000 mg | INTRAMUSCULAR | Status: DC | PRN

## 2020-06-29 MED ORDER — HEPARIN (PORCINE) IN NACL 1000-0.9 UT/500ML-% IV SOLN
INTRAVENOUS | Status: AC
  Filled 2020-06-29: qty 500

## 2020-06-29 MED ORDER — LABETALOL HCL 5 MG/ML IV SOLN: 10.0000 mg | INTRAVENOUS | Status: DC | PRN

## 2020-06-29 MED ORDER — SODIUM CHLORIDE 0.9 % IV SOLN: INTRAVENOUS | Status: DC

## 2020-06-29 MED ORDER — HEPARIN (PORCINE) IN NACL 1000-0.9 UT/500ML-% IV SOLN
INTRAVENOUS | Status: DC | PRN
  Administered 2020-06-29: 500 mL

## 2020-06-29 MED ORDER — FUROSEMIDE 10 MG/ML IJ SOLN
INTRAMUSCULAR | Status: AC
  Filled 2020-06-29: qty 8

## 2020-06-29 MED ORDER — ONDANSETRON HCL 4 MG/2ML IJ SOLN: 4.0000 mg | Freq: Four times a day (QID) | INTRAMUSCULAR | Status: DC | PRN

## 2020-06-29 MED ORDER — ASPIRIN 81 MG PO CHEW
CHEWABLE_TABLET | ORAL | Status: AC
  Administered 2020-06-29: 81 mg
  Filled 2020-06-29: qty 1

## 2020-06-29 MED ORDER — SODIUM CHLORIDE 0.9% FLUSH: 3.0000 mL | Freq: Two times a day (BID) | INTRAVENOUS | Status: DC

## 2020-06-29 SURGICAL SUPPLY — 10 items
CATH SWAN GANZ 7F STRAIGHT (CATHETERS) ×2 IMPLANT
GLIDESHEATH SLENDER 7FR .021G (SHEATH) ×2 IMPLANT
KIT MICROPUNCTURE NIT STIFF (SHEATH) ×2 IMPLANT
PACK CARDIAC CATHETERIZATION (CUSTOM PROCEDURE TRAY) ×2 IMPLANT
PROTECTION STATION PRESSURIZED (MISCELLANEOUS) ×2
SHEATH PROBE COVER 6X72 (BAG) ×2 IMPLANT
STATION PROTECTION PRESSURIZED (MISCELLANEOUS) ×1 IMPLANT
TRANSDUCER W/STOPCOCK (MISCELLANEOUS) ×2 IMPLANT
TUBING ART PRESS 72  MALE/FEM (TUBING) ×2
TUBING ART PRESS 72 MALE/FEM (TUBING) ×1 IMPLANT

## 2020-06-29 NOTE — Discharge Instructions (Signed)
Venogram A venogram, or venography, is a procedure that uses an X-ray and dye (contrast) to examine how well the veins work and how blood flows through them. Contrast helps the veins show up on X-rays. A venogram may be done:  To evaluate abnormalities in the vein.  To identify clots within veins, such as deep vein thrombosis (DVT).  To map out the veins that might be needed for another procedure. Tell a health care provider about:  Any allergies you have, especially to medicines, shellfish, iodine, and contrast.  All medicines you are taking, including vitamins, herbs, eye drops, creams, and over-the-counter medicines.  Any problems you or family members have had with anesthetic medicines.  Any blood disorders you have.  Any surgeries you have had and any complications that occurred.  Any medical conditions you have.  Whether you are pregnant, may be pregnant, or are breastfeeding.  Any history of smoking or tobacco use. What are the risks? Generally, this is a safe procedure. However, problems may occur, including:  Infection.  Bleeding.  Blood clots.  Allergic reaction to medicines or contrast.  Damage to other structures or organs.  Kidney problems.  Increased risk of cancer. Being exposed to too much radiation over a lifetime can increase the risk of cancer. The risk is small. What happens before the procedure? Medicines Ask your health care provider about:  Changing or stopping your regular medicines. This is especially important if you are taking diabetes medicines or blood thinners.  Taking medicines such as aspirin and ibuprofen. These medicines can thin your blood. Do not take these medicines unless your health care provider tells you to take them.  Taking over-the-counter medicines, vitamins, herbs, and supplements. General instructions  Follow instructions from your health care provider about eating or drinking restrictions.  You may have blood tests  to check how well your kidneys and liver are working and how well your blood can clot.  Plan to have someone take you home from the hospital or clinic. What happens during the procedure?   An IV will be inserted into one of your veins.  You may be given a medicine to help you relax (sedative).  You will lie down on an X-ray table. The table may be tilted in different directions during the procedure to help the contrast move throughout your body. Safety straps will keep you secure if the table is tilted.  If veins in your arm or leg will be examined, a band may be wrapped around that arm or leg to keep the veins full of blood. This may cause your arm or leg to feel numb.  The contrast will be injected into your IV. You may have a hot, flushed feeling as it moves throughout your body. You may also have a metallic taste in your mouth. Both of these sensations will go away after the test is complete.  You may be asked to lie in different positions or place your legs or arms in different positions.  At the end of the procedure, you may be given IV fluids to help wash or flush the contrast out of your veins.  The IV will be removed, and pressure will be applied to the IV site to prevent bleeding. A bandage (dressing) may be applied to the IV site. The exact procedure may vary among health care providers and hospitals. What can I expect after the procedure?  Your blood pressure, heart rate, breathing rate, and blood oxygen level will be monitored until you   leave the hospital or clinic.  You may be given something to eat and drink.  You may have bruising or mild discomfort in the area where the IV was inserted. Follow these instructions at home: Eating and drinking   Follow instructions from your health care provider about eating or drinking restrictions.  Drink a lot of water for the first several days after the procedure, as directed by your health care provider. This helps to flush the  contrast out of your body. Activity  Rest as told by your health care provider.  Return to your normal activities as told by your health care provider. Ask your health care provider what activities are safe for you.  If you were given a sedative during your procedure, do not drive for 24 hours or until your health care provider approves. General instructions  Check your IV insertion area every day for signs of infection. Check for: ? Redness, swelling, or pain. ? Fluid or blood. ? Warmth. ? Pus or a bad smell.  Take over-the-counter and prescription medicines only as told by your health care provider.  Keep all follow-up visits as told by your health care provider. This is important. Contact a health care provider if:  Your skin becomes itchy or you develop a rash or hives.  You have a fever that does not get better with medicine.  You feel nauseous or you vomit.  You have redness, swelling, or pain around the insertion site.  You have fluid or blood coming from the insertion site.  Your insertion area feels warm to the touch.  You have pus or a bad smell coming from the insertion site. Get help right away if you:  Have shortness of breath or difficulty breathing.  Develop chest pain.  Faint.  Feel very dizzy. These symptoms may represent a serious problem that is an emergency. Do not wait to see if the symptoms will go away. Get medical help right away. Call your local emergency services (911 in the [place] Do not drive yourself to the hospital. Summary  A venogram, or venography, is a procedure that uses an X-ray and contrast dye to check how well the veins work and how blood flows through them.  An IV will be inserted into one of your veins in order to inject the contrast.  During the exam, you will lie on an X-ray table. The table may be tilted in different directions during the procedure to help the contrast move throughout your body. Safety straps will keep you  secure.  After the procedure, you will need to drink a lot of water to help wash or flush the contrast out of your body. This information is not intended to replace advice given to you by your health care provider. Make sure you discuss any questions you have with your health care provider. Document Revised: 07/02/2019 Document Reviewed: 07/02/2019 Elsevier Patient Education  2020 Elsevier Inc.  

## 2020-06-29 NOTE — H&P (Signed)
ADVANCED HF CLINIC NOTE  PCP: Primary Cardiologist: Dr Harrington Challenger HF MD; Dr Haroldine Laws  Reason for Visit: f/u for Chronic Systolic HF. [facility] f/u for a/c CHF.   HPI: Angel Stammen Sprinkleis a 64 y.o.femalewith a hx of morbid obesity, chronic systolic/diastolic CHF, PSVT, prior CVA, HTN, DM, OSA, CKD with baseline creatinine 1.3-1.4, COPD with ongoing tobacco, pulmonary hypertensionand CSF rhinorrhea s/p skull base reconstruction.   Admitted with A/C diastolic heart failure. > 30 pound weight gain in a few weeks after her  PCP discontinued lasix  06/27/2019 secondary to worsening kidney function. In ER markedly volume overloaded. [name] was placed to guide therapy with initial swan numbers showing cardiac index 1.6 and elevated filling pressures. ECHO completed and showed biventricular HF with LVEF 25-30 and RV severely reduced. Placed on milrinone and diuresed with IV lasix. As she improved milrinone was weaned off. HF medications adjusted. Discharge weight was 236 pounds.   Echo 1/21 showed  EF 45-50% with mod RV dysfunction. Grade 2 DD. MV is thickened with mild MR/MS  Zio patch placed 1/21 for palpitations and showed frequent runs of SVT. Was referred to Dr. Lovena Le w/ EP. Plan is for SVT ablation in June.   Recently admitted to [facility] 4/21 for a/c CHF requiring IV Lasix. Hospital course c/b AKI. SCr spiked to 3.3. [name] was discontinued.  SCr improved, down to 1.78 day of discharge. She is now only on torsemide 20 mg PRN. She remains on Jardiance. She was also unable to wean off of supplemental O2. She is now on home O2 at 2L/ min.   She went back to the ED, just 2 days ago on 5/25 for worsening dyspnea and diagnosed w/ Left sided PNA and placed on abx. Did not require admission. BNP was also checked in ED that day was was up to 2,032 (up from 1,641). SCr down to 1.50. Diuretic not adjusted.    Was seen in clinic in May and SOB was somewhat improved but still not back to baseline   Continues to require supp O2. [place] set up.   She presents today for RHC. Reports stable class III HF symptoms      Cardiac Testing Echo 08/18/19 EF 25-30% Mod LVH. RV dilated and severely HK. Personally reviewed Echo 1/21 EF 45-50% with mod RV dysfunction. Grade 2 DD. MV is thickened with mild MR/MS  RHC/LHC 07/31/2019  Cardiac catheterization demonstrated minor nonobstructive CAD, mildly elevated LVEDP and moderate pulmonary hypertension. LAD 35% OM2 25% RA 10 PA 57/22 (39) PCWP 19 LVEDP 21 Fick 3.7/1.7   ROS: All systems negative except as listed in HPI, PMH and Problem List.  SH:  Social History   Socioeconomic History  . Marital status: Single    Spouse name: Not on file  . Number of children: 0  . Years of education: Not on file  . Highest education level: Not on file  Occupational History  . Occupation: disabled  Tobacco Use  . Smoking status: Former Smoker    Packs/day: 0.50    Years: 35.00    Pack years: 17.50    Types: Cigarettes    Quit date: 08/16/2019    Years since quitting: 0.8  . Smokeless tobacco: Never Used  Vaping Use  . Vaping Use: Never used  Substance and Sexual Activity  . Alcohol use: Yes    Alcohol/week: 1.0 standard drink    Types: 1 Glasses of wine per week    Comment: rarely  . Drug use: No  .  Sexual activity: Not Currently    Birth control/protection: Surgical  Other Topics Concern  . Not on file  Social History Narrative   Currently unemployed.   No children, not married   Social Determinants of Radio broadcast assistant Strain:   . Difficulty of Paying Living Expenses:   Food Insecurity:   . Worried About Charity fundraiser in the Last Year:   . Arboriculturist in the Last Year:   Transportation Needs:   . Film/video editor (Medical):   Marland Kitchen Lack of Transportation (Non-Medical):   Physical Activity:   . Days of Exercise per Week:   . Minutes of Exercise per Session:   Stress:   . Feeling of Stress :   Social  Connections:   . Frequency of Communication with Friends and Family:   . Frequency of Social Gatherings with Friends and Family:   . Attends Religious Services:   . Active Member of Clubs or Organizations:   . Attends Archivist Meetings:   Marland Kitchen Marital Status:   Intimate Partner Violence:   . Fear of Current or Ex-Partner:   . Emotionally Abused:   Marland Kitchen Physically Abused:   . Sexually Abused:     FH:  Family History  Problem Relation Age of Onset  . Colon cancer Sister 80  . Breast cancer Mother 33  . Alzheimer's disease Mother   . Diabetes Sister 41  . Heart failure Sister   . Diabetes Sister 81  . Lung cancer Father   . Breast cancer Sister   . Cancer Sister        mouth  . Anesthesia problems Neg Hx   . Hypotension Neg Hx   . Malignant hyperthermia Neg Hx   . Pseudochol deficiency Neg Hx     Past Medical History:  Diagnosis Date  . Anxiety    doesn't take any meds for this  . Arteriosclerosis of coronary artery 08/31/2011   Myoview 9/12: EF 44, no ischemia // Myoview 8/18: EF 57, low risk, possible small area of inferior ischemia // Minor nonobstructive CAD by cardiac catheterization - LHC 8/18: Proximal LAD 35, OM2 25  . Arthritis    knuckles  . Asthma   . Carotid artery disease ([place])    Carotid US 7/14: Bilateral ICA 40-59 // Carotid US ([facility]) 2/17: bilat plaque without sig stenosis  . Chronic diastolic CHF (congestive heart failure) ([place]) 07/26/2017   Echo 7/18:Severe conc LVH, EF 60-65, no RWMA, Gr 2 DD, MAC, mild MR // right and left heart cath 8/18: LVEDP 21  . Claustrophobia 07/26/2012  . COPD (chronic obstructive pulmonary disease) ([place])    "chronic bronchitis - about every winter"  . CSF leak    dizziness/headache - assoc symptoms // 2/2 encephalocele s/p skull base reconstruction  . Diverticulosis   . Dyspnea   . History of CVA (cerebrovascular accident) 03/2004   denies residual 07/26/2012  . Hx of gout   . Hyperlipidemia    takes Pravastatin  daily  . Hypertensive heart disease with chronic diastolic congestive heart failure ([place]) 07/26/2017  . Internal hemorrhoids   . Kidney stones   . LVH (left ventricular hypertrophy)    Echo 8/18: Severe conc LVH, EF 60-65, no RWMA, Gr 2 DD, MAC, mild MR  . OSA (obstructive sleep apnea) 07/26/2012   prior CPAP - stopped due to being primary caregiver for dying parent (stopped in 2016)  . PSVT (paroxysmal supraventricular tachycardia) ([place])   .  Pulmonary hypertension, unspecified ([place]) 08/14/2017   cath 07/2017 >>PASP 69m Hg, PA mean 39 mm Hg  . Type II diabetes mellitus ([place]     Current Facility-Administered Medications  Medication Dose Route Frequency Provider Last Rate Last Admin  . 0.9 %  sodium chloride infusion  250 mL Intravenous PRN Jayonna Meyering, DShaune Pascal MD      . 0.9 %  sodium chloride infusion   Intravenous Continuous Eloise Picone, DShaune Pascal MD 10 mL/hr at 06/29/20 0905 New Bag at 06/29/20 0905  . aspirin chewable tablet 81 mg  81 mg Oral Pre-Cath Chessie Neuharth, DShaune Pascal MD      . sodium chloride flush (NS) 0.9 % injection 3 mL  3 mL Intravenous PRN Damani Kelemen, DShaune Pascal MD        Vitals:   06/29/20 0849  BP: (!) 155/81  Pulse: 70  Resp: 16  Temp: 97.9 F (36.6 C)  TempSrc: Oral  SpO2: 95%  Weight: (!) 109.8 kg  Height: _0  (1.575 m)   Wt Readings from Last 3 Encounters:  06/29/20 (!) 109.8 kg  06/20/20 111.1 kg  05/21/20 110.2 kg   PHYSICAL EXAM: General:  ObeseNo respiratory difficulty but using [place] HEENT: normal Neck: supple. Hard to see JVP. Carotids 2+ bilat; no bruits. No lymphadenopathy or thryomegaly appreciated. Cor: PMI nondisplaced. Regular rate & rhythm. No rubs, gallops or murmurs. Lungs: clear Abdomen: obese soft, nontender, nondistended. No hepatosplenomegaly. No bruits or masses. Good bowel sounds. Extremities: no cyanosis, clubbing, rash, trace edema Neuro: alert & orientedx3, cranial nerves grossly intact. moves all 4 extremities w/o difficulty.  Affect pleasant   ASSESSMENT & PLAN:  1. Chronic biventricular HF due to NICM - Echo 9/20 EF 25-30% RV severely HK  - Cath 8/20 with minimal CAD - Echo 1/21  EF 45-50% with mod RV dysfunction. Grade 2 DD. MV is thickened with mild MR/MS - Remains NYHA III-IIIb confounded by obesity and COPD - new supp O2 requirements since recent admit for a/c CHF + AKI. Remains on 2L [place] - With new O2 requirements and recent admission for CHF and ongoing problems w/ fluid overload, she presents for planned RHC to reassess pulmonary artery and filling pressures. She had moderate PH by cath in 2018 and has not been compliant w/ CPAP. Also has COPD. Concerned her PAvondalehas worsened. Will need to assess CO as well w/ recent worsening of renal function.  -  Continue torsemide  - No longer on Entresto due to recent AKI  - Continue hydralazine 50 mg three times a day + 60 mg imdur.  - Has not been on b-blocker with wheezing - Continue Jardiance - Advised low sodium diet   2. Chronic hypoxic respiratory failure - Follows with Dr. IValeta Harmsin Pulmonary - now on supp O2 - concern that her PCockehas worsened  - needs to improve compliance w/ CPAP  - Plan RHC today  3. PAH, mild to moderate - likely who GROUP II & III  - not candidate for selective pulmonary vasodilators - continue management of systolic HF per above and CPAP therapy for OSA. Needs to use CPAP - Plan RHC today - Once better diuresed, would benefit from repeat PFTs and V/Q scan   4. CKD Stage III - baseline creatinine 1.4-1.7 - Entresto has been discontinued   5. COPD - Quit smoking since 08/16/19.  - followed by pulmonology   6. Morbid obesity Body mass index is 44.26 kg/m. - encouraged increasing physical activity for wt loss  7. OSA - followed by Dr. Radford Pax - admits to poor compliance w/ CPAP - again encouraged that she starts to use this nightly   8. DM2 - on SGLT2i  9. Palpitations=> SVT - Zio patch 1/21showed frequent SVT -  Referred to EP. Dr. Lovena Le now following    Glori Bickers MD 9:06 AM

## 2020-07-04 ENCOUNTER — Encounter (HOSPITAL_COMMUNITY): Payer: Medicare Other | Admitting: Internal Medicine

## 2020-07-12 ENCOUNTER — Ambulatory Visit: Payer: Medicare Other | Admitting: Nurse Practitioner

## 2020-07-13 ENCOUNTER — Telehealth: Payer: Self-pay | Admitting: *Deleted

## 2020-07-13 ENCOUNTER — Ambulatory Visit: Payer: Medicare Other | Admitting: Nurse Practitioner

## 2020-07-13 NOTE — Telephone Encounter (Signed)
I have left a message for patient to call back. She is overdue for hospital colonoscopy (needs hospital procedure due to CHF, CAD, severe COPD, pulmonary HTN, DM, sleep apnea, HTN, kidney disease, EF 30-35%). She had 9 polyps in 2017. After speaking in more detail with Dr Hilarie Fredrickson, he feels it would be best to re-evaluate her in the office to see if she is still an appropriate candidate to complete a colonoscopy on.

## 2020-07-13 NOTE — Telephone Encounter (Signed)
Patient has been advised that we would like to discuss whether she would still be an appropriate candidate for colonoscopy recall for polyp surveillence. She is scheduled for 08/21/20 at 230pm. She verbalizes understanding of this information and is in agreement with the plan.

## 2020-07-13 NOTE — Telephone Encounter (Signed)
Pt called returning your call 

## 2020-07-13 NOTE — Telephone Encounter (Signed)
Left message for patient to call back  

## 2020-07-16 ENCOUNTER — Other Ambulatory Visit: Payer: Self-pay

## 2020-07-16 ENCOUNTER — Ambulatory Visit (INDEPENDENT_AMBULATORY_CARE_PROVIDER_SITE_OTHER): Payer: Medicare Other | Admitting: Podiatry

## 2020-07-16 DIAGNOSIS — E1140 Type 2 diabetes mellitus with diabetic neuropathy, unspecified: Secondary | ICD-10-CM | POA: Diagnosis not present

## 2020-07-16 DIAGNOSIS — B351 Tinea unguium: Secondary | ICD-10-CM

## 2020-07-16 DIAGNOSIS — M79676 Pain in unspecified toe(s): Secondary | ICD-10-CM | POA: Diagnosis not present

## 2020-07-17 NOTE — Progress Notes (Signed)
Subjective: 64 year old female presents the office for concerns of thick, discolored toenails that she cannot trim her self.  She was last seen here for ankle sprain on the right side which resolved.  Only concern today is the toenails.  Denies any drainage or pus or any swelling. Denies any systemic complaints such as fevers, chills, nausea, vomiting.  Objective: AAO x3, NAD DP/PT pulses palpable bilaterally, CRT less than 3 seconds Nails appear to be hypertrophic, dystrophic with yellow-brown discoloration the nails 2 through 5 on the left and 1 through 5 on the right.  Tenderness in these nails as well.  No edema, erythema or signs of infection No open lesions or pre-ulcerative lesions.  No pain with calf compression, swelling, warmth, erythema  Assessment: Symptomatic onychomycosis  Plan: -All treatment options discussed with the patient including all alternatives, risks, complications.  -Nails debrided x9 without any complications or bleeding -Discussed daily foot inspection -Patient encouraged to call the office with any questions, concerns, change in symptoms.   Return in about 3 months (around 10/16/2020).  Trula Slade DPM

## 2020-07-23 ENCOUNTER — Other Ambulatory Visit: Payer: Self-pay

## 2020-07-24 ENCOUNTER — Encounter: Payer: Self-pay | Admitting: Nurse Practitioner

## 2020-07-24 ENCOUNTER — Ambulatory Visit (INDEPENDENT_AMBULATORY_CARE_PROVIDER_SITE_OTHER): Payer: Medicare Other | Admitting: Nurse Practitioner

## 2020-07-24 VITALS — BP 138/88 | HR 78 | Temp 96.9°F | Wt 251.0 lb

## 2020-07-24 DIAGNOSIS — E1142 Type 2 diabetes mellitus with diabetic polyneuropathy: Secondary | ICD-10-CM | POA: Diagnosis not present

## 2020-07-24 DIAGNOSIS — Z794 Long term (current) use of insulin: Secondary | ICD-10-CM | POA: Diagnosis not present

## 2020-07-24 DIAGNOSIS — N184 Chronic kidney disease, stage 4 (severe): Secondary | ICD-10-CM | POA: Diagnosis not present

## 2020-07-24 NOTE — Progress Notes (Signed)
Subjective:  Patient ID: [name], female    DOB: [birth year]  Age: 64 y.o. MRN: [id number]  CC: Follow-up (DM, HTN, Hyperlipidemia, Pt states she has been having some swelling in her left foot. Pt states her sister that she lived with and helped with her medical things passed away so she is trying to do things alone.)  HPI  Type 2 diabetes mellitus with stage 3 chronic kidney disease, with long-term current use of insulin ([name]) Home glucose at 100-120 per patient Current use of jardiance only Will schedule annual eye exam Maintain current medication F/up 50month  Wt Readings from Last 3 Encounters:  07/24/20 251 lb (113.9 kg)  06/29/20 (!) 242 lb (109.8 kg)  06/20/20 245 lb (111.1 kg)   BP Readings from Last 3 Encounters:  07/24/20 138/88  06/29/20 (!) 149/87  06/20/20 134/80   Reviewed past Medical, Social and Family history today.  Outpatient Medications Prior to Visit  Medication Sig Dispense Refill  . acetaminophen (TYLENOL) 325 MG tablet Take 2 tablets (650 mg total) by mouth every 6 (six) hours as needed for mild pain. 90 tablet 2  . albuterol (VENTOLIN HFA) 108 (90 Base) MCG/ACT inhaler TAKE 2 PUFFS BY MOUTH EVERY 6 HOURS AS NEEDED FOR WHEEZE OR SHORTNESS OF BREATH 18 g 1  . allopurinol (ZYLOPRIM) 100 MG tablet Take 100 mg by mouth daily as needed (gout).     .Marland Kitchenapixaban (ELIQUIS) 5 MG TABS tablet Take 1 tablet (5 mg total) by mouth 2 (two) times daily. 60 tablet 11  . Blood Glucose Monitoring Suppl (ONETOUCH VERIO) w/Device KIT USE TO CHECK BLOOD GLUCOSE 4 TIMES A DAY . DIAGNOSIS: TYPE 2 DIABETES E11.59 1 kit 0  . fenofibrate 54 MG tablet Take 1 tablet (54 mg total) by mouth daily. 90 tablet 3  . flecainide (TAMBOCOR) 50 MG tablet Take 1 tablet (50 mg total) by mouth 2 (two) times daily. 180 tablet 3  . FLUoxetine (PROZAC) 20 MG capsule Take 1 capsule (20 mg total) by mouth at bedtime. 90 capsule 3  . gabapentin (NEURONTIN) 400 MG capsule Take 1 capsule (400  mg total) by mouth at bedtime. 90 capsule 3  . glucose blood (ONETOUCH VERIO) test strip For E11.22.  Use to check glucose qid 100 strip 12  . hydrALAZINE (APRESOLINE) 50 MG tablet Take 1 tablet (50 mg total) by mouth every 8 (eight) hours. 270 tablet 3  . isosorbide mononitrate (IMDUR) 60 MG 24 hr tablet TAKE 1 TABLET (60 MG TOTAL) BY MOUTH DAILY FOR 30 DAYS. ** DO NOT CRUSH ** 90 tablet 0  . nitroGLYCERIN (NITROSTAT) 0.4 MG SL tablet Place 1 tablet (0.4 mg total) under the tongue every 5 (five) minutes as needed. 25 tablet 3  . omeprazole (PRILOSEC) 20 MG capsule Take 1 capsule (20 mg total) by mouth daily. 90 capsule 1  . rosuvastatin (CRESTOR) 20 MG tablet Take 20 mg by mouth at bedtime.    . topiramate (TOPAMAX) 25 MG tablet TAKE 1 TABLET BY MOUTH TWICE A DAY 180 tablet 1  . torsemide (DEMADEX) 20 MG tablet Take 2 tablets (40 mg total) by mouth daily. 60 tablet 6  . Verapamil HCl CR 300 MG CP24 TAKE 1 CAPSULE (300 MG TOTAL) BY MOUTH DAILY. PLEASE CALL TO SCHEDULE OVERDUE APPT WITH DR ROSS 90 capsule 1   No facility-administered medications prior to visit.    ROS See HPI  Objective:  BP 138/88 (BP Location: Left Arm, Patient Position: Sitting, Cuff  Size: Normal)   Pulse 78   Temp (!) 96.9 F (36.1 C) (Temporal)   Wt 251 lb (113.9 kg)   SpO2 97% Comment: with 3L of O2  BMI 45.91 kg/m   Physical Exam Constitutional:      Appearance: She is obese.  Cardiovascular:     Rate and Rhythm: Normal rate.  Pulmonary:     Effort: Pulmonary effort is normal.     Breath sounds: Rales present.  Musculoskeletal:     Right lower leg: Edema present.     Left lower leg: Edema present.  Skin:    Findings: No erythema.  Neurological:     Mental Status: She is alert and oriented to person, place, and time.    Assessment & Plan:  This visit occurred during the SARS-CoV-2 public health emergency.  Safety protocols were in place, including screening questions prior to the visit, additional  usage of staff PPE, and extensive cleaning of exam room while observing appropriate contact time as indicated for disinfecting solutions.   [name] was seen today for follow-up.  Diagnoses and all orders for this visit:  Type 2 diabetes mellitus with diabetic polyneuropathy, with long-term current use of insulin (HCC)  CKD (chronic kidney disease) stage 4, GFR 15-29 ml/min (HCC)    Problem List Items Addressed This Visit      Endocrine   Type 2 diabetes mellitus with diabetic polyneuropathy, with long-term current use of insulin (HCC) - Primary     Genitourinary   CKD (chronic kidney disease) stage 4, GFR 15-29 ml/min (HCC)      Follow-up: Return in about 6 months (around 01/24/2021) for DM and , hyperlipidemia (fasting).  Wilfred Lacy, NP

## 2020-07-24 NOTE — Assessment & Plan Note (Signed)
Home glucose at 100-120 per patient Current use of jardiance only Will schedule annual eye exam Maintain current medication F/up 24months

## 2020-07-24 NOTE — Patient Instructions (Addendum)
Please maintain appt with cardiology and nephrology. No change in medication. Schedule annual eye exam.

## 2020-07-26 ENCOUNTER — Encounter: Payer: Self-pay | Admitting: Nurse Practitioner

## 2020-08-08 ENCOUNTER — Ambulatory Visit (INDEPENDENT_AMBULATORY_CARE_PROVIDER_SITE_OTHER): Payer: Medicare Other | Admitting: Internal Medicine

## 2020-08-08 ENCOUNTER — Encounter: Payer: Self-pay | Admitting: Internal Medicine

## 2020-08-08 ENCOUNTER — Other Ambulatory Visit: Payer: Self-pay | Admitting: Internal Medicine

## 2020-08-08 ENCOUNTER — Other Ambulatory Visit: Payer: Self-pay

## 2020-08-08 VITALS — BP 140/80 | HR 67 | Ht 62.0 in | Wt 261.0 lb

## 2020-08-08 DIAGNOSIS — I10 Essential (primary) hypertension: Secondary | ICD-10-CM

## 2020-08-08 DIAGNOSIS — I471 Supraventricular tachycardia: Secondary | ICD-10-CM | POA: Diagnosis not present

## 2020-08-08 DIAGNOSIS — I480 Paroxysmal atrial fibrillation: Secondary | ICD-10-CM

## 2020-08-08 MED ORDER — POTASSIUM CHLORIDE ER 10 MEQ PO TBCR: EXTENDED_RELEASE_TABLET | ORAL | 3 refills | Status: AC

## 2020-08-08 MED ORDER — METOLAZONE 2.5 MG PO TABS
ORAL_TABLET | ORAL | 3 refills | Status: AC
Start: 2020-08-08 — End: ?

## 2020-08-08 NOTE — Patient Instructions (Addendum)
Medication Instructions:  Your physician has recommended you make the following change in your medication:   1.  START taking metolazone 2.5 mg- Take ONE tablet by mouth ONLY on Wednesday.  2.  START taking potassium 10 meq- Take TWO tablets by mouth ONLY on Wednesday  Labwork: None ordered.  Testing/Procedures: None ordered.  Follow-Up: Your physician wants you to follow-up in: 6 months with Dr. Lovena Le.   You will receive a reminder letter in the mail two months in advance. If you don't receive a letter, please call our office to schedule the follow-up appointment.  Any Other Special Instructions Will Be Listed Below (If Applicable).  If you need a refill on your cardiac medications before your next appointment, please call your pharmacy.

## 2020-08-08 NOTE — Progress Notes (Signed)
HPI [name] returns today for followup. She is a pleasant 64 yo woman with chronic systolic and diastolic heart failure, morbid obesity, symptomatic atrial arrhythmias, PAF who has maintained NSR on flecainide, low dose. She has c/o 10 lb weight gain over the past week or two and denies dietary or medical non-compliance. She is very sedentary and is on chronic oxygen due to COPD. SHe has pulmonary HTN. She was in the hospital last month.  Allergies  Allergen Reactions  . Atorvastatin Other (See Comments) and Nausea And Vomiting    "legs cramped; moderate to almost severe" "legs cramped; moderate to almost severe"  . Ibuprofen Other (See Comments)    Other reaction(s): Other (See Comments) Per pt, MD doesn't want pt to take Per pt, MD doesn't want pt to take  . Metformin And Related Nausea And Vomiting  . Ace Inhibitors Cough and Nausea And Vomiting  . Hydrocodone-Acetaminophen Nausea And Vomiting  . Albuterol Other (See Comments)    Caused SVT, but pt is currently taking it.      Current Outpatient Medications  Medication Sig Dispense Refill  . acetaminophen (TYLENOL) 325 MG tablet Take 2 tablets (650 mg total) by mouth every 6 (six) hours as needed for mild pain. 90 tablet 2  . albuterol (VENTOLIN HFA) 108 (90 Base) MCG/ACT inhaler TAKE 2 PUFFS BY MOUTH EVERY 6 HOURS AS NEEDED FOR WHEEZE OR SHORTNESS OF BREATH 18 g 1  . allopurinol (ZYLOPRIM) 100 MG tablet Take 100 mg by mouth daily as needed (gout).     Marland Kitchen apixaban (ELIQUIS) 5 MG TABS tablet Take 1 tablet (5 mg total) by mouth 2 (two) times daily. 60 tablet 11  . Blood Glucose Monitoring Suppl (ONETOUCH VERIO) w/Device KIT USE TO CHECK BLOOD GLUCOSE 4 TIMES A DAY . DIAGNOSIS: TYPE 2 DIABETES E11.59 1 kit 0  . fenofibrate 54 MG tablet Take 1 tablet (54 mg total) by mouth daily. 90 tablet 3  . flecainide (TAMBOCOR) 50 MG tablet Take 1 tablet (50 mg total) by mouth 2 (two) times daily. 180 tablet 3  . FLUoxetine (PROZAC) 20  MG capsule Take 1 capsule (20 mg total) by mouth at bedtime. 90 capsule 3  . gabapentin (NEURONTIN) 400 MG capsule Take 1 capsule (400 mg total) by mouth at bedtime. 90 capsule 3  . glucose blood (ONETOUCH VERIO) test strip For E11.22.  Use to check glucose qid 100 strip 12  . hydrALAZINE (APRESOLINE) 50 MG tablet Take 1 tablet (50 mg total) by mouth every 8 (eight) hours. 270 tablet 3  . isosorbide mononitrate (IMDUR) 60 MG 24 hr tablet TAKE 1 TABLET (60 MG TOTAL) BY MOUTH DAILY FOR 30 DAYS. ** DO NOT CRUSH ** 90 tablet 0  . nitroGLYCERIN (NITROSTAT) 0.4 MG SL tablet Place 1 tablet (0.4 mg total) under the tongue every 5 (five) minutes as needed. 25 tablet 3  . omeprazole (PRILOSEC) 20 MG capsule Take 1 capsule (20 mg total) by mouth daily. 90 capsule 1  . rosuvastatin (CRESTOR) 20 MG tablet Take 20 mg by mouth at bedtime.    . topiramate (TOPAMAX) 25 MG tablet TAKE 1 TABLET BY MOUTH TWICE A DAY 180 tablet 1  . torsemide (DEMADEX) 20 MG tablet Take 2 tablets (40 mg total) by mouth daily. 60 tablet 6  . Verapamil HCl CR 300 MG CP24 TAKE 1 CAPSULE (300 MG TOTAL) BY MOUTH DAILY. PLEASE CALL TO SCHEDULE OVERDUE APPT WITH DR ROSS 90 capsule 1  No current facility-administered medications for this visit.     Past Medical History:  Diagnosis Date  . Anxiety    doesn't take any meds for this  . Arteriosclerosis of coronary artery 08/31/2011   Myoview 9/12: EF 44, no ischemia // Myoview 8/18: EF 57, low risk, possible small area of inferior ischemia // Minor nonobstructive CAD by cardiac catheterization - [facility] 8/18: Proximal LAD 35, OM2 25  . Arthritis    knuckles  . Asthma   . Carotid artery disease ([place])    Carotid US 7/14: Bilateral ICA 40-59 // Carotid US ([facility]) 2/17: bilat plaque without sig stenosis  . Chronic diastolic CHF (congestive heart failure) ([place]) 07/26/2017   Echo 7/18:Severe conc LVH, EF 60-65, no RWMA, Gr 2 DD, MAC, mild MR // right and left heart cath 8/18: LVEDP 21  .  Claustrophobia 07/26/2012  . COPD (chronic obstructive pulmonary disease) ([place])    "chronic bronchitis - about every winter"  . CSF leak    dizziness/headache - assoc symptoms // 2/2 encephalocele s/p skull base reconstruction  . Diverticulosis   . Dyspnea   . History of CVA (cerebrovascular accident) 03/2004   denies residual 07/26/2012  . Hx of gout   . Hyperlipidemia    takes Pravastatin daily  . Hypertensive heart disease with chronic diastolic congestive heart failure ([place]) 07/26/2017  . Internal hemorrhoids   . Kidney stones   . LVH (left ventricular hypertrophy)    Echo 8/18: Severe conc LVH, EF 60-65, no RWMA, Gr 2 DD, MAC, mild MR  . OSA (obstructive sleep apnea) 07/26/2012   prior CPAP - stopped due to being primary caregiver for dying parent (stopped in 2016)  . PSVT (paroxysmal supraventricular tachycardia) ([place])   . Pulmonary hypertension, unspecified ([place]) 08/14/2017   cath 07/2017 >>PASP 56m Hg, PA mean 39 mm Hg  . Type II diabetes mellitus ([facility])     ROS:   All systems reviewed and negative except as noted in the HPI.   Past Surgical History:  Procedure Laterality Date  . ESOPHAGOGASTRODUODENOSCOPY (EGD) WITH PROPOFOL N/A 07/13/2015   Procedure: ESOPHAGOGASTRODUODENOSCOPY (EGD) WITH PROPOFOL;  Surgeon: PCarol Ada MD;  Location: [facility];  Service: Endoscopy;  Laterality: N/A;  . NASAL SINUS SURGERY  01/29/2012   Procedure: ENDOSCOPIC SINUS SURGERY WITH STEALTH;  Surgeon: MRuby Cola MD;  [facility]  Service: ENT;  Laterality: N/A;  . NM MYOVIEW LTD  08/25/2011   Low risk study, no evidence of ischemia, EF 44%  . REFRACTIVE SURGERY  ~ 2010   left  . RIGHT HEART CATH N/A 06/29/2020   Procedure: RIGHT HEART CATH;  Surgeon: BJolaine Artist MD;  [facility];  Service: Cardiovascular;  Laterality: N/A;  . RIGHT/LEFT HEART CATH AND CORONARY ANGIOGRAPHY N/A 07/30/2017   Procedure: RIGHT/LEFT HEART CATH AND CORONARY ANGIOGRAPHY;  Surgeon:  JMartinique Peter M, MD;  [facility];  Service: Cardiovascular;  Laterality: N/A;  . SPHENOIDECTOMY  01/29/2012   Procedure: SPHENOIDECTOMY;  Surgeon: MRuby Cola MD;  [facility]  Service: ENT;  Laterality: Left;  REPAIR OF LEFT SPHENOID    . SVT ABLATION N/A 05/21/2020   Procedure: SVT ABLATION;  Surgeon: TEvans Lance MD;  [facility];  Service: Cardiovascular;  Laterality: N/A;  . TOTAL ABDOMINAL HYSTERECTOMY  1994     Family History  Problem Relation Age of Onset  . Colon cancer Sister 459 . Breast cancer Mother 655 .  Alzheimer's disease Mother   . Diabetes Sister 82  . Heart failure Sister   . Diabetes Sister 42  . Lung cancer Father   . Breast cancer Sister   . Cancer Sister        mouth  . Anesthesia problems Neg Hx   . Hypotension Neg Hx   . Malignant hyperthermia Neg Hx   . Pseudochol deficiency Neg Hx      Social History   Socioeconomic History  . Marital status: Single    Spouse name: Not on file  . Number of children: 0  . Years of education: Not on file  . Highest education level: Not on file  Occupational History  . Occupation: disabled  Tobacco Use  . Smoking status: Former Smoker    Packs/day: 0.50    Years: 35.00    Pack years: 17.50    Types: Cigarettes    Quit date: 08/16/2019    Years since quitting: 0.9  . Smokeless tobacco: Never Used  Vaping Use  . Vaping Use: Never used  Substance and Sexual Activity  . Alcohol use: Yes    Alcohol/week: 1.0 standard drink    Types: 1 Glasses of wine per week    Comment: rarely  . Drug use: No  . Sexual activity: Not Currently    Birth control/protection: Surgical  Other Topics Concern  . Not on file  Social History Narrative   Currently unemployed.   No children, not married   Social Determinants of Radio broadcast assistant Strain:   . Difficulty of Paying Living Expenses: Not on file  Food Insecurity:   . Worried About Charity fundraiser in the Last  Year: Not on file  . Ran Out of Food in the Last Year: Not on file  Transportation Needs:   . Lack of Transportation (Medical): Not on file  . Lack of Transportation (Non-Medical): Not on file  Physical Activity:   . Days of Exercise per Week: Not on file  . Minutes of Exercise per Session: Not on file  Stress:   . Feeling of Stress : Not on file  Social Connections:   . Frequency of Communication with Friends and Family: Not on file  . Frequency of Social Gatherings with Friends and Family: Not on file  . Attends Religious Services: Not on file  . Active Member of Clubs or Organizations: Not on file  . Attends Archivist Meetings: Not on file  . Marital Status: Not on file  Intimate Partner Violence:   . Fear of Current or Ex-Partner: Not on file  . Emotionally Abused: Not on file  . Physically Abused: Not on file  . Sexually Abused: Not on file     BP 140/80   Pulse 67   Ht _0  (1.575 m)   Wt 261 lb (118.4 kg)   SpO2 99%   BMI 47.74 kg/m   Physical Exam:  Morbidly obese, chronically ill appearing NAD HEENT: Unremarkable Neck:  6 cm JVD, no thyromegally Lymphatics:  No adenopathy Back:  No CVA tenderness Lungs:  Clear with no wheezes or rhonchi HEART:  Regular rate rhythm, no murmurs, no rubs, no clicks Abd:  soft, positive bowel sounds, no organomegally, no rebound, no guarding Ext:  2 plus pulses, no edema, no cyanosis, no clubbing Skin:  No rashes no nodules Neuro:  CN II through XII intact, motor grossly intact  EKG nsr   Assess/Plan: 1. PAF - she is well controlled  on low dose flecainide. While I prefer not to use this medication in patients with other medical problems. The benefits at this point out weighs the risk. 2. Morbid obesity - this may be her biggest problem. She is encourage to lose weight. 3. Chronic systolic and diastolic heart failure - her symptoms are class 3B though her obesity is playing a role. She is up 10 lbs. I have asked  her to try once weekly zaroxolyn.  She will be supplemented with once a week Kdur.  4. COPD - she will remain on supplemental oxygen and bronchodilators.   Carleene Overlie Jacque Byron,MD

## 2020-08-10 ENCOUNTER — Telehealth: Payer: Self-pay | Admitting: Nurse Practitioner

## 2020-08-10 NOTE — Telephone Encounter (Signed)
Patient would like home health nursing once a week. Please call patient to discuss and set up.

## 2020-08-10 NOTE — Telephone Encounter (Signed)
LVM for patient to return call for more details in regards to her request for a home health nurse.

## 2020-08-14 NOTE — Telephone Encounter (Signed)
Pt states she needs help with medications and ADLs because since her sister passed away she has been trying to do things on her own and cannot. Pt states she would like someone to come in a few times a week to help her with things around the house and medications.

## 2020-08-14 NOTE — Telephone Encounter (Signed)
Please schedule video appt so we can discuss home health order and explain what can or cannot be done through home health. Thank you

## 2020-08-14 NOTE — Telephone Encounter (Signed)
Patient returned call to the office. Please give her a call back at [phone].

## 2020-08-15 NOTE — Telephone Encounter (Signed)
Pt notified to keep upcoming appointment on 08/17/20 to discuss home health and other issues she has. Pt verbalized understanding.

## 2020-08-17 ENCOUNTER — Telehealth (INDEPENDENT_AMBULATORY_CARE_PROVIDER_SITE_OTHER): Payer: Medicare Other | Admitting: Nurse Practitioner

## 2020-08-17 ENCOUNTER — Encounter: Payer: Self-pay | Admitting: Nurse Practitioner

## 2020-08-17 VITALS — Temp 97.5°F | Ht 62.0 in

## 2020-08-17 DIAGNOSIS — J449 Chronic obstructive pulmonary disease, unspecified: Secondary | ICD-10-CM

## 2020-08-17 DIAGNOSIS — I5042 Chronic combined systolic (congestive) and diastolic (congestive) heart failure: Secondary | ICD-10-CM | POA: Diagnosis not present

## 2020-08-17 DIAGNOSIS — Z9181 History of falling: Secondary | ICD-10-CM

## 2020-08-17 NOTE — Progress Notes (Signed)
Virtual Visit via Video Note  I connected with@ on 08/17/20 at  1:30 PM EDT by a video enabled telemedicine application and verified that I am speaking with the correct person using two identifiers.  Location: Patient:Home Provider: Office Participants: patient and provider  I discussed the limitations of evaluation and management by telemedicine and the availability of in person appointments. I also discussed with the patient that there may be a patient responsible charge related to this service. The patient expressed understanding and agreed to proceed.  CC: needs to discuss need for DMEs  History of Present Illness: [name] reports falls at home due to urinary frequency and SOB with exertion. She has urinary frequency due to increased diuretic dose. She denies any injury with falls at home. She plans to discuss need for cardiac rehab with cardiology to help improve endurance and stability. She decline need for home PT/OT/RN/nurse aid/SW.   Observations/Objective: Physical Exam Pulmonary:     Effort: Pulmonary effort is normal.  Neurological:     Mental Status: She is alert and oriented to person, place, and time.    Assessment and Plan: Angel was seen today for follow-up.  Diagnoses and all orders for this visit:  Chronic obstructive pulmonary disease, unspecified COPD type ([place]) -     DME Bedside commode -     For home use only DME Other see comment  Chronic combined systolic and diastolic heart failure ([place]) -     DME Bedside commode -     For home use only DME Other see comment  Risk for falls -     DME Bedside commode -     For home use only DME Other see comment   Follow Up Instructions: Discuss need for cardiac rehab with cardiology. DME orders will be faxed. You will be contacted to deliver equipment.   I discussed the assessment and treatment plan with the patient. The patient was provided an opportunity to ask questions and all were answered. The  patient agreed with the plan and demonstrated an understanding of the instructions.   The patient was advised to call back or seek an in-person evaluation if the symptoms worsen or if the condition fails to improve as anticipated.   Wilfred Lacy, NP

## 2020-08-21 ENCOUNTER — Telehealth: Payer: Self-pay | Admitting: Nurse Practitioner

## 2020-08-21 ENCOUNTER — Telehealth: Payer: Self-pay | Admitting: Pulmonary Disease

## 2020-08-21 ENCOUNTER — Ambulatory Visit: Payer: Medicare Other | Admitting: Internal Medicine

## 2020-08-21 NOTE — Telephone Encounter (Signed)
Notes faxed over

## 2020-08-21 NOTE — Telephone Encounter (Signed)
[facility] called requesting last office note on patient so they can get her supplies. Please fax to [phone] Attn: Referral Support.

## 2020-08-21 NOTE — Telephone Encounter (Signed)
Spoke with Bethanne Ginger at [facility], states that pt is not compliant with cpap and never followed up with our office after cpap setup as insurance requires.  Pt will need to "start over" with a new sleep study for insurance coverage.    It appears that pt is scheduled to see Dr. Jenetta Downer on 10/6, this was scheduled today.   atc pt X2, line rang to fast busy signal.  Wcb.

## 2020-08-22 NOTE — Telephone Encounter (Signed)
Pt called wondering if the info had been faxed over, I let her know it has

## 2020-08-23 NOTE — Telephone Encounter (Signed)
Pt returning a phone call. Pt can be reached at [phone].

## 2020-08-23 NOTE — Telephone Encounter (Signed)
Spoke with pt  She is aware of response per [employer] Will discuss options at upcoming ov per her request  Nothing further needed at this time

## 2020-08-23 NOTE — Telephone Encounter (Signed)
Left VM for pt to return call.

## 2020-09-02 DIAGNOSIS — Z23 Encounter for immunization: Secondary | ICD-10-CM | POA: Diagnosis not present

## 2020-09-07 ENCOUNTER — Encounter (HOSPITAL_COMMUNITY): Payer: Medicare Other | Admitting: Internal Medicine

## 2020-09-12 ENCOUNTER — Other Ambulatory Visit: Payer: Self-pay

## 2020-09-12 ENCOUNTER — Encounter: Payer: Self-pay | Admitting: Pulmonary Disease

## 2020-09-12 ENCOUNTER — Ambulatory Visit (INDEPENDENT_AMBULATORY_CARE_PROVIDER_SITE_OTHER): Payer: Medicare Other

## 2020-09-12 ENCOUNTER — Ambulatory Visit (INDEPENDENT_AMBULATORY_CARE_PROVIDER_SITE_OTHER): Payer: Medicare Other | Admitting: Pulmonary Disease

## 2020-09-12 VITALS — HR 90 | Temp 97.2°F | Ht 62.0 in | Wt 273.0 lb

## 2020-09-12 DIAGNOSIS — I10 Essential (primary) hypertension: Secondary | ICD-10-CM | POA: Diagnosis not present

## 2020-09-12 DIAGNOSIS — J811 Chronic pulmonary edema: Secondary | ICD-10-CM | POA: Diagnosis not present

## 2020-09-12 DIAGNOSIS — I509 Heart failure, unspecified: Secondary | ICD-10-CM | POA: Diagnosis not present

## 2020-09-12 DIAGNOSIS — R0689 Other abnormalities of breathing: Secondary | ICD-10-CM | POA: Diagnosis not present

## 2020-09-12 DIAGNOSIS — J90 Pleural effusion, not elsewhere classified: Secondary | ICD-10-CM | POA: Diagnosis not present

## 2020-09-12 DIAGNOSIS — R0600 Dyspnea, unspecified: Secondary | ICD-10-CM

## 2020-09-12 DIAGNOSIS — I517 Cardiomegaly: Secondary | ICD-10-CM | POA: Diagnosis not present

## 2020-09-12 DIAGNOSIS — R0602 Shortness of breath: Secondary | ICD-10-CM | POA: Diagnosis not present

## 2020-09-12 MED ORDER — METOLAZONE 2.5 MG PO TABS: 2.5000 mg | ORAL_TABLET | Freq: Every day | ORAL | 0 refills | Status: DC

## 2020-09-12 NOTE — Progress Notes (Signed)
[name]    [id number]    [birth year]  Primary Care Physician:Nche, Charlene Brooke, NP  Referring Physician: Flossie Buffy, NP [address] [place],  [place] [address]  Chief complaint:   Patient with worsening shortness of breath  HPI:  Has not been very compliant with CPAP Has not used CPAP for over 3 months, cannot remember the last time she used it  Has a history of congestive heart failure Paroxysmal A. fib SVT History of obstructive sleep apnea Obstructive lung disease Pulmonary hypertension Chronic systolic and diastolic heart failure History of PSVT Prior CVA Recent diastolic heart failure  Has been taking Demadex 40 mg daily and metolazone was added for 2.5 mg every week  Has gained about 10 pounds recently She does have type 2 diabetes  Patient never really started CPAP Need to start all over again for insurance coverage-as documented on 914   Most recent echocardiogram shows biventricular heart failure with left ventricular ejection fraction of 25 to 30% severely reduced right ventricular systolic function  Recent discharge weight was 236 pounds 06/29/2020  Outpatient Encounter Medications as of 09/12/2020  Medication Sig  . acetaminophen (TYLENOL) 325 MG tablet Take 2 tablets (650 mg total) by mouth every 6 (six) hours as needed for mild pain.  Marland Kitchen albuterol (VENTOLIN HFA) 108 (90 Base) MCG/ACT inhaler TAKE 2 PUFFS BY MOUTH EVERY 6 HOURS AS NEEDED FOR WHEEZE OR SHORTNESS OF BREATH  . allopurinol (ZYLOPRIM) 100 MG tablet Take 100 mg by mouth daily as needed (gout).   Marland Kitchen apixaban (ELIQUIS) 5 MG TABS tablet Take 1 tablet (5 mg total) by mouth 2 (two) times daily.  . Blood Glucose Monitoring Suppl (ONETOUCH VERIO) w/Device KIT USE TO CHECK BLOOD GLUCOSE 4 TIMES A DAY . DIAGNOSIS: TYPE 2 DIABETES E11.59  . fenofibrate 54 MG tablet Take 1 tablet (54 mg total) by mouth daily.  . flecainide (TAMBOCOR) 50 MG tablet Take 1 tablet (50  mg total) by mouth 2 (two) times daily.  Marland Kitchen FLUoxetine (PROZAC) 20 MG capsule Take 1 capsule (20 mg total) by mouth at bedtime.  . gabapentin (NEURONTIN) 400 MG capsule Take 1 capsule (400 mg total) by mouth at bedtime.  Marland Kitchen glucose blood (ONETOUCH VERIO) test strip For E11.22.  Use to check glucose qid  . hydrALAZINE (APRESOLINE) 50 MG tablet Take 1 tablet (50 mg total) by mouth every 8 (eight) hours.  . isosorbide mononitrate (IMDUR) 60 MG 24 hr tablet TAKE 1 TABLET (60 MG TOTAL) BY MOUTH DAILY FOR 30 DAYS. ** DO NOT CRUSH **  . metolazone (ZAROXOLYN) 2.5 MG tablet Take one tablet ONCE a week on Wednesday ONLY.  . nitroGLYCERIN (NITROSTAT) 0.4 MG SL tablet Place 1 tablet (0.4 mg total) under the tongue every 5 (five) minutes as needed.  Marland Kitchen omeprazole (PRILOSEC) 20 MG capsule Take 1 capsule (20 mg total) by mouth daily.  . potassium chloride (KLOR-CON) 10 MEQ tablet Take 2 tablets by mouth ONCE a week on Wednesday ONLY.  . rosuvastatin (CRESTOR) 20 MG tablet Take 20 mg by mouth at bedtime.  . topiramate (TOPAMAX) 25 MG tablet TAKE 1 TABLET BY MOUTH TWICE A DAY  . torsemide (DEMADEX) 20 MG tablet Take 2 tablets (40 mg total) by mouth daily.  . Verapamil HCl CR 300 MG CP24 TAKE 1 CAPSULE (300 MG TOTAL) BY MOUTH DAILY. PLEASE CALL TO SCHEDULE OVERDUE APPT WITH DR ROSS   No facility-administered encounter medications on file  as of 09/12/2020.    Allergies as of 09/12/2020 - Review Complete 09/12/2020  Allergen Reaction Noted  . Atorvastatin Other (See Comments) and Nausea And Vomiting 07/26/2012  . Ibuprofen Other (See Comments) 06/18/2017  . Metformin and related Nausea And Vomiting 07/26/2012  . Ace inhibitors Cough and Nausea And Vomiting 04/28/2012  . Hydrocodone-acetaminophen Nausea And Vomiting 06/19/2014  . Albuterol Other (See Comments) 07/30/2017    Past Medical History:  Diagnosis Date  . Anxiety    doesn't take any meds for this  . Arteriosclerosis of coronary artery 08/31/2011    Myoview 9/12: EF 44, no ischemia // Myoview 8/18: EF 57, low risk, possible small area of inferior ischemia // Minor nonobstructive CAD by cardiac catheterization - LHC 8/18: Proximal LAD 35, OM2 25  . Arthritis    knuckles  . Asthma   . Carotid artery disease ([place])    Carotid US 7/14: Bilateral ICA 40-59 // Carotid US ([facility]) 2/17: bilat plaque without sig stenosis  . Chronic diastolic CHF (congestive heart failure) ([place]) 07/26/2017   Echo 7/18:Severe conc LVH, EF 60-65, no RWMA, Gr 2 DD, MAC, mild MR // right and left heart cath 8/18: LVEDP 21  . Claustrophobia 07/26/2012  . COPD (chronic obstructive pulmonary disease) ([place])    "chronic bronchitis - about every winter"  . CSF leak    dizziness/headache - assoc symptoms // 2/2 encephalocele s/p skull base reconstruction  . Diverticulosis   . Dyspnea   . History of CVA (cerebrovascular accident) 03/2004   denies residual 07/26/2012  . Hx of gout   . Hyperlipidemia    takes Pravastatin daily  . Hypertensive heart disease with chronic diastolic congestive heart failure ([place]) 07/26/2017  . Internal hemorrhoids   . Kidney stones   . LVH (left ventricular hypertrophy)    Echo 8/18: Severe conc LVH, EF 60-65, no RWMA, Gr 2 DD, MAC, mild MR  . OSA (obstructive sleep apnea) 07/26/2012   prior CPAP - stopped due to being primary caregiver for dying parent (stopped in 2016)  . PSVT (paroxysmal supraventricular tachycardia) ([place])   . Pulmonary hypertension, unspecified ([place]) 08/14/2017   cath 07/2017 >>PASP 28m Hg, PA mean 39 mm Hg  . Type II diabetes mellitus ([place]     Past Surgical History:  Procedure Laterality Date  . ESOPHAGOGASTRODUODENOSCOPY (EGD) WITH PROPOFOL N/A 07/13/2015   Procedure: ESOPHAGOGASTRODUODENOSCOPY (EGD) WITH PROPOFOL;  Surgeon: PCarol Ada MD;  [facility]: [facility];  Service: Endoscopy;  Laterality: N/A;  . NASAL SINUS SURGERY  01/29/2012   Procedure: ENDOSCOPIC SINUS SURGERY WITH STEALTH;  Surgeon: MRuby Cola  MD;  [facility]  Service: ENT;  Laterality: N/A;  . NM MYOVIEW LTD  08/25/2011   Low risk study, no evidence of ischemia, EF 44%  . REFRACTIVE SURGERY  ~ 2010   left  . RIGHT HEART CATH N/A 06/29/2020   Procedure: RIGHT HEART CATH;  Surgeon: BJolaine Artist MD;  [facility];  Service: Cardiovascular;  Laterality: N/A;  . RIGHT/LEFT HEART CATH AND CORONARY ANGIOGRAPHY N/A 07/30/2017   Procedure: RIGHT/LEFT HEART CATH AND CORONARY ANGIOGRAPHY;  Surgeon: JMartinique Peter M, MD;  [facility];  Service: Cardiovascular;  Laterality: N/A;  . SPHENOIDECTOMY  01/29/2012   Procedure: SPHENOIDECTOMY;  Surgeon: MRuby Cola MD;  [facility]  Service: ENT;  Laterality: Left;  REPAIR OF LEFT SPHENOID    . SVT ABLATION N/A 05/21/2020   Procedure: SVT ABLATION;  Surgeon: TEvans Lance  MD;  Location: [facility];  Service: Cardiovascular;  Laterality: N/A;  . TOTAL ABDOMINAL HYSTERECTOMY  1994    Family History  Problem Relation Age of Onset  . Colon cancer Sister 45  . Breast cancer Mother 18  . Alzheimer's disease Mother   . Diabetes Sister 84  . Heart failure Sister   . Diabetes Sister 43  . Lung cancer Father   . Breast cancer Sister   . Cancer Sister        mouth  . Anesthesia problems Neg Hx   . Hypotension Neg Hx   . Malignant hyperthermia Neg Hx   . Pseudochol deficiency Neg Hx     Social History   Socioeconomic History  . Marital status: Single    Spouse name: Not on file  . Number of children: 0  . Years of education: Not on file  . Highest education level: Not on file  Occupational History  . Occupation: disabled  Tobacco Use  . Smoking status: Former Smoker    Packs/day: 0.50    Years: 35.00    Pack years: 17.50    Types: Cigarettes    Quit date: 08/16/2019    Years since quitting: 1.0  . Smokeless tobacco: Never Used  Vaping Use  . Vaping Use: Never used  Substance and Sexual Activity  . Alcohol use: Yes     Alcohol/week: 1.0 standard drink    Types: 1 Glasses of wine per week    Comment: rarely  . Drug use: No  . Sexual activity: Not Currently    Birth control/protection: Surgical  Other Topics Concern  . Not on file  Social History Narrative   Currently unemployed.   No children, not married   Social Determinants of Radio broadcast assistant Strain:   . Difficulty of Paying Living Expenses: Not on file  Food Insecurity:   . Worried About Charity fundraiser in the Last Year: Not on file  . Ran Out of Food in the Last Year: Not on file  Transportation Needs:   . Lack of Transportation (Medical): Not on file  . Lack of Transportation (Non-Medical): Not on file  Physical Activity:   . Days of Exercise per Week: Not on file  . Minutes of Exercise per Session: Not on file  Stress:   . Feeling of Stress : Not on file  Social Connections:   . Frequency of Communication with Friends and Family: Not on file  . Frequency of Social Gatherings with Friends and Family: Not on file  . Attends Religious Services: Not on file  . Active Member of Clubs or Organizations: Not on file  . Attends Archivist Meetings: Not on file  . Marital Status: Not on file  Intimate Partner Violence:   . Fear of Current or Ex-Partner: Not on file  . Emotionally Abused: Not on file  . Physically Abused: Not on file  . Sexually Abused: Not on file    Review of Systems  Constitutional: Positive for fatigue and unexpected weight change.  Respiratory: Positive for apnea.   Psychiatric/Behavioral: Positive for sleep disturbance.    Vitals:   09/12/20 1635  Pulse: 90  Temp: (!) 97.2 F (36.2 C)  SpO2: 97%     Physical Exam Constitutional:      Appearance: She is obese.  HENT:     Nose: No congestion.     Mouth/Throat:     Pharynx: No oropharyngeal exudate.  Eyes:  General:        Right eye: No discharge.  Cardiovascular:     Rate and Rhythm: Normal rate and regular rhythm.      Pulses: Normal pulses.     Heart sounds: Normal heart sounds. No murmur heard.  No friction rub.  Pulmonary:     Effort: Pulmonary effort is normal. No respiratory distress.     Breath sounds: Normal breath sounds. No stridor. No wheezing or rhonchi.  Musculoskeletal:     Cervical back: No rigidity or tenderness.  Neurological:     Mental Status: She is alert.  Psychiatric:        Mood and Affect: Mood normal.    Data Reviewed: Chest x-ray today shows pulmonary vascular congestion and cardiomegaly  Assessment:  Shortness of breath Systolic and diastolic heart failure Obstructive sleep apnea-noncompliant with CPAP therapy recent hypercapnic/hypoxemic respiratory failure Type 2 diabetes Pulmonary hypertension Chronic obstructive pulmonary disease Chronic kidney disease stage III  History of noncompliance  Noncompliance with CPAP therapy As she had not used CPAP at all we will need an in lab polysomnogram with titration  Plan/Recommendations: Plan will be to start metolazone 2.5 daily To consider Demadex Obtain Chem-7 and CBC Chest x-ray obtained today and reviewed  Patient encouraged to go to the emergency room if shortness of breath does not start to improve  Importance of compliance was discussed with her and her daughter today  Risk of decompensation is high  We will follow-up labs   Sherrilyn Rist MD [facility] 09/12/2020, 5:12 PM  CC: Nche, Charlene Brooke, NP

## 2020-09-12 NOTE — Patient Instructions (Addendum)
Shortness of breath Weight gain  We will increase her metolazone to daily 2.5 mg daily  Continue with your Demadex as you are taking it currently  We will obtain chest x-ray Obtain CBC and Chem-7  We will contact [facility] to figure out what we can do about your CPAP but I am afraid you may have to go in for sleep study  Continue oxygen supplementation trying to keep the saturations greater than 90%  We will repeat your Chem-7 on Friday  If you are still very concerned about not feeling well I will recommend to go to the emergency department

## 2020-09-13 ENCOUNTER — Other Ambulatory Visit (INDEPENDENT_AMBULATORY_CARE_PROVIDER_SITE_OTHER): Payer: Medicare Other

## 2020-09-13 DIAGNOSIS — R0600 Dyspnea, unspecified: Secondary | ICD-10-CM

## 2020-09-13 DIAGNOSIS — R0689 Other abnormalities of breathing: Secondary | ICD-10-CM

## 2020-09-13 LAB — CBC WITH DIFFERENTIAL/PLATELET
Basophils Absolute: 0 10*3/uL (ref 0.0–0.1)
Basophils Relative: 0.4 % (ref 0.0–3.0)
Eosinophils Absolute: 0.2 10*3/uL (ref 0.0–0.7)
Eosinophils Relative: 2.7 % (ref 0.0–5.0)
HCT: 40.1 % (ref 36.0–46.0)
Hemoglobin: 12.5 g/dL (ref 12.0–15.0)
Lymphocytes Relative: 19.4 % (ref 12.0–46.0)
Lymphs Abs: 1.1 10*3/uL (ref 0.7–4.0)
MCHC: 31.1 g/dL (ref 30.0–36.0)
MCV: 88.8 fl (ref 78.0–100.0)
Monocytes Absolute: 0.4 10*3/uL (ref 0.1–1.0)
Monocytes Relative: 6.3 % (ref 3.0–12.0)
Neutro Abs: 4 10*3/uL (ref 1.4–7.7)
Neutrophils Relative %: 71.2 % (ref 43.0–77.0)
Platelets: 169 10*3/uL (ref 150.0–400.0)
RBC: 4.51 Mil/uL (ref 3.87–5.11)
RDW: 17.2 % — ABNORMAL HIGH (ref 11.5–15.5)
WBC: 5.6 10*3/uL (ref 4.0–10.5)

## 2020-09-13 LAB — BASIC METABOLIC PANEL
BUN: 31 mg/dL — ABNORMAL HIGH (ref 6–23)
CO2: 32 mEq/L (ref 19–32)
Calcium: 9.1 mg/dL (ref 8.4–10.5)
Chloride: 104 mEq/L (ref 96–112)
Creatinine, Ser: 1.92 mg/dL — ABNORMAL HIGH (ref 0.40–1.20)
GFR: 27.15 mL/min — ABNORMAL LOW (ref >60.00)
Glucose, Bld: 121 mg/dL — ABNORMAL HIGH (ref 70–99)
Potassium: 4 mEq/L (ref 3.5–5.1)
Sodium: 144 mEq/L (ref 135–145)

## 2020-09-14 ENCOUNTER — Encounter: Payer: Self-pay | Admitting: Pulmonary Disease

## 2020-09-14 ENCOUNTER — Telehealth: Payer: Self-pay | Admitting: Pulmonary Disease

## 2020-09-14 ENCOUNTER — Other Ambulatory Visit: Payer: Self-pay | Admitting: Pulmonary Disease

## 2020-09-14 DIAGNOSIS — G4733 Obstructive sleep apnea (adult) (pediatric): Secondary | ICD-10-CM

## 2020-09-14 NOTE — Telephone Encounter (Signed)
Please schedule patient for split-night sleep study for obstructive sleep apnea  She has a previous diagnosis of sleep apnea but has not been using CPAP

## 2020-09-14 NOTE — Telephone Encounter (Signed)
Called and left message for patient about her blood work which is within normal limits  On increased doses of diuretics  Encouraged to call back Monday or Tuesday  Spoke with pt and notified of results per Dr. Ander Slade. Pt verbalized understanding and denied any questions.

## 2020-09-14 NOTE — Telephone Encounter (Signed)
Called and left message for patient about her blood work which is within normal limits  On increased doses of diuretics  Encouraged to call back Monday or Tuesday

## 2020-09-26 DIAGNOSIS — Z23 Encounter for immunization: Secondary | ICD-10-CM | POA: Diagnosis not present

## 2020-10-06 ENCOUNTER — Other Ambulatory Visit (HOSPITAL_COMMUNITY): Payer: Self-pay | Admitting: Cardiology

## 2020-10-06 ENCOUNTER — Other Ambulatory Visit: Payer: Self-pay | Admitting: Family Medicine

## 2020-10-06 DIAGNOSIS — F331 Major depressive disorder, recurrent, moderate: Secondary | ICD-10-CM

## 2020-10-07 NOTE — Telephone Encounter (Signed)
Forwarding to PCP for review.  

## 2020-10-11 ENCOUNTER — Other Ambulatory Visit: Payer: Self-pay

## 2020-10-12 ENCOUNTER — Ambulatory Visit (INDEPENDENT_AMBULATORY_CARE_PROVIDER_SITE_OTHER): Payer: Medicare Other | Admitting: Nurse Practitioner

## 2020-10-12 ENCOUNTER — Encounter: Payer: Self-pay | Admitting: Nurse Practitioner

## 2020-10-12 VITALS — BP 140/90 | HR 92 | Temp 97.0°F | Wt 264.3 lb

## 2020-10-12 DIAGNOSIS — I5042 Chronic combined systolic (congestive) and diastolic (congestive) heart failure: Secondary | ICD-10-CM | POA: Diagnosis not present

## 2020-10-12 DIAGNOSIS — M6281 Muscle weakness (generalized): Secondary | ICD-10-CM

## 2020-10-12 DIAGNOSIS — J449 Chronic obstructive pulmonary disease, unspecified: Secondary | ICD-10-CM

## 2020-10-12 DIAGNOSIS — E782 Mixed hyperlipidemia: Secondary | ICD-10-CM | POA: Diagnosis not present

## 2020-10-12 DIAGNOSIS — E1142 Type 2 diabetes mellitus with diabetic polyneuropathy: Secondary | ICD-10-CM | POA: Diagnosis not present

## 2020-10-12 DIAGNOSIS — Z794 Long term (current) use of insulin: Secondary | ICD-10-CM | POA: Diagnosis not present

## 2020-10-12 NOTE — Progress Notes (Signed)
Subjective:  Patient ID: [name], female    DOB: 1956/03/20  Age: 64 y.o. MRN: [id number]  CC: Follow-up (6 month f/u on DM, HTN, and cholesterol. Pt is fasting x6 hours. Declined flu vaccine today)  HPI  Mixed hyperlipidemia No adverse effects with crestor and fenofibrate Lipid Panel     Component Value Date/Time   CHOL 209 (H) 10/12/2020 1450   TRIG 116 10/12/2020 1450   HDL 46 (L) 10/12/2020 1450   CHOLHDL 4.5 10/12/2020 1450   VLDL 41.8 (H) 04/11/2020 0854   LDLCALC 140 (H) 10/12/2020 1450   LDLDIRECT 121.0 04/11/2020 0854   Improved triglyceride: stop fenofibrate and increase crestor to 61m. New rx sent F/up 611month Type 2 diabetes mellitus with diabetic polyneuropathy (HCPageHome glucose: 90-120 Has not needed insulin Advised to schedule annual eye exam with ophthalmologist.  Repeat HgbA1c  Wt Readings from Last 3 Encounters:  10/12/20 264 lb 4.8 oz (119.9 kg)  09/12/20 273 lb (123.8 kg)  08/08/20 261 lb (118.4 kg)   Reviewed past Medical, Social and Family history today.  Outpatient Medications Prior to Visit  Medication Sig Dispense Refill   acetaminophen (TYLENOL) 325 MG tablet Take 2 tablets (650 mg total) by mouth every 6 (six) hours as needed for mild pain. 90 tablet 2   albuterol (VENTOLIN HFA) 108 (90 Base) MCG/ACT inhaler TAKE 2 PUFFS BY MOUTH EVERY 6 HOURS AS NEEDED FOR WHEEZE OR SHORTNESS OF BREATH 18 g 1   allopurinol (ZYLOPRIM) 100 MG tablet Take 100 mg by mouth daily as needed (gout).      apixaban (ELIQUIS) 5 MG TABS tablet Take 1 tablet (5 mg total) by mouth 2 (two) times daily. 60 tablet 11   Blood Glucose Monitoring Suppl (ONETOUCH VERIO) w/Device KIT USE TO CHECK BLOOD GLUCOSE 4 TIMES A DAY . DIAGNOSIS: TYPE 2 DIABETES E11.59 1 kit 0   flecainide (TAMBOCOR) 50 MG tablet Take 1 tablet (50 mg total) by mouth 2 (two) times daily. 180 tablet 3   FLUoxetine (PROZAC) 20 MG capsule Take 1 capsule (20 mg total) by mouth at  bedtime. 90 capsule 3   gabapentin (NEURONTIN) 400 MG capsule Take 1 capsule (400 mg total) by mouth at bedtime. 90 capsule 3   glucose blood (ONETOUCH VERIO) test strip For E11.22.  Use to check glucose qid 100 strip 12   hydrALAZINE (APRESOLINE) 50 MG tablet TAKE 1 TABLET BY MOUTH EVERY 8 HOURS. 270 tablet 3   isosorbide mononitrate (IMDUR) 60 MG 24 hr tablet TAKE 1 TABLET (60 MG TOTAL) BY MOUTH DAILY FOR 30 DAYS. ** DO NOT CRUSH ** 90 tablet 0   metolazone (ZAROXOLYN) 2.5 MG tablet Take one tablet ONCE a week on Wednesday ONLY. 12 tablet 3   nitroGLYCERIN (NITROSTAT) 0.4 MG SL tablet Place 1 tablet (0.4 mg total) under the tongue every 5 (five) minutes as needed. 25 tablet 3   omeprazole (PRILOSEC) 20 MG capsule Take 1 capsule (20 mg total) by mouth daily. 90 capsule 1   potassium chloride (KLOR-CON) 10 MEQ tablet Take 2 tablets by mouth ONCE a week on Wednesday ONLY. 24 tablet 3   topiramate (TOPAMAX) 25 MG tablet TAKE 1 TABLET BY MOUTH TWICE A DAY 180 tablet 1   torsemide (DEMADEX) 20 MG tablet Take 2 tablets (40 mg total) by mouth daily. 60 tablet 6   Verapamil HCl CR 300 MG CP24 TAKE 1 CAPSULE (300 MG TOTAL) BY MOUTH DAILY. PLEASE CALL TO SCHEDULE OVERDUE APPT  WITH DR ROSS 90 capsule 1   fenofibrate 54 MG tablet Take 1 tablet (54 mg total) by mouth daily. 90 tablet 3   rosuvastatin (CRESTOR) 20 MG tablet Take 20 mg by mouth at bedtime.     metolazone (ZAROXOLYN) 2.5 MG tablet Take 1 tablet (2.5 mg total) by mouth daily for 7 days. 7 tablet 0   No facility-administered medications prior to visit.    ROS See HPI  Objective:  BP 140/90 (BP Location: Left Arm, Patient Position: Sitting, Cuff Size: Large)    Pulse 92    Temp (!) 97 F (36.1 C) (Temporal)    Wt 264 lb 4.8 oz (119.9 kg)    SpO2 95%    BMI 48.34 kg/m   Physical Exam Constitutional:      General: She is not in acute distress.    Appearance: She is obese.  Cardiovascular:     Rate and Rhythm: Normal rate  and regular rhythm.     Pulses: Normal pulses.     Heart sounds: Normal heart sounds.  Pulmonary:     Effort: Pulmonary effort is normal.     Breath sounds: Normal breath sounds.  Musculoskeletal:     Right lower leg: Edema present.     Left lower leg: Edema present.  Neurological:     Mental Status: She is alert and oriented to person, place, and time.  Psychiatric:        Mood and Affect: Mood normal.        Behavior: Behavior normal.        Thought Content: Thought content normal.    Assessment & Plan:  This visit occurred during the SARS-CoV-2 public health emergency.  Safety protocols were in place, including screening questions prior to the visit, additional usage of staff PPE, and extensive cleaning of exam room while observing appropriate contact time as indicated for disinfecting solutions.   [name] was seen today for follow-up.  Diagnoses and all orders for this visit:  Type 2 diabetes mellitus with diabetic polyneuropathy, with long-term current use of insulin (HCC) -     Hemoglobin A1c  Mixed hyperlipidemia -     Lipid panel -     rosuvastatin (CRESTOR) 40 MG tablet; Take 1 tablet (40 mg total) by mouth at bedtime.  Chronic obstructive pulmonary disease, unspecified COPD type ([place]) -     Ambulatory referral to Home Health  Chronic combined systolic and diastolic heart failure ([place]) -     Ambulatory referral to Home Health  Generalized muscle weakness -     Ambulatory referral to [facility]    Problem List Items Addressed This Visit      Cardiovascular and Mediastinum   Heart failure ([place])   Relevant Medications   rosuvastatin (CRESTOR) 40 MG tablet   Other Relevant Orders   Ambulatory referral to [facility]     Respiratory   COPD (chronic obstructive pulmonary disease) ([place])   Relevant Orders   Ambulatory referral to Home Health     Endocrine   Type 2 diabetes mellitus with diabetic polyneuropathy ([place]) - Primary    Home glucose: 90-120 Has  not needed insulin Advised to schedule annual eye exam with ophthalmologist.  Repeat HgbA1c      Relevant Medications   rosuvastatin (CRESTOR) 40 MG tablet   Other Relevant Orders   Hemoglobin A1c (Completed)     Other   Mixed hyperlipidemia    No adverse effects with crestor and fenofibrate Lipid Panel  Component Value Date/Time   CHOL 209 (H) 10/12/2020 1450   TRIG 116 10/12/2020 1450   HDL 46 (L) 10/12/2020 1450   CHOLHDL 4.5 10/12/2020 1450   VLDL 41.8 (H) 04/11/2020 0854   LDLCALC 140 (H) 10/12/2020 1450   LDLDIRECT 121.0 04/11/2020 0854   Improved triglyceride: stop fenofibrate and increase crestor to 69m. New rx sent F/up 637month     Relevant Medications   rosuvastatin (CRESTOR) 40 MG tablet   Other Relevant Orders   Lipid panel (Completed)    Other Visit Diagnoses    Generalized muscle weakness       Relevant Orders   Ambulatory referral to Home Health      Follow-up: Return in about 6 months (around 04/11/2021) for DM and , hyperlipidemia (3087m, need pap and complete PHQ/GAD form).  ChaWilfred LacyP

## 2020-10-12 NOTE — Assessment & Plan Note (Addendum)
No adverse effects with crestor and fenofibrate Lipid Panel     Component Value Date/Time   CHOL 209 (H) 10/12/2020 1450   TRIG 116 10/12/2020 1450   HDL 46 (L) 10/12/2020 1450   CHOLHDL 4.5 10/12/2020 1450   VLDL 41.8 (H) 04/11/2020 0854   LDLCALC 140 (H) 10/12/2020 1450   LDLDIRECT 121.0 04/11/2020 0854   Improved triglyceride: stop fenofibrate and increase crestor to 40mg . New rx sent F/up 8months

## 2020-10-12 NOTE — Patient Instructions (Addendum)
Stable HgbA1c at 6.1. no change in medication Abnormal lipid panel: stop fenofibrate and increase crestor to 40mg . New rx sent Pulmonology and cardiology agreed to home OT and PT. Order entered  You need an annual diabetics eye exam.  You are due for colonoscopy, but need clearance from cardiology and pulmonologist prior to scheduling this appt.

## 2020-10-12 NOTE — Assessment & Plan Note (Signed)
Home glucose: 90-120 Has not needed insulin Advised to schedule annual eye exam with ophthalmologist.  Repeat HgbA1c

## 2020-10-13 LAB — LIPID PANEL
Cholesterol: 209 mg/dL — ABNORMAL HIGH (ref <200)
HDL: 46 mg/dL — ABNORMAL LOW (ref >=50)
LDL Cholesterol (Calc): 140 mg/dL (calc) — ABNORMAL HIGH
Non-HDL Cholesterol (Calc): 163 mg/dL (calc) — ABNORMAL HIGH (ref <130)
Total CHOL/HDL Ratio: 4.5 (calc) (ref <5.0)
Triglycerides: 116 mg/dL (ref <150)

## 2020-10-13 LAB — HEMOGLOBIN A1C
Hgb A1c MFr Bld: 6.1 % of total Hgb — ABNORMAL HIGH (ref <5.7)
Mean Plasma Glucose: 128 (calc)
eAG (mmol/L): 7.1 (calc)

## 2020-10-15 ENCOUNTER — Ambulatory Visit: Payer: Medicare Other | Admitting: Podiatry

## 2020-10-16 ENCOUNTER — Encounter: Payer: Self-pay | Admitting: Nurse Practitioner

## 2020-10-16 MED ORDER — ROSUVASTATIN CALCIUM 40 MG PO TABS: 40.0000 mg | ORAL_TABLET | Freq: Every day | ORAL | 3 refills | Status: AC

## 2020-10-22 DIAGNOSIS — J449 Chronic obstructive pulmonary disease, unspecified: Secondary | ICD-10-CM | POA: Diagnosis not present

## 2020-10-23 ENCOUNTER — Ambulatory Visit (HOSPITAL_COMMUNITY)
Admission: RE | Admit: 2020-10-23 | Discharge: 2020-10-23 | Disposition: A | Discharge status: Home / Self Care | Payer: Medicare Other | Source: Ambulatory Visit | Attending: Internal Medicine | Admitting: Internal Medicine

## 2020-10-23 ENCOUNTER — Ambulatory Visit (HOSPITAL_COMMUNITY): Admission: RE | Admit: 2020-10-23 | Payer: Medicare Other | Source: Ambulatory Visit | Admitting: Internal Medicine

## 2020-10-23 ENCOUNTER — Encounter (HOSPITAL_COMMUNITY): Payer: Self-pay | Admitting: Internal Medicine

## 2020-10-23 ENCOUNTER — Telehealth: Payer: Self-pay

## 2020-10-23 ENCOUNTER — Other Ambulatory Visit: Payer: Self-pay

## 2020-10-23 ENCOUNTER — Encounter (HOSPITAL_COMMUNITY): Payer: Self-pay

## 2020-10-23 VITALS — BP 122/90 | HR 81 | Wt 270.6 lb

## 2020-10-23 DIAGNOSIS — Z87891 Personal history of nicotine dependence: Secondary | ICD-10-CM | POA: Diagnosis not present

## 2020-10-23 DIAGNOSIS — I5042 Chronic combined systolic (congestive) and diastolic (congestive) heart failure: Secondary | ICD-10-CM | POA: Diagnosis not present

## 2020-10-23 DIAGNOSIS — Z7901 Long term (current) use of anticoagulants: Secondary | ICD-10-CM | POA: Insufficient documentation

## 2020-10-23 DIAGNOSIS — I428 Other cardiomyopathies: Secondary | ICD-10-CM | POA: Insufficient documentation

## 2020-10-23 DIAGNOSIS — I2510 Atherosclerotic heart disease of native coronary artery without angina pectoris: Secondary | ICD-10-CM | POA: Diagnosis not present

## 2020-10-23 DIAGNOSIS — I272 Pulmonary hypertension, unspecified: Secondary | ICD-10-CM

## 2020-10-23 DIAGNOSIS — R002 Palpitations: Secondary | ICD-10-CM

## 2020-10-23 DIAGNOSIS — I5023 Acute on chronic systolic (congestive) heart failure: Secondary | ICD-10-CM

## 2020-10-23 DIAGNOSIS — Z6841 Body Mass Index (BMI) 40.0 and over, adult: Secondary | ICD-10-CM | POA: Diagnosis not present

## 2020-10-23 DIAGNOSIS — E1122 Type 2 diabetes mellitus with diabetic chronic kidney disease: Secondary | ICD-10-CM | POA: Diagnosis not present

## 2020-10-23 DIAGNOSIS — G4733 Obstructive sleep apnea (adult) (pediatric): Secondary | ICD-10-CM | POA: Insufficient documentation

## 2020-10-23 DIAGNOSIS — I130 Hypertensive heart and chronic kidney disease with heart failure and stage 1 through stage 4 chronic kidney disease, or unspecified chronic kidney disease: Secondary | ICD-10-CM | POA: Insufficient documentation

## 2020-10-23 DIAGNOSIS — J449 Chronic obstructive pulmonary disease, unspecified: Secondary | ICD-10-CM | POA: Insufficient documentation

## 2020-10-23 DIAGNOSIS — N183 Chronic kidney disease, stage 3 unspecified: Secondary | ICD-10-CM | POA: Diagnosis not present

## 2020-10-23 DIAGNOSIS — I2721 Secondary pulmonary arterial hypertension: Secondary | ICD-10-CM | POA: Insufficient documentation

## 2020-10-23 DIAGNOSIS — J9611 Chronic respiratory failure with hypoxia: Secondary | ICD-10-CM | POA: Insufficient documentation

## 2020-10-23 DIAGNOSIS — I5082 Biventricular heart failure: Secondary | ICD-10-CM | POA: Diagnosis not present

## 2020-10-23 DIAGNOSIS — Z8673 Personal history of transient ischemic attack (TIA), and cerebral infarction without residual deficits: Secondary | ICD-10-CM | POA: Insufficient documentation

## 2020-10-23 DIAGNOSIS — I471 Supraventricular tachycardia: Secondary | ICD-10-CM | POA: Diagnosis not present

## 2020-10-23 DIAGNOSIS — E785 Hyperlipidemia, unspecified: Secondary | ICD-10-CM | POA: Diagnosis not present

## 2020-10-23 DIAGNOSIS — Z79899 Other long term (current) drug therapy: Secondary | ICD-10-CM | POA: Diagnosis not present

## 2020-10-23 DIAGNOSIS — I5032 Chronic diastolic (congestive) heart failure: Secondary | ICD-10-CM

## 2020-10-23 NOTE — Progress Notes (Signed)
ReDS Vest / Clip - 10/23/20 1500      ReDS Vest / Clip   Station Marker C    Ruler Value 35    ReDS Value Range High volume overload    ReDS Actual Value 45    Anatomical Comments sitting

## 2020-10-23 NOTE — Progress Notes (Signed)
Called Reche Dixon, RN w/[facility] and gave referral, she states they will see pt today by 6 pm and start IV Lasix

## 2020-10-23 NOTE — Patient Instructions (Signed)
[facility] will be out to see you later today and administer IV Lasix and will be seeing you once or twice a day   Your provider has recommended that  you wear a Zio Patch for 14 days.  This monitor will record your heart rhythm for our review.  IF you have any symptoms while wearing the monitor please press the button.  If you have any issues with the patch or you notice a red or orange light on it please call the company at [phone].  Once you remove the patch please mail it back to the company as soon as possible so we can get the results.  Your physician recommends that you schedule a follow-up appointment in: 3 weeks  If you have any questions or concerns before your next appointment please send [place] a message through [employer] or call our office at [phone].    TO LEAVE A MESSAGE FOR THE NURSE SELECT OPTION 2, PLEASE LEAVE A MESSAGE INCLUDING:  YOUR NAME  DATE OF BIRTH  CALL BACK NUMBER  REASON FOR CALL**this is important as we prioritize the call backs  YOU WILL RECEIVE A CALL BACK THE SAME DAY AS LONG AS YOU CALL BEFORE 4:00 PM  At the [facility], you and your health needs are our priority. As part of our continuing mission to provide you with exceptional heart care, we have created designated Provider Care Teams. These Care Teams include your primary Cardiologist (physician) and Advanced Practice Providers (APPs- Physician Assistants and Nurse Practitioners) who all work together to provide you with the care you need, when you need it.   You may see any of the following providers on your designated Care Team at your next follow up:  Dr Glori Bickers  Dr Haynes Kerns, NP  Lyda Jester, [place]  Audry Riles, PharmD   Please be sure to bring in all your medications bottles to every appointment.

## 2020-10-23 NOTE — Patient Instructions (Signed)
[facility] will be out to see you later today and administer IV Lasix and will be seeing you once or twice a day   Your provider has recommended that  you wear a Zio Patch for 14 days.  This monitor will record your heart rhythm for our review.  IF you have any symptoms while wearing the monitor please press the button.  If you have any issues with the patch or you notice a red or orange light on it please call the company at [phone].  Once you remove the patch please mail it back to the company as soon as possible so we can get the results.  Your physician recommends that you schedule a follow-up appointment in: 3 weeks  If you have any questions or concerns before your next appointment please send [place] a message through [place] or call our office at [phone].    TO LEAVE A MESSAGE FOR THE NURSE SELECT OPTION 2, PLEASE LEAVE A MESSAGE INCLUDING: . YOUR NAME . DATE OF BIRTH . CALL BACK NUMBER . REASON FOR CALL**this is important as we prioritize the call backs  [place] AS LONG AS YOU CALL BEFORE 4:00 PM  At the [facility], you and your health needs are our priority. As part of our continuing mission to provide you with exceptional heart care, we have created designated Provider Care Teams. These Care Teams include your primary Cardiologist (physician) and Advanced Practice Providers (APPs- Physician Assistants and Nurse Practitioners) who all work together to provide you with the care you need, when you need it.   You may see any of the following providers on your designated Care Team at your next follow up: Marland Kitchen Dr Glori Bickers . Dr Loralie Champagne . Darrick Grinder, NP . Lyda Jester, PA . Audry Riles, PharmD   Please be sure to bring in all your medications bottles to every appointment.

## 2020-10-23 NOTE — Progress Notes (Addendum)
ADVANCED HF CLINIC NOTE  PCP: Primary Cardiologist: Dr [name] HF MD; Dr [name]  Reason for Visit: f/u for Chronic Systolic HF. [name]u for a/c CHF.   HPI: [name] a 64 y.o.femalewith a hx of morbid obesity, chronic systolic/diastolic CHF, PSVT, prior CVA, HTN, DM, OSA, CKD with baseline creatinine 1.3-1.4, COPD with ongoing tobacco, pulmonary hypertensionand CSF rhinorrhea s/p skull base reconstruction.   Admitted with A/C diastolic heart failure. > 30 pound weight gain in a few weeks after her  PCP discontinued lasix  06/27/2019 secondary to worsening kidney function. In ER markedly volume overloaded. [name] was placed to guide therapy with initial swan numbers showing cardiac index 1.6 and elevated filling pressures. ECHO completed and showed biventricular HF with LVEF 25-30 and RV severely reduced. Placed on milrinone and diuresed with IV lasix. As she improved milrinone was weaned off. HF medications adjusted. Discharge weight was 236 pounds.   Echo 1/21 showed  EF 45-50% with mod RV dysfunction. Grade 2 DD. MV is thickened with mild MR/MS  Zio patch placed 1/21 for palpitations and showed frequent runs of SVT. Was referred to Dr. Lovena [name] w/ EP. Plan is for SVT ablation in June.   Admitted 4/21 for ADHF. Hospital course c/b AKI. SCr spiked to 3.3. [name] was discontinued.  SCr improved, down to 1.78 day of discharge.    [name] on 06/29/20. Torsemide increased.  RA = 16 RV = 72/11 (22) PA = 70/30 (48) PCW = 25 Fick cardiac output/index = 4.0/1.9 Thermo CO/CI = 5.2/2.5 PVR = 5.7 (Fick) 4.4 (Thermo) Ao sat = 98% PA sat = 56%, 57%  She presents to clinic today for f/u. She is here w/ her friend [name]. Says she has been trying to limit her fluids to 32 ounces.  But still having severe [name] edema ReDS 45% Very SOB with any exertion. Hard to get around. Asking for Foley catheter because she cant make it to the bathroom. Not wearing CPAP. Denies CP. + Orthopnea.       Cardiac Testing Echo 08/18/19 EF 25-30% Mod LVH. RV dilated and severely HK. Personally reviewed Echo 1/21 EF 45-50% with mod RV dysfunction. Grade 2 DD. MV is thickened with mild MR/MS  RHC/LHC 07/31/2019  Cardiac catheterization demonstrated minor nonobstructive CAD, mildly elevated LVEDP and moderate pulmonary hypertension. LAD 35% OM2 25% RA 10 PA 57/22 (39) PCWP 19 LVEDP 21 Fick 3.7/1.7   ROS: All systems negative except as listed in HPI, PMH and Problem List.  SH:  Social History   Socioeconomic History  . Marital status: Single    Spouse name: Not on file  . Number of children: 0  . Years of education: Not on file  . Highest education level: Not on file  Occupational History  . Occupation: disabled  Tobacco Use  . Smoking status: Former Smoker    Packs/day: 0.50    Years: 35.00    Pack years: 17.50    Types: Cigarettes    Quit date: 08/16/2019    Years since quitting: 1.1  . Smokeless tobacco: Never Used  Vaping Use  . Vaping Use: Never used  Substance and Sexual Activity  . Alcohol use: Yes    Alcohol/week: 1.0 standard drink    Types: 1 Glasses of wine per week    Comment: rarely  . Drug use: No  . Sexual activity: Not Currently    Birth control/protection: Surgical  Other Topics Concern  . Not on file  Social History Narrative  Currently unemployed.   No children, not married   Social Determinants of Radio broadcast assistant Strain:   . Difficulty of Paying Living Expenses: Not on file  Food Insecurity:   . Worried About Charity fundraiser in the Last Year: Not on file  . Ran Out of Food in the Last Year: Not on file  Transportation Needs:   . Lack of Transportation (Medical): Not on file  . Lack of Transportation (Non-Medical): Not on file  Physical Activity:   . Days of Exercise per Week: Not on file  . Minutes of Exercise per Session: Not on file  Stress:   . Feeling of Stress : Not on file  Social Connections:   . Frequency of  Communication with Friends and Family: Not on file  . Frequency of Social Gatherings with Friends and Family: Not on file  . Attends Religious Services: Not on file  . Active Member of Clubs or Organizations: Not on file  . Attends Archivist Meetings: Not on file  . Marital Status: Not on file  Intimate Partner Violence:   . Fear of Current or Ex-Partner: Not on file  . Emotionally Abused: Not on file  . Physically Abused: Not on file  . Sexually Abused: Not on file    FH:  Family History  Problem Relation Age of Onset  . Colon cancer Sister 2  . Breast cancer Mother 73  . Alzheimer's disease Mother   . Diabetes Sister 5  . Heart failure Sister   . Diabetes Sister 74  . Lung cancer Father   . Breast cancer Sister   . Cancer Sister        mouth  . Anesthesia problems Neg Hx   . Hypotension Neg Hx   . Malignant hyperthermia Neg Hx   . Pseudochol deficiency Neg Hx     Past Medical History:  Diagnosis Date  . Anxiety    doesn't take any meds for this  . Arteriosclerosis of coronary artery 08/31/2011   Myoview 9/12: EF 44, no ischemia // Myoview 8/18: EF 57, low risk, possible small area of inferior ischemia // Minor nonobstructive CAD by cardiac catheterization - LHC 8/18: Proximal LAD 35, OM2 25  . Arthritis    knuckles  . Asthma   . Carotid artery disease ([place])    Carotid US 7/14: Bilateral ICA 40-59 // Carotid US ([facility]) 2/17: bilat plaque without sig stenosis  . Chronic diastolic CHF (congestive heart failure) ([place]) 07/26/2017   Echo 7/18:Severe conc LVH, EF 60-65, no RWMA, Gr 2 DD, MAC, mild MR // right and left heart cath 8/18: LVEDP 21  . Claustrophobia 07/26/2012  . COPD (chronic obstructive pulmonary disease) ([place])    "chronic bronchitis - about every winter"  . CSF leak    dizziness/headache - assoc symptoms // 2/2 encephalocele s/p skull base reconstruction  . Diverticulosis   . Dyspnea   . History of CVA (cerebrovascular accident) 03/2004    denies residual 07/26/2012  . Hx of gout   . Hyperlipidemia    takes Pravastatin daily  . Hypertensive heart disease with chronic diastolic congestive heart failure ([place]) 07/26/2017  . Internal hemorrhoids   . Kidney stones   . LVH (left ventricular hypertrophy)    Echo 8/18: Severe conc LVH, EF 60-65, no RWMA, Gr 2 DD, MAC, mild MR  . OSA (obstructive sleep apnea) 07/26/2012   prior CPAP - stopped due to being primary caregiver for dying parent (stopped  in 2016)  . PSVT (paroxysmal supraventricular tachycardia) ([place])   . Pulmonary hypertension, unspecified ([place]) 08/14/2017   cath 07/2017 >>PASP 5m Hg, PA mean 39 mm Hg  . Type II diabetes mellitus ([place]     Current Outpatient Medications  Medication Sig Dispense Refill  . acetaminophen (TYLENOL) 325 MG tablet Take 2 tablets (650 mg total) by mouth every 6 (six) hours as needed for mild pain. 90 tablet 2  . albuterol (VENTOLIN HFA) 108 (90 Base) MCG/ACT inhaler TAKE 2 PUFFS BY MOUTH EVERY 6 HOURS AS NEEDED FOR WHEEZE OR SHORTNESS OF BREATH 18 g 1  . allopurinol (ZYLOPRIM) 100 MG tablet Take 100 mg by mouth daily as needed (gout).     .Marland Kitchenapixaban (ELIQUIS) 5 MG TABS tablet Take 1 tablet (5 mg total) by mouth 2 (two) times daily. 60 tablet 11  . Blood Glucose Monitoring Suppl (ONETOUCH VERIO) w/Device KIT USE TO CHECK BLOOD GLUCOSE 4 TIMES A DAY . DIAGNOSIS: TYPE 2 DIABETES E11.59 1 kit 0  . flecainide (TAMBOCOR) 50 MG tablet Take 1 tablet (50 mg total) by mouth 2 (two) times daily. 180 tablet 3  . FLUoxetine (PROZAC) 20 MG capsule Take 1 capsule (20 mg total) by mouth at bedtime. 90 capsule 3  . gabapentin (NEURONTIN) 400 MG capsule Take 1 capsule (400 mg total) by mouth at bedtime. 90 capsule 3  . glucose blood (ONETOUCH VERIO) test strip For E11.22.  Use to check glucose qid 100 strip 12  . hydrALAZINE (APRESOLINE) 50 MG tablet TAKE 1 TABLET BY MOUTH EVERY 8 HOURS. 270 tablet 3  . isosorbide mononitrate (IMDUR) 60 MG 24 hr tablet TAKE 1  TABLET (60 MG TOTAL) BY MOUTH DAILY FOR 30 DAYS. ** DO NOT CRUSH ** 90 tablet 0  . metolazone (ZAROXOLYN) 2.5 MG tablet Take one tablet ONCE a week on Wednesday ONLY. 12 tablet 3  . nitroGLYCERIN (NITROSTAT) 0.4 MG SL tablet Place 1 tablet (0.4 mg total) under the tongue every 5 (five) minutes as needed. 25 tablet 3  . omeprazole (PRILOSEC) 20 MG capsule Take 1 capsule (20 mg total) by mouth daily. 90 capsule 1  . potassium chloride (KLOR-CON) 10 MEQ tablet Take 2 tablets by mouth ONCE a week on Wednesday ONLY. 24 tablet 3  . rosuvastatin (CRESTOR) 40 MG tablet Take 1 tablet (40 mg total) by mouth at bedtime. 90 tablet 3  . topiramate (TOPAMAX) 25 MG tablet TAKE 1 TABLET BY MOUTH TWICE A DAY 180 tablet 1  . torsemide (DEMADEX) 20 MG tablet Take 2 tablets (40 mg total) by mouth daily. 60 tablet 6  . Verapamil HCl CR 300 MG CP24 TAKE 1 CAPSULE (300 MG TOTAL) BY MOUTH DAILY. PLEASE CALL TO SCHEDULE OVERDUE APPT WITH DR ROSS 90 capsule 1   No current facility-administered medications for this encounter.    Vitals:   10/23/20 1417  BP: 122/90  Pulse: 81  SpO2: 95%  Weight: 122.7 kg (270 lb 9.6 oz)   Wt Readings from Last 3 Encounters:  10/23/20 122.7 kg (270 lb 9.6 oz)  10/12/20 119.9 kg (264 lb 4.8 oz)  09/12/20 123.8 kg (273 lb)   PHYSICAL EXAM: General: Obese woman sitting in WC. No resp difficulty HEENT: normal Neck: supple. JVP to ear . Carotids 2+ bilat; no bruits. No lymphadenopathy or thryomegaly appreciated. Cor: PMI nondisplaced. Regular rate & rhythm. No rubs, gallops or murmurs. Lungs: decreased throguhout Abdomen: markedly obese soft, nontender, + distended. No hepatosplenomegaly. No bruits or masses. Good  bowel sounds. Extremities: no cyanosis, clubbing, rash, 3+ tight edema into thighs Neuro: alert & orientedx3, cranial nerves grossly intact. moves all 4 extremities w/o difficulty. Affect pleasant   ASSESSMENT & PLAN:  1. Acute on Chronic biventricular HF due to  NICM - Echo 9/20 EF 25-30% (new). RV severely HK  - Cath 8/20 with minimal CAD - Echo 1/21  EF 45-50% with mod RV dysfunction. Grade 2 DD. MV is thickened with mild MR/MS - RHC 7/21 with mixed moderate pulmonary HTN - She is massively fluid overloaded today in setting of dietary noncomplaince.  - NYHA IIIb-IV confounded by obesity and COPD - No longer on Entresto or spiro due to recent AKI  - Continue Jardiance - Continue hydralazine 50 mg three times a day + 60 mg imdur.  - Has not been on b-blocker with wheezing - Will enroll with [facility] for daily IV lasix. +/- metolazone. Suspect she will need 7-10 days of IV diuresis.   2. Chronic hypoxic respiratory failure - Follows with Dr. Valeta Harms in Pulmonary - now on supp O2 - needs to improve compliance w/ CPAP     3. PAH, mild to moderate - likely who GROUP II & III  -  RHC 7/21 with mixed moderate pulmonary HTN - not candidate for selective pulmonary vasodilators - needs diuresis as above - Will arrange for RHC w/ Dr. Haroldine [name]  - Once better diuresed, would benefit from repeat PFTs and V/Q scan   4. CKD Stage III - baseline creatinine 1.4-1.7 - Entresto has been discontinued  - repeat labs today and follow closely with diuresis  5. COPD - Quit smoking since 08/16/19.  - followed by pulmonology   6. Morbid obesity Body mass index is 49.49 kg/m. - reinforced severity of her obesity and critical need to lose 30-40 pounds with low carb diet.   7. OSA - followed by Dr. Radford Pax - need to be more complaint with CPAP  8. DM2 - on SGLT2i  9. Palpitations=> SVT - Zio patch 1/21 showed frequent SVT - Referred to EP. Dr. Lovena [name]. Underwent EP study and AF was induced. Now on Flecainide - Encouraged to improve compliance w/ CPAP   Total time spent 45 minutes. Over half that time spent discussing above.    Glori Bickers MD 2:44 PM

## 2020-10-23 NOTE — Addendum Note (Signed)
Encounter addended by: Scarlette Calico, RN on: 10/23/2020 3:37 PM  Actions taken: Clinical Note Signed

## 2020-10-23 NOTE — Telephone Encounter (Signed)
Kelly calling from [facility] to get  verbal orders from PCP for pt to see a nurse   2 times a week for 5 weeks.  Please advise.  CB#[phone]

## 2020-10-23 NOTE — Addendum Note (Signed)
Encounter addended by: Shonna Chock, CMA on: 38/33/3832 3:43 PM  Actions taken: Clinical Note Signed, Flowsheet accepted

## 2020-10-23 NOTE — Progress Notes (Signed)
DUPLICATE, see other note dated 10/23/20

## 2020-10-24 ENCOUNTER — Telehealth: Payer: Self-pay | Admitting: Nurse Practitioner

## 2020-10-24 DIAGNOSIS — I5042 Chronic combined systolic (congestive) and diastolic (congestive) heart failure: Secondary | ICD-10-CM | POA: Diagnosis not present

## 2020-10-24 NOTE — Telephone Encounter (Signed)
ok 

## 2020-10-24 NOTE — Telephone Encounter (Signed)
[place] Name: [facility] Agency Name: [place] [facility] Phone #: [phone] Service Requested: PT Frequency of Visits: 2 week 3, 1 week 1   Please call with verbal order.

## 2020-10-25 DIAGNOSIS — E1122 Type 2 diabetes mellitus with diabetic chronic kidney disease: Secondary | ICD-10-CM | POA: Diagnosis not present

## 2020-10-25 DIAGNOSIS — I5042 Chronic combined systolic (congestive) and diastolic (congestive) heart failure: Secondary | ICD-10-CM | POA: Diagnosis not present

## 2020-10-25 NOTE — Telephone Encounter (Signed)
Verbal order given on secure voicemail for [name].

## 2020-10-26 ENCOUNTER — Telehealth: Payer: Self-pay

## 2020-10-26 DIAGNOSIS — I5042 Chronic combined systolic (congestive) and diastolic (congestive) heart failure: Secondary | ICD-10-CM | POA: Diagnosis not present

## 2020-10-26 NOTE — Telephone Encounter (Signed)
ok 

## 2020-10-26 NOTE — Telephone Encounter (Signed)
Radovan, OT, calling from [facility] requesting verbal orders for pt to have OT, Once a week for 6 weeks.  Please advise.  CB# [phone]

## 2020-10-29 ENCOUNTER — Encounter (HOSPITAL_BASED_OUTPATIENT_CLINIC_OR_DEPARTMENT_OTHER): Payer: Medicare Other | Admitting: Pulmonary Disease

## 2020-10-29 DIAGNOSIS — I5042 Chronic combined systolic (congestive) and diastolic (congestive) heart failure: Secondary | ICD-10-CM | POA: Diagnosis not present

## 2020-10-29 NOTE — Telephone Encounter (Signed)
Left voicemail confirming OT.

## 2020-10-30 ENCOUNTER — Encounter (HOSPITAL_COMMUNITY): Payer: Self-pay | Admitting: Emergency Medicine

## 2020-10-30 ENCOUNTER — Emergency Department (HOSPITAL_COMMUNITY): Payer: Medicare Other

## 2020-10-30 ENCOUNTER — Inpatient Hospital Stay (HOSPITAL_COMMUNITY)
Admission: EM | Admit: 2020-10-30 | Discharge: 2020-11-07 | DRG: 981 | Disposition: E | Payer: Medicare Other | Attending: Internal Medicine | Admitting: Internal Medicine

## 2020-10-30 ENCOUNTER — Inpatient Hospital Stay (HOSPITAL_COMMUNITY): Payer: Medicare Other

## 2020-10-30 ENCOUNTER — Telehealth (HOSPITAL_COMMUNITY): Payer: Self-pay | Admitting: *Deleted

## 2020-10-30 DIAGNOSIS — T17890A Other foreign object in other parts of respiratory tract causing asphyxiation, initial encounter: Secondary | ICD-10-CM | POA: Diagnosis not present

## 2020-10-30 DIAGNOSIS — N390 Urinary tract infection, site not specified: Secondary | ICD-10-CM | POA: Diagnosis present

## 2020-10-30 DIAGNOSIS — I5042 Chronic combined systolic (congestive) and diastolic (congestive) heart failure: Secondary | ICD-10-CM | POA: Diagnosis not present

## 2020-10-30 DIAGNOSIS — Z9981 Dependence on supplemental oxygen: Secondary | ICD-10-CM

## 2020-10-30 DIAGNOSIS — Z794 Long term (current) use of insulin: Secondary | ICD-10-CM

## 2020-10-30 DIAGNOSIS — I5043 Acute on chronic combined systolic (congestive) and diastolic (congestive) heart failure: Secondary | ICD-10-CM | POA: Diagnosis present

## 2020-10-30 DIAGNOSIS — I272 Pulmonary hypertension, unspecified: Secondary | ICD-10-CM | POA: Diagnosis not present

## 2020-10-30 DIAGNOSIS — G928 Other toxic encephalopathy: Secondary | ICD-10-CM | POA: Diagnosis not present

## 2020-10-30 DIAGNOSIS — R627 Adult failure to thrive: Secondary | ICD-10-CM | POA: Diagnosis not present

## 2020-10-30 DIAGNOSIS — Z9111 Patient's noncompliance with dietary regimen: Secondary | ICD-10-CM

## 2020-10-30 DIAGNOSIS — Z6841 Body Mass Index (BMI) 40.0 and over, adult: Secondary | ICD-10-CM

## 2020-10-30 DIAGNOSIS — E785 Hyperlipidemia, unspecified: Secondary | ICD-10-CM | POA: Diagnosis present

## 2020-10-30 DIAGNOSIS — G4089 Other seizures: Secondary | ICD-10-CM | POA: Diagnosis not present

## 2020-10-30 DIAGNOSIS — Z8249 Family history of ischemic heart disease and other diseases of the circulatory system: Secondary | ICD-10-CM

## 2020-10-30 DIAGNOSIS — J9621 Acute and chronic respiratory failure with hypoxia: Secondary | ICD-10-CM | POA: Diagnosis not present

## 2020-10-30 DIAGNOSIS — Z801 Family history of malignant neoplasm of trachea, bronchus and lung: Secondary | ICD-10-CM

## 2020-10-30 DIAGNOSIS — T426X5A Adverse effect of other antiepileptic and sedative-hypnotic drugs, initial encounter: Secondary | ICD-10-CM | POA: Diagnosis not present

## 2020-10-30 DIAGNOSIS — N1832 Chronic kidney disease, stage 3b: Secondary | ICD-10-CM

## 2020-10-30 DIAGNOSIS — I471 Supraventricular tachycardia: Secondary | ICD-10-CM | POA: Diagnosis not present

## 2020-10-30 DIAGNOSIS — E782 Mixed hyperlipidemia: Secondary | ICD-10-CM | POA: Diagnosis present

## 2020-10-30 DIAGNOSIS — Z7901 Long term (current) use of anticoagulants: Secondary | ICD-10-CM

## 2020-10-30 DIAGNOSIS — Z8673 Personal history of transient ischemic attack (TIA), and cerebral infarction without residual deficits: Secondary | ICD-10-CM

## 2020-10-30 DIAGNOSIS — Z9119 Patient's noncompliance with other medical treatment and regimen: Secondary | ICD-10-CM

## 2020-10-30 DIAGNOSIS — I2721 Secondary pulmonary arterial hypertension: Secondary | ICD-10-CM | POA: Diagnosis present

## 2020-10-30 DIAGNOSIS — J90 Pleural effusion, not elsewhere classified: Secondary | ICD-10-CM | POA: Diagnosis not present

## 2020-10-30 DIAGNOSIS — I130 Hypertensive heart and chronic kidney disease with heart failure and stage 1 through stage 4 chronic kidney disease, or unspecified chronic kidney disease: Secondary | ICD-10-CM | POA: Diagnosis present

## 2020-10-30 DIAGNOSIS — E662 Morbid (severe) obesity with alveolar hypoventilation: Secondary | ICD-10-CM | POA: Diagnosis present

## 2020-10-30 DIAGNOSIS — Z66 Do not resuscitate: Secondary | ICD-10-CM | POA: Diagnosis not present

## 2020-10-30 DIAGNOSIS — I2510 Atherosclerotic heart disease of native coronary artery without angina pectoris: Secondary | ICD-10-CM | POA: Diagnosis present

## 2020-10-30 DIAGNOSIS — M109 Gout, unspecified: Secondary | ICD-10-CM | POA: Diagnosis present

## 2020-10-30 DIAGNOSIS — R609 Edema, unspecified: Secondary | ICD-10-CM | POA: Diagnosis not present

## 2020-10-30 DIAGNOSIS — Z833 Family history of diabetes mellitus: Secondary | ICD-10-CM

## 2020-10-30 DIAGNOSIS — Z820 Family history of epilepsy and other diseases of the nervous system: Secondary | ICD-10-CM

## 2020-10-30 DIAGNOSIS — Z01818 Encounter for other preprocedural examination: Secondary | ICD-10-CM

## 2020-10-30 DIAGNOSIS — R092 Respiratory arrest: Secondary | ICD-10-CM

## 2020-10-30 DIAGNOSIS — I428 Other cardiomyopathies: Secondary | ICD-10-CM | POA: Diagnosis present

## 2020-10-30 DIAGNOSIS — G4733 Obstructive sleep apnea (adult) (pediatric): Secondary | ICD-10-CM | POA: Diagnosis not present

## 2020-10-30 DIAGNOSIS — I509 Heart failure, unspecified: Secondary | ICD-10-CM | POA: Diagnosis not present

## 2020-10-30 DIAGNOSIS — J9601 Acute respiratory failure with hypoxia: Secondary | ICD-10-CM | POA: Diagnosis not present

## 2020-10-30 DIAGNOSIS — I517 Cardiomegaly: Secondary | ICD-10-CM | POA: Diagnosis not present

## 2020-10-30 DIAGNOSIS — Z0189 Encounter for other specified special examinations: Secondary | ICD-10-CM

## 2020-10-30 DIAGNOSIS — E1122 Type 2 diabetes mellitus with diabetic chronic kidney disease: Secondary | ICD-10-CM | POA: Diagnosis not present

## 2020-10-30 DIAGNOSIS — Z79899 Other long term (current) drug therapy: Secondary | ICD-10-CM

## 2020-10-30 DIAGNOSIS — Z87891 Personal history of nicotine dependence: Secondary | ICD-10-CM

## 2020-10-30 DIAGNOSIS — J449 Chronic obstructive pulmonary disease, unspecified: Secondary | ICD-10-CM | POA: Diagnosis present

## 2020-10-30 DIAGNOSIS — G43909 Migraine, unspecified, not intractable, without status migrainosus: Secondary | ICD-10-CM | POA: Diagnosis present

## 2020-10-30 DIAGNOSIS — J9611 Chronic respiratory failure with hypoxia: Secondary | ICD-10-CM | POA: Diagnosis not present

## 2020-10-30 DIAGNOSIS — J9622 Acute and chronic respiratory failure with hypercapnia: Secondary | ICD-10-CM | POA: Diagnosis not present

## 2020-10-30 DIAGNOSIS — R918 Other nonspecific abnormal finding of lung field: Secondary | ICD-10-CM | POA: Diagnosis not present

## 2020-10-30 DIAGNOSIS — Z9289 Personal history of other medical treatment: Secondary | ICD-10-CM | POA: Diagnosis not present

## 2020-10-30 DIAGNOSIS — N184 Chronic kidney disease, stage 4 (severe): Secondary | ICD-10-CM | POA: Diagnosis present

## 2020-10-30 DIAGNOSIS — M19041 Primary osteoarthritis, right hand: Secondary | ICD-10-CM | POA: Diagnosis present

## 2020-10-30 DIAGNOSIS — Z20822 Contact with and (suspected) exposure to covid-19: Secondary | ICD-10-CM | POA: Diagnosis present

## 2020-10-30 DIAGNOSIS — J984 Other disorders of lung: Secondary | ICD-10-CM | POA: Diagnosis not present

## 2020-10-30 DIAGNOSIS — K219 Gastro-esophageal reflux disease without esophagitis: Secondary | ICD-10-CM | POA: Diagnosis present

## 2020-10-30 DIAGNOSIS — Z888 Allergy status to other drugs, medicaments and biological substances status: Secondary | ICD-10-CM

## 2020-10-30 DIAGNOSIS — M19042 Primary osteoarthritis, left hand: Secondary | ICD-10-CM | POA: Diagnosis present

## 2020-10-30 DIAGNOSIS — E1142 Type 2 diabetes mellitus with diabetic polyneuropathy: Secondary | ICD-10-CM | POA: Diagnosis not present

## 2020-10-30 DIAGNOSIS — Z803 Family history of malignant neoplasm of breast: Secondary | ICD-10-CM

## 2020-10-30 DIAGNOSIS — Z781 Physical restraint status: Secondary | ICD-10-CM

## 2020-10-30 DIAGNOSIS — B9620 Unspecified Escherichia coli [E. coli] as the cause of diseases classified elsewhere: Secondary | ICD-10-CM | POA: Diagnosis present

## 2020-10-30 DIAGNOSIS — F419 Anxiety disorder, unspecified: Secondary | ICD-10-CM | POA: Diagnosis present

## 2020-10-30 DIAGNOSIS — Z800 Family history of malignant neoplasm of digestive organs: Secondary | ICD-10-CM

## 2020-10-30 DIAGNOSIS — N179 Acute kidney failure, unspecified: Secondary | ICD-10-CM

## 2020-10-30 DIAGNOSIS — I110 Hypertensive heart disease with heart failure: Secondary | ICD-10-CM | POA: Diagnosis not present

## 2020-10-30 DIAGNOSIS — Z885 Allergy status to narcotic agent status: Secondary | ICD-10-CM

## 2020-10-30 DIAGNOSIS — R34 Anuria and oliguria: Secondary | ICD-10-CM | POA: Diagnosis not present

## 2020-10-30 DIAGNOSIS — Z4682 Encounter for fitting and adjustment of non-vascular catheter: Secondary | ICD-10-CM | POA: Diagnosis not present

## 2020-10-30 DIAGNOSIS — Z515 Encounter for palliative care: Secondary | ICD-10-CM

## 2020-10-30 DIAGNOSIS — R4182 Altered mental status, unspecified: Secondary | ICD-10-CM | POA: Diagnosis not present

## 2020-10-30 DIAGNOSIS — R569 Unspecified convulsions: Secondary | ICD-10-CM | POA: Diagnosis not present

## 2020-10-30 DIAGNOSIS — I5082 Biventricular heart failure: Secondary | ICD-10-CM | POA: Diagnosis present

## 2020-10-30 DIAGNOSIS — I480 Paroxysmal atrial fibrillation: Secondary | ICD-10-CM | POA: Diagnosis present

## 2020-10-30 DIAGNOSIS — R140 Abdominal distension (gaseous): Secondary | ICD-10-CM | POA: Diagnosis not present

## 2020-10-30 DIAGNOSIS — N170 Acute kidney failure with tubular necrosis: Secondary | ICD-10-CM | POA: Diagnosis present

## 2020-10-30 DIAGNOSIS — R21 Rash and other nonspecific skin eruption: Secondary | ICD-10-CM | POA: Diagnosis not present

## 2020-10-30 LAB — CBC
HCT: 41.1 % (ref 36.0–46.0)
Hemoglobin: 11.9 g/dL — ABNORMAL LOW (ref 12.0–15.0)
MCH: 27 pg (ref 26.0–34.0)
MCHC: 29 g/dL — ABNORMAL LOW (ref 30.0–36.0)
MCV: 93.4 fL (ref 80.0–100.0)
Platelets: 209 10*3/uL (ref 150–400)
RBC: 4.4 MIL/uL (ref 3.87–5.11)
RDW: 16.9 % — ABNORMAL HIGH (ref 11.5–15.5)
WBC: 7.1 10*3/uL (ref 4.0–10.5)
nRBC: 0 % (ref 0.0–0.2)

## 2020-10-30 LAB — HEPATIC FUNCTION PANEL
ALT: 8 U/L (ref 0–44)
AST: 18 U/L (ref 15–41)
Albumin: 3.9 g/dL (ref 3.5–5.0)
Alkaline Phosphatase: 72 U/L (ref 38–126)
Bilirubin, Direct: 0.4 mg/dL — ABNORMAL HIGH (ref 0.0–0.2)
Indirect Bilirubin: 1 mg/dL — ABNORMAL HIGH (ref 0.3–0.9)
Total Bilirubin: 1.4 mg/dL — ABNORMAL HIGH (ref 0.3–1.2)
Total Protein: 7.2 g/dL (ref 6.5–8.1)

## 2020-10-30 LAB — URINALYSIS, ROUTINE W REFLEX MICROSCOPIC
Bilirubin Urine: NEGATIVE
Glucose, UA: NEGATIVE mg/dL
Ketones, ur: NEGATIVE mg/dL
Nitrite: NEGATIVE
Protein, ur: 100 mg/dL — AB
Specific Gravity, Urine: 1.013 (ref 1.005–1.030)
WBC, UA: >50 WBC/hpf — ABNORMAL HIGH (ref 0–5)
pH: 5 (ref 5.0–8.0)

## 2020-10-30 LAB — BASIC METABOLIC PANEL
Anion gap: 15 (ref 5–15)
BUN: 53 mg/dL — ABNORMAL HIGH (ref 8–23)
CO2: 27 mmol/L (ref 22–32)
Calcium: 7.9 mg/dL — ABNORMAL LOW (ref 8.9–10.3)
Chloride: 95 mmol/L — ABNORMAL LOW (ref 98–111)
Creatinine, Ser: 4.87 mg/dL — ABNORMAL HIGH (ref 0.44–1.00)
GFR, Estimated: 9 mL/min — ABNORMAL LOW (ref >60)
Glucose, Bld: 121 mg/dL — ABNORMAL HIGH (ref 70–99)
Potassium: 4.3 mmol/L (ref 3.5–5.1)
Sodium: 137 mmol/L (ref 135–145)

## 2020-10-30 LAB — BRAIN NATRIURETIC PEPTIDE: B Natriuretic Peptide: 468.3 pg/mL — ABNORMAL HIGH (ref 0.0–100.0)

## 2020-10-30 LAB — COOXEMETRY PANEL
Carboxyhemoglobin: 1 % (ref 0.5–1.5)
Methemoglobin: 0.8 % (ref 0.0–1.5)
O2 Saturation: 52.2 %
Total hemoglobin: 12.3 g/dL (ref 12.0–16.0)

## 2020-10-30 LAB — GLUCOSE, CAPILLARY: Glucose-Capillary: 154 mg/dL — ABNORMAL HIGH (ref 70–99)

## 2020-10-30 LAB — RESPIRATORY PANEL BY RT PCR (FLU A&B, COVID)
Influenza A by PCR: NEGATIVE
Influenza B by PCR: NEGATIVE
SARS Coronavirus 2 by RT PCR: NEGATIVE

## 2020-10-30 LAB — HIV ANTIBODY (ROUTINE TESTING W REFLEX): HIV Screen 4th Generation wRfx: NONREACTIVE

## 2020-10-30 MED ORDER — TOPIRAMATE 25 MG PO TABS
25.0000 mg | ORAL_TABLET | Freq: Two times a day (BID) | ORAL | Status: DC
  Administered 2020-10-30: 25 mg via ORAL
  Filled 2020-10-30 (×2): qty 1

## 2020-10-30 MED ORDER — ACETAMINOPHEN 325 MG PO TABS: 650.0000 mg | ORAL_TABLET | ORAL | Status: DC | PRN

## 2020-10-30 MED ORDER — PANTOPRAZOLE SODIUM 40 MG PO TBEC
40.0000 mg | DELAYED_RELEASE_TABLET | Freq: Every day | ORAL | Status: DC
  Administered 2020-10-31: 40 mg via ORAL
  Filled 2020-10-30: qty 1

## 2020-10-30 MED ORDER — ALBUTEROL SULFATE HFA 108 (90 BASE) MCG/ACT IN AERS
1.0000 | INHALATION_SPRAY | Freq: Four times a day (QID) | RESPIRATORY_TRACT | Status: DC
  Filled 2020-10-30: qty 6.7

## 2020-10-30 MED ORDER — ACETAMINOPHEN 325 MG PO TABS: 650.0000 mg | ORAL_TABLET | Freq: Four times a day (QID) | ORAL | Status: DC | PRN

## 2020-10-30 MED ORDER — ISOSORBIDE MONONITRATE ER 30 MG PO TB24
30.0000 mg | ORAL_TABLET | Freq: Every day | ORAL | Status: DC
  Administered 2020-10-31: 30 mg via ORAL
  Filled 2020-10-30: qty 1

## 2020-10-30 MED ORDER — ALLOPURINOL 100 MG PO TABS
100.0000 mg | ORAL_TABLET | Freq: Every day | ORAL | Status: DC | PRN
  Filled 2020-10-30: qty 1

## 2020-10-30 MED ORDER — VERAPAMIL HCL ER 300 MG PO CP24: 300.0000 mg | ORAL_CAPSULE | Freq: Every day | ORAL | Status: DC

## 2020-10-30 MED ORDER — APIXABAN 2.5 MG PO TABS
2.5000 mg | ORAL_TABLET | Freq: Two times a day (BID) | ORAL | Status: DC
  Administered 2020-10-30: 2.5 mg via ORAL
  Filled 2020-10-30 (×2): qty 1

## 2020-10-30 MED ORDER — HYDRALAZINE HCL 50 MG PO TABS
50.0000 mg | ORAL_TABLET | Freq: Three times a day (TID) | ORAL | Status: DC
  Administered 2020-10-30 – 2020-10-31 (×2): 50 mg via ORAL
  Filled 2020-10-30 (×2): qty 1

## 2020-10-30 MED ORDER — SODIUM CHLORIDE 0.9% FLUSH
3.0000 mL | Freq: Two times a day (BID) | INTRAVENOUS | Status: DC
  Administered 2020-10-30 – 2020-11-02 (×5): 3 mL via INTRAVENOUS

## 2020-10-30 MED ORDER — FLECAINIDE ACETATE 50 MG PO TABS: 50.0000 mg | ORAL_TABLET | Freq: Two times a day (BID) | ORAL | Status: DC

## 2020-10-30 MED ORDER — GABAPENTIN 300 MG PO CAPS
300.0000 mg | ORAL_CAPSULE | Freq: Every day | ORAL | Status: DC
  Administered 2020-10-30: 300 mg via ORAL
  Filled 2020-10-30: qty 1

## 2020-10-30 MED ORDER — MILRINONE LACTATE IN DEXTROSE 20-5 MG/100ML-% IV SOLN
0.1250 ug/kg/min | INTRAVENOUS | Status: DC
  Administered 2020-10-30 – 2020-11-02 (×4): 0.125 ug/kg/min via INTRAVENOUS
  Filled 2020-10-30 (×5): qty 100

## 2020-10-30 MED ORDER — SODIUM CHLORIDE 0.9 % IV SOLN: 250.0000 mL | INTRAVENOUS | Status: DC | PRN

## 2020-10-30 MED ORDER — ROSUVASTATIN CALCIUM 20 MG PO TABS
40.0000 mg | ORAL_TABLET | Freq: Every day | ORAL | Status: DC
  Administered 2020-10-30: 40 mg via ORAL
  Filled 2020-10-30: qty 2

## 2020-10-30 MED ORDER — ONDANSETRON HCL 4 MG/2ML IJ SOLN
4.0000 mg | Freq: Four times a day (QID) | INTRAMUSCULAR | Status: DC | PRN
  Administered 2020-10-31: 4 mg via INTRAVENOUS
  Filled 2020-10-30: qty 2

## 2020-10-30 MED ORDER — FLUOXETINE HCL 20 MG PO CAPS
20.0000 mg | ORAL_CAPSULE | Freq: Every day | ORAL | Status: DC
  Administered 2020-10-30: 20 mg via ORAL
  Filled 2020-10-30: qty 1

## 2020-10-30 MED ORDER — SODIUM CHLORIDE 0.9% FLUSH: 3.0000 mL | INTRAVENOUS | Status: DC | PRN

## 2020-10-30 NOTE — ED Provider Notes (Signed)
[facility] Provider Note   CSN: [id number] Arrival date & time: 11/05/2020  1559     History Chief Complaint  Patient presents with  . Acute Renal Failure    Hargun Spurling is a 64 y.o. female.  Patient is a 63 year old female with past medical history of congestive heart failure, paroxysmal A. fib, COPD, chronic renal insufficiency.  She is sent by her primary doctor for evaluation of acute renal failure.  She had laboratory work performed at the [facility] office yesterday and creatinine was found to be 3.9.  It was repeated here and is 4.8.  Patient denies any specific complaints such as abdominal pain, fever, decreased urine output, back pain.  She does have some baseline shortness of breath related to her underlying medical conditions, but tells me that this is no worse than normal.  The history is provided by the patient.       Past Medical History:  Diagnosis Date  . Anxiety    doesn't take any meds for this  . Arteriosclerosis of coronary artery 08/31/2011   Myoview 9/12: EF 44, no ischemia // Myoview 8/18: EF 57, low risk, possible small area of inferior ischemia // Minor nonobstructive CAD by cardiac catheterization - [facility] 8/18: Proximal LAD 35, OM2 25  . Arthritis    knuckles  . Asthma   . Carotid artery disease ([place])    Carotid US 7/14: Bilateral ICA 40-59 // Carotid US ([facility]) 2/17: bilat plaque without sig stenosis  . Chronic diastolic CHF (congestive heart failure) ([place]) 07/26/2017   Echo 7/18:Severe conc LVH, EF 60-65, no RWMA, Gr 2 DD, MAC, mild MR // right and left heart cath 8/18: LVEDP 21  . Claustrophobia 07/26/2012  . COPD (chronic obstructive pulmonary disease) ([place])    "chronic bronchitis - about every winter"  . CSF leak    dizziness/headache - assoc symptoms // 2/2 encephalocele s/p skull base reconstruction  . Diverticulosis   . Dyspnea   . History of CVA (cerebrovascular accident) 03/2004   denies  residual 07/26/2012  . Hx of gout   . Hyperlipidemia    takes Pravastatin daily  . Hypertensive heart disease with chronic diastolic congestive heart failure ([place]) 07/26/2017  . Internal hemorrhoids   . Kidney stones   . LVH (left ventricular hypertrophy)    Echo 8/18: Severe conc LVH, EF 60-65, no RWMA, Gr 2 DD, MAC, mild MR  . OSA (obstructive sleep apnea) 07/26/2012   prior CPAP - stopped due to being primary caregiver for dying parent (stopped in 2016)  . PSVT (paroxysmal supraventricular tachycardia) ([place])   . Pulmonary hypertension, unspecified ([place]) 08/14/2017   cath 07/2017 >>PASP 94m Hg, PA mean 39 mm Hg  . Type II diabetes mellitus ([facility]     Patient Active Problem List   Diagnosis Date Noted  . Paroxysmal atrial fibrillation ([facility] 08/08/2020  . Acute on chronic diastolic CHF (congestive heart failure) ([facility] 04/17/2020  . Heart failure ([facility] 04/17/2020  . Acute on chronic combined systolic and diastolic CHF (congestive heart failure) ([facility] 04/16/2020  . CKD (chronic kidney disease) stage 4, GFR 15-29 ml/min (H[facility]) 04/16/2020  . GERD without esophagitis 04/16/2020  . Biventricular heart failure ([facility] 09/07/2019  . Acute gout 09/07/2019  . Migraine syndrome 07/05/2019  . Edema 05/23/2019  . COPD (chronic obstructive pulmonary disease) ([facility] 01/05/2019  . Type 2 diabetes mellitus with stage 3 chronic kidney disease, with long-term current use of insulin ([facility] 11/18/2018  . Screening  for breast cancer 06/30/2018  . Nicotine dependence, cigarettes, in remission 03/31/2018  . Pulmonary hypertension, unspecified ([place]) 08/14/2017  . Hypertensive heart disease with chronic diastolic congestive heart failure ([place]) 07/26/2017  . Paroxysmal SVT (supraventricular tachycardia) ([place]) 07/26/2017  . COPD  GOLD 0 still smoking 07/13/2017  . Urinary urgency 02/04/2017  . Moderate episode of recurrent major depressive disorder ([place]) 07/12/2015  . OSA (obstructive sleep apnea) 10/17/2014  .  Bilateral knee pain 09/28/2014  . Carotid artery disease ([place]) 06/03/2013  . Uric acid kidney stone 07/04/2012  . CSF rhinorrhea 10/19/2011  . Arteriosclerosis of coronary artery 08/31/2011  . Type 2 diabetes mellitus with diabetic polyneuropathy ([place]) 08/31/2011  . Mixed hyperlipidemia 07/02/2011  . Essential hypertension 06/20/2011  . Morbid obesity due to excess calories ([place]) 06/20/2011    Past Surgical History:  Procedure Laterality Date  . ESOPHAGOGASTRODUODENOSCOPY (EGD) WITH PROPOFOL N/A 07/13/2015   Procedure: ESOPHAGOGASTRODUODENOSCOPY (EGD) WITH PROPOFOL;  Surgeon: Carol [facility], MD;  Location: [facility];  Service: Endoscopy;  Laterality: N/A;  . NASAL SINUS SURGERY  01/29/2012   Procedure: ENDOSCOPIC SINUS SURGERY WITH STEALTH;  Surgeon: Ruby Cola, MD;  Location: [facility];  Service: ENT;  Laterality: N/A;  . NM MYOVIEW LTD  08/25/2011   Low risk study, no evidence of ischemia, EF 44%  . REFRACTIVE SURGERY  ~ 2010   left  . RIGHT HEART CATH N/A 06/29/2020   Procedure: RIGHT HEART CATH;  Surgeon: Jolaine Artist, MD;  Location: [facility];  Service: Cardiovascular;  Laterality: N/A;  . RIGHT/LEFT HEART CATH AND CORONARY ANGIOGRAPHY N/A 07/30/2017   Procedure: RIGHT/LEFT HEART CATH AND CORONARY ANGIOGRAPHY;  Surgeon: Martinique, Peter M, MD;  Location: [facility];  Service: Cardiovascular;  Laterality: N/A;  . SPHENOIDECTOMY  01/29/2012   Procedure: SPHENOIDECTOMY;  Surgeon: Ruby Cola, MD;  Location: [facility];  Service: ENT;  Laterality: Left;  REPAIR OF LEFT SPHENOID    . SVT ABLATION N/A 05/21/2020   Procedure: SVT ABLATION;  Surgeon: Evans Lance, MD;  Location: [facility];  Service: Cardiovascular;  Laterality: N/A;  . TOTAL ABDOMINAL HYSTERECTOMY  1994     OB History    Gravida  0   Para      Term      Preterm      AB      Living        SAB      TAB      Ectopic      Multiple      Live Births              Family  History  Problem Relation Age of Onset  . Colon cancer Sister 28  . Breast cancer Mother 53  . Alzheimer's disease Mother   . Diabetes Sister 24  . Heart failure Sister   . Diabetes Sister 78  . Lung cancer Father   . Breast cancer Sister   . Cancer Sister        mouth  . Anesthesia problems Neg Hx   . Hypotension Neg Hx   . Malignant hyperthermia Neg Hx   . Pseudochol deficiency Neg Hx     Social History   Tobacco Use  . Smoking status: Former Smoker    Packs/day: 0.50    Years: 35.00    Pack years: 17.50    Types: Cigarettes    Quit date: 08/16/2019    Years since quitting: 1.2  . Smokeless  tobacco: Never Used  Vaping Use  . Vaping Use: Never used  Substance Use Topics  . Alcohol use: Yes    Alcohol/week: 1.0 standard drink    Types: 1 Glasses of wine per week    Comment: rarely  . Drug use: No    Home Medications Prior to Admission medications   Medication Sig Start Date End Date Taking? Authorizing Provider  acetaminophen (TYLENOL) 325 MG tablet Take 2 tablets (650 mg total) by mouth every 6 (six) hours as needed for mild pain. 06/30/18   Luetta Nutting, DO  albuterol (VENTOLIN HFA) 108 (90 Base) MCG/ACT inhaler TAKE 2 PUFFS BY MOUTH EVERY 6 HOURS AS NEEDED FOR WHEEZE OR SHORTNESS OF BREATH 12/19/19   Luetta Nutting, DO  allopurinol (ZYLOPRIM) 100 MG tablet Take 100 mg by mouth daily as needed (gout).  06/24/18   [provider]  apixaban (ELIQUIS) 5 MG TABS tablet Take 1 tablet (5 mg total) by mouth 2 (two) times daily. 06/20/20   Evans Lance, MD  Blood Glucose Monitoring Suppl (ONETOUCH VERIO) w/Device KIT USE TO CHECK BLOOD GLUCOSE 4 TIMES A DAY . DIAGNOSIS: TYPE 2 DIABETES E11.59 11/09/19   Luetta Nutting, DO  flecainide (TAMBOCOR) 50 MG tablet Take 1 tablet (50 mg total) by mouth 2 (two) times daily. 06/20/20   Evans Lance, MD  FLUoxetine (PROZAC) 20 MG capsule Take 1 capsule (20 mg total) by mouth at bedtime. 04/13/20   Nche, Charlene Brooke, NP    gabapentin (NEURONTIN) 400 MG capsule Take 1 capsule (400 mg total) by mouth at bedtime. 04/24/20   Nche, Charlene Brooke, NP  glucose blood (ONETOUCH VERIO) test strip For E11.22.  Use to check glucose qid 11/09/19   Luetta Nutting, DO  hydrALAZINE (APRESOLINE) 50 MG tablet TAKE 1 TABLET BY MOUTH EVERY 8 HOURS. 10/08/20   Evans Lance, MD  isosorbide mononitrate (IMDUR) 60 MG 24 hr tablet TAKE 1 TABLET (60 MG TOTAL) BY MOUTH DAILY FOR 30 DAYS. ** DO NOT CRUSH ** 06/12/20   Nche, Charlene Brooke, NP  metolazone (ZAROXOLYN) 2.5 MG tablet Take one tablet ONCE a week on Wednesday ONLY. 08/08/20   Evans Lance, MD  nitroGLYCERIN (NITROSTAT) 0.4 MG SL tablet Place 1 tablet (0.4 mg total) under the tongue every 5 (five) minutes as needed. 07/27/17   Richardson Dopp T, PA-C  omeprazole (PRILOSEC) 20 MG capsule Take 1 capsule (20 mg total) by mouth daily. 04/24/20   Nche, Charlene Brooke, NP  potassium chloride (KLOR-CON) 10 MEQ tablet Take 2 tablets by mouth ONCE a week on Wednesday ONLY. 08/08/20   Evans Lance, MD  rosuvastatin (CRESTOR) 40 MG tablet Take 1 tablet (40 mg total) by mouth at bedtime. 10/16/20   Nche, Charlene Brooke, NP  topiramate (TOPAMAX) 25 MG tablet TAKE 1 TABLET BY MOUTH TWICE A DAY 01/02/20   Luetta Nutting, DO  torsemide (DEMADEX) 20 MG tablet Take 2 tablets (40 mg total) by mouth daily. 05/03/20   Lyda Jester M, PA-C  Verapamil HCl CR 300 MG CP24 TAKE 1 CAPSULE (300 MG TOTAL) BY MOUTH DAILY. PLEASE CALL TO SCHEDULE OVERDUE APPT WITH DR ROSS 04/04/20   Nche, Charlene Brooke, NP    Allergies    Atorvastatin, Ibuprofen, Metformin and related, Ace inhibitors, Hydrocodone-acetaminophen, and Albuterol  Review of Systems   Review of Systems  All other systems reviewed and are negative.   Physical Exam Updated Vital Signs BP 123/66   Pulse (!) 58   Temp  97.6 F (36.4 C) (Oral)   Resp (!) 22   Ht _0  (1.575 m)   Wt 118.8 kg   SpO2 100%   BMI 47.92 kg/m   Physical Exam Vitals  and nursing note reviewed.  Constitutional:      General: She is not in acute distress.    Appearance: She is well-developed. She is not diaphoretic.  HENT:     Head: Normocephalic and atraumatic.  Cardiovascular:     Rate and Rhythm: Normal rate and regular rhythm.     Heart sounds: No murmur heard.  No friction rub. No gallop.   Pulmonary:     Effort: Pulmonary effort is normal. No respiratory distress.     Breath sounds: Rales present. No wheezing.  Abdominal:     General: Bowel sounds are normal. There is no distension.     Palpations: Abdomen is soft.     Tenderness: There is no abdominal tenderness.  Musculoskeletal:        General: Normal range of motion.     Cervical back: Normal range of motion and neck supple.     Right lower leg: Edema present.     Left lower leg: Edema present.  Skin:    General: Skin is warm and dry.  Neurological:     Mental Status: She is alert and oriented to person, place, and time.     ED Results / Procedures / Treatments   Labs (all labs ordered are listed, but only abnormal results are displayed) Labs Reviewed  BASIC METABOLIC PANEL - Abnormal; Notable for the following components:      Result Value   Chloride 95 (*)    Glucose, Bld 121 (*)    BUN 53 (*)    Creatinine, Ser 4.87 (*)    Calcium 7.9 (*)    GFR, Estimated 9 (*)    All other components within normal limits  CBC - Abnormal; Notable for the following components:   Hemoglobin 11.9 (*)    MCHC 29.0 (*)    RDW 16.9 (*)    All other components within normal limits  URINALYSIS, ROUTINE W REFLEX MICROSCOPIC    EKG EKG Interpretation  Date/Time:  Tuesday October 30 2020 16:03:05 EST Ventricular Rate:  62 PR Interval:  278 QRS Duration: 112 QT Interval:  526 QTC Calculation: 533 R Axis:   -173 Text Interpretation: Sinus rhythm with 1st degree A-V block Low voltage QRS Possible Anterolateral infarct , age undetermined Prolonged QT Abnormal ECG No significant change  since 10/23/2020 Confirmed by Veryl Speak [phone]) on 10/19/2020 6:01:59 PM   Radiology No results found.  Procedures Procedures (including critical care time)  Medications Ordered in ED Medications - No data to display  ED Course  I have reviewed the triage vital signs and the nursing notes.  Pertinent labs & imaging results that were available during my care of the patient were reviewed by me and considered in my medical decision making (see chart for details).    MDM Rules/Calculators/A&P  Patient with extensive past medical history as described in the HPI sent for evaluation of worsening renal function.  Patient's creatinine is nearly 5 which is a significant bump from her baseline.  She also appears to be an heart failure.  Patient will be admitted to the hospitalist service for further management.  I have spoken with Dr. Roosevelt Locks who agrees to admit.  Final Clinical Impression(s) / ED Diagnoses Final diagnoses:  None    Rx / DC  Orders ED Discharge Orders    None       Veryl Speak, MD 10/31/20 1530

## 2020-10-30 NOTE — Progress Notes (Signed)
PCCM consulted for central line, pt restless and volume overloaded and having trouble laying flat, spoke with overnight cardiology physician and PCCM attending who felt that pt could be started on Milrinone peripherally tonight and have PICC placed in the AM for CVP monitoring.    Otilio Carpen Raegan Winders, PA-C

## 2020-10-30 NOTE — H&P (Signed)
History and Physical    [name] HYI:[id number] DOB: 1956-11-17 DOA: 11/06/2020  PCP: Flossie Buffy, NP (Confirm with patient/family/NH records and if not entered, this has to be entered at [facility] point of entry) Patient coming from: home  I have personally briefly reviewed patient's old medical records in [place]  Chief Complaint: My kidney is bad  HPI: [name] is a 64 y.o. female with medical history significant of chronic combined systolic and diastolic CHF, chronic hypoxic respite failure on 3 L continuously, CKD stage III, HTN, paroxysmal A. fib, presented with worsening of kidney function.  On 11/7, patient was started on IV Lasix for decompensated CHF.  Patient reported she has been responded well on the low day before yesterday her urine output has significantly decreased, that she urinated only 2 times the whole day the day before yesterday, and the urine color appeared to be very dark.  Yesterday the blood work showed her kidney function elevated with creatinine 3.8 compared to baseline 1.9-2.  IV Lasix was discontinued yesterday.  Home cannot check her kidney function again today showed creatinine more than 4, patient claimed that no urine output since this morning.  Patient also reported she started to have wheezing and increasing shortness of breath since last night. ED Course: Creatinine 4.8, potassium 4.3, bicarb 27.  Chest x-ray showed cardiomegaly and bilateral pulmonary congestion.  Review of Systems: As per HPI otherwise 14 point review of systems negative.    Past Medical History:  Diagnosis Date  . Anxiety    doesn't take any meds for this  . Arteriosclerosis of coronary artery 08/31/2011   Myoview 9/12: EF 44, no ischemia // Myoview 8/18: EF 57, low risk, possible small area of inferior ischemia // Minor nonobstructive CAD by cardiac catheterization - [facility] 8/18: Proximal LAD 35, OM2 25  . Arthritis    knuckles  . Asthma   .  Carotid artery disease ([place])    Carotid US 7/14: Bilateral ICA 40-59 // Carotid US ([facility]) 2/17: bilat plaque without sig stenosis  . Chronic diastolic CHF (congestive heart failure) ([place]) 07/26/2017   Echo 7/18:Severe conc LVH, EF 60-65, no RWMA, Gr 2 DD, MAC, mild MR // right and left heart cath 8/18: LVEDP 21  . Claustrophobia 07/26/2012  . COPD (chronic obstructive pulmonary disease) ([place])    "chronic bronchitis - about every winter"  . CSF leak    dizziness/headache - assoc symptoms // 2/2 encephalocele s/p skull base reconstruction  . Diverticulosis   . Dyspnea   . History of CVA (cerebrovascular accident) 03/2004   denies residual 07/26/2012  . Hx of gout   . Hyperlipidemia    takes Pravastatin daily  . Hypertensive heart disease with chronic diastolic congestive heart failure ([place]) 07/26/2017  . Internal hemorrhoids   . Kidney stones   . LVH (left ventricular hypertrophy)    Echo 8/18: Severe conc LVH, EF 60-65, no RWMA, Gr 2 DD, MAC, mild MR  . OSA (obstructive sleep apnea) 07/26/2012   prior CPAP - stopped due to being primary caregiver for dying parent (stopped in 2016)  . PSVT (paroxysmal supraventricular tachycardia) ([place])   . Pulmonary hypertension, unspecified ([place]) 08/14/2017   cath 07/2017 >>PASP 79m Hg, PA mean 39 mm Hg  . Type II diabetes mellitus ([place]     Past Surgical History:  Procedure Laterality Date  . ESOPHAGOGASTRODUODENOSCOPY (EGD) WITH PROPOFOL N/A 07/13/2015   Procedure: ESOPHAGOGASTRODUODENOSCOPY (EGD) WITH PROPOFOL;  Surgeon: PCarol Ada MD;  Location: [facility];  Service: Endoscopy;  Laterality: N/A;  . NASAL SINUS SURGERY  01/29/2012   Procedure: ENDOSCOPIC SINUS SURGERY WITH STEALTH;  Surgeon: Ruby Cola, MD;  Location: [facility];  Service: ENT;  Laterality: N/A;  . NM MYOVIEW LTD  08/25/2011   Low risk study, no evidence of ischemia, EF 44%  . REFRACTIVE SURGERY  ~ 2010   left  . RIGHT HEART CATH N/A 06/29/2020   Procedure: RIGHT HEART CATH;   Surgeon: Jolaine Artist, MD;  Location: [place] [facility];  Service: Cardiovascular;  Laterality: N/A;  . RIGHT/LEFT HEART CATH AND CORONARY ANGIOGRAPHY N/A 07/30/2017   Procedure: RIGHT/LEFT HEART CATH AND CORONARY ANGIOGRAPHY;  Surgeon: Martinique, Peter M, MD;  Location: [place] [facility];  Service: Cardiovascular;  Laterality: N/A;  . SPHENOIDECTOMY  01/29/2012   Procedure: SPHENOIDECTOMY;  Surgeon: Ruby Cola, MD;  Location: [place];  Service: ENT;  Laterality: Left;  REPAIR OF LEFT SPHENOID    . SVT ABLATION N/A 05/21/2020   Procedure: SVT ABLATION;  Surgeon: Evans Lance, MD;  Location: [place] [facility];  Service: Cardiovascular;  Laterality: N/A;  . TOTAL ABDOMINAL HYSTERECTOMY  1994     reports that she quit smoking about 14 months ago. Her smoking use included cigarettes. She has a 17.50 pack-year smoking history. She has never used smokeless tobacco. She reports current alcohol use of about 1.0 standard drink of alcohol per week. She reports that she does not use drugs.  Allergies  Allergen Reactions  . Atorvastatin Other (See Comments) and Nausea And Vomiting    "legs cramped; moderate to almost severe" "legs cramped; moderate to almost severe"  . Ibuprofen Other (See Comments)    Other reaction(s): Other (See Comments) Per pt, MD doesn't want pt to take Per pt, MD doesn't want pt to take  . Metformin And Related Nausea And Vomiting  . Ace Inhibitors Cough and Nausea And Vomiting  . Hydrocodone-Acetaminophen Nausea And Vomiting  . Albuterol Other (See Comments)    Caused SVT, but pt is currently taking it.     Family History  Problem Relation Age of Onset  . Colon cancer Sister 38  . Breast cancer Mother 72  . Alzheimer's disease Mother   . Diabetes Sister 71  . Heart failure Sister   . Diabetes Sister 31  . Lung cancer Father   . Breast cancer Sister   . Cancer Sister        mouth  . Anesthesia problems Neg Hx   . Hypotension Neg Hx   . Malignant  hyperthermia Neg Hx   . Pseudochol deficiency Neg Hx      Prior to Admission medications   Medication Sig Start Date End Date Taking? Authorizing Provider  acetaminophen (TYLENOL) 325 MG tablet Take 2 tablets (650 mg total) by mouth every 6 (six) hours as needed for mild pain. 06/30/18   Luetta Nutting, DO  albuterol (VENTOLIN HFA) 108 (90 Base) MCG/ACT inhaler TAKE 2 PUFFS BY MOUTH EVERY 6 HOURS AS NEEDED FOR WHEEZE OR SHORTNESS OF BREATH 12/19/19   Luetta Nutting, DO  allopurinol (ZYLOPRIM) 100 MG tablet Take 100 mg by mouth daily as needed (gout).  06/24/18   [provider]  apixaban (ELIQUIS) 5 MG TABS tablet Take 1 tablet (5 mg total) by mouth 2 (two) times daily. 06/20/20   Evans Lance, MD  Blood Glucose Monitoring Suppl (ONETOUCH VERIO) w/Device KIT USE TO CHECK BLOOD GLUCOSE 4 TIMES A DAY .  DIAGNOSIS: TYPE 2 DIABETES E11.59 11/09/19   Luetta Nutting, DO  flecainide (TAMBOCOR) 50 MG tablet Take 1 tablet (50 mg total) by mouth 2 (two) times daily. 06/20/20   Evans Lance, MD  FLUoxetine (PROZAC) 20 MG capsule Take 1 capsule (20 mg total) by mouth at bedtime. 04/13/20   Nche, Charlene Brooke, NP  gabapentin (NEURONTIN) 400 MG capsule Take 1 capsule (400 mg total) by mouth at bedtime. 04/24/20   Nche, Charlene Brooke, NP  glucose blood (ONETOUCH VERIO) test strip For E11.22.  Use to check glucose qid 11/09/19   Luetta Nutting, DO  hydrALAZINE (APRESOLINE) 50 MG tablet TAKE 1 TABLET BY MOUTH EVERY 8 HOURS. 10/08/20   Evans Lance, MD  isosorbide mononitrate (IMDUR) 60 MG 24 hr tablet TAKE 1 TABLET (60 MG TOTAL) BY MOUTH DAILY FOR 30 DAYS. ** DO NOT CRUSH ** 06/12/20   Nche, Charlene Brooke, NP  metolazone (ZAROXOLYN) 2.5 MG tablet Take one tablet ONCE a week on Wednesday ONLY. 08/08/20   Evans Lance, MD  nitroGLYCERIN (NITROSTAT) 0.4 MG SL tablet Place 1 tablet (0.4 mg total) under the tongue every 5 (five) minutes as needed. 07/27/17   Richardson Dopp T, PA-C  omeprazole (PRILOSEC) 20 MG  capsule Take 1 capsule (20 mg total) by mouth daily. 04/24/20   Nche, Charlene Brooke, NP  potassium chloride (KLOR-CON) 10 MEQ tablet Take 2 tablets by mouth ONCE a week on Wednesday ONLY. 08/08/20   Evans Lance, MD  rosuvastatin (CRESTOR) 40 MG tablet Take 1 tablet (40 mg total) by mouth at bedtime. 10/16/20   Nche, Charlene Brooke, NP  topiramate (TOPAMAX) 25 MG tablet TAKE 1 TABLET BY MOUTH TWICE A DAY 01/02/20   Luetta Nutting, DO  torsemide (DEMADEX) 20 MG tablet Take 2 tablets (40 mg total) by mouth daily. 05/03/20   Lyda Jester M, PA-C  Verapamil HCl CR 300 MG CP24 TAKE 1 CAPSULE (300 MG TOTAL) BY MOUTH DAILY. PLEASE CALL TO SCHEDULE OVERDUE APPT WITH DR ROSS 04/04/20   Flossie Buffy, NP    Physical Exam: Vitals:   10/21/2020 1600 10/28/2020 1603 10/23/2020 1715 10/08/2020 1800  BP:  131/71 123/66 128/61  Pulse:  63 (!) 58 63  Resp:  (!) 22 (!) 22 (!) 22  Temp:  97.6 F (36.4 C)    TempSrc:  Oral    SpO2: 97% 95% 100% 97%  Weight:  118.8 kg    Height:  _0  (1.575 m)      Constitutional: NAD, calm, comfortable Vitals:   10/31/2020 1600 10/13/2020 1603 10/28/2020 1715 10/28/2020 1800  BP:  131/71 123/66 128/61  Pulse:  63 (!) 58 63  Resp:  (!) 22 (!) 22 (!) 22  Temp:  97.6 F (36.4 C)    TempSrc:  Oral    SpO2: 97% 95% 100% 97%  Weight:  118.8 kg    Height:  _1  (1.575 m)     Eyes: PERRL, lids and conjunctivae normal ENMT: Mucous membranes are moist. Posterior pharynx clear of any exudate or lesions.Normal dentition.  Neck: normal, supple, no masses, no thyromegaly Respiratory: clear to auscultation bilaterally, fine crackles to the bilateral mid field, scattered wheezing.  Increasing respiratory effort. No accessory muscle use.  Cardiovascular: Regular rate and rhythm, no murmurs / rubs / gallops. No extremity edema. 2+ pedal pulses. No carotid bruits.  Abdomen: no tenderness, no masses palpated. No hepatosplenomegaly. Bowel sounds positive.  Musculoskeletal: no clubbing /  cyanosis. No joint deformity  upper and lower extremities. Good ROM, no contractures. Normal muscle tone.  Skin: no rashes, lesions, ulcers. No induration Neurologic: CN 2-12 grossly intact. Sensation intact, DTR normal. Strength 5/5 in all 4.  Psychiatric: Normal judgment and insight. Alert and oriented x 3. Normal mood.     Labs on Admission: I have personally reviewed following labs and imaging studies  CBC: Recent Labs  Lab 10/31/2020 1611  WBC 7.1  HGB 11.9*  HCT 41.1  MCV 93.4  PLT 209   Basic Metabolic Panel: Recent Labs  Lab 10/23/2020 1611  NA 137  K 4.3  CL 95*  CO2 27  GLUCOSE 121*  BUN 53*  CREATININE 4.87*  CALCIUM 7.9*   GFR: Estimated Creatinine Clearance: 14.5 mL/min (A) (by C-G formula based on SCr of 4.87 mg/dL (H)). Liver Function Tests: No results for input(s): AST, ALT, ALKPHOS, BILITOT, PROT, ALBUMIN in the last 168 hours. No results for input(s): LIPASE, AMYLASE in the last 168 hours. No results for input(s): AMMONIA in the last 168 hours. Coagulation Profile: No results for input(s): INR, PROTIME in the last 168 hours. Cardiac Enzymes: No results for input(s): CKTOTAL, CKMB, CKMBINDEX, TROPONINI in the last 168 hours. BNP (last 3 results) Recent Labs    12/14/19 1400  PROBNP 905.0*   HbA1C: No results for input(s): HGBA1C in the last 72 hours. CBG: No results for input(s): GLUCAP in the last 168 hours. Lipid Profile: No results for input(s): CHOL, HDL, LDLCALC, TRIG, CHOLHDL, LDLDIRECT in the last 72 hours. Thyroid Function Tests: No results for input(s): TSH, T4TOTAL, FREET4, T3FREE, THYROIDAB in the last 72 hours. Anemia Panel: No results for input(s): VITAMINB12, FOLATE, FERRITIN, TIBC, IRON, RETICCTPCT in the last 72 hours. Urine analysis:    Component Value Date/Time   COLORURINE AMBER (A) 08/18/2019 2150   APPEARANCEUR CLOUDY (A) 08/18/2019 2150   LABSPEC 1.017 08/18/2019 2150   PHURINE 5.0 08/18/2019 2150   GLUCOSEU NEGATIVE  08/18/2019 2150   HGBUR MODERATE (A) 08/18/2019 2150   BILIRUBINUR NEGATIVE 08/18/2019 2150   BILIRUBINUR 1+ 02/07/2019 1433   KETONESUR NEGATIVE 08/18/2019 2150   PROTEINUR >=300 (A) 08/18/2019 2150   UROBILINOGEN 0.2 02/07/2019 1433   UROBILINOGEN 0.2 07/07/2013 1353   NITRITE POSITIVE (A) 08/18/2019 2150   LEUKOCYTESUR LARGE (A) 08/18/2019 2150    Radiological Exams on Admission: No results found.  EKG: Independently reviewed.  Sinus, low voltage  Assessment/Plan Active Problems:   AKI (acute kidney injury) ([place])   CHF (congestive heart failure) ([place])  (please populate well all problems here in Problem List. (For example, if patient is on BP meds at home and you resume or decide to hold them, it is a problem that needs to be her. Same for CAD, COPD, HLD and so on)  Acute on chronic combined systolic and diastolic CHF decompensation -Discussed with on-call cardiology, who will see patient emergently to decide diuresis regimen -Discussed with on-call nephrologist, review patient image study, no indication for HD tonight.  Nephrology okay with diuresis tonight. -Continue other CHF medications. -Long-term prognosis poor, discussed with patient and her niece at bedside, patient wished to maintain FULL CODE STATUS  AKI -ATN versus cardiorenal syndrome, clinically patient has mild CHF decompensation given there is no significant increase of oxygen demands.  Elevation of creatinine appears to correlate with IV Lasix use over the last 7 days, cardiology will see the pt and help [place] to optimize her diuresis regimen. -Renal ultrasound and UA, and uric acid level -Continue allopurinol. -Foley  for accurate I&O's  Paroxysmal A. Fib -Rate controlled, continue Flecainide and verapamil -Renal dosed Eliquis  HTN -Hydralazine, Imdur  HLD -Crestor 40  DVT prophylaxis: Eliquis Code Status: Full code Family Communication: Niece at bedside Disposition Plan: Patient sick, expect more than 2  midnight hospital stay Consults called: Cardiology, curbside consult with nephrology who recommended reconsult daytime nephro tomorrow Admission status: PCU   Lequita Halt MD [facility] Pager [phone]  10/08/2020, 7:19 PM

## 2020-10-30 NOTE — Consult Note (Signed)
Cardiology Consultation:   Patient ID: [name]; [id number]; [birth year]   Admit date: 10/12/2020 Date of Consult: 10/29/2020  Primary Care Provider: Flossie Buffy, NP Primary Cardiologist: Dorris Carnes, MD 08/25/2011 Primary Electrophysiologist:  None  Advanced Heart Failure: Glori Bickers, MD  [name] is a 64 y.o. female who is being seen today for the evaluation of CHF at the request of Dr. Roosevelt Locks.   Patient Profile:   [name] is a 64 y.o. female with a hx of non-obs CAD by cath 2018, DM, HTN, HLD, non-obs carotid dz, COPD, LVH on echo, CVA, OSA ?CPAP, OA, asthma, obesity, CKD III w/ baselin Cr 1.4, who is being seen today for the evaluation of CHF and ARF at the request of Dr Stark Jock.  Rec'd 2nd COVID vaccine shot about 3 weeks ago.  History of Present Illness:   [name] was seen by Dr Haroldine Laws 11/16. She was on Jardiance, hydralazine, not on BB (wheezing), or Entresto (AKI 03/2020). Wt was 270 lbs. Pt volume overloaded, placed on IV Lasix by [facility].   [name] has been sleeping on the couch for over a year.   She was losing fluid, niece says she lost about 10 lbs, but had gained about 4 of them back. She has noticed LE edema, sleeping poorly.   In the last few days, she has had some episodes of being light-headed, the worst of these was yesterday. She did not lose consciousness, but had to sit down to keep from falling.       Past Medical History:  Diagnosis Date  . Anxiety    doesn't take any meds for this  . Arteriosclerosis of coronary artery 08/31/2011   Myoview 9/12: EF 44, no ischemia // Myoview 8/18: EF 57, low risk, possible small area of inferior ischemia // Minor nonobstructive CAD by cardiac catheterization - [facility] 8/18: Proximal LAD 35, OM2 25  . Arthritis    knuckles  . Asthma   . Carotid artery disease ([place])    Carotid US 7/14: Bilateral ICA 40-59 // Carotid US ([facility]) 2/17: bilat plaque without sig  stenosis  . Chronic diastolic CHF (congestive heart failure) ([place]) 07/26/2017   Echo 7/18:Severe conc LVH, EF 60-65, no RWMA, Gr 2 DD, MAC, mild MR // right and left heart cath 8/18: LVEDP 21  . Claustrophobia 07/26/2012  . COPD (chronic obstructive pulmonary disease) ([place])    "chronic bronchitis - about every winter"  . CSF leak    dizziness/headache - assoc symptoms // 2/2 encephalocele s/p skull base reconstruction  . Diverticulosis   . Dyspnea   . History of CVA (cerebrovascular accident) 03/2004   denies residual 07/26/2012  . Hx of gout   . Hyperlipidemia    takes Pravastatin daily  . Hypertensive heart disease with chronic diastolic congestive heart failure ([place]) 07/26/2017  . Internal hemorrhoids   . Kidney stones   . LVH (left ventricular hypertrophy)    Echo 8/18: Severe conc LVH, EF 60-65, no RWMA, Gr 2 DD, MAC, mild MR  . OSA (obstructive sleep apnea) 07/26/2012   prior CPAP - stopped due to being primary caregiver for dying parent (stopped in 2016)  . PSVT (paroxysmal supraventricular tachycardia) ([place])   . Pulmonary hypertension, unspecified ([place]) 08/14/2017   cath 07/2017 >>PASP 71m Hg, PA mean 39 mm Hg  . Type II diabetes mellitus ([place]     Past Surgical History:  Procedure Laterality Date  . ESOPHAGOGASTRODUODENOSCOPY (EGD) WITH PROPOFOL N/A 07/13/2015  Procedure: ESOPHAGOGASTRODUODENOSCOPY (EGD) WITH PROPOFOL;  Surgeon: Carol Ada, MD;  Location: [facility];  Service: Endoscopy;  Laterality: N/A;  . NASAL SINUS SURGERY  01/29/2012   Procedure: ENDOSCOPIC SINUS SURGERY WITH STEALTH;  Surgeon: Ruby Cola, MD;  Location: [facility];  Service: ENT;  Laterality: N/A;  . NM MYOVIEW LTD  08/25/2011   Low risk study, no evidence of ischemia, EF 44%  . REFRACTIVE SURGERY  ~ 2010   left  . RIGHT HEART CATH N/A 06/29/2020   Procedure: RIGHT HEART CATH;  Surgeon: Jolaine Artist, MD;  Location: [facility];  Service: Cardiovascular;  Laterality: N/A;  . RIGHT/LEFT  HEART CATH AND CORONARY ANGIOGRAPHY N/A 07/30/2017   Procedure: RIGHT/LEFT HEART CATH AND CORONARY ANGIOGRAPHY;  Surgeon: Martinique, Peter M, MD;  Location: [place] [facility];  Service: Cardiovascular;  Laterality: N/A;  . SPHENOIDECTOMY  01/29/2012   Procedure: SPHENOIDECTOMY;  Surgeon: Ruby Cola, MD;  Location: [facility];  Service: ENT;  Laterality: Left;  REPAIR OF LEFT SPHENOID    . SVT ABLATION N/A 05/21/2020   Procedure: SVT ABLATION;  Surgeon: Evans Lance, MD;  Location: [place] [facility];  Service: Cardiovascular;  Laterality: N/A;  . TOTAL ABDOMINAL HYSTERECTOMY  1994     Prior to Admission medications   Medication Sig Start Date End Date Taking? Authorizing Provider  acetaminophen (TYLENOL) 325 MG tablet Take 2 tablets (650 mg total) by mouth every 6 (six) hours as needed for mild pain. 06/30/18   Luetta Nutting, DO  albuterol (VENTOLIN HFA) 108 (90 Base) MCG/ACT inhaler TAKE 2 PUFFS BY MOUTH EVERY 6 HOURS AS NEEDED FOR WHEEZE OR SHORTNESS OF BREATH 12/19/19   Luetta Nutting, DO  allopurinol (ZYLOPRIM) 100 MG tablet Take 100 mg by mouth daily as needed (gout).  06/24/18   [provider]  apixaban (ELIQUIS) 5 MG TABS tablet Take 1 tablet (5 mg total) by mouth 2 (two) times daily. 06/20/20   Evans Lance, MD  Blood Glucose Monitoring Suppl (ONETOUCH VERIO) w/Device KIT USE TO CHECK BLOOD GLUCOSE 4 TIMES A DAY . DIAGNOSIS: TYPE 2 DIABETES E11.59 11/09/19   Luetta Nutting, DO  flecainide (TAMBOCOR) 50 MG tablet Take 1 tablet (50 mg total) by mouth 2 (two) times daily. 06/20/20   Evans Lance, MD  FLUoxetine (PROZAC) 20 MG capsule Take 1 capsule (20 mg total) by mouth at bedtime. 04/13/20   Nche, Charlene Brooke, NP  gabapentin (NEURONTIN) 400 MG capsule Take 1 capsule (400 mg total) by mouth at bedtime. 04/24/20   Nche, Charlene Brooke, NP  glucose blood (ONETOUCH VERIO) test strip For E11.22.  Use to check glucose qid 11/09/19   Luetta Nutting, DO  hydrALAZINE (APRESOLINE) 50 MG tablet  TAKE 1 TABLET BY MOUTH EVERY 8 HOURS. 10/08/20   Evans Lance, MD  isosorbide mononitrate (IMDUR) 60 MG 24 hr tablet TAKE 1 TABLET (60 MG TOTAL) BY MOUTH DAILY FOR 30 DAYS. ** DO NOT CRUSH ** 06/12/20   Nche, Charlene Brooke, NP  metolazone (ZAROXOLYN) 2.5 MG tablet Take one tablet ONCE a week on Wednesday ONLY. 08/08/20   Evans Lance, MD  nitroGLYCERIN (NITROSTAT) 0.4 MG SL tablet Place 1 tablet (0.4 mg total) under the tongue every 5 (five) minutes as needed. 07/27/17   Richardson Dopp T, PA-C  omeprazole (PRILOSEC) 20 MG capsule Take 1 capsule (20 mg total) by mouth daily. 04/24/20   Nche, Charlene Brooke, NP  potassium chloride (KLOR-CON) 10 MEQ tablet Take 2  tablets by mouth ONCE a week on Wednesday ONLY. 08/08/20   Evans Lance, MD  rosuvastatin (CRESTOR) 40 MG tablet Take 1 tablet (40 mg total) by mouth at bedtime. 10/16/20   Nche, Charlene Brooke, NP  topiramate (TOPAMAX) 25 MG tablet TAKE 1 TABLET BY MOUTH TWICE A DAY 01/02/20   Luetta Nutting, DO  torsemide (DEMADEX) 20 MG tablet Take 2 tablets (40 mg total) by mouth daily. 05/03/20   Lyda Jester M, PA-C  Verapamil HCl CR 300 MG CP24 TAKE 1 CAPSULE (300 MG TOTAL) BY MOUTH DAILY. PLEASE CALL TO SCHEDULE OVERDUE APPT WITH DR ROSS 04/04/20   Nche, Charlene Brooke, NP    Inpatient Medications: Scheduled Meds:  Continuous Infusions:  PRN Meds:   Allergies:    Allergies  Allergen Reactions  . Atorvastatin Other (See Comments) and Nausea And Vomiting    "legs cramped; moderate to almost severe" "legs cramped; moderate to almost severe"  . Ibuprofen Other (See Comments)    Other reaction(s): Other (See Comments) Per pt, MD doesn't want pt to take Per pt, MD doesn't want pt to take  . Metformin And Related Nausea And Vomiting  . Ace Inhibitors Cough and Nausea And Vomiting  . Hydrocodone-Acetaminophen Nausea And Vomiting  . Albuterol Other (See Comments)    Caused SVT, but pt is currently taking it.     Social History:   Social  History   Socioeconomic History  . Marital status: Single    Spouse name: Not on file  . Number of children: 0  . Years of education: Not on file  . Highest education level: Not on file  Occupational History  . Occupation: disabled  Tobacco Use  . Smoking status: Former Smoker    Packs/day: 0.50    Years: 35.00    Pack years: 17.50    Types: Cigarettes    Quit date: 08/16/2019    Years since quitting: 1.2  . Smokeless tobacco: Never Used  Vaping Use  . Vaping Use: Never used  Substance and Sexual Activity  . Alcohol use: Yes    Alcohol/week: 1.0 standard drink    Types: 1 Glasses of wine per week    Comment: rarely  . Drug use: No  . Sexual activity: Not Currently    Birth control/protection: Surgical  Other Topics Concern  . Not on file  Social History Narrative   Currently unemployed.   No children, not married   Social Determinants of Radio broadcast assistant Strain:   . Difficulty of Paying Living Expenses: Not on file  Food Insecurity:   . Worried About Charity fundraiser in the Last Year: Not on file  . Ran Out of Food in the Last Year: Not on file  Transportation Needs:   . Lack of Transportation (Medical): Not on file  . Lack of Transportation (Non-Medical): Not on file  Physical Activity:   . Days of Exercise per Week: Not on file  . Minutes of Exercise per Session: Not on file  Stress:   . Feeling of Stress : Not on file  Social Connections:   . Frequency of Communication with Friends and Family: Not on file  . Frequency of Social Gatherings with Friends and Family: Not on file  . Attends Religious Services: Not on file  . Active Member of Clubs or Organizations: Not on file  . Attends Archivist Meetings: Not on file  . Marital Status: Not on file  Intimate Partner Violence:   .  Fear of Current or Ex-Partner: Not on file  . Emotionally Abused: Not on file  . Physically Abused: Not on file  . Sexually Abused: Not on file    Family  History:   Family History  Problem Relation Age of Onset  . Colon cancer Sister 44  . Breast cancer Mother 77  . Alzheimer's disease Mother   . Diabetes Sister 15  . Heart failure Sister   . Diabetes Sister 69  . Lung cancer Father   . Breast cancer Sister   . Cancer Sister        mouth  . Anesthesia problems Neg Hx   . Hypotension Neg Hx   . Malignant hyperthermia Neg Hx   . Pseudochol deficiency Neg Hx    Family Status:  Family Status  Relation Name Status  . Sister  Alive  . Mother  Deceased  . Sister  Alive  . Sister  (Not Specified)  . Father  Deceased  . Sister  (Not Specified)  . Sister  (Not Specified)  . Neg Hx  (Not Specified)    ROS:  Please see the history of present illness.  All other ROS reviewed and negative.     Physical Exam/Data:   Vitals:   10/27/2020 1600 10/29/2020 1603 10/19/2020 1715 10/29/2020 1800  BP:  131/71 123/66 128/61  Pulse:  63 (!) 58 63  Resp:  (!) 22 (!) 22 (!) 22  Temp:  97.6 F (36.4 C)    TempSrc:  Oral    SpO2: 97% 95% 100% 97%  Weight:  118.8 kg    Height:  5' 2" (1.575 m)     No intake or output data in the 24 hours ending 10/23/2020 2035  Last 3 Weights 10/22/2020 10/23/2020 10/12/2020  Weight (lbs) 262 lb 270 lb 9.6 oz 264 lb 4.8 oz  Weight (kg) 118.842 kg 122.743 kg 119.886 kg     Body mass index is 47.92 kg/m.   General:  Well nourished, well developed, female in no acute distress HEENT: normal Lymph: no adenopathy Neck: JVD - impossible to see due to habitus Endocrine:  No thryomegaly Vascular: No carotid bruits; 4/4 extremity pulses 2+  Cardiac:  normal S1, split S2; RRR; no murmur Lungs:  A few rhonchi, but after cough clear bilaterally, no wheezing or wet rales  Abd: soft, nontender, no hepatomegaly  Ext: chronic changes of brawny edema, but also wrinkles, suggesting recent improvement in edema Musculoskeletal:  No deformities, BUE and BLE strength normal and equal Skin: warm and dry  Neuro:  CNs 2-12 intact,  no focal abnormalities noted Psych:  Normal affect   EKG:  The EKG was personally reviewed and demonstrates:  Sinus rhythm with long PR interval 280 Angel, nonspecific IVCD 120 Angel (atypical RBBB), QTc 533 Angel. Both QRS and QTc are broader than baseline Telemetry:  Telemetry was personally reviewed and demonstrates:  NSR   CV studies:   ECHO: 01/04/2020 1. Left ventricular ejection fraction, by visual estimation, is 45 to  50%. The left ventricle has low normal function. There is mildly increased left ventricular hypertrophy.  2. Left ventricular diastolic parameters are consistent with Grade II diastolic dysfunction (pseudonormalization).  3. Global right ventricle has moderately reduced systolic function.The right ventricular size is moderately enlarged. No increase in right ventricular wall thickness.  4. Left atrial size was mildly dilated.  5. Right atrial size was mildly dilated.  6. Moderate mitral annular calcification.  7. The mitral valve is abnormal. Mild  mitral valve regurgitation. Mild mitral stenosis.  8. The tricuspid valve is normal in structure.  9. The tricuspid valve is normal in structure. Tricuspid valve  regurgitation is trivial.  10. The aortic valve is grossly normal. Aortic valve regurgitation is not visualized.  11. The pulmonic valve was grossly normal. Pulmonic valve regurgitation is not visualized.  12. The inferior vena cava is dilated in size with >50% respiratory  variability, suggesting right atrial pressure of 8 mmHg.  13. The interatrial septum was not well visualized.   R HEART CATH: 06/29/2020 RA = 16 RV = 72/11 (22) PA = 70/30 (48) PCW = 25 Fick cardiac output/index = 4.0/1.9 Thermo CO/CI = 5.2/2.5 PVR = 5.7 (Fick) 4.4 (Thermo) Ao sat = 98% PA sat = 56%, 57%  Assessment: 1. Moderate mixed PH with pulmonary venous >> pulmonary arterial HTN  Plan/Discussion:  Will give lasix 80 IV in recovery. Increase torsemide to 40/20 (may need  more).   Discussed need for fluid restriction. Consider Cardiomems.    Laboratory Data:   Chemistry Recent Labs  Lab 10/09/2020 1611  NA 137  K 4.3  CL 95*  CO2 27  GLUCOSE 121*  BUN 53*  CREATININE 4.87*  CALCIUM 7.9*  GFRNONAA 9*  ANIONGAP 15    Lab Results  Component Value Date   ALT 11 04/17/2020   AST 18 04/17/2020   ALKPHOS 85 04/17/2020   BILITOT 0.9 04/17/2020   Hematology Recent Labs  Lab 10/17/2020 1611  WBC 7.1  RBC 4.40  HGB 11.9*  HCT 41.1  MCV 93.4  MCH 27.0  MCHC 29.0*  RDW 16.9*  PLT 209   Cardiac Enzymes High Sensitivity Troponin:  No results for input(s): TROPONINIHS in the last 720 hours.    BNP Recent Labs  Lab 10/19/2020 1628  BNP 468.3*    DDimer No results for input(s): DDIMER in the last 168 hours. TSH:  Lab Results  Component Value Date   TSH 4.742 (H) 08/17/2019   Lipids: Lab Results  Component Value Date   CHOL 209 (H) 10/12/2020   HDL 46 (L) 10/12/2020   LDLCALC 140 (H) 10/12/2020   LDLDIRECT 121.0 04/11/2020   TRIG 116 10/12/2020   CHOLHDL 4.5 10/12/2020   HgbA1c: Lab Results  Component Value Date   HGBA1C 6.1 (H) 10/12/2020   Magnesium:  Magnesium  Date Value Ref Range Status  04/24/2020 2.0 1.5 - 2.5 mg/dL Final     Radiology/Studies:  DG Chest Port 1 View  Result Date: 10/15/2020 CLINICAL DATA:  Shortness of breath EXAM: PORTABLE CHEST 1 VIEW COMPARISON:  09/12/2020 FINDINGS: Cardiomegaly with vascular congestion and mild hazy interstitial opacities suspicious for pulmonary edema. Suspected left pleural effusion. Aortic atherosclerosis. No pneumothorax. IMPRESSION: Cardiomegaly with vascular congestion and mild hazy interstitial opacities suspicious for pulmonary edema. Suspect left pleural effusion. Electronically Signed   By: Donavan Foil M.D.   On: 10/25/2020 19:45    Assessment and Plan:    Active Problems:   Morbid obesity due to excess calories (HCC)   Type 2 diabetes mellitus with diabetic  polyneuropathy (HCC)   OSA (obstructive sleep apnea)   Pulmonary hypertension, unspecified (HCC)   Type 2 diabetes mellitus with stage 3 chronic kidney disease, with long-term current use of insulin (HCC)   COPD (chronic obstructive pulmonary disease) (HCC)   Acute on chronic combined systolic and diastolic CHF (congestive heart failure) (HCC)   Paroxysmal atrial fibrillation (HCC)   AKI (acute kidney injury) ([place])   CHF (  congestive heart failure) ([place])   1. Acute renal failure (on CKD 3b): multiple episodes of AKI over last couple of years. Suspect underlying intrinsic kidney disease, but with acute decompensation due to low cardiac output. Dr. Roosevelt Locks told me he will be calling a Nephrology consult. Temporary hemodialysis is a possible, even probable need during the current admission. 2. Biventricular HF, acute on chronic: suspect dominant problem is low cardiac output (borderline cardiogenic shock) due to right heart failure. CXR shows minimal evidence of pulmonary vascular congestion, BNP elevation is relatively mild and O2 requirements are at baseline.  Will start inotropic/pulmonary vasodilator support with milrinone IV, dose adjusted for renal failure. Will need ICU bed, central line for CVP monitoring and Co-ox, repeat echo. Hold off additional diuretics until milrinone started. Continue hydralazine-nitrates as BP allows. Will ask HF service to see in AM.  3. Chronic resp failure w hypoxia: on O2 3l at home, currently 99% sat at rest on the same amount. 4. SVT: wearing a Zio-patch right now. Unable to induce SVT at EP study earlier this year. Will hold verapamil since I think she needs the higher heart rate to achieve adequate cardiac output. I am concerned about her broadening QRS and flecainide use. Will hold flecainide until we reevaluate the LVEF.  Risk of precipitating SVT is there, but will deal with that as needed. Unfortunately, there are very few (if any) truly safe antiarrhythmic  choices for her. 5. OSA/COPD overlap: has never really used CPAP. FEV1 and FVC are both roughly 50% of predicted on PFTs 2019. DLCO 60% corrects for alveolar volume. Probably obesity related restrictive lung disease is also involved. Quit smoking about 1 year ago. Had wheezing with beta blockers. 6. Severe PAH: with mixed etiology due to left heart failure and intrinsic arteriolar disease, likely due to OSA/COPD. 7. Borderline TSH: has some complaints of chilliness and maybe myxedematous features. Considering her acute illness, heart rate is remarkably normal/slow, even accounting for verapamil. Recheck TSH. 8. DM: well controlled, A1c 6.1%. Morbid obesity.  CRITICAL CARE Performed by: Dani Gobble Geral Tuch   Total critical care time: 55 minutes  Critical care time was exclusive of separately billable procedures and treating other patients.  Critical care was necessary to treat or prevent imminent or life-threatening deterioration.  Critical care was time spent personally by me on the following activities: development of treatment plan with patient and/or surrogate as well as nursing, discussions with consultants, evaluation of patient's response to treatment, examination of patient, obtaining history from patient or surrogate, ordering and performing treatments and interventions, ordering and review of laboratory studies, ordering and review of radiographic studies, pulse oximetry and re-evaluation of patient's condition.   For questions or updates, please contact [facility] Please consult [email or web address] for contact info under Cardiology/STEMI.   Signed, Sanda Klein, MD  10/17/2020 8:35 PM

## 2020-10-30 NOTE — Telephone Encounter (Signed)
[facility] called to report pt was being taken by EMS to the ER.  creatinine today is 4.1 Friday creatinine was 2.55. Pt did not receive IV lasix Friday due to elevated creatinine. Torsemide held today due to elevated creatinine. Pt said she felt terrible and [facility] called 911 for pt to go to the emergency room.  Routed to [place] as FYI   [facility] call back # [phone]

## 2020-10-30 NOTE — ED Triage Notes (Addendum)
Patient presents to the ED by EMS sent by home health for evaluation of renal failure. Her creatinine was >4 yesterday 3.8. Denies any other complaints.    Link Snuffer NP with [facility] sending: [phone]

## 2020-10-31 ENCOUNTER — Inpatient Hospital Stay (HOSPITAL_COMMUNITY): Payer: Medicare Other

## 2020-10-31 DIAGNOSIS — I5042 Chronic combined systolic (congestive) and diastolic (congestive) heart failure: Secondary | ICD-10-CM | POA: Diagnosis not present

## 2020-10-31 DIAGNOSIS — R569 Unspecified convulsions: Secondary | ICD-10-CM

## 2020-10-31 DIAGNOSIS — N179 Acute kidney failure, unspecified: Secondary | ICD-10-CM | POA: Diagnosis not present

## 2020-10-31 DIAGNOSIS — I5043 Acute on chronic combined systolic (congestive) and diastolic (congestive) heart failure: Secondary | ICD-10-CM

## 2020-10-31 LAB — RENAL FUNCTION PANEL
Albumin: 3.7 g/dL (ref 3.5–5.0)
Anion gap: 15 (ref 5–15)
BUN: 51 mg/dL — ABNORMAL HIGH (ref 8–23)
CO2: 28 mmol/L (ref 22–32)
Calcium: 7.8 mg/dL — ABNORMAL LOW (ref 8.9–10.3)
Chloride: 96 mmol/L — ABNORMAL LOW (ref 98–111)
Creatinine, Ser: 4.68 mg/dL — ABNORMAL HIGH (ref 0.44–1.00)
GFR, Estimated: 10 mL/min — ABNORMAL LOW (ref >60)
Glucose, Bld: 144 mg/dL — ABNORMAL HIGH (ref 70–99)
Phosphorus: 6.5 mg/dL — ABNORMAL HIGH (ref 2.5–4.6)
Potassium: 4.1 mmol/L (ref 3.5–5.1)
Sodium: 139 mmol/L (ref 135–145)

## 2020-10-31 LAB — BASIC METABOLIC PANEL
Anion gap: 16 — ABNORMAL HIGH (ref 5–15)
BUN: 55 mg/dL — ABNORMAL HIGH (ref 8–23)
CO2: 23 mmol/L (ref 22–32)
Calcium: 7.8 mg/dL — ABNORMAL LOW (ref 8.9–10.3)
Chloride: 98 mmol/L (ref 98–111)
Creatinine, Ser: 4.81 mg/dL — ABNORMAL HIGH (ref 0.44–1.00)
GFR, Estimated: 10 mL/min — ABNORMAL LOW (ref >60)
Glucose, Bld: 168 mg/dL — ABNORMAL HIGH (ref 70–99)
Potassium: 4.2 mmol/L (ref 3.5–5.1)
Sodium: 137 mmol/L (ref 135–145)

## 2020-10-31 LAB — POCT I-STAT 7, (LYTES, BLD GAS, ICA,H+H)
Acid-Base Excess: 5 mmol/L — ABNORMAL HIGH (ref 0.0–2.0)
Acid-Base Excess: 4 mmol/L — ABNORMAL HIGH (ref 0.0–2.0)
Bicarbonate: 31.3 mmol/L — ABNORMAL HIGH (ref 20.0–28.0)
Bicarbonate: 31.8 mmol/L — ABNORMAL HIGH (ref 20.0–28.0)
Calcium, Ion: 0.97 mmol/L — ABNORMAL LOW (ref 1.15–1.40)
Calcium, Ion: 1.01 mmol/L — ABNORMAL LOW (ref 1.15–1.40)
HCT: 36 % (ref 36.0–46.0)
HCT: 36 % (ref 36.0–46.0)
Hemoglobin: 12.2 g/dL (ref 12.0–15.0)
Hemoglobin: 12.2 g/dL (ref 12.0–15.0)
O2 Saturation: 94 %
O2 Saturation: 99 %
Patient temperature: 97.5
Potassium: 4 mmol/L (ref 3.5–5.1)
Potassium: 4.2 mmol/L (ref 3.5–5.1)
Sodium: 139 mmol/L (ref 135–145)
Sodium: 139 mmol/L (ref 135–145)
TCO2: 33 mmol/L — ABNORMAL HIGH (ref 22–32)
TCO2: 34 mmol/L — ABNORMAL HIGH (ref 22–32)
pCO2 arterial: 53.9 mmHg — ABNORMAL HIGH (ref 32.0–48.0)
pCO2 arterial: 63.7 mmHg — ABNORMAL HIGH (ref 32.0–48.0)
pH, Arterial: 7.37 (ref 7.350–7.450)
pH, Arterial: 7.306 — ABNORMAL LOW (ref 7.350–7.450)
pO2, Arterial: 73 mmHg — ABNORMAL LOW (ref 83.0–108.0)
pO2, Arterial: 135 mmHg — ABNORMAL HIGH (ref 83.0–108.0)

## 2020-10-31 LAB — ECHOCARDIOGRAM COMPLETE
Height: 62 in
Weight: 4088.21 oz

## 2020-10-31 LAB — CBC
HCT: 41.7 % (ref 36.0–46.0)
Hemoglobin: 12.1 g/dL (ref 12.0–15.0)
MCH: 26.7 pg (ref 26.0–34.0)
MCHC: 29 g/dL — ABNORMAL LOW (ref 30.0–36.0)
MCV: 92.1 fL (ref 80.0–100.0)
Platelets: 239 10*3/uL (ref 150–400)
RBC: 4.53 MIL/uL (ref 3.87–5.11)
RDW: 17 % — ABNORMAL HIGH (ref 11.5–15.5)
WBC: 10.2 10*3/uL (ref 4.0–10.5)
nRBC: 0 % (ref 0.0–0.2)

## 2020-10-31 LAB — GLUCOSE, CAPILLARY
Glucose-Capillary: 141 mg/dL — ABNORMAL HIGH (ref 70–99)
Glucose-Capillary: 117 mg/dL — ABNORMAL HIGH (ref 70–99)
Glucose-Capillary: 158 mg/dL — ABNORMAL HIGH (ref 70–99)

## 2020-10-31 LAB — URIC ACID: Uric Acid, Serum: 12.2 mg/dL — ABNORMAL HIGH (ref 2.5–7.1)

## 2020-10-31 LAB — AMMONIA: Ammonia: 21 umol/L (ref 9–35)

## 2020-10-31 LAB — COOXEMETRY PANEL
Carboxyhemoglobin: 1 % (ref 0.5–1.5)
Methemoglobin: 0.9 % (ref 0.0–1.5)
O2 Saturation: 85.6 %
Total hemoglobin: 11.7 g/dL — ABNORMAL LOW (ref 12.0–16.0)

## 2020-10-31 LAB — TSH: TSH: 5.691 u[IU]/mL — ABNORMAL HIGH (ref 0.350–4.500)

## 2020-10-31 LAB — MRSA PCR SCREENING: MRSA by PCR: NEGATIVE

## 2020-10-31 MED ORDER — LORAZEPAM 2 MG/ML IJ SOLN
1.0000 mg | Freq: Once | INTRAMUSCULAR | Status: AC
  Administered 2020-10-31: 1 mg via INTRAVENOUS

## 2020-10-31 MED ORDER — FENTANYL 2500MCG IN NS 250ML (10MCG/ML) PREMIX INFUSION
0.0000 ug/h | INTRAVENOUS | Status: DC
  Administered 2020-10-31: 50 ug/h via INTRAVENOUS
  Administered 2020-11-01: 125 ug/h via INTRAVENOUS
  Administered 2020-11-02: 300 ug/h via INTRAVENOUS
  Filled 2020-10-31 (×3): qty 250

## 2020-10-31 MED ORDER — HEPARIN SODIUM (PORCINE) 1000 UNIT/ML IJ SOLN
4000.0000 [IU] | Freq: Once | INTRAMUSCULAR | Status: AC
  Administered 2020-10-31: 3100 [IU] via INTRAVENOUS

## 2020-10-31 MED ORDER — ROCURONIUM BROMIDE 50 MG/5ML IV SOLN
100.0000 mg | Freq: Once | INTRAVENOUS | Status: AC
  Administered 2020-10-31: 60 mg via INTRAVENOUS
  Filled 2020-10-31: qty 10

## 2020-10-31 MED ORDER — CALCIUM GLUCONATE-NACL 1-0.675 GM/50ML-% IV SOLN: 1.0000 g | Freq: Once | INTRAVENOUS | Status: DC

## 2020-10-31 MED ORDER — ALBUTEROL SULFATE HFA 108 (90 BASE) MCG/ACT IN AERS: 1.0000 | INHALATION_SPRAY | Freq: Four times a day (QID) | RESPIRATORY_TRACT | Status: DC | PRN

## 2020-10-31 MED ORDER — INSULIN ASPART 100 UNIT/ML ~~LOC~~ SOLN
0.0000 [IU] | Freq: Three times a day (TID) | SUBCUTANEOUS | Status: DC
  Administered 2020-10-31: 1 [IU] via SUBCUTANEOUS

## 2020-10-31 MED ORDER — MIDAZOLAM HCL 2 MG/2ML IJ SOLN
INTRAMUSCULAR | Status: AC
  Administered 2020-10-31: 2 mg
  Filled 2020-10-31: qty 4

## 2020-10-31 MED ORDER — ACETAMINOPHEN 325 MG PO TABS: 650.0000 mg | ORAL_TABLET | ORAL | Status: DC | PRN

## 2020-10-31 MED ORDER — FENTANYL BOLUS VIA INFUSION
25.0000 ug | INTRAVENOUS | Status: DC | PRN
  Administered 2020-10-31 – 2020-11-01 (×2): 25 ug via INTRAVENOUS
  Filled 2020-10-31: qty 25

## 2020-10-31 MED ORDER — FUROSEMIDE 10 MG/ML IJ SOLN
160.0000 mg | Freq: Once | INTRAVENOUS | Status: AC
  Administered 2020-10-31: 160 mg via INTRAVENOUS
  Filled 2020-10-31: qty 10

## 2020-10-31 MED ORDER — ORAL CARE MOUTH RINSE
15.0000 mL | Freq: Two times a day (BID) | OROMUCOSAL | Status: DC
  Administered 2020-10-31 (×2): 15 mL via OROMUCOSAL

## 2020-10-31 MED ORDER — HEPARIN (PORCINE) 25000 UT/250ML-% IV SOLN
700.0000 [IU]/h | INTRAVENOUS | Status: DC
  Administered 2020-10-31: 1100 [IU]/h via INTRAVENOUS
  Administered 2020-11-01: 600 [IU]/h via INTRAVENOUS
  Filled 2020-10-31 (×2): qty 250

## 2020-10-31 MED ORDER — LORAZEPAM 2 MG/ML IJ SOLN: 1.0000 mg | INTRAMUSCULAR | Status: DC | PRN

## 2020-10-31 MED ORDER — NOREPINEPHRINE 4 MG/250ML-% IV SOLN
0.0000 ug/min | INTRAVENOUS | Status: DC
  Administered 2020-10-31: 10 ug/min via INTRAVENOUS
  Administered 2020-10-31: 16 ug/min via INTRAVENOUS
  Administered 2020-11-01: 9 ug/min via INTRAVENOUS
  Administered 2020-11-01: 11 ug/min via INTRAVENOUS
  Administered 2020-11-01: 16 ug/min via INTRAVENOUS
  Administered 2020-11-01 – 2020-11-02 (×3): 9 ug/min via INTRAVENOUS
  Filled 2020-10-31 (×8): qty 250

## 2020-10-31 MED ORDER — ROSUVASTATIN CALCIUM 5 MG PO TABS: 10.0000 mg | ORAL_TABLET | Freq: Every day | ORAL | Status: DC

## 2020-10-31 MED ORDER — CHLORHEXIDINE GLUCONATE CLOTH 2 % EX PADS
6.0000 | MEDICATED_PAD | Freq: Every day | CUTANEOUS | Status: DC
  Administered 2020-10-31: 6 via TOPICAL

## 2020-10-31 MED ORDER — SODIUM CHLORIDE 0.9 % IV SOLN
1.0000 g | Freq: Every day | INTRAVENOUS | Status: DC
  Administered 2020-10-31 – 2020-11-01 (×2): 1 g via INTRAVENOUS
  Filled 2020-10-31: qty 10
  Filled 2020-10-31 (×2): qty 1

## 2020-10-31 MED ORDER — ROCURONIUM BROMIDE 50 MG/5ML IV SOLN
80.0000 mg | Freq: Once | INTRAVENOUS | Status: AC
  Administered 2020-10-31: 80 mg via INTRAVENOUS
  Filled 2020-10-31: qty 8

## 2020-10-31 MED ORDER — ETOMIDATE 2 MG/ML IV SOLN
INTRAVENOUS | Status: AC
  Administered 2020-10-31: 20 mg
  Filled 2020-10-31: qty 20

## 2020-10-31 MED ORDER — PRISMASOL BGK 4/2.5 32-4-2.5 MEQ/L EC SOLN: Status: DC

## 2020-10-31 MED ORDER — FENTANYL CITRATE (PF) 100 MCG/2ML IJ SOLN
50.0000 ug | Freq: Once | INTRAMUSCULAR | Status: AC
  Administered 2020-10-31: 50 ug via INTRAVENOUS

## 2020-10-31 MED ORDER — ROCURONIUM BROMIDE 10 MG/ML (PF) SYRINGE
PREFILLED_SYRINGE | INTRAVENOUS | Status: AC
  Filled 2020-10-31: qty 10

## 2020-10-31 MED ORDER — MIDAZOLAM BOLUS VIA INFUSION
2.0000 mg | Freq: Once | INTRAVENOUS | Status: DC
  Filled 2020-10-31: qty 2

## 2020-10-31 MED ORDER — FENTANYL CITRATE (PF) 100 MCG/2ML IJ SOLN
INTRAMUSCULAR | Status: AC
  Filled 2020-10-31: qty 2

## 2020-10-31 MED ORDER — ETOMIDATE 2 MG/ML IV SOLN
INTRAVENOUS | Status: AC
  Administered 2020-10-31: 10 mg via INTRAVENOUS
  Filled 2020-10-31: qty 20

## 2020-10-31 MED ORDER — CALCIUM GLUCONATE-NACL 1-0.675 GM/50ML-% IV SOLN
1.0000 g | Freq: Once | INTRAVENOUS | Status: AC
  Administered 2020-10-31: 1000 mg via INTRAVENOUS
  Filled 2020-10-31: qty 50

## 2020-10-31 MED ORDER — PHENYLEPHRINE 40 MCG/ML (10ML) SYRINGE FOR IV PUSH (FOR BLOOD PRESSURE SUPPORT)
PREFILLED_SYRINGE | INTRAVENOUS | Status: AC
  Administered 2020-10-31: 200 ug
  Filled 2020-10-31: qty 10

## 2020-10-31 MED ORDER — MIDAZOLAM HCL 2 MG/2ML IJ SOLN: 4.0000 mg | Freq: Once | INTRAMUSCULAR | Status: AC

## 2020-10-31 MED ORDER — ALBUTEROL SULFATE (2.5 MG/3ML) 0.083% IN NEBU: 2.5000 mg | INHALATION_SOLUTION | Freq: Four times a day (QID) | RESPIRATORY_TRACT | Status: DC | PRN

## 2020-10-31 MED ORDER — ETOMIDATE 2 MG/ML IV SOLN: 20.0000 mg | Freq: Once | INTRAVENOUS | Status: AC

## 2020-10-31 MED ORDER — PRISMASOL BGK 4/2.5 32-4-2.5 MEQ/L REPLACEMENT SOLN: Status: DC

## 2020-10-31 MED ORDER — FENTANYL CITRATE (PF) 100 MCG/2ML IJ SOLN
INTRAMUSCULAR | Status: AC
  Administered 2020-10-31: 100 ug
  Filled 2020-10-31: qty 2

## 2020-10-31 MED ORDER — ETOMIDATE 2 MG/ML IV SOLN: 10.0000 mg | Freq: Once | INTRAVENOUS | Status: AC

## 2020-10-31 MED ORDER — PANTOPRAZOLE SODIUM 40 MG PO PACK
40.0000 mg | PACK | Freq: Every day | ORAL | Status: DC
  Administered 2020-11-01 – 2020-11-02 (×2): 40 mg
  Filled 2020-10-31 (×2): qty 20

## 2020-10-31 MED ORDER — ROSUVASTATIN CALCIUM 5 MG PO TABS
10.0000 mg | ORAL_TABLET | Freq: Every day | ORAL | Status: DC
  Administered 2020-10-31 – 2020-11-01 (×2): 10 mg
  Filled 2020-10-31 (×2): qty 2

## 2020-10-31 MED ORDER — DEXMEDETOMIDINE HCL IN NACL 400 MCG/100ML IV SOLN
0.4000 ug/kg/h | INTRAVENOUS | Status: DC
  Administered 2020-10-31 – 2020-11-01 (×4): 0.7 ug/kg/h via INTRAVENOUS
  Administered 2020-11-01: 1 ug/kg/h via INTRAVENOUS
  Administered 2020-11-01 (×2): 0.9 ug/kg/h via INTRAVENOUS
  Administered 2020-11-02 (×2): 1 ug/kg/h via INTRAVENOUS
  Administered 2020-11-02: 0.5 ug/kg/h via INTRAVENOUS
  Filled 2020-10-31 (×9): qty 100

## 2020-10-31 MED ORDER — LORAZEPAM 2 MG/ML IJ SOLN
1.0000 mg | INTRAMUSCULAR | Status: DC | PRN
  Filled 2020-10-31: qty 1

## 2020-10-31 MED ORDER — CHLORHEXIDINE GLUCONATE 0.12% ORAL RINSE (MEDLINE KIT)
15.0000 mL | Freq: Two times a day (BID) | OROMUCOSAL | Status: DC
  Administered 2020-10-31 – 2020-11-02 (×4): 15 mL via OROMUCOSAL

## 2020-10-31 MED ORDER — FENTANYL CITRATE (PF) 100 MCG/2ML IJ SOLN: 100.0000 ug | Freq: Once | INTRAMUSCULAR | Status: AC

## 2020-10-31 MED ORDER — LORAZEPAM 2 MG/ML IJ SOLN: 2.0000 mg | INTRAMUSCULAR | Status: DC | PRN

## 2020-10-31 MED ORDER — ORAL CARE MOUTH RINSE
15.0000 mL | OROMUCOSAL | Status: DC
  Administered 2020-10-31 – 2020-11-02 (×17): 15 mL via OROMUCOSAL

## 2020-10-31 MED ORDER — HEPARIN SODIUM (PORCINE) 1000 UNIT/ML DIALYSIS
1000.0000 [IU] | INTRAMUSCULAR | Status: DC | PRN
  Filled 2020-10-31: qty 4

## 2020-10-31 MED ORDER — INSULIN ASPART 100 UNIT/ML ~~LOC~~ SOLN: 0.0000 [IU] | Freq: Every day | SUBCUTANEOUS | Status: DC

## 2020-10-31 MED ORDER — MIDAZOLAM HCL 2 MG/2ML IJ SOLN
INTRAMUSCULAR | Status: AC
  Administered 2020-10-31: 4 mg via INTRAVENOUS
  Filled 2020-10-31: qty 4

## 2020-10-31 NOTE — Procedures (Signed)
Intubation Procedure Note  [name]  [id number]  12-08-1956  Date:10/31/20  Time:8:52 PM   Provider Performing:Kaitlinn Iversen    Procedure: 64 yo with seizure and compromised airway requiring intubation.  She self extubated and required re-intubation ([id number])  Indication(s) Respiratory Failure  Consent Unable to obtain consent due to emergent nature of procedure.   Anesthesia Etomidate, Versed, Fentanyl and Rocuronium   Time Out Verified patient identification, verified procedure, site/side was marked, verified correct patient position, special equipment/implants available, medications/allergies/relevant history reviewed, required imaging and test results available.   Sterile Technique Usual hand hygeine, masks, and gloves were used   Procedure Description Patient positioned in bed supine.  Sedation given as noted above.  Patient was intubated with endotracheal tube using Glidescope.  View was Grade 1 full glottis .  Number of attempts was 1.  Colorimetric CO2 detector was consistent with tracheal placement.  Confirmed with ausculation.  7.5 ETT advanced to 23 cm at lip.  Noted to have pooled old blood in posterior pharynx and around glottis.  No evidence for active bleeding.   Complications/Tolerance None; patient tolerated the procedure well. Chest X-ray is ordered to verify placement.  Angel Mires, MD [place] Pager - [phone] 10/31/2020, 8:54 PM

## 2020-10-31 NOTE — Progress Notes (Signed)
°  Echocardiogram 2D Echocardiogram has been performed.  [name] 10/31/2020, 4:10 PM

## 2020-10-31 NOTE — Progress Notes (Signed)
RT x2 assisted Dr. Tamala Julian for bedside bronchoscopy.  BAL performed and sample obtained from LLL. Pt tolerated procedure well. RT will continue to monitor and be available as needed.

## 2020-10-31 NOTE — Progress Notes (Signed)
Family and patient have elected to proceed with intubation, CRRT trial with inotropes after discussion with nephrology.  Will touch base with CHF team as well.  Patient more lucid and agrees with plan.  Erskine Emery MD PCCM

## 2020-10-31 NOTE — Consult Note (Signed)
[facility] Renal Consultation Note  Requesting MD: Eulogio Bear, DO Indication for Consultation:  AKI  Chief complaint: directed to come for worsening labs/resp status  HPI:  [name] is a 64 y.o. female is a history of chronic combined systolic and diastolic CHF, chronic hypoxic respiratory failure on home oxygen, CKD, hypertension, and paroxysmal atrial fibrillation who presented to the hospital after labs demonstrated worsening renal function and worsening clinical status.  Note that patient has been receiving IV Lasix at home for heart failure and weight gain.   Per cardiology charting her Creatinine had increased from 2.55 to 4.1 over the course of a couple of days and this was accompanied by a decrease in urine output.  Creatinine trends in the [facility] system are noted below -note for reference creatinine has risen from 1.92 on 09/13/2020 up to 4.8 on her date of presentation.  Nephrology is consulted for assistance with AKI.  Critical care was consulted today as well and is now her primary team.  CHF team and neurology have been consulted.  Her sister is at bedside and states that she has always told her family that she would not want long-term dialysis; the patient confirms this.  The patient had a seizure earlier and our conversation appears to be the most lucid that she has had today per family's report.  At home she has been on 3 L of oxygen and desats to the 70s with movement.  She has had jerking movements and AMS (trouble with recall mostly) and a seizure - all new here at the hospital.  She has been on neurontin at home which has been held for the past couple of days per her sister.  At home she was on metolazone 2.5 mg weekly and torsemide 60 mg daily previously (before the IV lasix?). She had been getting daily IV lasix at home and home monitoring.  Note that she got lasix 160 mg IV early AM on 11/24 with 200 mL UOP charted.  She has been started on milrinone per  cardiology (new here per her niece).  Note she was last seen in the office at [facility] 05/17/2020.  Creatinine has been 1.99 on 05/10/2020.  She was seen in the [place] clinic that day and there were plans for 107-monthfollow-up.  She canceled 2 appointments and did not attend another scheduled visit - stating it was hard to get anywhere or move around.  With regard to her goals of care, she states that she is not ready to call hospice yet and that she is willing to try dialysis in the short-term to help her breathing.  We thoughtfully discussed that we do not know whether or not she would need dialysis short-term or long-term yet at this point.  I asked that she please continue to consider goals of care and to please let u[place] if anything changes.  She states that she has seen a former significant other go through dialysis.  We have discussed the risks benefits indications of dialysis and the patient (who is post-ictal) with the support of her sister and family/nieces does want to proceed with dialysis.  The patient does not have a spouse or child or living parent.  Her sister is her closest relative and makes her medical decisions   Creatinine, Ser  Date/Time Value Ref Range Status  10/29/2020 11:28 PM 4.81 (H) 0.44 - 1.00 mg/dL Final  10/18/2020 04:11 PM 4.87 (H) 0.44 - 1.00 mg/dL Final  09/13/2020 10:15  AM 1.92 (H) 0.40 - 1.20 mg/dL Final  06/26/2020 10:45 AM 1.98 (H) 0.44 - 1.00 mg/dL Final  05/18/2020 09:57 AM 1.79 (H) 0.57 - 1.00 mg/dL Final  05/10/2020 12:18 PM 1.99 (H) 0.44 - 1.00 mg/dL Final  05/03/2020 11:22 AM 2.06 (H) 0.44 - 1.00 mg/dL Final  05/01/2020 01:00 PM 1.50 (H) 0.44 - 1.00 mg/dL Final  04/24/2020 02:24 PM 1.78 (H) 0.40 - 1.20 mg/dL Final  04/20/2020 06:46 AM 3.33 (H) 0.44 - 1.00 mg/dL Final  04/19/2020 06:23 AM 3.18 (H) 0.44 - 1.00 mg/dL Final  04/18/2020 08:20 AM 2.40 (H) 0.44 - 1.00 mg/dL Final  04/17/2020 03:42 AM 2.07 (H) 0.44 - 1.00 mg/dL Final  04/16/2020 01:04 PM  1.97 (H) 0.44 - 1.00 mg/dL Final  04/16/2020 12:40 PM 2.05 (H) 0.44 - 1.00 mg/dL Final  04/03/2020 02:06 PM 2.16 (H) 0.44 - 1.00 mg/dL Final  12/14/2019 02:00 PM 1.37 (H) 0.40 - 1.20 mg/dL Final  11/29/2019 11:00 AM 1.66 (H) 0.44 - 1.00 mg/dL Final  11/15/2019 12:50 PM 1.94 (H) 0.44 - 1.00 mg/dL Final  10/14/2019 01:57 PM 1.53 (H) 0.40 - 1.20 mg/dL Final  09/01/2019 02:22 PM 1.72 (H) 0.44 - 1.00 mg/dL Final  08/26/2019 01:46 AM 1.79 (H) 0.44 - 1.00 mg/dL Final  08/25/2019 02:25 AM 1.78 (H) 0.44 - 1.00 mg/dL Final  08/24/2019 04:35 AM 1.79 (H) 0.44 - 1.00 mg/dL Final  08/23/2019 03:20 AM 2.31 (H) 0.44 - 1.00 mg/dL Final  08/22/2019 04:15 AM 2.69 (H) 0.44 - 1.00 mg/dL Final  08/21/2019 11:33 PM 2.57 (H) 0.44 - 1.00 mg/dL Final  08/21/2019 05:27 AM 2.27 (H) 0.44 - 1.00 mg/dL Final  08/20/2019 06:33 AM 2.73 (H) 0.44 - 1.00 mg/dL Final  08/19/2019 04:12 AM 2.64 (H) 0.44 - 1.00 mg/dL Final  08/18/2019 05:37 AM 2.21 (H) 0.44 - 1.00 mg/dL Final  08/17/2019 05:10 AM 1.80 (H) 0.44 - 1.00 mg/dL Final  08/16/2019 05:45 PM 1.72 (H) 0.44 - 1.00 mg/dL Final  07/04/2019 10:57 AM 1.53 (H) 0.40 - 1.20 mg/dL Final  06/27/2019 11:01 AM 1.70 (H) 0.40 - 1.20 mg/dL Final  05/23/2019 12:01 PM 0.96 0.40 - 1.20 mg/dL Final  01/05/2019 01:42 PM 1.15 0.40 - 1.20 mg/dL Final  12/29/2018 10:50 AM 1.31 (H) 0.44 - 1.00 mg/dL Final  11/18/2018 02:28 PM 1.28 (H) 0.40 - 1.20 mg/dL Final  06/30/2018 01:42 PM 1.79 (H) 0.40 - 1.20 mg/dL Final  05/12/2018 03:57 AM 1.46 (H) 0.44 - 1.00 mg/dL Final  03/31/2018 10:47 AM 1.16 0.40 - 1.20 mg/dL Final  03/01/2018 08:52 PM 1.28 (H) 0.44 - 1.00 mg/dL Final  07/27/2017 12:18 PM 1.02 (H) 0.57 - 1.00 mg/dL Final  07/21/2017 01:00 PM 1.31 (H) 0.57 - 1.00 mg/dL Final  07/13/2017 04:45 PM 1.22 (H) 0.57 - 1.00 mg/dL Final  06/22/2017 02:27 PM 0.98 0.44 - 1.00 mg/dL Final  06/18/2017 12:12 PM 1.40 (H) 0.44 - 1.00 mg/dL Final  09/30/2013 02:33 PM 1.20 (H) 0.50 - 1.10 mg/dL Final   06/17/2013 06:57 PM 1.40 (H) 0.50 - 1.10 mg/dL Final  06/17/2013 06:12 PM 1.27 (H) 0.50 - 1.10 mg/dL Final  07/26/2012 03:12 PM 1.21 (H) 0.50 - 1.10 mg/dL Final     PMHx:   Past Medical History:  Diagnosis Date  . Anxiety    doesn't take any meds for this  . Arteriosclerosis of coronary artery 08/31/2011   Myoview 9/12: EF 44, no ischemia // Myoview 8/18: EF 57, low risk, possible small area of inferior ischemia // Minor  nonobstructive CAD by cardiac catheterization - LHC 8/18: Proximal LAD 35, OM2 25  . Arthritis    knuckles  . Asthma   . Carotid artery disease ([place])    Carotid US 7/14: Bilateral ICA 40-59 // Carotid US ([facility]) 2/17: bilat plaque without sig stenosis  . Chronic diastolic CHF (congestive heart failure) ([place]) 07/26/2017   Echo 7/18:Severe conc LVH, EF 60-65, no RWMA, Gr 2 DD, MAC, mild MR // right and left heart cath 8/18: LVEDP 21  . Claustrophobia 07/26/2012  . COPD (chronic obstructive pulmonary disease) ([place])    "chronic bronchitis - about every winter"  . CSF leak    dizziness/headache - assoc symptoms // 2/2 encephalocele s/p skull base reconstruction  . Diverticulosis   . Dyspnea   . History of CVA (cerebrovascular accident) 03/2004   denies residual 07/26/2012  . Hx of gout   . Hyperlipidemia    takes Pravastatin daily  . Hypertensive heart disease with chronic diastolic congestive heart failure ([place]) 07/26/2017  . Internal hemorrhoids   . Kidney stones   . LVH (left ventricular hypertrophy)    Echo 8/18: Severe conc LVH, EF 60-65, no RWMA, Gr 2 DD, MAC, mild MR  . OSA (obstructive sleep apnea) 07/26/2012   prior CPAP - stopped due to being primary caregiver for dying parent (stopped in 2016)  . PSVT (paroxysmal supraventricular tachycardia) ([place])   . Pulmonary hypertension, unspecified ([place]) 08/14/2017   cath 07/2017 >>PASP 32m Hg, PA mean 39 mm Hg  . Type II diabetes mellitus ([place]     Past Surgical History:  Procedure Laterality Date  .  ESOPHAGOGASTRODUODENOSCOPY (EGD) WITH PROPOFOL N/A 07/13/2015   Procedure: ESOPHAGOGASTRODUODENOSCOPY (EGD) WITH PROPOFOL;  Surgeon: PCarol Ada MD;  Location: [facility];  Service: Endoscopy;  Laterality: N/A;  . NASAL SINUS SURGERY  01/29/2012   Procedure: ENDOSCOPIC SINUS SURGERY WITH STEALTH;  Surgeon: MRuby Cola MD;  [facility]  Service: ENT;  Laterality: N/A;  . NM MYOVIEW LTD  08/25/2011   Low risk study, no evidence of ischemia, EF 44%  . REFRACTIVE SURGERY  ~ 2010   left  . RIGHT HEART CATH N/A 06/29/2020   Procedure: RIGHT HEART CATH;  Surgeon: BJolaine Artist MD;  [facility];  Service: Cardiovascular;  Laterality: N/A;  . RIGHT/LEFT HEART CATH AND CORONARY ANGIOGRAPHY N/A 07/30/2017   Procedure: RIGHT/LEFT HEART CATH AND CORONARY ANGIOGRAPHY;  Surgeon: JMartinique Peter M, MD;  [facility];  Service: Cardiovascular;  Laterality: N/A;  . SPHENOIDECTOMY  01/29/2012   Procedure: SPHENOIDECTOMY;  Surgeon: MRuby Cola MD;  [facility]  Service: ENT;  Laterality: Left;  REPAIR OF LEFT SPHENOID    . SVT ABLATION N/A 05/21/2020   Procedure: SVT ABLATION;  Surgeon: TEvans Lance MD;  [facility];  Service: Cardiovascular;  Laterality: N/A;  . TOTAL ABDOMINAL HYSTERECTOMY  1994    Family Hx:  Family History  Problem Relation Age of Onset  . Colon cancer Sister 432 . Breast cancer Mother 633 . Alzheimer's disease Mother   . Diabetes Sister 468 . Heart failure Sister   . Diabetes Sister 436 . Lung cancer Father   . Breast cancer Sister   . Cancer Sister        mouth  . Anesthesia problems Neg Hx   . Hypotension Neg Hx   . Malignant hyperthermia Neg Hx   . Pseudochol deficiency Neg Hx  Social History:  reports that she quit smoking about 14 months ago. Her smoking use included cigarettes. She has a 17.50 pack-year smoking history. She has never used smokeless tobacco. She reports current alcohol use of about  1.0 standard drink of alcohol per week. She reports that she does not use drugs.  Allergies:  Allergies  Allergen Reactions  . Atorvastatin Nausea And Vomiting and Other (See Comments)    "legs cramped; moderate to almost severe"- also  . Ibuprofen Other (See Comments)    Per pt, MD doesn't want pt to take this   . Metformin And Related Nausea And Vomiting  . Ace Inhibitors Cough and Nausea And Vomiting  . Hydrocodone-Acetaminophen Nausea And Vomiting  . Albuterol Other (See Comments)    Caused SVT, but pt is currently using it as needed    Medications: Prior to Admission medications   Medication Sig Start Date End Date Taking? Authorizing Provider  acetaminophen (TYLENOL) 325 MG tablet Take 2 tablets (650 mg total) by mouth every 6 (six) hours as needed for mild pain. Patient taking differently: Take 650 mg by mouth every 6 (six) hours as needed for mild pain (or headaches).  06/30/18  Yes Luetta Nutting, DO  albuterol (VENTOLIN HFA) 108 (90 Base) MCG/ACT inhaler TAKE 2 PUFFS BY MOUTH EVERY 6 HOURS AS NEEDED FOR WHEEZE OR SHORTNESS OF BREATH Patient taking differently: Inhale 2 puffs into the lungs every 6 (six) hours as needed for wheezing or shortness of breath.  12/19/19  Yes Luetta Nutting, DO  allopurinol (ZYLOPRIM) 100 MG tablet Take 100 mg by mouth daily as needed (for gout flares).  06/24/18  Yes [provider]  apixaban (ELIQUIS) 5 MG TABS tablet Take 1 tablet (5 mg total) by mouth 2 (two) times daily. 06/20/20  Yes Evans Lance, MD  flecainide (TAMBOCOR) 50 MG tablet Take 1 tablet (50 mg total) by mouth 2 (two) times daily. 06/20/20  Yes Evans Lance, MD  FLUoxetine (PROZAC) 20 MG capsule Take 1 capsule (20 mg total) by mouth at bedtime. 04/13/20  Yes Nche, Charlene Brooke, NP  gabapentin (NEURONTIN) 100 MG capsule Take 200 mg by mouth at bedtime.  10/27/20  Yes [provider]  hydrALAZINE (APRESOLINE) 50 MG tablet TAKE 1 TABLET BY MOUTH EVERY 8  HOURS. Patient taking differently: Take 50 mg by mouth every 8 (eight) hours.  10/08/20  Yes Evans Lance, MD  metolazone (ZAROXOLYN) 2.5 MG tablet Take one tablet ONCE a week on Wednesday ONLY. Patient taking differently: Take 2.5 mg by mouth every Saturday.  08/08/20  Yes Evans Lance, MD  nitroGLYCERIN (NITROSTAT) 0.4 MG SL tablet Place 1 tablet (0.4 mg total) under the tongue every 5 (five) minutes as needed. Patient taking differently: Place 0.4 mg under the tongue every 5 (five) minutes as needed for chest pain.  07/27/17  Yes Weaver, Scott T, PA-C  omeprazole (PRILOSEC) 20 MG capsule Take 1 capsule (20 mg total) by mouth daily. 04/24/20  Yes Nche, Charlene Brooke, NP  OXYGEN Inhale 3-4 L/min into the lungs continuous.   Yes [provider]  potassium chloride (KLOR-CON) 10 MEQ tablet Take 2 tablets by mouth ONCE a week on Wednesday ONLY. Patient taking differently: Take 20 mEq by mouth every Saturday.  08/08/20  Yes Evans Lance, MD  PRESCRIPTION MEDICATION Apply 1 patch topically See admin instructions. Zio patch- As directed   Yes [provider]  rosuvastatin (CRESTOR) 40 MG tablet Take 1 tablet (40 mg  total) by mouth at bedtime. 10/16/20  Yes Nche, Charlene Brooke, NP  torsemide (DEMADEX) 20 MG tablet Take 2 tablets (40 mg total) by mouth daily. Patient taking differently: Take 60 mg by mouth daily.  05/03/20  Yes Simmons, Brittainy M, PA-C  Verapamil HCl CR 300 MG CP24 TAKE 1 CAPSULE (300 MG TOTAL) BY MOUTH DAILY. PLEASE CALL TO SCHEDULE OVERDUE APPT WITH DR ROSS Patient taking differently: Take 300 mg by mouth daily.  04/04/20  Yes Nche, Charlene Brooke, NP  Blood Glucose Monitoring Suppl (ONETOUCH VERIO) w/Device KIT USE TO CHECK BLOOD GLUCOSE 4 TIMES A DAY . DIAGNOSIS: TYPE 2 DIABETES E11.59 11/09/19   Luetta Nutting, DO  gabapentin (NEURONTIN) 400 MG capsule Take 1 capsule (400 mg total) by mouth at bedtime. Patient not taking: Reported on 11/01/2020 04/24/20   Nche,  Charlene Brooke, NP  glucose blood (ONETOUCH VERIO) test strip For E11.22.  Use to check glucose qid 11/09/19   Luetta Nutting, DO  isosorbide mononitrate (IMDUR) 60 MG 24 hr tablet TAKE 1 TABLET (60 MG TOTAL) BY MOUTH DAILY FOR 30 DAYS. ** DO NOT CRUSH ** Patient taking differently: Take 60 mg by mouth daily.  06/12/20   Nche, Charlene Brooke, NP    I have reviewed the patient's current and reported prior to admission medications.  Labs:  BMP Latest Ref Rng & Units 10/31/2020 10/11/2020 10/27/2020  Glucose 70 - 99 mg/dL - 168(H) 121(H)  BUN 8 - 23 mg/dL - 55(H) 53(H)  Creatinine 0.44 - 1.00 mg/dL - 4.81(H) 4.87(H)  BUN/Creat Ratio 12 - 28 - - -  Sodium 135 - 145 mmol/L 139 137 137  Potassium 3.5 - 5.1 mmol/L 4.0 4.2 4.3  Chloride 98 - 111 mmol/L - 98 95(L)  CO2 22 - 32 mmol/L - 23 27  Calcium 8.9 - 10.3 mg/dL - 7.8(L) 7.9(L)    Urinalysis    Component Value Date/Time   COLORURINE AMBER (A) 10/31/2020 2032   APPEARANCEUR CLOUDY (A) 10/18/2020 2032   LABSPEC 1.013 10/17/2020 2032   PHURINE 5.0 10/10/2020 2032   GLUCOSEU NEGATIVE 10/26/2020 2032   HGBUR SMALL (A) 11/03/2020 2032   BILIRUBINUR NEGATIVE 10/24/2020 2032   BILIRUBINUR 1+ 02/07/2019 1433   KETONESUR NEGATIVE 11/01/2020 2032   PROTEINUR 100 (A) 11/04/2020 2032   UROBILINOGEN 0.2 02/07/2019 1433   UROBILINOGEN 0.2 07/07/2013 1353   NITRITE NEGATIVE 10/09/2020 2032   LEUKOCYTESUR LARGE (A) 10/19/2020 2032     ROS:  Pertinent items noted in HPI and remainder of comprehensive ROS otherwise negative.  Note some limitation as patient is reportedly postictal  Physical Exam: Vitals:   10/31/20 1000 10/31/20 1100  BP: (!) 106/56 125/79  Pulse: 89 91  Resp: 17 (!) 25  Temp:    SpO2: 92% (!) 89%     General: adult female in bed obese habitus chronically ill-appearing HEENT: Normocephalic atraumatic Eyes: Sclera anicteric she has some trouble keeping her eyes open Neck: Increased neck circumference trachea  midline Heart: S1-S2 no rub appreciated Lungs: Reduced breath sounds on auscultation; on 4 liters oxygen; unlabored at rest and increased work of breathing with exertion  Abdomen: softly distended obese habitus nontender; red bowel sounds Extremities: 2+ edema bilateral lower extremities; no cyanosis  Skin: no rash on extremities exposed Neuro: Alert and oriented to person, date, and location.  She asks family to help with some details GU Foley catheter in place  Assessment/Plan:  # Acute kidney injury  - Secondary to cardiorenal syndrome - Initiate CRRT  to optimize volume and for clearance.  To start, keep even x 2 hours then transition to 50-100 ml/hr off as tolerated - I have spoken with critical care regarding line placement.  They are coordinating and think they may have to intubate  - She is a somewhat poor long-term candidate for dialysis with her functional status, cardiac history, and history of noncompliance; she states that she would not want long term but does want to treatment of her acute renal failure.  She does not want to transition to supportive kidney measures/palliative care at this time - Her sister is her closest relative and appears to have the patient's best interests at heart - Update renal panel  # Chronic kidney disease stage IV  - Secondary to diabetes, hypertension, as well as cardiorenal syndrome - baseline creatinine is 2 - She may have had CKD progression over time with her illness  # Altered mental status - Secondary to post ictal state as well as multiple medications including gabapentin which have been discontinued.  Also in the setting of renal failure  # Acute on chronic combined systolic and diastolic CHF - Optimize volume with CRRT - on milrinone per cardiology/CHF  # Chronic hypoxic respiratory failure  - Continue supplemental oxygen and optimize volume status as able with RRT  # Seizure - neurology has been consulted  Thank you for the  consult.  Please do not hesitate to contact me with any questions   Claudia Desanctis 10/31/2020, 1:52 PM

## 2020-10-31 NOTE — Consult Note (Addendum)
Advanced Heart Failure Team Consult Note   Primary Physician: Nche, Charlene Brooke, NP PCP-Cardiologist:  Dorris Carnes, MD  [facility]: Dr. Haroldine Laws   Reason for Consultation: Acute on Chronic Biventricular Heart Failure   HPI:    [name] is seen today for evaluation of acute on chronic biventricular heart failure at the request of Dr. Sallyanne Kuster, Cardiology.   [name] a 64 y.o.femalewith a hx ofmorbid obesity,chronic systolic/diastolic CHF, PSVT, prior CVA, HTN, DM, OSA,CKD with baseline creatinine 1.3-1.4,COPDwith ongoing tobacco, pulmonary hypertensionand CSF rhinorrhea s/p skull base reconstruction.   Admitted with A/C diastolic heart failure. > 30 poundweight gain in a few weeks after her  PCP discontinued lasix 06/27/2019 secondary to worsening kidney function. In ER markedly volume overloaded. [name] was placed to guide therapy with initial swan numbers showing cardiac index 1.6 and elevated filling pressures. ECHO completed and showed biventricular HF with LVEF 25-30 and RV severely reduced. Placed on milrinone and diuresed with IV lasix. As she improved milrinone was weaned off. HF medications adjusted. Discharge weight was 236 pounds.   Echo 1/21 showed  EF 45-50% with mod RV dysfunction. Grade 2 DD. MV is thickened with mild MR/MS  Zio patch placed 1/21 for palpitations and showed frequent runs of SVT. Was referred to Dr. Lovena Le w/ EP. Underwent EP study 6/21 and AF was induced. Now on Flecainide  Admitted 4/21 for ADHF. Hospital course c/b AKI. SCr spiked to 3.3. [name] was discontinued.  SCr improved, down to 1.78 day of discharge.  Had recent [facility] f/u on 10/23/20. Was massively fluid overloaded in the setting of dietary noncompliance. ReDs Clip was elevated at 45%. She was enrolled w/ [facility] for daily IV lasix. Developed worsening renal function at home (labs monitored by [facility]). SCr had bumped from  2.55>>4.1 over the course of several days w/ decrease in UOP.  Also w/ increased dyspnea and wheezing. Was subsequently sent to [facility].   On arrival to ED, SCr was elevated at 4.8. K 4.3. CXR showed cardiomegaly and bilateral pulmonary congestion. O2 sats stable. BNP 468. COVID negative. UA+ for UTI. AKI on CKD felt primarily due to worsening intrinsic kidney disease, however concern for possible low output HF contributing. Plan was for placement of CVC to  check Co-ox and follow CVPs. Not done yet.   Got 2 doses of high dose IV Lasix 160 mg. Oliguric, only 375 cc in UOP overnight. No repeat BMP yet today.   She appears confused and agitated today. O2 sats 94%. NSR on tele.    Cardiac Testing Echo9/10/20 EF 25-30% Mod LVH. RV dilated and severely HK.Personally reviewed Echo 1/21 EF 45-50% with mod RV dysfunction. Grade 2 DD. MV is thickened with mild MR/MS  RHC/LHC 07/31/2019  Cardiac catheterizationdemonstrated minor nonobstructive CAD, mildly elevated LVEDP and moderate pulmonary hypertension. LAD 35% OM2 25% RA 10 PA 57/22 (39) PCWP 19 LVEDP 21 Fick 3.7/1.7   Review of Systems: [y] = yes, _0  = no   . General: Weight gain [ Y]; Weight loss _1 ; Anorexia _2 ; Fatigue _3 ; Fever _4 ; Chills _5 ; Weakness _6   . Cardiac: Chest pain/pressure _7 ; Resting SOB [Y ]; Exertional SOB [ Y]; Orthopnea _8 ; Pedal Edema [Y ]; Palpitations _9 ; Syncope _10 ; Presyncope _11 ; Paroxysmal nocturnal dyspnea_12   . Pulmonary: Cough _13 ; Wheezing[ Y]; Hemoptysis_14 ; Sputum _15 ; Snoring _16   . GI: Vomiting_17 ;  Dysphagia_0 ; Melena_1 ; Hematochezia _2 ; Heartburn_3 ; Abdominal pain _4 ; Constipation _5 ; Diarrhea _6 ; BRBPR _7   . GU: Hematuria_8 ; Dysuria _9 ; Nocturia_10   . Vascular: Pain in legs with walking _11 ; Pain in feet with lying flat _12 ; Non-healing sores _13 ; Stroke _14 ; TIA _15 ; Slurred speech _16 ;  . Neuro: Headaches_17 ; Vertigo_18 ; Seizures_19 ; Paresthesias_20 ;Blurred vision [Y ];  Diplopia _21 ; Vision changes _22   . Ortho/Skin: Arthritis _23 ; Joint pain _24 ; Muscle pain _25 ; Joint swelling _26 ; Back Pain _27 ; Rash _28   . Psych: Depression_29 ; Anxiety_30   . Heme: Bleeding problems _31 ; Clotting disorders _32 ; Anemia _33   . Endocrine: Diabetes _34 ; Thyroid dysfunction_35   Home Medications Prior to Admission medications   Medication Sig Start Date End Date Taking? Authorizing Provider  acetaminophen (TYLENOL) 325 MG tablet Take 2 tablets (650 mg total) by mouth every 6 (six) hours as needed for mild pain. Patient taking differently: Take 650 mg by mouth every 6 (six) hours as needed for mild pain (or headaches).  06/30/18  Yes Luetta Nutting, DO  albuterol (VENTOLIN HFA) 108 (90 Base) MCG/ACT inhaler TAKE 2 PUFFS BY MOUTH EVERY 6 HOURS AS NEEDED FOR WHEEZE OR SHORTNESS OF BREATH Patient taking differently: Inhale 2 puffs into the lungs every 6 (six) hours as needed for wheezing or shortness of breath.  12/19/19  Yes Luetta Nutting, DO  allopurinol (ZYLOPRIM) 100 MG tablet Take 100 mg by mouth daily as needed (for gout flares).  06/24/18  Yes [provider]  apixaban (ELIQUIS) 5 MG TABS tablet Take 1 tablet (5 mg total) by mouth 2 (two) times daily. 06/20/20  Yes Evans Lance, MD  flecainide (TAMBOCOR) 50 MG tablet Take 1 tablet (50 mg total) by mouth 2 (two) times daily. 06/20/20  Yes Evans Lance, MD  FLUoxetine (PROZAC) 20 MG capsule Take 1 capsule (20 mg total) by mouth at bedtime. 04/13/20  Yes Nche, Charlene Brooke, NP  gabapentin (NEURONTIN) 100 MG capsule Take 200 mg by mouth at bedtime.  10/27/20  Yes [provider]  hydrALAZINE (APRESOLINE) 50 MG tablet TAKE 1 TABLET BY MOUTH EVERY 8 HOURS. Patient taking differently: Take 50 mg by mouth every 8 (eight) hours.  10/08/20  Yes Evans Lance, MD  metolazone (ZAROXOLYN) 2.5 MG tablet Take one tablet ONCE a week on Wednesday ONLY. Patient taking differently: Take 2.5 mg by mouth every Saturday.  08/08/20   Yes Evans Lance, MD  nitroGLYCERIN (NITROSTAT) 0.4 MG SL tablet Place 1 tablet (0.4 mg total) under the tongue every 5 (five) minutes as needed. Patient taking differently: Place 0.4 mg under the tongue every 5 (five) minutes as needed for chest pain.  07/27/17  Yes Weaver, Scott T, PA-C  omeprazole (PRILOSEC) 20 MG capsule Take 1 capsule (20 mg total) by mouth daily. 04/24/20  Yes Nche, Charlene Brooke, NP  OXYGEN Inhale 3-4 L/min into the lungs continuous.   Yes [provider]  potassium chloride (KLOR-CON) 10 MEQ tablet Take 2 tablets by mouth ONCE a week on Wednesday ONLY. Patient taking differently: Take 20 mEq by mouth every Saturday.  08/08/20  Yes Evans Lance, MD  PRESCRIPTION MEDICATION Apply 1 patch topically See admin instructions. Zio patch- As directed   Yes [provider]  rosuvastatin (CRESTOR) 40 MG tablet Take 1 tablet (40 mg total)  by mouth at bedtime. 10/16/20  Yes Nche, Charlene Brooke, NP  torsemide (DEMADEX) 20 MG tablet Take 2 tablets (40 mg total) by mouth daily. Patient taking differently: Take 60 mg by mouth daily.  05/03/20  Yes Simmons, Brittainy M, PA-C  Verapamil HCl CR 300 MG CP24 TAKE 1 CAPSULE (300 MG TOTAL) BY MOUTH DAILY. PLEASE CALL TO SCHEDULE OVERDUE APPT WITH DR ROSS Patient taking differently: Take 300 mg by mouth daily.  04/04/20  Yes Nche, Charlene Brooke, NP  Blood Glucose Monitoring Suppl (ONETOUCH VERIO) w/Device KIT USE TO CHECK BLOOD GLUCOSE 4 TIMES A DAY . DIAGNOSIS: TYPE 2 DIABETES E11.59 11/09/19   Luetta Nutting, DO  gabapentin (NEURONTIN) 400 MG capsule Take 1 capsule (400 mg total) by mouth at bedtime. Patient not taking: Reported on 10/10/2020 04/24/20   Nche, Charlene Brooke, NP  glucose blood (ONETOUCH VERIO) test strip For E11.22.  Use to check glucose qid 11/09/19   Luetta Nutting, DO  isosorbide mononitrate (IMDUR) 60 MG 24 hr tablet TAKE 1 TABLET (60 MG TOTAL) BY MOUTH DAILY FOR 30 DAYS. ** DO NOT CRUSH ** Patient taking  differently: Take 60 mg by mouth daily.  06/12/20   Nche, Charlene Brooke, NP  topiramate (TOPAMAX) 25 MG tablet TAKE 1 TABLET BY MOUTH TWICE A DAY Patient not taking: Reported on 11/05/2020 01/02/20   Luetta Nutting, DO    Past Medical History: Past Medical History:  Diagnosis Date  . Anxiety    doesn't take any meds for this  . Arteriosclerosis of coronary artery 08/31/2011   Myoview 9/12: EF 44, no ischemia // Myoview 8/18: EF 57, low risk, possible small area of inferior ischemia // Minor nonobstructive CAD by cardiac catheterization - LHC 8/18: Proximal LAD 35, OM2 25  . Arthritis    knuckles  . Asthma   . Carotid artery disease ([place])    Carotid US 7/14: Bilateral ICA 40-59 // Carotid US ([facility]) 2/17: bilat plaque without sig stenosis  . Chronic diastolic CHF (congestive heart failure) ([place]) 07/26/2017   Echo 7/18:Severe conc LVH, EF 60-65, no RWMA, Gr 2 DD, MAC, mild MR // right and left heart cath 8/18: LVEDP 21  . Claustrophobia 07/26/2012  . COPD (chronic obstructive pulmonary disease) ([place])    "chronic bronchitis - about every winter"  . CSF leak    dizziness/headache - assoc symptoms // 2/2 encephalocele s/p skull base reconstruction  . Diverticulosis   . Dyspnea   . History of CVA (cerebrovascular accident) 03/2004   denies residual 07/26/2012  . Hx of gout   . Hyperlipidemia    takes Pravastatin daily  . Hypertensive heart disease with chronic diastolic congestive heart failure ([place]) 07/26/2017  . Internal hemorrhoids   . Kidney stones   . LVH (left ventricular hypertrophy)    Echo 8/18: Severe conc LVH, EF 60-65, no RWMA, Gr 2 DD, MAC, mild MR  . OSA (obstructive sleep apnea) 07/26/2012   prior CPAP - stopped due to being primary caregiver for dying parent (stopped in 2016)  . PSVT (paroxysmal supraventricular tachycardia) ([place])   . Pulmonary hypertension, unspecified ([place]) 08/14/2017   cath 07/2017 >>PASP 62m Hg, PA mean 39 mm Hg  . Type II diabetes mellitus [place]      Past Surgical History: Past Surgical History:  Procedure Laterality Date  . ESOPHAGOGASTRODUODENOSCOPY (EGD) WITH PROPOFOL N/A 07/13/2015   Procedure: ESOPHAGOGASTRODUODENOSCOPY (EGD) WITH PROPOFOL;  Surgeon: PCarol Ada MD;  Location: [facility];  Service: Endoscopy;  Laterality: N/A;  .  NASAL SINUS SURGERY  01/29/2012   Procedure: ENDOSCOPIC SINUS SURGERY WITH STEALTH;  Surgeon: Ruby Cola, MD;  Location: [place];  Service: ENT;  Laterality: N/A;  . NM MYOVIEW LTD  08/25/2011   Low risk study, no evidence of ischemia, EF 44%  . REFRACTIVE SURGERY  ~ 2010   left  . RIGHT HEART CATH N/A 06/29/2020   Procedure: RIGHT HEART CATH;  Surgeon: Jolaine Artist, MD;  Location: [place] [facility];  Service: Cardiovascular;  Laterality: N/A;  . RIGHT/LEFT HEART CATH AND CORONARY ANGIOGRAPHY N/A 07/30/2017   Procedure: RIGHT/LEFT HEART CATH AND CORONARY ANGIOGRAPHY;  Surgeon: Martinique, Peter M, MD;  Location: [place] [facility];  Service: Cardiovascular;  Laterality: N/A;  . SPHENOIDECTOMY  01/29/2012   Procedure: SPHENOIDECTOMY;  Surgeon: Ruby Cola, MD;  Location: [place];  Service: ENT;  Laterality: Left;  REPAIR OF LEFT SPHENOID    . SVT ABLATION N/A 05/21/2020   Procedure: SVT ABLATION;  Surgeon: Evans Lance, MD;  Location: [place] [facility];  Service: Cardiovascular;  Laterality: N/A;  . TOTAL ABDOMINAL HYSTERECTOMY  1994    Family History: Family History  Problem Relation Age of Onset  . Colon cancer Sister 8  . Breast cancer Mother 10  . Alzheimer's disease Mother   . Diabetes Sister 23  . Heart failure Sister   . Diabetes Sister 74  . Lung cancer Father   . Breast cancer Sister   . Cancer Sister        mouth  . Anesthesia problems Neg Hx   . Hypotension Neg Hx   . Malignant hyperthermia Neg Hx   . Pseudochol deficiency Neg Hx     Social History: Social History   Socioeconomic History  . Marital status: Single    Spouse name: Not on file  . Number of children:  0  . Years of education: Not on file  . Highest education level: Not on file  Occupational History  . Occupation: disabled  Tobacco Use  . Smoking status: Former Smoker    Packs/day: 0.50    Years: 35.00    Pack years: 17.50    Types: Cigarettes    Quit date: 08/16/2019    Years since quitting: 1.2  . Smokeless tobacco: Never Used  Vaping Use  . Vaping Use: Never used  Substance and Sexual Activity  . Alcohol use: Yes    Alcohol/week: 1.0 standard drink    Types: 1 Glasses of wine per week    Comment: rarely  . Drug use: No  . Sexual activity: Not Currently    Birth control/protection: Surgical  Other Topics Concern  . Not on file  Social History Narrative   Currently unemployed.   No children, not married   Social Determinants of Radio broadcast assistant Strain:   . Difficulty of Paying Living Expenses: Not on file  Food Insecurity:   . Worried About Charity fundraiser in the Last Year: Not on file  . Ran Out of Food in the Last Year: Not on file  Transportation Needs:   . Lack of Transportation (Medical): Not on file  . Lack of Transportation (Non-Medical): Not on file  Physical Activity:   . Days of Exercise per Week: Not on file  . Minutes of Exercise per Session: Not on file  Stress:   . Feeling of Stress : Not on file  Social Connections:   . Frequency of Communication with Friends and Family: Not on  file  . Frequency of Social Gatherings with Friends and Family: Not on file  . Attends Religious Services: Not on file  . Active Member of Clubs or Organizations: Not on file  . Attends Archivist Meetings: Not on file  . Marital Status: Not on file    Allergies:  Allergies  Allergen Reactions  . Atorvastatin Nausea And Vomiting and Other (See Comments)    "legs cramped; moderate to almost severe"- also  . Ibuprofen Other (See Comments)    Per pt, MD doesn't want pt to take this   . Metformin And Related Nausea And Vomiting  . Ace Inhibitors  Cough and Nausea And Vomiting  . Hydrocodone-Acetaminophen Nausea And Vomiting  . Albuterol Other (See Comments)    Caused SVT, but pt is currently using it as needed    Objective:    Vital Signs:   Temp:  [97.4 F (36.3 C)-97.9 F (36.6 C)] 97.5 F (36.4 C) (11/24 0645) Pulse Rate:  [58-83] 83 (11/24 0600) Resp:  [15-22] 19 (11/24 0600) BP: (104-133)/(55-87) 119/64 (11/24 0600) SpO2:  [92 %-100 %] 93 % (11/24 0600) Weight:  [115.9 kg-118.8 kg] 115.9 kg (11/24 0333)    Weight change: Filed Weights   10/27/2020 1603 10/08/2020 2300 10/31/20 0333  Weight: 118.8 kg 116.2 kg 115.9 kg    Intake/Output:   Intake/Output Summary (Last 24 hours) at 10/31/2020 0725 Last data filed at 10/31/2020 3762 Gross per 24 hour  Intake 98.02 ml  Output 375 ml  Net -276.98 ml      Physical Exam    General: obese and confused. No resp difficulty HEENT: normal Neck: supple. Thick neck, JVD elevated. Carotids 2+ bilat; no bruits. No lymphadenopathy or thyromegaly appreciated. Cor: PMI nondisplaced. Regular rate & rhythm. No rubs, gallops or murmurs. Lungs: clear Abdomen: soft, nontender, nondistended. No hepatosplenomegaly. No bruits or masses. Good bowel sounds. Extremities: no cyanosis, clubbing, rash, 2+ bilateral LE edema Neuro: alert & orientedx3, cranial nerves grossly intact. moves all 4 extremities w/o difficulty. Affect pleasant   Telemetry   NSR 80s   EKG    Low voltage, NSR w/ 1st degree AVB 62 bpm   Labs   Basic Metabolic Panel: Recent Labs  Lab 10/27/2020 1611 10/16/2020 2328  NA 137 137  K 4.3 4.2  CL 95* 98  CO2 27 23  GLUCOSE 121* 168*  BUN 53* 55*  CREATININE 4.87* 4.81*  CALCIUM 7.9* 7.8*    Liver Function Tests: Recent Labs  Lab 10/12/2020 2146  AST 18  ALT 8  ALKPHOS 72  BILITOT 1.4*  PROT 7.2  ALBUMIN 3.9   No results for input(s): LIPASE, AMYLASE in the last 168 hours. No results for input(s): AMMONIA in the last 168 hours.  CBC: Recent Labs   Lab 11/04/2020 1611  WBC 7.1  HGB 11.9*  HCT 41.1  MCV 93.4  PLT 209    Cardiac Enzymes: No results for input(s): CKTOTAL, CKMB, CKMBINDEX, TROPONINI in the last 168 hours.  BNP: BNP (last 3 results) Recent Labs    04/16/20 2301 05/01/20 1300 10/09/2020 1628  BNP 1,641.1* 2,032.8* 468.3*    ProBNP (last 3 results) Recent Labs    12/14/19 1400  PROBNP 905.0*     CBG: Recent Labs  Lab 10/08/2020 2330 10/31/20 0642  GLUCAP 154* 141*    Coagulation Studies: No results for input(s): LABPROT, INR in the last 72 hours.   Imaging   US RENAL  Result Date: 10/28/2020 CLINICAL DATA:  64 year old female with acute renal insufficiency. EXAM: RENAL / URINARY TRACT ULTRASOUND COMPLETE COMPARISON:  None. FINDINGS: Evaluation is limited due to body habitus. Right Kidney: Renal measurements: 9.5 x 4.8 x 4.7 cm = volume: 112 mL. Normal echogenicity. No hydronephrosis or shadowing stone. Left Kidney: Renal measurements: 11.0 x 5.2 x 5.7 cm = volume: 170 mL. The left kidney is poorly visualized. No definite hydronephrosis or shadowing stone. Bladder: Not seen. Other: None. IMPRESSION: No hydronephrosis or shadowing stone. Electronically Signed   By: Anner Crete M.D.   On: 10/25/2020 20:57   DG Chest Port 1 View  Result Date: 10/31/2020 CLINICAL DATA:  Congestive heart failure EXAM: PORTABLE CHEST 1 VIEW COMPARISON:  10/15/2020 FINDINGS: Generator pack in the left axillary region. Shallow inspiration. Prominent cardiac enlargement. Perihilar infiltrates likely representing edema. No definite pleural effusion. Aortic calcification. Similar appearance to previous study. IMPRESSION: Cardiac enlargement with perihilar edema.  No significant change. Electronically Signed   By: Lucienne Capers M.D.   On: 10/31/2020 05:53   DG Chest Port 1 View  Result Date: 10/31/2020 CLINICAL DATA:  Shortness of breath EXAM: PORTABLE CHEST 1 VIEW COMPARISON:  09/12/2020 FINDINGS: Cardiomegaly with  vascular congestion and mild hazy interstitial opacities suspicious for pulmonary edema. Suspected left pleural effusion. Aortic atherosclerosis. No pneumothorax. IMPRESSION: Cardiomegaly with vascular congestion and mild hazy interstitial opacities suspicious for pulmonary edema. Suspect left pleural effusion. Electronically Signed   By: Donavan Foil M.D.   On: 10/20/2020 19:45      Medications:     Current Medications: . albuterol  1 puff Inhalation Q6H  . apixaban  2.5 mg Oral BID  . Chlorhexidine Gluconate Cloth  6 each Topical Daily  . FLUoxetine  20 mg Oral QHS  . gabapentin  300 mg Oral QHS  . hydrALAZINE  50 mg Oral Q8H  . isosorbide mononitrate  30 mg Oral Daily  . mouth rinse  15 mL Mouth Rinse BID  . pantoprazole  40 mg Oral Daily  . rosuvastatin  40 mg Oral QHS  . sodium chloride flush  3 mL Intravenous Q12H  . topiramate  25 mg Oral BID     Infusions: . sodium chloride    . milrinone 0.125 mcg/kg/min (10/31/20 6967)      Assessment/Plan   1. Acute on Chronic biventricular HF due to NICM - Echo 9/20 EF 25-30% (new). RV severely HK  - Cath 8/20 with minimal CAD - Echo 1/21  EF 45-50% with mod RV dysfunction. Grade 2 DD. MV is thickened with mild MR/MS - RHC 7/21 with mixed moderate pulmonary HTN - now admitted for a/c CHF w/ marked fluid overloaded in the setting of worsening CKD. Failed escalation of home IV diuretics. SCr now up to 4.8 (baseline 1.9-2.0). Oliguric.  - suspect she may need at least short term HD for fluid removal  - needs CVC (no PICC) to check Co-ox and follow CVPs. May need inotropic support if low output - repeat echo  - Hold Jardiance - No ARB/ARNi w/ CKD - May need to hold hydralazine and imdur for now to avoid hypotension to help w/ renal perfusion  - hold  blocker for suspected low output   2. AKI on CKD - baseline SCr ~1.9-20 - SCr up to 4.8 on admit, BUN 55 and oliguric. K 4.0 - consult renal. May need HD - check co-ox, if low  output start inotropes  - follow BMP    3. Chronic hypoxic respiratory failure - Follows with  Dr. Valeta Harms in Pulmonary - continue supp O2  - CPAP QHS     4. PAH, mild to moderate - likely who GROUP II & III  -  RHC 7/21 with mixed moderate pulmonary HTN - not candidate for selective pulmonary vasodilators - needs diuresis/ volume removal per above - Once better diuresed, would benefit from repeat RHC, PFTs and V/Q scan   5. COPD - Quit smoking since 08/16/19.  - followed by pulmonology   6. OSA - CPAP QHS   8. DM2 - recent hgb A1c 6.1  - management per primary team   9. Atrial Fibrillation  - in NSR currently  - Hold Eliquis>>transition to IV heparin in case of procedures  10. UTI + UA. Check Urine cultures and blood cultures  - abx per IM  - check CBC   11. Confusion/ AMS - d/w IM. Suspect metabolic encephalopathy from polypharmacy in setting of AKI. They are adjusting meds.  - check ABG - treat UTI w/ abx. Check blood cultures      Length of Stay: 1  Brittainy Simmons, PA-C  10/31/2020, 7:25 AM  Advanced Heart Failure Team Pager [phone] (M-F; 7a - 4p)  Please contact [facility] for night-coverage after hours (4p -7a ) and weekends on [email or web address]  Patient seen with PA, agree with the above note.   Patient was admitted with acute on chronic HF with mid range EF and RV failure (EF 45-50% on 1/21 echo).  She had been getting home IV Lasix but developed progressive volume overload and AKI on CKD stage 3.  She was empirically started on milrinone 0.125 and got Lasix 160 mg IV x 2 doses now with minimal response.  She was confused with mental status changes, head CT was negative.  She was intubated for low oxygen saturation and to control airway with altered MS.    Now has CVL, just placed.  Going to get HD catheter.  CCM and renal discussed with family, plan for CVVH to start today given minimal response to high dose IV Lasix and creatinine rising with  confusion.   General: Intubated/sedated.  Neck: Thick, JVP difficult but appears elevated, no thyromegaly or thyroid nodule.  Lungs: Decreased at bases.  CV: Nondisplaced PMI.  Heart regular S1/S2, no S3/S4, no murmur. 2+ edema to thighs.  No carotid bruit.  Difficult to palpate pedal pulses.  Abdomen: Soft, nontender, no hepatosplenomegaly, no distention.  Skin: Intact without lesions or rashes.  Neurologic: Alert and oriented x 3.  Psych: Normal affect. Extremities: No clubbing or cyanosis.  HEENT: Normal.   Acute on chronic HF with mid range EF and RV failure.  Suspect OHS/OSA plays a role in the RV failure.  She is volume overloaded on exam, complicated by cardiorenal syndrome with AKI on CKD stage 3. BP stable.  - Continue milrinone 0.125 for now, will send down co-ox now that has CVL.  - Follow CVP.  - Not responding to high dose IV Lasix, will start CVVH with confusion/rising creatinine/marked volume overload.  - Needs repeat echo (last in 1/21).   AKI on CKD stage 3.  Suspect cardiorenal component.  Renal following, as above plan CVVH.   PAF, NSR currently.  Will be on heparin gtt for now (Eliquis on hold).   Delirium pre-intubation.  Suspect toxic-metabolic encephopathy.  - CVVH for any component of uremai.  - Plan CT head.   Loralie Champagne 10/31/2020 2:19 PM

## 2020-10-31 NOTE — Progress Notes (Signed)
Patient is being transferred to the ICU team- Dr. Thompson Caul help is greatly appreciated.  TR[facility] happy to pick back up once out of ICU. Angel Bear DO

## 2020-10-31 NOTE — Progress Notes (Addendum)
Progress Note    [name]  YKZ:[id number] DOB: 03-18-1956  DOA: 10/16/2020 PCP: Angel Buffy, NP    Brief Narrative:     Medical records reviewed and are as summarized below:  [name] is an 64 y.o. female with medical history significant of chronic combined systolic and diastolic CHF, chronic hypoxic respite failure on 3 L continuously, CKD stage III, HTN, paroxysmal A. fib, presented with worsening of kidney function.  On 11/7, patient was started on IV Lasix for decompensated CHF.   Assessment/Plan:   Active Problems:   Morbid obesity due to excess calories (HCC)   Type 2 diabetes mellitus with diabetic polyneuropathy (HCC)   OSA (obstructive sleep apnea)   Pulmonary hypertension, unspecified (HCC)   Type 2 diabetes mellitus with stage 3 chronic kidney disease, with long-term current use of insulin (HCC)   COPD (chronic obstructive pulmonary disease) (HCC)   Acute on chronic combined systolic and diastolic CHF (congestive heart failure) (HCC)   Paroxysmal atrial fibrillation (HCC)   AKI (acute kidney injury) ([place])   CHF (congestive heart failure) ([place])  ADDENDUM: called by nursing to report seizure like activity at 11:20 -EEG ordered -ativan for > 3 min. -neurology consult -STAT CT head to r/o beed  Acute on chronic combined systolic and diastolic CHF decompensation -CHF team consultation appreciated -milrinone -? Cardiorenal -needs central line for co-ox and CVPs -echo pending  DM type 2 -controlled -SSI while in hospital  UTI (pateint c/o burning with urination) -culture pending -rocephin IV  AKI on CKD -baseline around 2 -nephrology consult: follows with [name] as an outpatient   Paroxysmal A. Fib -eliquis to heparin while in hospital  HTN -Hydralazine, Imdur  HLD -Crestor  Severe obesity Body mass index is 46.73 kg/m.  OSA -non-compliance with CPAP- will do trial here -has outpatient pulm follow  up  AMS plus involuntary movements -d/c Neurontin  -not on topamax any longer -monitor closely -should improved with improved kidney function and stoppage of offending meds  PAH, mild to moderate Per cards:  - likely who GROUP II & III - RHC 7/21 with mixed moderate pulmonary HTN - not candidate for selective pulmonary vasodilators -needs diuresis/ volume removal per above - Once better diuresed, would benefit from repeat RHC, PFTs and V/Q scan    Miscellaneous: -NOT ON TOPAMAX ANYMORE-- have d/c'd: from chart review in 2013: neurology: hemicranial headache after repair of longstanding CSF leak. Headache with characteristics often seen in pseudotumor cerebri, although discs are sharp. Does have some migrainous features as well. She may be developing pseudotumor cerebri. Sleep apnea may also be contributing. Differential includes hemicrania continua, chronic migraine.   Family Communication/Anticipated D/C date and plan/Code Status   DVT prophylaxis: heparin. Code Status: Full Code.  Disposition Plan: Status is: Inpatient  Remains inpatient appropriate because:Inpatient level of care appropriate due to severity of illness   Dispo: The patient is from: Home              Anticipated d/c is to: tbd              Anticipated d/c date is: > 3 days              Patient currently is not medically stable to d/c.         Medical Consultants:    Nephrology  CHF team    Subjective:   Started having involuntary movements last PM into this AM  Objective:  Vitals:   10/31/20 0645 10/31/20 0700 10/31/20 0800 10/31/20 0900  BP:  109/62  (!) 116/57  Pulse:  85 86 89  Resp:  13 (!) 22 (!) 22  Temp: (!) 97.5 F (36.4 C)     TempSrc: Oral     SpO2:  90% 93% 92%  Weight:      Height:        Intake/Output Summary (Last 24 hours) at 10/31/2020 1119 Last data filed at 10/31/2020 0900 Gross per 24 hour  Intake 123.81 ml  Output 495 ml  Net -371.19 ml   Filed  Weights   10/28/2020 1603 11/04/2020 2300 10/31/20 0333  Weight: 118.8 kg 116.2 kg 115.9 kg    Exam:  General: Appearance:    Severely obese female who appears ill, involuntary movements when awoken     Lungs:     [place], diminished, respirations unlabored  Heart:    Normal heart rate.  MS:   All extremities are intact. + LE edema  Neurologic:   Awake, alert, - seems to be hard of hearing vs confused    Data Reviewed:   I have personally reviewed following labs and imaging studies:  Labs: Labs show the following:   Basic Metabolic Panel: Recent Labs  Lab 10/24/2020 1611 10/12/2020 1611 10/09/2020 2328 10/31/20 0741  NA 137  --  137 139  K 4.3   < > 4.2 4.0  CL 95*  --  98  --   CO2 27  --  23  --   GLUCOSE 121*  --  168*  --   BUN 53*  --  55*  --   CREATININE 4.87*  --  4.81*  --   CALCIUM 7.9*  --  7.8*  --    < > = values in this interval not displayed.   GFR Estimated Creatinine Clearance: 14.4 mL/min (A) (by C-G formula based on SCr of 4.81 mg/dL (H)). Liver Function Tests: Recent Labs  Lab 10/21/2020 2146  AST 18  ALT 8  ALKPHOS 72  BILITOT 1.4*  PROT 7.2  ALBUMIN 3.9   No results for input(s): LIPASE, AMYLASE in the last 168 hours. No results for input(s): AMMONIA in the last 168 hours. Coagulation profile No results for input(s): INR, PROTIME in the last 168 hours.  CBC: Recent Labs  Lab 11/05/2020 1611 10/31/20 0741  WBC 7.1  --   HGB 11.9* 12.2  HCT 41.1 36.0  MCV 93.4  --   PLT 209  --    Cardiac Enzymes: No results for input(s): CKTOTAL, CKMB, CKMBINDEX, TROPONINI in the last 168 hours. BNP (last 3 results) Recent Labs    12/14/19 1400  PROBNP 905.0*   CBG: Recent Labs  Lab 11/05/2020 2330 10/31/20 0642  GLUCAP 154* 141*   D-Dimer: No results for input(s): DDIMER in the last 72 hours. Hgb A1c: No results for input(s): HGBA1C in the last 72 hours. Lipid Profile: No results for input(s): CHOL, HDL, LDLCALC, TRIG, CHOLHDL, LDLDIRECT in  the last 72 hours. Thyroid function studies: Recent Labs    10/25/2020 2328  TSH 5.691*   Anemia work up: No results for input(s): VITAMINB12, FOLATE, FERRITIN, TIBC, IRON, RETICCTPCT in the last 72 hours. Sepsis Labs: Recent Labs  Lab 10/29/2020 1611  WBC 7.1    Microbiology Recent Results (from the past 240 hour(s))  Respiratory Panel by RT PCR (Flu A&B, Covid) - Nasopharyngeal Swab     Status: None   Collection Time: 11/06/2020  6:20 PM   Specimen: Nasopharyngeal Swab; Nasopharyngeal(NP) swabs in vial transport medium  Result Value Ref Range Status   SARS Coronavirus 2 by RT PCR NEGATIVE NEGATIVE Final    Comment: (NOTE) SARS-CoV-2 target nucleic acids are NOT DETECTED.  The SARS-CoV-2 RNA is generally detectable in upper respiratoy specimens during the acute phase of infection. The lowest concentration of SARS-CoV-2 viral copies this assay can detect is 131 copies/mL. A negative result does not preclude SARS-Cov-2 infection and should not be used as the sole basis for treatment or other patient management decisions. A negative result may occur with  improper specimen collection/handling, submission of specimen other than nasopharyngeal swab, presence of viral mutation(s) within the areas targeted by this assay, and inadequate number of viral copies (<131 copies/mL). A negative result must be combined with clinical observations, patient history, and epidemiological information. The expected result is Negative.  Fact Sheet for Patients:  PinkCheek.be  Fact Sheet for Healthcare Providers:  GravelBags.it  This test is no t yet approved or cleared by the [place] FDA and  has been authorized for detection and/or diagnosis of SARS-CoV-2 by FDA under an Emergency Use Authorization (EUA). This EUA will remain  in effect (meaning this test can be used) for the duration of the COVID-19 declaration under Section  564(b)(1) of the Act, 21 U.S.C. section 360bbb-3(b)(1), unless the authorization is terminated or revoked sooner.     Influenza A by PCR NEGATIVE NEGATIVE Final   Influenza B by PCR NEGATIVE NEGATIVE Final    Comment: (NOTE) The Xpert Xpress SARS-CoV-2/FLU/RSV assay is intended as an aid in  the diagnosis of influenza from Nasopharyngeal swab specimens and  should not be used as a sole basis for treatment. Nasal washings and  aspirates are unacceptable for Xpert Xpress SARS-CoV-2/FLU/RSV  testing.  Fact Sheet for Patients: PinkCheek.be  Fact Sheet for Healthcare Providers: GravelBags.it  This test is not yet approved or cleared by the [place] FDA and  has been authorized for detection and/or diagnosis of SARS-CoV-2 by  FDA under an Emergency Use Authorization (EUA). This EUA will remain  in effect (meaning this test can be used) for the duration of the  Covid-19 declaration under Section 564(b)(1) of the Act, 21  U.S.C. section 360bbb-3(b)(1), unless the authorization is  terminated or revoked. Performed at [facility], [place] [address] [place], [place] [address]   MRSA PCR Screening     Status: None   Collection Time: 11/06/2020 11:17 PM   Specimen: Nasopharyngeal  Result Value Ref Range Status   MRSA by PCR NEGATIVE NEGATIVE Final    Comment:        The GeneXpert MRSA Assay (FDA approved for NASAL specimens only), is one component of a comprehensive MRSA colonization surveillance program. It is not intended to diagnose MRSA infection nor to guide or monitor treatment for MRSA infections. Performed at [facility], [place] [address], [place], [place] [address]     Procedures and diagnostic studies:  US RENAL  Result Date: 2020-11-06 CLINICAL DATA:  64 year old female with acute renal insufficiency. EXAM: RENAL / URINARY TRACT ULTRASOUND COMPLETE COMPARISON:  None. FINDINGS: Evaluation is  limited due to body habitus. Right Kidney: Renal measurements: 9.5 x 4.8 x 4.7 cm = volume: 112 mL. Normal echogenicity. No hydronephrosis or shadowing stone. Left Kidney: Renal measurements: 11.0 x 5.2 x 5.7 cm = volume: 170 mL. The left kidney is poorly visualized. No definite hydronephrosis or shadowing stone. Bladder: Not seen.  Other: None. IMPRESSION: No hydronephrosis or shadowing stone. Electronically Signed   By: Anner Crete M.D.   On: 11/04/2020 20:57   DG Chest Port 1 View  Result Date: 10/31/2020 CLINICAL DATA:  Congestive heart failure EXAM: PORTABLE CHEST 1 VIEW COMPARISON:  10/21/2020 FINDINGS: Generator pack in the left axillary region. Shallow inspiration. Prominent cardiac enlargement. Perihilar infiltrates likely representing edema. No definite pleural effusion. Aortic calcification. Similar appearance to previous study. IMPRESSION: Cardiac enlargement with perihilar edema.  No significant change. Electronically Signed   By: Lucienne Capers M.D.   On: 10/31/2020 05:53   DG Chest Port 1 View  Result Date: 11/01/2020 CLINICAL DATA:  Shortness of breath EXAM: PORTABLE CHEST 1 VIEW COMPARISON:  09/12/2020 FINDINGS: Cardiomegaly with vascular congestion and mild hazy interstitial opacities suspicious for pulmonary edema. Suspected left pleural effusion. Aortic atherosclerosis. No pneumothorax. IMPRESSION: Cardiomegaly with vascular congestion and mild hazy interstitial opacities suspicious for pulmonary edema. Suspect left pleural effusion. Electronically Signed   By: Donavan Foil M.D.   On: 10/29/2020 19:45    Medications:   . Chlorhexidine Gluconate Cloth  6 each Topical Daily  . hydrALAZINE  50 mg Oral Q8H  . insulin aspart  0-5 Units Subcutaneous QHS  . insulin aspart  0-9 Units Subcutaneous TID WC  . isosorbide mononitrate  30 mg Oral Daily  . mouth rinse  15 mL Mouth Rinse BID  . pantoprazole  40 mg Oral Daily  . rosuvastatin  10 mg Oral QHS  . sodium chloride flush   3 mL Intravenous Q12H   Continuous Infusions: . sodium chloride    . cefTRIAXone (ROCEPHIN)  IV    . heparin    . milrinone 0.125 mcg/kg/min (10/31/20 0900)     LOS: 1 day   Geradine Girt  [facility]   How to contact the [facility] Attending or Consulting provider [place] or covering provider during after hours [place], for this patient?  1. Check the care team in [facility] and look for a) attending/consulting TR[facility] provider listed and b) the [facility] team listed 2. Log into [email or web address] and use [place]'s universal password to access. If you do not have the password, please contact the hospital operator. 3. Locate the [facility] provider you are looking for under [facility] and page to a number that you can be directly reached. 4. If you still have difficulty reaching the provider, please page the [facility] (Director on Call) for the Hospitalists listed on amion for assistance.  10/31/2020, 11:19 AM

## 2020-10-31 NOTE — Procedures (Signed)
Bronchoscopy Procedure Note  [name]  [id number]  28-Oct-1956  Date:10/31/20  Time:2:55 PM   Provider Performing:Mitzy Naron C Tamala Julian   Procedure(s):  Flexible bronchoscopy with bronchial alveolar lavage [id number])  Indication(s) LLL collapse on CXR  Consent Risks of the procedure as well as the alternatives and risks of each were explained to the patient and/or caregiver.  Consent for the procedure was obtained and is signed in the bedside chart  Anesthesia In place for intubation   Time Out Verified patient identification, verified procedure, site/side was marked, verified correct patient position, special equipment/implants available, medications/allergies/relevant history reviewed, required imaging and test results available.   Sterile Technique Usual hand hygiene, masks, gowns, and gloves were used   Procedure Description Bronchoscope advanced through endotracheal tube and into airway.  Airways were examined down to subsegmental level with findings noted below.   Following diagnostic evaluation, BAL(s) performed in LLL with normal saline and return of clear fluid with mucus plugs fluid  Findings:  - Left mainstem collapse and mucus plugging, suctioned - Extrinsic compression of left mainstem confirmed to be caused by cardiomegaly using bedside US of left chest - ETT in good position - Thick mucoid secretions in both lungs suctioned   Complications/Tolerance None; patient tolerated the procedure well. Chest X-ray is not needed post procedure.   EBL Minimal   Specimen(s) BAL LLL

## 2020-10-31 NOTE — Evaluation (Signed)
Physical Therapy Evaluation Patient Details Name: [name] MRN: [id number] DOB: 1956/01/31 Today's Date: 10/31/2020   History of Present Illness  Pt is a 64 y/o F presenting to the ED on 11/23 after home health suspects renal failure. PMH includes morbid obesity, chronic systolic/diastolic CHF, PSVT, CVA, HTN, DM, OSA, CKD, COPD, pulmonary HTN, and CSF rhinorrhea  Clinical Impression  Pt was limited in ability to participate in PT secondary to bilateral UE dyskinesia that became worse with pt participation, whether verbal or physical. Pt showed at least 3/5 strength on UE by being able to reach past 90 degrees without assistance and 2/5 strength in the LE by being able to partially move through ROM. Pt wasn't able to participate in further therapy due to decreased ability to use extremities due to safety issues. Pt showed decreased cognition and increased perseveration as UE dyskinesia increased. Pt would benefit from acute PT in order to further assess PLOF once bilateral UE dyskinesia decreases. Pt would benefit from [facility] at d/c due to medical instability and pts current level of mobility. Vitals: 116/57, 92 bpm, 4L of O2 on 90%    Follow Up Recommendations Supervision/Assistance - 24 hour;LTACH    Equipment Recommendations  Hospital bed;Wheelchair (measurements PT);Wheelchair cushion (measurements PT)    Recommendations for Other Services OT consult;Speech consult (speech for cognition)     Precautions / Restrictions        Mobility  Bed Mobility Overal bed mobility: Needs Assistance             General bed mobility comments: move towards HOB total +2 (didn't do trendelenburg secondary to UE dyskinesia)    Transfers                 General transfer comment: unable to assess secondary to dyskinesia  Ambulation/Gait                Stairs            Wheelchair Mobility    Modified Rankin (Stroke Patients Only)       Balance                                              Pertinent Vitals/Pain Pain Assessment: Faces Faces Pain Scale: Hurts little more Pain Location: legs Pain Intervention(s): Monitored during session    Home Living Family/patient expects to be discharged to:: Private residence Living Arrangements: Alone Available Help at Discharge: Family;Available PRN/intermittently (sister) Type of Home: House Home Access: Stairs to enter;Ramped entrance Entrance Stairs-Rails: Left Entrance Stairs-Number of Steps: 1 Home Layout: One level Home Equipment: Walker - 4 wheels Additional Comments: states she is going to stay with her sister after    Prior Function Level of Independence: Independent               Hand Dominance   Dominant Hand: Right    Extremity/Trunk Assessment   Upper Extremity Assessment Upper Extremity Assessment: Overall WFL for tasks assessed (pt able to at least reach to 90 degrees but dyskinesia becomes significant)    Lower Extremity Assessment Lower Extremity Assessment: Generalized weakness (bilateral LE swelling; pt didn't have any dyskinesia in LE; when PT touched foot, dyskinesia in the UE became wrose; pt at least able to perform partial heel slides and do partial SAQs)    Cervical / Trunk Assessment Cervical / Trunk Assessment: Normal  Communication   Communication: Other (comment);Expressive difficulties (perseverates on answers, communication becomes worse when dyskinesia becomes worse)  Cognition Arousal/Alertness: Awake/alert   Overall Cognitive Status: Impaired/Different from baseline Area of Impairment: Attention;Following commands;Safety/judgement;Awareness;Problem solving                   Current Attention Level: Focused   Following Commands: Follows one step commands inconsistently;Follows one step commands with increased time Safety/Judgement: Decreased awareness of safety;Decreased awareness of deficits Awareness:  Intellectual Problem Solving: Slow processing;Decreased initiation;Difficulty sequencing General Comments: pt perseverates on answers and needed questions asked multiple times, pt becomes increasingly unable to answer questions as dyskinesia increases; pt has increased persevaration and ability to answer questions as dyskinesia increased; pt able to answer questions as dyskinesia decreased; dyskinesia decreased once pt focused on something else (tv or eating)      General Comments General comments (skin integrity, edema, etc.): unable to assess secondary to safety risk with dyskinesia    Exercises     Assessment/Plan    PT Assessment Patient needs continued PT services  PT Problem List Decreased strength;Decreased mobility;Decreased activity tolerance;Decreased cognition;Decreased coordination;Decreased safety awareness;Pain;Decreased balance       PT Treatment Interventions Stair training;Gait training;Functional mobility training;Therapeutic exercise;Balance training;Therapeutic activities;Neuromuscular re-education;Cognitive remediation;Patient/family education    PT Goals (Current goals can be found in the Care Plan section)  Acute Rehab PT Goals Patient Stated Goal: unable to state Time For Goal Achievement: 11/15/20    Frequency Min 2X/week   Barriers to discharge Decreased caregiver support lack of support at d/c    Co-evaluation               AM-PAC PT "6 Clicks" Mobility  Outcome Measure Help needed turning from your back to your side while in a flat bed without using bedrails?: Total Help needed moving from lying on your back to sitting on the side of a flat bed without using bedrails?: Total Help needed moving to and from a bed to a chair (including a wheelchair)?: Total Help needed standing up from a chair using your arms (e.g., wheelchair or bedside chair)?: Total Help needed to walk in hospital room?: Total Help needed climbing 3-5 steps with a railing? :  Total 6 Click Score: 6    End of Session Equipment Utilized During Treatment: Oxygen Activity Tolerance: Treatment limited secondary to medical complications (Comment) (UE dyskinesia making tx unsafe)   Nurse Communication: Mobility status;Other (comment) (bed rail padding) PT Visit Diagnosis: Other symptoms and signs involving the nervous system (R29.898);Pain Pain - Right/Left:  (bilateral) Pain - part of body: Leg    Time: 2878-6767 PT Time Calculation (min) (ACUTE ONLY): 29 min   Charges:   PT Evaluation $PT Eval Moderate Complexity: 1 Mod PT Treatments $Therapeutic Activity: 8-22 mins        Caleb Popp, SPT [id number]  Brenton Joines 10/31/2020, 9:57 AM

## 2020-10-31 NOTE — Progress Notes (Signed)
[place] Progress Note Patient Name: Kimanh Templeman DOB: 09-27-1956 MRN: [id number]   Date of Service  10/31/2020  HPI/Events of Note  Informed of right mainstem intubation Tip of OGT in mid stomach  eICU Interventions  Informed RT and bedside RN to pul ET tube back by 4 cm as per radiology recommendation Ok OG use     Intervention Category Intermediate Interventions: Diagnostic test evaluation  Judd Lien 10/31/2020, 9:49 PM

## 2020-10-31 NOTE — Progress Notes (Signed)
CXR reviewed.  Will have RT retract ETT 3 cm.  Chesley Mires, MD [place] Pager - [phone] 10/31/2020, 9:42 PM

## 2020-10-31 NOTE — CV Procedure (Signed)
Echo attempted but patient unable to lay still/jerking movement. Nurse aware. Will try later when patient cant lay still.

## 2020-10-31 NOTE — Progress Notes (Signed)
[place] Progress Note Patient Name: [name] DOB: May 17, 1956 MRN: [id number]   Date of Service  10/31/2020  HPI/Events of Note  Patient had inadvertently self extubated requiring reintubation.  eICU Interventions  Bilateral wrist restraints ordered     Intervention Category Major Interventions: Airway management Minor Interventions: Agitation / anxiety - evaluation and management  Judd Lien 10/31/2020, 8:55 PM

## 2020-10-31 NOTE — Progress Notes (Signed)
ANTICOAGULATION CONSULT NOTE - Initial Consult  Pharmacy Consult for Heparin Indication: atrial fibrillation  Allergies  Allergen Reactions  . Atorvastatin Nausea And Vomiting and Other (See Comments)    "legs cramped; moderate to almost severe"- also  . Ibuprofen Other (See Comments)    Per pt, MD doesn't want pt to take this   . Metformin And Related Nausea And Vomiting  . Ace Inhibitors Cough and Nausea And Vomiting  . Hydrocodone-Acetaminophen Nausea And Vomiting  . Albuterol Other (See Comments)    Caused SVT, but pt is currently using it as needed    Patient Measurements: Height: _0  (157.5 cm) Weight: 115.9 kg (255 lb 8.2 oz) IBW/kg (Calculated) : 50.1 Heparin Dosing Weight: 79.5 kg  Vital Signs: Temp: 97.5 F (36.4 C) (11/24 0645) Temp Source: Oral (11/24 0645) BP: 116/57 (11/24 0900) Pulse Rate: 89 (11/24 0900)  Labs: Recent Labs    11/01/2020 1611 10/19/2020 2328 10/31/20 0741  HGB 11.9*  --  12.2  HCT 41.1  --  36.0  PLT 209  --   --   CREATININE 4.87* 4.81*  --     Estimated Creatinine Clearance: 14.4 mL/min (A) (by C-G formula based on SCr of 4.81 mg/dL (H)).   Medical History: Past Medical History:  Diagnosis Date  . Anxiety    doesn't take any meds for this  . Arteriosclerosis of coronary artery 08/31/2011   Myoview 9/12: EF 44, no ischemia // Myoview 8/18: EF 57, low risk, possible small area of inferior ischemia // Minor nonobstructive CAD by cardiac catheterization - LHC 8/18: Proximal LAD 35, OM2 25  . Arthritis    knuckles  . Asthma   . Carotid artery disease ([place])    Carotid US 7/14: Bilateral ICA 40-59 // Carotid US ([facility]) 2/17: bilat plaque without sig stenosis  . Chronic diastolic CHF (congestive heart failure) ([place]) 07/26/2017   Echo 7/18:Severe conc LVH, EF 60-65, no RWMA, Gr 2 DD, MAC, mild MR // right and left heart cath 8/18: LVEDP 21  . Claustrophobia 07/26/2012  . COPD (chronic obstructive pulmonary disease) ([place])    "chronic  bronchitis - about every winter"  . CSF leak    dizziness/headache - assoc symptoms // 2/2 encephalocele s/p skull base reconstruction  . Diverticulosis   . Dyspnea   . History of CVA (cerebrovascular accident) 03/2004   denies residual 07/26/2012  . Hx of gout   . Hyperlipidemia    takes Pravastatin daily  . Hypertensive heart disease with chronic diastolic congestive heart failure ([place]) 07/26/2017  . Internal hemorrhoids   . Kidney stones   . LVH (left ventricular hypertrophy)    Echo 8/18: Severe conc LVH, EF 60-65, no RWMA, Gr 2 DD, MAC, mild MR  . OSA (obstructive sleep apnea) 07/26/2012   prior CPAP - stopped due to being primary caregiver for dying parent (stopped in 2016)  . PSVT (paroxysmal supraventricular tachycardia) ([place])   . Pulmonary hypertension, unspecified ([place]) 08/14/2017   cath 07/2017 >>PASP 80m Hg, PA mean 39 mm Hg  . Type II diabetes mellitus (HCC)     Medications:  Apixaban 5 mg BID prior to admission (last dose 11/23 PM)  Assessment: Pt is a 89YOF with a history of Afib (on apixaban 5 mg BID PTA). She is admitted for worsening CKD and being treated for a UTI. Pharmacy has been consulted to start heparin given current renal dysfunction and possible procedures.   CBC stable and no signs of bleeding. Will  initiate heparin at a lower dose without a bolus with risk of poor renal clearance and accumulation. Last dose of apixaban 2.5 on 11/23 around 2300, will follow aPTT and HL until levels correlate.   Goal of Therapy:  Heparin level 0.3-0.7 units/ml aPTT 66-102 seconds Monitor platelets by anticoagulation protocol: Yes   Plan:  Start heparin infusion at 1100 units/hr. Check aPTT in 8 hours Daily HL, aPTT, and CBC while on heparin  Jacobo Forest PharmD Candidate 2022 10/31/2020 11:00 AM

## 2020-10-31 NOTE — Progress Notes (Signed)
RT transported patient from 2H07 to CT and back with RN. No complications. RT will continue to monitor.

## 2020-10-31 NOTE — Progress Notes (Signed)
EEG complete - results pending 

## 2020-10-31 NOTE — Procedures (Signed)
Intubation Procedure Note  [name]  [id number]  06/24/1956  Date:10/31/20  Time:2:53 PM   Provider Performing:Raheen Capili C Tamala Julian    Procedure: Intubation (31500)  Indication(s) Respiratory Failure  Consent Risks of the procedure as well as the alternatives and risks of each were explained to the patient and/or caregiver.  Consent for the procedure was obtained and is signed in the bedside chart   Anesthesia Etomidate, Versed, Fentanyl and Rocuronium   Time Out Verified patient identification, verified procedure, site/side was marked, verified correct patient position, special equipment/implants available, medications/allergies/relevant history reviewed, required imaging and test results available.   Sterile Technique Usual hand hygeine, masks, and gloves were used   Procedure Description Patient positioned in bed supine.  Sedation given as noted above.  Patient was intubated with endotracheal tube using Glidescope.  View was Grade 2 only posterior commissure .  Number of attempts was 1.  Colorimetric CO2 detector was consistent with tracheal placement.   Complications/Tolerance None; patient tolerated the procedure well. Chest X-ray is ordered to verify placement.   EBL Minimal   Specimen(s) None

## 2020-10-31 NOTE — Consult Note (Addendum)
10/31/2020  I have seen and evaluated the patient for worsening encephalopathy.  S:  64 year old woman with groups 2/3 pulmonary HTN, CKD3, OSA/OHS presenting with decompensated heart failure, acute renal injury question cardiorenal syndrome.  This AM patient began having twitching movements of extremities with fluctuating mental status raising concern for seizures and postictal state.  CHF team, nephrology, neurology have been consulted. PCCM consulted for central line and assistance navigating multiorgan dysfunction in patient with tenuous baseline respiratory status.  Per family last several months have been rough for [name], she has been barely able to get out of bed, MMRC 2-3 dyspnea.  Worsening ability to do ADLs, weakness, anorexia.  Chart review reveals weight is currently 20+ lbs above discharge weight in July 2021.  O: Blood pressure (!) 116/57, pulse 89, temperature (!) 97.5 F (36.4 C), temperature source Oral, resp. rate (!) 22, height _0  (1.575 m), weight 115.9 kg, SpO2 92 %.  Confused ill appearing woman with twitching in bed Answers simple questions but forgets what was said (short term memory loss) Moves all 4 ext to command Lungs diminished at bases with crackles Diffuse anasarca   RHC July 2021 Fick CI 1.9, Thermo CI 2.5 PVR 4.4 (thermo), 5.7 (fick) RA 16 Wedge 25 PA mean 48  A:  Acute toxic metabolic encephalopathy Myoclonic movement with AMS intermittent question seizures Acute on chronic kidney disease question cardiorenal vs. Advancing CKD Class 3 obesity Biventricular decompensated systolic and diastolic heart failure UTI- on ceftriaxone, continue Failure to thrive Groups 2/3 pulmonary HTN (OSA, OHS, diastolic heart failure), not much of transpulmonary gradient, last RHC unfortunately was not at euvolemia  Poor venous access  P:  Spent good deal of time speaking with family regarding options going forward as patient has stated she does not  want life support nor long-term hemodialysis.  Discussed 3 options as detailed by Richardson Landry Minor below, family discussing between options 1 and 2.  Further aggressive seizure/stroke workup pending family discussions   Patient critically ill due to toxic encephalopathy Interventions to address this today family discussions, potential intubation, CT head, and EEG Risk of deterioration without these interventions is high  I personally spent 34 minutes providing critical care not including any separately billable procedures  Erskine Emery MD [facility] 10/31/2020 12:01 PM Personal pager: [phone] If unanswered, please page CCM On-call: [phone]      NAME:  [name], MRN:  [id number], DOB:  [birth year], LOS: 1 ADMISSION DATE:  10/28/2020, CONSULTATION DATE: 10/31/2020 REFERRING MD: [facility], CHIEF COMPLAINT: Generalized failure to thrive congestive heart failure coupled with worsening renal failure.  Brief History   64 year old female with a plethora of health issues and generalized failure to thrive for the last 2 to 3 months.  History of present illness   64 year old female with a plethora of health issues which are well documented below and are significant for congestive heart failure diastolic systolic, chronic hypoxic respiratory on 3 L nasal cannula continuously documented OSA not sure if she is on CPAP, chronic kidney disease with elevated creatinine 3.8 her baseline is 1.9.  She presented with increasing respiratory distress volume overload treated with IV diuresis. She had poor venous access pulmonary critical care was asked to attempt to place a central line in her 10/31/2020 and on examination she was found to be in a seizure activity.  The family was extremely distraught long conversation per Dr. Tamala Julian and NP Minor concerning goals of care.  She is stated she would  not want to be intubated nor have dialysis in the future.  We have offered 3  different pathways 1 being comfort care without further acute interventions.  Second to be attempted intubation central line placement with aggressive diuresis using inotropes as an assist.  Should this fail then she will go to option #1.  Option #3 would be intubation tracheostomy if needed hemodialysis for long-term hemodialysis which they stated they do not want.  Due to the severity of her illness pulmonary critical care will assume her care at this time unless a course of comfort care is assumed.  Past Medical History   Past Medical History:  Diagnosis Date  . Anxiety    doesn't take any meds for this  . Arteriosclerosis of coronary artery 08/31/2011   Myoview 9/12: EF 44, no ischemia // Myoview 8/18: EF 57, low risk, possible small area of inferior ischemia // Minor nonobstructive CAD by cardiac catheterization - LHC 8/18: Proximal LAD 35, OM2 25  . Arthritis    knuckles  . Asthma   . Carotid artery disease ([place])    Carotid US 7/14: Bilateral ICA 40-59 // Carotid US ([facility]) 2/17: bilat plaque without sig stenosis  . Chronic diastolic CHF (congestive heart failure) ([place]) 07/26/2017   Echo 7/18:Severe conc LVH, EF 60-65, no RWMA, Gr 2 DD, MAC, mild MR // right and left heart cath 8/18: LVEDP 21  . Claustrophobia 07/26/2012  . COPD (chronic obstructive pulmonary disease) ([place])    "chronic bronchitis - about every winter"  . CSF leak    dizziness/headache - assoc symptoms // 2/2 encephalocele s/p skull base reconstruction  . Diverticulosis   . Dyspnea   . History of CVA (cerebrovascular accident) 03/2004   denies residual 07/26/2012  . Hx of gout   . Hyperlipidemia    takes Pravastatin daily  . Hypertensive heart disease with chronic diastolic congestive heart failure ([place]) 07/26/2017  . Internal hemorrhoids   . Kidney stones   . LVH (left ventricular hypertrophy)    Echo 8/18: Severe conc LVH, EF 60-65, no RWMA, Gr 2 DD, MAC, mild MR  . OSA (obstructive sleep apnea) 07/26/2012    prior CPAP - stopped due to being primary caregiver for dying parent (stopped in 2016)  . PSVT (paroxysmal supraventricular tachycardia) ([place])   . Pulmonary hypertension, unspecified ([place]) 08/14/2017   cath 07/2017 >>PASP 31m Hg, PA mean 39 mm Hg  . Type II diabetes mellitus ([place]      Significant Hospital Events   10/31/2020 documented seizure  Consults:  10/31/2020 pulmonary critical care Procedures:    Significant Diagnostic Tests:  11/01/2019 one 2D echo pending  Micro Data:  10/31/2020 urine culture 10/31/2020 blood cultures x2   Antimicrobials:  10/10/2020 ceftriaxone for presumed urinary tract infection>>  Interim history/subjective:  10/31/2020 pulmonary critical care called to bedside for placement of central line finds her having jerky movements with subsequent seizure.  If she remains in intensive care unit pulmonary critical care will assume her care  Objective   Blood pressure (!) 116/57, pulse 89, temperature (!) 97.5 F (36.4 C), temperature source Oral, resp. rate (!) 22, height _0  (1.575 m), weight 115.9 kg, SpO2 92 %.        Intake/Output Summary (Last 24 hours) at 10/31/2020 1140 Last data filed at 10/31/2020 0900 Gross per 24 hour  Intake 123.81 ml  Output 495 ml  Net -371.19 ml   Filed Weights   10/13/2020 1603 11/03/2020 2300 10/31/20 0333  Weight: 118.8  kg 116.2 kg 115.9 kg    Examination: General: Morbidly obese female who is having obvious seizure activity HENT: Short neck Lungs: Decreased breath sounds throughout bibasilar crackles Cardiovascular: Distant Abdomen: Obese multiple holding penicillins Extremities: Positive edema Neuro: Jerking movement of the right arm and head with subsequent seizure GU: Amber urine  Resolved Hospital Problem list     Assessment & Plan:  Toxic metabolic encephalopathy, seizures, in the setting of acute on chronic renal failure. Discussed with family concerning short-term intubation and possible  short-term course of dialysis.  Ongoing discussions with family currently. Diuresis as tolerated Central line placement if possible Neurology  consult  Congestive heart failure Paroxysmal atrial fibrillation Hypertension Cards consult 2D echo pending Poor response to diuresis  Progressive failure to thrive  Patient is currently discussing options for intubation possible hemodialysis and further diuresis if possible.  The question is if she will return any quality of life that would be acceptable her.  Again her family is discussing this currently.     Best practice (evaluated daily)   Diet: NPO Pain/Anxiety/Delirium protocol (if indicated): Not indicated VAP protocol (if indicated): If intubated DVT prophylaxis: In place GI prophylaxis: PPI Glucose control: Sliding scale insulin protocol Mobility: Bedrest last date of multidisciplinary goals of care discussion 10/31/2020 niece and sister updated at bedside by RN, NP, Dr. Tamala Julian Family and staff present S. minor ACNP, Dr. Tamala Julian.  Bedside RN Summary of discussion ongoing family discussions concerning CODE STATUS Follow up goals of care discussion due pending at this time Code Status: Pending at this time Disposition: Currently in ICU may be transitioned to comfort care in the floor  Labs   CBC: Recent Labs  Lab 10/26/2020 1611 10/31/20 0741  WBC 7.1  --   HGB 11.9* 12.2  HCT 41.1 36.0  MCV 93.4  --   PLT 209  --     Basic Metabolic Panel: Recent Labs  Lab 10/27/2020 1611 10/09/2020 2328 10/31/20 0741  NA 137 137 139  K 4.3 4.2 4.0  CL 95* 98  --   CO2 27 23  --   GLUCOSE 121* 168*  --   BUN 53* 55*  --   CREATININE 4.87* 4.81*  --   CALCIUM 7.9* 7.8*  --    GFR: Estimated Creatinine Clearance: 14.4 mL/min (A) (by C-G formula based on SCr of 4.81 mg/dL (H)). Recent Labs  Lab 10/17/2020 1611  WBC 7.1    Liver Function Tests: Recent Labs  Lab 10/11/2020 2146  AST 18  ALT 8  ALKPHOS 72  BILITOT 1.4*  PROT  7.2  ALBUMIN 3.9   No results for input(s): LIPASE, AMYLASE in the last 168 hours. No results for input(s): AMMONIA in the last 168 hours.  ABG    Component Value Date/Time   PHART 7.370 10/31/2020 0741   PCO2ART 53.9 (H) 10/31/2020 0741   PO2ART 73 (L) 10/31/2020 0741   HCO3 31.3 (H) 10/31/2020 0741   TCO2 33 (H) 10/31/2020 0741   ACIDBASEDEF 0.8 12/20/2008 0130   O2SAT 94.0 10/31/2020 0741     Coagulation Profile: No results for input(s): INR, PROTIME in the last 168 hours.  Cardiac Enzymes: No results for input(s): CKTOTAL, CKMB, CKMBINDEX, TROPONINI in the last 168 hours.  HbA1C: Hgb A1c MFr Bld  Date/Time Value Ref Range Status  10/12/2020 02:50 PM 6.1 (H) <5.7 % of total Hgb Final    Comment:    For someone without known diabetes, a hemoglobin  A1c value between  5.7% and 6.4% is consistent with prediabetes and should be confirmed with a  follow-up test. . For someone with known diabetes, a value <7% indicates that their diabetes is well controlled. A1c targets should be individualized based on duration of diabetes, age, comorbid conditions, and other considerations. . This assay result is consistent with an increased risk of diabetes. . Currently, no consensus exists regarding use of hemoglobin A1c for diagnosis of diabetes for children. Marland Kitchen   04/16/2020 11:01 PM 6.3 (H) 4.8 - 5.6 % Final    Comment:    (NOTE) Pre diabetes:          5.7%-6.4% Diabetes:              >6.4% Glycemic control for   <7.0% adults with diabetes     CBG: Recent Labs  Lab 10/11/2020 2330 10/31/20 0642  GLUCAP 154* 141*    Review of Systems:   NA  Past Medical History  She,  has a past medical history of Anxiety, Arteriosclerosis of coronary artery (08/31/2011), Arthritis, Asthma, Carotid artery disease ([place]), Chronic diastolic CHF (congestive heart failure) ([place]) (07/26/2017), Claustrophobia (07/26/2012), COPD (chronic obstructive pulmonary disease) ([place]), CSF leak,  Diverticulosis, Dyspnea, History of CVA (cerebrovascular accident) (03/2004), gout, Hyperlipidemia, Hypertensive heart disease with chronic diastolic congestive heart failure ([place]) (07/26/2017), Internal hemorrhoids, Kidney stones, LVH (left ventricular hypertrophy), OSA (obstructive sleep apnea) (07/26/2012), PSVT (paroxysmal supraventricular tachycardia) ([place]), Pulmonary hypertension, unspecified ([place]) (08/14/2017), and Type II diabetes mellitus ([place]).   Surgical History    Past Surgical History:  Procedure Laterality Date  . ESOPHAGOGASTRODUODENOSCOPY (EGD) WITH PROPOFOL N/A 07/13/2015   Procedure: ESOPHAGOGASTRODUODENOSCOPY (EGD) WITH PROPOFOL;  Surgeon: Carol Ada, MD;  Location: [facility];  Service: Endoscopy;  Laterality: N/A;  . NASAL SINUS SURGERY  01/29/2012   Procedure: ENDOSCOPIC SINUS SURGERY WITH STEALTH;  Surgeon: Ruby Cola, MD;  Location: [facility];  Service: ENT;  Laterality: N/A;  . NM MYOVIEW LTD  08/25/2011   Low risk study, no evidence of ischemia, EF 44%  . REFRACTIVE SURGERY  ~ 2010   left  . RIGHT HEART CATH N/A 06/29/2020   Procedure: RIGHT HEART CATH;  Surgeon: Jolaine Artist, MD;  Location: [facility];  Service: Cardiovascular;  Laterality: N/A;  . RIGHT/LEFT HEART CATH AND CORONARY ANGIOGRAPHY N/A 07/30/2017   Procedure: RIGHT/LEFT HEART CATH AND CORONARY ANGIOGRAPHY;  Surgeon: Martinique, Peter M, MD;  Location: [facility];  Service: Cardiovascular;  Laterality: N/A;  . SPHENOIDECTOMY  01/29/2012   Procedure: SPHENOIDECTOMY;  Surgeon: Ruby Cola, MD;  Location: [facility];  Service: ENT;  Laterality: Left;  REPAIR OF LEFT SPHENOID    . SVT ABLATION N/A 05/21/2020   Procedure: SVT ABLATION;  Surgeon: Evans Lance, MD;  Location: [facility];  Service: Cardiovascular;  Laterality: N/A;  . TOTAL ABDOMINAL HYSTERECTOMY  1994     Social History   reports that she quit smoking about 14 months ago. Her smoking use included cigarettes. She has a  17.50 pack-year smoking history. She has never used smokeless tobacco. She reports current alcohol use of about 1.0 standard drink of alcohol per week. She reports that she does not use drugs.   Family History   Her family history includes Alzheimer's disease in her mother; Breast cancer in her sister; Breast cancer (age of onset: 64) in her mother; Cancer in her sister; Colon cancer (age of onset: 71) in her sister; Diabetes (age of onset: 37) in her sister; Diabetes (age  of onset: 58) in her sister; Heart failure in her sister; Lung cancer in her father. There is no history of Anesthesia problems, Hypotension, Malignant hyperthermia, or Pseudochol deficiency.   Allergies Allergies  Allergen Reactions  . Atorvastatin Nausea And Vomiting and Other (See Comments)    "legs cramped; moderate to almost severe"- also  . Ibuprofen Other (See Comments)    Per pt, MD doesn't want pt to take this   . Metformin And Related Nausea And Vomiting  . Ace Inhibitors Cough and Nausea And Vomiting  . Hydrocodone-Acetaminophen Nausea And Vomiting  . Albuterol Other (See Comments)    Caused SVT, but pt is currently using it as needed     Home Medications  Prior to Admission medications   Medication Sig Start Date End Date Taking? Authorizing Provider  acetaminophen (TYLENOL) 325 MG tablet Take 2 tablets (650 mg total) by mouth every 6 (six) hours as needed for mild pain. Patient taking differently: Take 650 mg by mouth every 6 (six) hours as needed for mild pain (or headaches).  06/30/18  Yes Luetta Nutting, DO  albuterol (VENTOLIN HFA) 108 (90 Base) MCG/ACT inhaler TAKE 2 PUFFS BY MOUTH EVERY 6 HOURS AS NEEDED FOR WHEEZE OR SHORTNESS OF BREATH Patient taking differently: Inhale 2 puffs into the lungs every 6 (six) hours as needed for wheezing or shortness of breath.  12/19/19  Yes Luetta Nutting, DO  allopurinol (ZYLOPRIM) 100 MG tablet Take 100 mg by mouth daily as needed (for gout flares).  06/24/18  Yes  [provider]  apixaban (ELIQUIS) 5 MG TABS tablet Take 1 tablet (5 mg total) by mouth 2 (two) times daily. 06/20/20  Yes Evans Lance, MD  flecainide (TAMBOCOR) 50 MG tablet Take 1 tablet (50 mg total) by mouth 2 (two) times daily. 06/20/20  Yes Evans Lance, MD  FLUoxetine (PROZAC) 20 MG capsule Take 1 capsule (20 mg total) by mouth at bedtime. 04/13/20  Yes Nche, Charlene Brooke, NP  gabapentin (NEURONTIN) 100 MG capsule Take 200 mg by mouth at bedtime.  10/27/20  Yes [provider]  hydrALAZINE (APRESOLINE) 50 MG tablet TAKE 1 TABLET BY MOUTH EVERY 8 HOURS. Patient taking differently: Take 50 mg by mouth every 8 (eight) hours.  10/08/20  Yes Evans Lance, MD  metolazone (ZAROXOLYN) 2.5 MG tablet Take one tablet ONCE a week on Wednesday ONLY. Patient taking differently: Take 2.5 mg by mouth every Saturday.  08/08/20  Yes Evans Lance, MD  nitroGLYCERIN (NITROSTAT) 0.4 MG SL tablet Place 1 tablet (0.4 mg total) under the tongue every 5 (five) minutes as needed. Patient taking differently: Place 0.4 mg under the tongue every 5 (five) minutes as needed for chest pain.  07/27/17  Yes Weaver, Scott T, PA-C  omeprazole (PRILOSEC) 20 MG capsule Take 1 capsule (20 mg total) by mouth daily. 04/24/20  Yes Nche, Charlene Brooke, NP  OXYGEN Inhale 3-4 L/min into the lungs continuous.   Yes [provider]  potassium chloride (KLOR-CON) 10 MEQ tablet Take 2 tablets by mouth ONCE a week on Wednesday ONLY. Patient taking differently: Take 20 mEq by mouth every Saturday.  08/08/20  Yes Evans Lance, MD  PRESCRIPTION MEDICATION Apply 1 patch topically See admin instructions. Zio patch- As directed   Yes [provider]  rosuvastatin (CRESTOR) 40 MG tablet Take 1 tablet (40 mg total) by mouth at bedtime. 10/16/20  Yes Nche, Charlene Brooke, NP  torsemide (DEMADEX) 20 MG tablet Take 2 tablets (  40 mg total) by mouth daily. Patient taking differently: Take 60 mg by mouth daily.   05/03/20  Yes Simmons, Brittainy M, PA-C  Verapamil HCl CR 300 MG CP24 TAKE 1 CAPSULE (300 MG TOTAL) BY MOUTH DAILY. PLEASE CALL TO SCHEDULE OVERDUE APPT WITH DR ROSS Patient taking differently: Take 300 mg by mouth daily.  04/04/20  Yes Nche, Charlene Brooke, NP  Blood Glucose Monitoring Suppl (ONETOUCH VERIO) w/Device KIT USE TO CHECK BLOOD GLUCOSE 4 TIMES A DAY . DIAGNOSIS: TYPE 2 DIABETES E11.59 11/09/19   Luetta Nutting, DO  gabapentin (NEURONTIN) 400 MG capsule Take 1 capsule (400 mg total) by mouth at bedtime. Patient not taking: Reported on 11/03/2020 04/24/20   Nche, Charlene Brooke, NP  glucose blood (ONETOUCH VERIO) test strip For E11.22.  Use to check glucose qid 11/09/19   Luetta Nutting, DO  isosorbide mononitrate (IMDUR) 60 MG 24 hr tablet TAKE 1 TABLET (60 MG TOTAL) BY MOUTH DAILY FOR 30 DAYS. ** DO NOT CRUSH ** Patient taking differently: Take 60 mg by mouth daily.  06/12/20   Nche, Charlene Brooke, NP     Critical care time: 35 min per app     Richardson Landry Minor ACNP Acute Care Nurse Practitioner [place] Please consult Amion 10/31/2020, 11:44 AM

## 2020-10-31 NOTE — Procedures (Signed)
Central Venous Catheter Insertion Procedure Note  [name]  [id number]  1956/01/29  Date:10/31/20  Time:2:54 PM   Provider Performing:Danilyn Cocke Loletha Grayer Tamala Julian   Procedure: Insertion of Non-tunneled Central Venous [phone]) with US guidance (84696)   Indication(s) Difficult access  Consent Risks of the procedure as well as the alternatives and risks of each were explained to the patient and/or caregiver.  Consent for the procedure was obtained and is signed in the bedside chart  Anesthesia Topical only with 1% lidocaine   Timeout Verified patient identification, verified procedure, site/side was marked, verified correct patient position, special equipment/implants available, medications/allergies/relevant history reviewed, required imaging and test results available.  Sterile Technique Maximal sterile technique including full sterile barrier drape, hand hygiene, sterile gown, sterile gloves, mask, hair covering, sterile ultrasound probe cover (if used).  Procedure Description Area of catheter insertion was cleaned with chlorhexidine and draped in sterile fashion.  With real-time ultrasound guidance a central venous catheter was placed into the left internal jugular vein. Nonpulsatile blood flow and easy flushing noted in all ports.  The catheter was sutured in place and sterile dressing applied.  Complications/Tolerance None; patient tolerated the procedure well. Chest X-ray is ordered to verify placement for internal jugular or subclavian cannulation.   Chest x-ray is not ordered for femoral cannulation.  EBL Minimal  Specimen(s) None

## 2020-10-31 NOTE — Plan of Care (Signed)
  Problem: Education: Goal: Ability to demonstrate management of disease process will improve Outcome: Progressing Goal: Ability to verbalize understanding of medication therapies will improve Outcome: Progressing   Problem: Cardiac: Goal: Ability to achieve and maintain adequate cardiopulmonary perfusion will improve Outcome: Progressing   Problem: Education: Goal: Knowledge of General Education information will improve Description: Including pain rating scale, medication(s)/side effects and non-pharmacologic comfort measures Outcome: Progressing   Problem: Health Behavior/Discharge Planning: Goal: Ability to manage health-related needs will improve Outcome: Progressing   Problem: Clinical Measurements: Goal: Ability to maintain clinical measurements within normal limits will improve Outcome: Progressing Goal: Will remain free from infection Outcome: Progressing Goal: Diagnostic test results will improve Outcome: Progressing Goal: Respiratory complications will improve Outcome: Progressing Goal: Cardiovascular complication will be avoided Outcome: Progressing   Problem: Activity: Goal: Risk for activity intolerance will decrease Outcome: Progressing   Problem: Nutrition: Goal: Adequate nutrition will be maintained Outcome: Progressing   Problem: Coping: Goal: Level of anxiety will decrease Outcome: Progressing   Problem: Elimination: Goal: Will not experience complications related to bowel motility Outcome: Progressing   Problem: Pain Managment: Goal: General experience of comfort will improve Outcome: Progressing   Problem: Safety: Goal: Ability to remain free from injury will improve Outcome: Progressing   Problem: Skin Integrity: Goal: Risk for impaired skin integrity will decrease Outcome: Progressing   Problem: Activity: Goal: Capacity to carry out activities will improve Outcome: Not Progressing   Problem: Elimination: Goal: Will not experience  complications related to urinary retention Outcome: Not Progressing

## 2020-10-31 NOTE — Consult Note (Addendum)
Neurology Consultation Reason for Consult: Jerks concerning for seizures Referring Physician: Dr. Eulogio Bear  CC: My kidneys are bad  History is obtained from: Chart review as patient is encephalopathic  HPI: [name] is a 64 y.o. female with medical history of combined systolic and diastolic congestive heart failure, chronic hypoxic respiratory failure on 3 L of oxygen, CKD stage III, hypertension, paroxysmal A. fib not on anticoagulation who presented on 11/01/2020 due to renal dysfunction.  She reported that she was started on IV Lasix for decompensated CHF on 11/7.  However for the last couple of days her urine output has been gradually decreasing with worsening BUN and creatinine.  She also started noticing wheezing and shortness of breath.  Patient was being managed for acute on chronic congestive heart failure.  Today morning, patient was noted to have jerking-like episodes concerning for myoclonic seizures.  Therefore neurology was consulted.  Patient sister and nieces at bedside showed me a video of the episodes during which patient was noted to have bilateral upper extremity rhythmic twitching as well as eyelid twitching happening every few seconds.  Patient was given IV Ativan after which the jerking stopped and patient has been more awake, conversant.   ROS:  Unable to obtain due to altered mental status.   Past Medical History:  Diagnosis Date  . Anxiety    doesn't take any meds for this  . Arteriosclerosis of coronary artery 08/31/2011   Myoview 9/12: EF 44, no ischemia // Myoview 8/18: EF 57, low risk, possible small area of inferior ischemia // Minor nonobstructive CAD by cardiac catheterization - LHC 8/18: Proximal LAD 35, OM2 25  . Arthritis    knuckles  . Asthma   . Carotid artery disease ([place])    Carotid US 7/14: Bilateral ICA 40-59 // Carotid US ([facility]) 2/17: bilat plaque without sig stenosis  . Chronic diastolic CHF (congestive heart failure) ([place])  07/26/2017   Echo 7/18:Severe conc LVH, EF 60-65, no RWMA, Gr 2 DD, MAC, mild MR // right and left heart cath 8/18: LVEDP 21  . Claustrophobia 07/26/2012  . COPD (chronic obstructive pulmonary disease) ([place])    "chronic bronchitis - about every winter"  . CSF leak    dizziness/headache - assoc symptoms // 2/2 encephalocele s/p skull base reconstruction  . Diverticulosis   . Dyspnea   . History of CVA (cerebrovascular accident) 03/2004   denies residual 07/26/2012  . Hx of gout   . Hyperlipidemia    takes Pravastatin daily  . Hypertensive heart disease with chronic diastolic congestive heart failure ([place]) 07/26/2017  . Internal hemorrhoids   . Kidney stones   . LVH (left ventricular hypertrophy)    Echo 8/18: Severe conc LVH, EF 60-65, no RWMA, Gr 2 DD, MAC, mild MR  . OSA (obstructive sleep apnea) 07/26/2012   prior CPAP - stopped due to being primary caregiver for dying parent (stopped in 2016)  . PSVT (paroxysmal supraventricular tachycardia) ([place])   . Pulmonary hypertension, unspecified ([place]) 08/14/2017   cath 07/2017 >>PASP 28m Hg, PA mean 39 mm Hg  . Type II diabetes mellitus ([facility])     Family History  Problem Relation Age of Onset  . Colon cancer Sister 421 . Breast cancer Mother 664 . Alzheimer's disease Mother   . Diabetes Sister 497 . Heart failure Sister   . Diabetes Sister 420 . Lung cancer Father   . Breast cancer Sister   . Cancer Sister  mouth  . Anesthesia problems Neg Hx   . Hypotension Neg Hx   . Malignant hyperthermia Neg Hx   . Pseudochol deficiency Neg Hx    Social History: Per chart review, reports that she quit smoking about 14 months ago. Her smoking use included cigarettes. She has a 17.50 pack-year smoking history. She has never used smokeless tobacco. She reports current alcohol use of about 1.0 standard drink of alcohol per week. She reports that she does not use drugs.  Exam: Current vital signs: BP 118/63   Pulse 93   Temp (!) 97.5 F  (36.4 C) (Oral)   Resp (!) 22   Ht _0  (1.575 m)   Wt 115.9 kg   SpO2 94%   BMI 46.73 kg/m  Vital signs in last 24 hours: Temp:  [97.4 F (36.3 C)-97.9 F (36.6 C)] 97.5 F (36.4 C) (11/24 0645) Pulse Rate:  [58-93] 93 (11/24 1200) Resp:  [13-25] 22 (11/24 1200) BP: (104-133)/(55-87) 118/63 (11/24 1130) SpO2:  [89 %-100 %] 94 % (11/24 1200) Weight:  [115.9 kg-118.8 kg] 115.9 kg (11/24 0333)   Physical Exam  Constitutional: Appears well-developed and well-nourished.  Psych: Affect appropriate to situation Eyes: No scleral injection HENT: No OP obstrucion Head: Normocephalic.  Cardiovascular: Irregular rhythm, no murmur appreciated Respiratory: Coarse breathing, on nasal cannula GI: Soft.  No tenderness Skin: Warm, positive edema  Neuro: AOx3, follows simple commands, cranial nerves II to XII grossly intact, antigravity strength in all 4 extremities  I have reviewed labs in epic and the results pertinent to this consultation are: TSH 5.691, uric acid 12.2, normal sodium, BUN 55 with creatinine 4.81, total bilirubin 1.4 with direct bilirubin 0.4 and indirect bilirubin 1, no leukocytosis, urine analysis with 100 protein, large leukocytes, more than 50 WBCs with many bacteria, urine culture pending, ammonia pending  I have reviewed the images obtained: Chest x-ray 10/31/2020: Cardiac enlargement with perihilar edema, no significant change from yesterday.  Renal ultrasound 10/13/2020: No evidence of hydronephrosis, renal calculus.  ASSESSMENT/PLAN: 64 year old female with multiple comorbidities as noted in HPI now with seizure-like episodes.  Provoked seizures due to gabapentin toxicity Acute encephalopathy, likely ictal versus toxic-metabolic Acute on chronic systolic and diastolic heart failure AKI on CKD UTI Type 2 diabetes Paroxysmal A. Fib Chronic hypoxic respiratory failure Extreme obesity -Per MAR, last dose of gabapentin was on 10/12/2020 at 2313.  Clinically  the episodes look like myoclonic seizures most likely due to gabapentin toxicity in the setting of renal dysfunction.  Recommendations: -CT head without contrast to look for any acute abnormality -Routine EEG to assess for epileptogenicity - Agree with discontinuing gabapentin -Nephrology Dr. Royce Macadamia was also in the room discussing further management with patient and her family.  Patient in the past had discussed not wanting to be on long-term dialysis.  However, after discussion with Dr. Royce Macadamia, it seems like patient is planning to proceed with CRRT for now -As these are most likely provoked seizures due to gabapentin toxicity and patient is going to undergo CRRT, we will hold off on starting AEDs for now.  However, if patient has any further seizures, we will start Keppra 500 mg twice daily. -Seizure precaution -As needed IV Ativan 2 mg for clinical seizure lasting more than 2 minutes -Management of rest of comorbidities per primary team -Discussed assessment and plan in detail with patient and her family (sister and nieces) at bedside.  Thank you for allowing [place] to participate in the care of this patient.  Neurology  will follow.  Please call us if you have any further questions.  Zeb [facility]

## 2020-10-31 NOTE — Progress Notes (Signed)
Patient had tonic clonic seizure lasting roughly 20-25 seconds before seizing stopped. Patient now post ictal. Attending physician, Dr. Eliseo Squires, Called and made aware. States she will consult neurology and place orders. Will continue to monitor.

## 2020-10-31 NOTE — Progress Notes (Signed)
Pt self extubated and then reintubated by MD. Pt placed back on previous vent settings. 400/15/100%/+5.

## 2020-10-31 NOTE — Procedures (Signed)
Central Venous Catheter Insertion Procedure Note  Khalidah Herbold  [id number]  January 01, 1956  Date:10/31/20  Time:2:54 PM   Provider Performing:Edsel Shives Loletha Grayer Tamala Julian   Procedure: Insertion of Non-tunneled Central Venous Catheter(36556)with US guidance (72257)    Indication(s) Hemodialysis  Consent Risks of the procedure as well as the alternatives and risks of each were explained to the patient and/or caregiver.  Consent for the procedure was obtained and is signed in the bedside chart  Anesthesia Topical only with 1% lidocaine   Timeout Verified patient identification, verified procedure, site/side was marked, verified correct patient position, special equipment/implants available, medications/allergies/relevant history reviewed, required imaging and test results available.  Sterile Technique Maximal sterile technique including full sterile barrier drape, hand hygiene, sterile gown, sterile gloves, mask, hair covering, sterile ultrasound probe cover (if used).  Procedure Description Area of catheter insertion was cleaned with chlorhexidine and draped in sterile fashion.   With real-time ultrasound guidance a HD catheter was placed into the right internal jugular vein.  Nonpulsatile blood flow and easy flushing noted in all ports.  The catheter was sutured in place and sterile dressing applied.  Complications/Tolerance None; patient tolerated the procedure well. Chest X-ray is ordered to verify placement for internal jugular or subclavian cannulation.  Chest x-ray is not ordered for femoral cannulation.  EBL Minimal  Specimen(s) None

## 2020-11-01 ENCOUNTER — Inpatient Hospital Stay (HOSPITAL_COMMUNITY): Payer: Medicare Other

## 2020-11-01 DIAGNOSIS — R4182 Altered mental status, unspecified: Secondary | ICD-10-CM

## 2020-11-01 DIAGNOSIS — I5043 Acute on chronic combined systolic (congestive) and diastolic (congestive) heart failure: Secondary | ICD-10-CM | POA: Diagnosis not present

## 2020-11-01 DIAGNOSIS — I272 Pulmonary hypertension, unspecified: Secondary | ICD-10-CM | POA: Diagnosis not present

## 2020-11-01 DIAGNOSIS — J9601 Acute respiratory failure with hypoxia: Secondary | ICD-10-CM

## 2020-11-01 DIAGNOSIS — N179 Acute kidney failure, unspecified: Secondary | ICD-10-CM | POA: Diagnosis not present

## 2020-11-01 LAB — RENAL FUNCTION PANEL
Albumin: 3.6 g/dL (ref 3.5–5.0)
Albumin: 3.2 g/dL — ABNORMAL LOW (ref 3.5–5.0)
Anion gap: 12 (ref 5–15)
Anion gap: 11 (ref 5–15)
BUN: 31 mg/dL — ABNORMAL HIGH (ref 8–23)
BUN: 21 mg/dL (ref 8–23)
CO2: 27 mmol/L (ref 22–32)
CO2: 26 mmol/L (ref 22–32)
Calcium: 7.7 mg/dL — ABNORMAL LOW (ref 8.9–10.3)
Calcium: 7.8 mg/dL — ABNORMAL LOW (ref 8.9–10.3)
Chloride: 100 mmol/L (ref 98–111)
Chloride: 99 mmol/L (ref 98–111)
Creatinine, Ser: 2.89 mg/dL — ABNORMAL HIGH (ref 0.44–1.00)
Creatinine, Ser: 2.04 mg/dL — ABNORMAL HIGH (ref 0.44–1.00)
GFR, Estimated: 18 mL/min — ABNORMAL LOW (ref >60)
GFR, Estimated: 27 mL/min — ABNORMAL LOW (ref >60)
Glucose, Bld: 128 mg/dL — ABNORMAL HIGH (ref 70–99)
Glucose, Bld: 110 mg/dL — ABNORMAL HIGH (ref 70–99)
Phosphorus: 3.9 mg/dL (ref 2.5–4.6)
Phosphorus: 3.4 mg/dL (ref 2.5–4.6)
Potassium: 3.8 mmol/L (ref 3.5–5.1)
Potassium: 4.6 mmol/L (ref 3.5–5.1)
Sodium: 139 mmol/L (ref 135–145)
Sodium: 136 mmol/L (ref 135–145)

## 2020-11-01 LAB — COOXEMETRY PANEL
Carboxyhemoglobin: 1.1 % (ref 0.5–1.5)
Methemoglobin: 1 % (ref 0.0–1.5)
O2 Saturation: 82 %
Total hemoglobin: 12.6 g/dL (ref 12.0–16.0)

## 2020-11-01 LAB — MAGNESIUM
Magnesium: 2.3 mg/dL (ref 1.7–2.4)
Magnesium: 2.2 mg/dL (ref 1.7–2.4)

## 2020-11-01 LAB — POCT I-STAT 7, (LYTES, BLD GAS, ICA,H+H)
Acid-Base Excess: 0 mmol/L (ref 0.0–2.0)
Bicarbonate: 29.9 mmol/L — ABNORMAL HIGH (ref 20.0–28.0)
Calcium, Ion: 1.07 mmol/L — ABNORMAL LOW (ref 1.15–1.40)
HCT: 41 % (ref 36.0–46.0)
Hemoglobin: 13.9 g/dL (ref 12.0–15.0)
O2 Saturation: 98 %
Patient temperature: 33.3
Potassium: 3.8 mmol/L (ref 3.5–5.1)
Sodium: 139 mmol/L (ref 135–145)
TCO2: 32 mmol/L (ref 22–32)
pCO2 arterial: 61.5 mmHg — ABNORMAL HIGH (ref 32.0–48.0)
pH, Arterial: 7.275 — ABNORMAL LOW (ref 7.350–7.450)
pO2, Arterial: 106 mmHg (ref 83.0–108.0)

## 2020-11-01 LAB — CBC
HCT: 42.1 % (ref 36.0–46.0)
Hemoglobin: 12.1 g/dL (ref 12.0–15.0)
MCH: 26.6 pg (ref 26.0–34.0)
MCHC: 28.7 g/dL — ABNORMAL LOW (ref 30.0–36.0)
MCV: 92.5 fL (ref 80.0–100.0)
Platelets: 247 10*3/uL (ref 150–400)
RBC: 4.55 MIL/uL (ref 3.87–5.11)
RDW: 16.9 % — ABNORMAL HIGH (ref 11.5–15.5)
WBC: 15.2 10*3/uL — ABNORMAL HIGH (ref 4.0–10.5)
nRBC: 0 % (ref 0.0–0.2)

## 2020-11-01 LAB — GLUCOSE, CAPILLARY
Glucose-Capillary: 101 mg/dL — ABNORMAL HIGH (ref 70–99)
Glucose-Capillary: 112 mg/dL — ABNORMAL HIGH (ref 70–99)
Glucose-Capillary: 114 mg/dL — ABNORMAL HIGH (ref 70–99)
Glucose-Capillary: 114 mg/dL — ABNORMAL HIGH (ref 70–99)
Glucose-Capillary: 125 mg/dL — ABNORMAL HIGH (ref 70–99)

## 2020-11-01 LAB — PHOSPHORUS: Phosphorus: 3.3 mg/dL (ref 2.5–4.6)

## 2020-11-01 LAB — HEPARIN LEVEL (UNFRACTIONATED): Heparin Unfractionated: >2.2 IU/mL — ABNORMAL HIGH (ref 0.30–0.70)

## 2020-11-01 LAB — APTT
aPTT: 195 seconds (ref 24–36)
aPTT: 127 seconds — ABNORMAL HIGH (ref 24–36)
aPTT: 66 seconds — ABNORMAL HIGH (ref 24–36)

## 2020-11-01 LAB — AMMONIA: Ammonia: 23 umol/L (ref 9–35)

## 2020-11-01 MED ORDER — INSULIN ASPART 100 UNIT/ML ~~LOC~~ SOLN
0.0000 [IU] | SUBCUTANEOUS | Status: DC
  Administered 2020-11-01 – 2020-11-02 (×2): 1 [IU] via SUBCUTANEOUS

## 2020-11-01 MED ORDER — FUROSEMIDE 10 MG/ML IJ SOLN
120.0000 mg | Freq: Once | INTRAVENOUS | Status: AC
  Administered 2020-11-01: 120 mg via INTRAVENOUS
  Filled 2020-11-01: qty 10

## 2020-11-01 MED ORDER — METOLAZONE 5 MG PO TABS
5.0000 mg | ORAL_TABLET | Freq: Once | ORAL | Status: AC
  Administered 2020-11-01: 5 mg via ORAL
  Filled 2020-11-01: qty 1

## 2020-11-01 MED ORDER — VITAL HIGH PROTEIN PO LIQD
1000.0000 mL | ORAL | Status: DC
  Administered 2020-11-01: 1000 mL

## 2020-11-01 MED ORDER — PROSOURCE TF PO LIQD
45.0000 mL | Freq: Two times a day (BID) | ORAL | Status: DC
  Administered 2020-11-01 – 2020-11-02 (×3): 45 mL
  Filled 2020-11-01 (×3): qty 45

## 2020-11-01 MED ORDER — PRISMASOL BGK 0/2.5 32-2.5 MEQ/L EC SOLN
Status: DC
  Filled 2020-11-01 (×11): qty 5000

## 2020-11-01 MED ORDER — SODIUM CHLORIDE 0.9 % IV SOLN
2.0000 g | Freq: Every day | INTRAVENOUS | Status: DC
  Administered 2020-11-02: 2 g via INTRAVENOUS
  Filled 2020-11-01: qty 2

## 2020-11-01 MED ORDER — CALCIUM GLUCONATE-NACL 1-0.675 GM/50ML-% IV SOLN
1.0000 g | Freq: Once | INTRAVENOUS | Status: AC
  Administered 2020-11-01: 1000 mg via INTRAVENOUS
  Filled 2020-11-01: qty 50

## 2020-11-01 NOTE — Progress Notes (Signed)
Advanced Heart Failure Rounding Note   Subjective:    Sedated on vent. On NE and milrinone. On CVVHD pulling -50 to -100. Urine output has fallen off.   Still with occasional myoclonic jerking. EEG negative for seizures but shows diffuse encephalopathy.   CVP 15-16  Co-ox 82% NE 10 milrinone 0.125   Objective:   Weight Range:  Vital Signs:   Temp:  [91.6 F (33.1 C)-97.9 F (36.6 C)] 97.3 F (36.3 C) (11/25 0815) Pulse Rate:  [57-93] 83 (11/25 0815) Resp:  [12-26] 20 (11/25 0815) BP: (65-153)/(31-79) 103/55 (11/25 0815) SpO2:  [85 %-99 %] 95 % (11/25 0815) FiO2 (%):  [80 %-100 %] 100 % (11/25 0400) Weight:  [081 kg] 117 kg (11/25 0446) Last BM Date:  (PTA)  Weight change: Filed Weights   10/25/2020 2300 10/31/20 0333 11/01/20 0446  Weight: 116.2 kg 115.9 kg 117 kg    Intake/Output:   Intake/Output Summary (Last 24 hours) at 11/01/2020 0820 Last data filed at 11/01/2020 0700 Gross per 24 hour  Intake 1638.59 ml  Output 2537 ml  Net -898.41 ml     Physical Exam: General:  Obese woman intubated/sedated HEENT: normal Neck: supple. RIJ trialysis LIJ central line. Carotids 2+ bilat; no bruits. No lymphadenopathy or thryomegaly appreciated. Cor: PMI nondisplaced. Regular rate & rhythm. No rubs, gallops or murmurs. Lungs: coarse Abdomen:  Markedly obese soft, nontender, + distended. No hepatosplenomegaly. No bruits or masses. Good bowel sounds. Extremities: no cyanosis, clubbing, rash, 3+ edema  Petechial rash on left hand Neuro: sedated. Occasional myoclonic jerking  Telemetry: NSR 80-90 Personally reviewed   Labs: Basic Metabolic Panel: Recent Labs  Lab 10/31/2020 1611 10/21/2020 1611 10/08/2020 2328 10/25/2020 2328 10/31/20 0741 10/31/20 1600 10/31/20 1734 11/01/20 0051 11/01/20 0202 11/01/20 0425  NA 137   < > 137   < > 139 139 139  --  139 139  K 4.3   < > 4.2   < > 4.0 4.2 4.1  --  3.8 3.8  CL 95*  --  98  --   --   --  96*  --   --  100  CO2 27  --   23  --   --   --  28  --   --  27  GLUCOSE 121*  --  168*  --   --   --  144*  --   --  128*  BUN 53*  --  55*  --   --   --  51*  --   --  31*  CREATININE 4.87*  --  4.81*  --   --   --  4.68*  --   --  2.89*  CALCIUM 7.9*   < > 7.8*  --   --   --  7.8*  --   --  7.7*  MG  --   --   --   --   --   --   --  2.3  --  2.2  PHOS  --   --   --   --   --   --  6.5*  --   --  3.9   < > = values in this interval not displayed.    Liver Function Tests: Recent Labs  Lab 10/24/2020 2146 10/31/20 1734 11/01/20 0425  AST 18  --   --   ALT 8  --   --   ALKPHOS 72  --   --  BILITOT 1.4*  --   --   PROT 7.2  --   --   ALBUMIN 3.9 3.7 3.6   No results for input(s): LIPASE, AMYLASE in the last 168 hours. Recent Labs  Lab 10/31/20 1956  AMMONIA 21    CBC: Recent Labs  Lab 10/15/2020 1611 10/11/2020 1611 10/31/20 0741 10/31/20 1600 10/31/20 1956 11/01/20 0202 11/01/20 0425  WBC 7.1  --   --   --  10.2  --  15.2*  HGB 11.9*   < > 12.2 12.2 12.1 13.9 12.1  HCT 41.1   < > 36.0 36.0 41.7 41.0 42.1  MCV 93.4  --   --   --  92.1  --  92.5  PLT 209  --   --   --  239  --  247   < > = values in this interval not displayed.    Cardiac Enzymes: No results for input(s): CKTOTAL, CKMB, CKMBINDEX, TROPONINI in the last 168 hours.  BNP: BNP (last 3 results) Recent Labs    04/16/20 2301 05/01/20 1300 10/19/2020 1628  BNP 1,641.1* 2,032.8* 468.3*    ProBNP (last 3 results) Recent Labs    12/14/19 1400  PROBNP 905.0*      Other results:  Imaging: DG Abd 1 View  Result Date: 10/31/2020 CLINICAL DATA:  64 year old female status post intubation and feeding tube placement. EXAM: PORTABLE CHEST 1 VIEW COMPARISON:  Chest radiograph dated 10/31/2020. FINDINGS: Suboptimally visualized tip of the endotracheal tube likely at the level of the carina or tilting into the right mainstem bronchus. Recommend retraction by approximately 4-5 cm for optimal positioning. Right IJ dialysis catheter and  left IJ central venous line in similar position. Interval placement of a feeding tube with tip likely in the mid to distal stomach. Near complete opacification of the left hemithorax, progressed since the prior radiograph. Diffuse vascular and interstitial prominence of the right lung. There is no pneumothorax. The cardiac borders are silhouetted. Mildly dilated air-filled loops of small bowel noted in the upper abdomen. IMPRESSION: 1. Suboptimally visualized tip of the endotracheal tube likely at the level of the carina or tilting into the right mainstem bronchus. Recommend retraction by approximately 4-5 cm for optimal positioning. 2. Feeding tube with tip in the mid to distal stomach. These results were called by telephone at the time of interpretation on 10/31/2020 at 9:22 pm to the charge nurse, Annice Pih, who verbally acknowledged these results. Electronically Signed   By: Anner Crete M.D.   On: 10/31/2020 21:31   DG Abd 1 View  Result Date: 10/31/2020 CLINICAL DATA:  NG tube placement EXAM: ABDOMEN - 1 VIEW COMPARISON:  07/04/2010, 10/31/2020 FINDINGS: Partially visualized central venous catheter tip over the right atrium. Dense consolidation left lung base with air bronchograms. Esophageal tube tip in the left upper abdomen. IMPRESSION: Esophageal tube tip overlies the left upper quadrant/gastric body region. Dense consolidation in the left lung base. Electronically Signed   By: Donavan Foil M.D.   On: 10/31/2020 21:05   CT HEAD WO CONTRAST  Result Date: 10/31/2020 CLINICAL DATA:  Seizure. EXAM: CT HEAD WITHOUT CONTRAST TECHNIQUE: Contiguous axial images were obtained from the base of the skull through the vertex without intravenous contrast. COMPARISON:  MRI head, dated September 27, 2013 and CT head dated June 17, 2013. FINDINGS: Brain: There is mild cerebral atrophy with widening of the extra-axial spaces and ventricular dilatation. There are areas of decreased attenuation within the white  matter tracts of  the supratentorial brain, consistent with microvascular disease changes. Small chronic right basal ganglia lacunar infarcts are seen. Vascular: No hyperdense vessel or unexpected calcification. Skull: Normal. Negative for fracture or focal lesion. Sinuses/Orbits: Postoperative changes are seen involving the medial wall of the left maxillary sinus. Mild to moderate severity sphenoid sinus, left maxillary sinus and left ethmoid sinus mucosal thickening is also seen. Other: There is mild left posterior parietal scalp soft tissue swelling. IMPRESSION: 1. Mild left posterior parietal scalp soft tissue swelling. 2. No acute intracranial abnormality. 3. Chronic right basal ganglia lacunar infarcts. 4. Mild to moderate severity pansinus disease. Electronically Signed   By: Virgina Norfolk M.D.   On: 10/31/2020 15:21   US RENAL  Result Date: 10/08/2020 CLINICAL DATA:  64 year old female with acute renal insufficiency. EXAM: RENAL / URINARY TRACT ULTRASOUND COMPLETE COMPARISON:  None. FINDINGS: Evaluation is limited due to body habitus. Right Kidney: Renal measurements: 9.5 x 4.8 x 4.7 cm = volume: 112 mL. Normal echogenicity. No hydronephrosis or shadowing stone. Left Kidney: Renal measurements: 11.0 x 5.2 x 5.7 cm = volume: 170 mL. The left kidney is poorly visualized. No definite hydronephrosis or shadowing stone. Bladder: Not seen. Other: None. IMPRESSION: No hydronephrosis or shadowing stone. Electronically Signed   By: Anner Crete M.D.   On: 10/16/2020 20:57   DG CHEST PORT 1 VIEW  Result Date: 11/01/2020 CLINICAL DATA:  Read just endotracheal tube EXAM: PORTABLE CHEST 1 VIEW COMPARISON:  10/31/2020 FINDINGS: Endotracheal tube tip now measuring about 1.7 cm above the carina. Bilateral central venous catheters and enteric tube are unchanged in position. Generator pack projected over the left axilla. Complete opacification of the left hemithorax may indicate effusion, consolidation,  and/or atelectasis. Increasing airspace infiltration in the right lung since prior study. No pneumothorax. IMPRESSION: 1. Endotracheal tube tip now measuring 1.7 cm above the carina. 2. Complete opacification of the left hemithorax with increasing airspace disease in the right lung. Electronically Signed   By: Lucienne Capers M.D.   On: 11/01/2020 01:29   DG CHEST PORT 1 VIEW  Result Date: 10/31/2020 CLINICAL DATA:  64 year old female status post intubation and feeding tube placement. EXAM: PORTABLE CHEST 1 VIEW COMPARISON:  Chest radiograph dated 10/31/2020. FINDINGS: Suboptimally visualized tip of the endotracheal tube likely at the level of the carina or tilting into the right mainstem bronchus. Recommend retraction by approximately 4-5 cm for optimal positioning. Right IJ dialysis catheter and left IJ central venous line in similar position. Interval placement of a feeding tube with tip likely in the mid to distal stomach. Near complete opacification of the left hemithorax, progressed since the prior radiograph. Diffuse vascular and interstitial prominence of the right lung. There is no pneumothorax. The cardiac borders are silhouetted. Mildly dilated air-filled loops of small bowel noted in the upper abdomen. IMPRESSION: 1. Suboptimally visualized tip of the endotracheal tube likely at the level of the carina or tilting into the right mainstem bronchus. Recommend retraction by approximately 4-5 cm for optimal positioning. 2. Feeding tube with tip in the mid to distal stomach. These results were called by telephone at the time of interpretation on 10/31/2020 at 9:22 pm to the charge nurse, Annice Pih, who verbally acknowledged these results. Electronically Signed   By: Anner Crete M.D.   On: 10/31/2020 21:31   DG CHEST PORT 1 VIEW  Result Date: 10/31/2020 CLINICAL DATA:  64 year old female intubated. EXAM: PORTABLE CHEST 1 VIEW COMPARISON:  0442 hours today. FINDINGS: Portable AP supine view at  1430 hours. Endotracheal tube tip at the level the clavicles. Left IJ approach central line and right IJ approach dual lumen catheter have been placed. Patient is rotated to the left, catheters appear to terminates at the lower SVC level and/or right atrium. No pneumothorax identified. Chronic cardiomegaly with continued dense opacification of the left lung. Left pleural effusion is possible but not certain. Stable right lung. IMPRESSION: 1. Endotracheal tube tip in good position at the level of the clavicles. 2. Left IJ central line and right IJ approach dual lumen catheter have been placed with no adverse features. 3. Cardiomegaly with continued dense opacification of the left mid and lower lung. Electronically Signed   By: Genevie Ann M.D.   On: 10/31/2020 15:25   DG Chest Port 1 View  Result Date: 10/31/2020 CLINICAL DATA:  Congestive heart failure EXAM: PORTABLE CHEST 1 VIEW COMPARISON:  10/24/2020 FINDINGS: Generator pack in the left axillary region. Shallow inspiration. Prominent cardiac enlargement. Perihilar infiltrates likely representing edema. No definite pleural effusion. Aortic calcification. Similar appearance to previous study. IMPRESSION: Cardiac enlargement with perihilar edema.  No significant change. Electronically Signed   By: Lucienne Capers M.D.   On: 10/31/2020 05:53   DG Chest Port 1 View  Result Date: 10/13/2020 CLINICAL DATA:  Shortness of breath EXAM: PORTABLE CHEST 1 VIEW COMPARISON:  09/12/2020 FINDINGS: Cardiomegaly with vascular congestion and mild hazy interstitial opacities suspicious for pulmonary edema. Suspected left pleural effusion. Aortic atherosclerosis. No pneumothorax. IMPRESSION: Cardiomegaly with vascular congestion and mild hazy interstitial opacities suspicious for pulmonary edema. Suspect left pleural effusion. Electronically Signed   By: Donavan Foil M.D.   On: 10/31/2020 19:45   ECHOCARDIOGRAM COMPLETE  Result Date: 10/31/2020    ECHOCARDIOGRAM REPORT    Patient Name:   [name] Date of Exam: 10/31/2020 Medical Rec #:  [id number]              Height:       62.0 in Accession #:    [id number]             Weight:       255.5 lb Date of Birth:  [birth year]              BSA:          2.121 m Patient Age:    3 years               BP:           104/58 mmHg Patient Gender: F                      HR:           92 bpm. Exam Location:  Inpatient Procedure: 2D Echo Indications:    CHF 428.31  History:        Patient has prior history of Echocardiogram examinations, most                 recent 01/04/2020. CHF, CAD, COPD; Risk Factors:Hypertension,                 Dyslipidemia, Diabetes and Former Smoker. CVA. pulmonary                 hypertension.  Sonographer:    Jannett Celestine RDCS (AE) Referring Phys: [id number] Lequita Halt  Sonographer Comments: Technically difficult study due to poor echo windows, suboptimal apical window, suboptimal parasternal window, patient is morbidly obese and echo performed with patient  supine and on artificial respirator. Image acquisition challenging due to patient body habitus. restricted mobility IMPRESSIONS  1. Left ventricular ejection fraction, by estimation, is 40 to 45%. The left ventricle has mildly decreased function. The left ventricle demonstrates global hypokinesis. The left ventricular internal cavity size was not well visualized. There is not well visualized left ventricular hypertrophy. Left ventricular diastolic function could not be evaluated.  2. Right ventricular systolic function is moderately reduced. The right ventricular size is moderately enlarged.  3. A small pericardial effusion is present.  4. The mitral valve is abnormal. not well visualized mitral valve regurgitation.  5. Tricuspid valve regurgitation not well visualized.  6. The aortic valve was not well visualized. Aortic valve regurgitation not well viisualized.  7. Pulmonic valve regurgitation not assessed. Conclusion(s)/Recommendation(s): Almost no  parasternal or apical windows, very limited views. RV and LV function appear mild-moderately reduced. There is the appearance of small pericardial effusion but unable to fully assess extent or presence of tamponade givel limited views. FINDINGS  Left Ventricle: Left ventricular ejection fraction, by estimation, is 40 to 45%. The left ventricle has mildly decreased function. The left ventricle demonstrates global hypokinesis. The left ventricular internal cavity size was not well visualized. There is not well visualized left ventricular hypertrophy. Left ventricular diastolic function could not be evaluated. Right Ventricle: The right ventricular size is moderately enlarged. Right vetricular wall thickness was not well visualized. Right ventricular systolic function is moderately reduced. Left Atrium: Left atrial size was not well visualized. Right Atrium: Right atrial size was not well visualized. Pericardium: A small pericardial effusion is present. Mitral Valve: The mitral valve is abnormal. There is moderate calcification of the mitral valve leaflet(s). Not well visualized mitral valve regurgitation. Tricuspid Valve: The tricuspid valve is not well visualized. Tricuspid valve regurgitation not well visualized. Aortic Valve: The aortic valve was not well visualized. Aortic valve regurgitation not well viisualized. Pulmonic Valve: The pulmonic valve was not well visualized. Pulmonic valve regurgitation not assessed. Aorta: The aortic root was not well visualized, the ascending aorta was not well visualized and the aortic arch was not well visualized. Venous: IVC assessment for right atrial pressure unable to be performed due to mechanical ventilation. IAS/Shunts: The interatrial septum was not well visualized. Buford Dresser MD Electronically signed by Buford Dresser MD Signature Date/Time: 10/31/2020/7:51:27 PM    Final       Medications:     Scheduled Medications: . chlorhexidine gluconate  (MEDLINE KIT)  15 mL Mouth Rinse BID  . Chlorhexidine Gluconate Cloth  6 each Topical Daily  . insulin aspart  0-5 Units Subcutaneous QHS  . insulin aspart  0-9 Units Subcutaneous Q4H  . mouth rinse  15 mL Mouth Rinse BID  . mouth rinse  15 mL Mouth Rinse 10 times per day  . midazolam  2 mg Intravenous Once  . pantoprazole sodium  40 mg Per Tube Daily  . rosuvastatin  10 mg Per Tube QHS  . sodium chloride flush  3 mL Intravenous Q12H     Infusions: .  prismasol BGK 4/2.5 400 mL/hr at 11/01/20 0229  .  prismasol BGK 4/2.5 300 mL/hr at 11/01/20 0417  . sodium chloride    . cefTRIAXone (ROCEPHIN)  IV Stopped (10/31/20 1401)  . dexmedetomidine (PRECEDEX) IV infusion 0.5 mcg/kg/hr (11/01/20 0700)  . fentaNYL infusion INTRAVENOUS 50 mcg/hr (11/01/20 0700)  . heparin 800 Units/hr (11/01/20 0700)  . milrinone 0.125 mcg/kg/min (11/01/20 0700)  . norepinephrine (LEVOPHED) Adult infusion 10 mcg/min (11/01/20  0700)  . prismasol BGK 4/2.5 2,000 mL/hr at 11/01/20 0733     PRN Medications:  sodium chloride, acetaminophen, albuterol, fentaNYL, heparin, LORazepam, ondansetron (ZOFRAN) IV, sodium chloride flush   Assessment/Plan:   1.Acute onChronic diastolic/RV failure due to NICM - Echo 9/20 EF 25-30% (new). RV severely HK  - Cath 8/20 with minimal CAD - Echo 1/21 EF 45-50% with mod RV dysfunction. Grade 2 DD. MV is thickened with mild MR/MS - RHC 7/21 with mixed moderate pulmonary HTN - Echo 10/11/20 EF 55-60% RV severely HK.  - admitted for a/c CHF w/ marked fluid overloaded in the setting of end-stage RV failure and worsening CKD. Failed escalation of home IV diuretics. SCr 4.8 on admit (baseline 1.9-2.0).  - started on CVVHD on 10/31/20.  - on milrinone 0.125 and NE 10  Co-ox 82% - still making urine. As not long-term HD candidate will attempt to minimize CVVHD and diurese with IV lasix. Give lasix 120 IV and metolazone. If responds will start lasix gtt at 30/hr - Holding Jardiance  and hydral - No ARB/ARNi w/ CKD  2. AKI on CKD - baseline SCr ~1.9-20 - SCr up to 4.8 on admit, BUN 55 and oliguric. K 4.0 - started on CVVHD on 10/31/20.  - still making urine. As not long-term HD candid will attempt to minimize CVVHD and diurese with IV lasixa s Jethro Poling - appreciate Renal input  3. Acute on chronic hypoxic respiratory failure - Follows with Dr. Valeta Harms in Pulmonary. Has COPD and OSA/OHS - intubated 10/31/20 - CCM following. D/w Dr. Abigail Butts this am - not ready for extubation yet  4. PAH, mild to moderate - likely who GROUP II & III - RHC 7/21 with mixed moderate pulmonary HTN - not candidate for selective pulmonary vasodilators -needs diuresis/ volume removal per above - Once better diuresed, would benefit from repeat RHC, PFTs and V/Q scan   5. DM2 - recent hgb A1c 6.1  - management per primary team   6. Paroxysmal Atrial Fibrillation  - in NSR currently  - Hold Eliquis>>transition to IV heparin in case of procedures  10. UTI + UA. Check Urine cultures and blood cultures  - abx per IM  - check CBC   11. Confusion/ AMS - d/w IM. Suspect metabolic encephalopathy from polypharmacy in setting of AKI. They are adjusting meds.  - check ABG - treat UTI w/ abx. Check blood cultures    - check ammonia  CRITICAL CARE Performed by: Glori Bickers  Total critical care time: 45 minutes  Critical care time was exclusive of separately billable procedures and treating other patients.  Critical care was necessary to treat or prevent imminent or life-threatening deterioration.  Critical care was time spent personally by me (independent of midlevel providers or residents) on the following activities: development of treatment plan with patient and/or surrogate as well as nursing, discussions with consultants, evaluation of patient's response to treatment, examination of patient, obtaining history from patient or surrogate, ordering and performing  treatments and interventions, ordering and review of laboratory studies, ordering and review of radiographic studies, pulse oximetry and re-evaluation of patient's condition.     Length of Stay: 2   Glori Bickers MD 11/01/2020, 8:20 AM  Advanced Heart Failure Team Pager [phone] (M-F; 7a - 4p)  Please contact [facility] for night-coverage after hours (4p -7a ) and weekends on [email or web address]

## 2020-11-01 NOTE — Progress Notes (Addendum)
[facility] Progress Note Patient Name: [name] DOB: Jul 13, 1956 MRN: [id number]   Date of Service  11/01/2020  HPI/Events of Note  Called for hypoxia. Events noted, ETT was retracted by around 3 cm earlier, post intubation. Sats dropped to 87. Seen on camera, HR and BP stable and not agitated or uncomfortable. Pressor needs unchanged.   eICU Interventions  On 100%, peep 5 - O2 sat was 88/ Increased peep to 10. Sat now 93. CXR done, pending. RN and RT note bloody secretions, some noted in ETT. I am told she has good breath sounds, and suction cath passes easily. Will await CXR and review again.      Intervention Category Major Interventions: Respiratory failure - evaluation and management  Laquinton Bihm G Aj [place] 11/01/2020, 1:25 AM   1:50 am -  CXR with ETT around 2cm above carina O2 sat is 99 on peep 10 ABG being done

## 2020-11-01 NOTE — Plan of Care (Signed)
  Problem: Cardiac: Goal: Ability to achieve and maintain adequate cardiopulmonary perfusion will improve Outcome: Progressing   Problem: Clinical Measurements: Goal: Ability to maintain clinical measurements within normal limits will improve Outcome: Progressing Goal: Will remain free from infection Outcome: Progressing Goal: Diagnostic test results will improve Outcome: Progressing Goal: Cardiovascular complication will be avoided Outcome: Progressing   Problem: Nutrition: Goal: Adequate nutrition will be maintained Outcome: Progressing   Problem: Elimination: Goal: Will not experience complications related to bowel motility Outcome: Progressing Goal: Will not experience complications related to urinary retention Outcome: Progressing   Problem: Safety: Goal: Ability to remain free from injury will improve Outcome: Progressing   Problem: Skin Integrity: Goal: Risk for impaired skin integrity will decrease Outcome: Progressing   Problem: Education: Goal: Ability to demonstrate management of disease process will improve 11/01/2020 1639 by Osborne Oman, RN Outcome: Not Progressing 11/01/2020 1637 by Osborne Oman, RN Outcome: Not Progressing Goal: Ability to verbalize understanding of medication therapies will improve 11/01/2020 1639 by Osborne Oman, RN Outcome: Not Progressing 11/01/2020 1637 by Osborne Oman, RN Outcome: Not Progressing   Problem: Activity: Goal: Capacity to carry out activities will improve 11/01/2020 1639 by Osborne Oman, RN Outcome: Not Progressing 11/01/2020 1637 by Osborne Oman, RN Outcome: Not Progressing   Problem: Education: Goal: Knowledge of General Education information will improve Description: Including pain rating scale, medication(s)/side effects and non-pharmacologic comfort measures Outcome: Not Progressing   Problem: Health Behavior/Discharge Planning: Goal: Ability to manage  health-related needs will improve Outcome: Not Progressing   Problem: Clinical Measurements: Goal: Respiratory complications will improve Outcome: Not Progressing   Problem: Activity: Goal: Risk for activity intolerance will decrease Outcome: Not Progressing   Problem: Coping: Goal: Level of anxiety will decrease Outcome: Not Progressing   Problem: Pain Managment: Goal: General experience of comfort will improve Outcome: Not Progressing   Problem: Education: Goal: Knowledge of disease and its progression will improve Outcome: Not Progressing

## 2020-11-01 NOTE — Progress Notes (Signed)
ANTICOAGULATION CONSULT NOTE - Initial Consult  Pharmacy Consult for Heparin Indication: atrial fibrillation  Allergies  Allergen Reactions  . Atorvastatin Nausea And Vomiting and Other (See Comments)    "legs cramped; moderate to almost severe"- also  . Ibuprofen Other (See Comments)    Per pt, MD doesn't want pt to take this   . Metformin And Related Nausea And Vomiting  . Ace Inhibitors Cough and Nausea And Vomiting  . Hydrocodone-Acetaminophen Nausea And Vomiting  . Albuterol Other (See Comments)    Caused SVT, but pt is currently using it as needed    Patient Measurements: Height: _0  (157.5 cm) Weight: 117 kg (257 lb 15 oz) IBW/kg (Calculated) : 50.1 Heparin Dosing Weight: 79.5 kg  Vital Signs: Temp: 98.1 F (36.7 C) (11/25 1045) Temp Source: Esophageal (11/25 0800) BP: 98/51 (11/25 1045) Pulse Rate: 86 (11/25 1045)  Labs: Recent Labs    10/21/2020 1611 10/31/2020 1611 10/22/2020 2328 10/31/20 0741 10/31/20 1600 10/31/20 1734 10/31/20 1956 10/31/20 1956 11/01/20 0051 11/01/20 0202 11/01/20 0425 11/01/20 1007  HGB 11.9*  --   --    < >   < >  --  12.1   < >  --  13.9 12.1  --   HCT 41.1  --   --    < >   < >  --  41.7  --   --  41.0 42.1  --   PLT 209  --   --   --   --   --  239  --   --   --  247  --   APTT  --   --   --   --   --   --   --   --  195*  --   --  127*  HEPARINUNFRC  --   --   --   --   --   --   --   --  >2.20*  --   --   --   CREATININE 4.87*   < > 4.81*  --   --  4.68*  --   --   --   --  2.89*  --    < > = values in this interval not displayed.    Estimated Creatinine Clearance: 24.2 mL/min (A) (by C-G formula based on SCr of 2.89 mg/dL (H)).   Medical History: Past Medical History:  Diagnosis Date  . Anxiety    doesn't take any meds for this  . Arteriosclerosis of coronary artery 08/31/2011   Myoview 9/12: EF 44, no ischemia // Myoview 8/18: EF 57, low risk, possible small area of inferior ischemia // Minor nonobstructive CAD by  cardiac catheterization - [facility] 8/18: Proximal LAD 35, OM2 25  . Arthritis    knuckles  . Asthma   . Carotid artery disease ([place])    Carotid US 7/14: Bilateral ICA 40-59 // Carotid US ([facility]) 2/17: bilat plaque without sig stenosis  . Chronic diastolic CHF (congestive heart failure) ([place]) 07/26/2017   Echo 7/18:Severe conc LVH, EF 60-65, no RWMA, Gr 2 DD, MAC, mild MR // right and left heart cath 8/18: LVEDP 21  . Claustrophobia 07/26/2012  . COPD (chronic obstructive pulmonary disease) ([place])    "chronic bronchitis - about every winter"  . CSF leak    dizziness/headache - assoc symptoms // 2/2 encephalocele s/p skull base reconstruction  . Diverticulosis   . Dyspnea   . History of CVA (cerebrovascular accident)  03/2004   denies residual 07/26/2012  . Hx of gout   . Hyperlipidemia    takes Pravastatin daily  . Hypertensive heart disease with chronic diastolic congestive heart failure ([place]) 07/26/2017  . Internal hemorrhoids   . Kidney stones   . LVH (left ventricular hypertrophy)    Echo 8/18: Severe conc LVH, EF 60-65, no RWMA, Gr 2 DD, MAC, mild MR  . OSA (obstructive sleep apnea) 07/26/2012   prior CPAP - stopped due to being primary caregiver for dying parent (stopped in 2016)  . PSVT (paroxysmal supraventricular tachycardia) ([place])   . Pulmonary hypertension, unspecified ([place]) 08/14/2017   cath 07/2017 >>PASP 55m Hg, PA mean 39 mm Hg  . Type II diabetes mellitus [place]     Assessment: Pt is a 89YOF with a history of Afib (on apixaban 5 mg BID PTA, last dose 11/23). She is admitted for worsening CKD and being treated for a UTI. Pharmacy has been consulted to start heparin given current renal dysfunction and possible procedures.   APTT remains supratherapeutic at 127 seconds after decreasing drip rate to 800 units/hr. Confirmed with RN labs drawn correctly. No overt bleeding or infusion issues per discussion with nursing. CBC stable with Hgb 12.1, plts 247. Remains on CRRT.  Goal of  Therapy:  Heparin level 0.3-0.7 units/ml aPTT 66-102 seconds Monitor platelets by anticoagulation protocol: Yes   Plan:  Decrease heparin infusion to 600 units/hr Check aPTT in 8 hours Monitor daily HL and aPTT until levels correlate Monitor for bleeding, CBC  CRichardine Service PharmD, BCPS PGY2 Cardiology Pharmacy Resident Phone: [phone]/25/2021  11:15 AM  Please check [email or web address] for unit-specific pharmacy phone numbers.

## 2020-11-01 NOTE — Progress Notes (Signed)
LTM EEG hooked up and running - Skin bruising initial hookup at O1,O2,P7 area RN notifed - push button tested - neuro notified. [facility] monitored

## 2020-11-01 NOTE — Procedures (Signed)
Patient Name: [name]  MRN: [id number]  Epilepsy Attending: Lora Havens  Referring Physician/Provider: Dr Eulogio Bear Date: 10/31/2020 Duration: 25.37 mins   Patient history: 64 year old female with seizure-like episodes.  EEG to evaluate for seizures.  Level of alertness:  Comatose  AEDs during EEG study: Ativan  Technical aspects: This EEG study was done with scalp electrodes positioned according to the 10-20 International system of electrode placement. Electrical activity was acquired at a sampling rate of 500Hz  and reviewed with a high frequency filter of 70Hz  and a low frequency filter of 1Hz . EEG data were recorded continuously and digitally stored.   Description: No clear posterior dominant rhythm was seen.  EEG showed generalized polymorphic predominantly 5 to 7 Hz theta slowing as well as intermittent generalized 2 to 3 Hz delta slowing.  Hyperventilation and photic stimulation were not performed.     ABNORMALITY -Continuous slow, generalized  IMPRESSION: This study is suggestive of moderate to severe diffuse encephalopathy, nonspecific etiology.  No seizures or epileptiform discharges were seen throughout the recording.  [name] [name]

## 2020-11-01 NOTE — Progress Notes (Signed)
Ms. Hellums's niece, Joellen Jersey, called for an update this morning.  We discussed events that occurred overnight.  She then requested a phone call after the physicians rounded to know the goals for [name]'s care.  Dr. Haroldine Laws and Dr. Lynetta Mare rounded on the patient and expressed their concerns for the excess extravascular fluid present and pt's renal function.  Dr. Haroldine Laws ordered Lasix and metolazone to see if pt's UOP would increase.  He also voiced concern for pt's long term outlook if her UOP did not increase.  Katie, pt's niece, was updated with that information via phone and she was encouraged to talk to her mother (pt's sister) about the pt's prognosis because she is the primary contact and next of kin.  We further discussed the possibility of having a family meeting tomorrow (11/26) to discuss goals of care.  Katie verbalized understanding.  All questions answered.  We also discussed pt's wig, concern for damage while pt is bed bound, pt's comfort level without her wig on, and if the family could bring another covering for her head.  Joellen Jersey thanked me and advised that she would bring something in later (likely tomorrow, 11/26).

## 2020-11-01 NOTE — Progress Notes (Addendum)
[facility] Progress Note  Name: [name] MRN: [id number] DOB: 1956/10/08  Chief Complaint:  Directed to come to hospital due to renal failure and fluid overload  Subjective:  Seen and examined on CRRT; procedure supervised. She was started on CRRT yesterday evening after nontunneled dialysis catheter placed on 11/24 per CCM.  She had 1.2 liters UOP over 11/24.  She has had 1 liter UF with CRRT over 11/24 as charted so far.  Note that she self-extubated and had to be reintubated then the tube was repositioned per nursing report.  She was on as much as 16 mcg/min of levo and down to 10 mcg/min now.  She was just increased to 100 ml/hr UF goal a couple of hours ago and has been tolerating well.   Review of systems:  Unable to obtain secondary to mechanical ventilation  ------------- Background on consult:  [name] is a 64 y.o. female is a history of chronic combined systolic and diastolic CHF, chronic hypoxic respiratory failure on home oxygen, CKD, hypertension, and paroxysmal atrial fibrillation who presented to the hospital after labs demonstrated worsening renal function and worsening clinical status.  Note that patient has been receiving IV Lasix at home for heart failure and weight gain.   Per cardiology charting her Creatinine had increased from 2.55 to 4.1 over the course of a couple of days and this was accompanied by a decrease in urine output.  Creatinine trends in the [facility] system are noted below -note for reference creatinine has risen from 1.92 on 09/13/2020 up to 4.8 on her date of presentation.  Nephrology is consulted for assistance with AKI.  Critical care was consulted today as well and is now her primary team.  CHF team and neurology have been consulted.  Her sister is at bedside and states that she has always told her family that she would not want long-term dialysis; the patient confirms this.  The patient had a seizure earlier and our  conversation appears to be the most lucid that she has had today per family's report.  At home she has been on 3 L of oxygen and desats to the 70s with movement.  She has had jerking movements and AMS (trouble with recall mostly) and a seizure - all new here at the hospital.  She has been on neurontin at home which has been held for the past couple of days per her sister.  At home she was on metolazone 2.5 mg weekly and torsemide 60 mg daily previously (before the IV lasix?). She had been getting daily IV lasix at home and home monitoring.  Note that she got lasix 160 mg IV early AM on 11/24 with 200 mL UOP charted.  She has been started on milrinone per cardiology (new here per her niece).  Note she was last seen in the office at [facility] 05/17/2020.  Creatinine has been 1.99 on 05/10/2020.  She was seen in the [place] clinic that day and there were plans for 4-month follow-up.  She canceled 2 appointments and did not attend another scheduled visit - stating it was hard to get anywhere or move around.  With regard to her goals of care, she states that she is not ready to call hospice yet and that she is willing to try dialysis in the short-term to help her breathing.  We thoughtfully discussed that we do not know whether or not she would need dialysis short-term or long-term yet at this point.  I  asked that she please continue to consider goals of care and to please let us know if anything changes.  She states that she has seen a former significant other go through dialysis.  We have discussed the risks benefits indications of dialysis and the patient (who is post-ictal) with the support of her sister and family/nieces does want to proceed with dialysis.  The patient does not have a spouse or child or living parent.  Her sister is her closest relative and makes her medical decisions    Intake/Output Summary (Last 24 hours) at 11/01/2020 0607 Last data filed at 11/01/2020 0600 Gross per 24 hour  Intake  1646.81 ml  Output 2354 ml  Net -707.19 ml    Vitals:  Vitals:   11/01/20 0430 11/01/20 0446 11/01/20 0500 11/01/20 0600  BP: 137/60  (!) 139/59 (!) 123/57  Pulse: 71  75 75  Resp: 19  (!) 22 20  Temp: (!) 94.3 F (34.6 C)  (!) 94.8 F (34.9 C) (!) 95.7 F (35.4 C)  TempSrc:      SpO2: 98%  96% 97%  Weight:  117 kg    Height:         Physical Exam:  General: adult female in bed obese habitus chronically ill-appearing, intubated HEENT: Normocephalic atraumatic Neck: Increased neck circumference trachea midline Heart: S1-S2 no rub appreciated Lungs: coarse and occasionally rhonchorous breath sounds Abdomen: softly distended obese habitus Extremities: 2+ hard edema bilateral lower extremities; no cyanosis  Skin: no rash on extremities exposed Neuro: on continuous sedation  GU Foley catheter in place Access: RIJ nontunneled HD catheter   Medications reviewed  Labs:  BMP Latest Ref Rng & Units 11/01/2020 11/01/2020 10/31/2020  Glucose 70 - 99 mg/dL 128(H) - 144(H)  BUN 8 - 23 mg/dL 31(H) - 51(H)  Creatinine 0.44 - 1.00 mg/dL 2.89(H) - 4.68(H)  BUN/Creat Ratio 12 - 28 - - -  Sodium 135 - 145 mmol/L 139 139 139  Potassium 3.5 - 5.1 mmol/L 3.8 3.8 4.1  Chloride 98 - 111 mmol/L 100 - 96(L)  CO2 22 - 32 mmol/L 27 - 28  Calcium 8.9 - 10.3 mg/dL 7.7(L) - 7.8(L)     Assessment/Plan:   # Acute kidney injury  - Secondary to cardiorenal syndrome.  CRRT started on 11/24 to optimize volume and for clearance.   - UF goal 50 to 100 ml/hr as tolerated - She is a poor long-term candidate for dialysis with her functional status, cardiac history, and history of noncompliance; prior to initiating she stated that she would not want long term dialysis but does want to treatment of her acute renal failure in lieu of palliative measures.  Her sister is her closest relative and appears to have the patient's best interests at heart and respects the patient's wish of no long-term dialysis    # Chronic kidney disease stage IV  - Secondary to diabetes, hypertension, as well as cardiorenal syndrome - baseline creatinine is 2 - Note she may have had CKD progression over time with her illness  # Altered mental status - Secondary to post ictal state as well as multiple medications including gabapentin which have been discontinued.  Also in the setting of renal failure - Improved as of 11/24 exam; now intubated  # Acute on chronic combined systolic and diastolic CHF - Optimize volume with CRRT - on milrinone per cardiology/CHF  # Chronic hypoxic respiratory failure  - mechanical ventilation per pulm and optimize volume status as able with  RRT  # Seizure - neurology has been consulted  Claudia Desanctis, MD 11/01/2020 6:27 AM

## 2020-11-01 NOTE — Progress Notes (Signed)
ANTICOAGULATION CONSULT NOTE - Follow Up Consult  Pharmacy Consult for heparin Indication: atrial fibrillation  Labs: Recent Labs    10/24/2020 1611 10/13/2020 2328 10/31/20 0741 10/31/20 1600 10/31/20 1600 10/31/20 1734 10/31/20 1956 11/01/20 0051 11/01/20 0202  HGB 11.9*  --    < > 12.2   < >  --  12.1  --  13.9  HCT 41.1  --    < > 36.0  --   --  41.7  --  41.0  PLT 209  --   --   --   --   --  239  --   --   APTT  --   --   --   --   --   --   --  195*  --   HEPARINUNFRC  --   --   --   --   --   --   --  >2.20*  --   CREATININE 4.87* 4.81*  --   --   --  4.68*  --   --   --    < > = values in this interval not displayed.    Assessment: 64yo female supratherapeutic on heparin with initial dosing while Eliquis on hold; no gtt issues or signs of bleeding per RN other than a small amount of blood during re-intubation.  Goal of Therapy:  aPTT 66-102 seconds   Plan:  Will decrease heparin gtt by 3-4 units/kg/hr to 800 units/hr and check PTT in 8 hours.    Wynona Neat, PharmD, BCPS  11/01/2020,2:10 AM

## 2020-11-01 NOTE — Progress Notes (Signed)
Pt vacillates between calm and reaching for her ET tube.  Unable to D/C restraints d/t pt's unpredictable irritation level.  Pt was also difficult re-intubation on 11/24 after she self-extubated.  Katie, Pt's niece, was informed of same and advised that she understood.  Purpose of restraints was explained to pt, no obvious signs of understanding.

## 2020-11-01 NOTE — Progress Notes (Signed)
Subjective: Per RN, patient has been having intermittent episodes of bilateral upper extremity jerking/twitching.  ROS: Unable to assess due to AMS  Examination  Vital signs in last 24 hours: Temp:  [91.6 F (33.1 C)-98.1 F (36.7 C)] 97.9 F (36.6 C) (11/25 1120) Pulse Rate:  [57-92] 88 (11/25 1120) Resp:  [12-26] 26 (11/25 1120) BP: (65-153)/(31-81) 115/57 (11/25 1120) SpO2:  [85 %-99 %] 92 % (11/25 1120) FiO2 (%):  [80 %-100 %] 80 % (11/25 1120) Weight:  [017 kg] 117 kg (11/25 0446)  General: lying in bed, not in apparent distress CVS: pulse-normal rate and rhythm RS: breathing comfortably, coarse breath sounds bilaterally Extremities: normal, warm petechiae left hand  Neuro: Sedated on fentanyl, does not open eyes to noxious stimuli, pupils equally round reactive, corneal reflex intact, gag reflex intact, does not withdraw to noxious stimuli in extremities, noted to have intermittent bilateral upper extremity myoclonic jerks  Basic Metabolic Panel: Recent Labs  Lab 10/14/2020 1611 10/10/2020 1611 10/25/2020 2328 10/28/2020 2328 10/31/20 0741 10/31/20 1600 10/31/20 1734 11/01/20 0051 11/01/20 0202 11/01/20 0425  NA 137   < > 137   < > 139 139 139  --  139 139  K 4.3   < > 4.2   < > 4.0 4.2 4.1  --  3.8 3.8  CL 95*  --  98  --   --   --  96*  --   --  100  CO2 27  --  23  --   --   --  28  --   --  27  GLUCOSE 121*  --  168*  --   --   --  144*  --   --  128*  BUN 53*  --  55*  --   --   --  51*  --   --  31*  CREATININE 4.87*  --  4.81*  --   --   --  4.68*  --   --  2.89*  CALCIUM 7.9*   < > 7.8*  --   --   --  7.8*  --   --  7.7*  MG  --   --   --   --   --   --   --  2.3  --  2.2  PHOS  --   --   --   --   --   --  6.5*  --   --  3.9   < > = values in this interval not displayed.    CBC: Recent Labs  Lab 11/03/2020 1611 10/18/2020 1611 10/31/20 0741 10/31/20 1600 10/31/20 1956 11/01/20 0202 11/01/20 0425  WBC 7.1  --   --   --  10.2  --  15.2*  HGB 11.9*   < >  12.2 12.2 12.1 13.9 12.1  HCT 41.1   < > 36.0 36.0 41.7 41.0 42.1  MCV 93.4  --   --   --  92.1  --  92.5  PLT 209  --   --   --  239  --  247   < > = values in this interval not displayed.     Coagulation Studies: No results for input(s): LABPROT, INR in the last 72 hours.  Imaging CT head without contrast 10/31/2017: No acute intracranial abnormality.  Chronic right basal ganglia lacunar infarcts.  Mild left posterior parietal scalp soft tissue swelling.   ASSESSMENT AND PLAN:  65 year old female with multiple  comorbidities as noted in HPI now with seizure-like episodes.  Provoked seizures due to gabapentin toxicity Acute encephalopathy, likely ictal versus toxic-metabolic Acute on chronic systolic and diastolic heart failure AKI on CKD UTI Type 2 diabetes Paroxysmal A. Fib Chronic hypoxic respiratory failure Extreme obesity -Patient on CRRT, however noted to have myoclonic jerks could be stimulation induced myoclonus versus myoclonic seizures in setting of renal dysfunction  Recommendations: -Video EEG for characterization of spells - Will start patient on Keppra 500 mg twice daily if patient is noted to have epileptiform abnormalities -Seizure precaution -As needed IV Ativan 2 mg for clinical seizure lasting more than 2 minutes -Management of rest of comorbidities per primary team  I have spent a total of  35 minutes with the patient reviewing hospital notes,  test results, labs and examining the patient as well as establishing an assessment and plan.  > 50% of time was spent in direct patient care.     Zeb Comfort Epilepsy [facility] For questions after 5pm please refer to AMION to reach the Neurologist on call

## 2020-11-01 NOTE — Consult Note (Signed)
NAME:  [name], MRN:  [id number], DOB:  Mar 12, 1956, LOS: 2 ADMISSION DATE:  10/29/2020, CONSULTATION DATE: 10/31/2020 REFERRING MD: [facility], CHIEF COMPLAINT: Generalized failure to thrive congestive heart failure coupled with worsening renal failure.  Brief History   64 year old female with a plethora of health issues and generalized failure to thrive for the last 2 to 3 months.  History of present illness   64 year old female with a plethora of health issues which are well documented below and are significant for congestive heart failure diastolic systolic, chronic hypoxic respiratory on 3 L nasal cannula continuously documented OSA not sure if she is on CPAP, chronic kidney disease with elevated creatinine 3.8 her baseline is 1.9.  She presented with increasing respiratory distress volume overload treated with IV diuresis. She had poor venous access pulmonary critical care was asked to attempt to place a central line in her 10/31/2020 and on examination she was found to be in a seizure activity.  The family was extremely distraught long conversation per Dr. Tamala Julian and NP Minor concerning goals of care.  She is stated she would not want to be intubated nor have dialysis in the future.  We have offered 3 different pathways 1 being comfort care without further acute interventions.  Second to be attempted intubation central line placement with aggressive diuresis using inotropes as an assist.  Should this fail then she will go to option #1.  Option #3 would be intubation tracheostomy if needed hemodialysis for long-term hemodialysis which they stated they do not want.  Due to the severity of her illness pulmonary critical care will assume her care at this time unless a course of comfort care is assumed.  Past Medical History   Past Medical History:  Diagnosis Date  . Anxiety    doesn't take any meds for this  . Arteriosclerosis of coronary artery 08/31/2011   Myoview 9/12: EF 44, no  ischemia // Myoview 8/18: EF 57, low risk, possible small area of inferior ischemia // Minor nonobstructive CAD by cardiac catheterization - LHC 8/18: Proximal LAD 35, OM2 25  . Arthritis    knuckles  . Asthma   . Carotid artery disease ([place])    Carotid US 7/14: Bilateral ICA 40-59 // Carotid US ([facility]) 2/17: bilat plaque without sig stenosis  . Chronic diastolic CHF (congestive heart failure) ([place]) 07/26/2017   Echo 7/18:Severe conc LVH, EF 60-65, no RWMA, Gr 2 DD, MAC, mild MR // right and left heart cath 8/18: LVEDP 21  . Claustrophobia 07/26/2012  . COPD (chronic obstructive pulmonary disease) ([place])    "chronic bronchitis - about every winter"  . CSF leak    dizziness/headache - assoc symptoms // 2/2 encephalocele s/p skull base reconstruction  . Diverticulosis   . Dyspnea   . History of CVA (cerebrovascular accident) 03/2004   denies residual 07/26/2012  . Hx of gout   . Hyperlipidemia    takes Pravastatin daily  . Hypertensive heart disease with chronic diastolic congestive heart failure ([place]) 07/26/2017  . Internal hemorrhoids   . Kidney stones   . LVH (left ventricular hypertrophy)    Echo 8/18: Severe conc LVH, EF 60-65, no RWMA, Gr 2 DD, MAC, mild MR  . OSA (obstructive sleep apnea) 07/26/2012   prior CPAP - stopped due to being primary caregiver for dying parent (stopped in 2016)  . PSVT (paroxysmal supraventricular tachycardia) ([place])   . Pulmonary hypertension, unspecified ([place]) 08/14/2017   cath 07/2017 >>PASP 31m Hg, PA mean  39 mm Hg  . Type II diabetes mellitus ([place])      Significant Hospital Events   10/31/2020 documented seizure  Consults:  10/31/2020 pulmonary critical care Procedures:    Significant Diagnostic Tests:  11/01/2019 one 2D echo pending  Micro Data:  10/31/2020 urine culture 10/31/2020 blood cultures x2   Antimicrobials:  11/05/2020 ceftriaxone for presumed urinary tract infection>>  Interim history/subjective:  Failed self extubation  yesterday evening.  Reintubated.  Objective   Blood pressure (!) 116/57, pulse 78, temperature 98.1 F (36.7 C), resp. rate 18, height _0  (1.575 m), weight 117 kg, SpO2 94 %. CVP:  [12 mmHg-24 mmHg] 17 mmHg  Vent Mode: PRVC FiO2 (%):  [80 %-100 %] 80 % Set Rate:  [18 bmp] 18 bmp Vt Set:  [400 mL] 400 mL PEEP:  [5 cmH20-10 cmH20] 10 cmH20 Plateau Pressure:  [24 cmH20-28 cmH20] 27 cmH20   Intake/Output Summary (Last 24 hours) at 11/01/2020 1332 Last data filed at 11/01/2020 1202 Gross per 24 hour  Intake 2080.16 ml  Output 3408 ml  Net -1327.84 ml   Filed Weights   10/08/2020 2300 10/31/20 0333 11/01/20 0446  Weight: 116.2 kg 115.9 kg 117 kg   CVP:  [12 mmHg-24 mmHg] 17 mmHg  Examination: General: Morbidly obese female intubated  HENT: Short neck orally intubated. Lungs: Decreased breath sounds throughout bibasilar crackles Cardiovascular: Distant heart sounds. Abdomen: Obese  Extremities: Generalized brawny edema of both legs particularly in dependent areas.  Stasis dermatitis. Neuro: Currently sedated episode of diffuse jerking which subsided GU: Amber urine  Resolved Hospital Problem list     Assessment & Plan:  Acute toxic metabolic encephalopathy Myoclonic movement with AMS intermittent question seizures-no evidence of seizures on EEG Acute on chronic kidney disease question cardiorenal vs. Advancing CKD Class 3 obesity Biventricular decompensated systolic and diastolic heart failure UTI- on ceftriaxone, continue Failure to thrive Groups 2/3 pulmonary HTN (OSA, OHS, diastolic heart failure), not much of transpulmonary gradient, last RHC unfortunately was not at euvolemia  Poor venous access  Plan:  Trial of diuretics as patient does not a good hemodialysis candidate. Unlikely to survive if she does not respond to diuretics. Continue current full mechanical ventilatory support until CVP normalizes.  Best practice (evaluated daily)   Diet:  NPO Pain/Anxiety/Delirium protocol (if indicated): Not indicated VAP protocol (if indicated): If intubated DVT prophylaxis: In place GI prophylaxis: PPI Glucose control: Sliding scale insulin protocol Mobility: Bedrest last date of multidisciplinary goals of care discussion 10/31/2020 niece and sister updated at bedside by RN, NP, Dr. Tamala Julian Family and staff present S. minor ACNP, Dr. Tamala Julian.  Bedside RN Summary of discussion ongoing family discussions concerning CODE STATUS Follow up goals of care discussion due pending at this time Code Status full code Disposition: ICU  Labs   CBC: Recent Labs  Lab 11/03/2020 1611 10/21/2020 1611 10/31/20 0741 10/31/20 1600 10/31/20 1956 11/01/20 0202 11/01/20 0425  WBC 7.1  --   --   --  10.2  --  15.2*  HGB 11.9*   < > 12.2 12.2 12.1 13.9 12.1  HCT 41.1   < > 36.0 36.0 41.7 41.0 42.1  MCV 93.4  --   --   --  92.1  --  92.5  PLT 209  --   --   --  239  --  247   < > = values in this interval not displayed.    Basic Metabolic Panel: Recent Labs  Lab 11/06/2020 1611 11/04/2020 1611  10/15/2020 2328 10/12/2020 2328 10/31/20 0741 10/31/20 1600 10/31/20 1734 11/01/20 0051 11/01/20 0202 11/01/20 0425 11/01/20 1110  NA 137   < > 137   < > 139 139 139  --  139 139  --   K 4.3   < > 4.2   < > 4.0 4.2 4.1  --  3.8 3.8  --   CL 95*  --  98  --   --   --  96*  --   --  100  --   CO2 27  --  23  --   --   --  28  --   --  27  --   GLUCOSE 121*  --  168*  --   --   --  144*  --   --  128*  --   BUN 53*  --  55*  --   --   --  51*  --   --  31*  --   CREATININE 4.87*  --  4.81*  --   --   --  4.68*  --   --  2.89*  --   CALCIUM 7.9*  --  7.8*  --   --   --  7.8*  --   --  7.7*  --   MG  --   --   --   --   --   --   --  2.3  --  2.2  --   PHOS  --   --   --   --   --   --  6.5*  --   --  3.9 3.3   < > = values in this interval not displayed.   GFR: Estimated Creatinine Clearance: 24.2 mL/min (A) (by C-G formula based on SCr of 2.89 mg/dL  (H)). Recent Labs  Lab 11/04/2020 1611 10/31/20 1956 11/01/20 0425  WBC 7.1 10.2 15.2*    Liver Function Tests: Recent Labs  Lab 10/21/2020 2146 10/31/20 1734 11/01/20 0425  AST 18  --   --   ALT 8  --   --   ALKPHOS 72  --   --   BILITOT 1.4*  --   --   PROT 7.2  --   --   ALBUMIN 3.9 3.7 3.6   No results for input(s): LIPASE, AMYLASE in the last 168 hours. Recent Labs  Lab 10/31/20 1956 11/01/20 1200  AMMONIA 21 23    ABG    Component Value Date/Time   PHART 7.275 (L) 11/01/2020 0202   PCO2ART 61.5 (H) 11/01/2020 0202   PO2ART 106 11/01/2020 0202   HCO3 29.9 (H) 11/01/2020 0202   TCO2 32 11/01/2020 0202   ACIDBASEDEF 0.8 12/20/2008 0130   O2SAT 82.0 11/01/2020 0425     Coagulation Profile: No results for input(s): INR, PROTIME in the last 168 hours.  Cardiac Enzymes: No results for input(s): CKTOTAL, CKMB, CKMBINDEX, TROPONINI in the last 168 hours.  HbA1C: Hgb A1c MFr Bld  Date/Time Value Ref Range Status  10/12/2020 02:50 PM 6.1 (H) <5.7 % of total Hgb Final    Comment:    For someone without known diabetes, a hemoglobin  A1c value between 5.7% and 6.4% is consistent with prediabetes and should be confirmed with a  follow-up test. . For someone with known diabetes, a value <7% indicates that their diabetes is well controlled. A1c targets should be individualized based on duration of diabetes, age, comorbid conditions, and other  considerations. . This assay result is consistent with an increased risk of diabetes. . Currently, no consensus exists regarding use of hemoglobin A1c for diagnosis of diabetes for children. Marland Kitchen   04/16/2020 11:01 PM 6.3 (H) 4.8 - 5.6 % Final    Comment:    (NOTE) Pre diabetes:          5.7%-6.4% Diabetes:              >6.4% Glycemic control for   <7.0% adults with diabetes     CBG: Recent Labs  Lab 10/31/20 0642 10/31/20 1601 10/31/20 2250 11/01/20 0734 11/01/20 1133  GLUCAP 141* 117* 158* 112* 125*     CRITICAL CARE Performed by: Kipp Brood   Total critical care time: 40 minutes  Critical care time was exclusive of separately billable procedures and treating other patients.  Critical care was necessary to treat or prevent imminent or life-threatening deterioration.  Critical care was time spent personally by me on the following activities: development of treatment plan with patient and/or surrogate as well as nursing, discussions with consultants, evaluation of patient's response to treatment, examination of patient, obtaining history from patient or surrogate, ordering and performing treatments and interventions, ordering and review of laboratory studies, ordering and review of radiographic studies, pulse oximetry, re-evaluation of patient's condition and participation in multidisciplinary rounds.  Kipp Brood, MD [facility] ICU Physician [place]  Pager: [phone] Mobile: [phone] After hours: [phone].   11/01/2020, 1:32 PM

## 2020-11-01 NOTE — Progress Notes (Signed)
[place] for Heparin Indication: atrial fibrillation  Allergies  Allergen Reactions  . Atorvastatin Nausea And Vomiting and Other (See Comments)    "legs cramped; moderate to almost severe"- also  . Ibuprofen Other (See Comments)    Per pt, MD doesn't want pt to take this   . Metformin And Related Nausea And Vomiting  . Ace Inhibitors Cough and Nausea And Vomiting  . Hydrocodone-Acetaminophen Nausea And Vomiting  . Albuterol Other (See Comments)    Caused SVT, but pt is currently using it as needed    Patient Measurements: Height: 5\' 2"  (157.5 cm) Weight: 117 kg (257 lb 15 oz) IBW/kg (Calculated) : 50.1 Heparin Dosing Weight: 79.5 kg  Vital Signs: Temp: 97.7 F (36.5 C) (11/25 2000) Temp Source: Esophageal (11/25 1600) BP: 118/62 (11/25 2000) Pulse Rate: 79 (11/25 2000)  Labs: Recent Labs    11/03/2020 1611 10/31/2020 2328 10/31/20 1600 10/31/20 1734 10/31/20 1956 10/31/20 1956 11/01/20 0051 11/01/20 0202 11/01/20 0425 11/01/20 1007 11/01/20 1617 11/01/20 1926  HGB 11.9*   < >   < >  --  12.1   < >  --  13.9 12.1  --   --   --   HCT 41.1   < >   < >  --  41.7  --   --  41.0 42.1  --   --   --   PLT 209  --   --   --  239  --   --   --  247  --   --   --   APTT  --   --   --   --   --   --  195*  --   --  127*  --  66*  HEPARINUNFRC  --   --   --   --   --   --  >2.20*  --   --   --   --   --   CREATININE 4.87*   < >  --  4.68*  --   --   --   --  2.89*  --  2.04*  --    < > = values in this interval not displayed.    Estimated Creatinine Clearance: 34.3 mL/min (A) (by C-G formula based on SCr of 2.04 mg/dL (H)).  Assessment: Pt is a 24 YOF with a history of Afib (on apixaban 5 mg BID PTA, last dose 11/23). She is admitted for worsening CKD and being treated for a UTI. Pharmacy has been consulted to dose heparin given current renal dysfunction and possible procedures.   APTT decreased to low therapeutic level this evening.   No bleeding reported.  Goal of Therapy:  Heparin level 0.3-0.7 units/ml aPTT 66-102 seconds Monitor platelets by anticoagulation protocol: Yes   Plan:  Increase heparin infusion back up to 700 units/hr to maintain therapeutic level F/U AM labs  Acquanetta Cabanilla D. Mina Marble, PharmD, BCPS, [place] 11/01/2020, 8:20 PM

## 2020-11-01 NOTE — Progress Notes (Signed)
Pt's niece, Joellen Jersey, called this afternoon to see how the pt responded to Lasix administration.  After being told that Ms. Pollina did not have much urine output and overall discussion of Ms. Mccarron's condition, Katie requested to have a family meeting tomorrow (11/26) to discuss goals of care.  Dr. Lynetta Mare was notified and a consult to spiritual care was ordered.  Katie verbalized understanding.  All questions answered.

## 2020-11-02 DIAGNOSIS — I5043 Acute on chronic combined systolic (congestive) and diastolic (congestive) heart failure: Secondary | ICD-10-CM | POA: Diagnosis not present

## 2020-11-02 DIAGNOSIS — N179 Acute kidney failure, unspecified: Secondary | ICD-10-CM | POA: Diagnosis not present

## 2020-11-02 DIAGNOSIS — Z9289 Personal history of other medical treatment: Secondary | ICD-10-CM

## 2020-11-02 DIAGNOSIS — R092 Respiratory arrest: Secondary | ICD-10-CM

## 2020-11-02 DIAGNOSIS — Z66 Do not resuscitate: Secondary | ICD-10-CM

## 2020-11-02 DIAGNOSIS — Z515 Encounter for palliative care: Secondary | ICD-10-CM

## 2020-11-02 LAB — APTT: aPTT: 78 seconds — ABNORMAL HIGH (ref 24–36)

## 2020-11-02 LAB — RENAL FUNCTION PANEL
Albumin: 3.1 g/dL — ABNORMAL LOW (ref 3.5–5.0)
Anion gap: 12 (ref 5–15)
BUN: 14 mg/dL (ref 8–23)
CO2: 26 mmol/L (ref 22–32)
Calcium: 8 mg/dL — ABNORMAL LOW (ref 8.9–10.3)
Chloride: 98 mmol/L (ref 98–111)
Creatinine, Ser: 1.78 mg/dL — ABNORMAL HIGH (ref 0.44–1.00)
GFR, Estimated: 32 mL/min — ABNORMAL LOW (ref >60)
Glucose, Bld: 123 mg/dL — ABNORMAL HIGH (ref 70–99)
Phosphorus: 2.8 mg/dL (ref 2.5–4.6)
Potassium: 4 mmol/L (ref 3.5–5.1)
Sodium: 136 mmol/L (ref 135–145)

## 2020-11-02 LAB — CBC
HCT: 37.7 % (ref 36.0–46.0)
Hemoglobin: 11 g/dL — ABNORMAL LOW (ref 12.0–15.0)
MCH: 27.4 pg (ref 26.0–34.0)
MCHC: 29.2 g/dL — ABNORMAL LOW (ref 30.0–36.0)
MCV: 94 fL (ref 80.0–100.0)
Platelets: 192 10*3/uL (ref 150–400)
RBC: 4.01 MIL/uL (ref 3.87–5.11)
RDW: 17 % — ABNORMAL HIGH (ref 11.5–15.5)
WBC: 13.7 10*3/uL — ABNORMAL HIGH (ref 4.0–10.5)
nRBC: 0 % (ref 0.0–0.2)

## 2020-11-02 LAB — MAGNESIUM: Magnesium: 2.3 mg/dL (ref 1.7–2.4)

## 2020-11-02 LAB — COOXEMETRY PANEL
Carboxyhemoglobin: 1.6 % — ABNORMAL HIGH (ref 0.5–1.5)
Methemoglobin: 1 % (ref 0.0–1.5)
O2 Saturation: 86 %
Total hemoglobin: 11.2 g/dL — ABNORMAL LOW (ref 12.0–16.0)

## 2020-11-02 LAB — URINE CULTURE: Culture: >=100000 — AB

## 2020-11-02 LAB — GLUCOSE, CAPILLARY: Glucose-Capillary: 119 mg/dL — ABNORMAL HIGH (ref 70–99)

## 2020-11-02 LAB — HEPARIN LEVEL (UNFRACTIONATED): Heparin Unfractionated: >2.2 IU/mL — ABNORMAL HIGH (ref 0.30–0.70)

## 2020-11-02 MED ORDER — DEXTROSE 5 % IV SOLN: INTRAVENOUS | Status: DC

## 2020-11-02 MED ORDER — POLYVINYL ALCOHOL 1.4 % OP SOLN
1.0000 [drp] | Freq: Four times a day (QID) | OPHTHALMIC | Status: DC | PRN
  Filled 2020-11-02: qty 15

## 2020-11-02 MED ORDER — MIDAZOLAM 50MG/50ML (1MG/ML) PREMIX INFUSION
0.5000 mg/h | INTRAVENOUS | Status: DC
  Administered 2020-11-02: 5 mg/h via INTRAVENOUS
  Filled 2020-11-02: qty 50

## 2020-11-02 MED ORDER — DIPHENHYDRAMINE HCL 50 MG/ML IJ SOLN: 25.0000 mg | INTRAMUSCULAR | Status: DC | PRN

## 2020-11-02 MED ORDER — MIDAZOLAM BOLUS VIA INFUSION
4.0000 mg | INTRAVENOUS | Status: DC | PRN
  Filled 2020-11-02: qty 4

## 2020-11-02 MED ORDER — VITAL 1.5 CAL PO LIQD: 1000.0000 mL | ORAL | Status: DC

## 2020-11-02 MED ORDER — GLYCOPYRROLATE 0.2 MG/ML IJ SOLN: 0.2000 mg | INTRAMUSCULAR | Status: DC | PRN

## 2020-11-02 MED ORDER — ACETAMINOPHEN 650 MG RE SUPP: 650.0000 mg | Freq: Four times a day (QID) | RECTAL | Status: DC | PRN

## 2020-11-02 MED ORDER — ACETAMINOPHEN 325 MG PO TABS: 650.0000 mg | ORAL_TABLET | Freq: Four times a day (QID) | ORAL | Status: DC | PRN

## 2020-11-02 MED ORDER — FENTANYL BOLUS VIA INFUSION
100.0000 ug | INTRAVENOUS | Status: DC | PRN
  Filled 2020-11-02: qty 200

## 2020-11-02 MED ORDER — FENTANYL BOLUS VIA INFUSION
100.0000 ug | INTRAVENOUS | Status: DC | PRN
Start: 2020-11-02 — End: 2020-11-02

## 2020-11-02 MED ORDER — CLONAZEPAM 0.5 MG PO TABS: 0.5000 mg | ORAL_TABLET | Freq: Three times a day (TID) | ORAL | Status: DC

## 2020-11-02 MED ORDER — PROSOURCE TF PO LIQD: 90.0000 mL | Freq: Three times a day (TID) | ORAL | Status: DC

## 2020-11-02 MED ORDER — GLYCOPYRROLATE 1 MG PO TABS
1.0000 mg | ORAL_TABLET | ORAL | Status: DC | PRN
  Filled 2020-11-02: qty 1

## 2020-11-02 MED ORDER — CLONAZEPAM 0.5 MG PO TABS
0.5000 mg | ORAL_TABLET | Freq: Three times a day (TID) | ORAL | Status: DC
  Administered 2020-11-02: 0.5 mg
  Filled 2020-11-02: qty 1

## 2020-11-03 LAB — CULTURE, RESPIRATORY W GRAM STAIN

## 2020-11-03 LAB — GLUCOSE, CAPILLARY
Glucose-Capillary: 121 mg/dL — ABNORMAL HIGH (ref 70–99)
Glucose-Capillary: 106 mg/dL — ABNORMAL HIGH (ref 70–99)

## 2020-11-06 ENCOUNTER — Other Ambulatory Visit (HOSPITAL_COMMUNITY): Payer: Self-pay | Admitting: Cardiology

## 2020-11-07 NOTE — Progress Notes (Signed)
Initial Nutrition Assessment  DOCUMENTATION CODES:   Morbid obesity  INTERVENTION:   Tube feeding via OG tube: Vital 1.5 at 40 ml/h (960 ml per day) Prosource TF 90 ml TID  Provides 1680 kcal, 130 gm protein, 729 ml free water daily   NUTRITION DIAGNOSIS:   Increased nutrient needs related to catabolic illness as evidenced by estimated needs.  GOAL:   Patient will meet greater than or equal to 90% of their needs  MONITOR:   TF tolerance, I & O's  REASON FOR ASSESSMENT:   Consult, Ventilator Enteral/tube feeding initiation and management  ASSESSMENT:   Pt with PMH of CHF, COPD on 3 L O2, OSA, CKD stage III, Afib, morbid obesity, quit smoking 14 mo ago, DM, and noncompliance admitted for AKI and decompensated CHF who failed IV lasix at home.   Palliative care consulted, pt not a long term candidate for HD per Renal.   11/23 admitted 11/24 pt noted having seizures, intubated, CVVHD initiated  Patient is currently intubated on ventilator support MV: 7 L/min Temp (24hrs), Avg:97.6 F (36.4 C), Min:96.6 F (35.9 C), Max:98.1 F (36.7 C)  Medications reviewed and include: SSI Precedex Fentanyl  Milrinone Levophed @ 9 mcg  Labs reviewed:  CBG's: [phone] -- transitioned off insulin drip    UOP: 325 ml  UF: 5142 ml  14 F OG tube: tip mid to distal stomach  Weight hx reviewed, EDW unclear  Current TF: Vital High Protein @ 40 ml/hr 45 ml ProSource TF BID Provides: 1040 kcal and 106 grams  Diet Order:   Diet Order            Diet NPO time specified  Diet effective now                 EDUCATION NEEDS:   No education needs have been identified at this time  Skin:  Skin Assessment: Reviewed RN Assessment  Last BM:     Height:   Ht Readings from Last 1 Encounters:  10/08/2020 5\' 2"  (1.575 m)    Weight:   Wt Readings from Last 1 Encounters:  November 11, 2020 117.3 kg    Ideal Body Weight:  50 kg  BMI:  Body mass index is 47.3 kg/m.  Estimated  Nutritional Needs:   Kcal:  1700  Protein:  >125 grams  Fluid:  >1.5 L/day  Lockie Pares., RD, LDN, CNSC See AMiON for contact information

## 2020-11-07 NOTE — Progress Notes (Addendum)
Advanced Heart Failure Rounding Note   Subjective:    Remains sedated on vent. On NE 9 and milrinone 0.125. We gave her high-dose lasix yesterday and metolazone with minimal urine output. Now back on CVVHD pulling -50 to -100.   Still with occasional myoclonic jerking. EEG negative for seizures but shows diffuse encephalopathy.   CVP 18  Co-ox 86%   Objective:   Weight Range:  Vital Signs:   Temp:  [96.8 F (36 C)-98.1 F (36.7 C)] 97.9 F (36.6 C) (11/26 0800) Pulse Rate:  [76-95] 95 (11/26 0800) Resp:  [15-29] 18 (11/26 0800) BP: (78-141)/(41-107) 124/68 (11/26 0800) SpO2:  [88 %-98 %] 93 % (11/26 0800) FiO2 (%):  [80 %] 80 % (11/26 0400) Weight:  [117.3 kg] 117.3 kg (11/26 0444) Last BM Date:  (PTA)  Weight change: Filed Weights   10/31/20 0333 11/01/20 0446 11-12-2020 0444  Weight: 115.9 kg 117 kg 117.3 kg    Intake/Output:   Intake/Output Summary (Last 24 hours) at 11/12/20 0830 Last data filed at 11/12/2020 0800 Gross per 24 hour  Intake 3284.29 ml  Output 5537 ml  Net -2252.71 ml     Physical Exam: General:  Obese woman intubated/sedated HEENT: normal Neck: supple. RIJ trialysis LIJ TLC  Cor: PMI nondisplaced. Regular rate & rhythm. No rubs, gallops or murmurs. Lungs: coarse Abdomen: markedly obese + distended. No hepatosplenomegaly. No bruits or masses. Good bowel sounds. Extremities: no cyanosis, clubbing, rash, 2-3+ edema Neuro: Intubated/sedated. Occasional diffuse myoclonic jerking  Telemetry: NSR 90s Personally reviewed   Labs: Basic Metabolic Panel: Recent Labs  Lab 10/27/2020 2328 10/31/20 0741 10/31/20 1734 10/31/20 1734 11/01/20 0051 11/01/20 0202 11/01/20 0425 11/01/20 1110 11/01/20 1617 Nov 12, 2020 0442  NA 137   < > 139  --   --  139 139  --  136 136  K 4.2   < > 4.1  --   --  3.8 3.8  --  4.6 4.0  CL 98  --  96*  --   --   --  100  --  99 98  CO2 23  --  28  --   --   --  27  --  26 26  GLUCOSE 168*  --  144*  --   --    --  128*  --  110* 123*  BUN 55*  --  51*  --   --   --  31*  --  21 14  CREATININE 4.81*  --  4.68*  --   --   --  2.89*  --  2.04* 1.78*  CALCIUM 7.8*  --  7.8*   < >  --   --  7.7*  --  7.8* 8.0*  MG  --   --   --   --  2.3  --  2.2  --   --  2.3  PHOS  --   --  6.5*  --   --   --  3.9 3.3 3.4 2.8   < > = values in this interval not displayed.    Liver Function Tests: Recent Labs  Lab 10/15/2020 2146 10/31/20 1734 11/01/20 0425 11/01/20 1617 November 12, 2020 0442  AST 18  --   --   --   --   ALT 8  --   --   --   --   ALKPHOS 72  --   --   --   --   BILITOT 1.4*  --   --   --   --  PROT 7.2  --   --   --   --   ALBUMIN 3.9 3.7 3.6 3.2* 3.1*   No results for input(s): LIPASE, AMYLASE in the last 168 hours. Recent Labs  Lab 10/31/20 1956 11/01/20 1200  AMMONIA 21 23    CBC: Recent Labs  Lab 10/27/2020 1611 10/31/20 0741 10/31/20 1600 10/31/20 1956 11/01/20 0202 11/01/20 0425 2020-11-07 0442  WBC 7.1  --   --  10.2  --  15.2* 13.7*  HGB 11.9*   < > 12.2 12.1 13.9 12.1 11.0*  HCT 41.1   < > 36.0 41.7 41.0 42.1 37.7  MCV 93.4  --   --  92.1  --  92.5 94.0  PLT 209  --   --  239  --  247 192   < > = values in this interval not displayed.    Cardiac Enzymes: No results for input(s): CKTOTAL, CKMB, CKMBINDEX, TROPONINI in the last 168 hours.  BNP: BNP (last 3 results) Recent Labs    04/16/20 2301 05/01/20 1300 10/22/2020 1628  BNP 1,641.1* 2,032.8* 468.3*    ProBNP (last 3 results) Recent Labs    12/14/19 1400  PROBNP 905.0*      Other results:  Imaging: DG Abd 1 View  Result Date: 10/31/2020 CLINICAL DATA:  63 year old female status post intubation and feeding tube placement. EXAM: PORTABLE CHEST 1 VIEW COMPARISON:  Chest radiograph dated 10/31/2020. FINDINGS: Suboptimally visualized tip of the endotracheal tube likely at the level of the carina or tilting into the right mainstem bronchus. Recommend retraction by approximately 4-5 cm for optimal positioning.  Right IJ dialysis catheter and left IJ central venous line in similar position. Interval placement of a feeding tube with tip likely in the mid to distal stomach. Near complete opacification of the left hemithorax, progressed since the prior radiograph. Diffuse vascular and interstitial prominence of the right lung. There is no pneumothorax. The cardiac borders are silhouetted. Mildly dilated air-filled loops of small bowel noted in the upper abdomen. IMPRESSION: 1. Suboptimally visualized tip of the endotracheal tube likely at the level of the carina or tilting into the right mainstem bronchus. Recommend retraction by approximately 4-5 cm for optimal positioning. 2. Feeding tube with tip in the mid to distal stomach. These results were called by telephone at the time of interpretation on 10/31/2020 at 9:22 pm to the charge nurse, Annice Pih, who verbally acknowledged these results. Electronically Signed   By: Anner Crete M.D.   On: 10/31/2020 21:31   DG Abd 1 View  Result Date: 10/31/2020 CLINICAL DATA:  NG tube placement EXAM: ABDOMEN - 1 VIEW COMPARISON:  07/04/2010, 10/31/2020 FINDINGS: Partially visualized central venous catheter tip over the right atrium. Dense consolidation left lung base with air bronchograms. Esophageal tube tip in the left upper abdomen. IMPRESSION: Esophageal tube tip overlies the left upper quadrant/gastric body region. Dense consolidation in the left lung base. Electronically Signed   By: Donavan Foil M.D.   On: 10/31/2020 21:05   CT HEAD WO CONTRAST  Result Date: 10/31/2020 CLINICAL DATA:  Seizure. EXAM: CT HEAD WITHOUT CONTRAST TECHNIQUE: Contiguous axial images were obtained from the base of the skull through the vertex without intravenous contrast. COMPARISON:  MRI head, dated September 27, 2013 and CT head dated June 17, 2013. FINDINGS: Brain: There is mild cerebral atrophy with widening of the extra-axial spaces and ventricular dilatation. There are areas of  decreased attenuation within the white matter tracts of the supratentorial brain, consistent  with microvascular disease changes. Small chronic right basal ganglia lacunar infarcts are seen. Vascular: No hyperdense vessel or unexpected calcification. Skull: Normal. Negative for fracture or focal lesion. Sinuses/Orbits: Postoperative changes are seen involving the medial wall of the left maxillary sinus. Mild to moderate severity sphenoid sinus, left maxillary sinus and left ethmoid sinus mucosal thickening is also seen. Other: There is mild left posterior parietal scalp soft tissue swelling. IMPRESSION: 1. Mild left posterior parietal scalp soft tissue swelling. 2. No acute intracranial abnormality. 3. Chronic right basal ganglia lacunar infarcts. 4. Mild to moderate severity pansinus disease. Electronically Signed   By: Virgina Norfolk M.D.   On: 10/31/2020 15:21   DG CHEST PORT 1 VIEW  Result Date: 11/01/2020 CLINICAL DATA:  Read just endotracheal tube EXAM: PORTABLE CHEST 1 VIEW COMPARISON:  10/31/2020 FINDINGS: Endotracheal tube tip now measuring about 1.7 cm above the carina. Bilateral central venous catheters and enteric tube are unchanged in position. Generator pack projected over the left axilla. Complete opacification of the left hemithorax may indicate effusion, consolidation, and/or atelectasis. Increasing airspace infiltration in the right lung since prior study. No pneumothorax. IMPRESSION: 1. Endotracheal tube tip now measuring 1.7 cm above the carina. 2. Complete opacification of the left hemithorax with increasing airspace disease in the right lung. Electronically Signed   By: Lucienne Capers M.D.   On: 11/01/2020 01:29   DG CHEST PORT 1 VIEW  Result Date: 10/31/2020 CLINICAL DATA:  64 year old female status post intubation and feeding tube placement. EXAM: PORTABLE CHEST 1 VIEW COMPARISON:  Chest radiograph dated 10/31/2020. FINDINGS: Suboptimally visualized tip of the endotracheal tube  likely at the level of the carina or tilting into the right mainstem bronchus. Recommend retraction by approximately 4-5 cm for optimal positioning. Right IJ dialysis catheter and left IJ central venous line in similar position. Interval placement of a feeding tube with tip likely in the mid to distal stomach. Near complete opacification of the left hemithorax, progressed since the prior radiograph. Diffuse vascular and interstitial prominence of the right lung. There is no pneumothorax. The cardiac borders are silhouetted. Mildly dilated air-filled loops of small bowel noted in the upper abdomen. IMPRESSION: 1. Suboptimally visualized tip of the endotracheal tube likely at the level of the carina or tilting into the right mainstem bronchus. Recommend retraction by approximately 4-5 cm for optimal positioning. 2. Feeding tube with tip in the mid to distal stomach. These results were called by telephone at the time of interpretation on 10/31/2020 at 9:22 pm to the charge nurse, Annice Pih, who verbally acknowledged these results. Electronically Signed   By: Anner Crete M.D.   On: 10/31/2020 21:31   DG CHEST PORT 1 VIEW  Result Date: 10/31/2020 CLINICAL DATA:  64 year old female intubated. EXAM: PORTABLE CHEST 1 VIEW COMPARISON:  0442 hours today. FINDINGS: Portable AP supine view at 1430 hours. Endotracheal tube tip at the level the clavicles. Left IJ approach central line and right IJ approach dual lumen catheter have been placed. Patient is rotated to the left, catheters appear to terminates at the lower SVC level and/or right atrium. No pneumothorax identified. Chronic cardiomegaly with continued dense opacification of the left lung. Left pleural effusion is possible but not certain. Stable right lung. IMPRESSION: 1. Endotracheal tube tip in good position at the level of the clavicles. 2. Left IJ central line and right IJ approach dual lumen catheter have been placed with no adverse features. 3.  Cardiomegaly with continued dense opacification of the left mid and  lower lung. Electronically Signed   By: Genevie Ann M.D.   On: 10/31/2020 15:25   EEG adult  Result Date: 11/01/2020 Lora Havens, MD     11/01/2020  8:57 AM Patient Name: Destry Bezdek MRN: [id number] Epilepsy Attending: Lora Havens Referring Physician/Provider: Dr Eulogio Bear Date: 10/31/2020 Duration: 25.37 mins Patient history: 64 year old female with seizure-like episodes.  EEG to evaluate for seizures. Level of alertness:  Comatose AEDs during EEG study: Ativan Technical aspects: This EEG study was done with scalp electrodes positioned according to the 10-20 International system of electrode placement. Electrical activity was acquired at a sampling rate of 500Hz  and reviewed with a high frequency filter of 70Hz  and a low frequency filter of 1Hz . EEG data were recorded continuously and digitally stored. Description: No clear posterior dominant rhythm was seen.  EEG showed generalized polymorphic predominantly 5 to 7 Hz theta slowing as well as intermittent generalized 2 to 3 Hz delta slowing.  Hyperventilation and photic stimulation were not performed.   ABNORMALITY -Continuous slow, generalized IMPRESSION: This study is suggestive of moderate to severe diffuse encephalopathy, nonspecific etiology.  No seizures or epileptiform discharges were seen throughout the recording. Lora Havens   ECHOCARDIOGRAM COMPLETE  Result Date: 10/31/2020    ECHOCARDIOGRAM REPORT   Patient Name:   ALEXARAE OLIVA [place] Date of Exam: 10/31/2020 Medical Rec #:  [id number]              Height:       62.0 in Accession #:    [id number]             Weight:       255.5 lb Date of Birth:  [birth year]              BSA:          2.121 m Patient Age:    89 years               BP:           104/58 mmHg Patient Gender: F                      HR:           92 bpm. Exam Location:  Inpatient Procedure: 2D Echo Indications:    CHF 428.31  History:         Patient has prior history of Echocardiogram examinations, most                 recent 01/04/2020. CHF, CAD, COPD; Risk Factors:Hypertension,                 Dyslipidemia, Diabetes and Former Smoker. CVA. pulmonary                 hypertension.  Sonographer:    Jannett Celestine RDCS (AE) Referring Phys: [id number] Lequita Halt  Sonographer Comments: Technically difficult study due to poor echo windows, suboptimal apical window, suboptimal parasternal window, patient is morbidly obese and echo performed with patient supine and on artificial respirator. Image acquisition challenging due to patient body habitus. restricted mobility IMPRESSIONS  1. Left ventricular ejection fraction, by estimation, is 40 to 45%. The left ventricle has mildly decreased function. The left ventricle demonstrates global hypokinesis. The left ventricular internal cavity size was not well visualized. There is not well visualized left ventricular hypertrophy. Left ventricular diastolic function could not be evaluated.  2. Right ventricular systolic function is moderately reduced. The  right ventricular size is moderately enlarged.  3. A small pericardial effusion is present.  4. The mitral valve is abnormal. not well visualized mitral valve regurgitation.  5. Tricuspid valve regurgitation not well visualized.  6. The aortic valve was not well visualized. Aortic valve regurgitation not well viisualized.  7. Pulmonic valve regurgitation not assessed. Conclusion(s)/Recommendation(s): Almost no parasternal or apical windows, very limited views. RV and LV function appear mild-moderately reduced. There is the appearance of small pericardial effusion but unable to fully assess extent or presence of tamponade givel limited views. FINDINGS  Left Ventricle: Left ventricular ejection fraction, by estimation, is 40 to 45%. The left ventricle has mildly decreased function. The left ventricle demonstrates global hypokinesis. The left ventricular internal cavity  size was not well visualized. There is not well visualized left ventricular hypertrophy. Left ventricular diastolic function could not be evaluated. Right Ventricle: The right ventricular size is moderately enlarged. Right vetricular wall thickness was not well visualized. Right ventricular systolic function is moderately reduced. Left Atrium: Left atrial size was not well visualized. Right Atrium: Right atrial size was not well visualized. Pericardium: A small pericardial effusion is present. Mitral Valve: The mitral valve is abnormal. There is moderate calcification of the mitral valve leaflet(s). Not well visualized mitral valve regurgitation. Tricuspid Valve: The tricuspid valve is not well visualized. Tricuspid valve regurgitation not well visualized. Aortic Valve: The aortic valve was not well visualized. Aortic valve regurgitation not well viisualized. Pulmonic Valve: The pulmonic valve was not well visualized. Pulmonic valve regurgitation not assessed. Aorta: The aortic root was not well visualized, the ascending aorta was not well visualized and the aortic arch was not well visualized. Venous: IVC assessment for right atrial pressure unable to be performed due to mechanical ventilation. IAS/Shunts: The interatrial septum was not well visualized. Buford Dresser MD Electronically signed by Buford Dresser MD Signature Date/Time: 10/31/2020/7:51:27 PM    Final      Medications:     Scheduled Medications: . chlorhexidine gluconate (MEDLINE KIT)  15 mL Mouth Rinse BID  . Chlorhexidine Gluconate Cloth  6 each Topical Daily  . feeding supplement (PROSource TF)  45 mL Per Tube BID  . feeding supplement (VITAL HIGH PROTEIN)  1,000 mL Per Tube Q24H  . insulin aspart  0-9 Units Subcutaneous Q4H  . mouth rinse  15 mL Mouth Rinse 10 times per day  . midazolam  2 mg Intravenous Once  . pantoprazole sodium  40 mg Per Tube Daily  . rosuvastatin  10 mg Per Tube QHS  . sodium chloride flush  3  mL Intravenous Q12H    Infusions: .  prismasol BGK 4/2.5 400 mL/hr at 2020-12-02 0442  .  prismasol BGK 4/2.5 300 mL/hr at 11/01/20 2326  . sodium chloride    . cefTRIAXone (ROCEPHIN)  IV    . dexmedetomidine (PRECEDEX) IV infusion 1 mcg/kg/hr (2020/12/02 0800)  . fentaNYL infusion INTRAVENOUS 200 mcg/hr (December 02, 2020 0800)  . heparin 700 Units/hr (02-Dec-2020 0800)  . milrinone 0.125 mcg/kg/min (12/02/2020 0800)  . norepinephrine (LEVOPHED) Adult infusion 9 mcg/min (12/02/2020 0800)  . prismasol BGK 2/2.5 dialysis solution 2,000 mL/hr at December 02, 2020 0754    PRN Medications: sodium chloride, acetaminophen, albuterol, fentaNYL, heparin, LORazepam, ondansetron (ZOFRAN) IV, sodium chloride flush   Assessment/Plan:   1.Acute onChronic diastolic/end-stage RV failure due to NICM - Echo 9/20 EF 25-30% (new). RV severely HK  - Cath 8/20 with minimal CAD - Echo 1/21 EF 45-50% with mod RV dysfunction. Grade 2 DD. MV is  thickened with mild MR/MS - RHC 7/21 with mixed moderate pulmonary HTN - Echo 10/11/20 EF 55-60% RV severely HK.  - admitted for a/c CHF w/ marked fluid overloaded in the setting of end-stage RV failure and worsening CKD. Failed escalation of home IV diuretics. SCr 4.8 on admit (baseline 1.9-2.0).  - started on CVVHD on 10/31/20.  - on milrinone 0.125 and NE 9  Co-ox 86% - Failed high-dose diuretic challenge yesterday. Now back on CVVHD - Not long-term HD candidate given size and severe RV failure.  - Family requesting family meeting today to discuss [place].  - Will consult Palliative Care  2. AKI on CKD - baseline SCr ~1.9-20 - SCr up to 4.8 on admit, BUN 55 and oliguric. K 4.0 - likely cardiorenal. However given normal co-ox with persistent renal failure suspect component of ATN - started on CVVHD on 10/31/20.  - Failed high-dose diuretic challenge yesterday. Now back on CVVHD - Not long-term HD candidate given size and severe RV failure.  - Family meeting today to discuss [place] -  appreciate Renal input  3. Acute on chronic hypoxic respiratory failure - Follows with Dr. Valeta Harms in Pulmonary. Has COPD and OSA/OHS - intubated 10/31/20 - CCM following. D/w Dr. Lynetta Mare today - not ready for extubation yet  4. PAH, mild to moderate - likely who GROUP II & III - RHC 7/21 with mixed moderate pulmonary HTN - not candidate for selective pulmonary vasodilators  5. DM2 - recent hgb A1c 6.1  - management per primary team   6. Paroxysmal Atrial Fibrillation  - in NSR currently  - On heparin  10. UTI + UA. Check Urine cultures and blood cultures  - abx per IM  - check CBC   11. Confusion/ AMS - d/w IM. Suspect metabolic encephalopathy from polypharmacy in setting of AKI. They are adjusting meds.  - check ABG - treat UTI w/ abx. Check blood cultures    - ammonia 23  CRITICAL CARE Performed by: Glori Bickers  Total critical care time: 40 minutes  Critical care time was exclusive of separately billable procedures and treating other patients.  Critical care was necessary to treat or prevent imminent or life-threatening deterioration.  Critical care was time spent personally by me (independent of midlevel providers or residents) on the following activities: development of treatment plan with patient and/or surrogate as well as nursing, discussions with consultants, evaluation of patient's response to treatment, examination of patient, obtaining history from patient or surrogate, ordering and performing treatments and interventions, ordering and review of laboratory studies, ordering and review of radiographic studies, pulse oximetry and re-evaluation of patient's condition.     Length of Stay: 3   Glori Bickers MD 11/12/20, 8:30 AM  Advanced Heart Failure Team Pager [phone] (M-F; [place])  Please contact [facility] for night-coverage after hours (4p -7a ) and weekends on [email or web address]

## 2020-11-07 NOTE — Progress Notes (Signed)
[facility] Progress Note  Name: [name] MRN: [id number] DOB: [birth year]  Chief Complaint:  Directed to come to hospital due to renal failure and fluid overload  Subjective:  Seen and examined on CRRT; procedure supervised. Tolerating CV with aggressive UF total net negative 2.2L. Minimal UOP despite diuretics but reportedly rate of UOP increasing this morning. E coli resulted on UCx. She is sedated. Norepi ~7-9.  Review of systems:  Unable to obtain secondary to mechanical ventilation    Intake/Output Summary (Last 24 hours) at November 24, 2020 0924 Last data filed at 11-24-20 0900 Gross per 24 hour  Intake 3274.11 ml  Output 5551 ml  Net -2276.89 ml    Vitals:  Vitals:   2020-11-24 0645 24-Nov-2020 0700 2020/11/24 0800 2020/11/24 0900  BP: 118/62 105/60 124/68 106/62  Pulse: 92 89 95 94  Resp: _0 Temp: 97.7 F (36.5 C) 97.9 F (36.6 C) 97.9 F (36.6 C) (!) 97.3 F (36.3 C)  TempSrc:   Esophageal   SpO2: 93% 92% 93% (!) 89%  Weight:      Height:         Physical Exam:  GEN: sedated, intubated, in bed ENT: no nasal discharge, mmm, ET tube in place EYES: no scleral icterus, does not open eyes to comand CV: normal rate, no audible murmurs PULM: bilateral chest rise, coarse bilateral breath sounds, ventilated ABD: NABS, non-distended SKIN: no rashes or jaundice EXT: 2+ pitting edema in BLE, warm at bilateral shisn Access: RIJ nontunneled HD cath  Scheduled Meds: . chlorhexidine gluconate (MEDLINE KIT)  15 mL Mouth Rinse BID  . Chlorhexidine Gluconate Cloth  6 each Topical Daily  . feeding supplement (PROSource TF)  45 mL Per Tube BID  . feeding supplement (VITAL HIGH PROTEIN)  1,000 mL Per Tube Q24H  . insulin aspart  0-9 Units Subcutaneous Q4H  . mouth rinse  15 mL Mouth Rinse 10 times per day  . midazolam  2 mg Intravenous Once  . pantoprazole sodium  40 mg Per Tube Daily  . rosuvastatin  10 mg Per Tube QHS  . sodium chloride  flush  3 mL Intravenous Q12H   Continuous Infusions: .  prismasol BGK 4/2.5 400 mL/hr at 24-Nov-2020 0442  .  prismasol BGK 4/2.5 300 mL/hr at 11/01/20 2326  . sodium chloride    . cefTRIAXone (ROCEPHIN)  IV    . dexmedetomidine (PRECEDEX) IV infusion 0.5 mcg/kg/hr (11/24/20 0900)  . fentaNYL infusion INTRAVENOUS 100 mcg/hr (11-24-20 0900)  . heparin 700 Units/hr (November 24, 2020 0900)  . milrinone 0.125 mcg/kg/min (11/24/20 0900)  . norepinephrine (LEVOPHED) Adult infusion 9 mcg/min (2020/11/24 0900)  . prismasol BGK 2/2.5 dialysis solution 2,000 mL/hr at 2020-11-24 0754   PRN Meds:.sodium chloride, acetaminophen, albuterol, fentaNYL, heparin, LORazepam, ondansetron (ZOFRAN) IV, sodium chloride flush   Labs:  BMP Latest Ref Rng & Units 11-24-20 11/01/2020 11/01/2020  Glucose 70 - 99 mg/dL 123(H) 110(H) 128(H)  BUN 8 - 23 mg/dL 14 21 31(H)  Creatinine 0.44 - 1.00 mg/dL 1.78(H) 2.04(H) 2.89(H)  BUN/Creat Ratio 12 - 28 - - -  Sodium 135 - 145 mmol/L 136 136 139  Potassium 3.5 - 5.1 mmol/L 4.0 4.6 3.8  Chloride 98 - 111 mmol/L 98 99 100  CO2 22 - 32 mmol/L _1 Calcium 8.9 - 10.3 mg/dL 8.0(L) 7.8(L) 7.7(L)     Assessment/Plan:   # Acute kidney injury on CKD IIIb/IV - Secondary to cardiorenal syndrome.  CRRT started on 11/24  to optimize volume and for clearance.   - Goal net negative 2-3L daily, can adjust UF as needed - UOP minimal but will CTM - GOC convo today - She is a poor long-term candidate for dialysis with her functional status, cardiac history, and history of noncompliance; prior to initiating she stated that she would not want long term dialysis but does want to treatment of her acute renal failure in lieu of palliative measures.  Her sister is her closest relative and appears to have the patient's best interests at heart and respects the patient's wish of no long-term dialysis  - Unable to determine whether she would be dependent on dialysis at this time - palliative  consulted  # Altered mental status - Secondary to post ictal state as well as multiple medications including gabapentin which have been discontinued.  Also in the setting of renal failure - Now intubated requiring sedation  # Acute on chronic combined systolic and diastolic CHF w/ Shock - Optimize volume with CRRT - on milrinone per cardiology/CHF - Continue norepi as needed  # Chronic hypoxic respiratory failure  - mechanical ventilation per pulm and optimize volume status as able with RRT  # Seizure - neurology has been consulted  # E coli UTI: ucx w/ pansensitive E coli. On ceftriaxone   J , MD 11/01/2020 9:24 AM  

## 2020-11-07 NOTE — Procedures (Signed)
Patient Name: Loralie Malta  MRN: [id number]  Epilepsy Attending: Lora Havens  Referring Physician/Provider: Dr Zeb Comfort Duration: 11/01/2020 1349 to 11-23-20 0918  Patient history: 64 year old female with seizure-like episodes.  EEG to evaluate for seizures.  Level of alertness:  Comatose  AEDs during EEG study: None  Technical aspects: This EEG study was done with scalp electrodes positioned according to the 10-20 International system of electrode placement. Electrical activity was acquired at a sampling rate of 500Hz  and reviewed with a high frequency filter of 70Hz  and a low frequency filter of 1Hz . EEG data were recorded continuously and digitally stored.   Description: No clear posterior dominant rhythm was seen.  EEG showed generalized polymorphic predominantly 5 to 7 Hz theta slowing as well as intermittent generalized 2 to 3 Hz delta slowing.  Hyperventilation and photic stimulation were not performed.     Multiple episodes of jerking were noted during which patient was noted to have sudden asymmetric abduction and elevation of bilateral upper and lower extremity.  At times during these jerks, concomitant EEG showed generalized polyspikes.  ABNORMALITY - Myoclonic seizure, generalized - Continuous slow, generalized  IMPRESSION: This study showed multiple myoclonic seizures, generalized onset as well as severe diffuse encephalopathy, nonspecific etiology.  Evalise Abruzzese Barbra Sarks

## 2020-11-07 NOTE — Consult Note (Signed)
Consultation Note Date: 2020-11-16   Patient Name: [name]  DOB: 1956/04/27  MRN: [id number]  Age / Sex: 64 y.o., female  PCP: Nche, Charlene Brooke, NP Referring Physician: Candee Furbish, MD  Reason for Consultation: Establishing goals of care and Psychosocial/spiritual support  HPI/Patient Profile: 64 y.o. female  admitted on 10/28/2020 with past medical history significant of chronic combined systolic and diastolic CHF, chronic hypoxic respite failure on 3 L continuously, CKD stage III, HTN, paroxysmal A. fib, presented with worsening of kidney function.  On 11/7, patient was started on IV Lasix for decompensated CHF.  Patient reported she has been responded well on the low day before yesterday her urine output has significantly decreased, that she urinated only 2 times the whole day the day before yesterday, and the urine color appeared to be very dark.  Yesterday the blood work showed her kidney function elevated with creatinine 3.8 compared to baseline 1.9-2.  IV Lasix was discontinued yesterday.    ED Course: Creatinine 4.8, potassium 4.3, bicarb 27.  Chest x-ray showed cardiomegaly and bilateral pulmonary congestion.  Remains sedated on vent. On NE 9 and milrinone 0.125. We gave her high-dose lasix yesterday and metolazone with minimal urine output. Now back on CVVHD pulling -50 to -100.   Still with occasional myoclonic jerking. EEG negative for seizures but shows diffuse encephalopathy.   Per family at baseline patient has had continued physical and functional decline over the past year.  They speak to her quality of life at baseline not what she hoped for.  Family face treatment option decisions, advanced directive decisions and anticipatory care needs.   Clinical Assessment and Goals of Care:  This nurse practitioner reviewed medical records, received report from team and then  met at the patient's bedside along with her sister and niece to discuss current medical situation; diagnosis, prognosis, treatment options and patient goals of care..   Concept of Palliative Care was introduced as specialized medical care for people and their families living with serious illness.  If focuses on providing relief from the symptoms and stress of a serious illness.  The goal is to improve quality of life for both the patient and the family.  Values and goals of care important to patient and family were attempted to be elicited.  Created space and opportunity for patient  and family to explore thoughts and feelings regarding current medical situation.  Family verbalized an understanding of the seriousness of the current medical situation and the limited prognosis.  They have had detailed discussions with the attending team.   Decision has been made to shift to a comfort path, allowing for a natural death, liberating from ventilator.   The difference between a aggressive medical intervention path  and a palliative comfort care path for this patient at this time was had.       Education regarding Natural trajectory and expectations at EOL  Questions and concerns addressed.  Patient  encouraged to call with questions or concerns.     PMT  will continue to support holistically.   NEXT OF KIN    SUMMARY OF RECOMMENDATIONS    Code Status/Advance Care Planning:  DNR- documented today   Palliative Prophylaxis:   Frequent Pain Assessment and Oral Care   End-of-life order set per attending  Additional Recommendations (Limitations, Scope, Preferences):  Full Comfort Care  Psycho-social/Spiritual:   Desire for further Chaplaincy support:no- strong communicty church support  Additional Recommendations: Grief/Bereavement Support  Prognosis:   Hours - Days  Discharge Planning: Anticipated Hospital Death      Primary Diagnoses: Present on Admission: . Acute on  chronic combined systolic and diastolic CHF (congestive heart failure) ([place]) . COPD (chronic obstructive pulmonary disease) ([place]) . Morbid obesity due to excess calories ([place]) . OSA (obstructive sleep apnea) . Paroxysmal atrial fibrillation ([facility]) . Pulmonary hypertension, unspecified ([place]) . Type 2 diabetes mellitus with diabetic polyneuropathy ([place])   I have reviewed the medical record, interviewed the patient and family, and examined the patient. The following aspects are pertinent.  Past Medical History:  Diagnosis Date  . Anxiety    doesn't take any meds for this  . Arteriosclerosis of coronary artery 08/31/2011   Myoview 9/12: EF 44, no ischemia // Myoview 8/18: EF 57, low risk, possible small area of inferior ischemia // Minor nonobstructive CAD by cardiac catheterization - [facility] 8/18: Proximal LAD 35, OM2 25  . Arthritis    knuckles  . Asthma   . Carotid artery disease ([place])    Carotid US 7/14: Bilateral ICA 40-59 // Carotid US ([facility]) 2/17: bilat plaque without sig stenosis  . Chronic diastolic CHF (congestive heart failure) ([place]) 07/26/2017   Echo 7/18:Severe conc LVH, EF 60-65, no RWMA, Gr 2 DD, MAC, mild MR // right and left heart cath 8/18: LVEDP 21  . Claustrophobia 07/26/2012  . COPD (chronic obstructive pulmonary disease) ([place])    "chronic bronchitis - about every winter"  . CSF leak    dizziness/headache - assoc symptoms // 2/2 encephalocele s/p skull base reconstruction  . Diverticulosis   . Dyspnea   . History of CVA (cerebrovascular accident) 03/2004   denies residual 07/26/2012  . Hx of gout   . Hyperlipidemia    takes Pravastatin daily  . Hypertensive heart disease with chronic diastolic congestive heart failure ([place]) 07/26/2017  . Internal hemorrhoids   . Kidney stones   . LVH (left ventricular hypertrophy)    Echo 8/18: Severe conc LVH, EF 60-65, no RWMA, Gr 2 DD, MAC, mild MR  . OSA (obstructive sleep apnea) 07/26/2012   prior CPAP - stopped due to being  primary caregiver for dying parent (stopped in 2016)  . PSVT (paroxysmal supraventricular tachycardia) ([place])   . Pulmonary hypertension, unspecified ([place]) 08/14/2017   cath 07/2017 >>PASP 42m Hg, PA mean 39 mm Hg  . Type II diabetes mellitus ([place]    Social History   Socioeconomic History  . Marital status: Single    Spouse name: Not on file  . Number of children: 0  . Years of education: Not on file  . Highest education level: Not on file  Occupational History  . Occupation: disabled  Tobacco Use  . Smoking status: Former Smoker    Packs/day: 0.50    Years: 35.00    Pack years: 17.50    Types: Cigarettes    Quit date: 08/16/2019    Years since quitting: 1.2  . Smokeless tobacco: Never Used  Vaping Use  . Vaping Use: Never used  Substance and Sexual Activity  .  Alcohol use: Yes    Alcohol/week: 1.0 standard drink    Types: 1 Glasses of wine per week    Comment: rarely  . Drug use: No  . Sexual activity: Not Currently    Birth control/protection: Surgical  Other Topics Concern  . Not on file  Social History Narrative   Currently unemployed.   No children, not married   Social Determinants of Radio broadcast assistant Strain:   . Difficulty of Paying Living Expenses: Not on file  Food Insecurity:   . Worried About Charity fundraiser in the Last Year: Not on file  . Ran Out of Food in the Last Year: Not on file  Transportation Needs:   . Lack of Transportation (Medical): Not on file  . Lack of Transportation (Non-Medical): Not on file  Physical Activity:   . Days of Exercise per Week: Not on file  . Minutes of Exercise per Session: Not on file  Stress:   . Feeling of Stress : Not on file  Social Connections:   . Frequency of Communication with Friends and Family: Not on file  . Frequency of Social Gatherings with Friends and Family: Not on file  . Attends Religious Services: Not on file  . Active Member of Clubs or Organizations: Not on file  . Attends English as a second language teacher Meetings: Not on file  . Marital Status: Not on file   Family History  Problem Relation Age of Onset  . Colon cancer Sister 54  . Breast cancer Mother 75  . Alzheimer's disease Mother   . Diabetes Sister 33  . Heart failure Sister   . Diabetes Sister 9  . Lung cancer Father   . Breast cancer Sister   . Cancer Sister        mouth  . Anesthesia problems Neg Hx   . Hypotension Neg Hx   . Malignant hyperthermia Neg Hx   . Pseudochol deficiency Neg Hx    Scheduled Meds: . chlorhexidine gluconate (MEDLINE KIT)  15 mL Mouth Rinse BID  . Chlorhexidine Gluconate Cloth  6 each Topical Daily  . clonazePAM  0.5 mg Per Tube TID  . feeding supplement (PROSource TF)  90 mL Per Tube TID  . insulin aspart  0-9 Units Subcutaneous Q4H  . mouth rinse  15 mL Mouth Rinse 10 times per day  . midazolam  2 mg Intravenous Once  . pantoprazole sodium  40 mg Per Tube Daily  . rosuvastatin  10 mg Per Tube QHS  . sodium chloride flush  3 mL Intravenous Q12H   Continuous Infusions: .  prismasol BGK 4/2.5 400 mL/hr at 18-Nov-2020 0442  .  prismasol BGK 4/2.5 300 mL/hr at 11/01/20 2326  . sodium chloride    . cefTRIAXone (ROCEPHIN)  IV Stopped (November 18, 2020 1022)  . dexmedetomidine (PRECEDEX) IV infusion 0.5 mcg/kg/hr (11-18-20 1100)  . feeding supplement (VITAL 1.5 CAL)    . fentaNYL infusion INTRAVENOUS 100 mcg/hr (November 18, 2020 1100)  . heparin 700 Units/hr (11/18/2020 1100)  . milrinone 0.125 mcg/kg/min (11-18-2020 1100)  . norepinephrine (LEVOPHED) Adult infusion 9 mcg/min (2020/11/18 1100)  . prismasol BGK 2/2.5 dialysis solution 2,000 mL/hr at 2020/11/18 1030   PRN Meds:.sodium chloride, acetaminophen, albuterol, fentaNYL, heparin, LORazepam, ondansetron (ZOFRAN) IV, sodium chloride flush Medications Prior to Admission:  Prior to Admission medications   Medication Sig Start Date End Date Taking? Authorizing Provider  acetaminophen (TYLENOL) 325 MG tablet Take 2 tablets (650 mg total) by mouth  every 6 (  six) hours as needed for mild pain. Patient taking differently: Take 650 mg by mouth every 6 (six) hours as needed for mild pain (or headaches).  06/30/18  Yes Luetta Nutting, DO  albuterol (VENTOLIN HFA) 108 (90 Base) MCG/ACT inhaler TAKE 2 PUFFS BY MOUTH EVERY 6 HOURS AS NEEDED FOR WHEEZE OR SHORTNESS OF BREATH Patient taking differently: Inhale 2 puffs into the lungs every 6 (six) hours as needed for wheezing or shortness of breath.  12/19/19  Yes Luetta Nutting, DO  allopurinol (ZYLOPRIM) 100 MG tablet Take 100 mg by mouth daily as needed (for gout flares).  06/24/18  Yes [provider]  apixaban (ELIQUIS) 5 MG TABS tablet Take 1 tablet (5 mg total) by mouth 2 (two) times daily. 06/20/20  Yes Evans Lance, MD  flecainide (TAMBOCOR) 50 MG tablet Take 1 tablet (50 mg total) by mouth 2 (two) times daily. 06/20/20  Yes Evans Lance, MD  FLUoxetine (PROZAC) 20 MG capsule Take 1 capsule (20 mg total) by mouth at bedtime. 04/13/20  Yes Nche, Charlene Brooke, NP  gabapentin (NEURONTIN) 100 MG capsule Take 200 mg by mouth at bedtime.  10/27/20  Yes [provider]  hydrALAZINE (APRESOLINE) 50 MG tablet TAKE 1 TABLET BY MOUTH EVERY 8 HOURS. Patient taking differently: Take 50 mg by mouth every 8 (eight) hours.  10/08/20  Yes Evans Lance, MD  metolazone (ZAROXOLYN) 2.5 MG tablet Take one tablet ONCE a week on Wednesday ONLY. Patient taking differently: Take 2.5 mg by mouth every Saturday.  08/08/20  Yes Evans Lance, MD  nitroGLYCERIN (NITROSTAT) 0.4 MG SL tablet Place 1 tablet (0.4 mg total) under the tongue every 5 (five) minutes as needed. Patient taking differently: Place 0.4 mg under the tongue every 5 (five) minutes as needed for chest pain.  07/27/17  Yes Weaver, Scott T, PA-C  omeprazole (PRILOSEC) 20 MG capsule Take 1 capsule (20 mg total) by mouth daily. 04/24/20  Yes Nche, Charlene Brooke, NP  OXYGEN Inhale 3-4 L/min into the lungs continuous.   Yes [provider]  potassium chloride (KLOR-CON) 10 MEQ tablet Take 2 tablets by mouth ONCE a week on Wednesday ONLY. Patient taking differently: Take 20 mEq by mouth every Saturday.  08/08/20  Yes Evans Lance, MD  PRESCRIPTION MEDICATION Apply 1 patch topically See admin instructions. Zio patch- As directed   Yes [provider]  rosuvastatin (CRESTOR) 40 MG tablet Take 1 tablet (40 mg total) by mouth at bedtime. 10/16/20  Yes Nche, Charlene Brooke, NP  torsemide (DEMADEX) 20 MG tablet Take 2 tablets (40 mg total) by mouth daily. Patient taking differently: Take 60 mg by mouth daily.  05/03/20  Yes Simmons, Brittainy M, PA-C  Verapamil HCl CR 300 MG CP24 TAKE 1 CAPSULE (300 MG TOTAL) BY MOUTH DAILY. PLEASE CALL TO SCHEDULE OVERDUE APPT WITH DR ROSS Patient taking differently: Take 300 mg by mouth daily.  04/04/20  Yes Nche, Charlene Brooke, NP  Blood Glucose Monitoring Suppl (ONETOUCH VERIO) w/Device KIT USE TO CHECK BLOOD GLUCOSE 4 TIMES A DAY . DIAGNOSIS: TYPE 2 DIABETES E11.59 11/09/19   Luetta Nutting, DO  gabapentin (NEURONTIN) 400 MG capsule Take 1 capsule (400 mg total) by mouth at bedtime. Patient not taking: Reported on 10/19/2020 04/24/20   Nche, Charlene Brooke, NP  glucose blood (ONETOUCH VERIO) test strip For E11.22.  Use to check glucose qid 11/09/19   Luetta Nutting, DO  isosorbide mononitrate (IMDUR) 60 MG 24 hr tablet TAKE  1 TABLET (60 MG TOTAL) BY MOUTH DAILY FOR 30 DAYS. ** DO NOT CRUSH ** Patient taking differently: Take 60 mg by mouth daily.  06/12/20   Nche, Charlene Brooke, NP   Allergies  Allergen Reactions  . Atorvastatin Nausea And Vomiting and Other (See Comments)    "legs cramped; moderate to almost severe"- also  . Ibuprofen Other (See Comments)    Per pt, MD doesn't want pt to take this   . Metformin And Related Nausea And Vomiting  . Ace Inhibitors Cough and Nausea And Vomiting  . Hydrocodone-Acetaminophen Nausea And Vomiting  . Albuterol Other (See Comments)    Caused SVT, but  pt is currently using it as needed   Review of Systems  Unable to perform ROS: Intubated    Physical Exam Constitutional:      Appearance: She is obese. She is ill-appearing.     Interventions: She is intubated.  Cardiovascular:     Rate and Rhythm: Normal rate.  Pulmonary:     Effort: She is intubated.  Skin:    General: Skin is warm and dry.     Vital Signs: BP (!) 77/57   Pulse 87   Temp (!) 97 F (36.1 C)   Resp 18   Ht _0  (1.575 m)   Wt 117.3 kg   SpO2 (!) 87%   BMI 47.30 kg/m  Pain Scale: CPOT   Pain Score: 0-No pain   SpO2: SpO2: (!) 87 % O2 Device:SpO2: (!) 87 % O2 Flow Rate: .O2 Flow Rate (L/min): 4 L/min  IO: Intake/output summary:   Intake/Output Summary (Last 24 hours) at December 02, 2020 1158 Last data filed at 2020-12-02 1100 Gross per 24 hour  Intake 3269.69 ml  Output 5438 ml  Net -2168.31 ml    LBM: Last BM Date:  (PTA) Baseline Weight: Weight: 118.8 kg Most recent weight: Weight: 117.3 kg     Palliative Assessment/Data:   Discussed with Dr Haroldine Laws and bedside RN Aaron Edelman  Time In: 1200 Time Out: 1310  Time Total: 70 minutes Greater than 50%  of this time was spent counseling and coordinating care related to the above assessment and plan.  Signed by: Wadie Lessen, NP   Please contact Palliative Medicine Team phone at [phone] for questions and concerns.  For individual provider: See Shea Evans

## 2020-11-07 NOTE — Progress Notes (Signed)
LTM EEG discontinued - no skin breakdown at unhook.   

## 2020-11-07 NOTE — Progress Notes (Signed)
   2020-11-10 1100  Clinical Encounter Type  Visited With Patient not available;Health care provider  Visit Type Critical Care  Referral From Nurse  Consult/Referral To Chaplain  The chaplain visited and spoke with the nurse Aaron Edelman) and informed him that I would be available for the family meeting if needed. The chaplain will follow up as needed.

## 2020-11-07 NOTE — Progress Notes (Signed)
  Patient well known to me from Clinic. Plan of care d/w CCM at length this am.   I met with family in the presence of Wadie Lessen, RN from Palliative Care team.   Discussed situation with her family. Updated them on multi-system organ failure and very small chance of meaningful recovery with end-stage RV failure and progressive renal failure. Baseline QOL very poor and she has stated many times that she would not want to be maintained on life support.  Family clear that she would not want to go on like this.  Will plan switch to comfort care and proceed with terminal extubation this afternoon.   Given ongoing diffuse myoclonic jerking will add Versed and increase fentanyl.   Additional CCT 45 mins.   Glori Bickers, MD  12:25 PM

## 2020-11-07 NOTE — Progress Notes (Signed)
NAME:  [name], MRN:  [id number], DOB:  05/02/1956, LOS: 3 ADMISSION DATE:  10/20/2020, CONSULTATION DATE: 10/31/2020 REFERRING MD: [facility], CHIEF COMPLAINT: Generalized failure to thrive congestive heart failure coupled with worsening renal failure.  Brief History   64 year old female with a plethora of health issues and generalized failure to thrive for the last 2 to 3 months.  History of present illness   64 year old female with a plethora of health issues which are well documented below and are significant for congestive heart failure diastolic systolic, chronic hypoxic respiratory on 3 L nasal cannula continuously documented OSA not sure if she is on CPAP, chronic kidney disease with elevated creatinine 3.8 her baseline is 1.9.  She presented with increasing respiratory distress volume overload treated with IV diuresis.  She had poor venous access pulmonary critical care was asked to attempt to place a central line in her 10/31/2020 and on examination she was found to be in a seizure activity.  The family was extremely distraught long conversation per Dr. Tamala Julian and NP Minor concerning goals of care.  She is stated she would not want to be intubated nor have dialysis in the future.  We have offered 3 different pathways 1 being comfort care without further acute interventions.  Second to be attempted intubation central line placement with aggressive diuresis using inotropes as an assist.  Should this fail then she will go to option #1.  Option #3 would be intubation tracheostomy if needed hemodialysis for long-term hemodialysis which they stated they do not want.  Due to the severity of her illness pulmonary critical care will assume her care at this time unless a course of comfort care is assumed.  Past Medical History   Past Medical History:  Diagnosis Date  . Anxiety    doesn't take any meds for this  . Arteriosclerosis of coronary artery 08/31/2011   Myoview 9/12: EF 44, no  ischemia // Myoview 8/18: EF 57, low risk, possible small area of inferior ischemia // Minor nonobstructive CAD by cardiac catheterization - LHC 8/18: Proximal LAD 35, OM2 25  . Arthritis    knuckles  . Asthma   . Carotid artery disease ([place])    Carotid US 7/14: Bilateral ICA 40-59 // Carotid US ([facility]) 2/17: bilat plaque without sig stenosis  . Chronic diastolic CHF (congestive heart failure) ([place]) 07/26/2017   Echo 7/18:Severe conc LVH, EF 60-65, no RWMA, Gr 2 DD, MAC, mild MR // right and left heart cath 8/18: LVEDP 21  . Claustrophobia 07/26/2012  . COPD (chronic obstructive pulmonary disease) ([place])    "chronic bronchitis - about every winter"  . CSF leak    dizziness/headache - assoc symptoms // 2/2 encephalocele s/p skull base reconstruction  . Diverticulosis   . Dyspnea   . History of CVA (cerebrovascular accident) 03/2004   denies residual 07/26/2012  . Hx of gout   . Hyperlipidemia    takes Pravastatin daily  . Hypertensive heart disease with chronic diastolic congestive heart failure ([place]) 07/26/2017  . Internal hemorrhoids   . Kidney stones   . LVH (left ventricular hypertrophy)    Echo 8/18: Severe conc LVH, EF 60-65, no RWMA, Gr 2 DD, MAC, mild MR  . OSA (obstructive sleep apnea) 07/26/2012   prior CPAP - stopped due to being primary caregiver for dying parent (stopped in 2016)  . PSVT (paroxysmal supraventricular tachycardia) ([place])   . Pulmonary hypertension, unspecified ([place]) 08/14/2017   cath 07/2017 >>PASP 69m Hg, PA  mean 39 mm Hg  . Type II diabetes mellitus ([place])      Significant Hospital Events   10/31/2020 documented seizure  Consults:  10/31/2020 pulmonary critical care  Procedures:    Significant Diagnostic Tests:  11/01/2019 echo > EF of 40 to 45% with global hypokinesis, RV with moderate reduced function  Micro Data:  COVID 11/23  > negative MRSA PCR 11/23  > negative Respiratory culture 11/24  >  Urine culture 11/24  > pansensitive E. coli    Antimicrobials:  10/22/2020 ceftriaxone for presumed urinary tract infection>>  Interim history/subjective:  Sedated on ventilator RN reports brief episode of VT overnight Myoclonic seizures seen on overnight EEG  Objective   Blood pressure 106/62, pulse 94, temperature (!) 97.3 F (36.3 C), resp. rate 18, height _0  (1.575 m), weight 117.3 kg, SpO2 (!) 89 %. CVP:  [12 mmHg-19 mmHg] 17 mmHg  Vent Mode: PRVC FiO2 (%):  [80 %] 80 % Set Rate:  [18 bmp] 18 bmp Vt Set:  [400 mL] 400 mL PEEP:  [10 cmH20] 10 cmH20 Plateau Pressure:  [21 cmH20-27 cmH20] 21 cmH20   Intake/Output Summary (Last 24 hours) at 2020/11/29 0160 Last data filed at Nov 29, 2020 0900 Gross per 24 hour  Intake 3274.11 ml  Output 5551 ml  Net -2276.89 ml   Filed Weights   10/31/20 0333 11/01/20 0446 11/29/2020 0444  Weight: 115.9 kg 117 kg 117.3 kg   CVP:  [12 mmHg-19 mmHg] 17 mmHg  Examination: General: acute on chronic ill-appearing elderly female lying in bed on mechanical ventilation in no acute distress HEENT: ETT, MM pink/moist, PERRL,  Neuro: Sedated on ventilator, flicker of facial grimacing to pain spontaneous right upper extremity movement CV: s1s2 regular rate and rhythm, no murmur, rubs, or gallops,  PULM: Clear to auscultation bilaterally, no increased work of breathing, tolerating ventilator well GI: soft, bowel sounds active in all 4 quadrants, non-tender, non-distended, tolerating TF Extremities: warm/dry, generalized nonpitting edema edema  Skin: no rashes or lesions  Resolved Hospital Problem list     Assessment & Plan:  Acute toxic metabolic encephalopathy -Multifactorial including critical illness, uremia, and poor perfusion P: Pad protocol Delirium precautions Minimize sedation as able  Myoclonic movement with intermittent AMS  -EEG overnight of 11/25 revealed multiple myoclonic seizures -Concern for provoked seizures in the setting of gabapentin toxicity P: Neurology  following, appreciate assistance Continue Keppra empirically Seizure precautions As needed benzodiazepines as needed for seizure activity  Acute Hypoxic / Hypercapnic Respiratory Failure  Groups 2/3 pulmonary HTN (OSA, OHS, diastolic heart failure) -Electively intubated 11/23 for further aggressive management of biventricular heart failure and progressing CKD P: Continue ventilator support with lung protective strategies  Wean PEEP and FiO2 for sats greater than 90%. Head of bed elevated 30 degrees. Plateau pressures less than 30 cm H20.  Follow intermittent chest x-ray and ABG.   SAT/SBT as tolerated, mentation preclude extubation  Ensure adequate pulmonary hygiene  Follow cultures  VAP bundle in place  PAD protocol  Acute on chronic kidney disease question cardiorenal vs. Advancing CKD -Endorsed no desire for long-term dialysis additionally patient remains poor candidate for long-term dialysis giving decreased functional status, obesity, and severe RV failure P: Nephrology following, appreciate assistance Continue CRRT per nephrology Volume removal per nephrology Avoid nephrotoxins Strict monitoring of intake and output Goals of care discussion as below  Biventricular decompensated systolic and diastolic heart failure -Echo this admission with EF 40 to 45% and moderate RV dysfunction as well, cath 8/20  with minimal CAD P: Heart failure following, appreciate assistance Failed high-dose diuretic therapy Continue milrinone Continue volume removal per CRRT Palliative care consulted  UTI -Urinalysis on admission significant for small hemoglobin, protein, large leukocyte Estrace, and many bacteria. P: Urine culture positive for pansensitive E. Coli  Goals of care Failure to thrive -Heart failure has consulted palliative care today, will coordinate with palliative care to establish goals of care discussion with family  Best practice (evaluated daily)   Diet:  NPO Pain/Anxiety/Delirium protocol (if indicated): Not indicated VAP protocol (if indicated): If intubated DVT prophylaxis: In place GI prophylaxis: PPI Glucose control: Sliding scale insulin protocol Mobility: Bedrest last date of multidisciplinary goals of care discussion: 10/31/2020 niece and sister updated at bedside by RN, NP, Dr. Tamala Julian Family and staff present S. minor ACNP, Dr. Tamala Julian.  Bedside RN Summary of discussion: ongoing family discussions concerning CODE STATUS Follow up goals of care discussion pending Code Status full code Disposition: ICU  Labs   CBC: Recent Labs  Lab 10/22/2020 1611 10/31/20 0741 10/31/20 1600 10/31/20 1956 11/01/20 0202 11/01/20 0425 11/26/2020 0442  WBC 7.1  --   --  10.2  --  15.2* 13.7*  HGB 11.9*   < > 12.2 12.1 13.9 12.1 11.0*  HCT 41.1   < > 36.0 41.7 41.0 42.1 37.7  MCV 93.4  --   --  92.1  --  92.5 94.0  PLT 209  --   --  239  --  247 192   < > = values in this interval not displayed.    Basic Metabolic Panel: Recent Labs  Lab 10/15/2020 2328 10/31/20 0741 10/31/20 1734 11/01/20 0051 11/01/20 0202 11/01/20 0425 11/01/20 1110 11/01/20 1617 26-Nov-2020 0442  NA 137   < > 139  --  139 139  --  136 136  K 4.2   < > 4.1  --  3.8 3.8  --  4.6 4.0  CL 98  --  96*  --   --  100  --  99 98  CO2 23  --  28  --   --  27  --  26 26  GLUCOSE 168*  --  144*  --   --  128*  --  110* 123*  BUN 55*  --  51*  --   --  31*  --  21 14  CREATININE 4.81*  --  4.68*  --   --  2.89*  --  2.04* 1.78*  CALCIUM 7.8*  --  7.8*  --   --  7.7*  --  7.8* 8.0*  MG  --   --   --  2.3  --  2.2  --   --  2.3  PHOS  --   --  6.5*  --   --  3.9 3.3 3.4 2.8   < > = values in this interval not displayed.   GFR: Estimated Creatinine Clearance: 39.3 mL/min (A) (by C-G formula based on SCr of 1.78 mg/dL (H)). Recent Labs  Lab 10/24/2020 1611 10/31/20 1956 11/01/20 0425 2020/11/26 0442  WBC 7.1 10.2 15.2* 13.7*    Liver Function Tests: Recent Labs  Lab  10/16/2020 2146 10/31/20 1734 11/01/20 0425 11/01/20 1617 2020/11/26 0442  AST 18  --   --   --   --   ALT 8  --   --   --   --   ALKPHOS 72  --   --   --   --  BILITOT 1.4*  --   --   --   --   PROT 7.2  --   --   --   --   ALBUMIN 3.9 3.7 3.6 3.2* 3.1*   No results for input(s): LIPASE, AMYLASE in the last 168 hours. Recent Labs  Lab 10/31/20 1956 11/01/20 1200  AMMONIA 21 23    ABG    Component Value Date/Time   PHART 7.275 (L) 11/01/2020 0202   PCO2ART 61.5 (H) 11/01/2020 0202   PO2ART 106 11/01/2020 0202   HCO3 29.9 (H) 11/01/2020 0202   TCO2 32 11/01/2020 0202   ACIDBASEDEF 0.8 12/20/2008 0130   O2SAT 86.0 Nov 28, 2020 0418     Coagulation Profile: No results for input(s): INR, PROTIME in the last 168 hours.  Cardiac Enzymes: No results for input(s): CKTOTAL, CKMB, CKMBINDEX, TROPONINI in the last 168 hours.  HbA1C: Hgb A1c MFr Bld  Date/Time Value Ref Range Status  10/12/2020 02:50 PM 6.1 (H) <5.7 % of total Hgb Final    Comment:    For someone without known diabetes, a hemoglobin  A1c value between 5.7% and 6.4% is consistent with prediabetes and should be confirmed with a  follow-up test. . For someone with known diabetes, a value <7% indicates that their diabetes is well controlled. A1c targets should be individualized based on duration of diabetes, age, comorbid conditions, and other considerations. . This assay result is consistent with an increased risk of diabetes. . Currently, no consensus exists regarding use of hemoglobin A1c for diagnosis of diabetes for children. Marland Kitchen   04/16/2020 11:01 PM 6.3 (H) 4.8 - 5.6 % Final    Comment:    (NOTE) Pre diabetes:          5.7%-6.4% Diabetes:              >6.4% Glycemic control for   <7.0% adults with diabetes     CBG: Recent Labs  Lab 11/01/20 1133 11/01/20 1546 11/01/20 1930 11/01/20 2355 Nov 28, 2020 0339  GLUCAP 125* 114* 101* 114* 119*    CRITICAL CARE Performed by: Johnsie Cancel  Total critical care time: 42 minutes  Critical care time was exclusive of separately billable procedures and treating other patients.  Critical care was necessary to treat or prevent imminent or life-threatening deterioration.  Critical care was time spent personally by me on the following activities: development of treatment plan with patient and/or surrogate as well as nursing, discussions with consultants, evaluation of patient's response to treatment, examination of patient, obtaining history from patient or surrogate, ordering and performing treatments and interventions, ordering and review of laboratory studies, ordering and review of radiographic studies, pulse oximetry, re-evaluation of patient's condition and participation in multidisciplinary rounds.  Johnsie Cancel, NP-C [facility] Contact / Pager information can be found on Amion  11/28/20, 9:47 AM

## 2020-11-07 NOTE — Progress Notes (Signed)
Subjective: Patient started having myoclonic seizures.  Sister and niece at bedside today.  Plan for family meeting.  ROS: Unable to obtain due to poor mental status  Examination  Vital signs in last 24 hours: Temp:  [96.6 F (35.9 C)-98.1 F (36.7 C)] 97 F (36.1 C) (11/26 1100) Pulse Rate:  [76-95] 87 (11/26 1100) Resp:  [15-29] 18 (11/26 1100) BP: (77-141)/(41-107) 77/57 (11/26 1100) SpO2:  [87 %-97 %] 87 % (11/26 1100) FiO2 (%):  [80 %] 80 % (11/26 0802) Weight:  [117.3 kg] 117.3 kg (11/26 0444)  General: lying in bed, not in apparent distress CVS: pulse-normal rate and rhythm RS: intubated, coarse breath sounds bilaterally Extremities: normal, warm petechiae left hand  Neuro: does not open eyes to noxious stimuli, pupils equally round reactive, corneal reflex intact, gag reflex intact, does not withdraw to noxious stimuli in extremities, noted to have intermittent eye opening and bilateral upper and lower extremity myoclonic jerks  Basic Metabolic Panel: Recent Labs  Lab 11/06/2020 2328 10/31/20 0741 10/31/20 1734 10/31/20 1734 11/01/20 0051 11/01/20 0202 11/01/20 0425 11/01/20 1110 11/01/20 1617 Nov 11, 2020 0442  NA 137   < > 139  --   --  139 139  --  136 136  K 4.2   < > 4.1  --   --  3.8 3.8  --  4.6 4.0  CL 98  --  96*  --   --   --  100  --  99 98  CO2 23  --  28  --   --   --  27  --  26 26  GLUCOSE 168*  --  144*  --   --   --  128*  --  110* 123*  BUN 55*  --  51*  --   --   --  31*  --  21 14  CREATININE 4.81*  --  4.68*  --   --   --  2.89*  --  2.04* 1.78*  CALCIUM 7.8*  --  7.8*   < >  --   --  7.7*  --  7.8* 8.0*  MG  --   --   --   --  2.3  --  2.2  --   --  2.3  PHOS  --   --  6.5*  --   --   --  3.9 3.3 3.4 2.8   < > = values in this interval not displayed.    CBC: Recent Labs  Lab 10/26/2020 1611 10/31/20 0741 10/31/20 1600 10/31/20 1956 11/01/20 0202 11/01/20 0425 November 11, 2020 0442  WBC 7.1  --   --  10.2  --  15.2* 13.7*  HGB 11.9*   < >  12.2 12.1 13.9 12.1 11.0*  HCT 41.1   < > 36.0 41.7 41.0 42.1 37.7  MCV 93.4  --   --  92.1  --  92.5 94.0  PLT 209  --   --  239  --  247 192   < > = values in this interval not displayed.     Coagulation Studies: No results for input(s): LABPROT, INR in the last 72 hours.  Imaging No new brain imaging overnight  ASSESSMENT AND PLAN: 64 year old female with multiple comorbidities as noted in HPI now with myoclonic seizures  Myoclonic seizures -Myoclonic seizures in the setting of renal dysfunction. At this point it is unlikely that its only due to gabapentin toxicity.  Possibly due to ceftriaxone toxicity in  the setting of renal dysfunction versus myoclonic seizures due to anoxia/hypoxia while patient briefly self-extubated.  Recommendations: -Okay to discontinue LTM EEG -We will start patient on Klonopin 0.5 mg 3 times daily.  Increase to 1 mg 3 times daily if needed -Family meeting to discuss aggressive management versus comfort care -We will consider MRI brain without contrast to look for anoxic/hypoxic injury at 72 hours if seizures continue -Seizure precaution -As needed IV Ativan 2 mg for clinical seizure lasting more than 2 minutes -Management of rest of comorbidities per primary team  CRITICAL CARE Performed by: Lora Havens   Total critical care time: 35 minutes  Critical care time was exclusive of separately billable procedures and treating other patients.  Critical care was necessary to treat or prevent imminent or life-threatening deterioration.  Critical care was time spent personally by me on the following activities: development of treatment plan with patient and/or surrogate as well as nursing, discussions with consultants, evaluation of patient's response to treatment, examination of patient, obtaining history from patient or surrogate, ordering and performing treatments and interventions, ordering and review of laboratory studies, ordering and review of  radiographic studies, pulse oximetry and re-evaluation of patient's condition.   Zeb Comfort Epilepsy [facility] For questions after 5pm please refer to AMION to reach the Neurologist on call

## 2020-11-07 NOTE — Progress Notes (Signed)
  Once family gathered at bedside, patient taken off vasoactive drips and bolused with versed and fentanyl to ensure comfort.   I then personally extubated her and she quickly bemae apneic. After approximately 15-20 minutes patient developed asystole.   Pupils fixed and dilated. No heart or lung sounds on auscultation.   Patient pronounced at 156pm.   Additional CCT 30 mins,  Glori Bickers, MD  2:02 PM

## 2020-11-07 DEATH — deceased

## 2020-11-15 ENCOUNTER — Encounter (HOSPITAL_COMMUNITY): Payer: Medicare Other

## 2020-11-21 DIAGNOSIS — J449 Chronic obstructive pulmonary disease, unspecified: Secondary | ICD-10-CM | POA: Diagnosis not present

## 2020-12-08 NOTE — Death Summary Note (Addendum)
  Advanced Heart Failure Death Summary  Death Summary   Patient ID: [name] MRN: [id number], DOB/AGE: September 01, 1956 65 y.o. Admit date: 10/16/2020 D/C date:     2020/11/15   Primary Discharge Diagnoses:  Acute on Chronic Diastolic/End-stage RV failure Due to NICM AKI on CKD IIIb/IV requiring CVVH Acute on chronic hypoxic respiratory failure requiring intubation  Acute Metabolic Encephalopathy  Myoclonic Seizures  UTI Pulmonary Arterial Hypertension (mild-moderate) Paroxysmal Atrial Fibrillation  Type 2DM   Hospital Course:  65 y/o female admitted with acute on chronic HF with mid range EF and RV failure (EF 45-50% on 1/21 echo).  She had been getting home IV Lasix but developed progressive volume overload and AKI on CKD stage 3.  She was empirically started on milrinone 0.125 and got Lasix 160 mg IV x 2 doses with minimal response.  She was confused with mental status changes, head CT was negative. EEG showed multiple myoclonic seizures.  She was intubated for low oxygen saturation and to control airway with altered MS.  CVVH started given minimal response to high dose IV Lasix and progressive renal failure with confusion. Required NE to support BP through CVVH. Not felt to be a long-term HD candidate given size and severe RV failure. Family requested meeting to discuss [place]. Given multi-system organ failure and very small chance of meaningful recovery with end-stage RV failure and progressive renal failure and poor QOL at baseline, decision was made to switch to comfort care. Pt was terminally extubated and expired 11/15/20.  Consultations  Pulmonary & Critical Care Medicine Nephrology  Neurology  Palliative Care   Duration of Discharge Encounter: Greater than 35 minutes   Signed, Lyda Jester, PA-C 11/15/2020, 8:58 AM  Agree with above.   Glori Bickers, MD  7:44 PM

## 2021-01-24 ENCOUNTER — Ambulatory Visit: Payer: Medicare Other | Admitting: Nurse Practitioner

## 2021-02-19 IMAGING — DX DG CHEST 2V
2 series · 2 of 2 positions shown · non-contrast
Comparison: Chest radiograph dated 01/05/2019

CLINICAL DATA: 63-year-old female with chest pain and shortness of
breath.

EXAM:
CHEST - 2 VIEW

[chest pa]
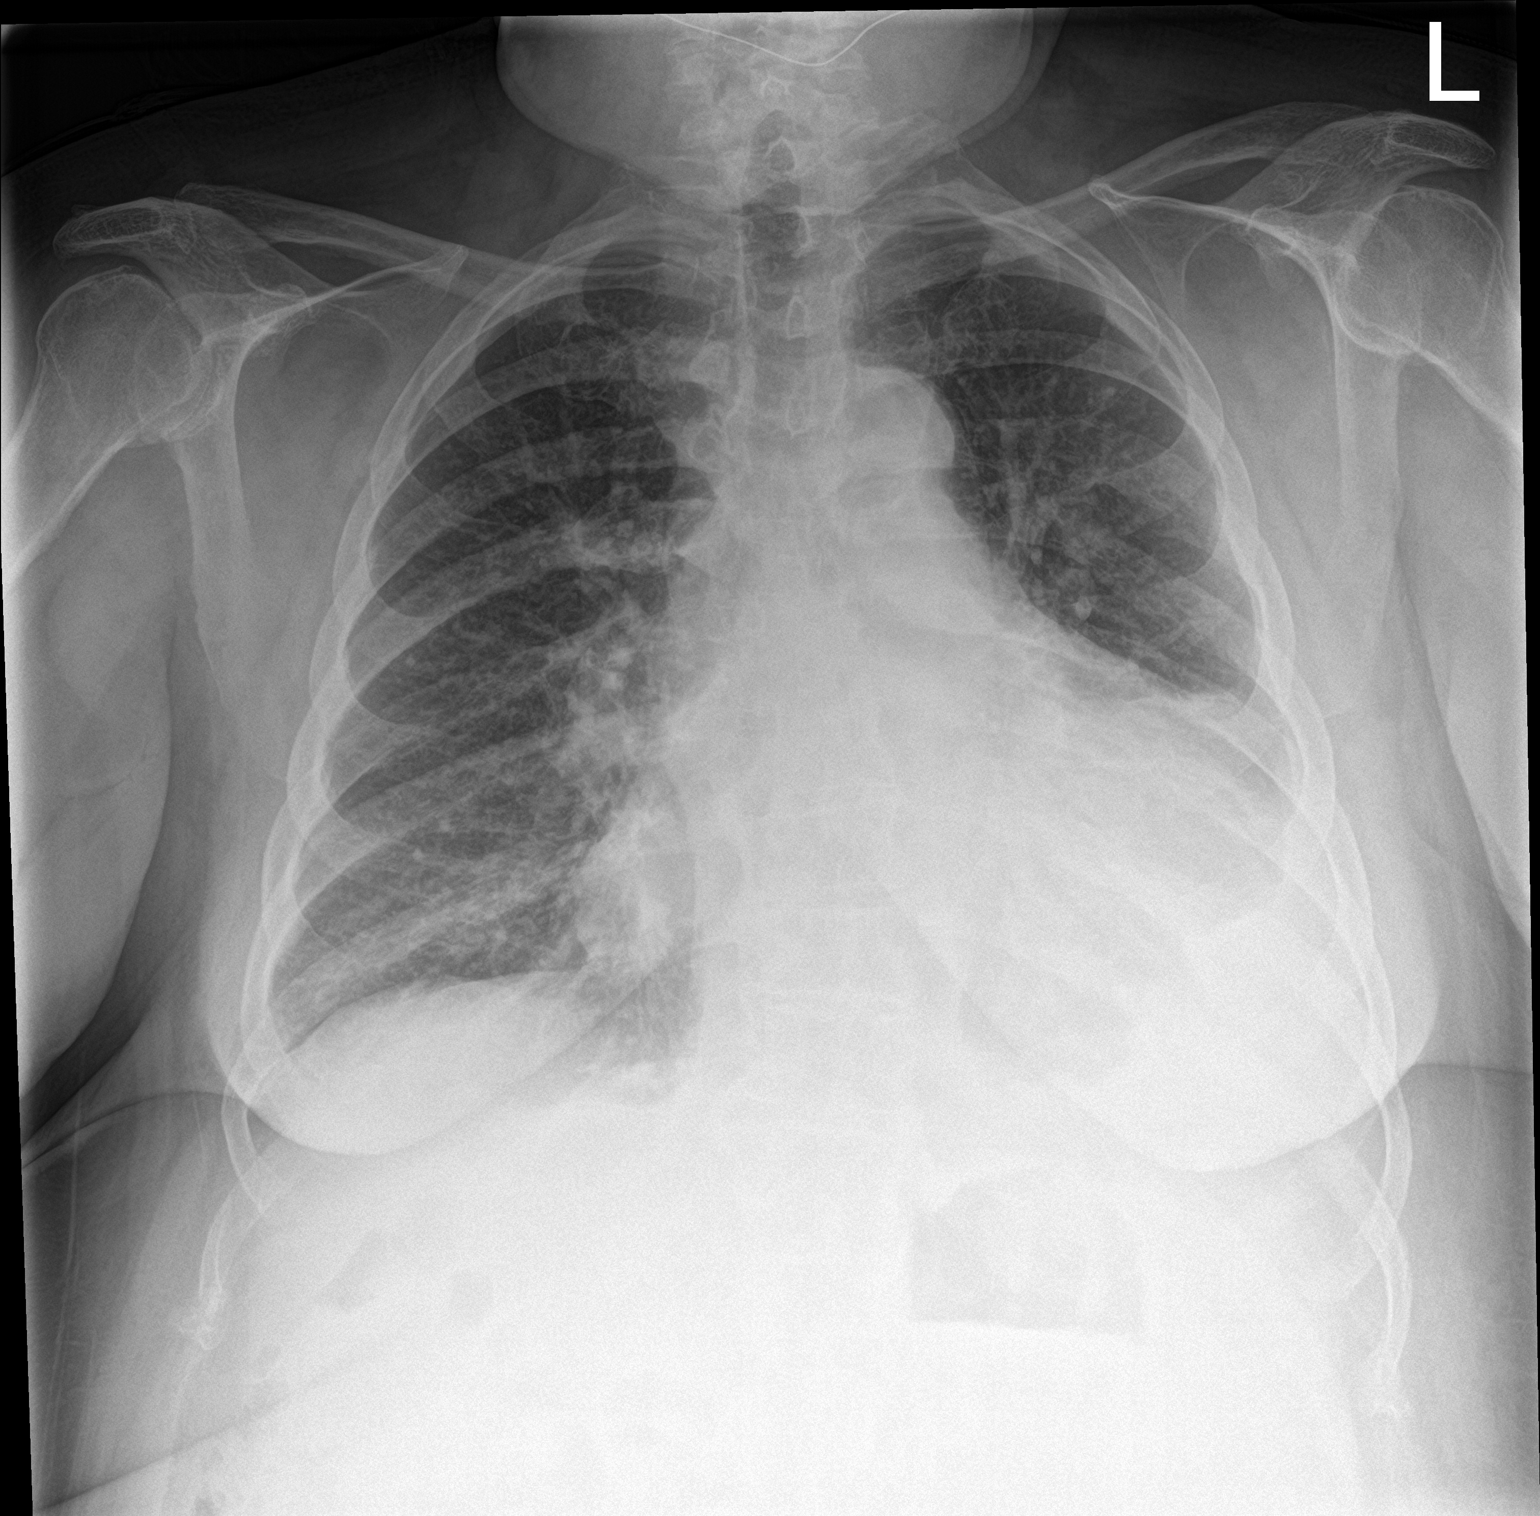

[chest lat]
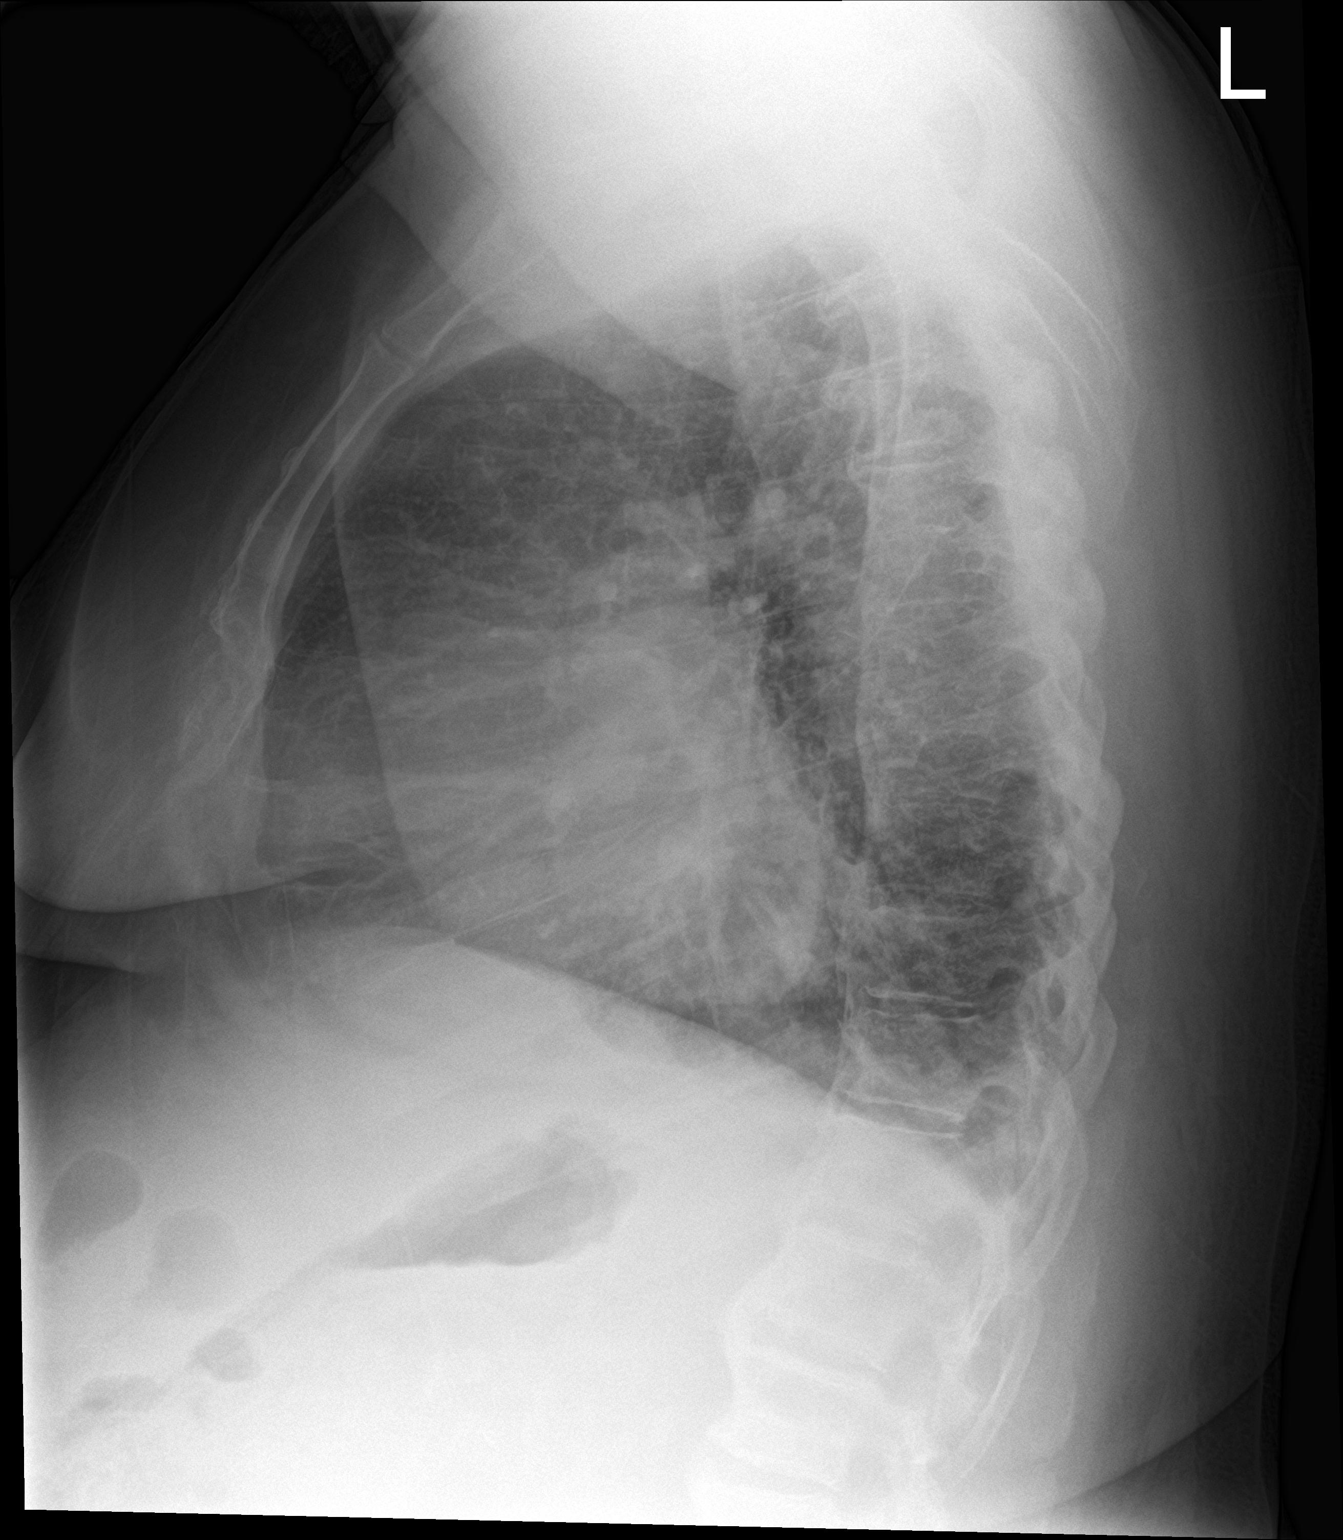

[2 of 2 positions shown; findings below may reference images not displayed]

FINDINGS: Small left pleural effusion and left lung base atelectasis versus
infiltrate. Overall slight interval increase in the size of the left
pleural effusion and associated left lung base opacity compared to
the prior radiograph. The right lung remains clear. No pneumothorax.
Stable cardiac silhouette. No acute osseous pathology. Degenerative
changes of the spine.
IMPRESSION: Small left pleural effusion and left lung base atelectasis versus
infiltrate, increased in size since the prior radiograph.
